# Patient Record
Sex: Male | Born: 1950
Health system: Southern US, Community
[De-identification: ages and names within clinical notes are randomized; demographics above are authoritative.]

## PROBLEM LIST (undated history)

## (undated) ENCOUNTER — Ambulatory Visit: Admission: EM | Payer: Federal, State, Local not specified - PPO | Source: Home / Self Care

## (undated) DIAGNOSIS — K219 Gastro-esophageal reflux disease without esophagitis: Secondary | ICD-10-CM

## (undated) DIAGNOSIS — S46011A Strain of muscle(s) and tendon(s) of the rotator cuff of right shoulder, initial encounter: Secondary | ICD-10-CM

## (undated) DIAGNOSIS — Z531 Procedure and treatment not carried out because of patient's decision for reasons of belief and group pressure: Secondary | ICD-10-CM

## (undated) DIAGNOSIS — J189 Pneumonia, unspecified organism: Secondary | ICD-10-CM

## (undated) DIAGNOSIS — E785 Hyperlipidemia, unspecified: Secondary | ICD-10-CM

## (undated) DIAGNOSIS — F32A Depression, unspecified: Secondary | ICD-10-CM

## (undated) DIAGNOSIS — Z9989 Dependence on other enabling machines and devices: Secondary | ICD-10-CM

## (undated) DIAGNOSIS — R079 Chest pain, unspecified: Secondary | ICD-10-CM

## (undated) DIAGNOSIS — M1990 Unspecified osteoarthritis, unspecified site: Secondary | ICD-10-CM

## (undated) DIAGNOSIS — D649 Anemia, unspecified: Secondary | ICD-10-CM

## (undated) DIAGNOSIS — M545 Low back pain, unspecified: Secondary | ICD-10-CM

## (undated) DIAGNOSIS — C61 Malignant neoplasm of prostate: Secondary | ICD-10-CM

## (undated) DIAGNOSIS — E119 Type 2 diabetes mellitus without complications: Secondary | ICD-10-CM

## (undated) DIAGNOSIS — G4733 Obstructive sleep apnea (adult) (pediatric): Secondary | ICD-10-CM

## (undated) DIAGNOSIS — F329 Major depressive disorder, single episode, unspecified: Secondary | ICD-10-CM

## (undated) DIAGNOSIS — I2510 Atherosclerotic heart disease of native coronary artery without angina pectoris: Secondary | ICD-10-CM

## (undated) DIAGNOSIS — M12811 Other specific arthropathies, not elsewhere classified, right shoulder: Secondary | ICD-10-CM

## (undated) DIAGNOSIS — D126 Benign neoplasm of colon, unspecified: Secondary | ICD-10-CM

## (undated) DIAGNOSIS — I209 Angina pectoris, unspecified: Secondary | ICD-10-CM

## (undated) DIAGNOSIS — IMO0001 Reserved for inherently not codable concepts without codable children: Secondary | ICD-10-CM

## (undated) DIAGNOSIS — S83232A Complex tear of medial meniscus, current injury, left knee, initial encounter: Secondary | ICD-10-CM

## (undated) DIAGNOSIS — I10 Essential (primary) hypertension: Secondary | ICD-10-CM

## (undated) DIAGNOSIS — G8929 Other chronic pain: Secondary | ICD-10-CM

## (undated) HISTORY — DX: Other specific arthropathies, not elsewhere classified, right shoulder: M12.811

## (undated) HISTORY — DX: Obstructive sleep apnea (adult) (pediatric): Z99.89

## (undated) HISTORY — PX: PILONIDAL CYST EXCISION: SHX744

## (undated) HISTORY — DX: Anemia, unspecified: D64.9

## (undated) HISTORY — DX: Hyperlipidemia, unspecified: E78.5

## (undated) HISTORY — PX: COLONOSCOPY W/ BIOPSIES AND POLYPECTOMY: SHX1376

## (undated) HISTORY — PX: ESOPHAGOGASTRODUODENOSCOPY ENDOSCOPY: SHX5814

## (undated) HISTORY — DX: Chest pain, unspecified: R07.9

## (undated) HISTORY — DX: Essential (primary) hypertension: I10

## (undated) HISTORY — DX: Major depressive disorder, single episode, unspecified: F32.9

## (undated) HISTORY — PX: CLOSED REDUCTION SHOULDER DISLOCATION: SUR242

## (undated) HISTORY — DX: Depression, unspecified: F32.A

## (undated) HISTORY — DX: Unspecified osteoarthritis, unspecified site: M19.90

## (undated) HISTORY — DX: Obstructive sleep apnea (adult) (pediatric): G47.33

## (undated) HISTORY — DX: Gastro-esophageal reflux disease without esophagitis: K21.9

## (undated) HISTORY — DX: Benign neoplasm of colon, unspecified: D12.6

## (undated) HISTORY — DX: Type 2 diabetes mellitus without complications: E11.9

---
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
## 1997-12-30 ENCOUNTER — Encounter: Admission: RE | Admit: 1997-12-30 | Discharge: 1998-03-30 | Payer: Self-pay | Admitting: Family Medicine

## 2000-06-22 ENCOUNTER — Encounter: Admission: RE | Admit: 2000-06-22 | Discharge: 2000-06-22 | Payer: Self-pay | Admitting: Surgery

## 2000-06-22 ENCOUNTER — Encounter: Payer: Self-pay | Admitting: Surgery

## 2000-06-24 ENCOUNTER — Ambulatory Visit (HOSPITAL_BASED_OUTPATIENT_CLINIC_OR_DEPARTMENT_OTHER): Admission: RE | Admit: 2000-06-24 | Discharge: 2000-06-24 | Payer: Self-pay | Admitting: Surgery

## 2000-07-02 ENCOUNTER — Emergency Department (HOSPITAL_COMMUNITY): Admission: EM | Admit: 2000-07-02 | Discharge: 2000-07-02 | Payer: Self-pay

## 2001-11-09 ENCOUNTER — Ambulatory Visit (HOSPITAL_COMMUNITY): Admission: RE | Admit: 2001-11-09 | Discharge: 2001-11-09 | Payer: Self-pay | Admitting: Gastroenterology

## 2002-04-05 HISTORY — PX: CARDIAC CATHETERIZATION: SHX172

## 2003-03-11 ENCOUNTER — Ambulatory Visit (HOSPITAL_COMMUNITY): Admission: RE | Admit: 2003-03-11 | Discharge: 2003-03-11 | Payer: Self-pay | Admitting: Nephrology

## 2003-03-13 ENCOUNTER — Ambulatory Visit (HOSPITAL_COMMUNITY): Admission: RE | Admit: 2003-03-13 | Discharge: 2003-03-13 | Payer: Self-pay | Admitting: Nephrology

## 2005-03-21 ENCOUNTER — Observation Stay (HOSPITAL_COMMUNITY): Admission: EM | Admit: 2005-03-21 | Discharge: 2005-03-23 | Payer: Self-pay | Admitting: Emergency Medicine

## 2008-08-23 ENCOUNTER — Ambulatory Visit: Payer: Self-pay | Admitting: Internal Medicine

## 2008-08-23 DIAGNOSIS — K219 Gastro-esophageal reflux disease without esophagitis: Secondary | ICD-10-CM | POA: Insufficient documentation

## 2008-08-23 DIAGNOSIS — N138 Other obstructive and reflux uropathy: Secondary | ICD-10-CM | POA: Insufficient documentation

## 2008-08-23 DIAGNOSIS — E785 Hyperlipidemia, unspecified: Secondary | ICD-10-CM | POA: Insufficient documentation

## 2008-08-23 DIAGNOSIS — F0280 Dementia in other diseases classified elsewhere without behavioral disturbance: Secondary | ICD-10-CM | POA: Insufficient documentation

## 2008-08-23 DIAGNOSIS — D649 Anemia, unspecified: Secondary | ICD-10-CM | POA: Insufficient documentation

## 2008-08-23 DIAGNOSIS — I10 Essential (primary) hypertension: Secondary | ICD-10-CM | POA: Insufficient documentation

## 2008-08-23 DIAGNOSIS — F329 Major depressive disorder, single episode, unspecified: Secondary | ICD-10-CM

## 2008-08-23 DIAGNOSIS — N401 Enlarged prostate with lower urinary tract symptoms: Secondary | ICD-10-CM | POA: Insufficient documentation

## 2008-08-23 DIAGNOSIS — M1990 Unspecified osteoarthritis, unspecified site: Secondary | ICD-10-CM | POA: Insufficient documentation

## 2008-08-23 DIAGNOSIS — E1159 Type 2 diabetes mellitus with other circulatory complications: Secondary | ICD-10-CM | POA: Insufficient documentation

## 2008-08-23 LAB — CONVERTED CEMR LAB
ALT: 31 units/L (ref 0–53)
AST: 22 units/L (ref 0–37)
Albumin: 3.9 g/dL (ref 3.5–5.2)
Alkaline Phosphatase: 82 units/L (ref 39–117)
BUN: 19 mg/dL (ref 6–23)
Basophils Absolute: 0.1 10*3/uL (ref 0.0–0.1)
Basophils Relative: 0.8 % (ref 0.0–3.0)
Bilirubin Urine: NEGATIVE
Bilirubin, Direct: 0.1 mg/dL (ref 0.0–0.3)
CO2: 31 meq/L (ref 19–32)
Calcium: 8.7 mg/dL (ref 8.4–10.5)
Chloride: 107 meq/L (ref 96–112)
Cholesterol, target level: 200 mg/dL
Cholesterol: 223 mg/dL — ABNORMAL HIGH (ref 0–200)
Creatinine, Ser: 0.9 mg/dL (ref 0.4–1.5)
Creatinine,U: 194 mg/dL
Direct LDL: 172.3 mg/dL
Eosinophils Absolute: 0.2 10*3/uL (ref 0.0–0.7)
Eosinophils Relative: 3.1 % (ref 0.0–5.0)
Folate: 13.2 ng/mL
GFR calc non Af Amer: 111.27 mL/min (ref >60)
Glucose, Bld: 177 mg/dL — ABNORMAL HIGH (ref 70–99)
HCT: 40.8 % (ref 39.0–52.0)
HDL goal, serum: 40 mg/dL
HDL: 38 mg/dL — ABNORMAL LOW (ref >39.00)
Hemoglobin, Urine: NEGATIVE
Hemoglobin: 14 g/dL (ref 13.0–17.0)
Hgb A1c MFr Bld: 7.1 % — ABNORMAL HIGH (ref 4.6–6.5)
Iron: 85 ug/dL (ref 42–165)
Ketones, ur: NEGATIVE mg/dL
LDL Goal: 100 mg/dL
Leukocytes, UA: NEGATIVE
Lymphocytes Relative: 37.5 % (ref 12.0–46.0)
Lymphs Abs: 2.4 10*3/uL (ref 0.7–4.0)
MCHC: 34.3 g/dL (ref 30.0–36.0)
MCV: 86 fL (ref 78.0–100.0)
Microalb Creat Ratio: 3.1 mg/g (ref 0.0–30.0)
Microalb, Ur: 0.6 mg/dL (ref 0.0–1.9)
Monocytes Absolute: 0.6 10*3/uL (ref 0.1–1.0)
Monocytes Relative: 9.5 % (ref 3.0–12.0)
Neutro Abs: 3.1 10*3/uL (ref 1.4–7.7)
Neutrophils Relative %: 49.1 % (ref 43.0–77.0)
Nitrite: NEGATIVE
PSA: 1.52 ng/mL (ref 0.10–4.00)
Platelets: 195 10*3/uL (ref 150.0–400.0)
Potassium: 3.9 meq/L (ref 3.5–5.1)
RBC: 4.75 M/uL (ref 4.22–5.81)
RDW: 12.8 % (ref 11.5–14.6)
Saturation Ratios: 21.8 % (ref 20.0–50.0)
Sodium: 142 meq/L (ref 135–145)
Specific Gravity, Urine: >=1.03 (ref 1.000–1.030)
Total Bilirubin: 0.5 mg/dL (ref 0.3–1.2)
Total CHOL/HDL Ratio: 6
Total Protein, Urine: NEGATIVE mg/dL
Total Protein: 7.1 g/dL (ref 6.0–8.3)
Transferrin: 277.9 mg/dL (ref 212.0–360.0)
Triglycerides: 122 mg/dL (ref 0.0–149.0)
Urine Glucose: NEGATIVE mg/dL
Urobilinogen, UA: 0.2 (ref 0.0–1.0)
VLDL: 24.4 mg/dL (ref 0.0–40.0)
Vitamin B-12: 597 pg/mL (ref 211–911)
WBC: 6.4 10*3/uL (ref 4.5–10.5)
pH: 5 (ref 5.0–8.0)

## 2008-08-26 ENCOUNTER — Encounter: Payer: Self-pay | Admitting: Internal Medicine

## 2008-09-10 ENCOUNTER — Ambulatory Visit: Payer: Self-pay | Admitting: Internal Medicine

## 2008-11-11 ENCOUNTER — Ambulatory Visit: Payer: Self-pay | Admitting: Internal Medicine

## 2008-11-11 ENCOUNTER — Telehealth: Payer: Self-pay | Admitting: Internal Medicine

## 2008-11-11 DIAGNOSIS — N529 Male erectile dysfunction, unspecified: Secondary | ICD-10-CM | POA: Insufficient documentation

## 2008-11-11 DIAGNOSIS — E8881 Metabolic syndrome: Secondary | ICD-10-CM | POA: Insufficient documentation

## 2009-01-08 ENCOUNTER — Telehealth: Payer: Self-pay | Admitting: Internal Medicine

## 2009-02-04 ENCOUNTER — Telehealth: Payer: Self-pay | Admitting: Internal Medicine

## 2009-03-17 ENCOUNTER — Ambulatory Visit: Payer: Self-pay | Admitting: Internal Medicine

## 2009-03-31 ENCOUNTER — Telehealth (INDEPENDENT_AMBULATORY_CARE_PROVIDER_SITE_OTHER): Payer: Self-pay | Admitting: *Deleted

## 2009-04-05 DIAGNOSIS — D126 Benign neoplasm of colon, unspecified: Secondary | ICD-10-CM

## 2009-04-05 HISTORY — DX: Benign neoplasm of colon, unspecified: D12.6

## 2009-05-09 ENCOUNTER — Telehealth (INDEPENDENT_AMBULATORY_CARE_PROVIDER_SITE_OTHER): Payer: Self-pay | Admitting: *Deleted

## 2009-05-20 ENCOUNTER — Ambulatory Visit: Payer: Self-pay | Admitting: Internal Medicine

## 2009-05-20 LAB — CONVERTED CEMR LAB
ALT: 30 units/L (ref 0–53)
AST: 21 units/L (ref 0–37)
Albumin: 3.9 g/dL (ref 3.5–5.2)
Alkaline Phosphatase: 75 units/L (ref 39–117)
BUN: 10 mg/dL (ref 6–23)
Basophils Absolute: 0.1 10*3/uL (ref 0.0–0.1)
Basophils Relative: 0.9 % (ref 0.0–3.0)
Bilirubin Urine: NEGATIVE
Bilirubin, Direct: 0.2 mg/dL (ref 0.0–0.3)
CO2: 33 meq/L — ABNORMAL HIGH (ref 19–32)
Calcium: 9.1 mg/dL (ref 8.4–10.5)
Chloride: 103 meq/L (ref 96–112)
Cholesterol: 215 mg/dL — ABNORMAL HIGH (ref 0–200)
Creatinine, Ser: 1 mg/dL (ref 0.4–1.5)
Direct LDL: 162.3 mg/dL
Eosinophils Absolute: 0.3 10*3/uL (ref 0.0–0.7)
Eosinophils Relative: 3.9 % (ref 0.0–5.0)
GFR calc non Af Amer: 98.28 mL/min (ref >60)
Glucose, Bld: 88 mg/dL (ref 70–99)
HCT: 48.1 % (ref 39.0–52.0)
HDL: 43 mg/dL (ref >39.00)
Hemoglobin: 15.5 g/dL (ref 13.0–17.0)
Hgb A1c MFr Bld: 6.8 % — ABNORMAL HIGH (ref 4.6–6.5)
Ketones, ur: NEGATIVE mg/dL
Leukocytes, UA: NEGATIVE
Lymphocytes Relative: 41.5 % (ref 12.0–46.0)
Lymphs Abs: 2.8 10*3/uL (ref 0.7–4.0)
MCHC: 32.2 g/dL (ref 30.0–36.0)
MCV: 88 fL (ref 78.0–100.0)
Monocytes Absolute: 0.8 10*3/uL (ref 0.1–1.0)
Monocytes Relative: 12.3 % — ABNORMAL HIGH (ref 3.0–12.0)
Neutro Abs: 2.8 10*3/uL (ref 1.4–7.7)
Neutrophils Relative %: 41.4 % — ABNORMAL LOW (ref 43.0–77.0)
Nitrite: NEGATIVE
Platelets: 195 10*3/uL (ref 150.0–400.0)
Potassium: 4.5 meq/L (ref 3.5–5.1)
RBC: 5.46 M/uL (ref 4.22–5.81)
RDW: 12.7 % (ref 11.5–14.6)
Sodium: 141 meq/L (ref 135–145)
Specific Gravity, Urine: 1.025 (ref 1.000–1.030)
TSH: 1.68 microintl units/mL (ref 0.35–5.50)
Testosterone: 408.15 ng/dL (ref 350.00–890.00)
Total Bilirubin: 0.6 mg/dL (ref 0.3–1.2)
Total CHOL/HDL Ratio: 5
Total Protein, Urine: NEGATIVE mg/dL
Total Protein: 7.8 g/dL (ref 6.0–8.3)
Triglycerides: 91 mg/dL (ref 0.0–149.0)
Urine Glucose: NEGATIVE mg/dL
Urobilinogen, UA: 0.2 (ref 0.0–1.0)
VLDL: 18.2 mg/dL (ref 0.0–40.0)
WBC: 6.8 10*3/uL (ref 4.5–10.5)
pH: 5.5 (ref 5.0–8.0)

## 2009-07-31 ENCOUNTER — Telehealth: Payer: Self-pay | Admitting: Internal Medicine

## 2009-10-22 LAB — HM DIABETES EYE EXAM: HM Diabetic Eye Exam: NORMAL

## 2009-10-23 ENCOUNTER — Ambulatory Visit: Payer: Self-pay | Admitting: Internal Medicine

## 2009-10-23 ENCOUNTER — Telehealth: Payer: Self-pay | Admitting: Internal Medicine

## 2009-10-23 DIAGNOSIS — R9431 Abnormal electrocardiogram [ECG] [EKG]: Secondary | ICD-10-CM | POA: Insufficient documentation

## 2009-10-23 LAB — CONVERTED CEMR LAB
ALT: 21 units/L (ref 0–53)
AST: 21 units/L (ref 0–37)
Albumin: 3.8 g/dL (ref 3.5–5.2)
Alkaline Phosphatase: 63 units/L (ref 39–117)
BUN: 11 mg/dL (ref 6–23)
Basophils Absolute: 0.1 10*3/uL (ref 0.0–0.1)
Basophils Relative: 1 % (ref 0.0–3.0)
Bilirubin Urine: NEGATIVE
Bilirubin, Direct: 0.1 mg/dL (ref 0.0–0.3)
CO2: 32 meq/L (ref 19–32)
Calcium: 8.8 mg/dL (ref 8.4–10.5)
Chloride: 107 meq/L (ref 96–112)
Cholesterol: 204 mg/dL — ABNORMAL HIGH (ref 0–200)
Creatinine, Ser: 0.9 mg/dL (ref 0.4–1.5)
Direct LDL: 159.1 mg/dL
Eosinophils Absolute: 0.2 10*3/uL (ref 0.0–0.7)
Eosinophils Relative: 4.2 % (ref 0.0–5.0)
GFR calc non Af Amer: 110.82 mL/min (ref >60)
Glucose, Bld: 128 mg/dL — ABNORMAL HIGH (ref 70–99)
HCT: 40.8 % (ref 39.0–52.0)
HDL: 37.6 mg/dL — ABNORMAL LOW (ref >39.00)
Hemoglobin, Urine: NEGATIVE
Hemoglobin: 13.5 g/dL (ref 13.0–17.0)
Hgb A1c MFr Bld: 7 % — ABNORMAL HIGH (ref 4.6–6.5)
Ketones, ur: NEGATIVE mg/dL
Leukocytes, UA: NEGATIVE
Lymphocytes Relative: 40.9 % (ref 12.0–46.0)
Lymphs Abs: 2.3 10*3/uL (ref 0.7–4.0)
MCHC: 33.1 g/dL (ref 30.0–36.0)
MCV: 87.7 fL (ref 78.0–100.0)
Monocytes Absolute: 0.6 10*3/uL (ref 0.1–1.0)
Monocytes Relative: 9.9 % (ref 3.0–12.0)
Neutro Abs: 2.5 10*3/uL (ref 1.4–7.7)
Neutrophils Relative %: 44 % (ref 43.0–77.0)
Nitrite: NEGATIVE
PSA: 1.51 ng/mL (ref 0.10–4.00)
Platelets: 204 10*3/uL (ref 150.0–400.0)
Potassium: 4.3 meq/L (ref 3.5–5.1)
RBC: 4.65 M/uL (ref 4.22–5.81)
RDW: 14.6 % (ref 11.5–14.6)
Sodium: 143 meq/L (ref 135–145)
Specific Gravity, Urine: 1.025 (ref 1.000–1.030)
TSH: 0.98 microintl units/mL (ref 0.35–5.50)
Total Bilirubin: 0.7 mg/dL (ref 0.3–1.2)
Total CHOL/HDL Ratio: 5
Total Protein, Urine: NEGATIVE mg/dL
Total Protein: 6.9 g/dL (ref 6.0–8.3)
Triglycerides: 80 mg/dL (ref 0.0–149.0)
Urine Glucose: NEGATIVE mg/dL
Urobilinogen, UA: 0.2 (ref 0.0–1.0)
VLDL: 16 mg/dL (ref 0.0–40.0)
WBC: 5.6 10*3/uL (ref 4.5–10.5)
pH: 7 (ref 5.0–8.0)

## 2009-10-28 ENCOUNTER — Encounter (INDEPENDENT_AMBULATORY_CARE_PROVIDER_SITE_OTHER): Payer: Self-pay | Admitting: *Deleted

## 2009-11-18 ENCOUNTER — Ambulatory Visit: Payer: Self-pay | Admitting: Cardiovascular Disease

## 2009-11-25 ENCOUNTER — Ambulatory Visit (HOSPITAL_COMMUNITY): Admission: RE | Admit: 2009-11-25 | Discharge: 2009-11-25 | Payer: Self-pay | Admitting: Cardiovascular Disease

## 2009-11-25 ENCOUNTER — Ambulatory Visit: Payer: Self-pay | Admitting: Cardiovascular Disease

## 2009-11-26 ENCOUNTER — Encounter (INDEPENDENT_AMBULATORY_CARE_PROVIDER_SITE_OTHER): Payer: Self-pay | Admitting: *Deleted

## 2009-12-01 ENCOUNTER — Encounter (INDEPENDENT_AMBULATORY_CARE_PROVIDER_SITE_OTHER): Payer: Self-pay | Admitting: *Deleted

## 2009-12-01 ENCOUNTER — Ambulatory Visit: Payer: Self-pay | Admitting: Gastroenterology

## 2009-12-15 ENCOUNTER — Ambulatory Visit: Payer: Self-pay | Admitting: Gastroenterology

## 2009-12-15 LAB — HM COLONOSCOPY

## 2009-12-18 ENCOUNTER — Encounter: Payer: Self-pay | Admitting: Gastroenterology

## 2010-01-09 ENCOUNTER — Telehealth: Payer: Self-pay | Admitting: Internal Medicine

## 2010-01-09 ENCOUNTER — Encounter: Payer: Self-pay | Admitting: Internal Medicine

## 2010-01-15 ENCOUNTER — Telehealth: Payer: Self-pay | Admitting: Internal Medicine

## 2010-01-16 ENCOUNTER — Ambulatory Visit: Payer: Self-pay | Admitting: Internal Medicine

## 2010-01-16 ENCOUNTER — Ambulatory Visit: Payer: Self-pay | Admitting: Cardiology

## 2010-01-16 ENCOUNTER — Telehealth: Payer: Self-pay | Admitting: Internal Medicine

## 2010-01-16 DIAGNOSIS — S0990XA Unspecified injury of head, initial encounter: Secondary | ICD-10-CM | POA: Insufficient documentation

## 2010-01-16 LAB — CONVERTED CEMR LAB
ALT: 21 units/L (ref 0–53)
AST: 17 units/L (ref 0–37)
Albumin: 3.8 g/dL (ref 3.5–5.2)
Alkaline Phosphatase: 92 units/L (ref 39–117)
BUN: 14 mg/dL (ref 6–23)
Bilirubin, Direct: 0 mg/dL (ref 0.0–0.3)
CO2: 28 meq/L (ref 19–32)
Calcium: 9.3 mg/dL (ref 8.4–10.5)
Chloride: 104 meq/L (ref 96–112)
Creatinine, Ser: 1 mg/dL (ref 0.4–1.5)
GFR calc non Af Amer: 98.06 mL/min (ref >60)
Glucose, Bld: 158 mg/dL — ABNORMAL HIGH (ref 70–99)
Hgb A1c MFr Bld: 7.1 % — ABNORMAL HIGH (ref 4.6–6.5)
Potassium: 4.3 meq/L (ref 3.5–5.1)
Sed Rate: 8 mm/hr (ref 0–22)
Sodium: 140 meq/L (ref 135–145)
Total Bilirubin: 0.5 mg/dL (ref 0.3–1.2)
Total Protein: 7 g/dL (ref 6.0–8.3)

## 2010-01-29 ENCOUNTER — Encounter: Payer: Self-pay | Admitting: Internal Medicine

## 2010-03-16 ENCOUNTER — Telehealth (INDEPENDENT_AMBULATORY_CARE_PROVIDER_SITE_OTHER): Payer: Self-pay | Admitting: *Deleted

## 2010-04-21 ENCOUNTER — Encounter (INDEPENDENT_AMBULATORY_CARE_PROVIDER_SITE_OTHER): Payer: Self-pay | Admitting: *Deleted

## 2010-04-24 ENCOUNTER — Ambulatory Visit
Admission: RE | Admit: 2010-04-24 | Discharge: 2010-04-24 | Payer: Self-pay | Source: Home / Self Care | Attending: Internal Medicine | Admitting: Internal Medicine

## 2010-04-24 ENCOUNTER — Encounter: Payer: Self-pay | Admitting: Internal Medicine

## 2010-04-24 ENCOUNTER — Telehealth: Payer: Self-pay | Admitting: Internal Medicine

## 2010-04-24 ENCOUNTER — Other Ambulatory Visit: Payer: Self-pay | Admitting: Internal Medicine

## 2010-04-24 LAB — BASIC METABOLIC PANEL
BUN: 14 mg/dL (ref 6–23)
CO2: 28 mEq/L (ref 19–32)
Calcium: 9.1 mg/dL (ref 8.4–10.5)
Chloride: 105 mEq/L (ref 96–112)
Creatinine, Ser: 0.9 mg/dL (ref 0.4–1.5)
GFR: 110.63 mL/min (ref >60.00)
Glucose, Bld: 85 mg/dL (ref 70–99)
Potassium: 4.2 mEq/L (ref 3.5–5.1)
Sodium: 141 mEq/L (ref 135–145)

## 2010-04-24 LAB — LDL CHOLESTEROL, DIRECT: Direct LDL: 163.1 mg/dL

## 2010-04-24 LAB — LIPID PANEL
Cholesterol: 208 mg/dL — ABNORMAL HIGH (ref 0–200)
HDL: 41.9 mg/dL (ref >39.00)
Total CHOL/HDL Ratio: 5
Triglycerides: 68 mg/dL (ref 0.0–149.0)
VLDL: 13.6 mg/dL (ref 0.0–40.0)

## 2010-04-24 LAB — HM DIABETES FOOT EXAM

## 2010-04-24 LAB — CBC WITH DIFFERENTIAL/PLATELET
Basophils Absolute: 0.1 10*3/uL (ref 0.0–0.1)
Basophils Relative: 1.2 % (ref 0.0–3.0)
Eosinophils Absolute: 0.2 10*3/uL (ref 0.0–0.7)
Eosinophils Relative: 3 % (ref 0.0–5.0)
HCT: 45.4 % (ref 39.0–52.0)
Hemoglobin: 15.3 g/dL (ref 13.0–17.0)
Lymphocytes Relative: 39.5 % (ref 12.0–46.0)
Lymphs Abs: 2.7 10*3/uL (ref 0.7–4.0)
MCHC: 33.7 g/dL (ref 30.0–36.0)
MCV: 86.6 fl (ref 78.0–100.0)
Monocytes Absolute: 0.7 10*3/uL (ref 0.1–1.0)
Monocytes Relative: 10.2 % (ref 3.0–12.0)
Neutro Abs: 3.2 10*3/uL (ref 1.4–7.7)
Neutrophils Relative %: 46.1 % (ref 43.0–77.0)
Platelets: 216 10*3/uL (ref 150.0–400.0)
RBC: 5.24 Mil/uL (ref 4.22–5.81)
RDW: 14.5 % (ref 11.5–14.6)
WBC: 6.9 10*3/uL (ref 4.5–10.5)

## 2010-04-24 LAB — HEPATIC FUNCTION PANEL
ALT: 25 U/L (ref 0–53)
AST: 20 U/L (ref 0–37)
Albumin: 3.9 g/dL (ref 3.5–5.2)
Alkaline Phosphatase: 80 U/L (ref 39–117)
Bilirubin, Direct: 0.1 mg/dL (ref 0.0–0.3)
Total Bilirubin: 0.5 mg/dL (ref 0.3–1.2)
Total Protein: 7.2 g/dL (ref 6.0–8.3)

## 2010-04-24 LAB — HEMOGLOBIN A1C: Hgb A1c MFr Bld: 7 % — ABNORMAL HIGH (ref 4.6–6.5)

## 2010-04-24 LAB — TSH: TSH: 1.15 u[IU]/mL (ref 0.35–5.50)

## 2010-05-05 NOTE — Progress Notes (Signed)
Summary: [name]  Phone Note Call from Patient Call back at Home Phone [phone]   Summary of Call: Patient c/o h/a's since a fall where he hit his head. Fall was months ago per wife. Pt scheduled for office visit tomorrow for eval.  Initial call taken by: Lamar Sprinkles, CMA,  January 15, 2010 4:19 PM

## 2010-05-05 NOTE — Assessment & Plan Note (Signed)
Summary: h/a's since fall mths ago/SD   Vital Signs:  Patient profile:   60 year old male Height:      70.5 inches Weight:      255 pounds O2 Sat:      95 % on Room air Temp:     97.2 degrees F oral Pulse rate:   63 / minute Pulse rhythm:   regular Resp:     16 per minute BP sitting:   130 / 82  (left arm) Cuff size:   large  Vitals Entered By: Rock Nephew CMA (January 16, 2010 9:05 AM)  O2 Flow:  Room air CC: pt c/o headache blurred vision since falling  Does patient need assistance? Functional Status Self care Ambulation Normal   Primary Care Provider:  Etta Grandchild MD  CC:  pt c/o headache blurred vision since falling.  History of Present Illness: He returns with a complaint that he had a head injury about 6 weeks ago when he was helping a friend clean some floors and he slipped and had a ground level fall and struck the back of his head. He did not lose consciousness but he has had intermittent headaches and dizziness since then. His headache is bilateral/posterior.  Preventive Screening-Counseling & Management  Alcohol-Tobacco     Alcohol drinks/day: <1     Alcohol type: beer     >5/day in last 3 mos: no     Smoking Status: never     Tobacco Counseling: not indicated; no tobacco use  Clinical Review Panels:  Prevention   Last Colonoscopy:  DONE (12/15/2009)   Last PSA:  1.51 (10/23/2009)  Immunizations   Last Tetanus Booster:  Tdap (08/23/2008)   Last Pneumovax:  Pneumovax (08/23/2008)  Lipid Management   Cholesterol:  204 (10/23/2009)   HDL (good cholesterol):  37.60 (10/23/2009)  Diabetes Management   HgBA1C:  7.0 (10/23/2009)   Creatinine:  0.9 (10/23/2009)   Last Dilated Eye Exam:  normal (10/22/2009)   Last Foot Exam:  yes (01/16/2010)   Last Pneumovax:  Pneumovax (08/23/2008)  CBC   WBC:  5.6 (10/23/2009)   RBC:  4.65 (10/23/2009)   Hgb:  13.5 (10/23/2009)   Hct:  40.8 (10/23/2009)   Platelets:  204.0 (10/23/2009)   MCV  87.7  (10/23/2009)   MCHC  33.1 (10/23/2009)   RDW  14.6 (10/23/2009)   PMN:  44.0 (10/23/2009)   Lymphs:  40.9 (10/23/2009)   Monos:  9.9 (10/23/2009)   Eosinophils:  4.2 (10/23/2009)   Basophil:  1.0 (10/23/2009)  Complete Metabolic Panel   Glucose:  128 (10/23/2009)   Sodium:  143 (10/23/2009)   Potassium:  4.3 (10/23/2009)   Chloride:  107 (10/23/2009)   CO2:  32 (10/23/2009)   BUN:  11 (10/23/2009)   Creatinine:  0.9 (10/23/2009)   Albumin:  3.8 (10/23/2009)   Total Protein:  6.9 (10/23/2009)   Calcium:  8.8 (10/23/2009)   Total Bili:  0.7 (10/23/2009)   Alk Phos:  63 (10/23/2009)   SGPT (ALT):  21 (10/23/2009)   SGOT (AST):  21 (10/23/2009)   Medications Prior to Update: 1)  Diovan Hct 320-25 Mg Tabs (Valsartan-Hydrochlorothiazide) .... Take 1 Tablet By Mouth Once A Day 2)  Tekturna 300 Mg Tabs (Aliskiren Fumarate) .... Once Daily For High Blood Pressure 3)  Glimepiride 4 Mg Tabs (Glimepiride) .... Take 1 Tablet By Mouth Once A Day 4)  Aspirin 81 Mg  Tabs (Aspirin) .Marland Kitchen.. 1 Tab Weekly 5)  Metformin Hcl 500 Mg  Tabs (Metformin Hcl) .... One By Mouth Three Times A Day For Diabetes 6)  Prilosec Otc 20 Mg Tbec (Omeprazole Magnesium) .... Once Daily For Heartburn 7)  Bupropion Hcl 150 Mg Xr24h-Tab (Bupropion Hcl) .... Once Daily For Depression Pt States He Takes When He Remembers  Current Medications (verified): 1)  Diovan Hct 320-25 Mg Tabs (Valsartan-Hydrochlorothiazide) .... Take 1 Tablet By Mouth Once A Day 2)  Tekturna 300 Mg Tabs (Aliskiren Fumarate) .... Once Daily For High Blood Pressure 3)  Glimepiride 4 Mg Tabs (Glimepiride) .... Take 1 Tablet By Mouth Once A Day 4)  Aspirin 81 Mg  Tabs (Aspirin) .Marland Kitchen.. 1 Tab Weekly 5)  Metformin Hcl 500 Mg Tabs (Metformin Hcl) .... One By Mouth Three Times A Day For Diabetes 6)  Prilosec Otc 20 Mg Tbec (Omeprazole Magnesium) .... Once Daily For Heartburn 7)  Bupropion Hcl 150 Mg Xr24h-Tab (Bupropion Hcl) .... Once Daily For Depression Pt  States He Takes When He Remembers  Allergies (verified): 1)  ! * Statins  Past History:  Past Medical History: Last updated: 08/23/2008 Anemia-NOS Diabetes mellitus, type II GERD Hyperlipidemia Hypertension Osteoarthritis Depression  Past Surgical History: Last updated: 11/18/2009 pilonidal  cyst removal shoulder surgery right  Family History: Last updated: 11/18/2009 Family History of Alcoholism/Addiction Family History of CAD Male 1st degree relative <50 Family History Diabetes 1st degree relative Family History of Stroke F 1st degree relative <60 Family History of Sudden Death  Mother alive, healthy Father deceased age 4 from gangrene arm 4 sisters all alive, no CAD 2 brothers alive, no CAD 2 uncles in their 65s with CAD, CABG  Social History: Last updated: 11/18/2009 Retired-Worked for [employer] of [employer] Married, 4 children Smoked for 10 years, none since 1985.  Alcohol use-occasional Drug use-no Regular exercise-no  Risk Factors: Alcohol Use: <1 (01/16/2010) >5 drinks/d w/in last 3 months: no (01/16/2010) Exercise: no (08/23/2008)  Risk Factors: Smoking Status: never (01/16/2010)  Family History: Reviewed history from 11/18/2009 and no changes required. Family History of Alcoholism/Addiction Family History of CAD Male 1st degree relative <50 Family History Diabetes 1st degree relative Family History of Stroke F 1st degree relative <60 Family History of Sudden Death  Mother alive, healthy Father deceased age 40 from gangrene arm 4 sisters all alive, no CAD 2 brothers alive, no CAD 2 uncles in their 73s with CAD, CABG  Social History: Reviewed history from 11/18/2009 and no changes required. Retired-Worked for city of [place] Married, 4 children Smoked for 10 years, none since 1985.  Alcohol use-occasional Drug use-no Regular exercise-no  Review of Systems       The patient complains of weight gain.  The patient denies anorexia,  fever, weight loss, chest pain, dyspnea on exertion, peripheral edema, prolonged cough, hemoptysis, abdominal pain, hematuria, suspicious skin lesions, transient blindness, difficulty walking, depression, and abnormal bleeding.   Neuro:  Complains of headaches and memory loss; denies brief paralysis, difficulty with concentration, disturbances in coordination, inability to speak, numbness, poor balance, seizures, sensation of room spinning, tingling, tremors, visual disturbances, and weakness. Endo:  Denies cold intolerance, excessive hunger, excessive thirst, excessive urination, heat intolerance, polyuria, and weight change.  Physical Exam  General:  alert, well-developed, well-nourished, well-hydrated, cooperative to examination, good hygiene, and overweight-appearing.   Head:  normocephalic, atraumatic, no abnormalities observed, and no abnormalities palpated.   Eyes:  vision grossly intact, pupils equal, pupils round, pupils reactive to light, pupils react to accomodation, no injection, and no nystagmus.   Ears:  R ear  normal and L ear normal.   Nose:  External nasal examination shows no deformity or inflammation. Nasal mucosa are pink and moist without lesions or exudates. Mouth:  Oral mucosa and oropharynx without lesions or exudates.  Teeth in good repair. Neck:  supple, full ROM, no masses, no thyromegaly, no JVD, normal carotid upstroke, no carotid bruits, no cervical lymphadenopathy, and no neck tenderness.   Chest Wall:  No deformities, masses, tenderness or gynecomastia noted. Lungs:  normal respiratory effort, no intercostal retractions, no accessory muscle use, normal breath sounds, no dullness, no fremitus, no crackles, and no wheezes.   Heart:  normal rate, regular rhythm, no murmur, no gallop, no rub, and no JVD.   Abdomen:  soft, non-tender, normal bowel sounds, no distention, no masses, no guarding, no rigidity, no rebound tenderness, no abdominal hernia, no inguinal hernia, no  hepatomegaly, and no splenomegaly.   Msk:  No deformity or scoliosis noted of thoracic or lumbar spine.   Pulses:  R and L carotid,radial,femoral,dorsalis pedis and posterior tibial pulses are full and equal bilaterally Extremities:  No clubbing, cyanosis, edema, or deformity noted with normal full range of motion of all joints.   Neurologic:  No cranial nerve deficits noted. Station and gait are normal. Plantar reflexes are down-going bilaterally. DTRs are symmetrical throughout. Sensory, motor and coordinative functions appear intact. Skin:  turgor normal, color normal, no rashes, no suspicious lesions, no ecchymoses, no petechiae, and no purpura.   Cervical Nodes:  no anterior cervical adenopathy and no posterior cervical adenopathy.   Psych:  Oriented X3, memory intact for recent and remote, normally interactive, good eye contact, not anxious appearing, not depressed appearing, not agitated, and not suicidal.    Diabetes Management Exam:    Foot Exam (with socks and/or shoes not present):       Sensory-Pinprick/Light touch:          Left medial foot (L-4): normal          Left dorsal foot (L-5): normal          Left lateral foot (S-1): normal          Right medial foot (L-4): normal          Right dorsal foot (L-5): normal          Right lateral foot (S-1): normal       Sensory-Monofilament:          Left foot: normal          Right foot: normal       Inspection:          Left foot: normal          Right foot: normal       Nails:          Left foot: normal          Right foot: normal   Impression & Recommendations:  Problem # 1:  HEAD TRAUMA, CLOSED (ICD-959.01) Assessment New will get a CT scan done to look for CVA, SDH, ICH, skull fracture, etc Orders: Venipuncture (54098) TLB-BMP (Basic Metabolic Panel-BMET) (80048-METABOL) TLB-Hepatic/Liver Function Pnl (80076-HEPATIC) TLB-A1C / Hgb A1C (Glycohemoglobin) (83036-A1C) TLB-Sedimentation Rate (ESR) (85652-ESR)  Problem #  2:  HYPERTENSION (ICD-401.9) Assessment: Improved  His updated medication list for this problem includes:    Diovan Hct 320-25 Mg Tabs (Valsartan-hydrochlorothiazide) .Marland Kitchen... Take 1 tablet by mouth once a day    Tekturna 300 Mg Tabs (Aliskiren fumarate) ..... Once daily for high blood pressure  Orders: Venipuncture (27253) TLB-BMP (Basic Metabolic Panel-BMET) (80048-METABOL) TLB-Hepatic/Liver Function Pnl (80076-HEPATIC) TLB-A1C / Hgb A1C (Glycohemoglobin) (83036-A1C) TLB-Sedimentation Rate (ESR) (85652-ESR)  BP today: 130/82 Prior BP: 142/86 (11/18/2009)  Prior 10 Yr Risk Heart Disease: Not enough information (08/23/2008)  Labs Reviewed: K+: 4.3 (10/23/2009) Creat: : 0.9 (10/23/2009)   Chol: 204 (10/23/2009)   HDL: 37.60 (10/23/2009)   TG: 80.0 (10/23/2009)  Problem # 3:  DIABETES MELLITUS, TYPE II (ICD-250.00) Assessment: Unchanged  His updated medication list for this problem includes:    Diovan Hct 320-25 Mg Tabs (Valsartan-hydrochlorothiazide) .Marland Kitchen... Take 1 tablet by mouth once a day    Glimepiride 4 Mg Tabs (Glimepiride) .Marland Kitchen... Take 1 tablet by mouth once a day    Aspirin 81 Mg Tabs (Aspirin) .Marland Kitchen... 1 tab weekly    Metformin Hcl 500 Mg Tabs (Metformin hcl) ..... One by mouth three times a day for diabetes  Orders: Venipuncture (66440) TLB-BMP (Basic Metabolic Panel-BMET) (80048-METABOL) TLB-Hepatic/Liver Function Pnl (80076-HEPATIC) TLB-A1C / Hgb A1C (Glycohemoglobin) (83036-A1C) TLB-Sedimentation Rate (ESR) (85652-ESR)  Labs Reviewed: Creat: 0.9 (10/23/2009)     Last Eye Exam: normal (10/22/2009) Reviewed HgBA1c results: 7.0 (10/23/2009)  6.8 (05/20/2009)  Problem # 4:  HYPERLIPIDEMIA (ICD-272.4) Assessment: Unchanged  Labs Reviewed: SGOT: 21 (10/23/2009)   SGPT: 21 (10/23/2009)  Lipid Goals: Chol Goal: 200 (08/23/2008)   HDL Goal: 40 (08/23/2008)   LDL Goal: 100 (08/23/2008)   TG Goal: 150 (08/23/2008)  Prior 10 Yr Risk Heart Disease: Not enough information  (08/23/2008)   HDL:37.60 (10/23/2009), 43.00 (05/20/2009)  Chol:204 (10/23/2009), 215 (05/20/2009)  Trig:80.0 (10/23/2009), 91.0 (05/20/2009)  Complete Medication List: 1)  Diovan Hct 320-25 Mg Tabs (Valsartan-hydrochlorothiazide) .... Take 1 tablet by mouth once a day 2)  Tekturna 300 Mg Tabs (Aliskiren fumarate) .... Once daily for high blood pressure 3)  Glimepiride 4 Mg Tabs (Glimepiride) .... Take 1 tablet by mouth once a day 4)  Aspirin 81 Mg Tabs (Aspirin) .Marland Kitchen.. 1 tab weekly 5)  Metformin Hcl 500 Mg Tabs (Metformin hcl) .... One by mouth three times a day for diabetes 6)  Prilosec Otc 20 Mg Tbec (Omeprazole magnesium) .... Once daily for heartburn 7)  Bupropion Hcl 150 Mg Xr24h-tab (Bupropion hcl) .... Once daily for depression pt states he takes when he remembers  Patient Instructions: 1)  Please schedule a follow-up appointment in 2 months. 2)  It is important that you exercise regularly at least 20 minutes 5 times a week. If you develop chest pain, have severe difficulty breathing, or feel very tired , stop exercising immediately and seek medical attention. 3)  You need to lose weight. Consider a lower calorie diet and regular exercise.  4)  Check your blood sugars regularly. If your readings are usually above 200 or below 70 you should contact our office. 5)  It is important that your Diabetic A1c level is checked every 3 months. 6)  See your eye doctor yearly to check for diabetic eye damage. 7)  Check your feet each night for sore areas, calluses or signs of infection. 8)  Check your Blood Pressure regularly. If it is above 130/80: you should make an appointment.

## 2010-05-05 NOTE — Letter (Signed)
Summary: Results Letter  [facility]  [address] [place], [place] [address]   Phone: [phone]  Fax: [phone]        December 18, 2009 MRN: [id number]    GLADE STRAUSSER [address] [place], [place]  [address]    Dear Mr. NYGARD,   At least one of the polyps removed during your recent procedure was proven to be adenomatous.  These are pre-cancerous polyps that may have grown into cancers if they had not been removed.  Based on current nationally recognized surveillance guidelines, I recommend that you have a repeat colonoscopy in 5 years.  We will therefore put your information in our reminder system and will contact you in 5 years to schedule a repeat procedure.  Please call if you have any questions or concerns.       Sincerely,  Rachael Fee MD  This letter has been electronically signed by your physician.  Appended Document: Results Letter letter mailed

## 2010-05-05 NOTE — Letter (Signed)
Summary: Generic Letter   [facility]  [address] [place], [place] [address]   Phone: [phone]  Fax: [phone]       01/09/2010  KRISTIAN MOGG [address] [place], [place]  [address]  Dear Mr. GLACE,   This letter is written at your request and to document that your overall health is good.        Sincerely,   Sanda Linger MD

## 2010-05-05 NOTE — Assessment & Plan Note (Signed)
Summary: 2 MOS F/U // #/CD  - rs'd from bmp list/cd   Vital Signs:  Patient profile:   60 year old male Height:      70.5 inches Weight:      257 pounds BMI:     36.49 O2 Sat:      98 % on Room air Temp:     96.8 degrees F oral Pulse rate:   66 / minute Pulse rhythm:   regular Resp:     16 per minute BP sitting:   130 / 80  (left arm) Cuff size:   large  Vitals Entered By: Rock Nephew CMA (May 20, 2009 8:07 AM)  Nutrition Counseling: Patient's BMI is greater than 25 and therefore counseled on weight management options.  O2 Flow:  Room air  Primary Care Provider:  Etta Grandchild MD   History of Present Illness: He returns for f/up and wants to see a Psychiatrist due to apathy and anhedonia.  Hypertension History:      He denies headache, chest pain, palpitations, dyspnea with exertion, orthopnea, peripheral edema, visual symptoms, neurologic problems, syncope, and side effects from treatment.  He notes no problems with any antihypertensive medication side effects.        Positive major cardiovascular risk factors include male age 69 years old or older, diabetes, hyperlipidemia, hypertension, and family history for ischemic heart disease (males less than 38 years old).  Negative major cardiovascular risk factors include non-tobacco-user status.        Further assessment for target organ damage reveals no history of ASHD, cardiac end-organ damage (CHF/LVH), stroke/TIA, peripheral vascular disease, renal insufficiency, or hypertensive retinopathy.    Lipid Management History:      Positive NCEP/ATP III risk factors include male age 84 years old or older, diabetes, HDL cholesterol less than 40, family history for ischemic heart disease (males less than 60 years old), and hypertension.  Negative NCEP/ATP III risk factors include non-tobacco-user status, no ASHD (atherosclerotic heart disease), no prior stroke/TIA, no peripheral vascular disease, and no history of aortic  aneurysm.        The patient states that he knows about the "Therapeutic Lifestyle Change" diet.  His compliance with the TLC diet is not at all.  The patient expresses understanding of adjunctive measures for cholesterol lowering.  Adjunctive measures started by the patient include limit alcohol consumpton and weight reduction.  He notes side effects from his lipid-lowering medication.  The patient notes symptoms to suggest myopathy or liver disease.  Comments include: myalgias.      Preventive Screening-Counseling & Management  Alcohol-Tobacco     Alcohol drinks/day: <1     Alcohol type: beer     >5/day in last 3 mos: no     Smoking Status: never  Current Medications (verified): 1)  Diovan Hct 320-25 Mg Tabs (Valsartan-Hydrochlorothiazide) .... Take 1 Tablet By Mouth Once A Day 2)  Tekturna 300 Mg Tabs (Aliskiren Fumarate) .... Once Daily For High Blood Pressure 3)  Glimepiride 4 Mg Tabs (Glimepiride) .... Take 1 Tablet By Mouth Once A Day 4)  Aspirin 81 Mg  Tabs (Aspirin) .... Take One Tablet By Mouth Every Other Day 5)  Metformin Hcl 500 Mg Tabs (Metformin Hcl) .... One By Mouth Three Times A Day For Diabetes 6)  Prilosec Otc 20 Mg Tbec (Omeprazole Magnesium) .... Once Daily For Heartburn 7)  Bupropion Hcl 150 Mg Xr24h-Tab (Bupropion Hcl) .... Once Daily For Depression 8)  Crestor 20 Mg Tabs (Rosuvastatin  Calcium) .... Once Daily For Cholesterol 9)  Cialis 20 Mg Tabs (Tadalafil) .... Take One By Mouth Q 3 Days As Directed  Allergies (verified): No Known Drug Allergies  Past History:  Past Medical History: Reviewed history from 08/23/2008 and no changes required. Anemia-NOS Diabetes mellitus, type II GERD Hyperlipidemia Hypertension Osteoarthritis Depression  Past Surgical History: Reviewed history from 08/23/2008 and no changes required. Denies surgical history  Family History: Reviewed history from 08/23/2008 and no changes required. Family History of  Alcoholism/Addiction Family History of CAD Male 1st degree relative <50 Family History Diabetes 1st degree relative Family History of Stroke F 1st degree relative <60 Family History of Sudden Death  Social History: Reviewed history from 08/23/2008 and no changes required. Retired Married Never Smoked Alcohol use-yes Drug use-no Regular exercise-no  Review of Systems       The patient complains of weight gain and depression.  The patient denies chest pain, abdominal pain, suspicious skin lesions, and enlarged lymph nodes.   Psych:  Complains of depression; denies anxiety, easily angered, easily tearful, irritability, panic attacks, sense of great danger, suicidal thoughts/plans, thoughts of violence, unusual visions or sounds, and thoughts /plans of harming others. Endo:  Denies cold intolerance, excessive hunger, excessive thirst, excessive urination, heat intolerance, polyuria, and weight change.  Physical Exam  General:  alert, well-developed, well-nourished, well-hydrated, cooperative to examination, good hygiene, and overweight-appearing.   Head:  normocephalic, atraumatic, no abnormalities observed, and no abnormalities palpated.   Neck:  supple, full ROM, no masses, normal carotid upstroke, no carotid bruits, no cervical lymphadenopathy, and no neck tenderness.   Lungs:  Normal respiratory effort, chest expands symmetrically. Lungs are clear to auscultation, no crackles or wheezes. Heart:  Normal rate and regular rhythm. S1 and S2 normal without gallop, murmur, click, rub or other extra sounds. Abdomen:  soft, non-tender, normal bowel sounds, no distention, no masses, no guarding, no rigidity, no rebound tenderness, no abdominal hernia, no inguinal hernia, no hepatomegaly, and no splenomegaly.   Msk:  No deformity or scoliosis noted of thoracic or lumbar spine.   Pulses:  R and L carotid,radial,femoral,dorsalis pedis and posterior tibial pulses are full and equal  bilaterally Extremities:  No clubbing, cyanosis, edema, or deformity noted with normal full range of motion of all joints.   Neurologic:  No cranial nerve deficits noted. Station and gait are normal. Plantar reflexes are down-going bilaterally. DTRs are symmetrical throughout. Sensory, motor and coordinative functions appear intact. Skin:  turgor normal, color normal, no rashes, and no suspicious lesions.   Psych:  Oriented X3, memory intact for recent and remote, normally interactive, good eye contact, not anxious appearing, and not depressed appearing.    Diabetes Management Exam:    Foot Exam (with socks and/or shoes not present):       Sensory-Pinprick/Light touch:          Left medial foot (L-4): normal          Left dorsal foot (L-5): normal          Left lateral foot (S-1): normal          Right medial foot (L-4): normal          Right dorsal foot (L-5): normal          Right lateral foot (S-1): normal       Sensory-Monofilament:          Left foot: normal          Right foot: normal  Inspection:          Left foot: normal          Right foot: normal       Nails:          Left foot: normal          Right foot: normal   Impression & Recommendations:  Problem # 1:  HYPERTENSION (ICD-401.9) Assessment Improved  His updated medication list for this problem includes:    Diovan Hct 320-25 Mg Tabs (Valsartan-hydrochlorothiazide) .Marland Kitchen... Take 1 tablet by mouth once a day    Tekturna 300 Mg Tabs (Aliskiren fumarate) ..... Once daily for high blood pressure  Orders: Venipuncture (16109) TLB-Lipid Panel (80061-LIPID) TLB-BMP (Basic Metabolic Panel-BMET) (80048-METABOL) TLB-CBC Platelet - w/Differential (85025-CBCD) TLB-Hepatic/Liver Function Pnl (80076-HEPATIC) TLB-TSH (Thyroid Stimulating Hormone) (84443-TSH) TLB-A1C / Hgb A1C (Glycohemoglobin) (83036-A1C) TLB-Udip w/ Micro (81001-URINE)  BP today: 130/80 Prior BP: 134/84 (03/17/2009)  Prior 10 Yr Risk Heart Disease:  Not enough information (08/23/2008)  Labs Reviewed: K+: 3.9 (08/23/2008) Creat: : 0.9 (08/23/2008)   Chol: 223 (08/23/2008)   HDL: 38.00 (08/23/2008)   TG: 122.0 (08/23/2008)  Problem # 2:  DEPRESSION (ICD-311) Assessment: Deteriorated  His updated medication list for this problem includes:    Bupropion Hcl 150 Mg Xr24h-tab (Bupropion hcl) ..... Once daily for depression  Orders: Psychiatric Referral (Psych)  Discussed treatment options, including trial of antidpressant medication. Will refer to behavioral health. Follow-up call in in 24-48 hours and recheck in 2 weeks, sooner as needed. Patient agrees to call if any worsening of symptoms or thoughts of doing harm arise. Verified that the patient has no suicidal ideation at this time.   Problem # 3:  HYPERLIPIDEMIA (ICD-272.4) Assessment: Deteriorated he did not tolerate crestor due to myalgias The following medications were removed from the medication list:    Crestor 20 Mg Tabs (Rosuvastatin calcium) ..... Once daily for cholesterol  Orders: Venipuncture (60454) TLB-Lipid Panel (80061-LIPID) TLB-BMP (Basic Metabolic Panel-BMET) (80048-METABOL) TLB-CBC Platelet - w/Differential (85025-CBCD) TLB-Hepatic/Liver Function Pnl (80076-HEPATIC) TLB-TSH (Thyroid Stimulating Hormone) (84443-TSH) TLB-A1C / Hgb A1C (Glycohemoglobin) (83036-A1C) TLB-Udip w/ Micro (81001-URINE)  Problem # 4:  DIABETES MELLITUS, TYPE II (ICD-250.00) Assessment: Unchanged  His updated medication list for this problem includes:    Diovan Hct 320-25 Mg Tabs (Valsartan-hydrochlorothiazide) .Marland Kitchen... Take 1 tablet by mouth once a day    Glimepiride 4 Mg Tabs (Glimepiride) .Marland Kitchen... Take 1 tablet by mouth once a day    Aspirin 81 Mg Tabs (Aspirin) .Marland Kitchen... Take one tablet by mouth every other day    Metformin Hcl 500 Mg Tabs (Metformin hcl) ..... One by mouth three times a day for diabetes  Orders: Venipuncture (09811) TLB-Lipid Panel (80061-LIPID) TLB-BMP (Basic  Metabolic Panel-BMET) (80048-METABOL) TLB-CBC Platelet - w/Differential (85025-CBCD) TLB-Hepatic/Liver Function Pnl (80076-HEPATIC) TLB-TSH (Thyroid Stimulating Hormone) (84443-TSH) TLB-A1C / Hgb A1C (Glycohemoglobin) (83036-A1C) TLB-Udip w/ Micro (81001-URINE)  Labs Reviewed: Creat: 0.9 (08/23/2008)    Reviewed HgBA1c results: 7.1 (08/23/2008)  Complete Medication List: 1)  Diovan Hct 320-25 Mg Tabs (Valsartan-hydrochlorothiazide) .... Take 1 tablet by mouth once a day 2)  Tekturna 300 Mg Tabs (Aliskiren fumarate) .... Once daily for high blood pressure 3)  Glimepiride 4 Mg Tabs (Glimepiride) .... Take 1 tablet by mouth once a day 4)  Aspirin 81 Mg Tabs (Aspirin) .... Take one tablet by mouth every other day 5)  Metformin Hcl 500 Mg Tabs (Metformin hcl) .... One by mouth three times a day for diabetes 6)  Prilosec Otc 20 Mg  Tbec (Omeprazole magnesium) .... Once daily for heartburn 7)  Bupropion Hcl 150 Mg Xr24h-tab (Bupropion hcl) .... Once daily for depression 8)  Cialis 20 Mg Tabs (Tadalafil) .... Take one by mouth q 3 days as directed  Hypertension Assessment/Plan:      The patient's hypertensive risk group is category C: Target organ damage and/or diabetes.  Today's blood pressure is 130/80.  His blood pressure goal is < 130/80.  Lipid Assessment/Plan:      Based on NCEP/ATP III, the patient's risk factor category is "history of diabetes".  The patient's lipid goals are as follows: Total cholesterol goal is 200; LDL cholesterol goal is 100; HDL cholesterol goal is 40; Triglyceride goal is 150.    Patient Instructions: 1)  Please schedule a follow-up appointment in 3 months. 2)  It is important that you exercise regularly at least 20 minutes 5 times a week. If you develop chest pain, have severe difficulty breathing, or feel very tired , stop exercising immediately and seek medical attention. 3)  You need to lose weight. Consider a lower calorie diet and regular exercise.  4)   Check your blood sugars regularly. If your readings are usually above 200  or below 70 you should contact our office. 5)  It is important that your Diabetic A1c level is checked every 3 months. 6)  See your eye doctor yearly to check for diabetic eye damage. 7)  Check your feet each night for sore areas, calluses or signs of infection. 8)  Check your Blood Pressure regularly. If it is above 130/80: you should make an appointment.   Not Administered:    Influenza Vaccine not given due to: declined   Appended Document: 2 MOS F/U // #/CD  - rs'd from bmp list/cd

## 2010-05-05 NOTE — Miscellaneous (Signed)
Summary: [facility] Previsit/prep  Clinical Lists Changes  Medications: Added new medication of MOVIPREP 100 GM  SOLR (PEG-KCL-NACL-NASULF-NA ASC-C) As per prep instructions. - Signed Rx of MOVIPREP 100 GM  SOLR (PEG-KCL-NACL-NASULF-NA ASC-C) As per prep instructions.;  #1 x 0;  Signed;  Entered by: Wyona Almas RN;  Authorized by: Rachael Fee MD;  Method used: Electronically to [facility] [address] [phone]*, [address], [place], [place], [place]  [id number], Ph: [phone] or [phone], Fax: [phone] Observations: Added new observation of ALLERGY REV: Done (12/01/2009 9:03)    Prescriptions: MOVIPREP 100 GM  SOLR (PEG-KCL-NACL-NASULF-NA ASC-C) As per prep instructions.  #1 x 0   Entered by:   Wyona Almas RN   Authorized by:   Rachael Fee MD   Signed by:   Wyona Almas RN on 12/01/2009   Method used:   [facility]ronically to        [facility]  [phone]* (retail)       [address]       [place], [place]  [id number]       Ph: [phone] or [phone]       Fax: [phone]   RxID:   [id number]

## 2010-05-05 NOTE — Assessment & Plan Note (Signed)
Summary: FU [name]  #   Vital Signs:  Patient profile:   60 year old male Height:      70.5 inches Weight:      250 pounds BMI:     35.49 O2 Sat:      98 % on Room air Temp:     97.6 degrees F oral Pulse rate:   71 / minute Pulse rhythm:   regular Resp:     16 per minute BP sitting:   146 / 82  (left arm) Cuff size:   large  Vitals Entered By: Rock Nephew CMA (October 23, 2009 10:14 AM)  Nutrition Counseling: Patient's BMI is greater than 25 and therefore counseled on weight management options.  O2 Flow:  Room air CC: follow-up visit// med refill, Hypertension Management, Preventive Care Is Patient Diabetic? No Pain Assessment Patient in pain? no        Primary Care Provider:  Etta Grandchild MD  CC:  follow-up visit// med refill, Hypertension Management, and Preventive Care.  History of Present Illness: He returns and tells me that he had an episode of DOE last week while he was mowing the grass. He has felt well since then with no CP or SOB. He tells me that he had a normal cardiac cath. 5 years ago with Dr. Allyson Sabal.  Hypertension History:      He complains of dyspnea with exertion and peripheral edema, but denies headache, chest pain, palpitations, orthopnea, PND, visual symptoms, neurologic problems, syncope, and side effects from treatment.  He notes no problems with any antihypertensive medication side effects.        Positive major cardiovascular risk factors include male age 67 years old or older, diabetes, hyperlipidemia, hypertension, and family history for ischemic heart disease (males less than 70 years old).  Negative major cardiovascular risk factors include non-tobacco-user status.        Further assessment for target organ damage reveals no history of ASHD, cardiac end-organ damage (CHF/LVH), stroke/TIA, peripheral vascular disease, renal insufficiency, or hypertensive retinopathy.     Current Medications (verified): 1)  Diovan Hct 320-25 Mg Tabs  (Valsartan-Hydrochlorothiazide) .... Take 1 Tablet By Mouth Once A Day 2)  Tekturna 300 Mg Tabs (Aliskiren Fumarate) .... Once Daily For High Blood Pressure 3)  Glimepiride 4 Mg Tabs (Glimepiride) .... Take 1 Tablet By Mouth Once A Day 4)  Aspirin 81 Mg  Tabs (Aspirin) .... Take One Tablet By Mouth Every Other Day 5)  Metformin Hcl 500 Mg Tabs (Metformin Hcl) .... One By Mouth Three Times A Day For Diabetes 6)  Prilosec Otc 20 Mg Tbec (Omeprazole Magnesium) .... Once Daily For Heartburn 7)  Bupropion Hcl 150 Mg Xr24h-Tab (Bupropion Hcl) .... Once Daily For Depression  Allergies (verified): No Known Drug Allergies  Past History:  Past Medical History: Last updated: 08/23/2008 Anemia-NOS Diabetes mellitus, type II GERD Hyperlipidemia Hypertension Osteoarthritis Depression  Past Surgical History: Last updated: 08/23/2008 Denies surgical history  Family History: Last updated: 08/23/2008 Family History of Alcoholism/Addiction Family History of CAD Male 1st degree relative <50 Family History Diabetes 1st degree relative Family History of Stroke F 1st degree relative <60 Family History of Sudden Death  Social History: Last updated: 08/23/2008 Retired Married Never Smoked Alcohol use-yes Drug use-no Regular exercise-no  Risk Factors: Alcohol Use: <1 (05/20/2009) >5 drinks/d w/in last 3 months: no (05/20/2009) Exercise: no (08/23/2008)  Risk Factors: Smoking Status: never (05/20/2009)  Family History: Reviewed history from 08/23/2008 and no changes required. Family History  of Alcoholism/Addiction Family History of CAD Male 1st degree relative <50 Family History Diabetes 1st degree relative Family History of Stroke F 1st degree relative <60 Family History of Sudden Death  Social History: Reviewed history from 08/23/2008 and no changes required. Retired Married Never Smoked Alcohol use-yes Drug use-no Regular exercise-no  Review of Systems       The patient  complains of weight gain.  The patient denies anorexia, fever, weight loss, chest pain, syncope, prolonged cough, headaches, hemoptysis, abdominal pain, melena, hematochezia, severe indigestion/heartburn, hematuria, suspicious skin lesions, enlarged lymph nodes, angioedema, and testicular masses.   GU:  Complains of erectile dysfunction; denies decreased libido, discharge, dysuria, genital sores, hematuria, incontinence, nocturia, urinary frequency, and urinary hesitancy. Endo:  Denies cold intolerance, excessive hunger, excessive thirst, excessive urination, heat intolerance, and polyuria. Heme:  Denies abnormal bruising, bleeding, enlarge lymph nodes, fevers, pallor, and skin discoloration.  Physical Exam  General:  alert, well-developed, well-nourished, well-hydrated, cooperative to examination, good hygiene, and overweight-appearing.   Head:  normocephalic, atraumatic, no abnormalities observed, and no abnormalities palpated.   Eyes:  vision grossly intact, pupils equal, and pupils round.   Mouth:  Oral mucosa and oropharynx without lesions or exudates.  Teeth in good repair. Neck:  supple, full ROM, no masses, no thyromegaly, no JVD, normal carotid upstroke, no carotid bruits, no cervical lymphadenopathy, and no neck tenderness.   Breasts:  No masses or gynecomastia noted Lungs:  Normal respiratory effort, chest expands symmetrically. Lungs are clear to auscultation, no crackles or wheezes. Heart:  Normal rate and regular rhythm. S1 and S2 normal without gallop, murmur, click, rub or other extra sounds. Abdomen:  Bowel sounds positive,abdomen soft and non-tender without masses, organomegaly or hernias noted. Rectal:  No external abnormalities noted. Normal sphincter tone. No rectal masses or tenderness. Genitalia:  circumcised, no hydrocele, no varicocele, no scrotal masses, no testicular masses or atrophy, no cutaneous lesions, and no urethral discharge.   Prostate:  no gland enlargement, no  nodules, no asymmetry, and no induration.   Msk:  normal ROM, no joint tenderness, no joint swelling, no joint warmth, no redness over joints, no joint deformities, no joint instability, and no crepitation.   Pulses:  R and L carotid,radial,femoral,dorsalis pedis and posterior tibial pulses are full and equal bilaterally Extremities:  No clubbing, cyanosis, edema, or deformity noted with normal full range of motion of all joints.   Neurologic:  No cranial nerve deficits noted. Station and gait are normal. Plantar reflexes are down-going bilaterally. DTRs are symmetrical throughout. Sensory, motor and coordinative functions appear intact. Skin:  turgor normal, color normal, no rashes, no suspicious lesions, no ecchymoses, no petechiae, no purpura, no ulcerations, and no edema.   Cervical Nodes:  No lymphadenopathy noted Axillary Nodes:  No palpable lymphadenopathy Inguinal Nodes:  No significant adenopathy Psych:  Cognition and judgment appear intact. Alert and cooperative with normal attention span and concentration. No apparent delusions, illusions, hallucinations Additional Exam:  EKG shows sinus brady with loss of voltage on V1-V3 with ? Q waves. There are no acute ST/T wave changes.  Diabetes Management Exam:    Foot Exam (with socks and/or shoes not present):       Sensory-Pinprick/Light touch:          Left medial foot (L-4): normal          Left dorsal foot (L-5): normal          Left lateral foot (S-1): normal  Right medial foot (L-4): normal          Right dorsal foot (L-5): normal          Right lateral foot (S-1): normal       Sensory-Monofilament:          Left foot: normal          Right foot: normal       Inspection:          Left foot: normal          Right foot: normal       Nails:          Left foot: normal          Right foot: thickened    Eye Exam:       Eye Exam done elsewhere          Date: 10/22/2009          Results: normal          Done by: ? MD in  HP   Impression & Recommendations:  Problem # 1:  ABNORMAL ELECTROCARDIOGRAM (ICD-794.31) Assessment New  Orders: EKG w/ Interpretation (93000) Cardiology Referral (Cardiology)  Problem # 2:  ROUTINE GENERAL MEDICAL EXAM@HEALTH  CARE FACL (ICD-V70.0)  Orders: Venipuncture (04540) TLB-Lipid Panel (80061-LIPID) TLB-BMP (Basic Metabolic Panel-BMET) (80048-METABOL) TLB-CBC Platelet - w/Differential (85025-CBCD) TLB-Hepatic/Liver Function Pnl (80076-HEPATIC) TLB-TSH (Thyroid Stimulating Hormone) (84443-TSH) TLB-A1C / Hgb A1C (Glycohemoglobin) (83036-A1C) TLB-PSA (Prostate Specific Antigen) (84153-PSA) TLB-Udip w/ Micro (81001-URINE) Hemoccult Guaiac-1 spec.(in office) (98119) Gastroenterology Referral (GI)  Problem # 3:  HYPERTENSION (ICD-401.9) Assessment: Unchanged  His updated medication list for this problem includes:    Diovan Hct 320-25 Mg Tabs (Valsartan-hydrochlorothiazide) .Marland Kitchen... Take 1 tablet by mouth once a day    Tekturna 300 Mg Tabs (Aliskiren fumarate) ..... Once daily for high blood pressure  Orders: Venipuncture (14782) TLB-Lipid Panel (80061-LIPID) TLB-BMP (Basic Metabolic Panel-BMET) (80048-METABOL) TLB-CBC Platelet - w/Differential (85025-CBCD) TLB-Hepatic/Liver Function Pnl (80076-HEPATIC) TLB-TSH (Thyroid Stimulating Hormone) (84443-TSH) TLB-A1C / Hgb A1C (Glycohemoglobin) (83036-A1C) TLB-PSA (Prostate Specific Antigen) (84153-PSA) TLB-Udip w/ Micro (81001-URINE)  BP today: 146/82 Prior BP: 130/80 (05/20/2009)  Prior 10 Yr Risk Heart Disease: Not enough information (08/23/2008)  Labs Reviewed: K+: 4.5 (05/20/2009) Creat: : 1.0 (05/20/2009)   Chol: 215 (05/20/2009)   HDL: 43.00 (05/20/2009)   TG: 91.0 (05/20/2009)  Problem # 4:  DIABETES MELLITUS, TYPE II (ICD-250.00) Assessment: Unchanged  His updated medication list for this problem includes:    Diovan Hct 320-25 Mg Tabs (Valsartan-hydrochlorothiazide) .Marland Kitchen... Take 1 tablet by mouth once a day     Glimepiride 4 Mg Tabs (Glimepiride) .Marland Kitchen... Take 1 tablet by mouth once a day    Aspirin 81 Mg Tabs (Aspirin) .Marland Kitchen... Take one tablet by mouth every other day    Metformin Hcl 500 Mg Tabs (Metformin hcl) ..... One by mouth three times a day for diabetes  Orders: Venipuncture (95621) TLB-Lipid Panel (80061-LIPID) TLB-BMP (Basic Metabolic Panel-BMET) (80048-METABOL) TLB-CBC Platelet - w/Differential (85025-CBCD) TLB-Hepatic/Liver Function Pnl (80076-HEPATIC) TLB-TSH (Thyroid Stimulating Hormone) (84443-TSH) TLB-A1C / Hgb A1C (Glycohemoglobin) (83036-A1C) TLB-PSA (Prostate Specific Antigen) (84153-PSA) TLB-Udip w/ Micro (81001-URINE)  Problem # 5:  HYPERLIPIDEMIA (ICD-272.4) Assessment: Unchanged  Orders: Venipuncture (30865) TLB-Lipid Panel (80061-LIPID) TLB-BMP (Basic Metabolic Panel-BMET) (80048-METABOL) TLB-CBC Platelet - w/Differential (85025-CBCD) TLB-Hepatic/Liver Function Pnl (80076-HEPATIC) TLB-TSH (Thyroid Stimulating Hormone) (84443-TSH) TLB-A1C / Hgb A1C (Glycohemoglobin) (83036-A1C) TLB-PSA (Prostate Specific Antigen) (84153-PSA) TLB-Udip w/ Micro (81001-URINE)  Labs Reviewed: SGOT: 21 (05/20/2009)   SGPT: 30 (05/20/2009)  Lipid Goals:  Chol Goal: 200 (08/23/2008)   HDL Goal: 40 (08/23/2008)   LDL Goal: 100 (08/23/2008)   TG Goal: 150 (08/23/2008)  Prior 10 Yr Risk Heart Disease: Not enough information (08/23/2008)   HDL:43.00 (05/20/2009), 38.00 (08/23/2008)  Chol:215 (05/20/2009), 223 (08/23/2008)  Trig:91.0 (05/20/2009), 122.0 (08/23/2008)  Complete Medication List: 1)  Diovan Hct 320-25 Mg Tabs (Valsartan-hydrochlorothiazide) .... Take 1 tablet by mouth once a day 2)  Tekturna 300 Mg Tabs (Aliskiren fumarate) .... Once daily for high blood pressure 3)  Glimepiride 4 Mg Tabs (Glimepiride) .... Take 1 tablet by mouth once a day 4)  Aspirin 81 Mg Tabs (Aspirin) .... Take one tablet by mouth every other day 5)  Metformin Hcl 500 Mg Tabs (Metformin hcl) .... One by  mouth three times a day for diabetes 6)  Prilosec Otc 20 Mg Tbec (Omeprazole magnesium) .... Once daily for heartburn 7)  Bupropion Hcl 150 Mg Xr24h-tab (Bupropion hcl) .... Once daily for depression  Hypertension Assessment/Plan:      The patient's hypertensive risk group is category C: Target organ damage and/or diabetes.  Today's blood pressure is 146/82.  His blood pressure goal is < 130/80.  Colorectal Screening:  Current Recommendations:    Hemoccult: NEG X 1 today    Colonoscopy recommended: scheduled with G.I.  PSA Screening:    PSA: 1.52  (08/23/2008)    Reviewed PSA screening recommendations: PSA ordered  Immunization & Chemoprophylaxis:    Tetanus vaccine: Tdap  (08/23/2008)    Pneumovax: Pneumovax  (08/23/2008)  Patient Instructions: 1)  Please schedule a follow-up appointment in 2 months. 2)  Check your blood sugars regularly. If your readings are usually above 200 or below 70 you should contact our office. 3)  It is important that your Diabetic A1c level is checked every 3 months. 4)  See your eye doctor yearly to check for diabetic eye damage. 5)  Check your feet each night for sore areas, calluses or signs of infection. 6)  Check your Blood Pressure regularly. If it is above 130/80: you should make an appointment.

## 2010-05-05 NOTE — Progress Notes (Signed)
     New Problems: HEAD TRAUMA, CLOSED (ICD-959.01)   New Problems: HEAD TRAUMA, CLOSED (ICD-959.01)

## 2010-05-05 NOTE — Progress Notes (Signed)
Summary: Records request from [facility]  Request for records received from [facility]. Request forwarded to [employer]. Dena Chavis  May 09, 2009 12:44 PM

## 2010-05-05 NOTE — Letter (Signed)
Summary: Lipid Letter  [facility]  [address] [place], [place] [address]   Phone: [phone]  Fax: [phone]    10/23/2009  [name] [address] [place], [place]  [address]  Dear [name]:  We have carefully reviewed your last lipid profile from  and the results are noted below with a summary of recommendations for lipid management.    Cholesterol:       204     Goal: <200   HDL "good" Cholesterol:   57.84     Goal: >40   LDL "bad" Cholesterol:   159     Goal: <100   Triglycerides:       80.0     Goal: <150        TLC Diet (Therapeutic Lifestyle Change): Saturated Fats & Transfatty acids should be kept < 7% of total calories ***Reduce Saturated Fats Polyunstaurated Fat can be up to 10% of total calories Monounsaturated Fat Fat can be up to 20% of total calories Total Fat should be no greater than 25-35% of total calories Carbohydrates should be 50-60% of total calories Protein should be approximately 15% of total calories Fiber should be at least 20-30 grams a day ***Increased fiber may help lower LDL Total Cholesterol should be < 200mg /day Consider adding plant stanol/sterols to diet (example: Benacol spread) ***A higher intake of unsaturated fat may reduce Triglycerides and Increase HDL    Adjunctive Measures (may lower LIPIDS and reduce risk of Heart Attack) include: Aerobic Exercise (20-30 minutes 3-4 times a week) Limit Alcohol Consumption Weight Reduction Aspirin 75-81 mg a day by mouth (if not allergic or contraindicated) Dietary Fiber 20-30 grams a day by mouth     Current Medications: 1)    Diovan Hct 320-25 Mg Tabs (Valsartan-hydrochlorothiazide) .... Take 1 tablet by mouth once a day 2)    Tekturna 300 Mg Tabs (Aliskiren fumarate) .... Once daily for high blood pressure 3)    Glimepiride 4 Mg Tabs (Glimepiride) .... Take 1 tablet by mouth once a day 4)    Aspirin 81 Mg  Tabs (Aspirin) .... Take one tablet by mouth every other  day 5)    Metformin Hcl 500 Mg Tabs (Metformin hcl) .... One by mouth three times a day for diabetes 6)    Prilosec Otc 20 Mg Tbec (Omeprazole magnesium) .... Once daily for heartburn 7)    Bupropion Hcl 150 Mg Xr24h-tab (Bupropion hcl) .... Once daily for depression  If you have any questions, please call. We appreciate being able to work with you.   Sincerely,    [facility] Ronald Grandchild MD

## 2010-05-05 NOTE — Letter (Signed)
Summary: Previsit letter  [facility]  [address] [place], [place] [address]   Phone: [phone]  Fax: [phone]       10/28/2009 MRN: [id number]  [name] [address] [place], [place]  [address]  Dear Mr. CASEBOLT,  Welcome to the Gastroenterology Division at [facility].    You are scheduled to see a nurse for your pre-procedure visit on 12-01-09 at 9:00a.m. on the 3rd floor at [facility] [address]  We ask that you try to arrive at our office 15 minutes prior to your appointment time to allow for check-in.  Your nurse visit will consist of discussing your medical and surgical history, your immediate family medical history, and your medications.    Please bring a complete list of all your medications or, if you prefer, bring the medication bottles and we will list them.  We will need to be aware of both prescribed and over the counter drugs.  We will need to know exact dosage information as well.  If you are on blood thinners (Coumadin, Plavix, Aggrenox, Ticlid, etc.) please call our office today/prior to your appointment, as we need to consult with your physician about holding your medication.   Please be prepared to read and sign documents such as consent forms, a financial agreement, and acknowledgement forms.  If necessary, and with your consent, a friend or relative is welcome to sit-in on the nurse visit with you.  Please bring your insurance card so that we may make a copy of it.  If your insurance requires a referral to see a specialist, please bring your referral form from your primary care physician.  No co-pay is required for this nurse visit.     If you cannot keep your appointment, please call [phone] to cancel or reschedule prior to your appointment date.  This allows [place] the opportunity to schedule an appointment for another patient in need of care.    Thank you for choosing [facility] for your medical needs.   We appreciate the opportunity to care for you.  Please visit [place] at our website  to learn more about our practice.                     Sincerely.                                                                                                                   The Gastroenterology Division

## 2010-05-05 NOTE — Progress Notes (Signed)
Summary: Samples  Phone Note Call from Patient Call back at Home Phone [phone]   Summary of Call: Patient spouse left message on triage that their insurance is changing and patient copay for for Tekturna/Diovan is $80. They cannot afford that and would like to pick up coupons/samples. Initial call taken by: Lucious Groves,  July 31, 2009 10:59 AM  Follow-up for Phone Call        Spouse notified sample of diovan and tekturna placed up front with tekturna coupon. Follow-up by: Lucious Groves,  July 31, 2009 11:05 AM

## 2010-05-05 NOTE — Letter (Signed)
Summary: Results Follow-up Letter  [facility]  [address] [place], [place] [address]   Phone: [phone]  Fax: [phone]    01/16/2010  [address] [place], [place]  [address]  Dear Mr. SEESE,   The following are the results of your recent test(s):  Test     Result     CT scan of brain   normal Blood sugars   slightly elevated Liver/kidney   normal   _________________________________________________________  Please call for an appointment as directed _________________________________________________________ _________________________________________________________ _________________________________________________________  Sincerely,  Sanda Linger MD [facility]

## 2010-05-05 NOTE — Progress Notes (Signed)
Summary: LETTER  Phone Note Call from Patient Call back at Home Phone [phone]   Caller: Patient Summary of Call: Patient is attempting to get custody of a nephew. He is req a letter from MD with pt's overall health status.  Initial call taken by: Lamar Sprinkles, CMA,  January 09, 2010 12:47 PM  Follow-up for Phone Call        Letter ready, Pt informed  Follow-up by: Lamar Sprinkles, CMA,  January 12, 2010 9:11 AM

## 2010-05-05 NOTE — Letter (Signed)
Summary: Lipid Letter  [facility]  [address] [place], [place] [address]   Phone: [phone]  Fax: [phone]    05/20/2009  [name] [address] [place], [place]  [address]  Dear [name]:  We have carefully reviewed your last lipid profile from  and the results are noted below with a summary of recommendations for lipid management.    Cholesterol:       215     Goal: <200   HDL "good" Cholesterol:   66.44     Goal: >40   LDL "bad" Cholesterol:   162     Goal: <100   Triglycerides:       91.0     Goal: <150    so-so results    TLC Diet (Therapeutic Lifestyle Change): Saturated Fats & Transfatty acids should be kept < 7% of total calories ***Reduce Saturated Fats Polyunstaurated Fat can be up to 10% of total calories Monounsaturated Fat Fat can be up to 20% of total calories Total Fat should be no greater than 25-35% of total calories Carbohydrates should be 50-60% of total calories Protein should be approximately 15% of total calories Fiber should be at least 20-30 grams a day ***Increased fiber may help lower LDL Total Cholesterol should be < 200mg /day Consider adding plant stanol/sterols to diet (example: Benacol spread) ***A higher intake of unsaturated fat may reduce Triglycerides and Increase HDL    Adjunctive Measures (may lower LIPIDS and reduce risk of Heart Attack) include: Aerobic Exercise (20-30 minutes 3-4 times a week) Limit Alcohol Consumption Weight Reduction Aspirin 75-81 mg a day by mouth (if not allergic or contraindicated) Dietary Fiber 20-30 grams a day by mouth     Current Medications: 1)    Diovan Hct 320-25 Mg Tabs (Valsartan-hydrochlorothiazide) .... Take 1 tablet by mouth once a day 2)    Tekturna 300 Mg Tabs (Aliskiren fumarate) .... Once daily for high blood pressure 3)    Glimepiride 4 Mg Tabs (Glimepiride) .... Take 1 tablet by mouth once a day 4)    Aspirin 81 Mg  Tabs (Aspirin) .... Take one tablet by mouth  every other day 5)    Metformin Hcl 500 Mg Tabs (Metformin hcl) .... One by mouth three times a day for diabetes 6)    Prilosec Otc 20 Mg Tbec (Omeprazole magnesium) .... Once daily for heartburn 7)    Bupropion Hcl 150 Mg Xr24h-tab (Bupropion hcl) .... Once daily for depression 8)    Cialis 20 Mg Tabs (Tadalafil) .... Take one by mouth q 3 days as directed  If you have any questions, please call. We appreciate being able to work with you.   Sincerely,    [facility] Etta Grandchild MD

## 2010-05-05 NOTE — Procedures (Signed)
Summary: Colonoscopy  Patient: [name] Note: All result statuses are Final unless otherwise noted.  Tests: (1) Colonoscopy (COL)   COL Colonoscopy           DONE     [facility]     [address]     [place], [place]  [address]           COLONOSCOPY PROCEDURE REPORT           PATIENT:  [name], [name]  MR#:  [id number]     BIRTHDATE:  November 02, 1950, 58 yrs. old  GENDER:  male     ENDOSCOPIST:  Rachael Fee, MD     REF. BY:  Etta Grandchild, M.D.     PROCEDURE DATE:  12/15/2009     PROCEDURE:  Colonoscopy with snare polypectomy     ASA CLASS:  Class II     INDICATIONS:  Routine Risk Screening     MEDICATIONS:   Fentanyl 75 mcg IV, Versed 7 mg IV           DESCRIPTION OF PROCEDURE:   After the risks benefits and     alternatives of the procedure were thoroughly explained, informed     consent was obtained.  Digital rectal exam was performed and     revealed no rectal masses.   The LB CF-[id number] endoscope     was introduced through the anus and advanced to the cecum, which     was identified by both the appendix and ileocecal valve, without     limitations.  The quality of the prep was good, using MoviPrep.     The instrument was then slowly withdrawn as the colon was fully     examined.     <<PROCEDUREIMAGES>>     FINDINGS:  A sessile polyp was found in the sigmoid colon. This     was 4mm across, removed with cold snare and sent to pathology (jar     1) (see image2).  Mild diverticulosis was found in the sigmoid to     descending colon segments (see image1).  This was otherwise a     normal examination of the colon (see image3, image4, and image5).     Retroflexed views in the rectum revealed no abnormalities.    The     scope was then withdrawn from the patient and the procedure     completed.     COMPLICATIONS:  None     ENDOSCOPIC IMPRESSION:     1) Sessile polyp in the sigmoid colon, removed and sent to     pathology     2) Mild diverticulosis in the  sigmoid to descending colon     segments     3) Otherwise normal examination           RECOMMENDATIONS:     1) If the polyp(s) removed today are proven to be adenomatous     (pre-cancerous) polyps, you will need a repeat colonoscopy in 5     years. Otherwise you should continue to follow colorectal cancer     screening guidelines for "routine risk" patients with colonoscopy     in 10 years.     2) You will receive a letter within 1-2 weeks with the results     of your biopsy as well as final recommendations. Please call my     office if you have not received a letter after 3 weeks.  ______________________________     Rachael Fee, MD           n.     eSIGNED:   Rachael Fee at 12/15/2009 12:25 PM           [name], [id number]  Note: An exclamation mark (!) indicates a result that was not dispersed into the flowsheet. Document Creation Date: 12/15/2009 12:26 PM _______________________________________________________________________  (1) Order result status: Final Collection or observation date-time: 12/15/2009 12:22 Requested date-time:  Receipt date-time:  Reported date-time:  Referring Physician:   Ordering Physician: Rob Bunting [phone]) Specimen Source:  Source: Launa Grill Order Number: [id number] Lab site:   Appended Document: Colonoscopy     Procedures Next Due Date:    Colonoscopy: 12/2014

## 2010-05-05 NOTE — Letter (Signed)
Summary: [facility]  [facility]   Imported By: Sherian Rein 02/10/2010 09:35:54  _____________________________________________________________________  External Attachment:    Type:   Image     Comment:   External Document

## 2010-05-05 NOTE — Letter (Signed)
Summary: Results Follow-up Letter  [facility]  [address] [place], [place] [address]   Phone: [phone]  Fax: [phone]    05/20/2009  [address] [place], [place]  [address]  Dear Mr. HUISH,   The following are the results of your recent test(s):  Test     Result     A1C=6.8     good blood sugar control Liver/kidney   normal Thyroid     normal Testosterone   normal Urine       normal  _________________________________________________________  Please call for an appointment as directed _________________________________________________________ _________________________________________________________ _________________________________________________________  Sincerely,  Sanda Linger MD [facility]

## 2010-05-05 NOTE — Progress Notes (Signed)
       New/Updated Medications: CRESTOR 20 MG TABS (ROSUVASTATIN CALCIUM) One by mouth once daily for cholesterol Prescriptions: CRESTOR 20 MG TABS (ROSUVASTATIN CALCIUM) One by mouth once daily for cholesterol  #30 x 11   Entered and Authorized by:   Etta Grandchild MD   Signed by:   Etta Grandchild MD on 10/23/2009   Method used:   Print then Give to Patient   RxID:   [id number]

## 2010-05-05 NOTE — Letter (Signed)
Summary: [facility] Instructions  [facility]  [address] [place], [place] [address]   Phone: [phone]  Fax: [phone]       [name]    1950-05-09    MRN: [id number]        Procedure Day Dorna Bloom:  [facility]  12/15/09     Arrival Time:  10:30AM     Procedure Time:  11:30AM     Location of Procedure:                    _X _  [facility] (4th Floor)                      PREPARATION FOR COLONOSCOPY WITH MOVIPREP   Starting 5 days prior to your procedure 12/10/09 do not eat nuts, seeds, popcorn, corn, beans, peas,  salads, or any raw vegetables.  Do not take any fiber supplements (e.g. Metamucil, Citrucel, and Benefiber).  THE DAY BEFORE YOUR PROCEDURE         DATE: 12/14/09   DAY: SUNDAY  1.  Drink clear liquids the entire day-NO SOLID FOOD  2.  Do not drink anything colored red or purple.  Avoid juices with pulp.  No orange juice.  3.  Drink at least 64 oz. (8 glasses) of fluid/clear liquids during the day to prevent dehydration and help the prep work efficiently.  CLEAR LIQUIDS INCLUDE: Water Jello Ice Popsicles Tea (sugar ok, no milk/cream) Powdered fruit flavored drinks Coffee (sugar ok, no milk/cream) Gatorade Juice: apple, white grape, white cranberry  Lemonade Clear bullion, consomm, broth Carbonated beverages (any kind) Strained chicken noodle soup Hard Candy                             4.  In the morning, mix first dose of MoviPrep solution:    Empty 1 Pouch A and 1 Pouch B into the disposable container    Add lukewarm drinking water to the top line of the container. Mix to dissolve    Refrigerate (mixed solution should be used within 24 hrs)  5.  Begin drinking the prep at 5:00 p.m. The MoviPrep container is divided by 4 marks.   Every 15 minutes drink the solution down to the next mark (approximately 8 oz) until the full liter is complete.   6.  Follow completed prep with 16 oz of clear liquid of your choice (Nothing  red or purple).  Continue to drink clear liquids until bedtime.  7.  Before going to bed, mix second dose of MoviPrep solution:    Empty 1 Pouch A and 1 Pouch B into the disposable container    Add lukewarm drinking water to the top line of the container. Mix to dissolve    Refrigerate  THE DAY OF YOUR PROCEDURE      DATE: 12/15/09  DAY: MONDAY  Beginning at 6:30AM (5 hours before procedure):         1. Every 15 minutes, drink the solution down to the next mark (approx 8 oz) until the full liter is complete.  2. Follow completed prep with 16 oz. of clear liquid of your choice.    3. You may drink clear liquids until 9:30AM (2 HOURS BEFORE PROCEDURE).   MEDICATION INSTRUCTIONS  Unless otherwise instructed, you should take regular prescription medications with a small sip of water   as early as possible the morning of  your procedure.  Diabetic patients - see separate instructions.   Additional medication instructions: Hold Diovan-HCT the morning of procedure.         OTHER INSTRUCTIONS  You will need a responsible adult at least 61 years of age to accompany you and drive you home.   This person must remain in the waiting room during your procedure.  Wear loose fitting clothing that is easily removed.  Leave jewelry and other valuables at home.  However, you may wish to bring a book to read or  an iPod/MP3 player to listen to music as you wait for your procedure to start.  Remove all body piercing jewelry and leave at home.  Total time from sign-in until discharge is approximately 2-3 hours.  You should go home directly after your procedure and rest.  You can resume normal activities the  day after your procedure.  The day of your procedure you should not:   Drive   Make legal decisions   Operate machinery   Drink alcohol   Return to work  You will receive specific instructions about eating, activities and medications before you leave.    The above  instructions have been reviewed and explained to me by   Wyona Almas RN  December 01, 2009 9:27 AM     I fully understand and can verbalize these instructions _____________________________ Date _________

## 2010-05-05 NOTE — Letter (Signed)
Summary: Diabetic Instructions  [facility]  [address] [place], [place] [address]   Phone: [phone]  Fax: [phone]    [name] 12/09/1950 MRN: [id number]   _x  _   ORAL DIABETIC MEDICATION INSTRUCTIONS  The day before your procedure:   Take your diabetic pill as you do normally  The day of your procedure:   Do not take your diabetic pill    We will check your blood sugar levels during the admission process and again in Recovery before discharging you home  ________________________________________________________________________

## 2010-05-05 NOTE — Assessment & Plan Note (Signed)
Summary: np6/abn ekg/jml   Visit Type:  new pt visit Referring Provider:  Etta Brock Primary Provider:  Etta Grandchild MD  CC:  pt here for abnormal EKG from Dr. Marcello Moores office..pt states Dr. Yetta Barre told him he was going to have a stress test on the day of his appt w/Dr. Clifton James today....  History of Present Illness: 60 yo AAM with history of DM, HTN, hyperlipidemia who is here today as a new cardiac Brock. He is followed by Dr. Yetta Barre in primary care. He has been having episodes of chest pain located in the substernum. Pain lasts for several minutes to several hours. Described as a heaviness and pressure. Seemed to be improved with belching. No associated diaphoresis but the pain was worsened with deep inspirations. No exertional chest pain or dyspnea. No prior cardiac problems. He reportedly had a cardiac cath with Dr. Allyson Sabal of [facility] 5 years ago and was told it was normal. (No record of this). He was most recently seen by Dr. Allyson Sabal 6 months ago. No recent stress testing since his cath 5 years ago.  EKG in Dr. Yetta Barre office with poor R wave progression through the precordial leads, cannot exclude anterior infarct.   Current Medications (verified): 1)  Diovan Hct 320-25 Mg Tabs (Valsartan-Hydrochlorothiazide) .... Take 1 Tablet By Mouth Once A Day 2)  Tekturna 300 Mg Tabs (Aliskiren Fumarate) .... Once Daily For High Blood Pressure 3)  Glimepiride 4 Mg Tabs (Glimepiride) .... Take 1 Tablet By Mouth Once A Day 4)  Aspirin 81 Mg  Tabs (Aspirin) .Marland Kitchen.. 1 Tab Weekly 5)  Metformin Hcl 500 Mg Tabs (Metformin Hcl) .... One By Mouth Three Times A Day For Diabetes 6)  Prilosec Otc 20 Mg Tbec (Omeprazole Magnesium) .... Once Daily For Heartburn 7)  Bupropion Hcl 150 Mg Xr24h-Tab (Bupropion Hcl) .... Once Daily For Depression Pt States He Takes When He Remembers  Allergies (verified): 1)  ! * Statins  Past History:  Past Medical History: Reviewed history from  08/23/2008 and no changes required. Anemia-NOS Diabetes mellitus, type II GERD Hyperlipidemia Hypertension Osteoarthritis Depression  Past Surgical History: pilonidal  cyst removal shoulder surgery right  Family History: Family History of Alcoholism/Addiction Family History of CAD Male 1st degree relative <50 Family History Diabetes 1st degree relative Family History of Stroke F 1st degree relative <60 Family History of Sudden Death  Mother alive, healthy Father deceased age 52 from gangrene arm 4 sisters all alive, no CAD 2 brothers alive, no CAD 2 uncles in their 25s with CAD, CABG  Social History: Retired-Worked for city of [place] Married, 4 children Smoked for 10 years, none since 1985.  Alcohol use-occasional Drug use-no Regular exercise-no  Review of Systems       The Brock complains of chest pain.  The Brock denies fatigue, malaise, fever, weight gain/loss, vision loss, decreased hearing, hoarseness, palpitations, shortness of breath, prolonged cough, wheezing, sleep apnea, coughing up blood, abdominal pain, blood in stool, nausea, vomiting, diarrhea, heartburn, incontinence, blood in urine, muscle weakness, joint pain, leg swelling, rash, skin lesions, headache, fainting, dizziness, depression, anxiety, enlarged lymph nodes, easy bruising or bleeding, and environmental allergies.    Vital Signs:  Brock profile:   60 year old male Height:      70.5 inches Weight:      250 pounds BMI:     35.49 Pulse rate:   79 / minute Pulse rhythm:   irregular BP sitting:   142 / 86  (  left arm) Cuff size:   large  Vitals Entered By: Danielle Rankin, CMA (November 18, 2009 3:47 PM)  Physical Exam  General:  General: Well developed, well nourished, NAD HEENT: OP clear, mucus membranes moist SKIN: warm, dry Neuro: No focal deficits Musculoskeletal: Muscle strength 5/5 all ext Psychiatric: Mood and affect normal Neck: No JVD, no carotid bruits, no thyromegaly, no  lymphadenopathy. Lungs:Clear bilaterally, no wheezes, rhonci, crackles CV: RRR no murmurs, gallops rubs Abdomen: soft, NT, ND, BS present Extremities: No edema, pulses 2+.    EKG  Procedure date:  11/18/2009  Findings:      NSR, rate 79 bpm. Poor R wave progression through the precordial leads.   Impression & Recommendations:  Problem # 1:  CHEST PAIN-PRECORDIAL (ICD-786.51)  Pt has multiple cardiac risk factors including DM, HTN and hyperlipidemia. His EKG has non-specific changes. His chest pain has typical and atypical features. He does have a family history of CAD. I think an evaluation is warranted with a stress test. He will be randomized into the PROMISE study protocol and will actually receive a coronary CT angiogram as part of the study. I will see him in several weeks to review.   His updated medication list for this problem includes:    Aspirin 81 Mg Tabs (Aspirin) .Marland Kitchen... 1 tab weekly  Orders: EKG w/ Interpretation (93000)  Other Orders: Cardiac CTA (Cardiac CTA)  Brock Instructions: 1)  Your physician recommends that you schedule a follow-up appointment in: 2-3 weeks 2)  Your physician recommends that you continue on your current medications as directed. Please refer to the Current Medication list given to you today. 3)  Your physician has requested that you have a cardiac CT.  Cardiac computed tomography (CT) is a painless test that uses an x-ray machine to take clear, detailed pictures of your heart.  For further information please visit [email or web address]  Please follow instruction sheet as given. Do not take metformin evening before and morning of CT and for 48 hours after procedure.

## 2010-05-06 ENCOUNTER — Telehealth (INDEPENDENT_AMBULATORY_CARE_PROVIDER_SITE_OTHER): Payer: Self-pay | Admitting: *Deleted

## 2010-05-07 NOTE — Progress Notes (Signed)
Summary: medication alternative  Phone Note Call from Patient Call back at Home Phone [phone]   Caller: Patient- ok to leave messg on vm Summary of Call: Per patient, he was recently placed on lithum by another provider and is concerned about the effects that is has on kidney (discussed at office visit). He would like to know want MD would suggest as an alternative for this medication. Please advise thanks.Marland KitchenMarland KitchenAlvy Beal Archie CMA  April 24, 2010 12:19 PM   Follow-up for Phone Call        I have no recommendations to make about this issue Follow-up by: Etta Grandchild MD,  April 24, 2010 12:36 PM  Additional Follow-up for Phone Call Additional follow up Details #1::        Patient notified and will discuss with other provider.Alvy Beal Archie CMA  April 27, 2010 11:26 AM

## 2010-05-07 NOTE — Assessment & Plan Note (Signed)
Summary: 2 MONTH FOLLOW UP-LB   Vital Signs:  Patient profile:   60 year old male Height:      70.5 inches Weight:      255 pounds BMI:     36.20 O2 Sat:      95 % on Room air Temp:     98.7 degrees F oral Pulse rate:   63 / minute Pulse rhythm:   regular Resp:     16 per minute BP sitting:   142 / 84  (left arm) Cuff size:   large  Vitals Entered By: Rock Nephew CMA (April 24, 2010 11:29 AM)  Nutrition Counseling: Patient's BMI is greater than 25 and therefore counseled on weight management options.  O2 Flow:  Room air CC: follow-up visit, Is Patient Diabetic? Yes Did you bring your meter with you today? No Pain Assessment Patient in pain? no        Primary Care Provider:  Etta Grandchild MD  CC:  follow-up visit and .  History of Present Illness:  Follow-Up Visit      This is a 60 year old man who presents for Follow-up visit.  The patient denies chest pain, palpitations, dizziness, syncope, low blood sugar symptoms, high blood sugar symptoms, edema, SOB, DOE, PND, and orthopnea.  Since the last visit the patient notes no new problems or concerns.  The patient reports taking meds as prescribed, not monitoring BP, not monitoring blood sugars, and dietary noncompliance.  When questioned about possible medication side effects, the patient notes none.    He asks me to set him up for an EGD - he tells me that had an EGD done 7 years ago and that he had "growths" in his stomach and he is due for another EGD.  Dyspepsia History:      He has no alarm features of dyspepsia including no history of melena, hematochezia, dysphagia, persistent vomiting, or involuntary weight loss > 5%.  There is a prior history of GERD.  The patient does not have a prior history of documented ulcer disease.  The dominant symptom is heartburn or acid reflux.  An H-2 blocker medication is currently being taken.  He notes that the symptoms have improved with the H-2 blocker therapy.  Symptoms have  not persisted after 4 weeks of H-2 blocker treatment.     Current Medications (verified): 1)  Diovan Hct 320-25 Mg Tabs (Valsartan-Hydrochlorothiazide) .... Take 1 Tablet By Mouth Once A Day 2)  Tekturna 300 Mg Tabs (Aliskiren Fumarate) .... Once Daily For High Blood Pressure 3)  Glimepiride 4 Mg Tabs (Glimepiride) .... Take 1 Tablet By Mouth Once A Day 4)  Aspirin 81 Mg  Tabs (Aspirin) .Marland Kitchen.. 1 Tab Weekly 5)  Metformin Hcl 500 Mg Tabs (Metformin Hcl) .... One By Mouth Three Times A Day For Diabetes 6)  Prilosec Otc 20 Mg Tbec (Omeprazole Magnesium) .... Once Daily For Heartburn 7)  Bupropion Hcl 150 Mg Xr24h-Tab (Bupropion Hcl) .... Once Daily For Depression Pt States He Takes When He Remembers  Allergies (verified): 1)  ! * Statins  Past History:  Past Medical History: Last updated: 08/23/2008 Anemia-NOS Diabetes mellitus, type II GERD Hyperlipidemia Hypertension Osteoarthritis Depression  Past Surgical History: Last updated: 11/18/2009 pilonidal  cyst removal shoulder surgery right  Family History: Last updated: 11/18/2009 Family History of Alcoholism/Addiction Family History of CAD Male 1st degree relative <50 Family History Diabetes 1st degree relative Family History of Stroke F 1st degree relative <60 Family History of  Sudden Death  Mother alive, healthy Father deceased age 73 from gangrene arm 4 sisters all alive, no CAD 2 brothers alive, no CAD 2 uncles in their 39s with CAD, CABG  Social History: Last updated: 11/18/2009 Retired-Worked for city of [place] Married, 4 children Smoked for 10 years, none since 1985.  Alcohol use-occasional Drug use-no Regular exercise-no  Risk Factors: Alcohol Use: <1 (01/16/2010) >5 drinks/d w/in last 3 months: no (01/16/2010) Exercise: no (08/23/2008)  Risk Factors: Smoking Status: never (01/16/2010)  Family History: Reviewed history from 11/18/2009 and no changes required. Family History of  Alcoholism/Addiction Family History of CAD Male 1st degree relative <50 Family History Diabetes 1st degree relative Family History of Stroke F 1st degree relative <60 Family History of Sudden Death  Mother alive, healthy Father deceased age 80 from gangrene arm 4 sisters all alive, no CAD 2 brothers alive, no CAD 2 uncles in their 96s with CAD, CABG  Social History: Reviewed history from 11/18/2009 and no changes required. Retired-Worked for city of [place] Married, 4 children Smoked for 10 years, none since 1985.  Alcohol use-occasional Drug use-no Regular exercise-no  Review of Systems       The patient complains of weight gain.  The patient denies anorexia, fever, weight loss, chest pain, syncope, dyspnea on exertion, peripheral edema, prolonged cough, headaches, hemoptysis, abdominal pain, melena, hematochezia, severe indigestion/heartburn, hematuria, suspicious skin lesions, difficulty walking, depression, and enlarged lymph nodes.   Psych:  Denies anxiety, depression, easily angered, easily tearful, irritability, mental problems, panic attacks, sense of great danger, suicidal thoughts/plans, and thoughts of violence. Endo:  Denies cold intolerance, excessive hunger, excessive thirst, excessive urination, heat intolerance, polyuria, and weight change.  Physical Exam  General:  alert, well-developed, well-nourished, well-hydrated, appropriate dress, normal appearance, healthy-appearing, cooperative to examination, and overweight-appearing.   Head:  normocephalic, atraumatic, no abnormalities observed, and no abnormalities palpated.   Eyes:  vision grossly intact, pupils equal, pupils round, and pupils reactive to light.   Ears:  R ear normal and L ear normal.   Mouth:  Oral mucosa and oropharynx without lesions or exudates.  Teeth in good repair. Neck:  supple, full ROM, no masses, no thyromegaly, no thyroid nodules or tenderness, no JVD, normal carotid upstroke, no carotid  bruits, no cervical lymphadenopathy, and no neck tenderness.   Lungs:  normal respiratory effort, no intercostal retractions, no accessory muscle use, normal breath sounds, no dullness, no fremitus, no crackles, and no wheezes.   Heart:  normal rate, regular rhythm, no murmur, no gallop, no rub, and no JVD.   Abdomen:  soft, non-tender, normal bowel sounds, no distention, no masses, no guarding, no rigidity, no rebound tenderness, no abdominal hernia, no inguinal hernia, no hepatomegaly, and no splenomegaly.   Msk:  No deformity or scoliosis noted of thoracic or lumbar spine.   Pulses:  R and L carotid,radial,femoral,dorsalis pedis and posterior tibial pulses are full and equal bilaterally Extremities:  No clubbing, cyanosis, edema, or deformity noted with normal full range of motion of all joints.   Neurologic:  No cranial nerve deficits noted. Station and gait are normal. Plantar reflexes are down-going bilaterally. DTRs are symmetrical throughout. Sensory, motor and coordinative functions appear intact. Skin:  Intact without suspicious lesions or rashes Cervical Nodes:  no anterior cervical adenopathy and no posterior cervical adenopathy.   Psych:  Cognition and judgment appear intact. Alert and cooperative with normal attention span and concentration. No apparent delusions, illusions, hallucinations  Diabetes Management Exam:  Foot Exam (with socks and/or shoes not present):       Sensory-Pinprick/Light touch:          Left medial foot (L-4): normal          Left dorsal foot (L-5): normal          Left lateral foot (S-1): normal          Right medial foot (L-4): normal          Right dorsal foot (L-5): normal          Right lateral foot (S-1): normal       Sensory-Monofilament:          Left foot: normal          Right foot: normal       Inspection:          Left foot: normal          Right foot: normal       Nails:          Left foot: normal          Right foot:  normal   Impression & Recommendations:  Problem # 1:  HYPERTENSION (ICD-401.9) Assessment Unchanged  His updated medication list for this problem includes:    Diovan Hct 320-25 Mg Tabs (Valsartan-hydrochlorothiazide) .Marland Kitchen... Take 1 tablet by mouth once a day    Tekturna 300 Mg Tabs (Aliskiren fumarate) ..... Once daily for high blood pressure  Orders: Venipuncture ([address]) TLB-Lipid Panel (80061-LIPID) TLB-BMP (Basic Metabolic Panel-BMET) (80048-METABOL) TLB-CBC Platelet - w/Differential (85025-CBCD) TLB-Hepatic/Liver Function Pnl (80076-HEPATIC) TLB-TSH (Thyroid Stimulating Hormone) (84443-TSH) TLB-A1C / Hgb A1C (Glycohemoglobin) (83036-A1C)  BP today: 142/84 Prior BP: 130/82 (01/16/2010)  Prior 10 Yr Risk Heart Disease: Not enough information (08/23/2008)  Labs Reviewed: K+: 4.3 (01/16/2010) Creat: : 1.0 (01/16/2010)   Chol: 204 (10/23/2009)   HDL: 37.60 (10/23/2009)   TG: 80.0 (10/23/2009)  Problem # 2:  HYPERLIPIDEMIA (ICD-272.4) Assessment: Unchanged  His updated medication list for this problem includes:    Livalo 2 Mg Tabs (Pitavastatin calcium) ..... One by mouth once daily for cholesterol  Orders: Venipuncture (52841) TLB-Lipid Panel (80061-LIPID) TLB-BMP (Basic Metabolic Panel-BMET) (80048-METABOL) TLB-CBC Platelet - w/Differential (85025-CBCD) TLB-Hepatic/Liver Function Pnl (80076-HEPATIC) TLB-TSH (Thyroid Stimulating Hormone) (84443-TSH) TLB-A1C / Hgb A1C (Glycohemoglobin) (83036-A1C)  Labs Reviewed: SGOT: 17 (01/16/2010)   SGPT: 21 (01/16/2010)  Lipid Goals: Chol Goal: 200 (08/23/2008)   HDL Goal: 40 (08/23/2008)   LDL Goal: 100 (08/23/2008)   TG Goal: 150 (08/23/2008)  Prior 10 Yr Risk Heart Disease: Not enough information (08/23/2008)   HDL:37.60 (10/23/2009), 43.00 (05/20/2009)  Chol:204 (10/23/2009), 215 (05/20/2009)  Trig:80.0 (10/23/2009), 91.0 (05/20/2009)  Problem # 3:  GERD (ICD-530.81) Assessment: Unchanged  His updated medication list for  this problem includes:    Prilosec Otc 20 Mg Tbec (Omeprazole magnesium) ..... Once daily for heartburn  Orders: Gastroenterology Referral (GI) Venipuncture (32440) TLB-Lipid Panel (80061-LIPID) TLB-BMP (Basic Metabolic Panel-BMET) (80048-METABOL) TLB-CBC Platelet - w/Differential (85025-CBCD) TLB-Hepatic/Liver Function Pnl (80076-HEPATIC) TLB-TSH (Thyroid Stimulating Hormone) (84443-TSH) TLB-A1C / Hgb A1C (Glycohemoglobin) (83036-A1C)  Labs Reviewed: Hgb: 13.5 (10/23/2009)   Hct: 40.8 (10/23/2009)  Problem # 4:  DIABETES MELLITUS, TYPE II (ICD-250.00) Assessment: Unchanged  His updated medication list for this problem includes:    Diovan Hct 320-25 Mg Tabs (Valsartan-hydrochlorothiazide) .Marland Kitchen... Take 1 tablet by mouth once a day    Glimepiride 4 Mg Tabs (Glimepiride) .Marland Kitchen... Take 1 tablet by mouth once a day    Aspirin 81 Mg Tabs (  Aspirin) .Marland Kitchen... 1 tab weekly    Metformin Hcl 500 Mg Tabs (Metformin hcl) ..... One by mouth three times a day for diabetes  Orders: Venipuncture (29528) TLB-Lipid Panel (80061-LIPID) TLB-BMP (Basic Metabolic Panel-BMET) (80048-METABOL) TLB-CBC Platelet - w/Differential (85025-CBCD) TLB-Hepatic/Liver Function Pnl (80076-HEPATIC) TLB-TSH (Thyroid Stimulating Hormone) (84443-TSH) TLB-A1C / Hgb A1C (Glycohemoglobin) (83036-A1C)  Labs Reviewed: Creat: 1.0 (01/16/2010)     Last Eye Exam: normal (10/22/2009) Reviewed HgBA1c results: 7.1 (01/16/2010)  7.0 (10/23/2009)  Complete Medication List: 1)  Diovan Hct 320-25 Mg Tabs (Valsartan-hydrochlorothiazide) .... Take 1 tablet by mouth once a day 2)  Tekturna 300 Mg Tabs (Aliskiren fumarate) .... Once daily for high blood pressure 3)  Glimepiride 4 Mg Tabs (Glimepiride) .... Take 1 tablet by mouth once a day 4)  Aspirin 81 Mg Tabs (Aspirin) .Marland Kitchen.. 1 tab weekly 5)  Metformin Hcl 500 Mg Tabs (Metformin hcl) .... One by mouth three times a day for diabetes 6)  Prilosec Otc 20 Mg Tbec (Omeprazole magnesium) ....  Once daily for heartburn 7)  Bupropion Hcl 150 Mg Xr24h-tab (Bupropion hcl) .... Once daily for depression pt states he takes when he remembers 8)  Livalo 2 Mg Tabs (Pitavastatin calcium) .... One by mouth once daily for cholesterol  Dyspepsia Assessment/Plan:  Step Therapy: GERD Treatment Protocols:    Step-1: started  Patient Instructions: 1)  Please schedule a follow-up appointment in 3 months. 2)  Avoid foods high in acid (tomatoes, citrus juices, spicy foods). Avoid eating within two hours of lying down or before exercising. Do not over eat; try smaller more frequent meals. Elevate head of bed twelve inches when sleeping. 3)  It is important that you exercise regularly at least 20 minutes 5 times a week. If you develop chest pain, have severe difficulty breathing, or feel very tired , stop exercising immediately and seek medical attention. 4)  You need to lose weight. Consider a lower calorie diet and regular exercise.  5)  Check your blood sugars regularly. If your readings are usually above 200 or below 70 you should contact our office. 6)  It is important that your Diabetic A1c level is checked every 3 months. 7)  See your eye doctor yearly to check for diabetic eye damage. 8)  Check your feet each night for sore areas, calluses or signs of infection. 9)  Check your Blood Pressure regularly. If it is above 130/80: you should make an appointment. Prescriptions: LIVALO 2 MG TABS (PITAVASTATIN CALCIUM) One by mouth once daily for cholesterol  #70 x 0   Entered and Authorized by:   Etta Grandchild MD   Signed by:   Etta Grandchild MD on 04/24/2010   Method used:   Samples Given   RxID:   [id number]    Orders Added: 1)  Gastroenterology Referral [GI] 2)  Venipuncture [44034] 3)  TLB-Lipid Panel [80061-LIPID] 4)  TLB-BMP (Basic Metabolic Panel-BMET) [80048-METABOL] 5)  TLB-CBC Platelet - w/Differential [85025-CBCD] 6)  TLB-Hepatic/Liver Function Pnl [80076-HEPATIC] 7)   TLB-TSH (Thyroid Stimulating Hormone) [84443-TSH] 8)  TLB-A1C / Hgb A1C (Glycohemoglobin) [83036-A1C] 9)  Est. Patient Level IV [74259]    Prevention & Chronic Care Immunizations   Influenza vaccine: Not documented   Influenza vaccine deferral: Refused  (04/24/2010)    Tetanus booster: 08/23/2008: Tdap    Pneumococcal vaccine: Pneumovax  (08/23/2008)    H. zoster vaccine: Not documented   H. zoster vaccine deferral: Refused  (04/24/2010)  Colorectal Screening   Hemoccult: Not documented   Hemoccult  action/deferral: NEG X 1 today  (10/23/2009)    Colonoscopy: DONE  (12/15/2009)   Colonoscopy action/deferral: scheduled with G.I.  (10/23/2009)   Colonoscopy due: 12/2014  Other Screening   PSA: 1.51  (10/23/2009)   PSA action/deferral: PSA ordered  (10/23/2009)   Smoking status: never  (01/16/2010)  Diabetes Mellitus   HgbA1C: 7.1  (01/16/2010)    Eye exam: normal  (10/22/2009)   Eye exam due: 11/2010    Foot exam: yes  (04/24/2010)   High risk foot: Not documented   Foot care education: Not documented    Urine microalbumin/creatinine ratio: 3.1  (08/23/2008)  Lipids   Total Cholesterol: 204  (10/23/2009)   LDL: Not documented   LDL Direct: 159.1  (10/23/2009)   HDL: 37.60  (10/23/2009)   Triglycerides: 80.0  (10/23/2009)    SGOT (AST): 17  (01/16/2010)   SGPT (ALT): 21  (01/16/2010)   Alkaline phosphatase: 92  (01/16/2010)   Total bilirubin: 0.5  (01/16/2010)  Hypertension   Last Blood Pressure: 142 / 84  (04/24/2010)   Serum creatinine: 1.0  (01/16/2010)   Serum potassium 4.3  (01/16/2010)  Self-Management Support :    Diabetes self-management support: Not documented    Hypertension self-management support: Not documented    Lipid self-management support: Not documented

## 2010-05-07 NOTE — Letter (Signed)
Summary: New Patient letter  [facility]  [address]   [place], [place] [address]   Phone: [phone]  Fax: [phone]       04/21/2010 MRN: [id number]  [name] [address] [place], [place]  [address]  Dear [name],  Welcome to the Gastroenterology Division at [facility].    You are scheduled to see Dr.  Christella Hartigan on 06/08/2010 at 9:15 on the 3rd floor at [facility], [address]  We ask that you try to arrive at our office 15 minutes prior to your appointment time to allow for check-in.  We would like you to complete the enclosed self-administered evaluation form prior to your visit and bring it with you on the day of your appointment.  We will review it with you.  Also, please bring a complete list of all your medications or, if you prefer, bring the medication bottles and we will list them.  Please bring your insurance card so that we may make a copy of it.  If your insurance requires a referral to see a specialist, please bring your referral form from your primary care physician.  Co-payments are due at the time of your visit and may be paid by cash, check or credit card.     Your office visit will consist of a consult with your physician (includes a physical exam), any laboratory testing he/she may order, scheduling of any necessary diagnostic testing (e.g. x-ray, ultrasound, CT-scan), and scheduling of a procedure (e.g. Endoscopy, Colonoscopy) if required.  Please allow enough time on your schedule to allow for any/all of these possibilities.    If you cannot keep your appointment, please call [phone] to cancel or reschedule prior to your appointment date.  This allows [place] the opportunity to schedule an appointment for another patient in need of care.  If you do not cancel or reschedule by 5 p.m. the business day prior to your appointment date, you will be charged a $50.00 late cancellation/no-show fee.    Thank you for choosing  [facility] for your medical needs.  We appreciate the opportunity to care for you.  Please visit [place] at our website  to learn more about our practice.                     Sincerely,                                                             The Gastroenterology Division

## 2010-05-07 NOTE — Letter (Signed)
Summary: Lipid Letter  [facility]  [address] [place], [place] [address]   Phone: [phone]  Fax: [phone]    04/24/2010  [name] [address] [place], [place]  [address]  Dear [name]:  We have carefully reviewed your last lipid profile from  and the results are noted below with a summary of recommendations for lipid management.    Cholesterol:       208     Goal: <200   HDL "good" Cholesterol:   40.10     Goal: >40   LDL "bad" Cholesterol:   163     Goal: <100   Triglycerides:       68.0     Goal: <150        TLC Diet (Therapeutic Lifestyle Change): Saturated Fats & Transfatty acids should be kept < 7% of total calories ***Reduce Saturated Fats Polyunstaurated Fat can be up to 10% of total calories Monounsaturated Fat Fat can be up to 20% of total calories Total Fat should be no greater than 25-35% of total calories Carbohydrates should be 50-60% of total calories Protein should be approximately 15% of total calories Fiber should be at least 20-30 grams a day ***Increased fiber may help lower LDL Total Cholesterol should be < 200mg /day Consider adding plant stanol/sterols to diet (example: Benacol spread) ***A higher intake of unsaturated fat may reduce Triglycerides and Increase HDL    Adjunctive Measures (may lower LIPIDS and reduce risk of Heart Attack) include: Aerobic Exercise (20-30 minutes 3-4 times a week) Limit Alcohol Consumption Weight Reduction Aspirin 75-81 mg a day by mouth (if not allergic or contraindicated) Dietary Fiber 20-30 grams a day by mouth     Current Medications: 1)    Diovan Hct 320-25 Mg Tabs (Valsartan-hydrochlorothiazide) .... Take 1 tablet by mouth once a day 2)    Tekturna 300 Mg Tabs (Aliskiren fumarate) .... Once daily for high blood pressure 3)    Glimepiride 4 Mg Tabs (Glimepiride) .... Take 1 tablet by mouth once a day 4)    Aspirin 81 Mg  Tabs (Aspirin) .Marland Kitchen.. 1 tab weekly 5)    Metformin Hcl 500 Mg  Tabs (Metformin hcl) .... One by mouth three times a day for diabetes 6)    Prilosec Otc 20 Mg Tbec (Omeprazole magnesium) .... Once daily for heartburn 7)    Bupropion Hcl 150 Mg Xr24h-tab (Bupropion hcl) .... Once daily for depression pt states he takes when he remembers 8)    Livalo 2 Mg Tabs (Pitavastatin calcium) .... One by mouth once daily for cholesterol  If you have any questions, please call. We appreciate being able to work with you.   Sincerely,    [facility] Ronald Grandchild MD

## 2010-05-07 NOTE — Progress Notes (Signed)
  Phone Note Other Incoming   Request: Send information Summary of Call: Request for records received from [facility]. Request forwarded to [facility].     

## 2010-05-13 NOTE — Progress Notes (Signed)
  Phone Note Other Incoming   Request: Send information Summary of Call: Request for records received from  [facility]. Request forwarded to [facility].  04/2008-[place]

## 2010-05-22 ENCOUNTER — Telehealth: Payer: Self-pay | Admitting: Internal Medicine

## 2010-06-02 NOTE — Progress Notes (Signed)
Summary: ?med change  Phone Note From Other Clinic Call back at [phone]   Caller: Marylin Crosby at [facility] Summary of Call: PA called regarding change in pt's meds-Pt has been taking Lithium for Psych. sxs but medication has caused pt's BP to become elevated. PA wants to change med from Lithium to Depakote but called because of drug interaction between Depakote and Glimepiride-okay to switch to Depakote?-please advise (PA aware Dr. Yetta Barre is out of office until Monday) Initial call taken by: Brenton Grills CMA Duncan Dull),  May 22, 2010 4:22 PM  Follow-up for Phone Call        I am NOT offering an opinion about whether or not he needs depakote but IF he decides to start taking it he will need to check his blood suagrs because the combo may potentiate the effects of glimeperide and lower his blood sugars Follow-up by: Etta Grandchild MD,  May 25, 2010 11:10 AM  Additional Follow-up for Phone Call Additional follow up Details #1::        Left mess w/[facility] receptionist who will inform the PA of above.  Additional Follow-up by: Lamar Sprinkles, CMA,  May 25, 2010 3:06 PM

## 2010-06-08 ENCOUNTER — Encounter: Payer: Self-pay | Admitting: Gastroenterology

## 2010-06-08 ENCOUNTER — Ambulatory Visit (INDEPENDENT_AMBULATORY_CARE_PROVIDER_SITE_OTHER): Payer: 59 | Admitting: Gastroenterology

## 2010-06-08 ENCOUNTER — Ambulatory Visit: Payer: Self-pay | Admitting: Gastroenterology

## 2010-06-08 DIAGNOSIS — R12 Heartburn: Secondary | ICD-10-CM

## 2010-06-08 DIAGNOSIS — Z86010 Personal history of colonic polyps: Secondary | ICD-10-CM

## 2010-06-16 NOTE — Assessment & Plan Note (Signed)
History of Present Illness Visit Type: Initial Consult Primary GI MD: Rob Bunting MD Primary Provider: Sanda Linger, MD Requesting Provider: Sanda Linger, MD Chief Complaint: Increase in acid reflux symptoms with question of having Upper Endoscopy.  History of Present Illness:       very pleasant 60 year old man whom I last saw about 3 months ago at the time of a routine , screening colonoscopy. he recently had a colonoscopy in the past month or two.  he had polyps removed.  Polyp was removed.  this was a tubular adenoma.  was recommended to have a repeat in 5 years.   he came in because he felt he needed an upper endoscopy. He believes this should be done routinely. I think he was told that by previous gastroenterologist.  he has acid reflux symptoms for many years.  belching at times.  he periodically take prilosec. this usually really helps.  he has no dysphagia.  no overt gi bleeding.  overall weight is up lately.  spicey foods, mariinara sauce make it worse.  he has had colonoscopy a few years ago.           Current Medications (verified): 1)  Diovan Hct 320-25 Mg Tabs (Valsartan-Hydrochlorothiazide) .... Take 1 Tablet By Mouth Once A Day 2)  Tekturna 300 Mg Tabs (Aliskiren Fumarate) .... Once Daily For High Blood Pressure 3)  Glimepiride 4 Mg Tabs (Glimepiride) .... Take 1 Tablet By Mouth Once A Day 4)  Aspirin 81 Mg  Tabs (Aspirin) .Marland Kitchen.. 1 Tab Weekly 5)  Metformin Hcl 500 Mg Tabs (Metformin Hcl) .... One By Mouth Three Times A Day For Diabetes 6)  Prilosec Otc 20 Mg Tbec (Omeprazole Magnesium) .... Once Daily For Heartburn 7)  Bupropion Hcl 150 Mg Xr24h-Tab (Bupropion Hcl) .... Once Daily For Depression Pt States He Takes When He Remembers 8)  Livalo 2 Mg Tabs (Pitavastatin Calcium) .... One By Mouth Once Daily For Cholesterol  Allergies (verified): 1)  ! * Statins  Past History:  Past Medical History: Anemia-NOS Diabetes mellitus, type  II GERD Hyperlipidemia Hypertension Osteoarthritis Depression  Adenomatous colon polyps , colonoscopy 2011,  next colonoscopy at 5 year interval  Past Surgical History: pilonidal  cyst removal shoulder surgery right   Family History: Family History of Alcoholism/Addiction Family History of CAD Male 1st degree relative <50 Family History Diabetes 1st degree relative Family History of Stroke F 1st degree relative <60 Family History of Sudden Death  Mother alive, healthy Father deceased age 13 from gangrene arm 4 sisters all alive, no CAD 2 brothers alive, no CAD 2 uncles in their 71s with CAD, CABG    Social History: Retired-Worked for city [employer] [place] Married, 4 children Smoked for 10 years, none since 1985.  Alcohol use-occasional Drug use-no Regular exercise-no    Review of Systems       Pertinent positive and negative review of systems were noted in the above HPI and GI specific review of systems.  All other review of systems was otherwise negative.   Vital Signs:  Patient profile:   60 year old male Height:      70.5 inches Weight:      263 pounds BMI:     37.34 Pulse rate:   66 / minute Pulse rhythm:   regular BP sitting:   152 / 84  (left arm) Cuff size:   large  Vitals Entered By: Christie Nottingham CMA Duncan Dull) (June 08, 2010 9:09 AM)  Physical Exam  Additional Exam:  Constitutional: generally well appearing Psychiatric: alert and oriented times 3 Eyes: extraocular movements intact Mouth: oropharynx moist, no lesions Neck: supple, no lymphadenopathy Cardiovascular: heart regular rate and rythm Lungs: CTA bilaterally Abdomen: soft, non-tender, non-distended, no obvious ascites, no peritoneal signs, normal bowel sounds Extremities: no lower extremity edema bilaterally Skin: no lesions on visible extremities    Impression & Recommendations:  Problem # 1:   previous adenomatous colon polyps  he is already set up for a recall colonoscopy at 5 year  interval  Problem # 2:   GERD  he has no alarm symptoms and when necessary proton pump inhibitor seems to help his symptoms very well. He will continue this or increase to daily , scheduled dosing. I see no reason for endoscopy at this point.  Patient Instructions: 1)  You should change the way you are taking your antiacid medicine ( prilosec) so that you are taking it 20-30 minutes prior to a decent meal as that is the way the pill is designed to work most effectively. 2)  You do not need repeat EGD or labs at this point. 3)  Call Dr. Christella Hartigan if you have recurrent trouble despite daily prilosec. 4)  The medication list was reviewed and reconciled.  All changed / newly prescribed medications were explained.  A complete medication list was provided to the patient / caregiver.

## 2010-06-18 LAB — GLUCOSE, CAPILLARY
Glucose-Capillary: 125 mg/dL — ABNORMAL HIGH (ref 70–99)
Glucose-Capillary: 98 mg/dL (ref 70–99)

## 2010-07-19 ENCOUNTER — Other Ambulatory Visit: Payer: Self-pay | Admitting: Internal Medicine

## 2010-07-24 ENCOUNTER — Other Ambulatory Visit (INDEPENDENT_AMBULATORY_CARE_PROVIDER_SITE_OTHER): Payer: 59

## 2010-07-24 ENCOUNTER — Encounter: Payer: Self-pay | Admitting: Internal Medicine

## 2010-07-24 ENCOUNTER — Other Ambulatory Visit (INDEPENDENT_AMBULATORY_CARE_PROVIDER_SITE_OTHER): Payer: 59 | Admitting: Internal Medicine

## 2010-07-24 ENCOUNTER — Ambulatory Visit (INDEPENDENT_AMBULATORY_CARE_PROVIDER_SITE_OTHER): Payer: 59 | Admitting: Internal Medicine

## 2010-07-24 DIAGNOSIS — D649 Anemia, unspecified: Secondary | ICD-10-CM

## 2010-07-24 DIAGNOSIS — E119 Type 2 diabetes mellitus without complications: Secondary | ICD-10-CM

## 2010-07-24 DIAGNOSIS — E785 Hyperlipidemia, unspecified: Secondary | ICD-10-CM

## 2010-07-24 DIAGNOSIS — I10 Essential (primary) hypertension: Secondary | ICD-10-CM

## 2010-07-24 LAB — CBC WITH DIFFERENTIAL/PLATELET
Basophils Absolute: 0 10*3/uL (ref 0.0–0.1)
Basophils Relative: 0.7 % (ref 0.0–3.0)
Eosinophils Absolute: 0.2 10*3/uL (ref 0.0–0.7)
Eosinophils Relative: 2.7 % (ref 0.0–5.0)
HCT: 44.3 % (ref 39.0–52.0)
Hemoglobin: 14.7 g/dL (ref 13.0–17.0)
Lymphocytes Relative: 41.5 % (ref 12.0–46.0)
Lymphs Abs: 2.5 10*3/uL (ref 0.7–4.0)
MCHC: 33.2 g/dL (ref 30.0–36.0)
MCV: 87.7 fl (ref 78.0–100.0)
Monocytes Absolute: 0.6 10*3/uL (ref 0.1–1.0)
Monocytes Relative: 10.6 % (ref 3.0–12.0)
Neutro Abs: 2.7 10*3/uL (ref 1.4–7.7)
Neutrophils Relative %: 44.5 % (ref 43.0–77.0)
Platelets: 205 10*3/uL (ref 150.0–400.0)
RBC: 5.06 Mil/uL (ref 4.22–5.81)
RDW: 14.6 % (ref 11.5–14.6)
WBC: 6.1 10*3/uL (ref 4.5–10.5)

## 2010-07-24 LAB — COMPREHENSIVE METABOLIC PANEL
ALT: 30 U/L (ref 0–53)
AST: 26 U/L (ref 0–37)
Albumin: 4 g/dL (ref 3.5–5.2)
Alkaline Phosphatase: 70 U/L (ref 39–117)
BUN: 14 mg/dL (ref 6–23)
CO2: 31 mEq/L (ref 19–32)
Calcium: 9.4 mg/dL (ref 8.4–10.5)
Chloride: 101 mEq/L (ref 96–112)
Creatinine, Ser: 0.9 mg/dL (ref 0.4–1.5)
GFR: 114.95 mL/min (ref >60.00)
Glucose, Bld: 72 mg/dL (ref 70–99)
Potassium: 4.5 mEq/L (ref 3.5–5.1)
Sodium: 140 mEq/L (ref 135–145)
Total Bilirubin: 0.8 mg/dL (ref 0.3–1.2)
Total Protein: 7.4 g/dL (ref 6.0–8.3)

## 2010-07-24 LAB — LIPID PANEL
Cholesterol: 215 mg/dL — ABNORMAL HIGH (ref 0–200)
HDL: 43.3 mg/dL (ref >39.00)
Total CHOL/HDL Ratio: 5
Triglycerides: 51 mg/dL (ref 0.0–149.0)
VLDL: 10.2 mg/dL (ref 0.0–40.0)

## 2010-07-24 LAB — CK: Total CK: 466 U/L — ABNORMAL HIGH (ref 7–232)

## 2010-07-24 LAB — HEMOGLOBIN A1C: Hgb A1c MFr Bld: 7.2 % — ABNORMAL HIGH (ref 4.6–6.5)

## 2010-07-24 LAB — LDL CHOLESTEROL, DIRECT: Direct LDL: 161.7 mg/dL

## 2010-07-24 LAB — TSH: TSH: 0.64 u[IU]/mL (ref 0.35–5.50)

## 2010-07-24 MED ORDER — PITAVASTATIN CALCIUM 2 MG PO TABS: 1.0000 | ORAL_TABLET | Freq: Every day | ORAL | Status: DC

## 2010-07-24 MED ORDER — GLUCOSE BLOOD VI STRP: ORAL_STRIP | Status: DC

## 2010-07-24 NOTE — Assessment & Plan Note (Signed)
Will stop tekturna due to the FDA warning and continue Diovan HCT, today I will monitor his lytes and renal function

## 2010-07-24 NOTE — Patient Instructions (Signed)
Diabetes, Type 2 Diabetes is a lasting (chronic) disease. In type 2 diabetes, the pancreas does not make enough insulin (a hormone), and the body does not respond normally to the insulin that is made. This type of diabetes was also previously called adult onset diabetes. About 90% of all those who have diabetes have type 2. It usually occurs after the age of 40 but can occur at any age. CAUSES Unlike type 1 diabetes, which happens because insulin is no longer being made, type 2 diabetes happens because the body is making less insulin and has trouble using the insulin properly. SYMPTOMS  Drinking more than usual.   Urinating more than usual.   Blurred vision.   Dry, itchy skin.   Frequent infection like yeast infections in women.   More tired than usual (fatigue).  TREATMENT  Healthy eating.   Exercise.   Medication, if needed.   Monitoring blood glucose (sugar).   Seeing your caregiver regularly.  HOME CARE INSTRUCTIONS  Check your blood glucose (sugar) at least once daily. More frequent monitoring may be necessary, depending on your medications and on how well your diabetes is controlled. Your caregiver will advise you.   Take your medicine as directed by your caregiver.   Do not smoke.   Make wise food choices. Ask your caregiver for information. Weight loss can improve your diabetes.   Learn about low blood glucose (hypoglycemia) and how to treat it.   Get your eyes checked regularly.   Have a yearly physical exam. Have your blood pressure checked. Get your blood and urine tested.   Wear a pendant or bracelet saying that you have diabetes.   Check your feet every night for sores. Let your caregiver know if you have sores that are not healing.  SEEK MEDICAL CARE IF:  You are having problems keeping your blood glucose at target range.   You feel you might be having problems with your medicines.   You have symptoms of an illness that is not improving after 24  hours.   You have a sore or wound that is not healing.   You notice a change in vision or a new problem with your vision.   You develop a fever of more than 100.5.  Document Released: 03/22/2005 Document Re-Released: 04/13/2009 [employer] Patient Information 2011 [employer]. 

## 2010-07-24 NOTE — Assessment & Plan Note (Signed)
I see no signs of blood loss and will monitor his CBC today

## 2010-07-24 NOTE — Assessment & Plan Note (Signed)
He is doing well on Livalo, this is good news since he needs risk reduction with his DM, he has not tolerated other statins in the past. Today I will check a CK level to look for myopathy.

## 2010-07-24 NOTE — Progress Notes (Signed)
Subjective:    Patient ID: [name], male    DOB: January 09, 1951, 60 y.o.   MRN: [id number]  Diabetes He presents for his follow-up diabetic visit. He has type 2 diabetes mellitus. His disease course has been improving. There are no hypoglycemic associated symptoms. Pertinent negatives for hypoglycemia include no headaches or sweats. Pertinent negatives for diabetes include no blurred vision, no chest pain, no fatigue, no foot paresthesias, no foot ulcerations, no polydipsia, no polyphagia, no polyuria, no visual change, no weakness and no weight loss. There are no hypoglycemic complications. Symptoms are stable. There are no diabetic complications. Current diabetic treatment includes oral agent (dual therapy). He is compliant with treatment all of the time. His weight is increasing steadily. He is following a generally healthy diet. Meal planning includes avoidance of concentrated sweets. He has not had a previous visit with a dietician. He participates in exercise intermittently. His home blood glucose trend is decreasing steadily. His breakfast blood glucose range is generally 90-110 mg/dl. His lunch blood glucose range is generally 110-130 mg/dl. His dinner blood glucose range is generally 110-130 mg/dl. His highest blood glucose is 110-130 mg/dl. His overall blood glucose range is 110-130 mg/dl. An ACE inhibitor/angiotensin II receptor blocker is being taken. He does not see a podiatrist.Eye exam is not current.  Hypertension This is a chronic problem. The current episode started more than 1 year ago. The problem has been gradually improving since onset. The problem is controlled. Pertinent negatives include no anxiety, blurred vision, chest pain, headaches, malaise/fatigue, neck pain, orthopnea, palpitations, peripheral edema, PND, shortness of breath or sweats. There are no associated agents to hypertension. Past treatments include angiotensin blockers and diuretics. The current treatment provides  moderate improvement. There are no compliance problems.       Review of Systems  Constitutional: Negative for weight loss, malaise/fatigue and fatigue.  HENT: Negative for neck pain.   Eyes: Negative for blurred vision.  Respiratory: Negative for shortness of breath.   Cardiovascular: Negative for chest pain, palpitations, orthopnea and PND.  Genitourinary: Negative for polyuria.  Neurological: Negative for weakness and headaches.  Hematological: Negative for polydipsia and polyphagia.       Objective:   Physical Exam  Constitutional: He is oriented to person, place, and time. He appears well-developed and well-nourished. No distress.  HENT:  Head: Normocephalic and atraumatic.  Right Ear: External ear normal.  Left Ear: External ear normal.  Nose: Nose normal.  Mouth/Throat: Oropharynx is clear and moist. No oropharyngeal exudate.  Eyes: Conjunctivae and EOM are normal. Pupils are equal, round, and reactive to light. Right eye exhibits no discharge. Left eye exhibits no discharge. No scleral icterus.  Neck: Normal range of motion. Neck supple. No JVD present. No tracheal deviation present. No thyromegaly present.  Cardiovascular: Normal rate, regular rhythm, normal heart sounds and intact distal pulses.  Exam reveals no gallop and no friction rub.   No murmur heard. Pulmonary/Chest: Effort normal and breath sounds normal. No respiratory distress. He has no wheezes. He has no rales. He exhibits no tenderness.  Abdominal: Soft. Bowel sounds are normal. He exhibits no distension and no mass. There is no tenderness. There is no rebound and no guarding.  Musculoskeletal: Normal range of motion. He exhibits no edema and no tenderness.  Lymphadenopathy:    He has no cervical adenopathy.  Neurological: He is alert and oriented to person, place, and time. He has normal reflexes. No cranial nerve deficit. Coordination normal.  Skin: Skin is warm  and dry. No rash noted. He is not  diaphoretic. No erythema. No pallor.  Psychiatric: He has a normal mood and affect. His behavior is normal. Judgment and thought content normal.       Lab Results  Component Value Date   WBC 6.9 04/24/2010   HGB 15.3 04/24/2010   HCT 45.4 04/24/2010   PLT 216.0 04/24/2010   CHOL 208* 04/24/2010   TRIG 68.0 04/24/2010   HDL 41.90 04/24/2010   LDLDIRECT 163.1 04/24/2010   ALT 25 04/24/2010   AST 20 04/24/2010   NA 141 04/24/2010   K 4.2 04/24/2010   CL 105 04/24/2010   CREATININE 0.9 04/24/2010   BUN 14 04/24/2010   CO2 28 04/24/2010   TSH 1.15 04/24/2010   PSA 1.51 10/23/2009   HGBA1C 7.0* 04/24/2010   MICROALBUR 0.6 08/23/2008      Assessment & Plan:

## 2010-07-24 NOTE — Assessment & Plan Note (Signed)
Will check his A1C today and monitor his renal function since he is on metformin

## 2010-08-03 ENCOUNTER — Telehealth: Payer: Self-pay

## 2010-08-03 NOTE — Telephone Encounter (Signed)
Received fax from pharmacy advising that insurance requires PA in order to cover more than 50 strips monthy.

## 2010-08-03 NOTE — Telephone Encounter (Signed)
Must call [phone] (ID# [id number])

## 2010-08-10 ENCOUNTER — Telehealth: Payer: Self-pay | Admitting: *Deleted

## 2010-08-10 NOTE — Telephone Encounter (Signed)
Left mess to call office back.   

## 2010-08-10 NOTE — Telephone Encounter (Signed)
Patient requesting results of recent labs - I see LDL letter only, please advise regarding all other labs.

## 2010-08-10 NOTE — Telephone Encounter (Signed)
Cholesterol level is too high but the other labs look great

## 2010-08-11 NOTE — Telephone Encounter (Signed)
Patient informed, mailed copy of results

## 2010-08-21 NOTE — Discharge Summary (Signed)
[name], [name] NO.:  [id number]   MEDICAL RECORD NO.:  [id number]          PATIENT TYPE:  INP   LOCATION:  3011                         FACILITY:  [facility]   PHYSICIAN:  Reather Littler, M.D.       DATE OF BIRTH:  25-Apr-1950   DATE OF ADMISSION:  03/21/2005  DATE OF DISCHARGE:  03/23/2005                                 DISCHARGE SUMMARY   HISTORY:  This 60 year old African-American male was admitted with diarrhea  for a week, which was progressive and unrelieved by OTC medications.  He is  also having significant weakness.   CURRENT MEDICATIONS, PAST HISTORY, REVIEW OF SYSTEMS:  See HPI.   PHYSICAL EXAMINATION:  His blood pressure was 100/73.  Heart rate 100,  temperature normal.  His tongue was dry.  The rest of the exam was  unremarkable.   HOSPITAL COURSE:  Patient was admitted because of his uncontrolled diarrhea  and dehydration with a creatinine of 2.2 on admission.  The patient was  given IV fluids and Lomotil p.r.n.  Stool studies were negative at the time  of discharge.  His diarrhea resolved during the hospital course and his IV  fluids were discontinued on March 22, 2005 evening.  He was eating well  and feeling improved.  His potassium was supplemented and had him come back  to normal.  His creatinine at the time of discharge was also improved  gradually down to 1.2.  Only other abnormality was low albumin of 3.2.  White blood cell count was normal.   DISCHARGE DIAGNOSES:  1.  Viral diarrhea with dehydration.  2.  Mild acute renal failure.   DISCHARGE CONDITION:  Improved.   DISCHARGE INSTRUCTIONS:  Start with a soft diet and gradually increase, to  hold off on his Glucophage, Dilantin, and Diltiazem until Saturday and  follow-up in the office as scheduled.           ______________________________  Reather Littler, M.D.     AK/MEDQ  D:  03/23/2005  T:  03/24/2005  Job:  [id number]

## 2010-08-21 NOTE — H&P (Signed)
[name], [name] NO.:  [id number]   MEDICAL RECORD NO.:  [id number]          PATIENT TYPE:  EMS   LOCATION:  MAJO                         FACILITY:  [facility]   PHYSICIAN:  Veverly Fells. Altheimer, M.D.DATE OF BIRTH:  April 03, 1951   DATE OF ADMISSION:  03/21/2005  DATE OF DISCHARGE:                                HISTORY & PHYSICAL   REASON FOR ADMISSION:  Diarrhea and dehydration.   HISTORY:  This is a 60 year old man with onset of mild diarrhea 7 days ago  which progressed 3 days ago to extreme diarrhea with at least 10 watery  stools per day, unrelieved with Imodium 2 per day and Kaopectate.  He has  not had any nausea or vomiting.  His appetite has been somewhat decreased.  He is progressively weak over the past couple of days and also has dry  mouth.  He has not had any significant abdominal pain or cramping.  He has  not noticed any hematochezia or melena.  He had some chills about 3 days ago  when the bad diarrhea started but no known fever.  He has not had any  unusual food or water exposures or travel.  He has not had any prior similar  illnesses.  He does not have any chronic GI symptoms except occasional  reflux relieved with p.r.n. Nexium.  He started metformin ER February 22, 2005, and noted slightly looser stools for the first 3 weeks, as the dose  was gradually increased up to 1500 mg per day, but nothing like the diarrhea  that he has experienced over the past week.   PAST MEDICAL HISTORY:  1.  Diabetes mellitus, type 2, associated with obesity.  Random blood sugar      was 131 with A1c 6.8% on February 23, 2005.  2.  Dyslipidemia with previous treatments with Lipitor.  3.  Hypertension.  4.  Status post arthroscopy right knee.   ALLERGIES:  No known drug allergies, although there may have been some  question of Lipitor causing myalgias.   MEDICATIONS:  1.  Amaryl 4 mg daily.  2.  Metformin ER (since February 22, 2005) 500 mg 3 per day.  3.   Cardizem CD 240 mg 2 daily.  4.  Diovan/hydrochlorothiazide 320/25 mg daily.  5.  Celexa 20 mg daily since February 22, 2005.  6.  Nexium 40 mg p.r.n.  7.  Lomotil and Kaopectate as above this past week.   FAMILY HISTORY:  Positive for diabetes and hypertension.   SOCIAL HISTORY:  He lives with his wife.  He quit smoking many years ago.  Alcohol intake is stated to be 1 drink of bourbon about twice a week.   REVIEW OF SYSTEMS:  EYES: Negative.  EAR, NOSE, THROAT: Negative.  CARDIOPULMONARY: Denies chest pain, dyspnea, palpitations.  GASTROINTESTINAL: As above.  GENITOURINARY: No complaints.  MUSCULOSKELETAL:  No complaints except some mild back pain.   PHYSICAL EXAMINATION:  GENERAL: Alert, pleasant, 60 year old black male in  no acute distress.  VITAL SIGNS: Blood pressure 100/73, heart rate 100, O2 saturation 94 to 97%,  temperature 97.4.  SKIN:  Negative.  EYES: Normal externally with fundi not examined.  OROPHARYNX:  Notable for dry tongue despite already receiving 1.5 liters of  fluid.  NECK:  Supple without thyromegaly with carotid upstrokes normal without  bruit.  LUNGS:  Unlabored, clear.  HEART:  Regular without murmur.  ABDOMEN:  Soft, nontender, no mass, with normal bowel sounds.  EXTREMITIES:  Pedal pulses intact without edema.  NEUROLOGIC:  Without focal deficit.   LABORATORY DATA:  Sodium 137, potassium 3.4, glucose 84, BUN 16, creatinine  2.2 (versus 0.9 in November).  Hemoglobin 16.6, WBC 9.5.   ASSESSMENT:  1.  Diarrhea for 7 days, probably viral enteritis versus food poisoning.  2.  Dehydration secondary to #1 with creatinine 2.2 which is surprisingly      out of proportion to the BUN of 16.  3.  Hypokalemia.  4.  Diabetes mellitus, type 2.  5.  Hypertension.   PLAN:  1.  Will continue saline at 250 and hour until he has received a total of 2      liters, then change to D5 half normal saline with 10 mEq of potassium at      150 per hour. He has already  received at least 10 mEq of potassium IV      and 40 mEq p.o.  2.  We will let him eat a carbohydrate-controlled, medium-calorie, solid, no-      dairy-product diet as tolerated.  3.  Will monitor CBGs and continue his Amaryl but hold the metformin.  4.  Will empirically treated diarrhea with Lomotil.  He has already received      an initial dose of Cipro in the emergency room, and consideration will      be given to continuing that depending on his initial course.  5.  We will recheck labs in the morning.  6.  GI consult if symptoms not resolving.           ______________________________  Veverly Fells. Altheimer, M.D.     MDA/MEDQ  D:  03/21/2005  T:  03/22/2005  Job:  [id number]   cc:   Reather Littler, M.D.  Fax: [phone]

## 2010-08-21 NOTE — Op Note (Signed)
[facility] [place]  Patient:    CRANDALL, HARVEL                       MRN: [id number] Proc. Date: 06/24/00 Adm. Date:  [id number] Attending:  Abigail Miyamoto A                           Operative Report  PREOPERATIVE DIAGNOSIS:  Pilonidal cyst.  POSTOPERATIVE DIAGNOSIS:  Pilonidal cyst.  OPERATION:  Excision of pilonidal cyst.  SURGEON:  Abigail Miyamoto, M.D.  ANESTHESIA:  General endotracheal anesthesia, 0.25% Marcaine with epinephrine.  ESTIMATED BLOOD LOSS:  Minimal.  DESCRIPTION OF PROCEDURE:  The patient is brought to the operating room, identified as Kerrie Buffalo.  He was electively intubated and then placed in a prone position on the operating room table.  His gluteal cleft area was then prepped and draped in the usual sterile fashion.  Using a #15 blade, an elliptical incision was made at the superior aspect of the gluteal cleft around all the chronic pilonidal tissue.  The incision was carried down through the dermis and fat with the electrocautery down to the sacrum.  The large wedge of tissue to the sacrum was then completely excised with the cautery.  Hemostasis was then achieved with the cautery.  The wound was then copiously irrigated with normal saline.  The subcutaneous layer was then closed with interrupted 2-0 Vicryl sutures.  Skin was then closed with interrupted 2-0 and 3-0 nylon sutures.  Gauze was then applied.  The patient tolerated the procedure well.  All sponge, needle, and instrument counts were correct at the end of the procedure.  The patient was then extubated in the operating room and taken in stable condition to the recovery room. DD:  06/24/00 TD:  06/25/00 Job: [id number]

## 2010-08-21 NOTE — Op Note (Signed)
NAMEEWIN, REHBERG                          ACCOUNT NO.:  [id number]   MEDICAL RECORD NO.:  [id number]                   PATIENT TYPE:  AMB   LOCATION:  ENDO                                 FACILITY:  [facility]   PHYSICIAN:  Charolett Bumpers, M.D.             DATE OF BIRTH:  08/06/1950   DATE OF PROCEDURE:  11/09/2001  DATE OF DISCHARGE:  11/09/2001                                 OPERATIVE REPORT   PROCEDURES:  1. Esophagogastroduodenoscopy.  2. Colonoscopy.   INDICATIONS:  The patient is a 60 year old male born 2051-02-20.  The patient  has gastroesophageal reflux manifested by heartburn and requests an  esophagogastroduodenoscopy.  He also has a history of lower gastrointestinal  bleeding, and a colonoscopy with polypectomy to prevent colon cancer is  scheduled.   ENDOSCOPIST:  Charolett Bumpers, M.D.   PREMEDICATION:  Fentanyl 50 mcg, Versed 10 mg.   ENDOSCOPE:  The Olympus gastroscope and pediatric colonoscope.   PROCEDURE:  Esophagogastroduodenoscopy.   DESCRIPTION OF PROCEDURE:  After obtaining informed consent, the patient was  placed in the left lateral decubitus position.  I administered intravenous  Versed and intravenous fentanyl to achieve conscious sedation for the  procedure.  The patient's blood pressure, oxygen saturation, and cardiac  rhythm were monitored throughout the procedure and documented in the medical  record.   The Olympus gastroscope was passed through the posterior hypopharynx into  the proximal esophagus without difficulty.  The hypopharynx, larynx, and  vocal cords appeared normal.   Esophagoscopy:  The proximal, mid-, and lower segments of the esophagus  appeared normal.  There was no endoscopic evidence for the presence of  erosive esophagitis, Barrett's esophagus, or esophageal mucosal scarring.   Gastroscopy:  A retroflexed view of the gastric cardia and fundus was  normal.  The diaphragmatic hiatus is minimally patulous.  The gastric  body,  antrum, and pylorus appear normal.   Duodenoscopy:  The duodenal bulb and descending duodenum appeared normal.   ASSESSMENT:  Normal esophagogastroduodenoscopy.   PROCEDURE:  Proctocolonoscopy to the cecum.   DESCRIPTION OF PROCEDURE:  Anal inspection was normal.  Digital rectal exam  revealed a non-nodular prostate.  The Olympus pediatric video colonoscope  was introduced into the rectum and easily advanced to the cecum.  Colonic  preparation for the exam today was excellent.   Rectum normal.   Sigmoid colon and descending colon normal.   Splenic flexure normal.   Transverse colon normal.   Hepatic flexure normal.   Ascending colon normal.   Cecum and ileocecal valve normal.   ASSESSMENT:  Normal proctocolonoscopy to the cecum.  No endoscopic evidence  for the presence of colorectal neoplasia.  Charolett Bumpers, M.D.    MKJ/MEDQ  D:  11/09/2001  T:  11/13/2001  Job:  [id number]   cc:   Desma Maxim, M.D.

## 2010-09-22 ENCOUNTER — Ambulatory Visit (INDEPENDENT_AMBULATORY_CARE_PROVIDER_SITE_OTHER): Payer: 59 | Admitting: Internal Medicine

## 2010-09-22 ENCOUNTER — Encounter: Payer: Self-pay | Admitting: Internal Medicine

## 2010-09-22 VITALS — BP 154/90 | HR 66 | Temp 97.0°F | Resp 16 | Wt 247.0 lb

## 2010-09-22 DIAGNOSIS — T6391XA Toxic effect of contact with unspecified venomous animal, accidental (unintentional), initial encounter: Secondary | ICD-10-CM

## 2010-09-22 DIAGNOSIS — T63461A Toxic effect of venom of wasps, accidental (unintentional), initial encounter: Secondary | ICD-10-CM

## 2010-09-22 DIAGNOSIS — T63441A Toxic effect of venom of bees, accidental (unintentional), initial encounter: Secondary | ICD-10-CM

## 2010-09-22 DIAGNOSIS — L0291 Cutaneous abscess, unspecified: Secondary | ICD-10-CM

## 2010-09-22 DIAGNOSIS — T148 Other injury of unspecified body region: Secondary | ICD-10-CM

## 2010-09-22 DIAGNOSIS — T148XXA Other injury of unspecified body region, initial encounter: Secondary | ICD-10-CM

## 2010-09-22 DIAGNOSIS — W57XXXA Bitten or stung by nonvenomous insect and other nonvenomous arthropods, initial encounter: Secondary | ICD-10-CM

## 2010-09-22 DIAGNOSIS — L0390 Cellulitis, unspecified: Secondary | ICD-10-CM

## 2010-09-22 MED ORDER — SULFAMETHOXAZOLE-TRIMETHOPRIM 800-160 MG PO TABS: 1.0000 | ORAL_TABLET | Freq: Two times a day (BID) | ORAL | Status: AC

## 2010-09-22 MED ORDER — CLOBETASOL PROPIONATE 0.05 % EX OINT: TOPICAL_OINTMENT | Freq: Two times a day (BID) | CUTANEOUS | Status: DC

## 2010-09-22 NOTE — Assessment & Plan Note (Signed)
Start bactrim ds in the event that this is early MRSA

## 2010-09-22 NOTE — Patient Instructions (Signed)
Cellulitis Cellulitis is an infection of the skin and the tissue beneath it. The area is typically red and tender. It is caused by germs (bacteria) (usually staph or strep) that enter the body through cuts or sores. Cellulitis most commonly occurs in the arms or lower legs.  HOME CARE INSTRUCTIONS  If you are given a prescription for medications which kill germs (antibiotics), take as directed until finished.   If the infection is on the arm or leg, keep the limb elevated as able.   Use a warm cloth several times per day to relieve pain and encourage healing.   See your caregiver for recheck of the infected site in 100.5 or sooner if problems arise.   Only take over-the-counter or prescription medicines for pain, discomfort, or fever as directed by your caregiver.  SEEK MEDICAL CARE IF: An oral temperature above 100.5Allergic Reaction, Localized, Insect An insect sting can cause pain, redness and itching at the sting site. Symptoms of a local reaction are usually contained to the area of the sting site. A localized allergic reaction usually occurs within minutes of an insect sting.  SYMPTOMS  A local reaction at the sting site can cause:  Pain.  Redness.  Itching.  Swelling.  A systematic reaction can cause a reaction anywhere on your body. For example, you may develop the following:  Hives.  Generalized swelling.  Body aches.  Itching.  Dizziness.  Nausea or vomiting.  A more serious (anaphylactic) reaction can involve:  Difficulty breathing or wheezing.  Tongue or throat swelling.  Fainting.  HOME CARE INSTRUCTIONS If you are stung, look to see if the stinger is still in the skin. This can appear as a small black dot at the sting site. The stinger can be removed by scraping it with a dull object such as a credit card or your fingernail. Do not use tweezers. Tweezers can squeeze the stinger and release more insect venom into the skin.  After the stinger has been removed, wash the  sting site with soap and water or rubbing alcohol.  Apply ice to the area of the sting for 20 minutes. Place ice in a bag and put a towel between the bag and your skin. Do this 3 times per day. Applying ice can help relieve pain to the sting site.  Redness and swelling of the sting site may last as long as a week  A topical anti-itch cream like hydrocortisone cream can help reduce skin itching.  An oral anti-histamine medication can help decrease swelling or other related symptoms.  Only take (or give your child) over -the-counter or prescription medicines for pain, discomfort, or fever as told by your caregiver.  FOLLOW-UP CARE If prescribed, carry an injectable epinephrine (EpiPen) with you at all times! It is important to know how and when to use the EpiPen.  Avoid contact with stinging insects or the insect thought to have caused this reaction.  Wear long pants when mowing grass or hiking. Wear gloves when gardening.  Use unscented deodorant and avoid strong perfumes when outdoors.  Wear a medical identification bracelet or necklace that describes your allergies or medical conditions.  Make sure your primary caregiver has a record of your insect sting reaction.  It may be helpful consult with an allergist. You may have other sensitivities that you are not aware of. To locate an allergist near you, contact:  The American Academy of Allergy, Asthma & Immunology [www.aaaai.org / (800) (681)185-1981] or  The [employer] of  All[employer], Asthma & Immunology [www.acaai.org / (800) 928-390-1394].  seek immediate medical care IF:  The following may be early warning signs of a serious generalized or anaphylactic reaction. Call your local emergency service immediately!  You experience wheezing and/or difficulty breathing.  You have difficulty swallowing, or throat tightness.  You have mouth, tongue or throat swelling.  You feel weak, faint or pass out.  You have coughing or a change in your voice.  You  experience vomiting, diarrhea, or stomach cramps.  You have chest pain or lightheadedness.  You notice raised red patches on the skin that itch.  MAKE SURE YOU:  Understand these instructions.  Will watch your condition.  Will get help right away if you are not doing well or get worse.  Document Released: 02/18/2006 Document Re-Released: 04/13/2009  [facility] Patient Information 2011 [place], [place]. develops, not controlled by medication.   The area of redness (inflammation) is spreading, there are red streaks coming from the infected site, or if a part of the infection begins to turn dark in color.   The joint or bone underneath the infected skin becomes painful after the skin has healed.   The infection returns in the same or another area after it seems to have gone away.   A boil or bump swells up. This may be an abscess.   New, unexplained problems such as pain or fever develop.  SEEK IMMEDIATE MEDICAL CARE IF:  You or your child feels drowsy or lethargic.   There is vomiting, diarrhea, or lasting discomfort or feeling ill (malaise) with muscle aches and pains.  MAKE SURE YOU:   Understand these instructions.   Will watch your condition.   Will get help right away if you are not doing well or get worse.  Document Released: 12/30/2004 Document Re-Released: 01/17/2009 [facility] Patient Information 2011 [place], [place].

## 2010-09-22 NOTE — Assessment & Plan Note (Signed)
He was given depo-medrol in the office and will use clobetasol topically

## 2010-09-22 NOTE — Progress Notes (Signed)
  Subjective:    Patient ID: [name], male    DOB: [birth year], 60 y.o.   MRN: [id number]  HPI He returns for f/up and he tells me that he disturbed some insects while cutting the grass about 3 days ago and that he got bitten on the left arm and right lower leg. He complains that the bites on his left arm are itching and that the lesion on his right lower leg is draining yellow fluid.    Review of Systems  Constitutional: Negative for fever, chills, diaphoresis, activity change, appetite change, fatigue and unexpected weight change.  HENT: Negative.   Eyes: Negative.   Respiratory: Negative.   Cardiovascular: Negative.   Genitourinary: Negative.   Musculoskeletal: Negative.   Skin: Positive for wound (bites). Negative for color change, pallor and rash.  Neurological: Negative.   Hematological: Negative.   Psychiatric/Behavioral: Negative.        Objective:   Physical Exam  Vitals reviewed. Constitutional: He is oriented to person, place, and time. He appears well-developed and well-nourished. No distress.  HENT:  Head: Normocephalic and atraumatic.  Right Ear: External ear normal.  Left Ear: External ear normal.  Nose: Nose normal.  Mouth/Throat: Oropharynx is clear and moist. No oropharyngeal exudate.  Eyes: Conjunctivae and EOM are normal. Pupils are equal, round, and reactive to light. Right eye exhibits no discharge. Left eye exhibits no discharge. No scleral icterus.  Neck: Normal range of motion. Neck supple. No JVD present. No tracheal deviation present. No thyromegaly present.  Cardiovascular: Normal rate, regular rhythm, normal heart sounds and intact distal pulses.  Exam reveals no gallop and no friction rub.   No murmur heard. Pulmonary/Chest: Effort normal and breath sounds normal. No stridor. No respiratory distress. He has no wheezes. He has no rales. He exhibits no tenderness.  Abdominal: Soft. Bowel sounds are normal. He exhibits no distension and no mass.  There is no tenderness. There is no rebound and no guarding.  Musculoskeletal: Normal range of motion. He exhibits no edema and no tenderness.  Lymphadenopathy:    He has no cervical adenopathy.  Neurological: He is alert and oriented to person, place, and time. He has normal reflexes. No cranial nerve deficit.  Skin: Skin is warm, dry and intact. Lesion and rash noted. No abrasion, no bruising, no burn, no ecchymosis, no laceration, no petechiae and no purpura noted. Rash is papular. Rash is not macular, not maculopapular, not nodular, not pustular, not vesicular and not urticarial. He is not diaphoretic. No erythema. No pallor.          On his right lower anterior leg there is a disrupted bullous lesion that drains yellow serous fluid but there is no pus, induration, fluctuance, streaking, or warmth.  On the left volar forearm there are 3 excoriated papules with faint erythema but no pus, vesicles, exudates, induration, or streaking.  Psychiatric: He has a normal mood and affect. His behavior is normal. Judgment and thought content normal.          Assessment & Plan:

## 2010-10-23 ENCOUNTER — Other Ambulatory Visit (INDEPENDENT_AMBULATORY_CARE_PROVIDER_SITE_OTHER): Payer: 59

## 2010-10-23 ENCOUNTER — Ambulatory Visit (INDEPENDENT_AMBULATORY_CARE_PROVIDER_SITE_OTHER): Payer: 59 | Admitting: Internal Medicine

## 2010-10-23 ENCOUNTER — Encounter: Payer: Self-pay | Admitting: Internal Medicine

## 2010-10-23 VITALS — BP 130/84 | HR 61 | Temp 97.9°F | Resp 16 | Wt 248.0 lb

## 2010-10-23 DIAGNOSIS — F0390 Unspecified dementia without behavioral disturbance: Secondary | ICD-10-CM

## 2010-10-23 DIAGNOSIS — I10 Essential (primary) hypertension: Secondary | ICD-10-CM

## 2010-10-23 DIAGNOSIS — E785 Hyperlipidemia, unspecified: Secondary | ICD-10-CM

## 2010-10-23 DIAGNOSIS — E119 Type 2 diabetes mellitus without complications: Secondary | ICD-10-CM

## 2010-10-23 DIAGNOSIS — F329 Major depressive disorder, single episode, unspecified: Secondary | ICD-10-CM

## 2010-10-23 LAB — CBC WITH DIFFERENTIAL/PLATELET
Basophils Relative: 0.7 % (ref 0.0–3.0)
Eosinophils Absolute: 0.1 10*3/uL (ref 0.0–0.7)
Eosinophils Relative: 1.4 % (ref 0.0–5.0)
Lymphocytes Relative: 28.1 % (ref 12.0–46.0)
MCHC: 33.4 g/dL (ref 30.0–36.0)
Neutrophils Relative %: 61.7 % (ref 43.0–77.0)
RBC: 4.83 Mil/uL (ref 4.22–5.81)
WBC: 7.8 10*3/uL (ref 4.5–10.5)

## 2010-10-23 LAB — URINALYSIS, ROUTINE W REFLEX MICROSCOPIC
Nitrite: NEGATIVE
Specific Gravity, Urine: 1.025 (ref 1.000–1.030)
Total Protein, Urine: NEGATIVE
Urine Glucose: NEGATIVE
pH: 6 (ref 5.0–8.0)

## 2010-10-23 LAB — HEMOGLOBIN A1C: Hgb A1c MFr Bld: 7 % — ABNORMAL HIGH (ref 4.6–6.5)

## 2010-10-23 LAB — FOLATE: Folate: >24.8 ng/mL (ref >5.9)

## 2010-10-23 LAB — COMPREHENSIVE METABOLIC PANEL
ALT: 35 U/L (ref 0–53)
Albumin: 4.2 g/dL (ref 3.5–5.2)
Alkaline Phosphatase: 68 U/L (ref 39–117)
Glucose, Bld: 85 mg/dL (ref 70–99)
Potassium: 3.6 mEq/L (ref 3.5–5.1)
Sodium: 141 mEq/L (ref 135–145)
Total Protein: 7.8 g/dL (ref 6.0–8.3)

## 2010-10-23 LAB — TSH: TSH: 0.89 u[IU]/mL (ref 0.35–5.50)

## 2010-10-23 NOTE — Progress Notes (Signed)
Subjective:    Patient ID: [name], male    DOB: [birth year], 60 y.o.   MRN: [id number]  Hyperlipidemia This is a chronic problem. The current episode started more than 1 year ago. The problem is controlled. Recent lipid tests were reviewed and are variable. Exacerbating diseases include chronic renal disease, diabetes and obesity. He has no history of hypothyroidism, liver disease or nephrotic syndrome. Factors aggravating his hyperlipidemia include thiazides. Pertinent negatives include no chest pain, focal sensory loss, focal weakness, leg pain, myalgias or shortness of breath. Current antihyperlipidemic treatment includes statins. The current treatment provides mild improvement of lipids. Compliance problems include adherence to exercise, adherence to diet and psychosocial issues.   Diabetes He presents for his follow-up diabetic visit. He has type 2 diabetes mellitus. His disease course has been fluctuating. Hypoglycemia symptoms include confusion. Pertinent negatives for hypoglycemia include no dizziness, headaches, hunger, mood changes, nervousness/anxiousness, pallor, seizures, sleepiness, speech difficulty, sweats or tremors. Pertinent negatives for diabetes include no blurred vision, no chest pain, no fatigue, no foot paresthesias, no foot ulcerations, no polydipsia, no polyphagia, no polyuria, no visual change, no weakness and no weight loss. There are no hypoglycemic complications. Symptoms are stable. There are no diabetic complications. Current diabetic treatment includes oral agent (dual therapy). He is compliant with treatment most of the time. His weight is increasing steadily. He is following a generally healthy diet. Meal planning includes avoidance of concentrated sweets. He has not had a previous visit with a dietician. He never participates in exercise. There is no change in his home blood glucose trend. An ACE inhibitor/angiotensin II receptor blocker is being taken. He does not  see a podiatrist.Eye exam is not current.   He returns at the request of his psychiatrist, Dr. Jennelle Human, who has requested labs be done for the evaluation of possible dementia, the pt tells me that he is having trouble with his memory and he tells me that he has been on abilify for one month and that his memory has worsened since he started abilify.   Review of Systems  Constitutional: Negative for fever, chills, weight loss, diaphoresis, activity change, appetite change, fatigue and unexpected weight change.  HENT: Negative for facial swelling, trouble swallowing, neck pain, neck stiffness and voice change.   Eyes: Negative for blurred vision, photophobia, redness and visual disturbance.  Respiratory: Negative for apnea, cough, choking, chest tightness, shortness of breath, wheezing and stridor.   Cardiovascular: Negative for chest pain, palpitations and leg swelling.  Gastrointestinal: Negative for nausea, vomiting, abdominal pain, diarrhea, constipation, abdominal distention and anal bleeding.  Genitourinary: Negative for dysuria, urgency, polyuria, frequency, hematuria, flank pain, decreased urine volume, enuresis and difficulty urinating.  Musculoskeletal: Negative for myalgias, back pain, joint swelling, arthralgias and gait problem.  Skin: Negative for color change, pallor, rash and wound.  Neurological: Negative for dizziness, tremors, focal weakness, seizures, syncope, facial asymmetry, speech difficulty, weakness, light-headedness, numbness and headaches.  Hematological: Negative for polydipsia and polyphagia.  Psychiatric/Behavioral: Positive for confusion and decreased concentration. Negative for suicidal ideas, hallucinations, behavioral problems, sleep disturbance, self-injury, dysphoric mood and agitation. The patient is not nervous/anxious and is not hyperactive.        Objective:   Physical Exam  Vitals reviewed. Constitutional: He is oriented to person, place, and time. He  appears well-developed and well-nourished. No distress.  HENT:  Head: Normocephalic and atraumatic.  Right Ear: External ear normal.  Left Ear: External ear normal.  Nose: Nose normal.  Mouth/Throat: Oropharynx is clear  and moist. No oropharyngeal exudate.  Eyes: Conjunctivae and EOM are normal. Pupils are equal, round, and reactive to light. Right eye exhibits no discharge. Left eye exhibits no discharge. No scleral icterus.  Neck: Normal range of motion. Neck supple. No JVD present. No tracheal deviation present. No thyromegaly present.  Cardiovascular: Normal rate, regular rhythm, normal heart sounds and intact distal pulses.  Exam reveals no gallop and no friction rub.   No murmur heard. Pulmonary/Chest: Effort normal and breath sounds normal. No stridor. No respiratory distress. He has no wheezes. He has no rales. He exhibits no tenderness.  Abdominal: Soft. Bowel sounds are normal. He exhibits no distension and no mass. There is no tenderness. There is no rebound and no guarding.  Musculoskeletal: Normal range of motion. He exhibits no edema and no tenderness.  Lymphadenopathy:    He has no cervical adenopathy.  Neurological: He is alert and oriented to person, place, and time. He has normal reflexes. He displays normal reflexes. No cranial nerve deficit. He exhibits normal muscle tone. Coordination normal.  Skin: Skin is warm and dry. No rash noted. He is not diaphoretic. No erythema. No pallor.  Psychiatric: He has a normal mood and affect. His behavior is normal. Judgment and thought content normal.        Lab Results  Component Value Date   WBC 6.1 07/24/2010   HGB 14.7 07/24/2010   HCT 44.3 07/24/2010   PLT 205.0 07/24/2010   CHOL 215* 07/24/2010   TRIG 51.0 07/24/2010   HDL 43.30 07/24/2010   LDLDIRECT 161.7 07/24/2010   ALT 30 07/24/2010   AST 26 07/24/2010   NA 140 07/24/2010   K 4.5 07/24/2010   CL 101 07/24/2010   CREATININE 0.9 07/24/2010   BUN 14 07/24/2010   CO2 31  07/24/2010   TSH 0.64 07/24/2010   PSA 1.51 10/23/2009   HGBA1C 7.2* 07/24/2010   MICROALBUR 0.6 08/23/2008    Assessment & Plan:

## 2010-10-23 NOTE — Assessment & Plan Note (Signed)
I will check his A1C today and will monitor his renal function 

## 2010-10-23 NOTE — Assessment & Plan Note (Signed)
Labs ordered as requested by Dr. Jennelle Human, I reviewed his CT of brain from last fall - normal, I think he should see a neurologist.

## 2010-10-23 NOTE — Assessment & Plan Note (Signed)
He is doing well in livalo

## 2010-10-23 NOTE — Assessment & Plan Note (Signed)
His BP is well controlled, I will check his lytes and renal function today 

## 2010-10-23 NOTE — Patient Instructions (Signed)
Dementia Dementia is a general term for problems with brain function. A person with dementia has memory loss and a hard time with at least one other brain function such as thinking, speaking, language skills or solving problems. It can affect social functioning, how you do your job, mood or personality. The changes may be hidden for a long time. The earliest forms of this disease are usually not detected by family or friends. CAUSES Dementia can be caused by:  Many small strokes (vascular dementia).   Medications.   Masses or pressure in the brain (tumor, blood clot, normal pressure hydrocephalus).   Infection (chronic meningitis or Creutzfelt-Jakob disease).   Depression. This can contribute to the symptoms of dementia.   Degeneration of brain cells (Alzheimer's disease or Lewy Body Disease).   Metabolic causes (excessive alcohol intake, vitamin B12 deficiency or thyroid disease).  SYMPTOMS Symptoms are often hard to detect. Families or co-workers may not notice them early in the disease process. Different people with dementia may have different symptoms.  Symptoms can include:  A hard time with memory, especially recent memory.   You may ask the same question multiple times or forget something someone just told you.   Long-term memory may not be impaired.   A hard time speaking your thoughts or finding certain words.   A hard time solving problems or performing familiar tasks (such as how to use a telephone).   Sudden changes in mood.   Changes in personality, especially increasing moodiness or mistrust.   A hard time understanding complex ideas that were never a problem in the past.  DIAGNOSIS There are no specific tests for dementia.   Your caregiver may recommend a thorough evaluation. This is because some forms of dementia can be reversible. The evaluation will likely include a physical exam and getting a detailed history from you and a family member. The history often  gives the best clues and suggestions for a diagnosis.   Memory testing may be done. A detailed brain function evaluation called neuropsychologic testing may be helpful.   Lab tests and brain imaging (such as CT scan or MRI scan) are sometimes important.   Sometimes observation and reevaluation over time is very helpful.  TREATMENT Treatment depends on the cause.   If the problem is a vitamin deficiency, it may be helped or cured with that supplement.   For dementias such as Alzheimer's disease (degenerative dementia), medicines are available to stabilize or slow the course of the disease. There are no cures for this type.   Your caregiver can help direct you to groups, organizations and other caregivers to help with decisions in the care of you or your loved one.   Dementia research is very active. Several medications are being looked at and tested in clinical trials. There is always a measure of hope that help will be coming.  SEEK MEDICAL CARE IF:  New behavioral problems start such as moodiness, aggressiveness or seeing things that are not there (hallucinations) develop.   Any new problem with brain function happens. This includes problems with balance, speech or falling a lot.   Problems with swallowing develop.   Any symptoms of other illness happen.  Small changes or worsening in any aspect of brain function can be a sign that the illness is getting worse. It can also be a sign of another medical illness such as infection. Seeing a caregiver right away is important. SEEK IMMEDIATE MEDICAL CARE IF:  A temperature over 100.5 develops  or as directed by your caregiver.   New or worsened confusion develops.   New or worsened sleepiness or having a hard time staying awake develops.  Document Released: 09/15/2000 Document Re-Released: 09/08/2007 [facility] Patient Information 2011 [place], [place].Diabetes, Type 2 Diabetes is a lasting (chronic) disease. In type 2 diabetes, the  pancreas does not make enough insulin (a hormone), and the body does not respond normally to the insulin that is made. This type of diabetes was also previously called adult onset diabetes. About 90% of all those who have diabetes have type 2. It usually occurs after the age of 70 but can occur at any age. CAUSES Unlike type 1 diabetes, which happens because insulin is no longer being made, type 2 diabetes happens because the body is making less insulin and has trouble using the insulin properly. SYMPTOMS  Drinking more than usual.   Urinating more than usual.   Blurred vision.   Dry, itchy skin.   Frequent infection like yeast infections in women.   More tired than usual (fatigue).  TREATMENT  Healthy eating.   Exercise.   Medication, if needed.   Monitoring blood glucose (sugar).   Seeing your caregiver regularly.  HOME CARE INSTRUCTIONS  Check your blood glucose (sugar) at least once daily. More frequent monitoring may be necessary, depending on your medications and on how well your diabetes is controlled. Your caregiver will advise you.   Take your medicine as directed by your caregiver.   Do not smoke.   Make wise food choices. Ask your caregiver for information. Weight loss can improve your diabetes.   Learn about low blood glucose (hypoglycemia) and how to treat it.   Get your eyes checked regularly.   Have a yearly physical exam. Have your blood pressure checked. Get your blood and urine tested.   Wear a pendant or bracelet saying that you have diabetes.   Check your feet every night for sores. Let your caregiver know if you have sores that are not healing.  SEEK MEDICAL CARE IF:  You are having problems keeping your blood glucose at target range.   You feel you might be having problems with your medicines.   You have symptoms of an illness that is not improving after 24 hours.   You have a sore or wound that is not healing.   You notice a change in  vision or a new problem with your vision.   You develop a fever of more than 100.5.  Document Released: 03/22/2005 Document Re-Released: 04/13/2009 [facility] Patient Information 2011 [place], [place].

## 2010-10-30 ENCOUNTER — Other Ambulatory Visit: Payer: Self-pay | Admitting: Internal Medicine

## 2011-01-22 ENCOUNTER — Ambulatory Visit (INDEPENDENT_AMBULATORY_CARE_PROVIDER_SITE_OTHER): Payer: 59 | Admitting: Internal Medicine

## 2011-01-22 ENCOUNTER — Encounter: Payer: Self-pay | Admitting: Internal Medicine

## 2011-01-22 DIAGNOSIS — E119 Type 2 diabetes mellitus without complications: Secondary | ICD-10-CM

## 2011-01-22 DIAGNOSIS — I10 Essential (primary) hypertension: Secondary | ICD-10-CM

## 2011-01-22 DIAGNOSIS — E785 Hyperlipidemia, unspecified: Secondary | ICD-10-CM

## 2011-01-22 NOTE — Patient Instructions (Signed)

## 2011-01-22 NOTE — Assessment & Plan Note (Signed)
His BP is well controlled, I will check his lytes and renal function 

## 2011-01-22 NOTE — Assessment & Plan Note (Signed)
He is doing well on livalo 

## 2011-01-22 NOTE — Assessment & Plan Note (Signed)
I will check his A1C and will monitor his renal function 

## 2011-01-22 NOTE — Progress Notes (Signed)
Subjective:    Patient ID: [name], male    DOB: 1950-12-09, 60 y.o.   MRN: [id number]  Diabetes He presents for his follow-up diabetic visit. He has type 2 diabetes mellitus. His disease course has been stable. There are no hypoglycemic associated symptoms. Pertinent negatives for hypoglycemia include no dizziness, headaches, pallor, seizures, speech difficulty or tremors. Pertinent negatives for diabetes include no blurred vision, no chest pain, no fatigue, no foot paresthesias, no foot ulcerations, no polydipsia, no polyphagia, no polyuria, no visual change, no weakness and no weight loss. There are no hypoglycemic complications. Symptoms are stable. There are no diabetic complications. Current diabetic treatment includes oral agent (dual therapy). He is compliant with treatment all of the time. His weight is increasing steadily. He is following a generally healthy diet. Meal planning includes avoidance of concentrated sweets. He has not had a previous visit with a dietician. He never participates in exercise. There is no change in his home blood glucose trend. An ACE inhibitor/angiotensin II receptor blocker is being taken. He does not see a podiatrist.Eye exam is current.      Review of Systems  Constitutional: Negative for fever, chills, weight loss, diaphoresis, activity change, appetite change, fatigue and unexpected weight change.  HENT: Negative.   Eyes: Negative.  Negative for blurred vision.  Respiratory: Negative for apnea, cough, choking, chest tightness, shortness of breath, wheezing and stridor.   Cardiovascular: Negative for chest pain, palpitations and leg swelling.  Gastrointestinal: Negative for nausea, abdominal pain, diarrhea, constipation, blood in stool, abdominal distention, anal bleeding and rectal pain.  Genitourinary: Negative for dysuria, urgency, polyuria, frequency, hematuria, decreased urine volume, enuresis and difficulty urinating.  Musculoskeletal:  Negative.   Skin: Negative for color change, pallor, rash and wound.  Neurological: Negative for dizziness, tremors, seizures, syncope, facial asymmetry, speech difficulty, weakness, light-headedness, numbness and headaches.  Hematological: Negative for polydipsia, polyphagia and adenopathy. Does not bruise/bleed easily.       Objective:   Physical Exam  Vitals reviewed. Constitutional: He is oriented to person, place, and time. He appears well-developed and well-nourished. No distress.  HENT:  Head: Normocephalic and atraumatic.  Mouth/Throat: Oropharynx is clear and moist. No oropharyngeal exudate.  Eyes: Conjunctivae are normal. Right eye exhibits no discharge. Left eye exhibits no discharge. No scleral icterus.  Neck: Normal range of motion. Neck supple. No JVD present. No tracheal deviation present. No thyromegaly present.  Cardiovascular: Normal rate, regular rhythm, normal heart sounds and intact distal pulses.  Exam reveals no gallop and no friction rub.   No murmur heard. Pulmonary/Chest: Effort normal and breath sounds normal. No stridor. No respiratory distress. He has no wheezes. He has no rales. He exhibits no tenderness.  Abdominal: Soft. Bowel sounds are normal. He exhibits no distension and no mass. There is no tenderness. There is no rebound and no guarding.  Musculoskeletal: Normal range of motion. He exhibits no edema and no tenderness.  Lymphadenopathy:    He has no cervical adenopathy.  Neurological: He is oriented to person, place, and time. He displays normal reflexes. No cranial nerve deficit. He exhibits normal muscle tone. Coordination normal.  Skin: Skin is warm and dry. No rash noted. He is not diaphoretic. No erythema. No pallor.  Psychiatric: He has a normal mood and affect. His behavior is normal. Judgment and thought content normal.     Lab Results  Component Value Date   WBC 7.8 10/23/2010   HGB 14.2 10/23/2010   HCT 42.4 10/23/2010  PLT 188.0  10/23/2010   GLUCOSE 85 10/23/2010   CHOL 215* 07/24/2010   TRIG 51.0 07/24/2010   HDL 43.30 07/24/2010   LDLDIRECT 161.7 07/24/2010   ALT 35 10/23/2010   AST 30 10/23/2010   NA 141 10/23/2010   K 3.6 10/23/2010   CL 106 10/23/2010   CREATININE 0.9 10/23/2010   BUN 14 10/23/2010   CO2 29 10/23/2010   TSH 0.89 10/23/2010   PSA 1.51 10/23/2009   HGBA1C 7.0* 10/23/2010   MICROALBUR 0.6 08/23/2008       Assessment & Plan:

## 2011-02-24 ENCOUNTER — Other Ambulatory Visit: Payer: Self-pay | Admitting: Internal Medicine

## 2011-04-16 ENCOUNTER — Telehealth: Payer: Self-pay | Admitting: Internal Medicine

## 2011-04-16 DIAGNOSIS — E119 Type 2 diabetes mellitus without complications: Secondary | ICD-10-CM

## 2011-04-16 DIAGNOSIS — I10 Essential (primary) hypertension: Secondary | ICD-10-CM

## 2011-04-16 MED ORDER — LOSARTAN POTASSIUM-HCTZ 100-25 MG PO TABS: 1.0000 | ORAL_TABLET | Freq: Every day | ORAL | Status: DC

## 2011-04-16 NOTE — Telephone Encounter (Signed)
The pt called and is hoping to get the Diovan changed to a diff bp med.  He states the copay for this med is $50.00 and he is unable to pay for this.   Thanks!

## 2011-04-16 NOTE — Telephone Encounter (Signed)
done

## 2011-05-28 ENCOUNTER — Encounter: Payer: Self-pay | Admitting: Internal Medicine

## 2011-05-28 ENCOUNTER — Ambulatory Visit (INDEPENDENT_AMBULATORY_CARE_PROVIDER_SITE_OTHER): Payer: 59 | Admitting: Internal Medicine

## 2011-05-28 ENCOUNTER — Other Ambulatory Visit (INDEPENDENT_AMBULATORY_CARE_PROVIDER_SITE_OTHER): Payer: 59

## 2011-05-28 DIAGNOSIS — E119 Type 2 diabetes mellitus without complications: Secondary | ICD-10-CM

## 2011-05-28 DIAGNOSIS — I10 Essential (primary) hypertension: Secondary | ICD-10-CM

## 2011-05-28 DIAGNOSIS — M1990 Unspecified osteoarthritis, unspecified site: Secondary | ICD-10-CM

## 2011-05-28 DIAGNOSIS — E785 Hyperlipidemia, unspecified: Secondary | ICD-10-CM

## 2011-05-28 LAB — CBC WITH DIFFERENTIAL/PLATELET
Basophils Absolute: 0.1 10*3/uL (ref 0.0–0.1)
Basophils Relative: 1.4 % (ref 0.0–3.0)
Eosinophils Absolute: 0.6 10*3/uL (ref 0.0–0.7)
HCT: 44.2 % (ref 39.0–52.0)
Hemoglobin: 14.5 g/dL (ref 13.0–17.0)
Lymphs Abs: 2.9 10*3/uL (ref 0.7–4.0)
MCHC: 32.9 g/dL (ref 30.0–36.0)
Neutro Abs: 2.8 10*3/uL (ref 1.4–7.7)
RBC: 5.1 Mil/uL (ref 4.22–5.81)
RDW: 13.5 % (ref 11.5–14.6)

## 2011-05-28 LAB — COMPREHENSIVE METABOLIC PANEL
ALT: 27 U/L (ref 0–53)
BUN: 11 mg/dL (ref 6–23)
CO2: 30 mEq/L (ref 19–32)
Calcium: 8.8 mg/dL (ref 8.4–10.5)
Chloride: 99 mEq/L (ref 96–112)
Creatinine, Ser: 0.9 mg/dL (ref 0.4–1.5)
GFR: 107.47 mL/min (ref >60.00)
Total Bilirubin: 0.7 mg/dL (ref 0.3–1.2)

## 2011-05-28 LAB — URINALYSIS, ROUTINE W REFLEX MICROSCOPIC
Leukocytes, UA: NEGATIVE
Nitrite: NEGATIVE
Specific Gravity, Urine: >=1.03 (ref 1.000–1.030)
Urine Glucose: 100
Urobilinogen, UA: 0.2 (ref 0.0–1.0)

## 2011-05-28 LAB — SEDIMENTATION RATE: Sed Rate: 10 mm/hr (ref 0–22)

## 2011-05-28 LAB — LIPID PANEL
Cholesterol: 225 mg/dL — ABNORMAL HIGH (ref 0–200)
HDL: 36.2 mg/dL — ABNORMAL LOW (ref >39.00)
Triglycerides: 129 mg/dL (ref 0.0–149.0)
VLDL: 25.8 mg/dL (ref 0.0–40.0)

## 2011-05-28 MED ORDER — CELECOXIB 200 MG PO CAPS: 200.0000 mg | ORAL_CAPSULE | Freq: Two times a day (BID) | ORAL | Status: DC

## 2011-05-28 NOTE — Assessment & Plan Note (Signed)
I will check his CPK FLP CMP today 

## 2011-05-28 NOTE — Assessment & Plan Note (Signed)
His BP is well controlled, I will check his lytes and renal function today 

## 2011-05-28 NOTE — Assessment & Plan Note (Signed)
I will check the a1c and will monitor his renal function

## 2011-05-28 NOTE — Assessment & Plan Note (Signed)
He was given samples of celebrex to take for this

## 2011-05-28 NOTE — Patient Instructions (Signed)

## 2011-05-28 NOTE — Progress Notes (Signed)
Subjective:    Patient ID: [name], male    DOB: 1950-10-04, 61 y.o.   MRN: [id number]  Diabetes He presents for his follow-up diabetic visit. He has type 2 diabetes mellitus. His disease course has been stable. Pertinent negatives for hypoglycemia include no confusion, dizziness, headaches, nervousness/anxiousness, pallor, seizures, speech difficulty or tremors. Pertinent negatives for diabetes include no blurred vision, no chest pain, no fatigue, no foot paresthesias, no foot ulcerations, no polydipsia, no polyphagia, no polyuria, no visual change, no weakness and no weight loss. There are no hypoglycemic complications. Symptoms are stable. There are no diabetic complications. Risk factors for coronary artery disease include no known risk factors. Current diabetic treatment includes oral agent (dual therapy). He is compliant with treatment all of the time. His weight is increasing steadily. He is following a generally unhealthy diet. When asked about meal planning, he reported none. He has not had a previous visit with a dietician. He never participates in exercise. There is no change in his home blood glucose trend. An ACE inhibitor/angiotensin II receptor blocker is being taken. He does not see a podiatrist.Eye exam is current.  Hyperlipidemia This is a chronic problem. The current episode started more than 1 year ago. The problem is controlled. Recent lipid tests were reviewed and are variable. Exacerbating diseases include diabetes and obesity. He has no history of chronic renal disease, hypothyroidism, liver disease or nephrotic syndrome. Factors aggravating his hyperlipidemia include thiazides and fatty foods. Associated symptoms include myalgias. Pertinent negatives include no chest pain, focal sensory loss, focal weakness, leg pain or shortness of breath. Current antihyperlipidemic treatment includes statins. The current treatment provides moderate improvement of lipids. Compliance problems  include adherence to exercise and adherence to diet.       Review of Systems  Constitutional: Negative for fever, chills, weight loss, diaphoresis, activity change, appetite change, fatigue and unexpected weight change.  HENT: Negative.   Eyes: Negative.  Negative for blurred vision.  Respiratory: Negative for apnea, cough, choking, chest tightness, shortness of breath, wheezing and stridor.   Cardiovascular: Negative for chest pain, palpitations and leg swelling.  Gastrointestinal: Negative for nausea, vomiting, abdominal pain, diarrhea, constipation, blood in stool, abdominal distention, anal bleeding and rectal pain.  Genitourinary: Negative for dysuria, urgency, polyuria, frequency, hematuria, flank pain, decreased urine volume, enuresis and difficulty urinating.  Musculoskeletal: Positive for myalgias and arthralgias (large joints). Negative for back pain, joint swelling and gait problem.  Skin: Negative for color change, pallor, rash and wound.  Neurological: Negative for dizziness, tremors, focal weakness, seizures, syncope, facial asymmetry, speech difficulty, weakness, light-headedness, numbness and headaches.  Hematological: Negative for polydipsia, polyphagia and adenopathy. Does not bruise/bleed easily.  Psychiatric/Behavioral: Positive for decreased concentration. Negative for suicidal ideas, hallucinations, behavioral problems, confusion, sleep disturbance, self-injury, dysphoric mood and agitation. The patient is not nervous/anxious and is not hyperactive.        Objective:   Physical Exam  Vitals reviewed. Constitutional: He is oriented to person, place, and time. He appears well-developed and well-nourished. No distress.  HENT:  Head: Normocephalic and atraumatic.  Mouth/Throat: Oropharynx is clear and moist. No oropharyngeal exudate.  Eyes: Conjunctivae are normal. Right eye exhibits no discharge. Left eye exhibits no discharge. No scleral icterus.  Neck: Normal range  of motion. Neck supple. No JVD present. No tracheal deviation present. No thyromegaly present.  Cardiovascular: Normal rate, regular rhythm, normal heart sounds and intact distal pulses.  Exam reveals no gallop and no friction rub.   No  murmur heard. Pulmonary/Chest: Effort normal and breath sounds normal. No stridor. No respiratory distress. He has no wheezes. He has no rales. He exhibits no tenderness.  Abdominal: Soft. Bowel sounds are normal. He exhibits no distension and no mass. There is no tenderness. There is no rebound and no guarding.  Musculoskeletal: Normal range of motion. He exhibits no edema and no tenderness.  Lymphadenopathy:    He has no cervical adenopathy.  Neurological: He is oriented to person, place, and time.  Skin: Skin is warm and dry. No rash noted. He is not diaphoretic. No erythema. No pallor.  Psychiatric: He has a normal mood and affect. His behavior is normal. Judgment and thought content normal.      Lab Results  Component Value Date   WBC 7.8 10/23/2010   HGB 14.2 10/23/2010   HCT 42.4 10/23/2010   PLT 188.0 10/23/2010   GLUCOSE 85 10/23/2010   CHOL 215* 07/24/2010   TRIG 51.0 07/24/2010   HDL 43.30 07/24/2010   LDLDIRECT 161.7 07/24/2010   ALT 35 10/23/2010   AST 30 10/23/2010   NA 141 10/23/2010   K 3.6 10/23/2010   CL 106 10/23/2010   CREATININE 0.9 10/23/2010   BUN 14 10/23/2010   CO2 29 10/23/2010   TSH 0.89 10/23/2010   PSA 1.51 10/23/2009   HGBA1C 7.0* 10/23/2010   MICROALBUR 0.6 08/23/2008      Assessment & Plan:

## 2011-06-02 ENCOUNTER — Telehealth: Payer: Self-pay

## 2011-06-02 NOTE — Telephone Encounter (Signed)
He needs to be seen first and have plain xrays done

## 2011-06-02 NOTE — Telephone Encounter (Signed)
Patient called stating that a plain neck xray was discussed with MD. Patient states that he would like to have an MRI of his neck,back and shoulders ordered instead. Please advise

## 2011-06-02 NOTE — Telephone Encounter (Signed)
Patient notfied and rx scheduled

## 2011-06-07 ENCOUNTER — Ambulatory Visit (INDEPENDENT_AMBULATORY_CARE_PROVIDER_SITE_OTHER): Payer: 59 | Admitting: Internal Medicine

## 2011-06-07 ENCOUNTER — Ambulatory Visit (INDEPENDENT_AMBULATORY_CARE_PROVIDER_SITE_OTHER)
Admission: RE | Admit: 2011-06-07 | Discharge: 2011-06-07 | Disposition: A | Discharge status: Home / Self Care | Payer: 59 | Source: Ambulatory Visit | Attending: Internal Medicine | Admitting: Internal Medicine

## 2011-06-07 ENCOUNTER — Encounter: Payer: Self-pay | Admitting: Internal Medicine

## 2011-06-07 DIAGNOSIS — M542 Cervicalgia: Secondary | ICD-10-CM

## 2011-06-07 DIAGNOSIS — I10 Essential (primary) hypertension: Secondary | ICD-10-CM

## 2011-06-07 DIAGNOSIS — E119 Type 2 diabetes mellitus without complications: Secondary | ICD-10-CM

## 2011-06-07 DIAGNOSIS — F09 Unspecified mental disorder due to known physiological condition: Secondary | ICD-10-CM

## 2011-06-07 DIAGNOSIS — F0390 Unspecified dementia without behavioral disturbance: Secondary | ICD-10-CM

## 2011-06-07 DIAGNOSIS — E785 Hyperlipidemia, unspecified: Secondary | ICD-10-CM

## 2011-06-07 DIAGNOSIS — R4189 Other symptoms and signs involving cognitive functions and awareness: Secondary | ICD-10-CM | POA: Insufficient documentation

## 2011-06-07 MED ORDER — INSULIN DETEMIR 100 UNIT/ML ~~LOC~~ SOLN: 30.0000 [IU] | Freq: Every day | SUBCUTANEOUS | Status: DC

## 2011-06-07 MED ORDER — INSULIN PEN NEEDLE 32G X 4 MM MISC: 1.0000 | Freq: Every day | Status: DC

## 2011-06-07 MED ORDER — LINAGLIPTIN-METFORMIN HCL 2.5-1000 MG PO TABS: 1.0000 | ORAL_TABLET | Freq: Two times a day (BID) | ORAL | Status: DC

## 2011-06-07 NOTE — Assessment & Plan Note (Signed)
Neuro referral

## 2011-06-07 NOTE — Patient Instructions (Signed)

## 2011-06-07 NOTE — Assessment & Plan Note (Signed)
His BP is well controlled 

## 2011-06-07 NOTE — Progress Notes (Signed)
Subjective:    Patient ID: [name], male    DOB: [birth year], 61 y.o.   MRN: [id number]  Diabetes He presents for his follow-up diabetic visit. He has type 2 diabetes mellitus. His disease course has been worsening. Hypoglycemia symptoms include confusion. Pertinent negatives for hypoglycemia include no dizziness, headaches, hunger, mood changes, nervousness/anxiousness, pallor, seizures, sleepiness, speech difficulty, sweats or tremors. Associated symptoms include polydipsia, polyphagia and polyuria. Pertinent negatives for diabetes include no blurred vision, no chest pain, no fatigue, no foot paresthesias, no visual change, no weakness and no weight loss. There are no hypoglycemic complications. Symptoms are worsening. There are no diabetic complications. Current diabetic treatment includes oral agent (dual therapy). He is compliant with treatment most of the time. His weight is stable. He is following a generally unhealthy diet. When asked about meal planning, he reported none. He has not had a previous visit with a dietician. He never participates in exercise. His breakfast blood glucose range is generally >200 mg/dl. His lunch blood glucose range is generally >200 mg/dl. His dinner blood glucose range is generally >200 mg/dl. His highest blood glucose is >200 mg/dl. His overall blood glucose range is >200 mg/dl. An ACE inhibitor/angiotensin II receptor blocker is being taken. He does not see a podiatrist.Eye exam is current.  Neck Pain  This is a new problem. The current episode started more than 1 year ago. The problem occurs intermittently. The problem has been unchanged. The pain is associated with a fall. The pain is present in the midline. The quality of the pain is described as aching. The pain is at a severity of 2/10. The pain is mild. The symptoms are aggravated by bending. The pain is worse during the night. Stiffness is present at night. Pertinent negatives include no chest pain, fever,  headaches, leg pain, numbness, pain with swallowing, paresis, syncope, tingling, trouble swallowing, visual change, weakness or weight loss. He has tried NSAIDs for the symptoms. The treatment provided moderate relief.      Review of Systems  Constitutional: Negative for fever, chills, weight loss, diaphoresis, activity change, appetite change, fatigue and unexpected weight change.  HENT: Positive for neck pain and neck stiffness. Negative for hearing loss, ear pain, nosebleeds, congestion, facial swelling, trouble swallowing, tinnitus and ear discharge.   Eyes: Negative.  Negative for blurred vision.  Respiratory: Negative for apnea, cough, choking, shortness of breath, wheezing and stridor.   Cardiovascular: Negative for chest pain, palpitations, leg swelling and syncope.  Gastrointestinal: Negative for nausea, vomiting, abdominal pain, constipation, blood in stool and abdominal distention.  Genitourinary: Positive for polyuria.  Musculoskeletal: Negative for myalgias, back pain, joint swelling, arthralgias and gait problem.  Skin: Negative for color change, pallor, rash and wound.  Neurological: Negative for dizziness, tingling, tremors, seizures, syncope, facial asymmetry, speech difficulty, weakness, light-headedness, numbness and headaches.  Hematological: Positive for polydipsia and polyphagia. Negative for adenopathy. Does not bruise/bleed easily.  Psychiatric/Behavioral: Positive for behavioral problems (forgetful), confusion and decreased concentration. Negative for suicidal ideas, hallucinations, sleep disturbance, self-injury and dysphoric mood. The patient is not nervous/anxious and is not hyperactive.        Objective:   Physical Exam  Vitals reviewed. Constitutional: He appears well-developed and well-nourished. No distress.  HENT:  Head: Normocephalic and atraumatic.  Mouth/Throat: Oropharynx is clear and moist. No oropharyngeal exudate.  Eyes: Conjunctivae and EOM are  normal. Right eye exhibits no discharge. Left eye exhibits no discharge. No scleral icterus.  Neck: Normal range of motion. Neck supple.  No JVD present. No tracheal deviation present. No thyromegaly present.  Cardiovascular: Normal rate, regular rhythm, normal heart sounds and intact distal pulses.  Exam reveals no gallop and no friction rub.   No murmur heard. Pulmonary/Chest: No stridor.  Musculoskeletal:       Cervical back: Normal. He exhibits normal range of motion, no tenderness, no bony tenderness, no swelling, no edema, no deformity, no laceration, no pain, no spasm and normal pulse.  Lymphadenopathy:    He has no cervical adenopathy.  Neurological: He is alert. He has normal strength. He displays no atrophy, no tremor and normal reflexes. No cranial nerve deficit or sensory deficit. He exhibits normal muscle tone. He displays a negative Romberg sign. He displays no seizure activity. Coordination and gait normal. He displays no Babinski's sign on the right side. He displays no Babinski's sign on the left side.  Reflex Scores:      Tricep reflexes are 1+ on the right side and 1+ on the left side.      Bicep reflexes are 1+ on the right side and 1+ on the left side.      Brachioradialis reflexes are 1+ on the right side and 1+ on the left side.      Patellar reflexes are 1+ on the right side and 1+ on the left side.      Achilles reflexes are 1+ on the right side and 1+ on the left side. Skin: He is not diaphoretic.  Psychiatric: He has a normal mood and affect. Judgment and thought content normal. His mood appears not anxious. His affect is not angry, not blunt, not labile and not inappropriate. His speech is delayed and tangential. His speech is not rapid and/or pressured and not slurred. He is slowed. He is not agitated, not aggressive, is not hyperactive, not withdrawn, not actively hallucinating and not combative. Thought content is not paranoid and not delusional. Cognition and memory  are impaired. He does not express impulsivity or inappropriate judgment. He does not exhibit a depressed mood. He expresses no homicidal and no suicidal ideation. He expresses no suicidal plans and no homicidal plans. He is communicative. He is inattentive.     Lab Results  Component Value Date   WBC 7.1 05/28/2011   HGB 14.5 05/28/2011   HCT 44.2 05/28/2011   PLT 191.0 05/28/2011   GLUCOSE 279* 05/28/2011   CHOL 225* 05/28/2011   TRIG 129.0 05/28/2011   HDL 36.20* 05/28/2011   LDLDIRECT 170.3 05/28/2011   ALT 27 05/28/2011   AST 14 05/28/2011   NA 136 05/28/2011   K 3.7 05/28/2011   CL 99 05/28/2011   CREATININE 0.9 05/28/2011   BUN 11 05/28/2011   CO2 30 05/28/2011   TSH 0.89 10/23/2010   PSA 1.51 10/23/2009   HGBA1C 11.2* 05/28/2011   MICROALBUR 0.6 08/23/2008      Assessment & Plan:

## 2011-06-07 NOTE — Assessment & Plan Note (Signed)
He has not had good BS control so I have changed his metformin to jentadueto for a higher and BID dose of metformin and add on tradjenta and start him on levemir and ask to see diabetic education

## 2011-06-07 NOTE — Assessment & Plan Note (Signed)
I will check a plain xray today and will think about ordering an MRI depending on the results of the plain xray and symptoms

## 2011-06-07 NOTE — Assessment & Plan Note (Signed)
I will ask him to see neurology again

## 2011-06-09 ENCOUNTER — Encounter: Payer: Self-pay | Admitting: Neurology

## 2011-06-22 ENCOUNTER — Telehealth: Payer: Self-pay

## 2011-06-22 DIAGNOSIS — M25511 Pain in right shoulder: Secondary | ICD-10-CM

## 2011-06-22 DIAGNOSIS — I10 Essential (primary) hypertension: Secondary | ICD-10-CM

## 2011-06-22 DIAGNOSIS — E119 Type 2 diabetes mellitus without complications: Secondary | ICD-10-CM

## 2011-06-22 NOTE — Telephone Encounter (Signed)
1/ none of his meds are associated with cough 2/ xrays ordered - ask him to f/up soon this may be caused by his cholesterol med, a lab test (CPK) level is needed 2/ referred to ENDO

## 2011-06-22 NOTE — Telephone Encounter (Signed)
Patient stopped by office completed walk in sheet for the following 1) current BP medication causes extreme cough and would like a med change. 2) Would like a xray order placed for his shoulders 3) Blood sugars has not improved with taliking insulin pen at 30 or 50 units, per pt they have not dropped below 180  Please advise on the above Thanks

## 2011-06-23 NOTE — Telephone Encounter (Signed)
Returned call to patient (cell# VM not set up), called home phone and told pt not available. LM with other for pt to call back

## 2011-06-23 NOTE — Telephone Encounter (Signed)
Per pt he stopped taking BP medication x 3 days and cough subsided. Patient states that "he read on internet were losartan causes "flu like cough". He states that he will start back on diovan

## 2011-07-16 ENCOUNTER — Ambulatory Visit: Payer: 59 | Admitting: *Deleted

## 2011-07-21 ENCOUNTER — Encounter: Payer: Self-pay | Admitting: *Deleted

## 2011-07-21 ENCOUNTER — Encounter: Payer: 59 | Attending: Internal Medicine | Admitting: *Deleted

## 2011-07-21 VITALS — Ht 71.0 in | Wt 250.3 lb

## 2011-07-21 DIAGNOSIS — Z713 Dietary counseling and surveillance: Secondary | ICD-10-CM | POA: Insufficient documentation

## 2011-07-21 DIAGNOSIS — E119 Type 2 diabetes mellitus without complications: Secondary | ICD-10-CM | POA: Insufficient documentation

## 2011-07-21 NOTE — Progress Notes (Signed)
  Medical Nutrition Therapy:  Appt start time: 1100 end time:  1200.   Assessment:  Primary concerns today: Patient visiting today for diabetes education. Patient was diagnosed with type 2 diabetes 6-7 years ago. He attended classes here at that time. He had been doing well controlling his blood glucose up until the last 6 months. HgbA1c elevated from ~7.0 to 11.2. He is retired and does not have a regular schedule. Wakes up around 11am-12pm and goes to bed around 3am. He usually eats one large meal daily and snacks frequently in the evening until bedtime. He has been checking his blood glucose 5 times daily, with results around 250. He reports weight loss of 10 pounds over the last 2 months.   MEDICATIONS: Levemir, Jentadueto, pitavastatin calcium   DIETARY INTAKE:  Usual eating pattern includes 3 meals and 2-3 snacks per day.  Everyday foods include Green beans, steak, sweet potatoes, ceral.  Avoided foods include none.    24-hr recall:  B ( AM): None usually, sometimes leftovers from dinner Snk ( AM): None  L ( PM): None, hamburger Snk ( PM): None D ( PM): Green beans (1 cup), corn (1 cup), sweet potatoes (1 cup), grilled steak (6 ounces), or spaghetti with meat sauce, bread Snk ( PM): cereal (froasted flakes, 2 portions), whole milk (3/4 cup), granola bar Beverages: coffee with cream, orange juice occasionally, water  Usual physical activity: gym, walks on treadmill, stationary bike, weights, 1 day/week, 90 minutes  Estimated energy needs: 1800 calories 200 g carbohydrates 135 g protein 50 g fat  Progress Towards Goal(s):  In progress.   Nutritional Diagnosis:  NI-5.8.4 Inconsistent carbohydrate intake As related to knowledge deficit, irregular schedule.  As evidenced by patient report of one large meal daily and frequent snacking at night, and HgbA1c 11.2.    Intervention:  Nutrition counseling. Discussed Carbohydrate Counting with patient. We reviewed foods with carbs, meal  planning, the importance of consistent carb intake at meals, and portion control. We briefly discussed weight loss as a means to improve blood glucose control.   Goals: 1. 3-4 carb servings at meals, 1-2 servings at snacks.  2. Eat 3 meals daily.  3. Measure portion sizes.  4. Continue exercising at the gym. Increase frequency to 2 days weekly.  5. Weight loss of 3-4 pounds by next appointment.   Handouts given during visit include:  Living well with diabetes  Yellow carb counting handout  Monitoring/Evaluation:  Dietary intake, exercise, blood glucose, and body weight in 1 month(s).

## 2011-07-21 NOTE — Patient Instructions (Signed)
Goals: 1. 3-4 carb servings at meals, 1-2 servings at snacks.  2. Eat 3 meals daily.  3. Measure portion sizes.  4. Continue exercising at the gym. Increase frequency to 2 days weekly.  5. Weight loss of 3-4 pounds by next appointment.

## 2011-07-26 ENCOUNTER — Encounter: Payer: Self-pay | Admitting: Endocrinology

## 2011-07-26 ENCOUNTER — Ambulatory Visit (INDEPENDENT_AMBULATORY_CARE_PROVIDER_SITE_OTHER): Payer: 59 | Admitting: Endocrinology

## 2011-07-26 VITALS — BP 142/80 | HR 75 | Temp 97.8°F | Ht 71.0 in | Wt 255.0 lb

## 2011-07-26 DIAGNOSIS — E119 Type 2 diabetes mellitus without complications: Secondary | ICD-10-CM

## 2011-07-26 NOTE — Progress Notes (Signed)
Subjective:    Patient ID: [name], male    DOB: 09/25/1950, 61 y.o.   MRN: [id number]  HPI pt states 10 years h/o dm, complicated by peripheral sensory neuropathy.  he has been on insulin x 1 month.  pt says his diet and exercise are not good. Pt states few years of slight tingling of the feet, but no assoc pain.  Pt says cbg's vary from 230-300. Past Medical History  Diagnosis Date  . Anemia   . Type II or unspecified type diabetes mellitus without mention of complication, not stated as uncontrolled   . GERD (gastroesophageal reflux disease)   . Hyperlipidemia   . HTN (hypertension)   . Osteoarthritis   . Depression   . Adenomatous colon polyp 2011    Past Surgical History  Procedure Date  . Pilonidal cyst excision   . Shoulder surgery     Right    History   Social History  . Marital Status: Married    Spouse Name: N/A    Number of Children: 4  . Years of Education: N/A   Occupational History  . Retired [employer]   Social History Main Topics  . Smoking status: Former Smoker -- 10 years    Quit date: 04/06/1983  . Smokeless tobacco: Not on file  . Alcohol Use: 2.4 oz/week    4 Cans of beer per week     Occasional  . Drug Use: No  . Sexually Active: Yes   Other Topics Concern  . Not on file   Social History Narrative   Regular Exercise -  NO    Current Outpatient Prescriptions on File Prior to Visit  Medication Sig Dispense Refill  . aspirin 81 MG EC tablet Take 81 mg by mouth once a week.        Marland Kitchen buPROPion (WELLBUTRIN XL) 150 MG 24 hr tablet Take 150 mg by mouth daily.      . insulin detemir (LEVEMIR FLEXPEN) 100 UNIT/ML injection Inject 30 Units into the skin at bedtime.  10 mL  12  . Insulin Pen Needle (BD PEN NEEDLE NANO U/F) 32G X 4 MM MISC 1 Act by Does not apply route daily.  30 each  11  . Linagliptin-Metformin HCl (JENTADUETO) 2.08-998 MG TABS Take 1 tablet by mouth 2 (two) times daily.  60 tablet  11  .  losartan-hydrochlorothiazide (HYZAAR) 100-25 MG per tablet Take 1 tablet by mouth daily.  90 tablet  3  . Pitavastatin Calcium (LIVALO) 2 MG TABS Take 1 tablet (2 mg total) by mouth daily.  90 tablet  3  . glimepiride (AMARYL) 4 MG tablet TAKE 1 TABLET BY MOUTH EVERY DAY  30 tablet  5    Allergies  Allergen Reactions  . Statins     REACTION: joint pain    Family History  Problem Relation Age of Onset  . Coronary artery disease Other   . Sudden death Other   . Stroke Other   . Diabetes Other   . Alcohol abuse Other   . Coronary artery disease Other     CABG    BP 142/80  Pulse 75  Temp(Src) 97.8 F (36.6 C) (Oral)  Ht 5\' 11"  (1.803 m)  Wt 255 lb (115.667 kg)  BMI 35.57 kg/m2  SpO2 96%  Review of Systems denies weight loss, blurry vision, headache, chest pain, sob, n/v, urinary frequency, excessive diaphoresis, hypoglycemia, rhinorrhea, and easy bruising.  He has intermittent leg cramps, depression,  and memory loss.      Objective:   Physical Exam VS: see vs page GEN: no distress HEAD: head: no deformity eyes: no periorbital swelling, no proptosis external nose and ears are normal mouth: no lesion seen NECK: supple, thyroid is not enlarged CHEST WALL: no deformity LUNGS: clear to auscultation CV: reg rate and rhythm, no murmur ABD: abdomen is soft, nontender.  no hepatosplenomegaly.  not distended.  no hernia MUSCULOSKELETAL: muscle bulk and strength are grossly normal.  no obvious joint swelling.  gait is normal and steady EXTEMITIES: no deformity.  no ulcer on the feet.  feet are of normal color and temp.  1+ bilat leg edema.  There is bilateral onychomycosis.  Both great toenails are surgically absent, PULSES: dorsalis pedis intact bilat.  no carotid bruit NEURO:  cn 2-12 grossly intact.   readily moves all 4's.  sensation is intact to touch on the feet SKIN:  Normal texture and temperature.  No rash or suspicious lesion is visible.   NODES:  None palpable at the  neck PSYCH: alert, oriented x3.  Does not appear anxious nor depressed.  Lab Results  Component Value Date   HGBA1C 11.2* 05/28/2011      Assessment & Plan:  DM.  needs increased rx.  This a1c causes a high risk to his health. Numbness.  This may improve with improved control. Memory loss.  This complicates the rx of DM.  He needs a simple insulin schedule Leg cramps.  This may be cause by hyperglycemia.

## 2011-07-26 NOTE — Patient Instructions (Addendum)
good diet and exercise habits significanly improve the control of your diabetes.  please let me know if you wish to be referred to a dietician.  high blood sugar is very risky to your health.  you should see an eye doctor every year. controlling your blood pressure and cholesterol drastically reduces the damage diabetes does to your body.  this also applies to quitting smoking.  please discuss these with your doctor.  you should take an aspirin every day, unless you have been advised by a doctor not to. check your blood sugar 2 times a day.  vary the time of day when you check, between before the 3 meals, and at bedtime.  also check if you have symptoms of your blood sugar being too high or too low.  please keep a record of the readings and bring it to your next appointment here.  please call us sooner if your blood sugar goes below 70, or if it stays over 200. Stop glimepiride and "jentadueto." Increase levemir to 50 units each morning. Please come back for a follow-up appointment in 2 weeks.

## 2011-07-29 ENCOUNTER — Ambulatory Visit: Payer: 59 | Admitting: Neurology

## 2011-08-12 ENCOUNTER — Encounter: Payer: Self-pay | Admitting: Endocrinology

## 2011-08-12 ENCOUNTER — Ambulatory Visit (INDEPENDENT_AMBULATORY_CARE_PROVIDER_SITE_OTHER): Payer: 59 | Admitting: Endocrinology

## 2011-08-12 ENCOUNTER — Ambulatory Visit: Payer: 59 | Admitting: Internal Medicine

## 2011-08-12 VITALS — BP 138/82 | HR 68 | Temp 98.0°F | Ht 71.0 in | Wt 251.0 lb

## 2011-08-12 DIAGNOSIS — E119 Type 2 diabetes mellitus without complications: Secondary | ICD-10-CM

## 2011-08-12 NOTE — Patient Instructions (Addendum)
check your blood sugar 2 times a day.  vary the time of day when you check, between before the 3 meals, and at bedtime.  also check if you have symptoms of your blood sugar being too high or too low.  please keep a record of the readings and bring it to your next appointment here.  please call us sooner if your blood sugar goes below 70, or if it stays over 200. Increase levemir to 65 units each morning.   Please come back for a follow-up appointment in 1 month.

## 2011-08-12 NOTE — Progress Notes (Signed)
  Subjective:    Patient ID: [name], male    DOB: [birth year], 61 y.o.   MRN: [id number]  HPI pt returns for f/u of IDDM (dx'ed 2003; complicated by peripheral sensory neuropathy).  he brings a record of his cbg's which i have reviewed today.  It varies from 150-290.  It is in general higher as the day goes on.  He wants to take just 1 injection per day.   Past Medical History  Diagnosis Date  . Anemia   . Type II or unspecified type diabetes mellitus without mention of complication, not stated as uncontrolled   . GERD (gastroesophageal reflux disease)   . Hyperlipidemia   . HTN (hypertension)   . Osteoarthritis   . Depression   . Adenomatous colon polyp 2011    Past Surgical History  Procedure Date  . Pilonidal cyst excision   . Shoulder surgery     Right    History   Social History  . Marital Status: Married    Spouse Name: N/A    Number of Children: 4  . Years of Education: N/A   Occupational History  . Retired [employer]   Social History Main Topics  . Smoking status: Former Smoker -- 10 years    Quit date: 04/06/1983  . Smokeless tobacco: Not on file  . Alcohol Use: 2.4 oz/week    4 Cans of beer per week     Occasional  . Drug Use: No  . Sexually Active: Yes   Other Topics Concern  . Not on file   Social History Narrative   Regular Exercise -  NO    Current Outpatient Prescriptions on File Prior to Visit  Medication Sig Dispense Refill  . amLODipine (NORVASC) 5 MG tablet Take 5 mg by mouth daily.       Marland Kitchen aspirin 81 MG EC tablet Take 81 mg by mouth once a week.        Marland Kitchen buPROPion (WELLBUTRIN XL) 150 MG 24 hr tablet Take 150 mg by mouth daily.      . insulin detemir (LEVEMIR) 100 UNIT/ML injection Inject 65 Units into the skin every morning.       . Insulin Pen Needle (BD PEN NEEDLE NANO U/F) 32G X 4 MM MISC 1 Act by Does not apply route daily.  30 each  11  . Pitavastatin Calcium (LIVALO) 2 MG TABS Take 1 tablet (2 mg total) by mouth  daily.  90 tablet  3    Allergies  Allergen Reactions  . Statins     REACTION: joint pain    Family History  Problem Relation Age of Onset  . Coronary artery disease Other   . Sudden death Other   . Stroke Other   . Diabetes Other   . Alcohol abuse Other   . Coronary artery disease Other     CABG    BP 138/82  Pulse 68  Temp(Src) 98 F (36.7 C) (Oral)  Ht 5\' 11"  (1.803 m)  Wt 251 lb (113.853 kg)  BMI 35.01 kg/m2  SpO2 97%    Review of Systems denies hypoglycemia    Objective:   Physical Exam VITAL SIGNS:  See vs page GENERAL: no distress SKIN:  Insulin injection sites at the anterior abdomen are normal.     Assessment & Plan:  DM, needs increased rx.  therapy limited by pt's need for a simple regimen

## 2011-09-01 ENCOUNTER — Ambulatory Visit: Payer: 59 | Admitting: *Deleted

## 2011-09-13 ENCOUNTER — Encounter: Payer: Self-pay | Admitting: Endocrinology

## 2011-09-13 ENCOUNTER — Ambulatory Visit (INDEPENDENT_AMBULATORY_CARE_PROVIDER_SITE_OTHER): Payer: 59 | Admitting: Endocrinology

## 2011-09-13 VITALS — BP 132/78 | HR 68 | Temp 98.0°F | Ht 71.0 in | Wt 246.0 lb

## 2011-09-13 DIAGNOSIS — Z79899 Other long term (current) drug therapy: Secondary | ICD-10-CM

## 2011-09-13 DIAGNOSIS — E119 Type 2 diabetes mellitus without complications: Secondary | ICD-10-CM

## 2011-09-13 MED ORDER — INSULIN DETEMIR 100 UNIT/ML ~~LOC~~ SOLN: 80.0000 [IU] | SUBCUTANEOUS | Status: DC

## 2011-09-13 NOTE — Progress Notes (Signed)
  Subjective:    Patient ID: [name], male    DOB: November 29, 1950, 61 y.o.   MRN: [id number]  HPI pt returns for f/u of IDDM (dx'ed 2003; complicated by peripheral sensory neuropathy).  he brings a record of his cbg's which i have reviewed today.  It varies from 246-332.  It is in general higher as the day goes on.  He wants to take just 1 injection per day.  Past Medical History  Diagnosis Date  . Anemia   . Type II or unspecified type diabetes mellitus without mention of complication, not stated as uncontrolled   . GERD (gastroesophageal reflux disease)   . Hyperlipidemia   . HTN (hypertension)   . Osteoarthritis   . Depression   . Adenomatous colon polyp 2011    Past Surgical History  Procedure Date  . Pilonidal cyst excision   . Shoulder surgery     Right    History   Social History  . Marital Status: Married    Spouse Name: N/A    Number of Children: 4  . Years of Education: N/A   Occupational History  . Retired [employer]   Social History Main Topics  . Smoking status: Former Smoker -- 10 years    Quit date: 04/06/1983  . Smokeless tobacco: Not on file  . Alcohol Use: 2.4 oz/week    4 Cans of beer per week     Occasional  . Drug Use: No  . Sexually Active: Yes   Other Topics Concern  . Not on file   Social History Narrative   Regular Exercise -  NO    Current Outpatient Prescriptions on File Prior to Visit  Medication Sig Dispense Refill  . amLODipine (NORVASC) 5 MG tablet Take 5 mg by mouth daily.       Marland Kitchen aspirin 81 MG EC tablet Take 81 mg by mouth once a week.        Marland Kitchen buPROPion (WELLBUTRIN XL) 150 MG 24 hr tablet Take 150 mg by mouth daily.      . insulin detemir (LEVEMIR) 100 UNIT/ML injection Inject 80 Units into the skin every morning.  30 mL  11  . Insulin Pen Needle (BD PEN NEEDLE NANO U/F) 32G X 4 MM MISC 1 Act by Does not apply route daily.  30 each  11  . Pitavastatin Calcium (LIVALO) 2 MG TABS Take 1 tablet (2 mg total) by mouth  daily.  90 tablet  3    Allergies  Allergen Reactions  . Statins     REACTION: joint pain    Family History  Problem Relation Age of Onset  . Coronary artery disease Other   . Sudden death Other   . Stroke Other   . Diabetes Other   . Alcohol abuse Other   . Coronary artery disease Other     CABG    BP 132/78  Pulse 68  Temp 98 F (36.7 C) (Oral)  Ht 5\' 11"  (1.803 m)  Wt 246 lb (111.585 kg)  BMI 34.31 kg/m2  SpO2 98%   Review of Systems denies hypoglycemia    Objective:   Physical Exam VITAL SIGNS:  See vs page GENERAL: no distress PSYCH: Alert and oriented x 3.  Does not appear anxious nor depressed.     Assessment & Plan:  DM.  needs increased rx

## 2011-09-13 NOTE — Patient Instructions (Addendum)
check your blood sugar 2 times a day.  vary the time of day when you check, between before the 3 meals, and at bedtime.  also check if you have symptoms of your blood sugar being too high or too low.  please keep a record of the readings and bring it to your next appointment here.  please call us sooner if your blood sugar goes below 70, or if it stays over 200. Increase levemir to 80 units each morning.   Please come back for a follow-up appointment in 1 month.

## 2011-09-16 ENCOUNTER — Telehealth: Payer: Self-pay

## 2011-09-16 NOTE — Telephone Encounter (Signed)
Pt is scheduled for 4 mth follow up with PCP 06/21 and requesting labs prior if needed. Please advise.

## 2011-09-24 ENCOUNTER — Ambulatory Visit: Payer: 59 | Admitting: Internal Medicine

## 2011-09-29 ENCOUNTER — Other Ambulatory Visit (INDEPENDENT_AMBULATORY_CARE_PROVIDER_SITE_OTHER): Payer: 59

## 2011-09-29 ENCOUNTER — Ambulatory Visit (INDEPENDENT_AMBULATORY_CARE_PROVIDER_SITE_OTHER): Payer: 59 | Admitting: Internal Medicine

## 2011-09-29 ENCOUNTER — Telehealth: Payer: Self-pay | Admitting: Endocrinology

## 2011-09-29 ENCOUNTER — Encounter: Payer: Self-pay | Admitting: Internal Medicine

## 2011-09-29 VITALS — BP 136/78 | HR 67 | Temp 97.2°F | Resp 16 | Wt 252.0 lb

## 2011-09-29 DIAGNOSIS — I10 Essential (primary) hypertension: Secondary | ICD-10-CM

## 2011-09-29 DIAGNOSIS — M5416 Radiculopathy, lumbar region: Secondary | ICD-10-CM | POA: Insufficient documentation

## 2011-09-29 DIAGNOSIS — N401 Enlarged prostate with lower urinary tract symptoms: Secondary | ICD-10-CM

## 2011-09-29 DIAGNOSIS — E785 Hyperlipidemia, unspecified: Secondary | ICD-10-CM

## 2011-09-29 DIAGNOSIS — E119 Type 2 diabetes mellitus without complications: Secondary | ICD-10-CM

## 2011-09-29 DIAGNOSIS — R32 Unspecified urinary incontinence: Secondary | ICD-10-CM | POA: Insufficient documentation

## 2011-09-29 DIAGNOSIS — IMO0002 Reserved for concepts with insufficient information to code with codable children: Secondary | ICD-10-CM

## 2011-09-29 LAB — URINALYSIS, ROUTINE W REFLEX MICROSCOPIC
Bilirubin Urine: NEGATIVE
Hgb urine dipstick: NEGATIVE
Leukocytes, UA: NEGATIVE
Nitrite: NEGATIVE
Urobilinogen, UA: 0.2 (ref 0.0–1.0)

## 2011-09-29 LAB — POCT URINALYSIS DIPSTICK
Bilirubin, UA: NEGATIVE
Glucose, UA: NEGATIVE
Ketones, UA: NEGATIVE
Leukocytes, UA: NEGATIVE
Nitrite, UA: NEGATIVE

## 2011-09-29 LAB — CBC WITH DIFFERENTIAL/PLATELET
Basophils Relative: 0.9 % (ref 0.0–3.0)
Eosinophils Relative: 3.8 % (ref 0.0–5.0)
Lymphocytes Relative: 44.8 % (ref 12.0–46.0)
Monocytes Relative: 11.5 % (ref 3.0–12.0)
Neutrophils Relative %: 39 % — ABNORMAL LOW (ref 43.0–77.0)
RBC: 4.77 Mil/uL (ref 4.22–5.81)
WBC: 5.9 10*3/uL (ref 4.5–10.5)

## 2011-09-29 LAB — LIPID PANEL
Total CHOL/HDL Ratio: 5
Triglycerides: 73 mg/dL (ref 0.0–149.0)

## 2011-09-29 LAB — MICROALBUMIN / CREATININE URINE RATIO
Creatinine,U: 156.1 mg/dL
Microalb, Ur: 0.7 mg/dL (ref 0.0–1.9)

## 2011-09-29 LAB — FECAL OCCULT BLOOD, GUAIAC: Fecal Occult Blood: NEGATIVE

## 2011-09-29 LAB — HM DIABETES FOOT EXAM: HM Diabetic Foot Exam: NORMAL

## 2011-09-29 LAB — COMPREHENSIVE METABOLIC PANEL
ALT: 29 U/L (ref 0–53)
BUN: 10 mg/dL (ref 6–23)
CO2: 28 mEq/L (ref 19–32)
Calcium: 9 mg/dL (ref 8.4–10.5)
Chloride: 106 mEq/L (ref 96–112)
Creatinine, Ser: 0.8 mg/dL (ref 0.4–1.5)
GFR: 124.34 mL/min (ref >60.00)
Glucose, Bld: 172 mg/dL — ABNORMAL HIGH (ref 70–99)

## 2011-09-29 LAB — TSH: TSH: 0.57 u[IU]/mL (ref 0.35–5.50)

## 2011-09-29 MED ORDER — INSULIN DETEMIR 100 UNIT/ML ~~LOC~~ SOLN: 80.0000 [IU] | SUBCUTANEOUS | Status: DC

## 2011-09-29 NOTE — Telephone Encounter (Signed)
i sent rx 

## 2011-09-29 NOTE — Telephone Encounter (Signed)
Pt has an appt on July 23.  He wants to stay on the flex-pens until that appointment.  His pharmacy is [facility] on [address].

## 2011-09-29 NOTE — Progress Notes (Signed)
Subjective:    Patient ID: [name], male    DOB: 06/15/1950, 61 y.o.   MRN: [id number]  Back Pain This is a recurrent problem. The current episode started 1 to 4 weeks ago. The problem occurs intermittently. The problem is unchanged. The pain is present in the lumbar spine. The quality of the pain is described as stabbing (spasms). The pain radiates to the right thigh. The pain is at a severity of 2/10. The pain is mild. The pain is worse during the day. The symptoms are aggravated by bending. Stiffness is present in the morning. Associated symptoms include bladder incontinence, leg pain (right leg), numbness (right leg) and tingling (right leg). Pertinent negatives include no abdominal pain, bowel incontinence, chest pain, dysuria, fever, headaches, paresis, paresthesias, pelvic pain, perianal numbness, weakness or weight loss. Risk factors include lack of exercise and obesity. He has tried nothing for the symptoms.  Diabetes He presents for his follow-up diabetic visit. He has type 2 diabetes mellitus. His disease course has been worsening. There are no hypoglycemic associated symptoms. Pertinent negatives for hypoglycemia include no dizziness, headaches, pallor, seizures, speech difficulty or tremors. Associated symptoms include polydipsia, polyphagia and polyuria. Pertinent negatives for diabetes include no blurred vision, no chest pain, no fatigue, no foot paresthesias, no foot ulcerations, no visual change, no weakness and no weight loss. There are no hypoglycemic complications. Symptoms are worsening. There are no diabetic complications. Current diabetic treatment includes intensive insulin program. He is compliant with treatment some of the time. His weight is stable. He is following a generally unhealthy diet. Meal planning includes avoidance of concentrated sweets. He has not had a previous visit with a dietician. He never participates in exercise. His breakfast blood glucose range is  generally 180-200 mg/dl. His lunch blood glucose range is generally >200 mg/dl. His dinner blood glucose range is generally >200 mg/dl. His highest blood glucose is >200 mg/dl. His overall blood glucose range is >200 mg/dl. An ACE inhibitor/angiotensin II receptor blocker is not being taken. He does not see a podiatrist.Eye exam is not current.      Review of Systems  Constitutional: Negative for fever, chills, weight loss, diaphoresis, activity change, appetite change, fatigue and unexpected weight change.  HENT: Negative.   Eyes: Negative.  Negative for blurred vision.  Respiratory: Negative for cough, chest tightness, shortness of breath, wheezing and stridor.   Cardiovascular: Negative for chest pain, palpitations and leg swelling.  Gastrointestinal: Negative for nausea, vomiting, abdominal pain, diarrhea, constipation, blood in stool, abdominal distention, anal bleeding and bowel incontinence.  Genitourinary: Positive for bladder incontinence and polyuria. Negative for dysuria and pelvic pain.  Musculoskeletal: Positive for back pain. Negative for myalgias, joint swelling, arthralgias and gait problem.  Skin: Negative for color change, pallor, rash and wound.  Neurological: Positive for tingling (right leg) and numbness (right leg). Negative for dizziness, tremors, seizures, facial asymmetry, speech difficulty, weakness, light-headedness, headaches and paresthesias.  Hematological: Positive for polydipsia and polyphagia. Negative for adenopathy. Does not bruise/bleed easily.  Psychiatric/Behavioral: Negative.        Objective:   Physical Exam  Vitals reviewed. Constitutional: He is oriented to person, place, and time. He appears well-developed and well-nourished. No distress.  HENT:  Head: Normocephalic and atraumatic.  Mouth/Throat: No oropharyngeal exudate.  Eyes: Conjunctivae are normal. Right eye exhibits no discharge. Left eye exhibits no discharge. No scleral icterus.  Neck:  Normal range of motion. Neck supple. No JVD present. No tracheal deviation present. No thyromegaly present.  Cardiovascular: Normal rate, regular rhythm, normal heart sounds and intact distal pulses.  Exam reveals no gallop and no friction rub.   No murmur heard. Pulmonary/Chest: Effort normal and breath sounds normal. No stridor. No respiratory distress. He has no wheezes. He has no rales. He exhibits no tenderness.  Abdominal: Soft. Bowel sounds are normal. He exhibits no distension and no mass. There is no tenderness. There is no rebound and no guarding.  Musculoskeletal: Normal range of motion. He exhibits no edema and no tenderness.  Lymphadenopathy:    He has no cervical adenopathy.  Neurological: He is alert and oriented to person, place, and time. He has normal strength. He displays no atrophy, no tremor and normal reflexes. No cranial nerve deficit or sensory deficit. He exhibits normal muscle tone. He displays a negative Romberg sign. He displays no seizure activity. Coordination and gait normal. He displays no Babinski's sign on the right side. He displays no Babinski's sign on the left side.  Reflex Scores:      Tricep reflexes are 0 on the right side and 0 on the left side.      Bicep reflexes are 0 on the right side and 0 on the left side.      Brachioradialis reflexes are 0 on the right side and 0 on the left side.      Patellar reflexes are 0 on the right side and 0 on the left side.      Achilles reflexes are 0 on the right side and 0 on the left side.      - SLR in BLE  Skin: Skin is warm and dry. No rash noted. He is not diaphoretic. No erythema. No pallor.  Psychiatric: He has a normal mood and affect. His behavior is normal. Judgment and thought content normal.      Lab Results  Component Value Date   WBC 7.1 05/28/2011   HGB 14.5 05/28/2011   HCT 44.2 05/28/2011   PLT 191.0 05/28/2011   GLUCOSE 279* 05/28/2011   CHOL 225* 05/28/2011   TRIG 129.0 05/28/2011   HDL 36.20*  05/28/2011   LDLDIRECT 170.3 05/28/2011   ALT 27 05/28/2011   AST 14 05/28/2011   NA 136 05/28/2011   K 3.7 05/28/2011   CL 99 05/28/2011   CREATININE 0.9 05/28/2011   BUN 11 05/28/2011   CO2 30 05/28/2011   TSH 0.89 10/23/2010   PSA 1.51 10/23/2009   HGBA1C 11.2* 05/28/2011   MICROALBUR 0.6 08/23/2008      Assessment & Plan:

## 2011-09-29 NOTE — Patient Instructions (Signed)
Diabetes, Type 2 Diabetes is a long-lasting (chronic) disease. In type 2 diabetes, the pancreas does not make enough insulin (a hormone), and the body does not respond normally to the insulin that is made. This type of diabetes was also previously called adult-onset diabetes. It usually occurs after the age of 40, but it can occur at any age.  CAUSES  Type 2 diabetes happens because the pancreasis not making enough insulin or your body has trouble using the insulin that your pancreas does make properly. SYMPTOMS   Drinking more than usual.   Urinating more than usual.   Blurred vision.   Dry, itchy skin.   Frequent infections.   Feeling more tired than usual (fatigue).  DIAGNOSIS The diagnosis of type 2 diabetes is usually made by one of the following tests:  Fasting blood glucose test. You will not eat for at least 8 hours and then take a blood test.   Random blood glucose test. Your blood glucose (sugar) is checked at any time of the day regardless of when you ate.   Oral glucose tolerance test (OGTT). Your blood glucose is measured after you have not eaten (fasted) and then after you drink a glucose containing beverage.  TREATMENT   Healthy eating.   Exercise.   Medicine, if needed.   Monitoring blood glucose.   Seeing your caregiver regularly.  HOME CARE INSTRUCTIONS   Check your blood glucose at least once a day. More frequent monitoring may be necessary, depending on your medicines and on how well your diabetes is controlled. Your caregiver will advise you.   Take your medicine as directed by your caregiver.   Do not smoke.   Make wise food choices. Ask your caregiver for information. Weight loss can improve your diabetes.   Learn about low blood glucose (hypoglycemia) and how to treat it.   Get your eyes checked regularly.   Have a yearly physical exam. Have your blood pressure checked and your blood and urine tested.   Wear a pendant or bracelet saying  that you have diabetes.   Check your feet every night for cuts, sores, blisters, and redness. Let your caregiver know if you have any problems.  SEEK MEDICAL CARE IF:   You have problems keeping your blood glucose in target range.   You have problems with your medicines.   You have symptoms of an illness that do not improve after 24 hours.   You have a sore or wound that is not healing.   You notice a change in vision or a new problem with your vision.   You have a fever.  MAKE SURE YOU:  Understand these instructions.   Will watch your condition.   Will get help right away if you are not doing well or get worse.  Document Released: 03/22/2005 Document Revised: 03/11/2011 Document Reviewed: 09/07/2010 [employer] Patient Information 2012 [employer].Back Pain, Adult Low back pain is very common. About 1 in 5 people have back pain.The cause of low back pain is rarely dangerous. The pain often gets better over time.About half of people with a sudden onset of back pain feel better in just 2 weeks. About 8 in 10 people feel better by 6 weeks.  CAUSES Some common causes of back pain include:  Strain of the muscles or ligaments supporting the spine.   Wear and tear (degeneration) of the spinal discs.   Arthritis.   Direct injury to the back.  DIAGNOSIS Most of the time, the direct   cause of low back pain is not known.However, back pain can be treated effectively even when the exact cause of the pain is unknown.Answering your caregiver's questions about your overall health and symptoms is one of the most accurate ways to make sure the cause of your pain is not dangerous. If your caregiver needs more information, he or she may order lab work or imaging tests (X-rays or MRIs).However, even if imaging tests show changes in your back, this usually does not require surgery. HOME CARE INSTRUCTIONS For many people, back pain returns.Since low back pain is rarely dangerous, it is often  a condition that people can learn to manageon their own.   Remain active. It is stressful on the back to sit or stand in one place. Do not sit, drive, or stand in one place for more than 30 minutes at a time. Take short walks on level surfaces as soon as pain allows.Try to increase the length of time you walk each day.   Do not stay in bed.Resting more than 1 or 2 days can delay your recovery.   Do not avoid exercise or work.Your body is made to move.It is not dangerous to be active, even though your back may hurt.Your back will likely heal faster if you return to being active before your pain is gone.   Pay attention to your body when you bend and lift. Many people have less discomfortwhen lifting if they bend their knees, keep the load close to their bodies,and avoid twisting. Often, the most comfortable positions are those that put less stress on your recovering back.   Find a comfortable position to sleep. Use a firm mattress and lie on your side with your knees slightly bent. If you lie on your back, put a pillow under your knees.   Only take over-the-counter or prescription medicines as directed by your caregiver. Over-the-counter medicines to reduce pain and inflammation are often the most helpful.Your caregiver may prescribe muscle relaxant drugs.These medicines help dull your pain so you can more quickly return to your normal activities and healthy exercise.   Put ice on the injured area.   Put ice in a plastic bag.   Place a towel between your skin and the bag.   Leave the ice on for 15 to 20 minutes, 3 to 4 times a day for the first 2 to 3 days. After that, ice and heat may be alternated to reduce pain and spasms.   Ask your caregiver about trying back exercises and gentle massage. This may be of some benefit.   Avoid feeling anxious or stressed.Stress increases muscle tension and can worsen back pain.It is important to recognize when you are anxious or stressed and  learn ways to manage it.Exercise is a great option.  SEEK MEDICAL CARE IF:  You have pain that is not relieved with rest or medicine.   You have pain that does not improve in 1 week.   You have new symptoms.   You are generally not feeling well.  SEEK IMMEDIATE MEDICAL CARE IF:   You have pain that radiates from your back into your legs.   You develop new bowel or bladder control problems.   You have unusual weakness or numbness in your arms or legs.   You develop nausea or vomiting.   You develop abdominal pain.   You feel faint.  Document Released: 03/22/2005 Document Revised: 03/11/2011 Document Reviewed: 08/10/2010 [employer] Patient Information 2012 [employer]. 

## 2011-09-30 NOTE — Telephone Encounter (Signed)
Informed pt rx sent to [facility] on [address] and to callback office with any questions/concerns.

## 2011-09-30 NOTE — Assessment & Plan Note (Signed)
His BP is well controlled, today I will check his lytes and renal function 

## 2011-09-30 NOTE — Assessment & Plan Note (Addendum)
He has some alarming signs and symptoms so I have asked him to get an MRI done to see if he has HNP, impingement, spinal stenosis, tumor

## 2011-09-30 NOTE — Assessment & Plan Note (Signed)
I am concerned that he is not getting very good control of his DM II, I will check his a1c today

## 2011-09-30 NOTE — Assessment & Plan Note (Signed)
I am concerned that the incontinence may be coming from an issue in his low back so I have ordered an MRI

## 2011-10-05 ENCOUNTER — Telehealth: Payer: Self-pay | Admitting: *Deleted

## 2011-10-05 DIAGNOSIS — M25552 Pain in left hip: Secondary | ICD-10-CM

## 2011-10-05 NOTE — Telephone Encounter (Signed)
Wife states husband has appt to have MRI done on Friday. He is also wanting MRI done of his hip along with the back. Was told by them to call md to get authorization to add hip. Dis advise wife md is out of office this pm will be address tomorrow when md return... 10/05/11@2 :19pm/LMB

## 2011-10-06 NOTE — Telephone Encounter (Signed)
Mri of left hip has been ordered

## 2011-10-06 NOTE — Telephone Encounter (Signed)
Patient notified/LMOVM 

## 2011-10-08 ENCOUNTER — Other Ambulatory Visit: Payer: 59

## 2011-10-10 ENCOUNTER — Ambulatory Visit
Admission: RE | Admit: 2011-10-10 | Discharge: 2011-10-10 | Disposition: A | Discharge status: Home / Self Care | Payer: 59 | Source: Ambulatory Visit | Attending: Internal Medicine | Admitting: Internal Medicine

## 2011-10-10 DIAGNOSIS — M25552 Pain in left hip: Secondary | ICD-10-CM

## 2011-10-10 DIAGNOSIS — R32 Unspecified urinary incontinence: Secondary | ICD-10-CM

## 2011-10-10 DIAGNOSIS — M5416 Radiculopathy, lumbar region: Secondary | ICD-10-CM

## 2011-10-11 ENCOUNTER — Other Ambulatory Visit: Payer: Self-pay | Admitting: Internal Medicine

## 2011-10-11 DIAGNOSIS — M5416 Radiculopathy, lumbar region: Secondary | ICD-10-CM

## 2011-10-26 ENCOUNTER — Encounter: Payer: Self-pay | Admitting: Endocrinology

## 2011-10-26 ENCOUNTER — Ambulatory Visit (INDEPENDENT_AMBULATORY_CARE_PROVIDER_SITE_OTHER): Payer: 59 | Admitting: Endocrinology

## 2011-10-26 VITALS — BP 138/82 | HR 62 | Temp 97.3°F | Ht 71.0 in | Wt 249.0 lb

## 2011-10-26 DIAGNOSIS — E119 Type 2 diabetes mellitus without complications: Secondary | ICD-10-CM

## 2011-10-26 MED ORDER — INSULIN DETEMIR 100 UNIT/ML ~~LOC~~ SOLN: 100.0000 [IU] | SUBCUTANEOUS | Status: DC

## 2011-10-26 NOTE — Progress Notes (Signed)
  Subjective:    Patient ID: [name], male    DOB: 08-23-1950, 61 y.o.   MRN: [id number]  HPI pt returns for f/u of IDDM (dx'ed 2003; complicated by peripheral sensory neuropathy).  no cbg record, but states cbg's are mostly in the 200's.  This is despite increasing the insulin to 80 units qd.  He wants to take just 1 injection per day.  Past Medical History  Diagnosis Date  . Anemia   . Type II or unspecified type diabetes mellitus without mention of complication, not stated as uncontrolled   . GERD (gastroesophageal reflux disease)   . Hyperlipidemia   . HTN (hypertension)   . Osteoarthritis   . Depression   . Adenomatous colon polyp 2011    Past Surgical History  Procedure Date  . Pilonidal cyst excision   . Shoulder surgery     Right    History   Social History  . Marital Status: Married    Spouse Name: N/A    Number of Children: 4  . Years of Education: N/A   Occupational History  . Retired [employer]   Social History Main Topics  . Smoking status: Former Smoker -- 10 years    Quit date: 04/06/1983  . Smokeless tobacco: Not on file  . Alcohol Use: 3.0 oz/week    5 Cans of beer per week     Occasional  . Drug Use: No  . Sexually Active: Yes   Other Topics Concern  . Not on file   Social History Narrative   Regular Exercise -  NO    Current Outpatient Prescriptions on File Prior to Visit  Medication Sig Dispense Refill  . amLODipine (NORVASC) 5 MG tablet Take 5 mg by mouth daily.       Marland Kitchen aspirin 81 MG EC tablet Take 81 mg by mouth once a week.        Marland Kitchen buPROPion (WELLBUTRIN XL) 150 MG 24 hr tablet Take 150 mg by mouth daily.      . Insulin Pen Needle (BD PEN NEEDLE NANO U/F) 32G X 4 MM MISC 1 Act by Does not apply route daily.  30 each  11  . Pitavastatin Calcium (LIVALO) 2 MG TABS Take 1 tablet (2 mg total) by mouth daily.  90 tablet  3    Allergies  Allergen Reactions  . Statins     REACTION: joint pain    Family History  Problem  Relation Age of Onset  . Coronary artery disease Other   . Sudden death Other   . Stroke Other   . Diabetes Other   . Alcohol abuse Other   . Coronary artery disease Other     CABG    BP 138/82  Pulse 62  Temp 97.3 F (36.3 C) (Oral)  Ht 5\' 11"  (1.803 m)  Wt 249 lb (112.946 kg)  BMI 34.73 kg/m2  SpO2 98%   Review of Systems denies hypoglycemia    Objective:   Physical Exam VITAL SIGNS:  See vs page GENERAL: no distress PSYCH: Alert and oriented x 3.  Does not appear anxious nor depressed.     Assessment & Plan:  DM.  needs increased rx

## 2011-10-26 NOTE — Patient Instructions (Addendum)
check your blood sugar 2 times a day.  vary the time of day when you check, between before the 3 meals, and at bedtime.  also check if you have symptoms of your blood sugar being too high or too low.  please keep a record of the readings and bring it to your next appointment here.  please call us sooner if your blood sugar goes below 70, or if it stays over 200. Increase levemir to 100 units each morning.   Please come back for a follow-up appointment in 1 month.

## 2011-12-01 ENCOUNTER — Ambulatory Visit (INDEPENDENT_AMBULATORY_CARE_PROVIDER_SITE_OTHER): Payer: 59 | Admitting: Internal Medicine

## 2011-12-01 ENCOUNTER — Encounter: Payer: Self-pay | Admitting: Endocrinology

## 2011-12-01 ENCOUNTER — Encounter: Payer: Self-pay | Admitting: Internal Medicine

## 2011-12-01 ENCOUNTER — Ambulatory Visit (INDEPENDENT_AMBULATORY_CARE_PROVIDER_SITE_OTHER): Payer: 59 | Admitting: Endocrinology

## 2011-12-01 ENCOUNTER — Ambulatory Visit: Payer: 59 | Admitting: Internal Medicine

## 2011-12-01 VITALS — BP 160/92 | HR 62 | Temp 97.5°F | Ht 71.0 in | Wt 252.0 lb

## 2011-12-01 VITALS — BP 160/92 | HR 62 | Temp 97.5°F | Resp 16 | Wt 252.2 lb

## 2011-12-01 DIAGNOSIS — E119 Type 2 diabetes mellitus without complications: Secondary | ICD-10-CM

## 2011-12-01 DIAGNOSIS — M1990 Unspecified osteoarthritis, unspecified site: Secondary | ICD-10-CM

## 2011-12-01 DIAGNOSIS — E785 Hyperlipidemia, unspecified: Secondary | ICD-10-CM

## 2011-12-01 DIAGNOSIS — I10 Essential (primary) hypertension: Secondary | ICD-10-CM

## 2011-12-01 MED ORDER — CELECOXIB 200 MG PO CAPS: 200.0000 mg | ORAL_CAPSULE | Freq: Every day | ORAL | Status: AC

## 2011-12-01 MED ORDER — OLMESARTAN MEDOXOMIL-HCTZ 40-25 MG PO TABS: 1.0000 | ORAL_TABLET | Freq: Every day | ORAL | Status: DC

## 2011-12-01 NOTE — Assessment & Plan Note (Signed)
He will see Dr. Everardo All today

## 2011-12-01 NOTE — Assessment & Plan Note (Signed)
His BP is not well controlled and he complains of edema so I have asked him to stop amlodipine and will start Benicar-HCT to control the edema and lower the BP

## 2011-12-01 NOTE — Assessment & Plan Note (Signed)
Continue celebrex as needed 

## 2011-12-01 NOTE — Progress Notes (Signed)
  Subjective:    Patient ID: [name], male    DOB: 10-16-1950, 61 y.o.   MRN: [id number]  HPI pt returns for f/u of IDDM (dx'ed 2003; complicated by peripheral sensory neuropathy;  Control has been better since he was changed to a simpler insulin regimen, at his request).  no cbg record, but states cbg's vary from 107-240.  It is in general higher as the day goes on.   Past Medical History  Diagnosis Date  . Anemia   . Type II or unspecified type diabetes mellitus without mention of complication, not stated as uncontrolled   . GERD (gastroesophageal reflux disease)   . Hyperlipidemia   . HTN (hypertension)   . Osteoarthritis   . Depression   . Adenomatous colon polyp 2011    Past Surgical History  Procedure Date  . Pilonidal cyst excision   . Shoulder surgery     Right    History   Social History  . Marital Status: Married    Spouse Name: N/A    Number of Children: 4  . Years of Education: N/A   Occupational History  . Retired [employer]   Social History Main Topics  . Smoking status: Former Smoker -- 10 years    Quit date: 04/06/1983  . Smokeless tobacco: Not on file  . Alcohol Use: 3.0 oz/week    5 Cans of beer per week     Occasional  . Drug Use: No  . Sexually Active: Yes   Other Topics Concern  . Not on file   Social History Narrative   Regular Exercise -  NO    Current Outpatient Prescriptions on File Prior to Visit  Medication Sig Dispense Refill  . aspirin 81 MG EC tablet Take 81 mg by mouth once a week.        Marland Kitchen buPROPion (WELLBUTRIN XL) 150 MG 24 hr tablet Take 150 mg by mouth daily.      . Insulin Pen Needle (BD PEN NEEDLE NANO U/F) 32G X 4 MM MISC 1 Act by Does not apply route daily.  30 each  11  . olmesartan-hydrochlorothiazide (BENICAR HCT) 40-25 MG per tablet Take 1 tablet by mouth daily.  70 tablet  0  . Pitavastatin Calcium (LIVALO) 2 MG TABS Take 1 tablet (2 mg total) by mouth daily.  90 tablet  3    Allergies  Allergen  Reactions  . Statins     REACTION: joint pain    Family History  Problem Relation Age of Onset  . Coronary artery disease Other   . Sudden death Other   . Stroke Other   . Diabetes Other   . Alcohol abuse Other   . Coronary artery disease Other     CABG    BP 160/92  Pulse 62  Temp 97.5 F (36.4 C) (Oral)  Ht 5\' 11"  (1.803 m)  Wt 252 lb (114.306 kg)  BMI 35.15 kg/m2  SpO2 99%  Review of Systems denies hypoglycemia    Objective:   Physical Exam VITAL SIGNS:  See vs page GENERAL: no distress PSYCH: Alert and oriented x 3.  Does not appear anxious nor depressed.     Assessment & Plan:  DM, needs increased rx

## 2011-12-01 NOTE — Progress Notes (Signed)
  Subjective:    Patient ID: [name], male    DOB: 1950-06-03, 61 y.o.   MRN: [id number]  Hypertension This is a chronic problem. The current episode started more than 1 year ago. The problem is unchanged. The problem is uncontrolled. Associated symptoms include peripheral edema. Pertinent negatives include no anxiety, blurred vision, chest pain, headaches, malaise/fatigue, neck pain, orthopnea, palpitations, PND, shortness of breath or sweats. Agents associated with hypertension include NSAIDs. Past treatments include calcium channel blockers. Compliance problems include exercise and diet.       Review of Systems  Constitutional: Negative for fever, chills, malaise/fatigue, diaphoresis, activity change, appetite change, fatigue and unexpected weight change.  HENT: Negative.  Negative for neck pain.   Eyes: Negative.  Negative for blurred vision.  Respiratory: Negative for shortness of breath.   Cardiovascular: Positive for leg swelling. Negative for chest pain, palpitations, orthopnea and PND.  Gastrointestinal: Negative for nausea, vomiting, abdominal pain, diarrhea, constipation, blood in stool and anal bleeding.  Genitourinary: Negative.   Musculoskeletal: Positive for arthralgias (shoulders). Negative for myalgias, back pain, joint swelling and gait problem.  Skin: Negative for color change, pallor, rash and wound.  Neurological: Negative.  Negative for headaches.  Hematological: Negative for adenopathy. Does not bruise/bleed easily.  Psychiatric/Behavioral: Negative.        Objective:   Physical Exam  Vitals reviewed. Constitutional: He is oriented to person, place, and time. He appears well-developed and well-nourished. No distress.  HENT:  Head: Normocephalic and atraumatic.  Mouth/Throat: Oropharynx is clear and moist. No oropharyngeal exudate.  Eyes: Conjunctivae are normal. Right eye exhibits no discharge. Left eye exhibits no discharge. No scleral icterus.  Neck:  Normal range of motion. Neck supple. No JVD present. No tracheal deviation present. No thyromegaly present.  Cardiovascular: Normal rate, regular rhythm, normal heart sounds and intact distal pulses.  Exam reveals no gallop and no friction rub.   No murmur heard. Pulmonary/Chest: Effort normal and breath sounds normal. No stridor. No respiratory distress. He has no wheezes. He has no rales. He exhibits no tenderness.  Abdominal: Soft. Bowel sounds are normal. He exhibits no distension and no mass. There is no tenderness. There is no rebound and no guarding.  Musculoskeletal: Normal range of motion. He exhibits edema (1+  edema in BLE). He exhibits no tenderness.  Lymphadenopathy:    He has no cervical adenopathy.  Neurological: He is oriented to person, place, and time.  Skin: Skin is warm and dry. No rash noted. He is not diaphoretic. No erythema. No pallor.  Psychiatric: He has a normal mood and affect. His behavior is normal. Judgment and thought content normal.      Lab Results  Component Value Date   WBC 5.9 09/29/2011   HGB 13.7 09/29/2011   HCT 42.1 09/29/2011   PLT 195.0 09/29/2011   GLUCOSE 172* 09/29/2011   CHOL 200 09/29/2011   TRIG 73.0 09/29/2011   HDL 42.00 09/29/2011   LDLDIRECT 170.3 05/28/2011   LDLCALC 143* 09/29/2011   ALT 29 09/29/2011   AST 19 09/29/2011   NA 141 09/29/2011   K 3.7 09/29/2011   CL 106 09/29/2011   CREATININE 0.8 09/29/2011   BUN 10 09/29/2011   CO2 28 09/29/2011   TSH 0.57 09/29/2011   PSA 2.41 09/29/2011   HGBA1C 12.1* 09/29/2011   MICROALBUR 0.7 09/29/2011      Assessment & Plan:

## 2011-12-01 NOTE — Patient Instructions (Signed)

## 2011-12-01 NOTE — Patient Instructions (Addendum)
check your blood sugar 2 times a day.  vary the time of day when you check, between before the 3 meals, and at bedtime.  also check if you have symptoms of your blood sugar being too high or too low.  please keep a record of the readings and bring it to your next appointment here.  please call us sooner if your blood sugar goes below 70, or if it stays over 200.   Increase levemir to 110 units each morning.   Please come back for a follow-up appointment in 1 month.

## 2011-12-20 ENCOUNTER — Other Ambulatory Visit: Payer: Self-pay | Admitting: Internal Medicine

## 2011-12-24 ENCOUNTER — Other Ambulatory Visit: Payer: Self-pay | Admitting: Internal Medicine

## 2012-01-03 ENCOUNTER — Ambulatory Visit: Payer: 59 | Admitting: Endocrinology

## 2012-02-02 ENCOUNTER — Ambulatory Visit (INDEPENDENT_AMBULATORY_CARE_PROVIDER_SITE_OTHER): Payer: 59 | Admitting: Internal Medicine

## 2012-02-02 ENCOUNTER — Encounter: Payer: Self-pay | Admitting: Internal Medicine

## 2012-02-02 ENCOUNTER — Other Ambulatory Visit (INDEPENDENT_AMBULATORY_CARE_PROVIDER_SITE_OTHER): Payer: 59

## 2012-02-02 VITALS — BP 110/80 | HR 61 | Temp 97.7°F | Resp 16 | Wt 253.8 lb

## 2012-02-02 DIAGNOSIS — E119 Type 2 diabetes mellitus without complications: Secondary | ICD-10-CM

## 2012-02-02 DIAGNOSIS — I10 Essential (primary) hypertension: Secondary | ICD-10-CM

## 2012-02-02 DIAGNOSIS — E785 Hyperlipidemia, unspecified: Secondary | ICD-10-CM

## 2012-02-02 LAB — HEMOGLOBIN A1C: Hgb A1c MFr Bld: 9.4 % — ABNORMAL HIGH (ref 4.6–6.5)

## 2012-02-02 LAB — BASIC METABOLIC PANEL
BUN: 8 mg/dL (ref 6–23)
Chloride: 106 mEq/L (ref 96–112)
Potassium: 3.9 mEq/L (ref 3.5–5.1)

## 2012-02-02 NOTE — Progress Notes (Signed)
Subjective:    Patient ID: [name], male    DOB: [birth year], 61 y.o.   MRN: [id number]  Diabetes He presents for his follow-up diabetic visit. He has type 2 diabetes mellitus. His disease course has been stable. There are no hypoglycemic associated symptoms. Pertinent negatives for hypoglycemia include no dizziness, pallor or tremors. Pertinent negatives for diabetes include no blurred vision, no chest pain, no fatigue, no foot paresthesias, no foot ulcerations, no polydipsia, no polyphagia, no polyuria, no visual change, no weakness and no weight loss. There are no hypoglycemic complications. There are no diabetic complications. Current diabetic treatment includes intensive insulin program and insulin injections. He is compliant with treatment most of the time. His weight is increasing steadily. He is following a generally healthy diet. Meal planning includes avoidance of concentrated sweets. He has had a previous visit with a dietician. He never participates in exercise. There is no change in his home blood glucose trend. He does not see a podiatrist.Eye exam is not current.      Review of Systems  Constitutional: Negative for fever, chills, weight loss, diaphoresis, activity change, appetite change, fatigue and unexpected weight change.  HENT: Negative.   Eyes: Negative.  Negative for blurred vision.  Respiratory: Negative for cough, chest tightness, shortness of breath, wheezing and stridor.   Cardiovascular: Negative for chest pain, palpitations and leg swelling.  Gastrointestinal: Negative for nausea, vomiting, abdominal pain, diarrhea, constipation and blood in stool.  Genitourinary: Negative.  Negative for polyuria.  Musculoskeletal: Positive for back pain (somewhat better, on ? new meds from pain mngt). Negative for myalgias, joint swelling, arthralgias and gait problem.  Skin: Negative for color change, pallor, rash and wound.  Neurological: Negative for dizziness, tremors,  weakness and light-headedness.  Hematological: Negative for polydipsia, polyphagia and adenopathy. Does not bruise/bleed easily.  Psychiatric/Behavioral: Negative.        Objective:   Physical Exam  Vitals reviewed. Constitutional: He is oriented to person, place, and time. He appears well-developed and well-nourished. No distress.  HENT:  Head: Normocephalic and atraumatic.  Mouth/Throat: Oropharynx is clear and moist. No oropharyngeal exudate.  Eyes: Conjunctivae normal are normal. Right eye exhibits no discharge. Left eye exhibits no discharge. No scleral icterus.  Neck: Normal range of motion. Neck supple. No JVD present. No tracheal deviation present. No thyromegaly present.  Cardiovascular: Normal rate, regular rhythm, normal heart sounds and intact distal pulses.  Exam reveals no gallop and no friction rub.   No murmur heard. Pulmonary/Chest: Effort normal and breath sounds normal. No stridor. No respiratory distress. He has no wheezes. He has no rales. He exhibits no tenderness.  Abdominal: Soft. Bowel sounds are normal. He exhibits no distension. There is no tenderness. There is no rebound and no guarding.  Musculoskeletal: Normal range of motion. He exhibits edema (1+ edema in BLE). He exhibits no tenderness.  Lymphadenopathy:    He has no cervical adenopathy.  Neurological: He is oriented to person, place, and time.  Skin: Skin is warm and dry. No rash noted. He is not diaphoretic. No erythema. No pallor.  Psychiatric: He has a normal mood and affect. His behavior is normal. Judgment and thought content normal.     Lab Results  Component Value Date   WBC 5.9 09/29/2011   HGB 13.7 09/29/2011   HCT 42.1 09/29/2011   PLT 195.0 09/29/2011   GLUCOSE 172* 09/29/2011   CHOL 200 09/29/2011   TRIG 73.0 09/29/2011   HDL 42.00 09/29/2011   LDLDIRECT  170.3 05/28/2011   LDLCALC 143* 09/29/2011   ALT 29 09/29/2011   AST 19 09/29/2011   NA 141 09/29/2011   K 3.7 09/29/2011   CL 106  09/29/2011   CREATININE 0.8 09/29/2011   BUN 10 09/29/2011   CO2 28 09/29/2011   TSH 0.57 09/29/2011   PSA 2.41 09/29/2011   HGBA1C 12.1* 09/29/2011   MICROALBUR 0.7 09/29/2011       Assessment & Plan:

## 2012-02-02 NOTE — Patient Instructions (Signed)

## 2012-02-06 NOTE — Assessment & Plan Note (Signed)
A1C today. 

## 2012-02-06 NOTE — Assessment & Plan Note (Signed)
His BP is well controlled, I will check his lytes and renal function 

## 2012-03-20 ENCOUNTER — Encounter: Payer: Self-pay | Admitting: Endocrinology

## 2012-03-20 ENCOUNTER — Ambulatory Visit (INDEPENDENT_AMBULATORY_CARE_PROVIDER_SITE_OTHER): Payer: 59 | Admitting: Endocrinology

## 2012-03-20 VITALS — BP 132/74 | HR 84 | Wt 253.0 lb

## 2012-03-20 DIAGNOSIS — IMO0001 Reserved for inherently not codable concepts without codable children: Secondary | ICD-10-CM

## 2012-03-20 MED ORDER — GLUCOSE BLOOD VI STRP: 1.0000 | ORAL_STRIP | Freq: Two times a day (BID) | Status: DC

## 2012-03-20 MED ORDER — INSULIN DETEMIR 100 UNIT/ML ~~LOC~~ SOLN: 120.0000 [IU] | SUBCUTANEOUS | Status: DC

## 2012-03-20 NOTE — Patient Instructions (Addendum)
check your blood sugar 2 times a day.  vary the time of day when you check, between before the 3 meals, and at bedtime.  also check if you have symptoms of your blood sugar being too high or too low.  please keep a record of the readings and bring it to your next appointment here.  please call us sooner if your blood sugar goes below 70, or if it stays over 200.   Increase levemir to 120 units each morning.   Please come back for a follow-up appointment in 6 weeks.

## 2012-03-20 NOTE — Progress Notes (Signed)
  Subjective:    Patient ID: [name], male    DOB: October 06, 1950, 61 y.o.   MRN: [id number]  HPI pt returns for f/u of IDDM (dx'ed 2003; complicated by peripheral sensory neuropathy; control has been better since he was changed to a simpler insulin regimen, at his request).  no cbg record, but states cbg's vary from 111-233.  It is in general higher as the day goes on.   Past Medical History  Diagnosis Date  . Anemia   . Type II or unspecified type diabetes mellitus without mention of complication, not stated as uncontrolled   . GERD (gastroesophageal reflux disease)   . Hyperlipidemia   . HTN (hypertension)   . Osteoarthritis   . Depression   . Adenomatous colon polyp 2011    Past Surgical History  Procedure Date  . Pilonidal cyst excision   . Shoulder surgery     Right    History   Social History  . Marital Status: Married    Spouse Name: N/A    Number of Children: 4  . Years of Education: N/A   Occupational History  . Retired [employer]   Social History Main Topics  . Smoking status: Former Smoker -- 10 years    Quit date: 04/06/1983  . Smokeless tobacco: Not on file  . Alcohol Use: 3.0 oz/week    5 Cans of beer per week     Comment: Occasional  . Drug Use: No  . Sexually Active: Yes   Other Topics Concern  . Not on file   Social History Narrative   Regular Exercise -  NO    Current Outpatient Prescriptions on File Prior to Visit  Medication Sig Dispense Refill  . aspirin 81 MG EC tablet Take 81 mg by mouth once a week.        Marland Kitchen buPROPion (WELLBUTRIN XL) 150 MG 24 hr tablet Take 150 mg by mouth daily.      . Insulin Pen Needle (BD PEN NEEDLE NANO U/F) 32G X 4 MM MISC 1 Act by Does not apply route daily.  30 each  11  . olmesartan-hydrochlorothiazide (BENICAR HCT) 40-25 MG per tablet Take 1 tablet by mouth daily.  70 tablet  0  . Pitavastatin Calcium (LIVALO) 2 MG TABS Take 1 tablet (2 mg total) by mouth daily.  90 tablet  3    Allergies   Allergen Reactions  . Statins     REACTION: joint pain    Family History  Problem Relation Age of Onset  . Coronary artery disease Other   . Sudden death Other   . Stroke Other   . Diabetes Other   . Alcohol abuse Other   . Coronary artery disease Other     CABG    BP 132/74  Pulse 84  Wt 253 lb (114.76 kg)  SpO2 93%  Review of Systems denies hypoglycemia    Objective:   Physical Exam VITAL SIGNS:  See vs page GENERAL: no distress EXTEMITIES: no deformity.  no ulcer on the feet.  feet are of normal color and temp.  1+ bilat leg edema.  There is bilateral onychomycosis.  Both great toenails are surgically absent, PULSES: no carotid bruit NEURO:  sensation is intact to touch on the feet.       Assessment & Plan:  DM, therapy limited by pt's need for a simple regimen.  needs increased rx

## 2012-05-01 ENCOUNTER — Ambulatory Visit (INDEPENDENT_AMBULATORY_CARE_PROVIDER_SITE_OTHER): Payer: BC Managed Care – PPO | Admitting: Endocrinology

## 2012-05-01 ENCOUNTER — Encounter: Payer: Self-pay | Admitting: Endocrinology

## 2012-05-01 VITALS — BP 130/70 | HR 76 | Wt 248.0 lb

## 2012-05-01 DIAGNOSIS — IMO0001 Reserved for inherently not codable concepts without codable children: Secondary | ICD-10-CM

## 2012-05-01 MED ORDER — GLUCOSE BLOOD VI STRP: 1.0000 | ORAL_STRIP | Freq: Two times a day (BID) | Status: DC

## 2012-05-01 MED ORDER — RELION CONFIRM GLUCOSE MONITOR W/DEVICE KIT: 1.0000 | PACK | Freq: Once | Status: DC

## 2012-05-01 MED ORDER — INSULIN DETEMIR 100 UNIT/ML ~~LOC~~ SOLN: 130.0000 [IU] | SUBCUTANEOUS | Status: DC

## 2012-05-01 NOTE — Patient Instructions (Addendum)
check your blood sugar 2 times a day.  vary the time of day when you check, between before the 3 meals, and at bedtime.  also check if you have symptoms of your blood sugar being too high or too low.  please keep a record of the readings and bring it to your next appointment here.  please call us sooner if your blood sugar goes below 70, or if it stays over 200.   Increase levemir to 130 units each morning.   Please come back for a follow-up appointment in 6 weeks.

## 2012-05-01 NOTE — Progress Notes (Signed)
  Subjective:    Patient ID: [name], male    DOB: June 04, 1950, 62 y.o.   MRN: [id number]  HPI pt returns for f/u of IDDM (dx'ed 2003; complicated by peripheral sensory neuropathy; control has been better since he was changed to a simpler insulin regimen, at his request).  no cbg record, but states cbg's vary from 109-298.  There is no trend throughout the day.    Review of Systems denies hypoglycemia.    Objective:   Physical Exam VITAL SIGNS:  See vs page GENERAL: no distress SKIN:  Insulin injection sites at the anterior abdomen are normal       Assessment & Plan:

## 2012-05-12 ENCOUNTER — Telehealth: Payer: Self-pay | Admitting: Endocrinology

## 2012-05-12 ENCOUNTER — Other Ambulatory Visit: Payer: Self-pay | Admitting: Orthopedic Surgery

## 2012-05-12 DIAGNOSIS — M1990 Unspecified osteoarthritis, unspecified site: Secondary | ICD-10-CM

## 2012-05-12 NOTE — Telephone Encounter (Signed)
Pt's wife advised and states an understanding 

## 2012-05-12 NOTE — Telephone Encounter (Signed)
Pt wants script for Boost Control, says reimbursement is higher if he has a script. CB# [phone]

## 2012-05-12 NOTE — Telephone Encounter (Signed)
It can't be prescribed for diabetes.  People with diabetes are advised to follow a balanced diet.

## 2012-05-12 NOTE — Telephone Encounter (Signed)
Pt's wife states he needs this to help with his Diabetes, not digestion

## 2012-05-12 NOTE — Telephone Encounter (Signed)
There would need to be a medical condition for this, such as a digestive problem.  Fortunately, you don't have one.

## 2012-05-19 ENCOUNTER — Other Ambulatory Visit: Payer: BC Managed Care – PPO

## 2012-06-12 ENCOUNTER — Ambulatory Visit: Payer: BC Managed Care – PPO | Admitting: Endocrinology

## 2012-06-14 ENCOUNTER — Ambulatory Visit (INDEPENDENT_AMBULATORY_CARE_PROVIDER_SITE_OTHER): Payer: BC Managed Care – PPO | Admitting: Internal Medicine

## 2012-06-14 ENCOUNTER — Other Ambulatory Visit (INDEPENDENT_AMBULATORY_CARE_PROVIDER_SITE_OTHER): Payer: BC Managed Care – PPO

## 2012-06-14 ENCOUNTER — Encounter: Payer: Self-pay | Admitting: Internal Medicine

## 2012-06-14 VITALS — BP 128/78 | HR 74 | Temp 97.8°F | Resp 16 | Wt 255.0 lb

## 2012-06-14 DIAGNOSIS — IMO0001 Reserved for inherently not codable concepts without codable children: Secondary | ICD-10-CM

## 2012-06-14 DIAGNOSIS — E785 Hyperlipidemia, unspecified: Secondary | ICD-10-CM

## 2012-06-14 DIAGNOSIS — I10 Essential (primary) hypertension: Secondary | ICD-10-CM

## 2012-06-14 LAB — URINALYSIS, ROUTINE W REFLEX MICROSCOPIC
Bilirubin Urine: NEGATIVE
Hgb urine dipstick: NEGATIVE
Leukocytes, UA: NEGATIVE
Nitrite: NEGATIVE

## 2012-06-14 LAB — HM DIABETES FOOT EXAM: HM Diabetic Foot Exam: NORMAL

## 2012-06-14 LAB — COMPREHENSIVE METABOLIC PANEL
ALT: 26 U/L (ref 0–53)
AST: 23 U/L (ref 0–37)
CO2: 30 mEq/L (ref 19–32)
Calcium: 8.6 mg/dL (ref 8.4–10.5)
Chloride: 104 mEq/L (ref 96–112)
GFR: 122.31 mL/min (ref >60.00)
Sodium: 140 mEq/L (ref 135–145)
Total Bilirubin: 0.7 mg/dL (ref 0.3–1.2)
Total Protein: 7.6 g/dL (ref 6.0–8.3)

## 2012-06-14 LAB — CBC WITH DIFFERENTIAL/PLATELET
Basophils Absolute: 0.1 10*3/uL (ref 0.0–0.1)
Eosinophils Absolute: 0.2 10*3/uL (ref 0.0–0.7)
HCT: 43.3 % (ref 39.0–52.0)
Lymphs Abs: 3.3 10*3/uL (ref 0.7–4.0)
MCHC: 33 g/dL (ref 30.0–36.0)
Monocytes Relative: 11.2 % (ref 3.0–12.0)
Platelets: 217 10*3/uL (ref 150.0–400.0)
RDW: 14.2 % (ref 11.5–14.6)

## 2012-06-14 LAB — CK: Total CK: 366 U/L — ABNORMAL HIGH (ref 7–232)

## 2012-06-14 LAB — TSH: TSH: 1.04 u[IU]/mL (ref 0.35–5.50)

## 2012-06-14 LAB — LIPID PANEL: Total CHOL/HDL Ratio: 5

## 2012-06-14 MED ORDER — PRAVASTATIN SODIUM 40 MG PO TABS: 40.0000 mg | ORAL_TABLET | Freq: Every day | ORAL | Status: DC

## 2012-06-14 MED ORDER — COLESEVELAM HCL 3.75 G PO PACK: 1.0000 | PACK | Freq: Every day | ORAL | Status: DC

## 2012-06-14 MED ORDER — CANAGLIFLOZIN 300 MG PO TABS: 1.0000 | ORAL_TABLET | Freq: Every day | ORAL | Status: DC

## 2012-06-14 NOTE — Progress Notes (Signed)
Subjective:    Patient ID: [name], male    DOB: 06-14-1950, 62 y.o.   MRN: [id number]  Diabetes He presents for his follow-up diabetic visit. He has type 2 diabetes mellitus. There are no hypoglycemic associated symptoms. Pertinent negatives for hypoglycemia include no dizziness, headaches or pallor. Associated symptoms include polydipsia, polyphagia and polyuria. Pertinent negatives for diabetes include no blurred vision, no chest pain, no fatigue, no foot paresthesias, no foot ulcerations, no visual change, no weakness and no weight loss. There are no hypoglycemic complications. There are no diabetic complications. Current diabetic treatment includes intensive insulin program and insulin injections. His weight is stable. He is following a generally healthy diet. Meal planning includes avoidance of concentrated sweets. He has not had a previous visit with a dietician. He participates in exercise intermittently. His breakfast blood glucose range is generally 110-130 mg/dl. His lunch blood glucose range is generally 130-140 mg/dl. His dinner blood glucose range is generally 130-140 mg/dl. His highest blood glucose is >200 mg/dl. His overall blood glucose range is 130-140 mg/dl. An ACE inhibitor/angiotensin II receptor blocker is being taken. He does not see a podiatrist.Eye exam is current.      Review of Systems  Constitutional: Negative.  Negative for fever, chills, weight loss, diaphoresis, activity change, appetite change, fatigue and unexpected weight change.  HENT: Negative.   Eyes: Negative.  Negative for blurred vision.  Respiratory: Negative.  Negative for cough, chest tightness, shortness of breath, wheezing and stridor.   Cardiovascular: Negative.  Negative for chest pain, palpitations and leg swelling.  Gastrointestinal: Negative.  Negative for nausea, vomiting, abdominal pain, diarrhea, constipation and blood in stool.  Endocrine: Positive for polydipsia, polyphagia and  polyuria. Negative for cold intolerance and heat intolerance.  Musculoskeletal: Negative.  Negative for myalgias, back pain, joint swelling, arthralgias and gait problem.  Skin: Negative.  Negative for color change, pallor, rash and wound.  Allergic/Immunologic: Negative.   Neurological: Negative.  Negative for dizziness, weakness, light-headedness, numbness and headaches.  Hematological: Negative for adenopathy. Does not bruise/bleed easily.  Psychiatric/Behavioral: Negative.        Objective:   Physical Exam  Vitals reviewed. Constitutional: He is oriented to person, place, and time. He appears well-developed and well-nourished. No distress.  HENT:  Head: Normocephalic and atraumatic.  Mouth/Throat: Oropharynx is clear and moist. No oropharyngeal exudate.  Eyes: Conjunctivae are normal. Right eye exhibits no discharge. Left eye exhibits no discharge. No scleral icterus.  Neck: Normal range of motion. Neck supple. No JVD present. No tracheal deviation present. No thyromegaly present.  Cardiovascular: Normal rate, regular rhythm, normal heart sounds and intact distal pulses.  Exam reveals no gallop and no friction rub.   No murmur heard. Pulmonary/Chest: Effort normal and breath sounds normal. No stridor. No respiratory distress. He has no wheezes. He has no rales. He exhibits no tenderness.  Abdominal: Soft. Bowel sounds are normal. He exhibits no distension and no mass. There is no tenderness. There is no rebound and no guarding.  Musculoskeletal: Normal range of motion. He exhibits no edema and no tenderness.  Lymphadenopathy:    He has no cervical adenopathy.  Neurological: He is oriented to person, place, and time.  Skin: Skin is warm and dry. No rash noted. He is not diaphoretic. No erythema. No pallor.  Psychiatric: He has a normal mood and affect. His behavior is normal. Judgment and thought content normal.     Lab Results  Component Value Date   WBC 5.9 09/29/2011  HGB  13.7 09/29/2011   HCT 42.1 09/29/2011   PLT 195.0 09/29/2011   GLUCOSE 67* 02/02/2012   CHOL 200 09/29/2011   TRIG 73.0 09/29/2011   HDL 42.00 09/29/2011   LDLDIRECT 170.3 05/28/2011   LDLCALC 143* 09/29/2011   ALT 29 09/29/2011   AST 19 09/29/2011   NA 141 02/02/2012   K 3.9 02/02/2012   CL 106 02/02/2012   CREATININE 0.8 02/02/2012   BUN 8 02/02/2012   CO2 30 02/02/2012   TSH 0.57 09/29/2011   PSA 2.41 09/29/2011   HGBA1C 9.4* 02/02/2012   MICROALBUR 0.7 09/29/2011       Assessment & Plan:

## 2012-06-14 NOTE — Patient Instructions (Signed)

## 2012-06-15 ENCOUNTER — Encounter: Payer: Self-pay | Admitting: Internal Medicine

## 2012-06-15 LAB — C-PEPTIDE: C-Peptide: 0.65 ng/mL — ABNORMAL LOW (ref 0.80–3.90)

## 2012-06-18 ENCOUNTER — Encounter: Payer: Self-pay | Admitting: Internal Medicine

## 2012-06-18 NOTE — Assessment & Plan Note (Signed)
His BP is well controlled Today I will check his lytes and renal function 

## 2012-06-18 NOTE — Assessment & Plan Note (Signed)
I have asked him to start welchol

## 2012-06-18 NOTE — Assessment & Plan Note (Signed)
I will check his A1C today and will adjust his insulin dose if needed

## 2012-06-27 ENCOUNTER — Ambulatory Visit (INDEPENDENT_AMBULATORY_CARE_PROVIDER_SITE_OTHER): Payer: BC Managed Care – PPO | Admitting: Endocrinology

## 2012-06-27 ENCOUNTER — Encounter: Payer: Self-pay | Admitting: Endocrinology

## 2012-06-27 VITALS — BP 134/70 | HR 75 | Wt 252.0 lb

## 2012-06-27 DIAGNOSIS — IMO0001 Reserved for inherently not codable concepts without codable children: Secondary | ICD-10-CM

## 2012-06-27 NOTE — Progress Notes (Signed)
Subjective:    Patient ID: [name], male    DOB: 1950/10/26, 62 y.o.   MRN: [id number]  HPI pt returns for f/u of IDDM (dx'ed 2003; complicated by peripheral sensory neuropathy; control has been better since he was changed to a simpler insulin regimen, at his request; he has never had severe hypoglycemia or DKA).  no cbg record, but states cbg's vary from 54-200's.  It was low in the early am, after he was active in his yard the previous day.  It is in general higher as the day goes on.  Past Medical History  Diagnosis Date  . Anemia   . Type II or unspecified type diabetes mellitus without mention of complication, not stated as uncontrolled   . GERD (gastroesophageal reflux disease)   . Hyperlipidemia   . HTN (hypertension)   . Osteoarthritis   . Depression   . Adenomatous colon polyp 2011    Past Surgical History  Procedure Laterality Date  . Pilonidal cyst excision    . Shoulder surgery      Right    History   Social History  . Marital Status: Married    Spouse Name: N/A    Number of Children: 4  . Years of Education: N/A   Occupational History  . Retired [employer]   Social History Main Topics  . Smoking status: Former Smoker -- 10 years    Quit date: 04/06/1983  . Smokeless tobacco: Not on file  . Alcohol Use: 3.0 oz/week    5 Cans of beer per week     Comment: Occasional  . Drug Use: No  . Sexually Active: Yes   Other Topics Concern  . Not on file   Social History Narrative   Regular Exercise -  NO    Current Outpatient Prescriptions on File Prior to Visit  Medication Sig Dispense Refill  . aspirin 81 MG EC tablet Take 81 mg by mouth once a week.        . Blood Glucose Monitoring Suppl (RELION CONFIRM GLUCOSE MONITOR) W/DEVICE KIT 1 Device by Does not apply route once.  1 kit  0  . buPROPion (WELLBUTRIN XL) 150 MG 24 hr tablet Take 150 mg by mouth daily.      . Canagliflozin (INVOKANA) 300 MG TABS Take 1 tablet by mouth daily.  90 tablet   3  . Colesevelam HCl 3.75 G PACK Take 1 packet by mouth daily.  90 each  3  . glucose blood (RELION ULTIMA TEST) test strip 1 each by Other route 2 (two) times daily. And lancets 2/day  100 each  12  . Insulin Pen Needle (BD PEN NEEDLE NANO U/F) 32G X 4 MM MISC 1 Act by Does not apply route daily.  30 each  11  . pravastatin (PRAVACHOL) 40 MG tablet Take 1 tablet (40 mg total) by mouth daily.  90 tablet  3  . olmesartan-hydrochlorothiazide (BENICAR HCT) 40-25 MG per tablet Take 1 tablet by mouth daily.  70 tablet  0   No current facility-administered medications on file prior to visit.    Allergies  Allergen Reactions  . Statins     REACTION: joint pain    Family History  Problem Relation Age of Onset  . Coronary artery disease Other   . Sudden death Other   . Stroke Other   . Diabetes Other   . Alcohol abuse Other   . Coronary artery disease Other  CABG    BP 134/70  Pulse 75  Wt 252 lb (114.306 kg)  BMI 35.16 kg/m2  SpO2 98%    Review of Systems Denies LOC    Objective:   Physical Exam VITAL SIGNS:  See vs page GENERAL: no distress PSYCH: Alert and oriented x 3.  Does not appear anxious nor depressed.   Lab Results  Component Value Date   HGBA1C 9.1* 06/14/2012      Assessment & Plan:  DM: needs increased rx

## 2012-06-27 NOTE — Patient Instructions (Addendum)
check your blood sugar 2 times a day.  vary the time of day when you check, between before the 3 meals, and at bedtime.  also check if you have symptoms of your blood sugar being too high or too low.  please keep a record of the readings and bring it to your next appointment here.  please call us sooner if your blood sugar goes below 70, or if it stays over 200.   Increase levemir to 140 units each morning.  However, if you are going to be active, take only 80 units that day.   Please come back for a follow-up appointment in 6 weeks.

## 2012-07-22 IMAGING — CT CT HEAD W/O CM
1 series · 16 of 30 positions shown, 20 images · non-contrast
Comparison: None

CLINICAL DATA: Headaches and blurred vision.

CT HEAD WITHOUT CONTRAST
TECHNIQUE: Contiguous axial images were obtained from the base of
the skull through the vertex without contrast.

[Series 2: head_seq -c 4.5 h37s st · axial · 0.43mm/px · z∈[+1310,+1439]mm · 16 of 32 slices shown, 20 images]
[im 2/32  brain]
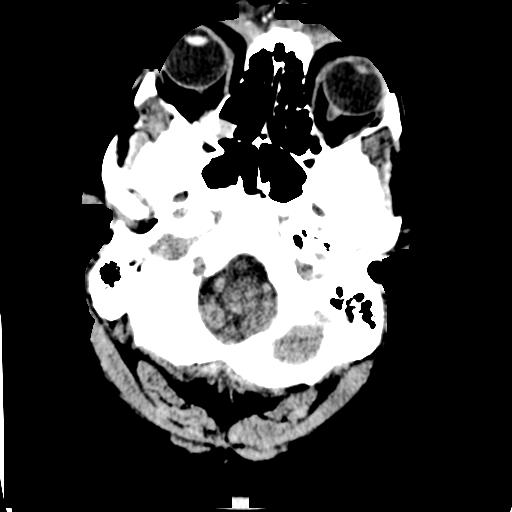
[im 2/32  bone]
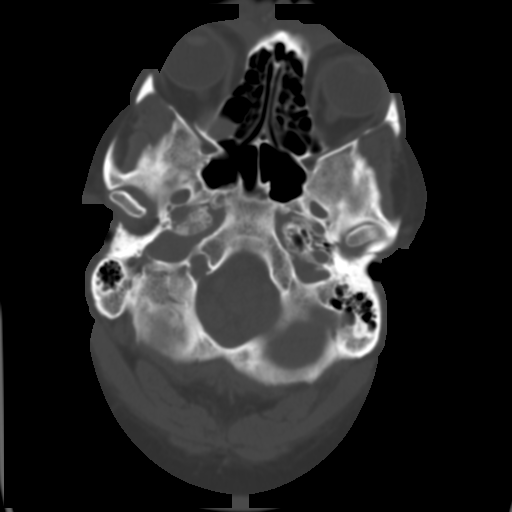
[im 4/32  brain]
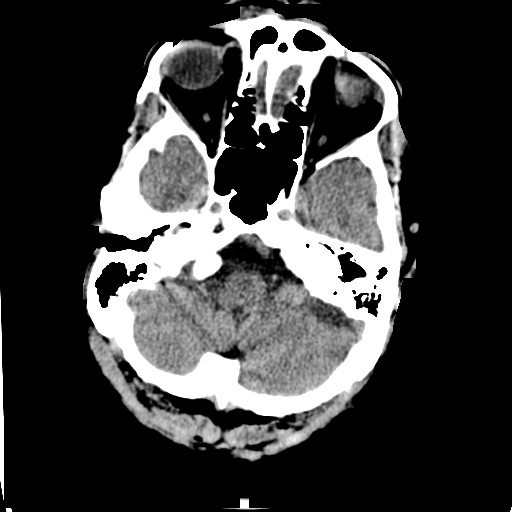
[im 6/32  brain]
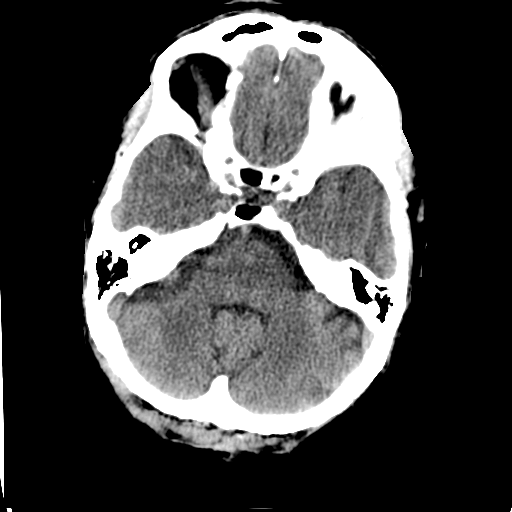
[im 8/32  brain]
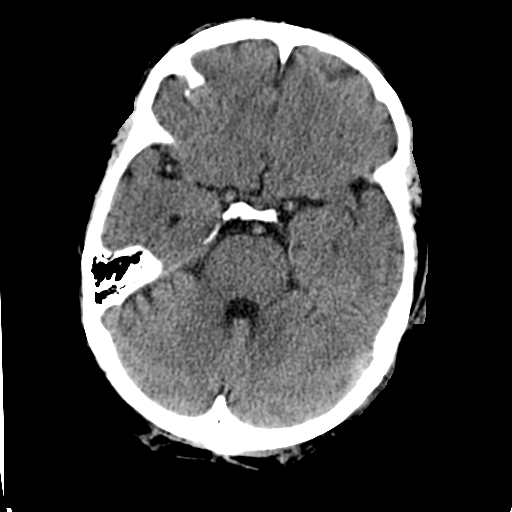
[im 9/32  brain]
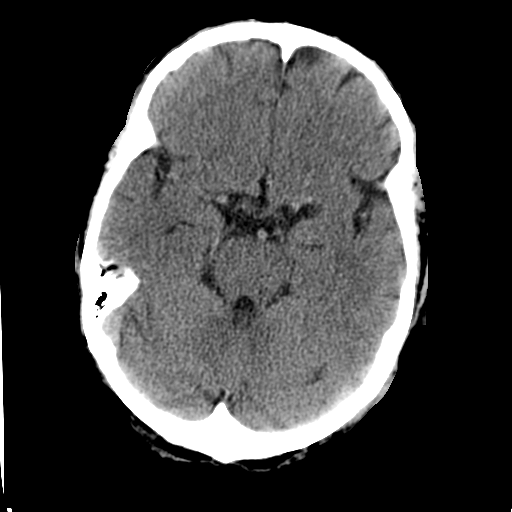
[im 9/32  bone]
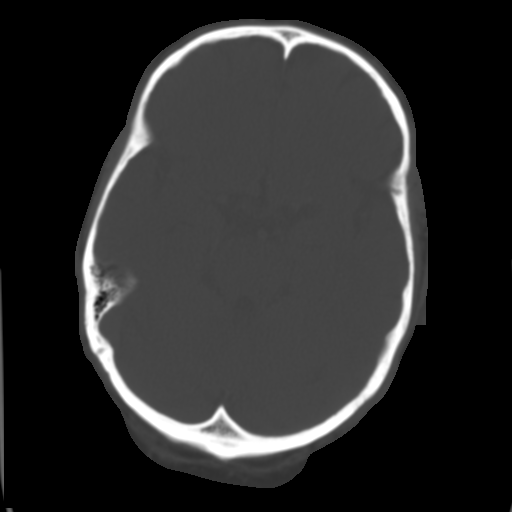
[im 11/32  brain]
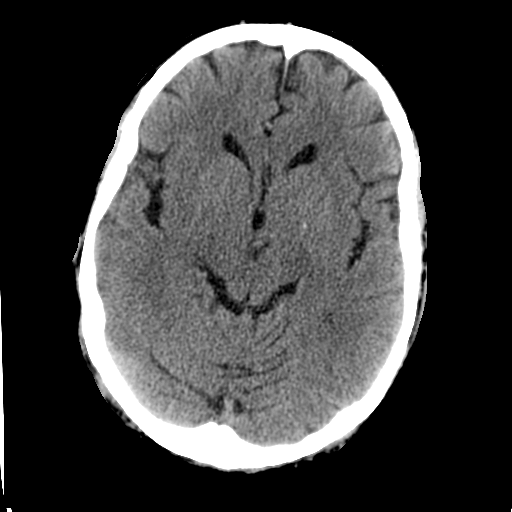
[im 13/32  brain]
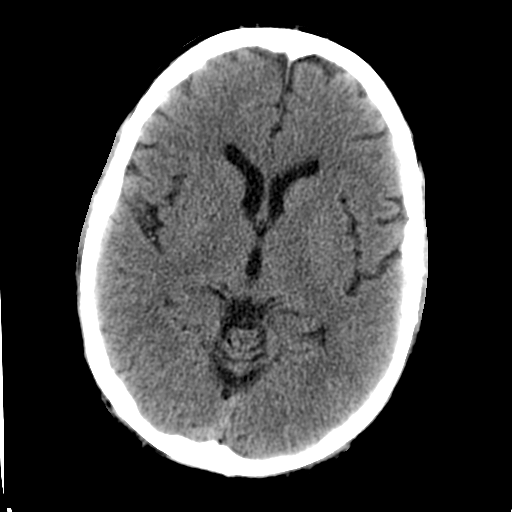
[im 15/32  brain]
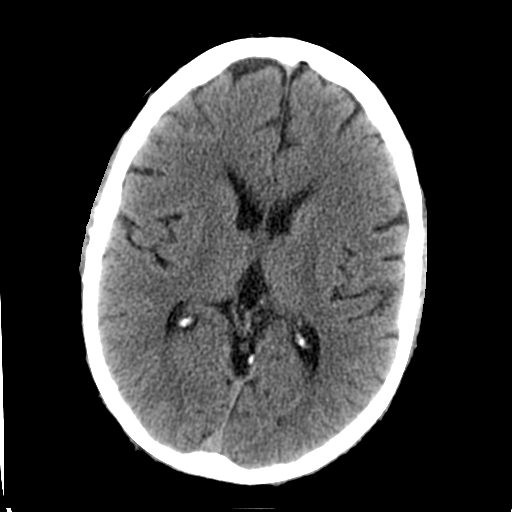
[im 17/32  brain]
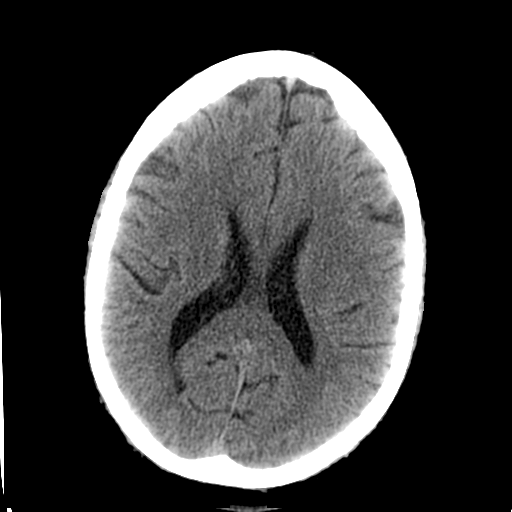
[im 17/32  bone]
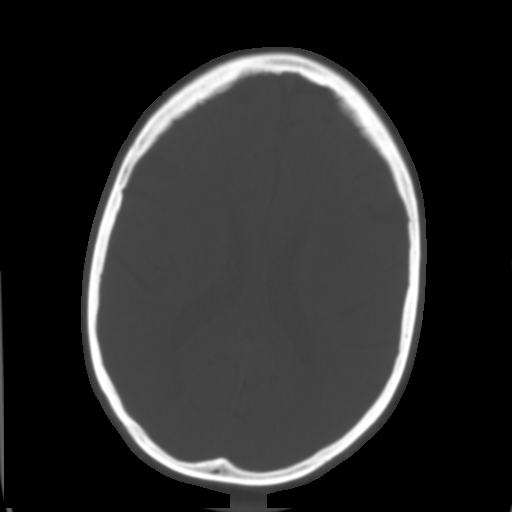
[im 19/32  brain]
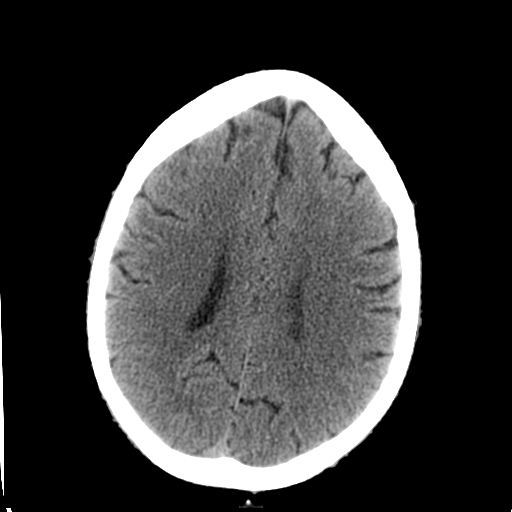
[im 21/32  brain]
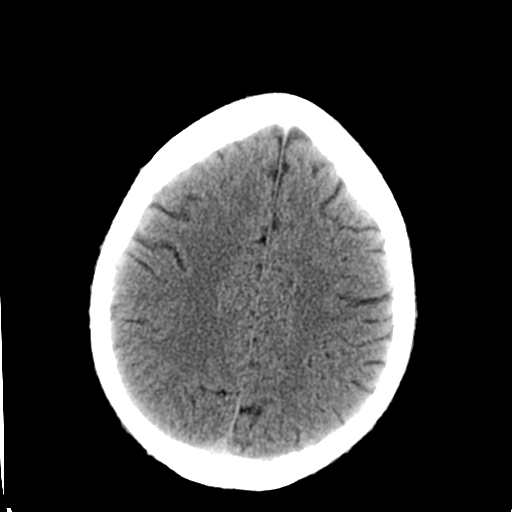
[im 23/32  brain]
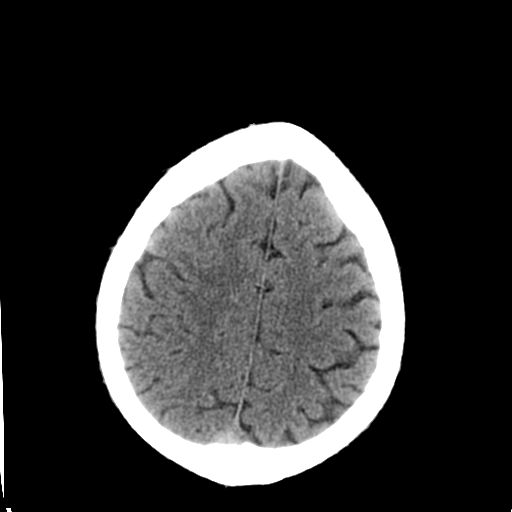
[im 24/32  brain]
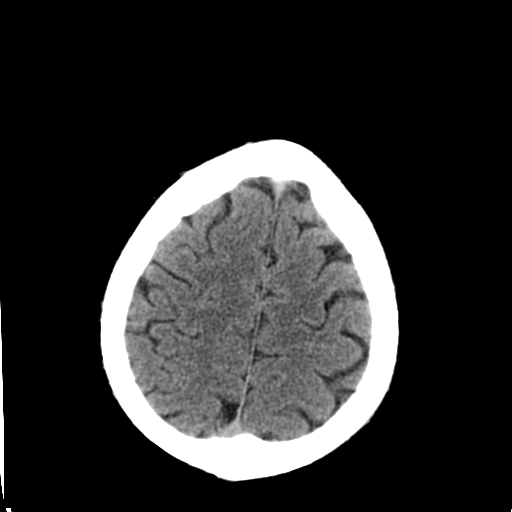
[im 24/32  bone]
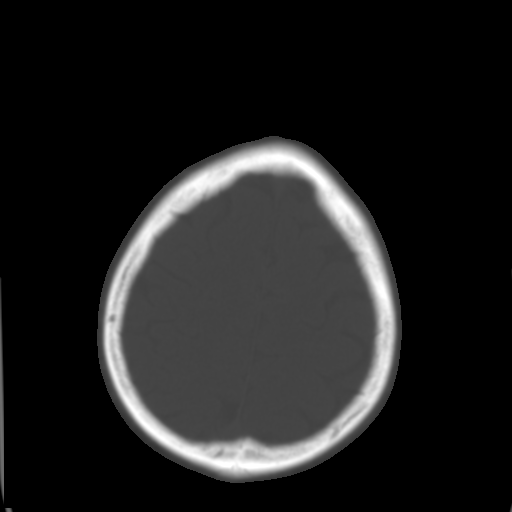
[im 26/32  brain]
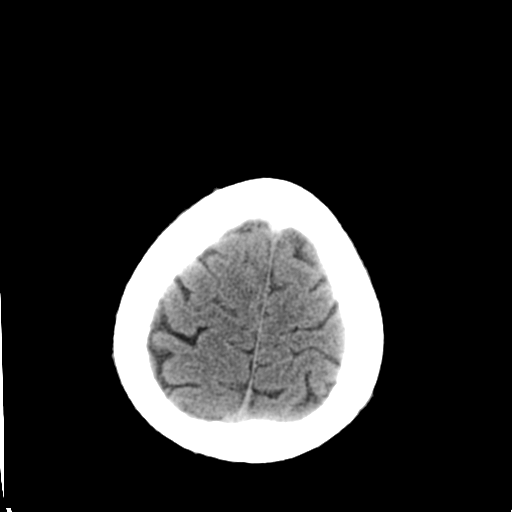
[im 28/32  brain]
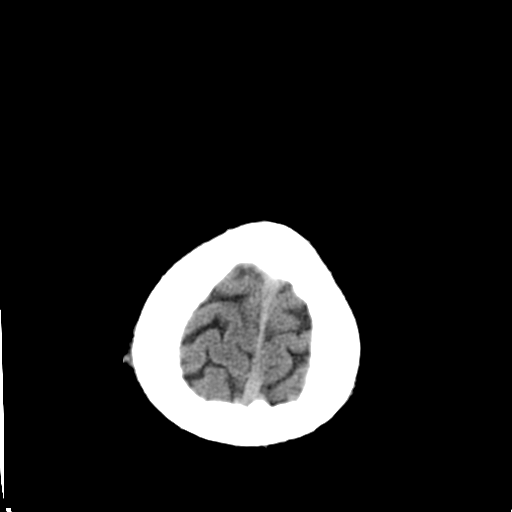
[im 30/32  brain]
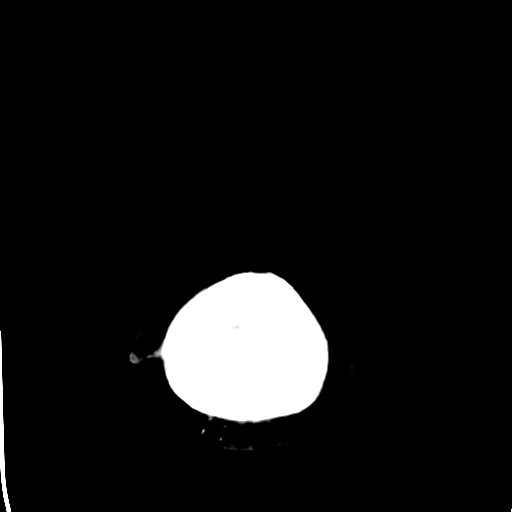

[16 of 30 positions shown; findings below may reference images not displayed]

FINDINGS: No acute intracranial abnormalities are identified,
including mass lesion or mass effect, hydrocephalus, extra-axial
fluid collection, midline shift, hemorrhage, or acute infarction.

The visualized bony calvarium is unremarkable.
IMPRESSION: No evidence of intracranial abnormality.

## 2012-07-27 LAB — HM DIABETES EYE EXAM: HM DIABETIC EYE EXAM: NORMAL

## 2012-08-08 ENCOUNTER — Ambulatory Visit: Payer: BC Managed Care – PPO | Admitting: Endocrinology

## 2012-08-14 ENCOUNTER — Ambulatory Visit: Payer: BC Managed Care – PPO | Admitting: Endocrinology

## 2012-08-15 ENCOUNTER — Encounter: Payer: Self-pay | Admitting: Endocrinology

## 2012-08-15 ENCOUNTER — Ambulatory Visit (INDEPENDENT_AMBULATORY_CARE_PROVIDER_SITE_OTHER): Payer: BC Managed Care – PPO | Admitting: Endocrinology

## 2012-08-15 VITALS — BP 136/80 | HR 72 | Ht 70.0 in | Wt 253.0 lb

## 2012-08-15 DIAGNOSIS — IMO0001 Reserved for inherently not codable concepts without codable children: Secondary | ICD-10-CM

## 2012-08-15 NOTE — Patient Instructions (Addendum)
check your blood sugar 2 times a day.  vary the time of day when you check, between before the 3 meals, and at bedtime.  also check if you have symptoms of your blood sugar being too high or too low.  please keep a record of the readings and bring it to your next appointment here.  please call us sooner if your blood sugar goes below 70, or if it stays over 200.   Please continue levemir, 140 units each morning.  However, if you are going to be active, take only 80 units that day.   Please come back for a follow-up appointment in 6 weeks.

## 2012-08-15 NOTE — Progress Notes (Signed)
Subjective:    Patient ID: [name], male    DOB: [birth year], 62 y.o.   MRN: [id number]  HPI pt returns for f/u of IDDM (dx'ed 2003; complicated by peripheral sensory neuropathy; control has been better since he was changed to a simpler insulin regimen, at his request; he has never had severe hypoglycemia or DKA).  no cbg record, but states cbg's are mostly in the low-100's.   Past Medical History  Diagnosis Date  . Anemia   . Type II or unspecified type diabetes mellitus without mention of complication, not stated as uncontrolled   . GERD (gastroesophageal reflux disease)   . Hyperlipidemia   . HTN (hypertension)   . Osteoarthritis   . Depression   . Adenomatous colon polyp 2011    Past Surgical History  Procedure Laterality Date  . Pilonidal cyst excision    . Shoulder surgery      Right    History   Social History  . Marital Status: Married    Spouse Name: N/A    Number of Children: 4  . Years of Education: N/A   Occupational History  . Retired [employer]   Social History Main Topics  . Smoking status: Former Smoker -- 10 years    Quit date: 04/06/1983  . Smokeless tobacco: Not on file  . Alcohol Use: 3.0 oz/week    5 Cans of beer per week     Comment: Occasional  . Drug Use: No  . Sexually Active: Yes   Other Topics Concern  . Not on file   Social History Narrative   Regular Exercise -  NO    Current Outpatient Prescriptions on File Prior to Visit  Medication Sig Dispense Refill  . aspirin 81 MG EC tablet Take 81 mg by mouth once a week.        . Blood Glucose Monitoring Suppl (RELION CONFIRM GLUCOSE MONITOR) W/DEVICE KIT 1 Device by Does not apply route once.  1 kit  0  . buPROPion (WELLBUTRIN XL) 150 MG 24 hr tablet Take 150 mg by mouth daily.      . Colesevelam HCl 3.75 G PACK Take 1 packet by mouth daily.  90 each  3  . glucose blood (RELION ULTIMA TEST) test strip 1 each by Other route 2 (two) times daily. And lancets 2/day  100 each   12  . insulin detemir (LEVEMIR) 100 UNIT/ML injection Inject 140 Units into the skin every morning. And pen needles 1/day      . Insulin Pen Needle (BD PEN NEEDLE NANO U/F) 32G X 4 MM MISC 1 Act by Does not apply route daily.  30 each  11  . olmesartan-hydrochlorothiazide (BENICAR HCT) 40-25 MG per tablet Take 1 tablet by mouth daily.  70 tablet  0  . pravastatin (PRAVACHOL) 40 MG tablet Take 1 tablet (40 mg total) by mouth daily.  90 tablet  3   No current facility-administered medications on file prior to visit.    Allergies  Allergen Reactions  . Statins     REACTION: joint pain    Family History  Problem Relation Age of Onset  . Coronary artery disease Other   . Sudden death Other   . Stroke Other   . Diabetes Other   . Alcohol abuse Other   . Coronary artery disease Other     CABG    BP 136/80  Pulse 72  Ht 5\' 10"  (1.778 m)  Wt 253 lb (114.76  kg)  BMI 36.3 kg/m2  SpO2 97%  Review of Systems denies hypoglycemia.      Objective:   Physical Exam VITAL SIGNS:  See vs page GENERAL: no distress. Lab Results  Component Value Date   HGBA1C 9.1* 06/14/2012      Assessment & Plan:  DM: control is much better

## 2012-09-26 ENCOUNTER — Encounter: Payer: Self-pay | Admitting: Endocrinology

## 2012-09-26 ENCOUNTER — Ambulatory Visit (INDEPENDENT_AMBULATORY_CARE_PROVIDER_SITE_OTHER): Payer: BC Managed Care – PPO | Admitting: Endocrinology

## 2012-09-26 VITALS — BP 132/74 | HR 78 | Ht 71.0 in | Wt 250.0 lb

## 2012-09-26 DIAGNOSIS — IMO0001 Reserved for inherently not codable concepts without codable children: Secondary | ICD-10-CM

## 2012-09-26 NOTE — Patient Instructions (Addendum)
check your blood sugar 2 times a day.  vary the time of day when you check, between before the 3 meals, and at bedtime.  also check if you have symptoms of your blood sugar being too high or too low.  please keep a record of the readings and bring it to your next appointment here.  please call us sooner if your blood sugar goes below 70, or if it stays over 200.   Please continue levemir, 140 units each morning.  However, if you are going to be active, take only 80 units that day.   Please come back for a follow-up appointment in 3 months.

## 2012-09-26 NOTE — Progress Notes (Signed)
Subjective:    Patient ID: [name], male    DOB: 1950/04/25, 62 y.o.   MRN: [id number]  HPI pt returns for f/u of IDDM (dx'ed 2003; he has moderate sensory neuropathy of the lower extremities; no associated complications control has been better since he was changed to a simpler insulin regimen, at his request; he has never had severe hypoglycemia or DKA).  no cbg record, but states cbg's are well-controlled, if he takes 80 unit in the morning, and is active.  However, it goes over 200 if he takes 140 units in the morning, and is not active.   pt states he feels well in general.   Past Medical History  Diagnosis Date  . Anemia   . Type II or unspecified type diabetes mellitus without mention of complication, not stated as uncontrolled   . GERD (gastroesophageal reflux disease)   . Hyperlipidemia   . HTN (hypertension)   . Osteoarthritis   . Depression   . Adenomatous colon polyp 2011    Past Surgical History  Procedure Laterality Date  . Pilonidal cyst excision    . Shoulder surgery      Right    History   Social History  . Marital Status: Married    Spouse Name: N/A    Number of Children: 4  . Years of Education: N/A   Occupational History  . Retired [employer]   Social History Main Topics  . Smoking status: Former Smoker -- 10 years    Quit date: 04/06/1983  . Smokeless tobacco: Not on file  . Alcohol Use: 3.0 oz/week    5 Cans of beer per week     Comment: Occasional  . Drug Use: No  . Sexually Active: Yes   Other Topics Concern  . Not on file   Social History Narrative   Regular Exercise -  NO    Current Outpatient Prescriptions on File Prior to Visit  Medication Sig Dispense Refill  . aspirin 81 MG EC tablet Take 81 mg by mouth once a week.        . Blood Glucose Monitoring Suppl (RELION CONFIRM GLUCOSE MONITOR) W/DEVICE KIT 1 Device by Does not apply route once.  1 kit  0  . buPROPion (WELLBUTRIN XL) 150 MG 24 hr tablet Take 150 mg by  mouth daily.      . Colesevelam HCl 3.75 G PACK Take 1 packet by mouth daily.  90 each  3  . glucose blood (RELION ULTIMA TEST) test strip 1 each by Other route 2 (two) times daily. And lancets 2/day  100 each  12  . insulin detemir (LEVEMIR) 100 UNIT/ML injection Inject 140 Units into the skin every morning. And pen needles 1/day      . Insulin Pen Needle (BD PEN NEEDLE NANO U/F) 32G X 4 MM MISC 1 Act by Does not apply route daily.  30 each  11  . olmesartan-hydrochlorothiazide (BENICAR HCT) 40-25 MG per tablet Take 1 tablet by mouth daily.  70 tablet  0  . pravastatin (PRAVACHOL) 40 MG tablet Take 1 tablet (40 mg total) by mouth daily.  90 tablet  3   No current facility-administered medications on file prior to visit.    Allergies  Allergen Reactions  . Statins     REACTION: joint pain    Family History  Problem Relation Age of Onset  . Coronary artery disease Other   . Sudden death Other   . Stroke Other   .  Diabetes Other   . Alcohol abuse Other   . Coronary artery disease Other     CABG    BP 132/74  Pulse 78  Ht 5\' 11"  (1.803 m)  Wt 250 lb (113.399 kg)  BMI 34.88 kg/m2  SpO2 98%  Review of Systems denies hypoglycemia and weight change.      Objective:   Physical Exam VITAL SIGNS:  See vs page GENERAL: no distress  Lab Results  Component Value Date   HGBA1C 8.6* 09/26/2012      Assessment & Plan:  DM: he needs increased rx.   This insulin regimen was chosen from multiple options, for its simplicity.  The benefits of glycemic control must be weighed against the risks of hypoglycemia.

## 2012-10-18 ENCOUNTER — Ambulatory Visit (INDEPENDENT_AMBULATORY_CARE_PROVIDER_SITE_OTHER): Payer: BC Managed Care – PPO | Admitting: Internal Medicine

## 2012-10-18 ENCOUNTER — Encounter: Payer: Self-pay | Admitting: Internal Medicine

## 2012-10-18 VITALS — BP 160/90 | HR 65 | Temp 97.6°F | Resp 16 | Ht 71.0 in | Wt 253.0 lb

## 2012-10-18 DIAGNOSIS — E669 Obesity, unspecified: Secondary | ICD-10-CM

## 2012-10-18 DIAGNOSIS — IMO0001 Reserved for inherently not codable concepts without codable children: Secondary | ICD-10-CM

## 2012-10-18 DIAGNOSIS — E785 Hyperlipidemia, unspecified: Secondary | ICD-10-CM

## 2012-10-18 DIAGNOSIS — I10 Essential (primary) hypertension: Secondary | ICD-10-CM

## 2012-10-18 MED ORDER — DAPAGLIFLOZIN PROPANEDIOL 10 MG PO TABS: 1.0000 | ORAL_TABLET | Freq: Every day | ORAL | Status: DC

## 2012-10-18 MED ORDER — NEBIVOLOL HCL 10 MG PO TABS: 10.0000 mg | ORAL_TABLET | Freq: Every day | ORAL | Status: DC

## 2012-10-18 NOTE — Progress Notes (Signed)
Subjective:    Patient ID: [name], male    DOB: [birth year], 62 y.o.   MRN: [id number]  Diabetes He presents for his follow-up diabetic visit. He has type 2 diabetes mellitus. There are no hypoglycemic associated symptoms. Pertinent negatives for hypoglycemia include no dizziness or speech difficulty. Pertinent negatives for diabetes include no blurred vision, no chest pain, no fatigue, no foot paresthesias, no foot ulcerations, no polydipsia, no polyphagia, no polyuria, no visual change, no weakness and no weight loss. There are no hypoglycemic complications. Symptoms are stable. There are no diabetic complications. His weight is increasing steadily. He is following a generally unhealthy diet. When asked about meal planning, he reported none. He has not had a previous visit with a dietician. He never participates in exercise. His home blood glucose trend is increasing steadily. His breakfast blood glucose range is generally 130-140 mg/dl. His lunch blood glucose range is generally 130-140 mg/dl. His dinner blood glucose range is generally >200 mg/dl. His highest blood glucose is >200 mg/dl. His overall blood glucose range is 140-180 mg/dl. An ACE inhibitor/angiotensin II receptor blocker is being taken. He does not see a podiatrist.Eye exam is not current.      Review of Systems  Constitutional: Positive for unexpected weight change (some weight gain). Negative for fever, chills, weight loss, diaphoresis, activity change, appetite change and fatigue.  HENT: Negative.   Eyes: Negative.  Negative for blurred vision.  Respiratory: Negative.  Negative for apnea, cough, choking, chest tightness, shortness of breath, wheezing and stridor.   Cardiovascular: Negative for chest pain, palpitations and leg swelling.  Gastrointestinal: Negative.  Negative for nausea, vomiting, abdominal pain, diarrhea and constipation.  Endocrine: Negative.  Negative for polydipsia, polyphagia and polyuria.   Genitourinary: Negative.  Negative for frequency and hematuria.  Musculoskeletal: Negative.  Negative for myalgias, back pain, joint swelling, arthralgias and gait problem.  Skin: Negative.   Allergic/Immunologic: Negative.   Neurological: Negative.  Negative for dizziness, speech difficulty, weakness, light-headedness and numbness.  Hematological: Negative.  Negative for adenopathy. Does not bruise/bleed easily.  Psychiatric/Behavioral: Negative.        Objective:   Physical Exam  Vitals reviewed. Constitutional: He appears well-developed and well-nourished. No distress.  HENT:  Head: Normocephalic and atraumatic.  Mouth/Throat: Oropharynx is clear and moist. No oropharyngeal exudate.  Eyes: Conjunctivae are normal. Right eye exhibits no discharge. Left eye exhibits no discharge. No scleral icterus.  Neck: Normal range of motion. Neck supple. No JVD present. No tracheal deviation present. No thyromegaly present.  Cardiovascular: Normal rate, regular rhythm, normal heart sounds and intact distal pulses.  Exam reveals no gallop and no friction rub.   No murmur heard. Pulmonary/Chest: Effort normal and breath sounds normal. No stridor. No respiratory distress. He has no wheezes. He has no rales. He exhibits no tenderness.  Abdominal: Soft. Bowel sounds are normal. He exhibits no distension and no mass. There is no tenderness. There is no rebound and no guarding.  Musculoskeletal: Normal range of motion. He exhibits edema (trace edema in BLE). He exhibits no tenderness.  Lymphadenopathy:    He has no cervical adenopathy.  Skin: Skin is warm and dry. No rash noted. He is not diaphoretic. No erythema. No pallor.  Psychiatric: He has a normal mood and affect. His behavior is normal. Judgment and thought content normal.     Lab Results  Component Value Date   WBC 6.9 06/14/2012   HGB 14.3 06/14/2012   HCT 43.3 06/14/2012   PLT  217.0 06/14/2012   GLUCOSE 99 06/14/2012   CHOL 231* 06/14/2012    TRIG 76.0 06/14/2012   HDL 42.90 06/14/2012   LDLDIRECT 174.4 06/14/2012   LDLCALC 143* 09/29/2011   ALT 26 06/14/2012   AST 23 06/14/2012   NA 140 06/14/2012   K 4.3 06/14/2012   CL 104 06/14/2012   CREATININE 0.8 06/14/2012   BUN 16 06/14/2012   CO2 30 06/14/2012   TSH 1.04 06/14/2012   PSA 2.41 09/29/2011   HGBA1C 8.6* 09/26/2012   MICROALBUR 0.7 09/29/2011       Assessment & Plan:

## 2012-10-18 NOTE — Assessment & Plan Note (Signed)
BP is not well controlled Will add bystolic

## 2012-10-18 NOTE — Assessment & Plan Note (Signed)
His A1C is too high, I have asked him to add farxiga to his current regimen

## 2012-10-18 NOTE — Assessment & Plan Note (Signed)
He agrees to work on his diet and exercise regimen to lose weight 

## 2012-10-18 NOTE — Patient Instructions (Signed)

## 2012-10-18 NOTE — Assessment & Plan Note (Signed)
He is doing well on pravachol 

## 2012-11-01 ENCOUNTER — Telehealth: Payer: Self-pay | Admitting: *Deleted

## 2012-11-01 NOTE — Telephone Encounter (Signed)
Pt called states he is experiencing to appears to be Heartburn.  please advise

## 2012-11-01 NOTE — Telephone Encounter (Signed)
Patient with metabolic syndrome and at risk for CAD.  Called patient - left message: ok to take liquid antacid for heartburn or otc zantac, etc. Advised that Dr. Yetta Barre will determine if he needs to be seen in follow-up.

## 2012-11-02 ENCOUNTER — Ambulatory Visit: Payer: BC Managed Care – PPO | Admitting: Cardiology

## 2012-11-03 ENCOUNTER — Ambulatory Visit (INDEPENDENT_AMBULATORY_CARE_PROVIDER_SITE_OTHER): Payer: BC Managed Care – PPO | Admitting: Cardiology

## 2012-11-03 ENCOUNTER — Encounter: Payer: Self-pay | Admitting: Cardiology

## 2012-11-03 VITALS — BP 182/102 | HR 53 | Ht 71.0 in | Wt 253.8 lb

## 2012-11-03 DIAGNOSIS — I498 Other specified cardiac arrhythmias: Secondary | ICD-10-CM

## 2012-11-03 DIAGNOSIS — E119 Type 2 diabetes mellitus without complications: Secondary | ICD-10-CM

## 2012-11-03 DIAGNOSIS — R079 Chest pain, unspecified: Secondary | ICD-10-CM

## 2012-11-03 DIAGNOSIS — R001 Bradycardia, unspecified: Secondary | ICD-10-CM

## 2012-11-03 DIAGNOSIS — E785 Hyperlipidemia, unspecified: Secondary | ICD-10-CM

## 2012-11-03 DIAGNOSIS — R0789 Other chest pain: Secondary | ICD-10-CM

## 2012-11-03 DIAGNOSIS — I10 Essential (primary) hypertension: Secondary | ICD-10-CM

## 2012-11-03 MED ORDER — AMLODIPINE BESYLATE 5 MG PO TABS: 5.0000 mg | ORAL_TABLET | Freq: Every day | ORAL | Status: DC

## 2012-11-03 MED ORDER — NEBIVOLOL HCL 2.5 MG PO TABS: 2.5000 mg | ORAL_TABLET | Freq: Every day | ORAL | Status: DC

## 2012-11-03 NOTE — Assessment & Plan Note (Signed)
Last LDL was 142 on Pravachol, hx of intolerance to statins

## 2012-11-03 NOTE — Assessment & Plan Note (Addendum)
Elevated blood pressure today, also bradycardic with recent blood pressure changes. He also complains of increasing fatigue, his heart rate is 53.  Looking back through the patient's record he has been on Bystolic previously and labetalol both of these were stopped I cannot find a reason for the stopping.  Today I have decreased his Bystolic to 2.5 mg daily and have resumed amlodipine 5 mg daily.

## 2012-11-03 NOTE — Assessment & Plan Note (Signed)
History a previous stress test and cardiac catheterization in 2004 negative for coronary artery disease. Patient does have high risk with diabetes and dyslipidemia. We'll plan stress Myoview to evaluate for any cardiac ischemia.

## 2012-11-03 NOTE — Assessment & Plan Note (Signed)
Decreased his Bystolic to 2.5 mg daily.

## 2012-11-03 NOTE — Progress Notes (Signed)
11/03/2012   PCP: Sanda Linger, MD   Chief Complaint  Patient presents with  . Follow-up    3 month:  C/O feq. chest pressure w/belching over the past year.  Occas. SOB due to "increased gas in chest". +edema.     Primary Cardiologist: Dr. Allyson Sabal  HPI: 62 year old African American male followed by Dr. Allyson Sabal for hypertension and history of normal coronary arteries by cardiac catheterization with normal LV function. Patient does have a history of obstructive sleep apnea on BiPAP and followed by Dr. Tresa Endo, treated hypertension dyslipidemia and insulin-dependent diabetes mellitus. He's here today secondary to chest discomfort more of a burning discomfort in his chest but he is also concerned this could be cardiac.  The discomfort is a pressure in his left anterior chest without radiation some shortness of breath but no nausea or diaphoresis. It has been occurring for one to 2 weeks. Activity does not seem to make it worse. Has been instructed to take Prilosec because the Zantac did not help.  Patient also complains of increasing fatigue. He has had blood pressure medication adjustments recently in the fatigue started after the medication changes.    Allergies  Allergen Reactions  . Statins     REACTION: joint pain    Current Outpatient Prescriptions  Medication Sig Dispense Refill  . aspirin 81 MG EC tablet Take 81 mg by mouth once a week.        . Blood Glucose Monitoring Suppl (RELION CONFIRM GLUCOSE MONITOR) W/DEVICE KIT 1 Device by Does not apply route once.  1 kit  0  . celecoxib (CELEBREX) 200 MG capsule Take 200 mg by mouth 2 (two) times daily as needed for pain.      . Dapagliflozin Propanediol (FARXIGA) 10 MG TABS Take 1 tablet by mouth daily.  90 tablet  3  . divalproex (DEPAKOTE) 500 MG DR tablet Take 1,000 mg by mouth daily.      Marland Kitchen glucose blood (RELION ULTIMA TEST) test strip 1 each by Other route 2 (two) times daily. And lancets 2/day  100 each  12  . insulin  detemir (LEVEMIR) 100 UNIT/ML injection Inject 140 Units into the skin every morning. And pen needles 1/day      . Insulin Pen Needle (BD PEN NEEDLE NANO U/F) 32G X 4 MM MISC 1 Act by Does not apply route daily.  30 each  11  . lamoTRIgine (LAMICTAL) 25 MG tablet Take 25 mg by mouth 2 (two) times daily.      Marland Kitchen olmesartan-hydrochlorothiazide (BENICAR HCT) 40-25 MG per tablet Take 1 tablet by mouth daily.  70 tablet  0  . pravastatin (PRAVACHOL) 40 MG tablet Take 1 tablet (40 mg total) by mouth daily.  90 tablet  3  . amLODipine (NORVASC) 5 MG tablet Take 1 tablet (5 mg total) by mouth daily.  30 tablet  5  . Colesevelam HCl 3.75 G PACK Take 1 packet by mouth daily.  90 each  3  . nebivolol (BYSTOLIC) 2.5 MG tablet Take 1 tablet (2.5 mg total) by mouth daily.  30 tablet  6   No current facility-administered medications for this visit.    Past Medical History  Diagnosis Date  . Anemia   . Type II or unspecified type diabetes mellitus without mention of complication, not stated as uncontrolled   . GERD (gastroesophageal reflux disease)   . Hyperlipidemia   . HTN (hypertension)   . Osteoarthritis   . Depression   .  Adenomatous colon polyp 2011  . OSA on CPAP     Past Surgical History  Procedure Laterality Date  . Pilonidal cyst excision    . Shoulder surgery      Right  . Cardiac catheterization  2004    patent coronary arteries    ZOX:WRUEAVW:UJ colds or fevers, no weight changes,  fatigue Skin:no rashes or ulcers HEENT:no blurred vision, no congestion CV:see HPI PUL:see HPI GI:no diarrhea constipation or melena, no indigestion GU:no hematuria, no dysuria MS:no joint pain, no claudication Neuro:no syncope, no lightheadedness Endo:+ diabetes, no thyroid disease  PHYSICAL EXAM BP 182/102  Pulse 53  Ht 5\' 11"  (1.803 m)  Wt 253 lb 12.8 oz (115.123 kg)  BMI 35.41 kg/m2 General:Pleasant affect, NAD Skin:Warm and dry, brisk capillary refill HEENT:normocephalic, sclera clear,  mucus membranes moist Neck:supple, no JVD, no bruits  Heart:S1S2 slow RRR without murmur, gallup, rub or click Lungs:clear without rales, rhonchi, or wheezes WJX:BJYN, non tender, + BS, do not palpate liver spleen or masses Ext:tr lower ext edema, 2+ pedal pulses, 2+ radial pulses Neuro:alert and oriented, MAE, follows commands, + facial symmetry  WGN:FAOZHY rate 53 no acute changes otherwise.  ASSESSMENT AND PLAN Chest pain at rest History a previous stress test and cardiac catheterization in 2004 negative for coronary artery disease. Patient does have high risk with diabetes and dyslipidemia. We'll plan stress Myoview to evaluate for any cardiac ischemia.  Hyperlipidemia LDL goal < 100 Last LDL was 142 on Pravachol, hx of intolerance to statins   Essential hypertension, benign Elevated blood pressure today, also bradycardic with recent blood pressure changes. He also complains of increasing fatigue, his heart rate is 53.  Looking back through the patient's record he has been on Bystolic previously and labetalol both of these were stopped I cannot find a reason for the stopping.  Today I have decreased his Bystolic to 2.5 mg daily and have resumed amlodipine 5 mg daily.  Symptomatic bradycardia, mildly symptomatic with lack of energy and fatigue Decreased his Bystolic to 2.5 mg daily.    Patient will follow with Dr. Allyson Sabal for test results.

## 2012-11-03 NOTE — Patient Instructions (Addendum)
Take the prilosec daily.  We decreased your Bystolic to 2.5 mg daily, your heart rate is slow.  We resumed amlodipine to 5 mg daily.    We will do a stress myoview to rule out coronary disease. Your physician has requested that you have an exercise stress myoview. For further information please visit [email or web address] Please follow instruction sheet, as given.  Call if increased chest pain or increase shortness of breath.  Follow up with Dr. Allyson Sabal for results.

## 2012-11-06 ENCOUNTER — Telehealth: Payer: Self-pay | Admitting: *Deleted

## 2012-11-06 NOTE — Telephone Encounter (Signed)
ok 

## 2012-11-06 NOTE — Telephone Encounter (Signed)
Pt called requesting Dr Yetta Barre to take over his DM management.  Pt states he is wanting to go to one place for all care.  Please advise

## 2012-11-06 NOTE — Telephone Encounter (Signed)
Pt.notified

## 2012-11-07 ENCOUNTER — Telehealth: Payer: Self-pay | Admitting: *Deleted

## 2012-11-07 NOTE — Telephone Encounter (Signed)
I don't think this is a good idea.

## 2012-11-07 NOTE — Telephone Encounter (Signed)
Left msg for pt to return call to clinic

## 2012-11-07 NOTE — Telephone Encounter (Signed)
Pt called requesting to change his current insulin Rx Levemir to Novolog fast acting.  Please advise

## 2012-11-07 NOTE — Telephone Encounter (Signed)
Spoke with pt advised of MDs msg.

## 2012-11-10 ENCOUNTER — Encounter (HOSPITAL_COMMUNITY): Payer: BC Managed Care – PPO

## 2012-11-11 ENCOUNTER — Other Ambulatory Visit: Payer: Self-pay | Admitting: Internal Medicine

## 2012-11-21 ENCOUNTER — Telehealth (HOSPITAL_COMMUNITY): Payer: Self-pay | Admitting: Cardiovascular Disease

## 2012-11-27 ENCOUNTER — Ambulatory Visit: Payer: BC Managed Care – PPO | Admitting: Cardiovascular Disease

## 2012-12-18 ENCOUNTER — Other Ambulatory Visit (INDEPENDENT_AMBULATORY_CARE_PROVIDER_SITE_OTHER): Payer: BC Managed Care – PPO

## 2012-12-18 ENCOUNTER — Ambulatory Visit (INDEPENDENT_AMBULATORY_CARE_PROVIDER_SITE_OTHER): Payer: BC Managed Care – PPO | Admitting: Internal Medicine

## 2012-12-18 ENCOUNTER — Encounter: Payer: Self-pay | Admitting: Internal Medicine

## 2012-12-18 VITALS — BP 152/84 | HR 77 | Temp 98.1°F | Resp 16 | Wt 251.5 lb

## 2012-12-18 DIAGNOSIS — IMO0001 Reserved for inherently not codable concepts without codable children: Secondary | ICD-10-CM

## 2012-12-18 DIAGNOSIS — E785 Hyperlipidemia, unspecified: Secondary | ICD-10-CM

## 2012-12-18 DIAGNOSIS — I10 Essential (primary) hypertension: Secondary | ICD-10-CM

## 2012-12-18 DIAGNOSIS — M5416 Radiculopathy, lumbar region: Secondary | ICD-10-CM

## 2012-12-18 DIAGNOSIS — IMO0002 Reserved for concepts with insufficient information to code with codable children: Secondary | ICD-10-CM

## 2012-12-18 DIAGNOSIS — Z23 Encounter for immunization: Secondary | ICD-10-CM

## 2012-12-18 LAB — BASIC METABOLIC PANEL
CO2: 29 mEq/L (ref 19–32)
Calcium: 8.7 mg/dL (ref 8.4–10.5)
Chloride: 104 mEq/L (ref 96–112)
Creatinine, Ser: 0.9 mg/dL (ref 0.4–1.5)
Glucose, Bld: 139 mg/dL — ABNORMAL HIGH (ref 70–99)

## 2012-12-18 LAB — LIPID PANEL
Cholesterol: 221 mg/dL — ABNORMAL HIGH (ref 0–200)
HDL: 41 mg/dL (ref >39.00)
Triglycerides: 102 mg/dL (ref 0.0–149.0)
VLDL: 20.4 mg/dL (ref 0.0–40.0)

## 2012-12-18 MED ORDER — LOSARTAN POTASSIUM 100 MG PO TABS: 100.0000 mg | ORAL_TABLET | Freq: Every day | ORAL | Status: DC

## 2012-12-18 MED ORDER — TIZANIDINE HCL 4 MG PO TABS: 4.0000 mg | ORAL_TABLET | Freq: Three times a day (TID) | ORAL | Status: DC | PRN

## 2012-12-18 NOTE — Progress Notes (Signed)
Subjective:    Patient ID: [name], male    DOB: July 31, 1950, 62 y.o.   MRN: [id number]  Hypertension This is a chronic problem. The current episode started more than 1 year ago. The problem has been gradually worsening since onset. The problem is uncontrolled. Pertinent negatives include no anxiety, blurred vision, chest pain, headaches, malaise/fatigue, neck pain, orthopnea, palpitations, peripheral edema, PND, shortness of breath or sweats. Agents associated with hypertension include NSAIDs. Past treatments include beta blockers and calcium channel blockers. The current treatment provides mild improvement. Compliance problems include medication side effects, exercise and diet.       Review of Systems  Constitutional: Negative.  Negative for fever, chills, malaise/fatigue, diaphoresis, activity change, appetite change, fatigue and unexpected weight change.  HENT: Negative.  Negative for neck pain.   Eyes: Negative.  Negative for blurred vision.  Respiratory: Negative.  Negative for cough, choking, chest tightness, shortness of breath and stridor.   Cardiovascular: Negative.  Negative for chest pain, palpitations, orthopnea, leg swelling and PND.  Gastrointestinal: Negative for nausea, vomiting, abdominal pain, diarrhea, constipation and blood in stool.  Endocrine: Positive for polydipsia, polyphagia and polyuria.  Musculoskeletal: Positive for back pain (chronic, unchanged). Negative for myalgias, joint swelling, arthralgias and gait problem.  Skin: Negative.  Negative for color change, pallor, rash and wound.  Allergic/Immunologic: Negative.   Neurological: Negative.  Negative for dizziness, tremors, seizures, syncope, weakness, light-headedness and headaches.  Hematological: Negative.  Negative for adenopathy. Does not bruise/bleed easily.  Psychiatric/Behavioral: Negative.        Objective:   Physical Exam  Vitals reviewed. Constitutional: He is oriented to person, place,  and time. He appears well-developed and well-nourished. No distress.  HENT:  Head: Normocephalic and atraumatic.  Mouth/Throat: Oropharynx is clear and moist. No oropharyngeal exudate.  Eyes: Conjunctivae are normal. Right eye exhibits no discharge. Left eye exhibits no discharge. No scleral icterus.  Neck: Normal range of motion. Neck supple. No JVD present. No tracheal deviation present. No thyromegaly present.  Cardiovascular: Normal rate, regular rhythm, normal heart sounds and intact distal pulses.  Exam reveals no gallop and no friction rub.   No murmur heard. Pulmonary/Chest: Effort normal and breath sounds normal. No stridor. No respiratory distress. He has no wheezes. He has no rales. He exhibits no tenderness.  Abdominal: Soft. Bowel sounds are normal. He exhibits no distension and no mass. There is no tenderness. There is no rebound and no guarding.  Musculoskeletal: Normal range of motion. He exhibits edema (trace edema in BLE). He exhibits no tenderness.  Lymphadenopathy:    He has no cervical adenopathy.  Neurological: He is oriented to person, place, and time.  Skin: Skin is warm and dry. No rash noted. He is not diaphoretic. No erythema. No pallor.      Lab Results  Component Value Date   WBC 6.9 06/14/2012   HGB 14.3 06/14/2012   HCT 43.3 06/14/2012   PLT 217.0 06/14/2012   GLUCOSE 99 06/14/2012   CHOL 231* 06/14/2012   TRIG 76.0 06/14/2012   HDL 42.90 06/14/2012   LDLDIRECT 174.4 06/14/2012   LDLCALC 143* 09/29/2011   ALT 26 06/14/2012   AST 23 06/14/2012   NA 140 06/14/2012   K 4.3 06/14/2012   CL 104 06/14/2012   CREATININE 0.8 06/14/2012   BUN 16 06/14/2012   CO2 30 06/14/2012   TSH 1.04 06/14/2012   PSA 2.41 09/29/2011   HGBA1C 8.6* 09/26/2012   MICROALBUR 0.7 09/29/2011  Assessment & Plan:

## 2012-12-18 NOTE — Assessment & Plan Note (Addendum)
I will recheck his FLP today His LDL is too high so I have asked him to add zetia

## 2012-12-18 NOTE — Assessment & Plan Note (Signed)
Will continue zanaflex as needed

## 2012-12-18 NOTE — Patient Instructions (Signed)
Type 2 Diabetes Mellitus, Adult Type 2 diabetes mellitus, often simply referred to as type 2 diabetes, is a long-lasting (chronic) disease. In type 2 diabetes, the pancreas does not make enough insulin (a hormone), the cells are less responsive to the insulin that is made (insulin resistance), or both. Normally, insulin moves sugars from food into the tissue cells. The tissue cells use the sugars for energy. The lack of insulin or the lack of normal response to insulin causes excess sugars to build up in the blood instead of going into the tissue cells. As a result, high blood sugar (hyperglycemia) develops. The effect of high sugar (glucose) levels can cause many complications. Type 2 diabetes was also previously called adult-onset diabetes but it can occur at any age.  RISK FACTORS  A person is predisposed to developing type 2 diabetes if someone in the family has the disease and also has one or more of the following primary risk factors:  Overweight.  An inactive lifestyle.  A history of consistently eating high-calorie foods. Maintaining a normal weight and regular physical activity can reduce the chance of developing type 2 diabetes. SYMPTOMS  A person with type 2 diabetes may not show symptoms initially. The symptoms of type 2 diabetes appear slowly. The symptoms include:  Increased thirst (polydipsia).  Increased urination (polyuria).  Increased urination during the night (nocturia).  Weight loss. This weight loss may be rapid.  Frequent, recurring infections.  Tiredness (fatigue).  Weakness.  Vision changes, such as blurred vision.  Fruity smell to your breath.  Abdominal pain.  Nausea or vomiting.  Cuts or bruises which are slow to heal.  Tingling or numbness in the hands or feet. DIAGNOSIS Type 2 diabetes is frequently not diagnosed until complications of diabetes are present. Type 2 diabetes is diagnosed when symptoms or complications are present and when blood  glucose levels are increased. Your blood glucose level may be checked by one or more of the following blood tests:  A fasting blood glucose test. You will not be allowed to eat for at least 8 hours before a blood sample is taken.  A random blood glucose test. Your blood glucose is checked at any time of the day regardless of when you ate.  A hemoglobin A1c blood glucose test. A hemoglobin A1c test provides information about blood glucose control over the previous 3 months.  An oral glucose tolerance test (OGTT). Your blood glucose is measured after you have not eaten (fasted) for 2 hours and then after you drink a glucose-containing beverage. TREATMENT   You may need to take insulin or diabetes medicine daily to keep blood glucose levels in the desired range.  You will need to match insulin dosing with exercise and healthy food choices. The treatment goal is to maintain the before meal blood sugar (preprandial glucose) level at 70 130 mg/dL. HOME CARE INSTRUCTIONS   Have your hemoglobin A1c level checked twice a year.  Perform daily blood glucose monitoring as directed by your caregiver.  Monitor urine ketones when you are ill and as directed by your caregiver.  Take your diabetes medicine or insulin as directed by your caregiver to maintain your blood glucose levels in the desired range.  Never run out of diabetes medicine or insulin. It is needed every day.  Adjust insulin based on your intake of carbohydrates. Carbohydrates can raise blood glucose levels but need to be included in your diet. Carbohydrates provide vitamins, minerals, and fiber which are an essential part of   a healthy diet. Carbohydrates are found in fruits, vegetables, whole grains, dairy products, legumes, and foods containing added sugars.    Eat healthy foods. Alternate 3 meals with 3 snacks.  Lose weight if overweight.  Carry a medical alert card or wear your medical alert jewelry.  Carry a 15 gram  carbohydrate snack with you at all times to treat low blood glucose (hypoglycemia). Some examples of 15 gram carbohydrate snacks include:  Glucose tablets, 3 or 4   Glucose gel, 15 gram tube  Raisins, 2 tablespoons (24 grams)  Jelly beans, 6  Animal crackers, 8  Regular pop, 4 ounces (120 mL)  Gummy treats, 9  Recognize hypoglycemia. Hypoglycemia occurs with blood glucose levels of 70 mg/dL and below. The risk for hypoglycemia increases when fasting or skipping meals, during or after intense exercise, and during sleep. Hypoglycemia symptoms can include:  Tremors or shakes.  Decreased ability to concentrate.  Sweating.  Increased heart rate.  Headache.  Dry mouth.  Hunger.  Irritability.  Anxiety.  Restless sleep.  Altered speech or coordination.  Confusion.  Treat hypoglycemia promptly. If you are alert and able to safely swallow, follow the 15:15 rule:  Take 15 20 grams of rapid-acting glucose or carbohydrate. Rapid-acting options include glucose gel, glucose tablets, or 4 ounces (120 mL) of fruit juice, regular soda, or low fat milk.  Check your blood glucose level 15 minutes after taking the glucose.  Take 15 20 grams more of glucose if the repeat blood glucose level is still 70 mg/dL or below.  Eat a meal or snack within 1 hour once blood glucose levels return to normal.    Be alert to polyuria and polydipsia which are early signs of hyperglycemia. An early awareness of hyperglycemia allows for prompt treatment. Treat hyperglycemia as directed by your caregiver.  Engage in at least 150 minutes of moderate-intensity physical activity a week, spread over at least 3 days of the week or as directed by your caregiver. In addition, you should engage in resistance exercise at least 2 times a week or as directed by your caregiver.  Adjust your medicine and food intake as needed if you start a new exercise or sport.  Follow your sick day plan at any time you  are unable to eat or drink as usual.  Avoid tobacco use.  Limit alcohol intake to no more than 1 drink per day for nonpregnant women and 2 drinks per day for men. You should drink alcohol only when you are also eating food. Talk with your caregiver whether alcohol is safe for you. Tell your caregiver if you drink alcohol several times a week.  Follow up with your caregiver regularly.  Schedule an eye exam soon after the diagnosis of type 2 diabetes and then annually.  Perform daily skin and foot care. Examine your skin and feet daily for cuts, bruises, redness, nail problems, bleeding, blisters, or sores. A foot exam by a caregiver should be done annually.  Brush your teeth and gums at least twice a day and floss at least once a day. Follow up with your dentist regularly.  Share your diabetes management plan with your workplace or school.  Stay up-to-date with immunizations.  Learn to manage stress.  Obtain ongoing diabetes education and support as needed.  Participate in, or seek rehabilitation as needed to maintain or improve independence and quality of life. Request a physical or occupational therapy referral if you are having foot or hand numbness or difficulties with grooming,   dressing, eating, or physical activity. SEEK MEDICAL CARE IF:   You are unable to eat food or drink fluids for more than 6 hours.  You have nausea and vomiting for more than 6 hours.  Your blood glucose level is over 240 mg/dL.  There is a change in mental status.  You develop an additional serious illness.  You have diarrhea for more than 6 hours.  You have been sick or have had a fever for a couple of days and are not getting better.  You have pain during any physical activity.  SEEK IMMEDIATE MEDICAL CARE IF:  You have difficulty breathing.  You have moderate to large ketone levels. MAKE SURE YOU:  Understand these instructions.  Will watch your condition.  Will get help right away if  you are not doing well or get worse. Document Released: 03/22/2005 Document Revised: 12/15/2011 Document Reviewed: 10/19/2011 [employer] Patient Information 2014 [employer].  

## 2012-12-18 NOTE — Assessment & Plan Note (Signed)
I will recheck his A1C and will advise further I also encouraged him to seek ENDO f/up and diabetic education

## 2012-12-18 NOTE — Assessment & Plan Note (Signed)
Bystolic stopped due to low heart rate BP is not well controlled, will start losartan to protect the kidneys

## 2012-12-19 ENCOUNTER — Encounter: Payer: Self-pay | Admitting: Internal Medicine

## 2012-12-19 MED ORDER — EZETIMIBE 10 MG PO TABS: 10.0000 mg | ORAL_TABLET | Freq: Every day | ORAL | Status: DC

## 2012-12-19 NOTE — Addendum Note (Signed)
Addended by: Etta Grandchild on: 12/19/2012 07:42 AM   Modules accepted: Orders

## 2012-12-27 ENCOUNTER — Ambulatory Visit: Payer: BC Managed Care – PPO | Admitting: Endocrinology

## 2013-02-05 ENCOUNTER — Encounter: Payer: Self-pay | Admitting: Cardiology

## 2013-02-05 ENCOUNTER — Ambulatory Visit (INDEPENDENT_AMBULATORY_CARE_PROVIDER_SITE_OTHER): Payer: BC Managed Care – PPO | Admitting: Cardiology

## 2013-02-05 VITALS — BP 150/100 | HR 84 | Ht 71.0 in | Wt 249.3 lb

## 2013-02-05 DIAGNOSIS — E785 Hyperlipidemia, unspecified: Secondary | ICD-10-CM

## 2013-02-05 DIAGNOSIS — R0789 Other chest pain: Secondary | ICD-10-CM

## 2013-02-05 DIAGNOSIS — I10 Essential (primary) hypertension: Secondary | ICD-10-CM

## 2013-02-05 DIAGNOSIS — I498 Other specified cardiac arrhythmias: Secondary | ICD-10-CM

## 2013-02-05 DIAGNOSIS — R001 Bradycardia, unspecified: Secondary | ICD-10-CM

## 2013-02-05 MED ORDER — AMLODIPINE BESYLATE 10 MG PO TABS: 10.0000 mg | ORAL_TABLET | Freq: Every day | ORAL | Status: DC

## 2013-02-05 NOTE — Progress Notes (Signed)
02/06/2013   PCP: Sanda Linger, MD   Chief Complaint  Patient presents with  . Follow-up    was supposed to have stress test done, but didn't get it because of his insurance deductible    Primary Cardiologist: Dr. Allyson Sabal  HPI:  62 year old African American male followed by Dr. Allyson Sabal for hypertension and history of normal coronary arteries by cardiac catheterization with normal LV function in 2004.  Patient does have a history of obstructive sleep apnea on BiPAP and followed by Dr. Tresa Endo, treated hypertension dyslipidemia and insulin-dependent diabetes mellitus. Pt was seen in August secondary to chest discomfort more of a burning discomfort in his chest but he is also concerned this could be cardiac. The discomfort is a pressure in his left anterior chest without radiation some shortness of breath but no nausea or diaphoresis. It had been occurring for one to 2 weeks. Activity does not seem to make it worse. Has been instructed to take Prilosec because the Zantac did not help. Patient also complained of increasing fatigue. He has had blood pressure medication adjustments recently in the fatigue started after the medication changes.  I stopped his Bystolic due to bradycardia and pt does feel better today.  Chest burning has improved, but he has not yet had stress test.  Pt was concerned about the cost.    This visit was scheduled for routine follow up.     Allergies  Allergen Reactions  . Statins     REACTION: joint pain    Current Outpatient Prescriptions  Medication Sig Dispense Refill  . aspirin 81 MG EC tablet Take 81 mg by mouth once a week.        . Blood Glucose Monitoring Suppl (RELION CONFIRM GLUCOSE MONITOR) W/DEVICE KIT 1 Device by Does not apply route once.  1 kit  0  . Colesevelam HCl 3.75 G PACK Take 1 packet by mouth daily.  90 each  3  . Dapagliflozin Propanediol (FARXIGA) 10 MG TABS Take 1 tablet by mouth daily.  90 tablet  3  . divalproex (DEPAKOTE)  500 MG DR tablet Take 1,000 mg by mouth daily.      Marland Kitchen ezetimibe (ZETIA) 10 MG tablet Take 1 tablet (10 mg total) by mouth daily.  90 tablet  3  . glucose blood (RELION ULTIMA TEST) test strip 1 each by Other route 2 (two) times daily. And lancets 2/day  100 each  12  . insulin detemir (LEVEMIR) 100 UNIT/ML injection Inject 140 Units into the skin every morning. And pen needles 1/day      . Insulin Pen Needle (BD PEN NEEDLE NANO U/F) 32G X 4 MM MISC 1 Act by Does not apply route daily.  30 each  11  . losartan (COZAAR) 100 MG tablet Take 1 tablet (100 mg total) by mouth daily.  90 tablet  3  . pravastatin (PRAVACHOL) 40 MG tablet Take 1 tablet (40 mg total) by mouth daily.  90 tablet  3  . tiZANidine (ZANAFLEX) 4 MG tablet Take 1 tablet (4 mg total) by mouth every 8 (eight) hours as needed.  65 tablet  11  . amLODipine (NORVASC) 10 MG tablet Take 1 tablet (10 mg total) by mouth daily.  180 tablet  3   No current facility-administered medications for this visit.    Past Medical History  Diagnosis Date  . Anemia   . Type II or unspecified type diabetes mellitus without mention of  complication, not stated as uncontrolled   . GERD (gastroesophageal reflux disease)   . Hyperlipidemia   . HTN (hypertension)   . Osteoarthritis   . Depression   . Adenomatous colon polyp 2011  . OSA on CPAP     Past Surgical History  Procedure Laterality Date  . Pilonidal cyst excision    . Shoulder surgery      Right  . Cardiac catheterization  2004    patent coronary arteries    NWG:[id number] colds or fevers, no weight changes Skin:no rashes or ulcers HEENT:no blurred vision, no congestion CV:see HPI PUL:see HPI GI:no diarrhea constipation or melena, no indigestion GU:no hematuria, no dysuria MS:no joint pain, no claudication Neuro:no syncope, no lightheadedness Endo:+ diabetes followed by PCP, no thyroid disease  PHYSICAL EXAM BP 150/100  Pulse 84  Ht 5\' 11"  (1.803 m)  Wt 249 lb 4.8 oz  (113.082 kg)  BMI 34.79 kg/m2 General:Pleasant affect, NAD Skin:Warm and dry, brisk capillary refill HEENT:normocephalic, sclera clear, mucus membranes moist Neck:supple, no JVD, no bruits  Heart:S1S2 RRR without murmur, gallup, rub or click Lungs:clear without rales, rhonchi, or wheezes VHQ:IONG, non tender, + BS, do not palpate liver spleen or masses Ext:no lower ext edema, 2+ pedal pulses, 2+ radial pulses Neuro:alert and oriented, MAE, follows commands, + facial symmetry   ASSESSMENT AND PLAN Essential hypertension, benign BP continues to be elevated -I increased his norvasc to 10 mg daily.   Symptomatic bradycardia, mildly symptomatic with lack of energy and fatigue Improved HR after stopping bystolic  Chest pressure Pt was to have a stress test due to chest pressure and risk factors for CAD.  He has not yet done because he does not know how much it will cost.  We gave him info on how to contact his insurance company so he would know cost.  If recurrent chest pain he should go to ER.  Hyperlipidemia LDL goal < 100 Hx of statin intolerance, taking pravastatin, LDL was elevated.  Followed by PCP   Pt will follow up with Dr. Allyson Sabal in 6 months.  He will call to reschedule his stress test.

## 2013-02-05 NOTE — Patient Instructions (Signed)
Watch your diet with salt.  Low salt is best.  Follow up with Dr. Allyson Sabal in 6 months.  Call if you have any chest pain or increasing shortness of breath.

## 2013-02-06 DIAGNOSIS — R0789 Other chest pain: Secondary | ICD-10-CM | POA: Insufficient documentation

## 2013-02-06 NOTE — Assessment & Plan Note (Signed)
Improved HR after stopping bystolic

## 2013-02-06 NOTE — Assessment & Plan Note (Signed)
Hx of statin intolerance, taking pravastatin, LDL was elevated.  Followed by PCP

## 2013-02-06 NOTE — Assessment & Plan Note (Signed)
Pt was to have a stress test due to chest pressure and risk factors for CAD.  He has not yet done because he does not know how much it will cost.  We gave him info on how to contact his insurance company so he would know cost.  If recurrent chest pain he should go to ER.

## 2013-02-06 NOTE — Assessment & Plan Note (Signed)
BP continues to be elevated -I increased his norvasc to 10 mg daily.

## 2013-03-08 ENCOUNTER — Other Ambulatory Visit: Payer: Self-pay | Admitting: *Deleted

## 2013-03-08 ENCOUNTER — Telehealth (HOSPITAL_COMMUNITY): Payer: Self-pay | Admitting: *Deleted

## 2013-03-08 DIAGNOSIS — R079 Chest pain, unspecified: Secondary | ICD-10-CM

## 2013-03-15 ENCOUNTER — Telehealth (HOSPITAL_COMMUNITY): Payer: Self-pay | Admitting: *Deleted

## 2013-04-19 ENCOUNTER — Telehealth: Payer: Self-pay | Admitting: Internal Medicine

## 2013-04-19 ENCOUNTER — Encounter: Payer: Self-pay | Admitting: Internal Medicine

## 2013-04-19 ENCOUNTER — Other Ambulatory Visit (INDEPENDENT_AMBULATORY_CARE_PROVIDER_SITE_OTHER): Payer: BC Managed Care – PPO

## 2013-04-19 ENCOUNTER — Ambulatory Visit (INDEPENDENT_AMBULATORY_CARE_PROVIDER_SITE_OTHER): Payer: BC Managed Care – PPO | Admitting: Internal Medicine

## 2013-04-19 VITALS — BP 130/90 | HR 71 | Temp 97.4°F | Resp 16 | Ht 71.0 in | Wt 254.0 lb

## 2013-04-19 DIAGNOSIS — Z0000 Encounter for general adult medical examination without abnormal findings: Secondary | ICD-10-CM

## 2013-04-19 DIAGNOSIS — N401 Enlarged prostate with lower urinary tract symptoms: Secondary | ICD-10-CM

## 2013-04-19 DIAGNOSIS — E1165 Type 2 diabetes mellitus with hyperglycemia: Principal | ICD-10-CM

## 2013-04-19 DIAGNOSIS — I10 Essential (primary) hypertension: Secondary | ICD-10-CM

## 2013-04-19 DIAGNOSIS — K219 Gastro-esophageal reflux disease without esophagitis: Secondary | ICD-10-CM

## 2013-04-19 DIAGNOSIS — IMO0001 Reserved for inherently not codable concepts without codable children: Secondary | ICD-10-CM

## 2013-04-19 DIAGNOSIS — N138 Other obstructive and reflux uropathy: Secondary | ICD-10-CM

## 2013-04-19 DIAGNOSIS — G4700 Insomnia, unspecified: Secondary | ICD-10-CM | POA: Insufficient documentation

## 2013-04-19 LAB — URINALYSIS, ROUTINE W REFLEX MICROSCOPIC
Bilirubin Urine: NEGATIVE
Hgb urine dipstick: NEGATIVE
Ketones, ur: NEGATIVE
Leukocytes, UA: NEGATIVE
Nitrite: NEGATIVE
RBC / HPF: NONE SEEN
Specific Gravity, Urine: 1.02
Total Protein, Urine: NEGATIVE
Urine Glucose: NEGATIVE
Urobilinogen, UA: 0.2
WBC, UA: NONE SEEN
pH: 7 (ref 5.0–8.0)

## 2013-04-19 LAB — COMPREHENSIVE METABOLIC PANEL WITH GFR
ALT: 28 U/L (ref 0–53)
AST: 20 U/L (ref 0–37)
Albumin: 3.8 g/dL (ref 3.5–5.2)
Alkaline Phosphatase: 111 U/L (ref 39–117)
BUN: 7 mg/dL (ref 6–23)
CO2: 31 meq/L (ref 19–32)
Calcium: 8.8 mg/dL (ref 8.4–10.5)
Chloride: 102 meq/L (ref 96–112)
Creatinine, Ser: 0.8 mg/dL (ref 0.4–1.5)
GFR: 121.97 mL/min
Glucose, Bld: 146 mg/dL — ABNORMAL HIGH (ref 70–99)
Potassium: 3.8 meq/L (ref 3.5–5.1)
Sodium: 139 meq/L (ref 135–145)
Total Bilirubin: 0.7 mg/dL (ref 0.3–1.2)
Total Protein: 7 g/dL (ref 6.0–8.3)

## 2013-04-19 LAB — HEMOGLOBIN A1C: HEMOGLOBIN A1C: 8.3 % — AB (ref 4.6–6.5)

## 2013-04-19 LAB — PSA: PSA: 3.53 ng/mL (ref 0.10–4.00)

## 2013-04-19 LAB — FECAL OCCULT BLOOD, GUAIAC: FECAL OCCULT BLD: NEGATIVE

## 2013-04-19 MED ORDER — INSULIN DETEMIR 100 UNIT/ML ~~LOC~~ SOLN: 140.0000 [IU] | SUBCUTANEOUS | Status: DC

## 2013-04-19 MED ORDER — OMEPRAZOLE 20 MG PO CPDR: 20.0000 mg | DELAYED_RELEASE_CAPSULE | Freq: Every day | ORAL | Status: DC

## 2013-04-19 MED ORDER — DOXEPIN HCL 6 MG PO TABS: 1.0000 | ORAL_TABLET | Freq: Every evening | ORAL | Status: DC | PRN

## 2013-04-19 NOTE — Patient Instructions (Signed)
Health Maintenance, Males A healthy lifestyle and preventative care can promote health and wellness.  Maintain regular health, dental, and eye exams.  Eat a healthy diet. Foods like vegetables, fruits, whole grains, low-fat dairy products, and lean protein foods contain the nutrients you need and are low in calories. Decrease your intake of foods high in solid fats, added sugars, and salt. Get information about a proper diet from your health care provider, if necessary.  Regular physical exercise is one of the most important things you can do for your health. Most adults should get at least 150 minutes of moderate-intensity exercise (any activity that increases your heart rate and causes you to sweat) each week. In addition, most adults need muscle-strengthening exercises on 2 or more days a week.   Maintain a healthy weight. The body mass index (BMI) is a screening tool to identify possible weight problems. It provides an estimate of body fat based on height and weight. Your health care provider can find your BMI and can help you achieve or maintain a healthy weight. For males 20 years and older:  A BMI below 18.5 is considered underweight.  A BMI of 18.5 to 24.9 is normal.  A BMI of 25 to 29.9 is considered overweight.  A BMI of 30 and above is considered obese.  Maintain normal blood lipids and cholesterol by exercising and minimizing your intake of saturated fat. Eat a balanced diet with plenty of fruits and vegetables. Blood tests for lipids and cholesterol should begin at age 20 and be repeated every 5 years. If your lipid or cholesterol levels are high, you are over 50, or you are at high risk for heart disease, you may need your cholesterol levels checked more frequently.Ongoing high lipid and cholesterol levels should be treated with medicines, if diet and exercise are not working.  If you smoke, find out from your health care provider how to quit. If you do not use tobacco, do not  start.  Lung cancer screening is recommended for adults aged 55 80 years who are at high risk for developing lung cancer because of a history of smoking. A yearly low-dose CT scan of the lungs is recommended for people who have at least a 30-pack-year history of smoking and are a current smoker or have quit within the past 15 years. A pack year of smoking is smoking an average of 1 pack of cigarettes a day for 1 year (for example, a 30-pack-year history of smoking could mean smoking 1 pack a day for 30 years or 2 packs a day for 15 years). Yearly screening should continue until the smoker has stopped smoking for at least 15 years. Yearly screening should be stopped for people who develop a health problem that would prevent them from having lung cancer treatment.  If you choose to drink alcohol, do not have more than 2 drinks per day. One drink is considered to be 12 oz (360 mL) of beer, 5 oz (150 mL) of wine, or 1.5 oz (45 mL) of liquor.  Avoid use of street drugs. Do not share needles with anyone. Ask for help if you need support or instructions about stopping the use of drugs.  High blood pressure causes heart disease and increases the risk of stroke. Blood pressure should be checked at least every 1 2 years. Ongoing high blood pressure should be treated with medicines if weight loss and exercise are not effective.  If you are 45 64 years old, ask your health   care provider if you should take aspirin to prevent heart disease.  Diabetes screening involves taking a blood sample to check your fasting blood sugar level. This should be done once every 3 years after age 45, if you are at a normal weight and without risk factors for diabetes. Testing should be considered at a younger age or be carried out more frequently if you are overweight and have at least 1 risk factor for diabetes.  Colorectal cancer can be detected and often prevented. Most routine colorectal cancer screening begins at the age of 50  and continues through age 75. However, your health care provider may recommend screening at an earlier age if you have risk factors for colon cancer. On a yearly basis, your health care provider may provide home test kits to check for hidden blood in the stool. A small camera at the end of a tube may be used to directly examine the colon (sigmoidoscopy or colonoscopy) to detect the earliest forms of colorectal cancer. Talk to your health care provider about this at age 50, when routine screening begins. A direct exam of the colon should be repeated every 5 10 years through age 75, unless early forms of pre-cancerous polyps or small growths are found.  People who are at an increased risk for hepatitis B should be screened for this virus. You are considered at high risk for hepatitis B if:  You were born in a country where hepatitis B occurs often. Talk with your health care provider about which countries are considered high-risk.  Your parents were born in a high-risk country and you have not received a shot to protect against hepatitis B (hepatitis B vaccine).  You have HIV or AIDS.  You use needles to inject street drugs.  You live with, or have sex with, someone who has hepatitis B.  You are a man who has sex with other men (MSM).  You get hemodialysis treatment.  You take certain medicines for conditions like cancer, organ transplantation, and autoimmune conditions.  Hepatitis C blood testing is recommended for all people born from 1945 through 1965 and any individual with known risk factors for hepatitis C.  Healthy men should no longer receive prostate-specific antigen (PSA) blood tests as part of routine cancer screening. Talk to your health care provider about prostate cancer screening.  Testicular cancer screening is not recommended for adolescents or adult males who have no symptoms. Screening includes self-exam, a health care provider exam, and other screening tests. Consult with  your health care provider about any symptoms you have or any concerns you have about testicular cancer.  Practice safe sex. Use condoms and avoid high-risk sexual practices to reduce the spread of sexually transmitted infections (STIs).  Use sunscreen. Apply sunscreen liberally and repeatedly throughout the day. You should seek shade when your shadow is shorter than you. Protect yourself by wearing long sleeves, pants, a wide-brimmed hat, and sunglasses year round, whenever you are outdoors.  Tell your health care provider of new moles or changes in moles, especially if there is a change in shape or color. Also tell your provider if a mole is larger than the size of a pencil eraser.  A one-time screening for abdominal aortic aneurysm (AAA) and surgical repair of large AAAs by ultrasound is recommended for men aged 65 75 years who are current or former smokers.  Stay current with your vaccines (immunizations). Document Released: 09/18/2007 Document Revised: 01/10/2013 Document Reviewed: 08/17/2010 [employer] Patient Information 2014 [employer]   Gastroesophageal Reflux Disease, Adult Gastroesophageal reflux disease (GERD) happens when acid from your stomach flows up into the esophagus. When acid comes in contact with the esophagus, the acid causes soreness (inflammation) in the esophagus. Over time, GERD may create small holes (ulcers) in the lining of the esophagus. CAUSES   Increased body weight. This puts pressure on the stomach, making acid rise from the stomach into the esophagus.  Smoking. This increases acid production in the stomach.  Drinking alcohol. This causes decreased pressure in the lower esophageal sphincter (valve or ring of muscle between the esophagus and stomach), allowing acid from the stomach into the esophagus.  Late evening meals and a full stomach. This increases pressure and acid production in the stomach.  A malformed lower esophageal sphincter. Sometimes, no  cause is found. SYMPTOMS   Burning pain in the lower part of the mid-chest behind the breastbone and in the mid-stomach area. This may occur twice a week or more often.  Trouble swallowing.  Sore throat.  Dry cough.  Asthma-like symptoms including chest tightness, shortness of breath, or wheezing. DIAGNOSIS  Your caregiver may be able to diagnose GERD based on your symptoms. In some cases, X-rays and other tests may be done to check for complications or to check the condition of your stomach and esophagus. TREATMENT  Your caregiver may recommend over-the-counter or prescription medicines to help decrease acid production. Ask your caregiver before starting or adding any new medicines.  HOME CARE INSTRUCTIONS   Change the factors that you can control. Ask your caregiver for guidance concerning weight loss, quitting smoking, and alcohol consumption.  Avoid foods and drinks that make your symptoms worse, such as:  Caffeine or alcoholic drinks.  Chocolate.  Peppermint or mint flavorings.  Garlic and onions.  Spicy foods.  Citrus fruits, such as oranges, lemons, or limes.  Tomato-based foods such as sauce, chili, salsa, and pizza.  Fried and fatty foods.  Avoid lying down for the 3 hours prior to your bedtime or prior to taking a nap.  Eat small, frequent meals instead of large meals.  Wear loose-fitting clothing. Do not wear anything tight around your waist that causes pressure on your stomach.  Raise the head of your bed 6 to 8 inches with wood blocks to help you sleep. Extra pillows will not help.  Only take over-the-counter or prescription medicines for pain, discomfort, or fever as directed by your caregiver.  Do not take aspirin, ibuprofen, or other nonsteroidal anti-inflammatory drugs (NSAIDs). SEEK IMMEDIATE MEDICAL CARE IF:   You have pain in your arms, neck, jaw, teeth, or back.  Your pain increases or changes in intensity or duration.  You develop nausea,  vomiting, or sweating (diaphoresis).  You develop shortness of breath, or you faint.  Your vomit is green, yellow, black, or looks like coffee grounds or blood.  Your stool is red, bloody, or black. These symptoms could be signs of other problems, such as heart disease, gastric bleeding, or esophageal bleeding. MAKE SURE YOU:   Understand these instructions.  Will watch your condition.  Will get help right away if you are not doing well or get worse. Document Released: 12/30/2004 Document Revised: 06/14/2011 Document Reviewed: 10/09/2010 [facility] Patient Information 2014 [place], [place].

## 2013-04-19 NOTE — Telephone Encounter (Signed)
Please advise medication

## 2013-04-19 NOTE — Progress Notes (Signed)
Subjective:    Patient ID: [name], male    DOB: 05-11-1950, 63 y.o.   MRN: [id number]  Diabetes He presents for his follow-up diabetic visit. He has type 2 diabetes mellitus. His disease course has been stable. There are no hypoglycemic associated symptoms. Pertinent negatives for hypoglycemia include no confusion or nervousness/anxiousness. Pertinent negatives for diabetes include no blurred vision, no chest pain, no fatigue, no foot paresthesias, no foot ulcerations, no polydipsia, no polyphagia, no polyuria, no visual change, no weakness and no weight loss. There are no hypoglycemic complications. Symptoms are stable. There are no diabetic complications. Current diabetic treatment includes oral agent (dual therapy) and intensive insulin program. He is compliant with treatment most of the time. His weight is stable. He is following a generally unhealthy diet. When asked about meal planning, he reported none. He has not had a previous visit with a dietician. He never participates in exercise. There is no change in his home blood glucose trend. His breakfast blood glucose range is generally 130-140 mg/dl. His lunch blood glucose range is generally 140-180 mg/dl. His dinner blood glucose range is generally 180-200 mg/dl. His highest blood glucose is 140-180 mg/dl. His overall blood glucose range is 140-180 mg/dl. An ACE inhibitor/angiotensin II receptor blocker is being taken. He does not see a podiatrist.Eye exam is current.      Review of Systems  Constitutional: Negative.  Negative for fever, chills, weight loss, diaphoresis, appetite change and fatigue.  HENT: Negative.   Eyes: Negative.  Negative for blurred vision.  Respiratory: Negative.  Negative for cough, choking, chest tightness, shortness of breath, wheezing and stridor.   Cardiovascular: Negative.  Negative for chest pain, palpitations and leg swelling.  Gastrointestinal: Negative.  Negative for nausea, vomiting, abdominal  pain, diarrhea, constipation and blood in stool.  Endocrine: Negative.  Negative for polydipsia, polyphagia and polyuria.  Genitourinary: Negative.  Negative for urgency, frequency, hematuria, decreased urine volume, enuresis, difficulty urinating and testicular pain.  Musculoskeletal: Negative.   Skin: Negative.   Allergic/Immunologic: Negative.   Neurological: Negative.  Negative for weakness.  Hematological: Negative.  Negative for adenopathy. Does not bruise/bleed easily.  Psychiatric/Behavioral: Positive for sleep disturbance. Negative for suicidal ideas, hallucinations, behavioral problems, confusion, self-injury, dysphoric mood, decreased concentration and agitation. The patient is not nervous/anxious and is not hyperactive.        Objective:   Physical Exam  Vitals reviewed. Constitutional: He is oriented to person, place, and time. He appears well-developed and well-nourished. No distress.  HENT:  Head: Normocephalic and atraumatic.  Mouth/Throat: Oropharynx is clear and moist. No oropharyngeal exudate.  Eyes: Conjunctivae are normal. Right eye exhibits no discharge. Left eye exhibits no discharge. No scleral icterus.  Neck: Normal range of motion. Neck supple. No JVD present. No tracheal deviation present. No thyromegaly present.  Cardiovascular: Normal rate, regular rhythm, normal heart sounds and intact distal pulses.  Exam reveals no gallop and no friction rub.   No murmur heard. Pulmonary/Chest: Effort normal and breath sounds normal. No stridor. No respiratory distress. He has no wheezes. He has no rales. He exhibits no tenderness.  Abdominal: Soft. Bowel sounds are normal. He exhibits no distension and no mass. There is no tenderness. There is no rebound and no guarding. Hernia confirmed negative in the right inguinal area and confirmed negative in the left inguinal area.  Genitourinary: Rectum normal, prostate normal, testes normal and penis normal. Rectal exam shows no  external hemorrhoid, no internal hemorrhoid, no fissure, no mass,  no tenderness and anal tone normal. Guaiac negative stool. Prostate is not enlarged and not tender. Right testis shows no mass, no swelling and no tenderness. Right testis is descended. Left testis shows no mass, no swelling and no tenderness. Left testis is descended. Circumcised. No penile erythema or penile tenderness. No discharge found.  Musculoskeletal: Normal range of motion. He exhibits no edema and no tenderness.  Lymphadenopathy:    He has no cervical adenopathy.       Right: No inguinal adenopathy present.       Left: No inguinal adenopathy present.  Neurological: He is oriented to person, place, and time.  Skin: Skin is warm and dry. No rash noted. He is not diaphoretic. No erythema. No pallor.  Psychiatric: He has a normal mood and affect. His behavior is normal. Judgment and thought content normal.     Lab Results  Component Value Date   WBC 6.9 06/14/2012   HGB 14.3 06/14/2012   HCT 43.3 06/14/2012   PLT 217.0 06/14/2012   GLUCOSE 139* 12/18/2012   CHOL 221* 12/18/2012   TRIG 102.0 12/18/2012   HDL 41.00 12/18/2012   LDLDIRECT 168.0 12/18/2012   LDLCALC 143* 09/29/2011   ALT 26 06/14/2012   AST 23 06/14/2012   NA 138 12/18/2012   K 3.6 12/18/2012   CL 104 12/18/2012   CREATININE 0.9 12/18/2012   BUN 11 12/18/2012   CO2 29 12/18/2012   TSH 1.36 12/18/2012   PSA 2.41 09/29/2011   HGBA1C 8.7* 12/18/2012   MICROALBUR 0.7 09/29/2011       Assessment & Plan:

## 2013-04-19 NOTE — Telephone Encounter (Signed)
I sent those Rx's to his pharmacy but glipizide is not a good option for him - please ask him to see ENDO as directed

## 2013-04-19 NOTE — Telephone Encounter (Signed)
Patient is requesting sleeping meds and prilosec rx's to be called into his pharmacy at [facility].  He also states that the insulin injection he is currently on does not help him like the pill form did  - Qlipizide.  He is requesting a script for it to be called into [facility] as well.  He has three days left of insulin injection.  He also stated Dr. Ronnald Ramp wanted to know the date of his last eye exam.  He gave me feb 27th 2014 and stated that a couple copies had been sent over by [facility], DR. Atkins.  I did go into media and pulled an eye exam for last year but it was dated 06/21/2012.  If we need further clarification let me know. . . I will call the office to get them to fax the most recent copy over.  Please advice patient.  Thanks!

## 2013-04-19 NOTE — Progress Notes (Signed)
Pre-visit discussion using our clinic review tool. No additional management support is needed unless otherwise documented below in the visit note.  

## 2013-04-20 NOTE — Assessment & Plan Note (Signed)
I will check his PSA today 

## 2013-04-20 NOTE — Telephone Encounter (Signed)
Patient informed. 

## 2013-04-20 NOTE — Assessment & Plan Note (Signed)
He will try silenor for the insomnia

## 2013-04-20 NOTE — Assessment & Plan Note (Signed)
His BP is well controlled 

## 2013-04-20 NOTE — Assessment & Plan Note (Addendum)

## 2013-04-20 NOTE — Assessment & Plan Note (Signed)
His blood sugar is not very well controlled I have asked him to f/up with ENDO and ophth He will cont the current meds for now

## 2013-04-25 ENCOUNTER — Telehealth: Payer: Self-pay | Admitting: Endocrinology

## 2013-04-25 NOTE — Telephone Encounter (Signed)
Pt would like to switch from Dr. Loanne Drilling to Dr. Cruzita Lederer.

## 2013-04-25 NOTE — Telephone Encounter (Signed)
ok 

## 2013-04-25 NOTE — Telephone Encounter (Signed)
Ms. Tavis had called and stated she would like her husband to start seeing Dr. Cruzita Lederer I am aware of the office policy where a message has to be sent to both Doctors to approve of this  I am not aware how this is done   Thank You :)

## 2013-04-26 NOTE — Telephone Encounter (Signed)
When pt is up for a new appointment ok to schedule appointments with Dr. Cruzita Lederer. Thanks!

## 2013-05-03 ENCOUNTER — Telehealth: Payer: Self-pay

## 2013-05-03 DIAGNOSIS — G4700 Insomnia, unspecified: Secondary | ICD-10-CM

## 2013-05-03 MED ORDER — DOXEPIN HCL 10 MG PO CAPS: 10.0000 mg | ORAL_CAPSULE | Freq: Every day | ORAL | Status: DC

## 2013-05-03 NOTE — Telephone Encounter (Signed)
Received fax from pharmacy stating silenor is not available. Pt request that MD to change to a different medication.

## 2013-05-03 NOTE — Telephone Encounter (Signed)
changed

## 2013-05-03 NOTE — Telephone Encounter (Signed)
Pharmacy notified.

## 2013-05-08 ENCOUNTER — Encounter: Payer: Self-pay | Admitting: *Deleted

## 2013-05-09 ENCOUNTER — Encounter: Payer: Self-pay | Admitting: *Deleted

## 2013-05-30 ENCOUNTER — Telehealth: Payer: Self-pay

## 2013-05-30 NOTE — Telephone Encounter (Signed)
No ,he needs to see Dr. Cruzita Lederer

## 2013-05-30 NOTE — Telephone Encounter (Signed)
Patient stopped by filled out a walk in sheet requesting referral to see [facility]. Thanks

## 2013-06-05 ENCOUNTER — Telehealth: Payer: Self-pay | Admitting: *Deleted

## 2013-06-05 NOTE — Telephone Encounter (Signed)
Patient phoned requesting again a referral to the [facility] diabetic clinic.  States he does NOT want to see Dr. Loanne Drilling, he wants to see [facility].  Per review of previous documentation, patient got frustrated when procedure for switching providers within a group was attempted to be explained to him & hung up.  He is willing to see Dr. Letta Median, but does not want to have to see [facility].  If a comparable solution cannot be reached, patient is requesting a referral so that he can go to the diabetic clinic at [facility].  Please advise.  CB# [phone]

## 2013-06-05 NOTE — Telephone Encounter (Signed)
Both Dr Loanne Drilling and I agreed to switch to me. If he decides not to see me and go to [facility], it is not [place] who should refer him to [facility] but his PCP.

## 2013-06-07 NOTE — Telephone Encounter (Signed)
I am unsure. Please call pt.

## 2013-06-07 NOTE — Telephone Encounter (Signed)
Have you contacted this patient yet?  I was going to close encounter, but wanted to make sure.

## 2013-06-21 ENCOUNTER — Encounter: Payer: Self-pay | Admitting: Internal Medicine

## 2013-06-21 ENCOUNTER — Ambulatory Visit (INDEPENDENT_AMBULATORY_CARE_PROVIDER_SITE_OTHER): Payer: BC Managed Care – PPO | Admitting: Internal Medicine

## 2013-06-21 VITALS — BP 138/90 | HR 79 | Temp 98.4°F | Ht 71.0 in | Wt 252.0 lb

## 2013-06-21 DIAGNOSIS — E1165 Type 2 diabetes mellitus with hyperglycemia: Principal | ICD-10-CM

## 2013-06-21 DIAGNOSIS — IMO0001 Reserved for inherently not codable concepts without codable children: Secondary | ICD-10-CM

## 2013-06-21 MED ORDER — METFORMIN HCL 500 MG PO TABS: 1000.0000 mg | ORAL_TABLET | Freq: Two times a day (BID) | ORAL | Status: DC

## 2013-06-21 MED ORDER — GLIPIZIDE ER 10 MG PO TB24: 10.0000 mg | ORAL_TABLET | Freq: Every day | ORAL | Status: DC

## 2013-06-21 MED ORDER — INSULIN DETEMIR 100 UNIT/ML ~~LOC~~ SOLN: 70.0000 [IU] | SUBCUTANEOUS | Status: DC

## 2013-06-21 NOTE — Progress Notes (Signed)
Patient ID: [name], male   DOB: 1950/08/16, 63 y.o.   MRN: [id number]  HPI: [name] is a 63 y.o.-year-old male, referred by his PCP, Dr. Ronnald Ramp, for management of DM2, insulin-dependent, uncontrolled, with complications (+ ED).  Patient has been diagnosed with diabetes in 2007; he started insulin in 2014. Last hemoglobin A1c was: Lab Results  Component Value Date   HGBA1C 8.3* 04/19/2013   HGBA1C 8.7* 12/18/2012   HGBA1C 8.6* 09/26/2012   Pt is on a regimen of: - Levemir 140 units in am - Welchol 3.7g at bedtime a day - started taking it last month - constipation resolved - Stopped Farxiga 10 mg b/c frequent urination and urgency - 2-3 mo ago He was on Glipizide in the past. He was on Metformin in the past >> no N/V.   Pt checks his sugars 1-2x a day and they are: - am: 97-360 (most 200s) - 2h after b'fast: 150s - before lunch: n/c - 2h after lunch: n/c - before dinner: n/c - 2h after dinner: n/c - bedtime: n/c - nighttime: n/c No lows. Lowest sugar was 97; ? has hypoglycemia awareness  Highest sugar was 360.  - no CKD, last BUN/creatinine:  Lab Results  Component Value Date   BUN 7 04/19/2013   CREATININE 0.8 04/19/2013  On Losartan. - last set of lipids: Lab Results  Component Value Date   CHOL 221* 12/18/2012   HDL 41.00 12/18/2012   LDLCALC 143* 09/29/2011   LDLDIRECT 168.0 12/18/2012   TRIG 102.0 12/18/2012   CHOLHDL 5 12/18/2012  On Pravastatin. - last eye exam was in 06/2012. No DR. Scheduled for a new eye exam. - no numbness and tingling in his feet.  Pt has FH of DM in mother and her family.  ROS: Constitutional: no weight gain/loss, + fatigue, + increased appetite, no subjective hyperthermia, + poor sleep, + increased urination Eyes: no blurry vision, no xerophthalmia ENT: no sore throat, no nodules palpated in throat, no dysphagia/odynophagia, no hoarseness, + decreased hearing, + tinnitus Cardiovascular: no CP/SOB/palpitations/leg  swelling Respiratory: no cough/SOB Gastrointestinal: no N/V/D/C, + heartburn  Musculoskeletal: no muscle/joint aches Skin: no rashes Neurological: no tremors/numbness/tingling/dizziness Psychiatric: no depression/anxiety  Past Medical History  Diagnosis Date  . Anemia   . Type II or unspecified type diabetes mellitus without mention of complication, not stated as uncontrolled   . GERD (gastroesophageal reflux disease)   . Hyperlipidemia   . HTN (hypertension)   . Osteoarthritis   . Depression   . Adenomatous colon polyp 2011  . OSA on CPAP    Past Surgical History  Procedure Laterality Date  . Pilonidal cyst excision    . Shoulder surgery      Right  . Cardiac catheterization  2004    patent coronary arteries   History   Social History  . Marital Status: Married    Spouse Name: N/A    Number of Children: 4  . Years of Education: N/A   Occupational History  . Retired Unemployed   Social History Main Topics  . Smoking status: Former Smoker -- 10 years    Quit date: 04/06/1983  . Smokeless tobacco: Not on file  . Alcohol Use: No     Comment: Occasional  . Drug Use: No  . Sexual Activity: Yes   Other Topics Concern  . Not on file   Social History Narrative   Regular Exercise -  NO   Current Outpatient Prescriptions on File Prior to  Visit  Medication Sig Dispense Refill  . amLODipine (NORVASC) 10 MG tablet Take 1 tablet (10 mg total) by mouth daily.  180 tablet  3  . aspirin 81 MG EC tablet Take 81 mg by mouth once a week.        . Blood Glucose Monitoring Suppl (RELION CONFIRM GLUCOSE MONITOR) W/DEVICE KIT 1 Device by Does not apply route once.  1 kit  0  . Colesevelam HCl 3.75 G PACK Take 1 packet by mouth daily.  90 each  3  . doxepin (SINEQUAN) 10 MG capsule Take 1 capsule (10 mg total) by mouth at bedtime.  90 capsule  3  . glucose blood (RELION ULTIMA TEST) test strip 1 each by Other route 2 (two) times daily. And lancets 2/day  100 each  12  . Insulin  Pen Needle (BD PEN NEEDLE NANO U/F) 32G X 4 MM MISC 1 Act by Does not apply route daily.  30 each  11  . losartan (COZAAR) 100 MG tablet Take 1 tablet (100 mg total) by mouth daily.  90 tablet  3  . omeprazole (PRILOSEC) 20 MG capsule Take 1 capsule (20 mg total) by mouth daily.  90 capsule  3  . tiZANidine (ZANAFLEX) 4 MG tablet Take 1 tablet (4 mg total) by mouth every 8 (eight) hours as needed.  65 tablet  11  . divalproex (DEPAKOTE) 500 MG DR tablet Take 1,000 mg by mouth daily.      Marland Kitchen ezetimibe (ZETIA) 10 MG tablet Take 1 tablet (10 mg total) by mouth daily.  90 tablet  3  . pravastatin (PRAVACHOL) 40 MG tablet Take 1 tablet (40 mg total) by mouth daily.  90 tablet  3   No current facility-administered medications on file prior to visit.   Allergies  Allergen Reactions  . Statins     REACTION: joint pain   Family History  Problem Relation Age of Onset  . Coronary artery disease Other   . Sudden death Other   . Stroke Other   . Diabetes Other   . Alcohol abuse Other   . Coronary artery disease Other     CABG  . Heart disease Mother   . Diabetes Mother   . Diabetes Sister    PE: BP 138/90  Pulse 79  Temp(Src) 98.4 F (36.9 C) (Oral)  Ht 5' 11"  (1.803 m)  Wt 252 lb (114.306 kg)  BMI 35.16 kg/m2  SpO2 94% Wt Readings from Last 3 Encounters:  06/21/13 252 lb (114.306 kg)  04/19/13 254 lb (115.214 kg)  02/05/13 249 lb 4.8 oz (113.082 kg)   Constitutional: overweight, in NAD Eyes: PERRLA, EOMI, no exophthalmos ENT: moist mucous membranes, no thyromegaly, no cervical lymphadenopathy Cardiovascular: RRR, No MRG Respiratory: CTA B Gastrointestinal: abdomen soft, NT, ND, BS+ Musculoskeletal: no deformities, strength intact in all 4 Skin: moist, warm, no rashes Neurological: no tremor with outstretched hands, DTR normal in all 4  ASSESSMENT: 1. DM2, insulin-dependent, uncontrolled, with complications - ED  PLAN:  1. Patient with long-standing, uncontrolled diabetes,  on oral antidiabetic regimen + very high basal insulin dose - We discussed about options for treatment, and I suggested to:  Patient Instructions  Please decrease the Levemir to 70 units. Continue Welchol 3.75 g 1x a day with dinner. Please start Metformin 500 mg with dinner x 3 days. If you tolerate this well, add another Metformin tablet (500 mg) with breakfast x 3 days. If you tolerate this well, add another  metformin tablet with dinner (total 1000 mg) x 3 days. If you tolerate this well, add another metformin tablets with breakfast (total 1000 mg). Continue with 1000 mg of metformin twice a day with breakfast and dinner. Start Glipizide XL 10 mg daily in am.  - Strongly advised him to start checking sugars at different times of the day - check 2 times a day, rotating checks - given sugar log and advised how to fill it and to bring it at next appt  - given foot care handout and explained the principles  - given instructions for hypoglycemia management "15-15 rule"  - advised for yearly eye exams, he is up to date - Return to clinic in 1 mo with sugar log

## 2013-06-21 NOTE — Patient Instructions (Addendum)
Please decrease the Levemir to 70 units. Continue Welchol 3.75 g 1x a day with dinner. Please start Metformin 500 mg with dinner x 3 days. If you tolerate this well, add another Metformin tablet (500 mg) with breakfast x 3 days. If you tolerate this well, add another metformin tablet with dinner (total 1000 mg) x 3 days. If you tolerate this well, add another metformin tablets with breakfast (total 1000 mg). Continue with 1000 mg of metformin twice a day with breakfast and dinner. Start Glipizide XL 10 mg daily in am.  PATIENT INSTRUCTIONS FOR TYPE 2 DIABETES:  **Please join MyChart!** - see attached instructions about how to join   DIET AND EXERCISE Diet and exercise is an important part of diabetic treatment.  We recommended aerobic exercise in the form of brisk walking (working between 40-60% of maximal aerobic capacity, similar to brisk walking) for 150 minutes per week (such as 30 minutes five days per week) along with 3 times per week performing 'resistance' training (using various gauge rubber tubes with handles) 5-10 exercises involving the major muscle groups (upper body, lower body and core) performing 10-15 repetitions (or near fatigue) each exercise. Start at half the above goal but build slowly to reach the above goals. If limited by weight, joint pain, or disability, we recommend daily walking in a swimming pool with water up to waist to reduce pressure from joints while allow for adequate exercise.    BLOOD GLUCOSES Monitoring your blood glucoses is important for continued management of your diabetes. Please check your blood glucoses 2-4 times a day: fasting, before meals and at bedtime (you can rotate these measurements - e.g. one day check before the 3 meals, the next day check before 2 of the meals and before bedtime, etc.   HYPOGLYCEMIA (low blood sugar) Hypoglycemia is usually a reaction to not eating, exercising, or taking too much insulin/ other diabetes drugs.  Symptoms  include tremors, sweating, hunger, confusion, headache, etc. Treat IMMEDIATELY with 15 grams of Carbs:   4 glucose tablets    cup regular juice/soda   2 tablespoons raisins   4 teaspoons sugar   1 tablespoon honey Recheck blood glucose in 15 mins and repeat above if still symptomatic/blood glucose <100. Please contact our office at [phone] if you have questions about how to next handle your insulin.  RECOMMENDATIONS TO REDUCE YOUR RISK OF DIABETIC COMPLICATIONS: * Take your prescribed MEDICATION(S). * Follow a DIABETIC diet: Complex carbs, fiber rich foods, heart healthy fish twice weekly, (monounsaturated and polyunsaturated) fats * AVOID saturated/trans fats, high fat foods, >2,300 mg salt per day. * EXERCISE at least 5 times a week for 30 minutes or preferably daily.  * DO NOT SMOKE OR DRINK more than 1 drink a day. * Check your FEET every day. Do not wear tightfitting shoes. Contact us if you develop an ulcer * See your EYE doctor once a year or more if needed * Get a FLU shot once a year * Get a PNEUMONIA vaccine once before and once after age 58 years  GOALS:  * Your Hemoglobin A1c of <7%  * fasting sugars need to be <130 * after meals sugars need to be <180 (2h after you start eating) * Your Systolic BP should be 629 or lower  * Your Diastolic BP should be 80 or lower  * Your HDL (Good Cholesterol) should be 40 or higher  * Your LDL (Bad Cholesterol) should be 100 or lower  * Your Triglycerides should  be 150 or lower  * Your Urine microalbumin (kidney function) should be <30 * Your Body Mass Index should be 25 or lower   We will be glad to help you achieve these goals. Our telephone number is: [phone].

## 2013-06-27 ENCOUNTER — Ambulatory Visit (HOSPITAL_COMMUNITY)
Admission: RE | Admit: 2013-06-27 | Discharge: 2013-06-27 | Disposition: A | Discharge status: Home / Self Care | Payer: Medicare Other | Source: Ambulatory Visit | Attending: Cardiovascular Disease | Admitting: Cardiovascular Disease

## 2013-06-27 DIAGNOSIS — R079 Chest pain, unspecified: Secondary | ICD-10-CM | POA: Insufficient documentation

## 2013-06-27 MED ORDER — TECHNETIUM TC 99M SESTAMIBI GENERIC - CARDIOLITE
10.3000 | Freq: Once | INTRAVENOUS | Status: AC | PRN
  Administered 2013-06-27: 10 via INTRAVENOUS

## 2013-06-27 MED ORDER — TECHNETIUM TC 99M SESTAMIBI GENERIC - CARDIOLITE
31.0000 | Freq: Once | INTRAVENOUS | Status: AC | PRN
  Administered 2013-06-27: 31 via INTRAVENOUS

## 2013-06-27 NOTE — Procedures (Addendum)
[facility] [address] [place] [address] [phone]  Cardiology Nuclear Med Study  [name] is a 63 y.o. male     MRN : [id number]     DOB: 1950-06-03  Procedure Date: 06/27/2013  Nuclear Med Background Indication for Stress Test:  Evaluation for Ischemia History:  No prior cardiac or respiratory history reported. Prior cath with normal coronaries. No prior NUC study for comparrison. Cardiac Risk Factors: History of Smoking, Hypertension, IDDM Type 2, Lipids and Obesity  Symptoms:  Chest Pain, DOE, Fatigue, Light-Headedness, Palpitations and SOB   Nuclear Pre-Procedure Caffeine/Decaff Intake:  7:00pm NPO After: 5:00am   IV Site: R Hand  IV 0.9% NS with Angio Cath:  22g  Chest Size (in):  46"  IV Started by: Azucena Cecil, RN  Height: 5\' 11"  (1.803 m)  Cup Size: n/a  BMI:  Body mass index is 35.44 kg/(m^2). Weight:  254 lb (115.214 kg)   Tech Comments:  n/a    Nuclear Med Study 1 or 2 day study: 1 day  Stress Test Type:  Stress  Order Authorizing Provider:  Quay Burow, MD   Resting Radionuclide: Technetium 39m Sestamibi  Resting Radionuclide Dose: 10.3 mCi   Stress Radionuclide:  Technetium 87m Sestamibi  Stress Radionuclide Dose: 31.0 mCi           Stress Protocol Rest HR:71 Stress HR: 142  Rest BP: *181/101 Stress BP: 221/97  Exercise Time (min): 9:01 METS: 10.10          Dose of Adenosine (mg):  n/a Dose of Lexiscan: n/a mg  Dose of Atropine (mg): n/a Dose of Dobutamine: n/a mcg/kg/min (at max HR)  Stress Test Technologist: Mellody Memos, CCT Nuclear Technologist: Imagene Riches, CNMT   Rest Procedure:  Myocardial perfusion imaging was performed at rest 45 minutes following the intravenous administration of Technetium 14m Sestamibi. Stress Procedure:  The patient performed treadmill exercise using a Bruce  Protocol for 9 minutes and 1 second. The patient stopped due to shortness of breath  and fatigue. Patient denied any chest pain.  There were some significant ST-T wave changes.  Technetium 73m Sestamibi was injected IV at peak exercise and myocardial perfusion imaging was performed after a brief delay.  Transient Ischemic Dilatation (Normal <1.22):  0.90 Lung/Heart Ratio (Normal <0.45):  0.29 QGS EDV:  150 ml QGS ESV:  87 ml LV Ejection Fraction: 42%        Rest ECG: NSR - Normal EKG  Stress ECG: Significant ST abnormalities consistent with ischemia.  QPS Raw Data Images:  Normal; no motion artifact; normal heart/lung ratio. Stress Images:  Normal homogeneous uptake in all areas of the myocardium. Rest Images:  Normal homogeneous uptake in all areas of the myocardium. Subtraction (SDS):  No evidence of ischemia.  Impression Exercise Capacity:  Good exercise capacity. BP Response:  Hypertensive blood pressure response. Clinical Symptoms:  No symptoms. ECG Impression:  Significant ST abnormalities consistent with ischemia. Comparison with Prior Nuclear Study: No previous nuclear study performed  Overall Impression:  Low risk stress nuclear study Moderate Global LV dysfunction.  LV Wall Motion:  Moderate global LV dysfunction   [name], MD  06/27/2013 1:53 PM

## 2013-06-28 ENCOUNTER — Encounter (HOSPITAL_COMMUNITY): Payer: BC Managed Care – PPO

## 2013-07-03 ENCOUNTER — Telehealth: Payer: Self-pay | Admitting: Cardiovascular Disease

## 2013-07-03 NOTE — Telephone Encounter (Signed)
Forward to  [employer] RN Discuss results

## 2013-07-03 NOTE — Telephone Encounter (Signed)
Would like Stress Test results from last week he thinks.

## 2013-07-04 NOTE — Telephone Encounter (Signed)
Returning your call. °

## 2013-07-04 NOTE — Telephone Encounter (Signed)
lmom 

## 2013-07-05 ENCOUNTER — Other Ambulatory Visit: Payer: Self-pay | Admitting: *Deleted

## 2013-07-05 ENCOUNTER — Telehealth: Payer: Self-pay | Admitting: *Deleted

## 2013-07-05 ENCOUNTER — Telehealth: Payer: Self-pay | Admitting: Internal Medicine

## 2013-07-05 DIAGNOSIS — E119 Type 2 diabetes mellitus without complications: Secondary | ICD-10-CM

## 2013-07-05 DIAGNOSIS — I519 Heart disease, unspecified: Secondary | ICD-10-CM

## 2013-07-05 DIAGNOSIS — E1165 Type 2 diabetes mellitus with hyperglycemia: Principal | ICD-10-CM

## 2013-07-05 DIAGNOSIS — IMO0001 Reserved for inherently not codable concepts without codable children: Secondary | ICD-10-CM

## 2013-07-05 MED ORDER — INSULIN DETEMIR 100 UNIT/ML ~~LOC~~ SOLN: 70.0000 [IU] | SUBCUTANEOUS | Status: DC

## 2013-07-05 MED ORDER — INSULIN PEN NEEDLE 32G X 4 MM MISC: 1.0000 | Freq: Every day | Status: DC

## 2013-07-05 NOTE — Telephone Encounter (Signed)
Message copied by Chauncy Lean on Thu Jul 05, 2013  1:55 PM ------      Message from: [name]      Created: Sat Jun 30, 2013  5:31 PM       Needs 2D echo for LV fxn then ROV with me ------

## 2013-07-05 NOTE — Telephone Encounter (Signed)
PLEASE CALL IN RX FOR LEVEMIR TO [facility] PT ALSO NEEDS THE NEEDLES

## 2013-07-05 NOTE — Telephone Encounter (Signed)
apoke with patient and results given.

## 2013-07-09 ENCOUNTER — Other Ambulatory Visit: Payer: Self-pay

## 2013-07-09 DIAGNOSIS — E1165 Type 2 diabetes mellitus with hyperglycemia: Principal | ICD-10-CM

## 2013-07-09 DIAGNOSIS — IMO0001 Reserved for inherently not codable concepts without codable children: Secondary | ICD-10-CM

## 2013-07-09 MED ORDER — INSULIN DETEMIR 100 UNIT/ML ~~LOC~~ SOLN: 70.0000 [IU] | SUBCUTANEOUS | Status: DC

## 2013-07-13 ENCOUNTER — Other Ambulatory Visit: Payer: Self-pay | Admitting: *Deleted

## 2013-07-13 MED ORDER — INSULIN DETEMIR 100 UNIT/ML FLEXPEN: PEN_INJECTOR | SUBCUTANEOUS | Status: DC

## 2013-07-13 NOTE — Telephone Encounter (Signed)
Rx refill sent in wrong. Resending correctly.

## 2013-07-16 ENCOUNTER — Ambulatory Visit (HOSPITAL_COMMUNITY)
Admission: RE | Admit: 2013-07-16 | Discharge: 2013-07-16 | Disposition: A | Discharge status: Home / Self Care | Payer: Medicare Other | Source: Ambulatory Visit | Attending: Cardiology | Admitting: Cardiology

## 2013-07-16 ENCOUNTER — Encounter: Payer: Self-pay | Admitting: *Deleted

## 2013-07-16 DIAGNOSIS — R072 Precordial pain: Secondary | ICD-10-CM | POA: Insufficient documentation

## 2013-07-16 DIAGNOSIS — I359 Nonrheumatic aortic valve disorder, unspecified: Secondary | ICD-10-CM

## 2013-07-16 DIAGNOSIS — I519 Heart disease, unspecified: Secondary | ICD-10-CM

## 2013-07-16 NOTE — Progress Notes (Signed)
2D Echo Performed 07/16/2013    Marygrace Drought, RCS

## 2013-08-03 ENCOUNTER — Ambulatory Visit (INDEPENDENT_AMBULATORY_CARE_PROVIDER_SITE_OTHER): Payer: Medicare Other | Admitting: Internal Medicine

## 2013-08-03 ENCOUNTER — Encounter: Payer: Self-pay | Admitting: Internal Medicine

## 2013-08-03 VITALS — BP 128/82 | HR 72 | Temp 97.9°F | Resp 12 | Wt 254.0 lb

## 2013-08-03 DIAGNOSIS — E1165 Type 2 diabetes mellitus with hyperglycemia: Principal | ICD-10-CM

## 2013-08-03 DIAGNOSIS — IMO0001 Reserved for inherently not codable concepts without codable children: Secondary | ICD-10-CM

## 2013-08-03 LAB — HEMOGLOBIN A1C: Hgb A1c MFr Bld: 7.4 % — ABNORMAL HIGH (ref 4.6–6.5)

## 2013-08-03 MED ORDER — GLIPIZIDE ER 10 MG PO TB24: 10.0000 mg | ORAL_TABLET | Freq: Every day | ORAL | Status: DC

## 2013-08-03 NOTE — Progress Notes (Signed)
Patient ID: MORITZ LEVER, male   DOB: 05/03/1950, 63 y.o.   MRN: [id number]  HPI: CAROLYN MANISCALCO is a 63 y.o.-year-old male, returning for f/u for DM2, dx 2007, insulin-dependent since 2014, uncontrolled, with complications (+ ED).  Last hemoglobin A1c was: Lab Results  Component Value Date   HGBA1C 8.3* 04/19/2013   HGBA1C 8.7* 12/18/2012   HGBA1C 8.6* 09/26/2012   Pt was on a regimen of: - Levemir 140 units in am - Welchol 3.7g at bedtime a day  Stopped Farxiga 10 mg b/c frequent urination and urgency  He was on Glipizide in the past. He was on Metformin in the past >> no N/V.   We changed the regimen at last visit - he is now on: - Levemir 70 units in hs - Metformin 1000 mg 2x daily - started 06/2013 - Glipizide XL 10 mg daily in am - started 06/2013 He stopped Welchol 3.75 g 1x a day with dinner.  He is feeling better on the new regimen, with more energy and less blurry vision.  Pt checks his sugars 2x a day and they are: - am: 97-360 (most 200s) >> ave 130: 107-140 - 2h after b'fast: 150s >> n/c - before lunch: n/c - 2h after lunch: n/c - before dinner: n/c - 2h after dinner: n/c >> 140-150 - bedtime: n/c - nighttime: n/c No lows. Lowest sugar was 107; ? has hypoglycemia awareness  Highest sugar was 360 >> 220 x 1 - OTW in the 200 x 3 since last visit.   - no CKD, last BUN/creatinine:  Lab Results  Component Value Date   BUN 7 04/19/2013   CREATININE 0.8 04/19/2013  On Losartan. - last set of lipids: Lab Results  Component Value Date   CHOL 221* 12/18/2012   HDL 41.00 12/18/2012   LDLCALC 143* 09/29/2011   LDLDIRECT 168.0 12/18/2012   TRIG 102.0 12/18/2012   CHOLHDL 5 12/18/2012  On Pravastatin. - last eye exam was in 06/2012. No DR. Scheduled for a new eye exam this mo. - no numbness and tingling in his feet.  I reviewed pt's medications, allergies, PMH, social hx, family hx and no changes required, except as mentioned above.  He had a stress test 2 weeks ago  >> 2D Echo (07/2013): EF LV: 55% - 60%  ROS: Constitutional: no weight gain/loss, + fatigue, + increased appetite, no subjective hyperthermia, + poor sleep, + increased urination Eyes: no blurry vision, no xerophthalmia ENT: no sore throat, no nodules palpated in throat, no dysphagia/odynophagia, no hoarseness, + decreased hearing, + tinnitus Cardiovascular: no CP/SOB/+ palpitations/+ leg swelling Respiratory: no cough/SOB Gastrointestinal: no N/V/D/C, + heartburn  Musculoskeletal: + muscle/no joint aches Skin: no rashes, + hair loss Neurological: no tremors/numbness/tingling/dizziness  PE: BP 128/82  Pulse 72  Temp(Src) 97.9 F (36.6 C) (Oral)  Resp 12  Wt 254 lb (115.214 kg)  SpO2 95% Wt Readings from Last 3 Encounters:  08/03/13 254 lb (115.214 kg)  06/27/13 254 lb (115.214 kg)  06/21/13 252 lb (114.306 kg)   Constitutional: overweight, in NAD Eyes: PERRLA, EOMI, no exophthalmos ENT: moist mucous membranes, no thyromegaly, no cervical lymphadenopathy Cardiovascular: RRR, No MRG Respiratory: CTA B Gastrointestinal: abdomen soft, NT, ND, BS+ Musculoskeletal: no deformities, strength intact in all 4 Skin: moist, warm, no rashes Neurological: no tremor with outstretched hands, DTR normal in all 4  ASSESSMENT: 1. DM2, insulin-dependent, uncontrolled, with complications - ED  PLAN:  1. Patient with long-standing, uncontrolled diabetes, on oral antidiabetic  regimen + basal insulin dose, with improved control despite halving the basal insulin dose at last visit.  - I suggested to:  Patient Instructions  Please stay off Welchol. Continue Levemir 70 units at bedtime. Continue Metformin 1000 mg 2x a day with meals. Continue Glipizide XL10 mg in am. Please stop at the lab.  Please return in 3 months with your sugar log. - continue checking sugars at different times of the day - check 1-2 times a day, rotating checks >> bring log at next visit - given new sugar logs  - has  an eye exam coming up - check A1c today - Return to clinic in 3 mo with sugar log   Office Visit on 08/03/2013  Component Date Value Ref Range Status  . Hemoglobin A1C 08/03/2013 7.4* 4.6 - 6.5 % Final   Glycemic Control Guidelines for People with Diabetes:Non Diabetic:  <6%Goal of Therapy: <7%Additional Action Suggested:  >8%    Excellent decrease in A1c!

## 2013-08-03 NOTE — Patient Instructions (Signed)
Please stay off Welchol. Continue Levemir 70 units at bedtime. Continue Metformin 1000 mg 2x a day with meals. Continue Glipizide XL10 mg in am.  Please stop at the lab.   Please return in 3 months with your sugar log.

## 2013-08-07 ENCOUNTER — Ambulatory Visit (INDEPENDENT_AMBULATORY_CARE_PROVIDER_SITE_OTHER): Payer: Medicare Other | Admitting: Cardiovascular Disease

## 2013-08-07 ENCOUNTER — Encounter: Payer: Self-pay | Admitting: Cardiovascular Disease

## 2013-08-07 VITALS — BP 156/88 | HR 67 | Ht 71.0 in | Wt 254.0 lb

## 2013-08-07 DIAGNOSIS — I10 Essential (primary) hypertension: Secondary | ICD-10-CM

## 2013-08-07 DIAGNOSIS — Z79899 Other long term (current) drug therapy: Secondary | ICD-10-CM

## 2013-08-07 DIAGNOSIS — E785 Hyperlipidemia, unspecified: Secondary | ICD-10-CM

## 2013-08-07 NOTE — Assessment & Plan Note (Signed)
On statin therapy. We will recheck a lipid liver profile. 

## 2013-08-07 NOTE — Assessment & Plan Note (Signed)
On appropriate medications. His blood pressure is moderately elevated today but he has not taken his antihypertensive medications at.

## 2013-08-07 NOTE — Progress Notes (Signed)
08/07/2013 [name]   12-11-1950  [id number]  Primary Physician Scarlette Calico, MD Primary Cardiologist: .Saundra Shelling   HPI:   The patient is a 63 year old moderately-overweight married African American male, father of 31 and 3 grandchildren, whom I last saw in the office 6 months ago. He has a history of normal coronary arteries by catheterization which I performed in 2004 with normal left ventricular function. His other problems include obstructive sleep apnea, on BiPAP and followed by Dr. Claiborne Billings, treated hypertension, dyslipidemia, and insulin-dependent diabetes. He denies chest pain or shortness of breath. He saw a [name] registered nurse practitioner back in November complaining of chest pain. A Myoview stress test performed in March showed normal perfusion with an ejection fraction of 42%. He thought 2-D echo revealed normal LV function. He is now asymptomatic.    Current Outpatient Prescriptions  Medication Sig Dispense Refill  . amLODipine (NORVASC) 10 MG tablet Take 1 tablet (10 mg total) by mouth daily.  180 tablet  3  . aspirin 81 MG EC tablet Take 81 mg by mouth once a week.        . Blood Glucose Monitoring Suppl (RELION CONFIRM GLUCOSE MONITOR) W/DEVICE KIT 1 Device by Does not apply route once.  1 kit  0  . Colesevelam HCl 3.75 G PACK Take 1 packet by mouth daily.  90 each  3  . divalproex (DEPAKOTE) 500 MG DR tablet Take 1,000 mg by mouth daily.      Marland Kitchen doxepin (SINEQUAN) 10 MG capsule Take 1 capsule (10 mg total) by mouth at bedtime.  90 capsule  3  . ezetimibe (ZETIA) 10 MG tablet Take 1 tablet (10 mg total) by mouth daily.  90 tablet  3  . glipiZIDE (GLIPIZIDE XL) 10 MG 24 hr tablet Take 1 tablet (10 mg total) by mouth daily with breakfast.  90 tablet  3  . glucose blood (RELION ULTIMA TEST) test strip 1 each by Other route 2 (two) times daily. And lancets 2/day  100 each  12  . Insulin Detemir (LEVEMIR FLEXTOUCH) 100 UNIT/ML Pen Inject 70 units (0.7) units into the skin  every morning as instructed.  10 pen  4  . Insulin Pen Needle (BD PEN NEEDLE NANO U/F) 32G X 4 MM MISC 1 each by Does not apply route daily.  100 each  2  . losartan (COZAAR) 100 MG tablet Take 1 tablet (100 mg total) by mouth daily.  90 tablet  3  . metFORMIN (GLUCOPHAGE) 500 MG tablet Take 2 tablets (1,000 mg total) by mouth 2 (two) times daily with a meal.  120 tablet  3  . omeprazole (PRILOSEC) 20 MG capsule Take 1 capsule (20 mg total) by mouth daily.  90 capsule  3  . pravastatin (PRAVACHOL) 40 MG tablet Take 1 tablet (40 mg total) by mouth daily.  90 tablet  3  . tiZANidine (ZANAFLEX) 4 MG tablet Take 1 tablet (4 mg total) by mouth every 8 (eight) hours as needed.  65 tablet  11   No current facility-administered medications for this visit.    Allergies  Allergen Reactions  . Statins     REACTION: joint pain    History   Social History  . Marital Status: Married    Spouse Name: N/A    Number of Children: 4  . Years of Education: N/A   Occupational History  . Retired Unemployed   Social History Main Topics  . Smoking status: Former  Smoker -- 10 years    Quit date: 04/06/1983  . Smokeless tobacco: Not on file  . Alcohol Use: No     Comment: Occasional  . Drug Use: No  . Sexual Activity: Yes   Other Topics Concern  . Not on file   Social History Narrative   Regular Exercise -  NO     Review of Systems: General: negative for chills, fever, night sweats or weight changes.  Cardiovascular: negative for chest pain, dyspnea on exertion, edema, orthopnea, palpitations, paroxysmal nocturnal dyspnea or shortness of breath Dermatological: negative for rash Respiratory: negative for cough or wheezing Urologic: negative for hematuria Abdominal: negative for nausea, vomiting, diarrhea, bright red blood per rectum, melena, or hematemesis Neurologic: negative for visual changes, syncope, or dizziness All other systems reviewed and are otherwise negative except as noted  above.    Blood pressure 156/88, pulse 67, height 5' 11"  (1.803 m), weight 254 lb (115.214 kg).  General appearance: alert and no distress Neck: no adenopathy, no carotid bruit, no JVD, supple, symmetrical, trachea midline and thyroid not enlarged, symmetric, no tenderness/mass/nodules Lungs: clear to auscultation bilaterally Heart: regular rate and rhythm, S1, S2 normal, no murmur, click, rub or gallop Extremities: extremities normal, atraumatic, no cyanosis or edema  EKG not performed today  ASSESSMENT AND PLAN:   Hyperlipidemia LDL goal < 100 On statin therapy. We will recheck a lipid liver profile  Essential hypertension, benign On appropriate medications. His blood pressure is moderately elevated today but he has not taken his antihypertensive medications at.      [name] MD FACP,FACC,FAHA, FSCAI 08/07/2013 2:26 PM

## 2013-08-07 NOTE — Patient Instructions (Signed)
Your physician wants you to follow-up in: 1 year with Dr Gwenlyn Found.  You will receive a reminder letter in the mail two months in advance. If you don't receive a letter, please call our office to schedule the follow-up appointment.   Your physician recommends that you return for a FASTING lipid profile: at your convenience

## 2013-08-09 LAB — HM DIABETES FOOT EXAM: HM DIABETIC FOOT EXAM: NORMAL

## 2013-08-13 ENCOUNTER — Encounter: Payer: Self-pay | Admitting: Internal Medicine

## 2013-08-13 ENCOUNTER — Telehealth: Payer: Self-pay | Admitting: Cardiovascular Disease

## 2013-08-13 NOTE — Telephone Encounter (Signed)
lmom 

## 2013-08-13 NOTE — Telephone Encounter (Signed)
Please call-have some additional questions about his echo test results.

## 2013-08-13 NOTE — Telephone Encounter (Signed)
Deferred to Brita Romp RN

## 2013-08-17 ENCOUNTER — Other Ambulatory Visit: Payer: Self-pay | Admitting: *Deleted

## 2013-08-17 LAB — HM DIABETES EYE EXAM

## 2013-08-17 MED ORDER — METFORMIN HCL 1000 MG PO TABS: 1000.0000 mg | ORAL_TABLET | Freq: Two times a day (BID) | ORAL | Status: DC

## 2013-08-17 NOTE — Telephone Encounter (Signed)
lmom 

## 2013-08-17 NOTE — Telephone Encounter (Signed)
Pt requested a script for the 1,000 mg strength of metformin. Rx sent.

## 2013-08-20 ENCOUNTER — Ambulatory Visit (INDEPENDENT_AMBULATORY_CARE_PROVIDER_SITE_OTHER): Payer: Medicare Other | Admitting: Internal Medicine

## 2013-08-20 ENCOUNTER — Ambulatory Visit (INDEPENDENT_AMBULATORY_CARE_PROVIDER_SITE_OTHER)
Admission: RE | Admit: 2013-08-20 | Discharge: 2013-08-20 | Disposition: A | Discharge status: Home / Self Care | Payer: Medicare Other | Source: Ambulatory Visit | Attending: Internal Medicine | Admitting: Internal Medicine

## 2013-08-20 ENCOUNTER — Encounter: Payer: Self-pay | Admitting: Internal Medicine

## 2013-08-20 VITALS — BP 160/90 | HR 67 | Temp 97.9°F | Resp 16 | Ht 71.0 in | Wt 253.5 lb

## 2013-08-20 DIAGNOSIS — R059 Cough, unspecified: Secondary | ICD-10-CM | POA: Insufficient documentation

## 2013-08-20 DIAGNOSIS — J189 Pneumonia, unspecified organism: Secondary | ICD-10-CM

## 2013-08-20 DIAGNOSIS — R05 Cough: Secondary | ICD-10-CM

## 2013-08-20 DIAGNOSIS — I10 Essential (primary) hypertension: Secondary | ICD-10-CM

## 2013-08-20 DIAGNOSIS — N529 Male erectile dysfunction, unspecified: Secondary | ICD-10-CM

## 2013-08-20 MED ORDER — HYDROCODONE-HOMATROPINE 5-1.5 MG/5ML PO SYRP: 5.0000 mL | ORAL_SOLUTION | Freq: Three times a day (TID) | ORAL | Status: DC | PRN

## 2013-08-20 MED ORDER — HYDROCHLOROTHIAZIDE 12.5 MG PO CAPS: 12.5000 mg | ORAL_CAPSULE | Freq: Every day | ORAL | Status: DC

## 2013-08-20 MED ORDER — CEFUROXIME AXETIL 500 MG PO TABS: 500.0000 mg | ORAL_TABLET | Freq: Two times a day (BID) | ORAL | Status: DC

## 2013-08-20 NOTE — Patient Instructions (Signed)

## 2013-08-20 NOTE — Progress Notes (Signed)
   Subjective:    Patient ID: [name], male    DOB: 03/17/1951, 63 y.o.   MRN: [id number]  Cough This is a new problem. The current episode started 1 to 4 weeks ago. The problem has been gradually worsening. The problem occurs every few hours. The cough is productive of purulent sputum. Associated symptoms include chills, a fever, shortness of breath and sweats. Pertinent negatives include no chest pain, ear congestion, ear pain, headaches, heartburn, hemoptysis, myalgias, nasal congestion, postnasal drip, rash, rhinorrhea, sore throat, weight loss or wheezing. Nothing aggravates the symptoms. He has tried OTC cough suppressant for the symptoms. The treatment provided moderate relief. There is no history of asthma, bronchiectasis, bronchitis, COPD, emphysema, environmental allergies or pneumonia.      Review of Systems  Constitutional: Positive for fever, chills and fatigue. Negative for weight loss, diaphoresis, activity change, appetite change and unexpected weight change.  HENT: Negative.  Negative for congestion, ear pain, postnasal drip, rhinorrhea, sinus pressure, sneezing, sore throat and voice change.   Eyes: Negative.   Respiratory: Positive for cough and shortness of breath. Negative for apnea, hemoptysis, choking, chest tightness and wheezing.   Cardiovascular: Negative.  Negative for chest pain and palpitations.  Gastrointestinal: Negative.  Negative for heartburn, nausea, vomiting, abdominal pain, diarrhea, constipation and blood in stool.  Endocrine: Negative.   Genitourinary: Negative.   Musculoskeletal: Negative.  Negative for myalgias.  Skin: Negative.  Negative for rash.  Allergic/Immunologic: Negative.  Negative for environmental allergies.  Neurological: Negative.  Negative for dizziness, tremors, syncope, weakness, light-headedness and headaches.  Hematological: Negative.  Negative for adenopathy. Does not bruise/bleed easily.  Psychiatric/Behavioral: Negative.         Objective:   Physical Exam  Vitals reviewed. Constitutional: He is oriented to person, place, and time. He appears well-developed and well-nourished. No distress.  HENT:  Head: Normocephalic and atraumatic.  Mouth/Throat: Oropharynx is clear and moist. No oropharyngeal exudate.  Eyes: Conjunctivae are normal. Right eye exhibits no discharge. Left eye exhibits no discharge. No scleral icterus.  Neck: Normal range of motion. Neck supple. No JVD present. No tracheal deviation present. No thyromegaly present.  Cardiovascular: Normal rate, regular rhythm, normal heart sounds and intact distal pulses.  Exam reveals no gallop and no friction rub.   No murmur heard. Pulmonary/Chest: Effort normal and breath sounds normal. No stridor. No respiratory distress. He has no wheezes. He has no rales. He exhibits no tenderness.  Abdominal: Soft. Bowel sounds are normal. He exhibits no distension and no mass. There is no tenderness. There is no rebound and no guarding.  Musculoskeletal: Normal range of motion. He exhibits no edema and no tenderness.  Lymphadenopathy:    He has no cervical adenopathy.  Neurological: He is oriented to person, place, and time.  Skin: Skin is warm and dry. No rash noted. He is not diaphoretic. No erythema. No pallor.  Psychiatric: His behavior is normal. Judgment and thought content normal.          Assessment & Plan:

## 2013-08-20 NOTE — Assessment & Plan Note (Signed)
His BP is well controlled 

## 2013-08-20 NOTE — Assessment & Plan Note (Signed)
CXR is + for an area that appears to be PNA

## 2013-08-20 NOTE — Assessment & Plan Note (Signed)
Will treat the infection with ceftin and will control the cough with hycodan 

## 2013-08-21 ENCOUNTER — Other Ambulatory Visit: Payer: Self-pay | Admitting: *Deleted

## 2013-08-21 ENCOUNTER — Telehealth: Payer: Self-pay | Admitting: Internal Medicine

## 2013-08-21 MED ORDER — INSULIN PEN NEEDLE 32G X 4 MM MISC: Status: DC

## 2013-08-21 NOTE — Telephone Encounter (Signed)
Pt could not afford previous brand.

## 2013-08-21 NOTE — Telephone Encounter (Signed)
Pt having issues with rx for needles being covered is there an alternate>?

## 2013-08-21 NOTE — Telephone Encounter (Signed)
Called and spoke with pt's wife. Sent in an rx for another brand of pen needles.

## 2013-08-28 ENCOUNTER — Ambulatory Visit: Payer: Medicare Other | Admitting: Internal Medicine

## 2013-08-28 DIAGNOSIS — Z0289 Encounter for other administrative examinations: Secondary | ICD-10-CM

## 2013-08-29 ENCOUNTER — Encounter: Payer: Self-pay | Admitting: Internal Medicine

## 2013-08-29 ENCOUNTER — Ambulatory Visit (INDEPENDENT_AMBULATORY_CARE_PROVIDER_SITE_OTHER)
Admission: RE | Admit: 2013-08-29 | Discharge: 2013-08-29 | Disposition: A | Discharge status: Home / Self Care | Payer: Medicare Other | Source: Ambulatory Visit | Attending: Internal Medicine | Admitting: Internal Medicine

## 2013-08-29 ENCOUNTER — Ambulatory Visit (INDEPENDENT_AMBULATORY_CARE_PROVIDER_SITE_OTHER): Payer: Medicare Other | Admitting: Internal Medicine

## 2013-08-29 VITALS — BP 130/84 | HR 68 | Temp 98.1°F | Resp 16 | Ht 71.0 in | Wt 258.0 lb

## 2013-08-29 DIAGNOSIS — F0390 Unspecified dementia without behavioral disturbance: Secondary | ICD-10-CM

## 2013-08-29 DIAGNOSIS — J189 Pneumonia, unspecified organism: Secondary | ICD-10-CM

## 2013-08-29 MED ORDER — MEMANTINE HCL ER 7 & 14 & 21 &28 MG PO CP24: 1.0000 | ORAL_CAPSULE | Freq: Every day | ORAL | Status: DC

## 2013-08-29 MED ORDER — MEMANTINE HCL ER 28 MG PO CP24: 1.0000 | ORAL_CAPSULE | Freq: Every day | ORAL | Status: DC

## 2013-08-29 MED ORDER — RIVASTIGMINE 4.6 MG/24HR TD PT24: 4.6000 mg | MEDICATED_PATCH | Freq: Every day | TRANSDERMAL | Status: DC

## 2013-08-29 NOTE — Patient Instructions (Signed)
Dementia Dementia is a general term for problems with brain function. A person with dementia has memory loss and a hard time with at least one other brain function such as thinking, speaking, or problem solving. Dementia can affect social functioning, how you do your job, your mood, or your personality. The changes may be hidden for a long time. The earliest forms of this disease are usually not detected by family or friends. Dementia can be:  Irreversible.  Potentially reversible.  Partially reversible.  Progressive. This means it can get worse over time. CAUSES  Irreversible dementia causes may include:  Degeneration of brain cells (Alzheimer's disease or lewy body dementia).  Multiple small strokes (vascular dementia).  Infection (chronic meningitis or Creutzfelt-Jakob disease).  Frontotemporal dementia. This affects younger people, age 40 to 70, compared to those who have Alzheimer's disease.  Dementia associated with other disorders like Parkinson's disease, Huntington's disease, or HIV-associated dementia. Potentially or partially reversible dementia causes may include:  Medicines.  Metabolic causes such as excessive alcohol intake, vitamin B12 deficiency, or thyroid disease.  Masses or pressure in the brain such as a tumor, blood clot, or hydrocephalus. SYMPTOMS  Symptoms are often hard to detect. Family members or coworkers may not notice them early in the disease process. Different people with dementia may have different symptoms. Symptoms can include:  A hard time with memory, especially recent memory. Long-term memory may not be impaired.  Asking the same question multiple times or forgetting something someone just said.  A hard time speaking your thoughts or finding certain words.  A hard time solving problems or performing familiar tasks (such as how to use a telephone).  Sudden changes in mood.  Changes in personality, especially increasing moodiness or  mistrust.  Depression.  A hard time understanding complex ideas that were never a problem in the past. DIAGNOSIS  There are no specific tests for dementia.   Your caregiver may recommend a thorough evaluation. This is because some forms of dementia can be reversible. The evaluation will likely include a physical exam and getting a detailed history from you and a family member. The history often gives the best clues and suggestions for a diagnosis.  Memory testing may be done. A detailed brain function evaluation called neuropsychologic testing may be helpful.  Lab tests and brain imaging (such as a CT scan or MRI scan) are sometimes important.  Sometimes observation and re-evaluation over time is very helpful. TREATMENT  Treatment depends on the cause.   If the problem is a vitamin deficiency, it may be helped or cured with supplements.  For dementias such as Alzheimer's disease, medicines are available to stabilize or slow the course of the disease. There are no cures for this type of dementia.  Your caregiver can help direct you to groups, organizations, and other caregivers to help with decisions in the care of you or your loved one. HOME CARE INSTRUCTIONS The care of individuals with dementia is varied and dependent upon the progression of the dementia. The following suggestions are intended for the person living with, or caring for, the person with dementia.  Create a safe environment.  Remove the locks on bathroom doors to prevent the person from accidentally locking himself or herself in.  Use childproof latches on kitchen cabinets and any place where cleaning supplies, chemicals, or alcohol are kept.  Use childproof covers in unused electrical outlets.  Install childproof devices to keep doors and windows secured.  Remove stove knobs or install safety   knobs and an automatic shut-off on the stove.  Lower the temperature on water heaters.  Label medicines and keep them  locked up.  Secure knives, lighters, matches, power tools, and guns, and keep these items out of reach.  Keep the house free from clutter. Remove rugs or anything that might contribute to a fall.  Remove objects that might break and hurt the person.  Make sure lighting is good, both inside and outside.  Install grab rails as needed.  Use a monitoring device to alert you to falls or other needs for help.  Reduce confusion.  Keep familiar objects and people around.  Use night lights or dim lights at night.  Label items or areas.  Use reminders, notes, or directions for daily activities or tasks.  Keep a simple, consistent routine for waking, meals, bathing, dressing, and bedtime.  Create a calm, quiet environment.  Place large clocks and calendars prominently.  Display emergency numbers and home address near all telephones.  Use cues to establish different times of the day. An example is to open curtains to let the natural light in during the day.   Use effective communication.  Choose simple words and short sentences.  Use a gentle, calm tone of voice.  Be careful not to interrupt.  If the person is struggling to find a word or communicate a thought, try to provide the word or thought.  Ask one question at a time. Allow the person ample time to answer questions. Repeat the question again if the person does not respond.  Reduce nighttime restlessness.  Provide a comfortable bed.  Have a consistent nighttime routine.  Ensure a regular walking or physical activity schedule. Involve the person in daily activities as much as possible.  Limit napping during the day.  Limit caffeine.  Attend social events that stimulate rather than overwhelm the senses.  Encourage good nutrition and hydration.  Reduce distractions during meal times and snacks.  Avoid foods that are too hot or too cold.  Monitor chewing and swallowing ability.  Continue with routine vision,  hearing, dental, and medical screenings.  Only give over-the-counter or prescription medicines as directed by the caregiver.  Monitor driving abilities. Do not allow the person to drive when safe driving is no longer possible.  Register with an identification program which could provide location assistance in the event of a missing person situation. SEEK MEDICAL CARE IF:   New behavioral problems start such as moodiness, aggressiveness, or seeing things that are not there (hallucinations).  Any new problem with brain function happens. This includes problems with balance, speech, or falling a lot.  Problems with swallowing develop.  Any symptoms of other illness happen. Small changes or worsening in any aspect of brain function can be a sign that the illness is getting worse. It can also be a sign of another medical illness such as infection. Seeing a caregiver right away is important. SEEK IMMEDIATE MEDICAL CARE IF:   A fever develops.  New or worsened confusion develops.  New or worsened sleepiness develops.  Staying awake becomes hard to do. Document Released: 09/15/2000 Document Revised: 06/14/2011 Document Reviewed: 08/17/2010 [employer] Patient Information 2014 [employer].  

## 2013-08-29 NOTE — Progress Notes (Signed)
Subjective:    Patient ID: [name], male    DOB: 1950-12-11, 63 y.o.   MRN: [id number]  Cough This is a recurrent problem. The current episode started 1 to 4 weeks ago. The problem has been rapidly improving. The problem occurs every few hours. The cough is non-productive. Pertinent negatives include no chest pain, chills, ear congestion, ear pain, fever, headaches, heartburn, hemoptysis, myalgias, nasal congestion, postnasal drip, rash, rhinorrhea, sore throat, shortness of breath, sweats, weight loss or wheezing. He has tried prescription cough suppressant (ceftin) for the symptoms. The treatment provided significant relief. His past medical history is significant for pneumonia. There is no history of asthma, bronchiectasis, bronchitis, COPD or emphysema.      Review of Systems  Constitutional: Negative.  Negative for fever, chills, weight loss, diaphoresis, appetite change and fatigue.  HENT: Negative.  Negative for ear pain, postnasal drip, rhinorrhea, sore throat and trouble swallowing.   Eyes: Negative.   Respiratory: Positive for cough. Negative for apnea, hemoptysis, choking, chest tightness, shortness of breath and wheezing.   Cardiovascular: Negative.  Negative for chest pain, palpitations and leg swelling.  Gastrointestinal: Negative.  Negative for heartburn, nausea, vomiting, abdominal pain, diarrhea, constipation and blood in stool.  Endocrine: Negative.   Genitourinary: Negative.   Musculoskeletal: Negative.  Negative for myalgias.  Skin: Negative.  Negative for rash.  Allergic/Immunologic: Negative.   Neurological: Negative.  Negative for headaches.  Hematological: Negative.  Negative for adenopathy. Does not bruise/bleed easily.  Psychiatric/Behavioral: Positive for confusion and decreased concentration. Negative for suicidal ideas, hallucinations, behavioral problems, sleep disturbance, self-injury, dysphoric mood and agitation. The patient is not nervous/anxious  and is not hyperactive.        Objective:   Physical Exam  Vitals reviewed. Constitutional: He is oriented to person, place, and time. He appears well-developed and well-nourished.  Non-toxic appearance. He does not have a sickly appearance. He does not appear ill. No distress.  HENT:  Head: Normocephalic and atraumatic.  Mouth/Throat: Oropharynx is clear and moist. No oropharyngeal exudate.  Eyes: Conjunctivae are normal. Right eye exhibits no discharge. Left eye exhibits no discharge. No scleral icterus.  Neck: Normal range of motion. Neck supple. No JVD present. No tracheal deviation present. No thyromegaly present.  Cardiovascular: Normal rate, regular rhythm, normal heart sounds and intact distal pulses.  Exam reveals no gallop and no friction rub.   No murmur heard. Pulmonary/Chest: Effort normal and breath sounds normal. No accessory muscle usage or stridor. Not tachypneic. No respiratory distress. He has no decreased breath sounds. He has no wheezes. He has no rhonchi. He has no rales. He exhibits no tenderness.  Abdominal: Soft. Bowel sounds are normal. He exhibits no distension and no mass. There is no tenderness. There is no rebound and no guarding.  Musculoskeletal: Normal range of motion. He exhibits no edema and no tenderness.  Lymphadenopathy:    He has no cervical adenopathy.  Neurological: He is oriented to person, place, and time.  Skin: Skin is warm and dry. No rash noted. He is not diaphoretic. No erythema. No pallor.  Psychiatric: Judgment and thought content normal. His mood appears not anxious. His affect is not angry, not blunt, not labile and not inappropriate. His speech is delayed. His speech is not rapid and/or pressured, not tangential and not slurred. He is slowed. He is not agitated, not aggressive, not hyperactive, not withdrawn, not actively hallucinating and not combative. Thought content is not paranoid and not delusional. Cognition and memory are  impaired. He  does not exhibit a depressed mood. He expresses no homicidal and no suicidal ideation. He expresses no suicidal plans and no homicidal plans. He is communicative. He exhibits abnormal recent memory and abnormal remote memory. He is inattentive.     Lab Results  Component Value Date   WBC 6.9 06/14/2012   HGB 14.3 06/14/2012   HCT 43.3 06/14/2012   PLT 217.0 06/14/2012   GLUCOSE 146* 04/19/2013   CHOL 221* 12/18/2012   TRIG 102.0 12/18/2012   HDL 41.00 12/18/2012   LDLDIRECT 168.0 12/18/2012   LDLCALC 143* 09/29/2011   ALT 28 04/19/2013   AST 20 04/19/2013   NA 139 04/19/2013   K 3.8 04/19/2013   CL 102 04/19/2013   CREATININE 0.8 04/19/2013   BUN 7 04/19/2013   CO2 31 04/19/2013   TSH 1.36 12/18/2012   PSA 3.53 04/19/2013   HGBA1C 7.4* 08/03/2013   MICROALBUR 0.7 09/29/2011       Assessment & Plan:

## 2013-08-29 NOTE — Progress Notes (Signed)
Pre visit review using our clinic review tool, if applicable. No additional management support is needed unless otherwise documented below in the visit note. 

## 2013-08-30 NOTE — Assessment & Plan Note (Signed)
His CXR is normal now This has resolved

## 2013-08-30 NOTE — Assessment & Plan Note (Signed)
He wants to try to improve his memory so I have asked him to start namenda and exelon patch I gave him a titration pak for the namenda and showed him how to take and how to use the exelon patch

## 2013-09-05 ENCOUNTER — Encounter (HOSPITAL_COMMUNITY): Payer: Self-pay | Admitting: Emergency Medicine

## 2013-09-05 ENCOUNTER — Emergency Department (INDEPENDENT_AMBULATORY_CARE_PROVIDER_SITE_OTHER): Payer: Medicare Other

## 2013-09-05 ENCOUNTER — Emergency Department (HOSPITAL_COMMUNITY)
Admission: EM | Admit: 2013-09-05 | Discharge: 2013-09-05 | Disposition: A | Discharge status: Home / Self Care | Payer: Medicare Other | Source: Home / Self Care | Attending: Emergency Medicine | Admitting: Emergency Medicine

## 2013-09-05 DIAGNOSIS — J189 Pneumonia, unspecified organism: Secondary | ICD-10-CM

## 2013-09-05 LAB — POCT I-STAT, CHEM 8
BUN: 9 mg/dL (ref 6–23)
CHLORIDE: 101 meq/L (ref 96–112)
Calcium, Ion: 1.18 mmol/L (ref 1.13–1.30)
Creatinine, Ser: 1 mg/dL (ref 0.50–1.35)
Glucose, Bld: 96 mg/dL (ref 70–99)
HEMATOCRIT: 48 % (ref 39.0–52.0)
Hemoglobin: 16.3 g/dL (ref 13.0–17.0)
Potassium: 4.1 mEq/L (ref 3.7–5.3)
SODIUM: 144 meq/L (ref 137–147)
TCO2: 30 mmol/L (ref 0–100)

## 2013-09-05 MED ORDER — CEFTRIAXONE SODIUM 1 G IJ SOLR
1.0000 g | Freq: Once | INTRAMUSCULAR | Status: AC
  Administered 2013-09-05: 1 g via INTRAMUSCULAR

## 2013-09-05 MED ORDER — CEFTRIAXONE SODIUM 1 G IJ SOLR
INTRAMUSCULAR | Status: AC
  Filled 2013-09-05: qty 10

## 2013-09-05 MED ORDER — ALBUTEROL SULFATE HFA 108 (90 BASE) MCG/ACT IN AERS: 2.0000 | INHALATION_SPRAY | Freq: Four times a day (QID) | RESPIRATORY_TRACT | Status: DC

## 2013-09-05 MED ORDER — AMOXICILLIN-POT CLAVULANATE 875-125 MG PO TABS: 1.0000 | ORAL_TABLET | Freq: Two times a day (BID) | ORAL | Status: DC

## 2013-09-05 MED ORDER — HYDROCOD POLST-CHLORPHEN POLST 10-8 MG/5ML PO LQCR: 5.0000 mL | Freq: Two times a day (BID) | ORAL | Status: DC | PRN

## 2013-09-05 MED ORDER — LEVOFLOXACIN 500 MG PO TABS: 500.0000 mg | ORAL_TABLET | Freq: Every day | ORAL | Status: DC

## 2013-09-05 NOTE — ED Notes (Signed)
Pt  Reports  He  Was  Treated  For  A  chest  Infection  sev  Weeks  Ago  By  pcp         He  Was  Given  Anti  Biotics   Got  Better  Had  Another  Chest  X  Ray  By  pcp     -   He  Reports    sev days  Ago  Cough  Came  Back  And  For  The  Most part it  Is  Non  Productive

## 2013-09-05 NOTE — Discharge Instructions (Signed)
Pneumonia, Adult °Pneumonia is an infection of the lungs.  °CAUSES °Pneumonia may be caused by bacteria or a virus. Usually, these infections are caused by breathing infectious particles into the lungs (respiratory tract). °SYMPTOMS  °· Cough. °· Fever. °· Chest pain. °· Increased rate of breathing. °· Wheezing. °· Mucus production. °DIAGNOSIS  °If you have the common symptoms of pneumonia, your caregiver will typically confirm the diagnosis with a chest X-ray. The X-ray will show an abnormality in the lung (pulmonary infiltrate) if you have pneumonia. Other tests of your blood, urine, or sputum may be done to find the specific cause of your pneumonia. Your caregiver may also do tests (blood gases or pulse oximetry) to see how well your lungs are working. °TREATMENT  °Some forms of pneumonia may be spread to other people when you cough or sneeze. You may be asked to wear a mask before and during your exam. Pneumonia that is caused by bacteria is treated with antibiotic medicine. Pneumonia that is caused by the influenza virus may be treated with an antiviral medicine. Most other viral infections must run their course. These infections will not respond to antibiotics.  °PREVENTION °A pneumococcal shot (vaccine) is available to prevent a common bacterial cause of pneumonia. This is usually suggested for: °· People over 65 years old. °· Patients on chemotherapy. °· People with chronic lung problems, such as bronchitis or emphysema. °· People with immune system problems. °If you are over 65 or have a high risk condition, you may receive the pneumococcal vaccine if you have not received it before. In some countries, a routine influenza vaccine is also recommended. This vaccine can help prevent some cases of pneumonia. You may be offered the influenza vaccine as part of your care. °If you smoke, it is time to quit. You may receive instructions on how to stop smoking. Your caregiver can provide medicines and counseling to  help you quit. °HOME CARE INSTRUCTIONS  °· Cough suppressants may be used if you are losing too much rest. However, coughing protects you by clearing your lungs. You should avoid using cough suppressants if you can. °· Your caregiver may have prescribed medicine if he or she thinks your pneumonia is caused by a bacteria or influenza. Finish your medicine even if you start to feel better. °· Your caregiver may also prescribe an expectorant. This loosens the mucus to be coughed up. °· Only take over-the-counter or prescription medicines for pain, discomfort, or fever as directed by your caregiver. °· Do not smoke. Smoking is a common cause of bronchitis and can contribute to pneumonia. If you are a smoker and continue to smoke, your cough may last several weeks after your pneumonia has cleared. °· A cold steam vaporizer or humidifier in your room or home may help loosen mucus. °· Coughing is often worse at night. Sleeping in a semi-upright position in a recliner or using a couple pillows under your head will help with this. °· Get rest as you feel it is needed. Your body will usually let you know when you need to rest. °SEEK IMMEDIATE MEDICAL CARE IF:  °· Your illness becomes worse. This is especially true if you are elderly or weakened from any other disease. °· You cannot control your cough with suppressants and are losing sleep. °· You begin coughing up blood. °· You develop pain which is getting worse or is uncontrolled with medicines. °· You have a fever. °· Any of the symptoms which initially brought you in for treatment   are getting worse rather than better. °· You develop shortness of breath or chest pain. °MAKE SURE YOU:  °· Understand these instructions. °· Will watch your condition. °· Will get help right away if you are not doing well or get worse. °Document Released: 03/22/2005 Document Revised: 06/14/2011 Document Reviewed: 06/11/2010 °[employer]® Patient Information ©2014 [employer]. °How to Use an  Inhaler °Proper inhaler technique is very important. Good technique ensures that the medicine reaches the lungs. Poor technique results in depositing the medicine on the tongue and back of the throat rather than in the airways. If you do not use the inhaler with good technique, the medicine will not help you. °STEPS TO FOLLOW IF USING AN INHALER WITHOUT AN EXTENSION TUBE °1. Remove the cap from the inhaler. °2. If you are using the inhaler for the first time, you will need to prime it. Shake the inhaler for 5 seconds and release four puffs into the air, away from your face. Ask your health care provider or pharmacist if you have questions about priming your inhaler. °3. Shake the inhaler for 5 seconds before each breath in (inhalation). °4. Position the inhaler so that the top of the canister faces up. °5. Put your index finger on the top of the medicine canister. Your thumb supports the bottom of the inhaler. °6. Open your mouth. °7. Either place the inhaler between your teeth and place your lips tightly around the mouthpiece, or hold the inhaler 1 2 inches away from your open mouth. If you are unsure of which technique to use, ask your health care provider. °8. Breathe out (exhale) normally and as completely as possible. °9. Press the canister down with your index finger to release the medicine. °10. At the same time as the canister is pressed, inhale deeply and slowly until your lungs are completely filled. This should take 4 6 seconds. Keep your tongue down. °11. Hold the medicine in your lungs for 5 10 seconds (10 seconds is best). This helps the medicine get into the small airways of your lungs. °12. Breathe out slowly, through pursed lips. Whistling is an example of pursed lips. °13. Wait at least 15 30 seconds between puffs. Continue with the above steps until you have taken the number of puffs your health care provider has ordered. Do not use the inhaler more than your health care provider tells  you. °14. Replace the cap on the inhaler. °15. Follow the directions from your health care provider or the inhaler insert for cleaning the inhaler. °STEPS TO FOLLOW IF USING AN INHALER WITH AN EXTENSION (SPACER) °1. Remove the cap from the inhaler. °2. If you are using the inhaler for the first time, you will need to prime it. Shake the inhaler for 5 seconds and release four puffs into the air, away from your face. Ask your health care provider or pharmacist if you have questions about priming your inhaler. °3. Shake the inhaler for 5 seconds before each breath in (inhalation). °4. Place the open end of the spacer onto the mouthpiece of the inhaler. °5. Position the inhaler so that the top of the canister faces up and the spacer mouthpiece faces you. °6. Put your index finger on the top of the medicine canister. Your thumb supports the bottom of the inhaler and the spacer. °7. Breathe out (exhale) normally and as completely as possible. °8. Immediately after exhaling, place the spacer between your teeth and into your mouth. Close your lips tightly around the spacer. °9.   Press the canister down with your index finger to release the medicine. 10. At the same time as the canister is pressed, inhale deeply and slowly until your lungs are completely filled. This should take 4 6 seconds. Keep your tongue down and out of the way. 11. Hold the medicine in your lungs for 5 10 seconds (10 seconds is best). This helps the medicine get into the small airways of your lungs. Exhale. 12. Repeat inhaling deeply through the spacer mouthpiece. Again hold that breath for up to 10 seconds (10 seconds is best). Exhale slowly. If it is difficult to take this second deep breath through the spacer, breathe normally several times through the spacer. Remove the spacer from your mouth. 13. Wait at least 15 30 seconds between puffs. Continue with the above steps until you have taken the number of puffs your health care provider has  ordered. Do not use the inhaler more than your health care provider tells you. 14. Remove the spacer from the inhaler, and place the cap on the inhaler. 15. Follow the directions from your health care provider or the inhaler insert for cleaning the inhaler and spacer. If you are using different kinds of inhalers, use your quick relief medicine to open the airways 10 15 minutes before using a steroid if instructed to do so by your health care provider. If you are unsure which inhalers to use and the order of using them, ask your health care provider, nurse, or respiratory therapist. If you are using a steroid inhaler, always rinse your mouth with water after your last puff, then gargle and spit out the water. Do not swallow the water. AVOID:  Inhaling before or after starting the spray of medicine. It takes practice to coordinate your breathing with triggering the spray.  Inhaling through the nose (rather than the mouth) when triggering the spray. HOW TO DETERMINE IF YOUR INHALER IS FULL OR NEARLY EMPTY You cannot know when an inhaler is empty by shaking it. A few inhalers are now being made with dose counters. Ask your health care provider for a prescription that has a dose counter if you feel you need that extra help. If your inhaler does not have a counter, ask your health care provider to help you determine the date you need to refill your inhaler. Write the refill date on a calendar or your inhaler canister. Refill your inhaler 7 10 days before it runs out. Be sure to keep an adequate supply of medicine. This includes making sure it is not expired, and that you have a spare inhaler.  SEEK MEDICAL CARE IF:   Your symptoms are only partially relieved with your inhaler.  You are having trouble using your inhaler.  You have some increase in phlegm. SEEK IMMEDIATE MEDICAL CARE IF:   You feel little or no relief with your inhalers. You are still wheezing and are feeling shortness of breath or  tightness in your chest or both.  You have dizziness, headaches, or a fast heart rate.  You have chills, fever, or night sweats.  You have a noticeable increase in phlegm production, or there is blood in the phlegm. MAKE SURE YOU:   Understand these instructions.  Will watch your condition.  Will get help right away if you are not doing well or get worse. Document Released: 03/19/2000 Document Revised: 01/10/2013 Document Reviewed: 10/19/2012 [facility] Patient Information 2014 [place], [place].

## 2013-09-05 NOTE — ED Provider Notes (Signed)
Chief Complaint   Chief Complaint  Patient presents with  . Cough    History of Present Illness   [name] is a 63 year old male who was seen by his primary care physician, Dr. Scarlette Calico on May 18 because of a cough. X-rays at that time revealed an area of either pneumonia or atelectasis in the left base. He was prescribed cefuroxime which he took for 10 days and felt better. A repeat chest x-ray on May 27 showed interval clearing of the atelectasis versus pneumonia. The patient did well up until 3-4 days ago when he again began to have a nonproductive cough and some shortness of breath. He denies any chest pain. He felt fatigued, run down, and did not have much energy. He had subjective fever and sweats. He also had nasal congestion with small amount of green drainage, headache, and sinus pressure. He denies any shortness of breath.  Review of Systems   Other than as noted above, the patient denies any of the following symptoms: Systemic:  No fevers, chills, sweats, or myalgias. Eye:  No redness or discharge. ENT:  No ear pain, headache, nasal congestion, drainage, sinus pressure, or sore throat. Neck:  No neck pain, stiffness, or swollen glands. Lungs:  No cough, sputum production, hemoptysis, wheezing, chest tightness, shortness of breath or chest pain. GI:  No abdominal pain, nausea, vomiting or diarrhea.  [name]   Past medical history, family history, social history, meds, and allergies were reviewed. He has diabetes and high blood pressure. Current meds include aspirin, Depakote, Zetia, glipizide, hydrochlorothiazide, Levemir, losartan, Namenda, metformin, omeprazole, pravastatin, Exelon, and Zanaflex.  Physical exam   Vital signs:  BP 202/94  Pulse 66  Temp(Src) 98.2 F (36.8 C) (Oral)  Resp 18  SpO2 98% General:  Alert and oriented.  In no distress.  Skin warm and dry. Eye:  No conjunctival injection or drainage. Lids were normal. ENT:  TMs and canals were normal,  without erythema or inflammation.  Nasal mucosa was clear and uncongested, without drainage.  Mucous membranes were moist.  Pharynx was clear with no exudate or drainage.  There were no oral ulcerations or lesions. Neck:  Supple, no adenopathy, tenderness or mass. Lungs:  No respiratory distress.  Lungs were clear to auscultation, without wheezes, rales or rhonchi.  Breath sounds were clear and equal bilaterally.  Heart:  Regular rhythm, without gallops, murmers or rubs. Skin:  Clear, warm, and dry, without rash or lesions.  Labs   Results for orders placed during the hospital encounter of 09/05/13  POCT I-STAT, CHEM 8      Result Value Ref Range   Sodium 144  137 - 147 mEq/L   Potassium 4.1  3.7 - 5.3 mEq/L   Chloride 101  96 - 112 mEq/L   BUN 9  6 - 23 mg/dL   Creatinine, Ser 1.00  0.50 - 1.35 mg/dL   Glucose, Bld 96  70 - 99 mg/dL   Calcium, Ion 1.18  1.13 - 1.30 mmol/L   TCO2 30  0 - 100 mmol/L   Hemoglobin 16.3  13.0 - 17.0 g/dL   HCT 48.0  39.0 - 52.0 %     Radiology   Dg Chest 2 View  09/05/2013   CLINICAL DATA:  Four week history of cough and wheezing and fever; history of diabetes and hypertension ; no history of smoking  EXAM: CHEST  2 VIEW  COMPARISON:  PA and lateral chest x-ray of Aug 29, 2013  FINDINGS: The lungs are adequately inflated. Coarse increased lung markings are present at the left lung base which are not entirely new. There is no pulmonary parenchymal mass nor evidence of a pleural effusion. The cardiac silhouette is top normal in size. The pulmonary vascularity is not engorged. There is mild tortuosity of the descending thoracic aorta. There are degenerative changes associated with the right shoulder.  IMPRESSION: Subsegmental atelectasis or early pneumonia in the left lower lobe is suspected. Followup films following therapy are recommended to assure clearing.   Electronically Signed   By: Franco Duley  [place]   On: 09/05/2013 13:56   Dg Chest 2 View  08/29/2013    CLINICAL DATA:  Cough, shortness of breath, congestion, former smoking history  EXAM: CHEST  2 VIEW  COMPARISON:  Chest x-ray of 08/20/2013  FINDINGS: No infiltrate or effusion is noted. Mediastinal contours are unchanged. The heart is mildly enlarged and stable. No acute bony abnormality is seen.  IMPRESSION: No active cardiopulmonary disease.   Electronically Signed   By: Ivar Drape M.D.   On: 08/29/2013 16:14   Dg Chest 2 View  08/20/2013   CLINICAL DATA:  Cough. Short of breath. Low grade fever. Chest pain.  EXAM: CHEST  2 VIEW  COMPARISON:  None  FINDINGS: Cardiac silhouette is normal in size and configuration. Aorta is mildly uncoiled. No mediastinal or hilar masses. No evidence of adenopathy.  There is opacity at the posterior left lung base. May reflect an area of atelectasis. A small infiltrate is possible. The lungs are otherwise clear. No pleural effusion. No pneumothorax.  The bony thorax is intact.  IMPRESSION: Small area of opacity at the posterior left lung base which may reflect atelectasis has small pneumonia.   Electronically Signed   By: Lajean Manes M.D.   On: 08/20/2013 14:27    [facility]   Given Rocephin 1 g IM.  Assessment     The encounter diagnosis was Community acquired pneumonia.  The small area of atelectasis or pneumonia which is seen only on the lateral film has appeared, disappeared, then reappeared again. I am concerned that there might be some underlying cause for this recurrence of the pneumonia. He is an ex-smoker. A CT scan may be a good idea to rule out any malignancy. In the meantime I'm going to treat with 2 antibiotics as well as an inhaler and cough medication. He is to followup with his primary care Dr. in about a week.  Plan    1.  Meds:  The following meds were prescribed:   Discharge Medication List as of 09/05/2013  2:18 PM    START taking these medications   Details  albuterol (PROVENTIL HFA;VENTOLIN HFA) 108 (90 BASE) MCG/ACT  inhaler Inhale 2 puffs into the lungs 4 (four) times daily., Starting 09/05/2013, Until Discontinued, Normal    amoxicillin-clavulanate (AUGMENTIN) 875-125 MG per tablet Take 1 tablet by mouth 2 (two) times daily., Starting 09/05/2013, Until Discontinued, Normal    chlorpheniramine-HYDROcodone (TUSSIONEX) 10-8 MG/5ML LQCR Take 5 mLs by mouth every 12 (twelve) hours as needed for cough., Starting 09/05/2013, Until Discontinued, Normal    levofloxacin (LEVAQUIN) 500 MG tablet Take 1 tablet (500 mg total) by mouth daily., Starting 09/05/2013, Until Discontinued, Normal        2.  Patient Education/Counseling:  The patient was given appropriate handouts, self care instructions, and instructed in symptomatic relief.  Instructed to get extra fluids, rest, and use a cool mist vaporizer.  3.  Follow up:  The patient was told to follow up here if no better in 3 to 4 days, or sooner if becoming worse in any way, and given some red flag symptoms such as increasing fever, difficulty breathing, chest pain, or persistent vomiting which would prompt immediate return.  Follow up here as needed.      Harden Mo, MD 09/05/13 [phone]

## 2013-09-05 NOTE — ED Notes (Signed)
Pt called in lobby x 1

## 2013-10-10 ENCOUNTER — Telehealth: Payer: Self-pay | Admitting: *Deleted

## 2013-10-10 NOTE — Telephone Encounter (Signed)
Left message on machine for patient to come in for a nurse visit for a BP check.

## 2013-11-02 ENCOUNTER — Ambulatory Visit (INDEPENDENT_AMBULATORY_CARE_PROVIDER_SITE_OTHER): Payer: Medicare Other | Admitting: Internal Medicine

## 2013-11-02 ENCOUNTER — Encounter: Payer: Self-pay | Admitting: Internal Medicine

## 2013-11-02 VITALS — BP 138/82 | HR 80 | Temp 97.7°F | Resp 12 | Wt 250.0 lb

## 2013-11-02 DIAGNOSIS — E1165 Type 2 diabetes mellitus with hyperglycemia: Principal | ICD-10-CM

## 2013-11-02 DIAGNOSIS — IMO0001 Reserved for inherently not codable concepts without codable children: Secondary | ICD-10-CM

## 2013-11-02 NOTE — Patient Instructions (Addendum)
Please increase Metformin to 1000 mg 2x a day. Please continue Glipizide XL 10 mg daily in am.   Please come back for labs on Monday.  Please return in 1 month with your sugar log.

## 2013-11-02 NOTE — Progress Notes (Signed)
Patient ID: [name], male   DOB: 1950/08/08, 63 y.o.   MRN: [id number]  HPI: [name] is a 63 y.o.-year-old male, returning for f/u for DM2, dx 2007, insulin-dependent since 2014, uncontrolled, with complications (+ ED). Last visit 4 mo ago.  Last hemoglobin A1c was: Lab Results  Component Value Date   HGBA1C 7.4* 08/03/2013   HGBA1C 8.3* 04/19/2013   HGBA1C 8.7* 12/18/2012   Pt is on a regimen of: - Metformin 1000 mg - started at last visit - he did not understand taking it 2x a day - Glipizide XL 10 mg daily in am. He was Levemir 140 units in am >> decreased to 70 units at last visit >> stopped since last visit as this was too expensive for him He stopped Welchol 3.7g at bedtime a day - at last  Stopped Farxiga 10 mg b/c frequent urination and urgency. He was on Glipizide in the past. He was on Metformin in the past >> no N/V.   Pt checks his sugars 2x a day and they are - he brings the readings from May: - am: 97-360 (most 200s) >> 79-117, 142 - 2h after b'fast: 150s >> 102, 146, 171, 191 - before lunch: n/c >> 112-168 (mostly on the low limit) - 2h after lunch: n/c >> 210 (corrected the 79 in am) - before dinner: n/c >> 143 - 2h after dinner: n/c  - bedtime: n/c - nighttime: n/c >> 134, 210 No lows. Lowest sugar was 97; ? has hypoglycemia awareness  Highest sugar was 360.  - no CKD, last BUN/creatinine:  Lab Results  Component Value Date   BUN 9 09/05/2013   CREATININE 1.00 09/05/2013  On Losartan. - last set of lipids: Lab Results  Component Value Date   CHOL 221* 12/18/2012   HDL 41.00 12/18/2012   LDLCALC 143* 09/29/2011   LDLDIRECT 168.0 12/18/2012   TRIG 102.0 12/18/2012   CHOLHDL 5 12/18/2012  On Pravastatin. - last eye exam was in 08/2013. No DR.  - no numbness and tingling in his feet.  I reviewed pt's medications, allergies, PMH, social hx, family hx and no changes required, except as mentioned above.  ROS: Constitutional: no weight gain/loss, +  fatigue, + increased appetite, no subjective hyperthermia, + poor sleep, + increased urination Eyes: + blurry vision, no xerophthalmia ENT: no sore throat, no nodules palpated in throat, no dysphagia/odynophagia, no hoarseness, + tinnitus Cardiovascular: no CP/SOB/+ palpitations/+ leg swelling Respiratory: no cough/SOB Gastrointestinal: no N/V/D/C, + heartburn  Musculoskeletal: no muscle/joint aches Skin: no rashes, + hair loss Neurological: no tremors/numbness/tingling/dizziness  PE: BP 138/82  Pulse 80  Temp(Src) 97.7 F (36.5 C) (Oral)  Resp 12  Wt 250 lb (113.399 kg)  SpO2 95% Wt Readings from Last 3 Encounters:  11/02/13 250 lb (113.399 kg)  08/29/13 258 lb (117.028 kg)  08/20/13 253 lb 8 oz (114.987 kg)   Constitutional: overweight, in NAD Eyes: PERRLA, EOMI, no exophthalmos ENT: moist mucous membranes, no thyromegaly, no cervical lymphadenopathy Cardiovascular: RRR, No MRG Respiratory: CTA B Gastrointestinal: abdomen soft, NT, ND, BS+ Musculoskeletal: no deformities, strength intact in all 4 Skin: moist, warm, no rashes Neurological: no tremor with outstretched hands, DTR normal in all 4  ASSESSMENT: 1. DM2, insulin-dependent, uncontrolled, with complications - ED  PLAN:  1. Patient with long-standing, uncontrolled diabetes, on only oral antidiabetic regimen; he stopped his 140 units of Levemir since last visit! - We discussed about options for treatment, and I suggested to:  Patient Instructions  Please increase Metformin to 1000 mg 2x a day. Please continue Glipizide XL 10 mg daily in am.   Please come back for labs on Monday.  Please return in 1 month with your sugar log.   - Strongly advised him to start checking sugars at different times of the day - check 2 times a day, rotating checks - given more sugar logs  - advised for yearly eye exams, he is up to date  - will get a HbA1c in few days - ordered - Return to clinic in 1 mo with sugar log   Lab  Results  Component Value Date   HGBA1C 7.1* 11/05/2013  A1c better!

## 2013-11-05 ENCOUNTER — Other Ambulatory Visit (INDEPENDENT_AMBULATORY_CARE_PROVIDER_SITE_OTHER): Payer: Medicare Other

## 2013-11-05 DIAGNOSIS — IMO0001 Reserved for inherently not codable concepts without codable children: Secondary | ICD-10-CM

## 2013-11-05 DIAGNOSIS — E1165 Type 2 diabetes mellitus with hyperglycemia: Principal | ICD-10-CM

## 2013-11-05 LAB — HEMOGLOBIN A1C: HEMOGLOBIN A1C: 7.1 % — AB (ref 4.6–6.5)

## 2013-11-16 ENCOUNTER — Encounter: Payer: Self-pay | Admitting: Gastroenterology

## 2013-11-21 ENCOUNTER — Ambulatory Visit: Payer: Medicare Other | Admitting: Internal Medicine

## 2013-12-13 ENCOUNTER — Encounter: Payer: Self-pay | Admitting: Internal Medicine

## 2013-12-13 ENCOUNTER — Ambulatory Visit (INDEPENDENT_AMBULATORY_CARE_PROVIDER_SITE_OTHER)
Admission: RE | Admit: 2013-12-13 | Discharge: 2013-12-13 | Disposition: A | Discharge status: Home / Self Care | Payer: Medicare Other | Source: Ambulatory Visit | Attending: Internal Medicine | Admitting: Internal Medicine

## 2013-12-13 ENCOUNTER — Other Ambulatory Visit (INDEPENDENT_AMBULATORY_CARE_PROVIDER_SITE_OTHER): Payer: Medicare Other

## 2013-12-13 ENCOUNTER — Ambulatory Visit (INDEPENDENT_AMBULATORY_CARE_PROVIDER_SITE_OTHER): Payer: Medicare Other | Admitting: Internal Medicine

## 2013-12-13 VITALS — BP 140/88 | HR 74 | Temp 97.7°F | Resp 16 | Ht 71.0 in | Wt 245.0 lb

## 2013-12-13 DIAGNOSIS — F0390 Unspecified dementia without behavioral disturbance: Secondary | ICD-10-CM

## 2013-12-13 DIAGNOSIS — N3281 Overactive bladder: Secondary | ICD-10-CM | POA: Insufficient documentation

## 2013-12-13 DIAGNOSIS — N410 Acute prostatitis: Secondary | ICD-10-CM

## 2013-12-13 DIAGNOSIS — K921 Melena: Secondary | ICD-10-CM

## 2013-12-13 DIAGNOSIS — R9389 Abnormal findings on diagnostic imaging of other specified body structures: Secondary | ICD-10-CM

## 2013-12-13 DIAGNOSIS — Z860100 Personal history of colon polyps, unspecified: Secondary | ICD-10-CM

## 2013-12-13 LAB — URINALYSIS, ROUTINE W REFLEX MICROSCOPIC
Bilirubin Urine: NEGATIVE
KETONES UR: NEGATIVE
LEUKOCYTES UA: NEGATIVE
Nitrite: NEGATIVE
SPECIFIC GRAVITY, URINE: 1.02 (ref 1.000–1.030)
Total Protein, Urine: NEGATIVE
UROBILINOGEN UA: 0.2 (ref 0.0–1.0)
Urine Glucose: NEGATIVE
pH: 5.5 (ref 5.0–8.0)

## 2013-12-13 MED ORDER — CIPROFLOXACIN HCL 500 MG PO TABS: 500.0000 mg | ORAL_TABLET | Freq: Two times a day (BID) | ORAL | Status: DC

## 2013-12-13 MED ORDER — MEMANTINE HCL-DONEPEZIL HCL ER 28-10 MG PO CP24: 1.0000 | ORAL_CAPSULE | Freq: Every day | ORAL | Status: DC

## 2013-12-13 NOTE — Progress Notes (Signed)
Subjective:    Patient ID: [name], male    DOB: [birth year], 63 y.o.   MRN: [id number]  Benign Prostatic Hypertrophy This is a chronic problem. The current episode started more than 1 year ago. The problem has been gradually worsening since onset. Irritative symptoms include frequency, nocturia and urgency. Obstructive symptoms include an intermittent stream. Obstructive symptoms do not include dribbling, incomplete emptying, a slower stream, straining or a weak stream. Associated symptoms include hematuria. Pertinent negatives include no chills, dysuria, hesitancy, nausea or vomiting. AUA score is 0-7. He is sexually active. Nothing aggravates the symptoms. Past treatments include nothing. The treatment provided no relief.      Review of Systems  Constitutional: Negative.  Negative for fever, chills, diaphoresis, appetite change and fatigue.  HENT: Negative.   Eyes: Negative.   Respiratory: Negative.  Negative for cough, choking, chest tightness, shortness of breath and stridor.   Cardiovascular: Negative.  Negative for chest pain, palpitations and leg swelling.  Gastrointestinal: Positive for blood in stool and anal bleeding. Negative for nausea, vomiting, abdominal pain, diarrhea, constipation and rectal pain.  Endocrine: Negative.   Genitourinary: Positive for urgency, frequency, hematuria and nocturia. Negative for dysuria, hesitancy and incomplete emptying.  Musculoskeletal: Negative.  Negative for arthralgias, back pain, gait problem, joint swelling, myalgias, neck pain and neck stiffness.  Skin: Negative.  Negative for rash.  Allergic/Immunologic: Negative.   Neurological: Negative.  Negative for dizziness.  Hematological: Negative.  Negative for adenopathy. Does not bruise/bleed easily.  Psychiatric/Behavioral: Positive for confusion and decreased concentration. Negative for suicidal ideas, hallucinations, behavioral problems, sleep disturbance, self-injury, dysphoric mood  and agitation.       Objective:   Physical Exam  Vitals reviewed. Constitutional: He is oriented to person, place, and time. He appears well-developed and well-nourished. No distress.  HENT:  Head: Normocephalic and atraumatic.  Mouth/Throat: Oropharynx is clear and moist. No oropharyngeal exudate.  Eyes: Conjunctivae are normal. Right eye exhibits no discharge. Left eye exhibits no discharge. No scleral icterus.  Neck: Normal range of motion. Neck supple. No JVD present. No tracheal deviation present. No thyromegaly present.  Cardiovascular: Normal rate, regular rhythm, normal heart sounds and intact distal pulses.  Exam reveals no gallop and no friction rub.   No murmur heard. Pulmonary/Chest: Effort normal and breath sounds normal. No stridor. No respiratory distress. He has no wheezes. He has no rales. He exhibits no tenderness.  Abdominal: Soft. Bowel sounds are normal. He exhibits no distension and no mass. There is no tenderness. There is no rebound and no guarding. Hernia confirmed negative in the right inguinal area and confirmed negative in the left inguinal area.  Genitourinary: Testes normal and penis normal. Rectal exam shows internal hemorrhoid. Rectal exam shows no external hemorrhoid, no fissure, no mass, no tenderness and anal tone normal. Guaiac positive stool. Prostate is enlarged and tender. Right testis shows no mass, no swelling and no tenderness. Right testis is descended. Left testis shows no mass, no swelling and no tenderness. Left testis is descended. Circumcised. No penile erythema or penile tenderness. No discharge found.  Musculoskeletal: Normal range of motion. He exhibits no edema and no tenderness.  Lymphadenopathy:    He has no cervical adenopathy.       Right: No inguinal adenopathy present.       Left: No inguinal adenopathy present.  Neurological: He is oriented to person, place, and time.  Skin: Skin is warm and dry. No rash noted. He is not diaphoretic.  No erythema. No pallor.  Psychiatric: He has a normal mood and affect. Judgment and thought content normal. His mood appears not anxious. His affect is not angry, not blunt, not labile and not inappropriate. His speech is delayed and tangential. He is slowed. He is not withdrawn and not actively hallucinating. Cognition and memory are normal. He does not exhibit a depressed mood. He is inattentive.     Lab Results  Component Value Date   WBC 6.9 06/14/2012   HGB 16.3 09/05/2013   HCT 48.0 09/05/2013   PLT 217.0 06/14/2012   GLUCOSE 96 09/05/2013   CHOL 221* 12/18/2012   TRIG 102.0 12/18/2012   HDL 41.00 12/18/2012   LDLDIRECT 168.0 12/18/2012   LDLCALC 143* 09/29/2011   ALT 28 04/19/2013   AST 20 04/19/2013   NA 144 09/05/2013   K 4.1 09/05/2013   CL 101 09/05/2013   CREATININE 1.00 09/05/2013   BUN 9 09/05/2013   CO2 31 04/19/2013   TSH 1.36 12/18/2012   PSA 3.53 04/19/2013   HGBA1C 7.1* 11/05/2013   MICROALBUR 0.7 09/29/2011       Assessment & Plan:

## 2013-12-13 NOTE — Assessment & Plan Note (Signed)
This has resolved.

## 2013-12-13 NOTE — Patient Instructions (Signed)

## 2013-12-15 ENCOUNTER — Encounter: Payer: Self-pay | Admitting: Internal Medicine

## 2013-12-15 NOTE — Assessment & Plan Note (Signed)
I have asked him to see GI to consider a repeat colonoscopy

## 2013-12-15 NOTE — Assessment & Plan Note (Signed)
He is due for a follow colonoscopy

## 2013-12-15 NOTE — Assessment & Plan Note (Signed)
He has had difficulty keeping up with his meds and has not had much benefit from just namenda I have asked him to advance to [facility] for better clinical repsonse

## 2013-12-15 NOTE — Assessment & Plan Note (Signed)
Will treat the infection with cipro

## 2013-12-18 ENCOUNTER — Ambulatory Visit (INDEPENDENT_AMBULATORY_CARE_PROVIDER_SITE_OTHER): Payer: Medicare Other | Admitting: Internal Medicine

## 2013-12-18 ENCOUNTER — Encounter: Payer: Self-pay | Admitting: Internal Medicine

## 2013-12-18 VITALS — BP 128/80 | HR 84 | Temp 97.8°F | Resp 12 | Wt 249.0 lb

## 2013-12-18 DIAGNOSIS — E1165 Type 2 diabetes mellitus with hyperglycemia: Principal | ICD-10-CM

## 2013-12-18 DIAGNOSIS — IMO0001 Reserved for inherently not codable concepts without codable children: Secondary | ICD-10-CM

## 2013-12-18 NOTE — Progress Notes (Signed)
Patient ID: [name], male   DOB: Mar 12, 1951, 63 y.o.   MRN: [id number]  HPI: [name] is a 63 y.o.-year-old male, returning for f/u for DM2, dx 2007, insulin-dependent since 2014, uncontrolled, with complications (+ ED). Last visit 1.5 mo ago.  He has a bladder infection >> tx with Ciprofloxacin >> low sugar this am: 49.   Last hemoglobin A1c was: Lab Results  Component Value Date   HGBA1C 7.1* 11/05/2013   HGBA1C 7.4* 08/03/2013   HGBA1C 8.3* 04/19/2013   Pt is on a regimen of: - Metformin 1000 mg - started at last visit - he did not understand taking it 2x a day - Glipizide XL 10 mg daily in am. He was Levemir 140 units in am >> decreased to 70 units>> stopped as this was too expensive for him He stopped Welchol 3.7g at bedtime a day - at last  Stopped Farxiga 10 mg b/c frequent urination and urgency. He was on Glipizide in the past. He was on Metformin in the past >> no N/V.   Pt checks his sugars 2x a day and they are: - am: 97-360 (most 200s) >> 79-117, 142 >> 49x1 (today), 89-130, 142 - 2h after b'fast: 150s >> 102, 146, 171, 191 >> n/c - before lunch: n/c >> 112-168 (mostly on the low limit) >> n/c - 2h after lunch: n/c >> 210 (corrected the 79 in am) >> n/c - before dinner: n/c >> 143 >> n/c - 2h after dinner: n/c  - bedtime: n/c - nighttime: n/c >> 134, 210 >> 99-190 No lows except today. Lowest sugar was 97; ? has hypoglycemia awareness  Highest sugar was 360 >> 190.  - no CKD, last BUN/creatinine:  Lab Results  Component Value Date   BUN 9 09/05/2013   CREATININE 1.00 09/05/2013  On Losartan. - last set of lipids: Lab Results  Component Value Date   CHOL 221* 12/18/2012   HDL 41.00 12/18/2012   LDLCALC 143* 09/29/2011   LDLDIRECT 168.0 12/18/2012   TRIG 102.0 12/18/2012   CHOLHDL 5 12/18/2012  On Pravastatin. - last eye exam was in 08/2013. No DR.  - no numbness and tingling in his feet.  I reviewed pt's medications, allergies, PMH, social hx, family hx  and no changes required, except as mentioned above.  ROS: Constitutional: + weight loss, + fatigue, no subjective hyperthermia, + increased urination Eyes: no blurry vision, no xerophthalmia ENT: no sore throat, no nodules palpated in throat, no dysphagia/odynophagia, no hoarseness Cardiovascular: no CP/SOB/palpitations/leg swelling Respiratory: no cough/SOB Gastrointestinal: no N/V/D/C, no heartburn  Musculoskeletal: no muscle/joint aches Skin: no rashes Neurological: no tremors/numbness/tingling/dizziness  PE: BP 128/80  Pulse 84  Temp(Src) 97.8 F (36.6 C) (Oral)  Resp 12  Wt 249 lb (112.946 kg)  SpO2 98% Wt Readings from Last 3 Encounters:  12/18/13 249 lb (112.946 kg)  12/13/13 245 lb (111.131 kg)  11/02/13 250 lb (113.399 kg)   Constitutional: overweight, in NAD Eyes: PERRLA, EOMI, no exophthalmos ENT: moist mucous membranes, no thyromegaly, no cervical lymphadenopathy Cardiovascular: RRR, No MRG Respiratory: CTA B Gastrointestinal: abdomen soft, NT, ND, BS+ Musculoskeletal: no deformities, strength intact in all 4 Skin: moist, warm, no rashes Neurological: no tremor with outstretched hands, DTR normal in all 4  ASSESSMENT: 1. DM2, insulin-dependent, uncontrolled, with complications - ED  PLAN:  1. Patient with long-standing, uncontrolled diabetes, on only oral antidiabetic regimen; he is doing great off the 140 units of insulin! - We discussed about options  for treatment, and I suggested to:  Patient Instructions  Please continue Metformin to 1000 mg 2x a day. Continue Glipizide XL 10 mg daily in am.  Please return in 3 months with your sugar log.  - he had a low from the Ciprofloxacin >> advised him to check more often and call me if the sugars drop again >> we may need to decrease the Glipizide XL to 5 mg. - continue checking sugars at different times of the day - check 2 times a day, rotating checks - advised for yearly eye exams, he is up to date  - Return  to clinic in 3 mo with sugar log

## 2013-12-18 NOTE — Patient Instructions (Signed)
Please continue Metformin to 1000 mg 2x a day. Continue Glipizide XL 10 mg daily in am.   Please call me if you continue to have low blood sugars.  Please return in 3 months with your sugar log.

## 2013-12-25 ENCOUNTER — Ambulatory Visit: Payer: Self-pay | Admitting: Podiatry

## 2014-01-07 ENCOUNTER — Telehealth: Payer: Self-pay

## 2014-01-07 MED ORDER — LOSARTAN POTASSIUM 100 MG PO TABS: 100.0000 mg | ORAL_TABLET | Freq: Every day | ORAL | Status: DC

## 2014-01-07 NOTE — Telephone Encounter (Signed)
Refill request for losartan 100 mg, qty 90 w/ 3 rf, done and sent to pharmacy

## 2014-01-15 ENCOUNTER — Ambulatory Visit: Payer: Medicare Other | Admitting: Podiatry

## 2014-01-17 ENCOUNTER — Ambulatory Visit: Payer: Medicare Other | Admitting: Internal Medicine

## 2014-01-18 ENCOUNTER — Encounter: Payer: Self-pay | Admitting: Internal Medicine

## 2014-01-24 ENCOUNTER — Encounter: Payer: Self-pay | Admitting: Internal Medicine

## 2014-01-24 ENCOUNTER — Ambulatory Visit (INDEPENDENT_AMBULATORY_CARE_PROVIDER_SITE_OTHER): Payer: Medicare Other | Admitting: Internal Medicine

## 2014-01-24 VITALS — BP 148/90 | HR 73 | Temp 98.1°F | Resp 16 | Wt 247.0 lb

## 2014-01-24 DIAGNOSIS — N410 Acute prostatitis: Secondary | ICD-10-CM

## 2014-01-24 DIAGNOSIS — F0280 Dementia in other diseases classified elsewhere without behavioral disturbance: Secondary | ICD-10-CM

## 2014-01-24 DIAGNOSIS — I10 Essential (primary) hypertension: Secondary | ICD-10-CM

## 2014-01-24 DIAGNOSIS — E118 Type 2 diabetes mellitus with unspecified complications: Secondary | ICD-10-CM

## 2014-01-24 DIAGNOSIS — N138 Other obstructive and reflux uropathy: Secondary | ICD-10-CM

## 2014-01-24 DIAGNOSIS — N401 Enlarged prostate with lower urinary tract symptoms: Secondary | ICD-10-CM

## 2014-01-24 DIAGNOSIS — F329 Major depressive disorder, single episode, unspecified: Secondary | ICD-10-CM

## 2014-01-24 DIAGNOSIS — Z23 Encounter for immunization: Secondary | ICD-10-CM

## 2014-01-24 DIAGNOSIS — F0393 Unspecified dementia, unspecified severity, with mood disturbance: Secondary | ICD-10-CM

## 2014-01-24 MED ORDER — TADALAFIL 5 MG PO TABS: 5.0000 mg | ORAL_TABLET | Freq: Every day | ORAL | Status: DC | PRN

## 2014-01-24 NOTE — Patient Instructions (Signed)

## 2014-01-24 NOTE — Progress Notes (Signed)
Subjective:    Patient ID: [name], male    DOB: 05-22-1950, 63 y.o.   MRN: [id number]  Benign Prostatic Hypertrophy This is a new problem. The current episode started more than 1 month ago. The problem is unchanged. Irritative symptoms include nocturia. Irritative symptoms do not include frequency or urgency. Obstructive symptoms include dribbling, incomplete emptying, an intermittent stream and a slower stream. Obstructive symptoms do not include straining or a weak stream. Pertinent negatives include no chills, dysuria, genital pain, hematuria, hesitancy, nausea or vomiting. AUA score is 0-7. He is sexually active. Nothing aggravates the symptoms. Past treatments include nothing. The treatment provided mild relief.      Review of Systems  Constitutional: Negative.  Negative for fever, chills, diaphoresis, appetite change and fatigue.  HENT: Negative.   Eyes: Negative.   Respiratory: Negative.  Negative for cough, choking, chest tightness, shortness of breath and stridor.   Cardiovascular: Negative.  Negative for chest pain, palpitations and leg swelling.  Gastrointestinal: Negative.  Negative for nausea, vomiting and abdominal pain.  Endocrine: Negative.   Genitourinary: Positive for incomplete emptying and nocturia. Negative for dysuria, hesitancy, urgency, frequency, hematuria, flank pain, enuresis and difficulty urinating.  Musculoskeletal: Negative.  Negative for arthralgias, back pain, joint swelling and myalgias.  Skin: Negative.  Negative for rash.  Allergic/Immunologic: Negative.   Neurological: Negative.  Negative for dizziness, tremors, weakness, light-headedness, numbness and headaches.  Hematological: Negative.  Negative for adenopathy. Does not bruise/bleed easily.  Psychiatric/Behavioral: Negative.        Objective:   Physical Exam  Vitals reviewed. Constitutional: He is oriented to person, place, and time. He appears well-developed and well-nourished. No  distress.  HENT:  Head: Normocephalic and atraumatic.  Mouth/Throat: Oropharynx is clear and moist. No oropharyngeal exudate.  Eyes: Conjunctivae are normal. Right eye exhibits no discharge. Left eye exhibits no discharge. No scleral icterus.  Neck: Normal range of motion. Neck supple. No JVD present. No tracheal deviation present. No thyromegaly present.  Cardiovascular: Normal rate, regular rhythm, normal heart sounds and intact distal pulses.  Exam reveals no gallop and no friction rub.   No murmur heard. Pulmonary/Chest: Effort normal and breath sounds normal. No stridor. No respiratory distress. He has no wheezes. He has no rales. He exhibits no tenderness.  Abdominal: Soft. Bowel sounds are normal. He exhibits no distension and no mass. There is no tenderness. There is no rebound and no guarding.  Musculoskeletal: Normal range of motion. He exhibits no edema and no tenderness.  Lymphadenopathy:    He has no cervical adenopathy.  Neurological: He is oriented to person, place, and time.  Skin: Skin is warm and dry. No rash noted. He is not diaphoretic. No erythema. No pallor.      Lab Results  Component Value Date   WBC 6.9 06/14/2012   HGB 16.3 09/05/2013   HCT 48.0 09/05/2013   PLT 217.0 06/14/2012   GLUCOSE 96 09/05/2013   CHOL 221* 12/18/2012   TRIG 102.0 12/18/2012   HDL 41.00 12/18/2012   LDLDIRECT 168.0 12/18/2012   LDLCALC 143* 09/29/2011   ALT 28 04/19/2013   AST 20 04/19/2013   NA 144 09/05/2013   K 4.1 09/05/2013   CL 101 09/05/2013   CREATININE 1.00 09/05/2013   BUN 9 09/05/2013   CO2 31 04/19/2013   TSH 1.36 12/18/2012   PSA 3.53 04/19/2013   HGBA1C 7.1* 11/05/2013   MICROALBUR 0.7 09/29/2011      Assessment & Plan:

## 2014-01-24 NOTE — Progress Notes (Signed)
Pre visit review using our clinic review tool, if applicable. No additional management support is needed unless otherwise documented below in the visit note. 

## 2014-01-27 NOTE — Assessment & Plan Note (Addendum)
Will try cialis for this 

## 2014-01-27 NOTE — Assessment & Plan Note (Signed)
The infection is resolving Will cont cipro

## 2014-01-27 NOTE — Assessment & Plan Note (Signed)
His BP is well controlled 

## 2014-01-29 ENCOUNTER — Ambulatory Visit (INDEPENDENT_AMBULATORY_CARE_PROVIDER_SITE_OTHER): Payer: Medicare Other

## 2014-01-29 ENCOUNTER — Encounter: Payer: Self-pay | Admitting: Podiatry

## 2014-01-29 ENCOUNTER — Ambulatory Visit (INDEPENDENT_AMBULATORY_CARE_PROVIDER_SITE_OTHER): Payer: Medicare Other | Admitting: Podiatry

## 2014-01-29 VITALS — BP 188/103 | HR 69 | Resp 16 | Ht 71.0 in | Wt 247.0 lb

## 2014-01-29 DIAGNOSIS — B351 Tinea unguium: Secondary | ICD-10-CM

## 2014-01-29 DIAGNOSIS — R600 Localized edema: Secondary | ICD-10-CM

## 2014-01-29 DIAGNOSIS — R609 Edema, unspecified: Secondary | ICD-10-CM

## 2014-01-29 DIAGNOSIS — M79676 Pain in unspecified toe(s): Secondary | ICD-10-CM

## 2014-01-29 DIAGNOSIS — M779 Enthesopathy, unspecified: Secondary | ICD-10-CM

## 2014-01-29 NOTE — Progress Notes (Signed)
   Subjective:    Patient ID: [name], male    DOB: 05-15-1950, 63 y.o.   MRN: [id number]  HPI Comments: "I have a spot on my foot"  Patient c/o knot and swelling dorsal right foot for few months. He doesn't remember and injury. He says his foot typically swells but used a compression brace and afterwards noticed the knot. Not painful.  Foot Pain Associated symptoms include arthralgias, myalgias and weakness.      Review of Systems  HENT: Positive for hearing loss, sinus pressure and tinnitus.   Cardiovascular: Positive for palpitations.  Gastrointestinal: Positive for blood in stool.  Endocrine: Positive for polyphagia and polyuria.  Genitourinary: Positive for frequency.  Musculoskeletal: Positive for arthralgias, back pain and myalgias.  Neurological: Positive for weakness and light-headedness.  Psychiatric/Behavioral: The patient is nervous/anxious.   All other systems reviewed and are negative.      Objective:   Physical Exam: I have reviewed his past history medications allergies surgery social history and review of systems pulses are strongly palpable bilateral. Neurologic sensorium is intact as as well as the monofilament. Deep reflexes are intact bilateral muscle strength is 5 over 5 dorsiflexion plantar flexors and inverters everters on his musculature is intact. Orthopedic evaluation to restrict all joints distal to the ankle have full range of motion without crepitation. He does have a dorsal exostosis overlying the second metatarsal intermediate cuneiform joint. Radiographically this is confirmed with dorsal spurring and what appears to be a deep osteophytic fracture. Nails are thick yellow dystrophic onychomycotic.        Assessment & Plan:  Assessment: Dorsal tarsal exostosis dorsal aspect right foot some medial dorsal cutaneous nerve involvement. Pain in limb secondary to onychomycosis 1 through 5 bilateral.  Plan: Debridement of nails 1 through 5  bilateral. Discussed pros and cons of surgical intervention and I will follow-up with him in the near future for surgery if needed.

## 2014-02-18 ENCOUNTER — Other Ambulatory Visit: Payer: Self-pay | Admitting: Internal Medicine

## 2014-03-04 ENCOUNTER — Emergency Department (HOSPITAL_COMMUNITY)
Admission: EM | Admit: 2014-03-04 | Discharge: 2014-03-04 | Disposition: A | Discharge status: Home / Self Care | Payer: Medicare Other | Source: Home / Self Care | Attending: Emergency Medicine | Admitting: Emergency Medicine

## 2014-03-04 ENCOUNTER — Encounter (HOSPITAL_COMMUNITY): Payer: Self-pay | Admitting: *Deleted

## 2014-03-04 DIAGNOSIS — J069 Acute upper respiratory infection, unspecified: Secondary | ICD-10-CM

## 2014-03-04 NOTE — ED Notes (Addendum)
Pt  Reports  Symptoms of  Body  Aches     As  Well   As     Cough        Sinus  Congestion  / drainage       -      Pt is sitting uprigjht on the  Exam table  He  Is speaking in  Complete  sentances  And  Appears  In no acute  / severe  Distress  He  Has  Not taken his bpmeds in  Several days

## 2014-03-04 NOTE — ED Provider Notes (Signed)
CSN: [id number]     Arrival date & time 03/04/14  1145 History   First MD Initiated Contact with Patient 03/04/14 1255     Chief Complaint  Patient presents with  . URI   (Consider location/radiation/quality/duration/timing/severity/associated sxs/prior Treatment) HPI Comments: C/O cold sx's for 2 weeks.   Past Medical History  Diagnosis Date  . Anemia   . Type II or unspecified type diabetes mellitus without mention of complication, not stated as uncontrolled   . GERD (gastroesophageal reflux disease)   . Hyperlipidemia   . HTN (hypertension)   . Osteoarthritis   . Depression   . Adenomatous colon polyp 2011  . OSA on CPAP    Past Surgical History  Procedure Laterality Date  . Pilonidal cyst excision    . Shoulder surgery      Right  . Cardiac catheterization  2004    patent coronary arteries   Family History  Problem Relation Age of Onset  . Coronary artery disease Other   . Sudden death Other   . Stroke Other   . Diabetes Other   . Alcohol abuse Other   . Coronary artery disease Other     CABG  . Heart disease Mother   . Diabetes Mother   . Diabetes Sister    History  Substance Use Topics  . Smoking status: Former Smoker -- 10 years    Quit date: 04/06/1983  . Smokeless tobacco: Not on file  . Alcohol Use: No     Comment: Occasional    Review of Systems  Constitutional: Positive for activity change. Negative for fever, diaphoresis and fatigue.  HENT: Positive for congestion, postnasal drip, rhinorrhea and sneezing. Negative for ear pain, facial swelling, sore throat and trouble swallowing.   Eyes: Negative for pain, discharge and redness.  Respiratory: Positive for cough. Negative for chest tightness and shortness of breath.   Cardiovascular: Negative.   Gastrointestinal: Negative.   Musculoskeletal: Negative.  Negative for neck pain and neck stiffness.  Neurological: Negative.     Allergies  Amlodipine and Statins  Home Medications   Prior to  Admission medications   Medication Sig Start Date End Date Taking? Authorizing Provider  albuterol (PROVENTIL HFA;VENTOLIN HFA) 108 (90 BASE) MCG/ACT inhaler Inhale 2 puffs into the lungs 4 (four) times daily. 09/05/13   Harden Mo, MD  amLODipine (NORVASC) 10 MG tablet  08/17/13   Historical Provider, MD  aspirin 81 MG EC tablet Take 81 mg by mouth once a week.      Historical Provider, MD  Blood Glucose Monitoring Suppl (RELION CONFIRM GLUCOSE MONITOR) W/DEVICE KIT 1 Device by Does not apply route once. 05/01/12   Renato Shin, MD  doxepin (SINEQUAN) 10 MG capsule Take 1 capsule (10 mg total) by mouth at bedtime. 05/03/13   Janith Lima, MD  glipiZIDE (GLIPIZIDE XL) 10 MG 24 hr tablet Take 1 tablet (10 mg total) by mouth daily with breakfast. 08/03/13   [name], MD  glucose blood (RELION ULTIMA TEST) test strip 1 each by Other route 2 (two) times daily. And lancets 2/day 05/01/12   Renato Shin, MD  losartan (COZAAR) 100 MG tablet Take 1 tablet (100 mg total) by mouth daily. 01/07/14   Janith Lima, MD  Memantine HCl-Donepezil HCl [facility]) 28-10 MG CP24 Take 1 tablet by mouth daily. 12/13/13   Janith Lima, MD  metFORMIN (GLUCOPHAGE) 1000 MG tablet Take 1 tablet (1,000 mg total) by mouth 2 (two) times daily with a  meal. 08/17/13   [name], MD  omeprazole (PRILOSEC) 20 MG capsule Take 1 capsule (20 mg total) by mouth daily. 04/19/13   Janith Lima, MD  tadalafil (CIALIS) 5 MG tablet Take 1 tablet (5 mg total) by mouth daily as needed for erectile dysfunction. 01/24/14   Janith Lima, MD  tiZANidine (ZANAFLEX) 4 MG tablet TAKE 1 TABLET (4 MG TOTAL) BY MOUTH EVERY 8 (EIGHT) HOURS AS NEEDED. 02/19/14   Janith Lima, MD   BP 176/94 mmHg  Pulse 78  Temp(Src) 98.4 F (36.9 C) (Oral)  Resp 16  SpO2 96% Physical Exam  Constitutional: He is oriented to person, place, and time. He appears well-developed and well-nourished. No distress.  HENT:  Mouth/Throat: No oropharyngeal  exudate.  Bilat TM's nl OP with minor erythema and clear PND  Eyes: Conjunctivae and EOM are normal.  Neck: Normal range of motion. Neck supple.  Cardiovascular: Normal rate, regular rhythm, normal heart sounds and intact distal pulses.   Pulmonary/Chest: Effort normal and breath sounds normal. No respiratory distress. He has no wheezes. He has no rales.  Musculoskeletal: He exhibits no edema or tenderness.  Lymphadenopathy:    He has no cervical adenopathy.  Neurological: He is alert and oriented to person, place, and time. He exhibits normal muscle tone.  Skin: Skin is warm and dry. No rash noted.  Psychiatric: He has a normal mood and affect.  Nursing note and vitals reviewed.   ED Course  Procedures (including critical care time) Labs Review Labs Reviewed - No data to display  Imaging Review No results found.   MDM   1. URI (upper respiratory infection)     Lots of nasal saline spray neosynephrine nasal spray 4 times a day for congestion Allegra or claritin for drainage Lots of fluids If your blood pressure is normal take Sudafed PE 10 mg for congestion Robitussin Dm for cough Make sure you take your BP medications daily Check BS frequently     Janne Napoleon, NP 03/04/14 1334

## 2014-03-04 NOTE — Discharge Instructions (Signed)
Upper Respiratory Infection, Adult Lots of nasal saline spray neosynephrine nasal spray 4 times a day for congestion Allegra or claritin for drainage If your blood pressure is normal take Sudafed PE 10 mg for congestion Robitussin Dm for cough Make sure you take your BP medications daily An upper respiratory infection (URI) is also sometimes known as the common cold. The upper respiratory tract includes the nose, sinuses, throat, trachea, and bronchi. Bronchi are the airways leading to the lungs. Most people improve within 1 week, but symptoms can last up to 2 weeks. A residual cough may last even longer.  CAUSES Many different viruses can infect the tissues lining the upper respiratory tract. The tissues become irritated and inflamed and often become very moist. Mucus production is also common. A cold is contagious. You can easily spread the virus to others by oral contact. This includes kissing, sharing a glass, coughing, or sneezing. Touching your mouth or nose and then touching a surface, which is then touched by another person, can also spread the virus. SYMPTOMS  Symptoms typically develop 1 to 3 days after you come in contact with a cold virus. Symptoms vary from person to person. They may include:  Runny nose.  Sneezing.  Nasal congestion.  Sinus irritation.  Sore throat.  Loss of voice (laryngitis).  Cough.  Fatigue.  Muscle aches.  Loss of appetite.  Headache.  Low-grade fever. DIAGNOSIS  You might diagnose your own cold based on familiar symptoms, since most people get a cold 2 to 3 times a year. Your caregiver can confirm this based on your exam. Most importantly, your caregiver can check that your symptoms are not due to another disease such as strep throat, sinusitis, pneumonia, asthma, or epiglottitis. Blood tests, throat tests, and X-rays are not necessary to diagnose a common cold, but they may sometimes be helpful in excluding other more serious diseases. Your  caregiver will decide if any further tests are required. RISKS AND COMPLICATIONS  You may be at risk for a more severe case of the common cold if you smoke cigarettes, have chronic heart disease (such as heart failure) or lung disease (such as asthma), or if you have a weakened immune system. The very young and very old are also at risk for more serious infections. Bacterial sinusitis, middle ear infections, and bacterial pneumonia can complicate the common cold. The common cold can worsen asthma and chronic obstructive pulmonary disease (COPD). Sometimes, these complications can require emergency medical care and may be life-threatening. PREVENTION  The best way to protect against getting a cold is to practice good hygiene. Avoid oral or hand contact with people with cold symptoms. Wash your hands often if contact occurs. There is no clear evidence that vitamin C, vitamin E, echinacea, or exercise reduces the chance of developing a cold. However, it is always recommended to get plenty of rest and practice good nutrition. TREATMENT  Treatment is directed at relieving symptoms. There is no cure. Antibiotics are not effective, because the infection is caused by a virus, not by bacteria. Treatment may include:  Increased fluid intake. Sports drinks offer valuable electrolytes, sugars, and fluids.  Breathing heated mist or steam (vaporizer or shower).  Eating chicken soup or other clear broths, and maintaining good nutrition.  Getting plenty of rest.  Using gargles or lozenges for comfort.  Controlling fevers with ibuprofen or acetaminophen as directed by your caregiver.  Increasing usage of your inhaler if you have asthma. Zinc gel and zinc lozenges, taken in  the first 24 hours of the common cold, can shorten the duration and lessen the severity of symptoms. Pain medicines may help with fever, muscle aches, and throat pain. A variety of non-prescription medicines are available to treat congestion  and runny nose. Your caregiver can make recommendations and may suggest nasal or lung inhalers for other symptoms.  HOME CARE INSTRUCTIONS   Only take over-the-counter or prescription medicines for pain, discomfort, or fever as directed by your caregiver.  Use a warm mist humidifier or inhale steam from a shower to increase air moisture. This may keep secretions moist and make it easier to breathe.  Drink enough water and fluids to keep your urine clear or pale yellow.  Rest as needed.  Return to work when your temperature has returned to normal or as your caregiver advises. You may need to stay home longer to avoid infecting others. You can also use a face mask and careful hand washing to prevent spread of the virus. SEEK MEDICAL CARE IF:   After the first few days, you feel you are getting worse rather than better.  You need your caregiver's advice about medicines to control symptoms.  You develop chills, worsening shortness of breath, or brown or red sputum. These may be signs of pneumonia.  You develop yellow or brown nasal discharge or pain in the face, especially when you bend forward. These may be signs of sinusitis.  You develop a fever, swollen neck glands, pain with swallowing, or white areas in the back of your throat. These may be signs of strep throat. SEEK IMMEDIATE MEDICAL CARE IF:   You have a fever.  You develop severe or persistent headache, ear pain, sinus pain, or chest pain.  You develop wheezing, a prolonged cough, cough up blood, or have a change in your usual mucus (if you have chronic lung disease).  You develop sore muscles or a stiff neck. Document Released: 09/15/2000 Document Revised: 06/14/2011 Document Reviewed: 06/27/2013 [facility] Patient Information 2015 [place], [place]. This information is not intended to replace advice given to you by your health care provider. Make sure you discuss any questions you have with your health care provider.

## 2014-03-13 ENCOUNTER — Telehealth: Payer: Self-pay | Admitting: Internal Medicine

## 2014-03-13 NOTE — Telephone Encounter (Signed)
Patient

## 2014-03-15 ENCOUNTER — Telehealth: Payer: Self-pay | Admitting: *Deleted

## 2014-03-15 MED ORDER — GLIPIZIDE ER 5 MG PO TB24: 5.0000 mg | ORAL_TABLET | Freq: Every day | ORAL | Status: DC

## 2014-03-15 NOTE — Telephone Encounter (Signed)
Called pt and lvm advising him per Dr Arman Filter note. Sent Glipizide XL 5 mg to his pharmacy. Advised pt to let us know how he is feeling.

## 2014-03-15 NOTE — Telephone Encounter (Signed)
Pt called stating that the pharmacy changed manufactures for the glipizide (he has called around to other pharmacies as well, they are using this particular manuf, as well) and it does not seem to be doing as well. Pt feels nauseated and his sugars are going back up. Please advise.

## 2014-03-15 NOTE — Telephone Encounter (Signed)
Please check if he got the Glipizide XL or the regular Glipizide. If he got the Glipizide XL 10 mg >> let's try to send Glipizide XL 5 mg and try that.

## 2014-03-19 ENCOUNTER — Ambulatory Visit: Payer: Medicare Other | Admitting: Internal Medicine

## 2014-03-19 LAB — HM DIABETES EYE EXAM

## 2014-03-22 ENCOUNTER — Encounter: Payer: Self-pay | Admitting: Internal Medicine

## 2014-04-02 ENCOUNTER — Encounter: Payer: Self-pay | Admitting: Internal Medicine

## 2014-04-19 ENCOUNTER — Other Ambulatory Visit: Payer: Self-pay | Admitting: Cardiology

## 2014-04-19 NOTE — Telephone Encounter (Signed)
Rx refill sent to patient pharmacy   

## 2014-04-22 ENCOUNTER — Encounter: Payer: Self-pay | Admitting: Gastroenterology

## 2014-04-26 ENCOUNTER — Ambulatory Visit: Payer: Medicare Other | Admitting: Internal Medicine

## 2014-04-29 ENCOUNTER — Telehealth: Payer: Self-pay | Admitting: Internal Medicine

## 2014-04-29 ENCOUNTER — Ambulatory Visit: Payer: Medicare Other | Admitting: Internal Medicine

## 2014-04-29 NOTE — Telephone Encounter (Signed)
Please read note below and advise.  

## 2014-04-29 NOTE — Telephone Encounter (Signed)
Patient called stating he needs to change his medication  The current medication Glipizide is not working; would like insulin  Patient has been using his sisters insulin!   The patient called the office today at 1:00 pm needing to reschedule his missed appointment for today    Please advise patient   Thank You

## 2014-04-29 NOTE — Telephone Encounter (Signed)
Called pt and advised him per Dr Arman Filter note. Pt understood. Pt to come in on Feb 4th to see Dr Cruzita Lederer to discuss.

## 2014-04-29 NOTE — Telephone Encounter (Signed)
I need to see him first.

## 2014-05-08 ENCOUNTER — Ambulatory Visit: Payer: Self-pay | Admitting: Internal Medicine

## 2014-05-09 ENCOUNTER — Encounter: Payer: Self-pay | Admitting: Internal Medicine

## 2014-05-09 ENCOUNTER — Ambulatory Visit (INDEPENDENT_AMBULATORY_CARE_PROVIDER_SITE_OTHER): Payer: BLUE CROSS/BLUE SHIELD | Admitting: Internal Medicine

## 2014-05-09 VITALS — BP 130/82 | HR 85 | Temp 97.8°F | Resp 12 | Wt 252.0 lb

## 2014-05-09 DIAGNOSIS — E118 Type 2 diabetes mellitus with unspecified complications: Secondary | ICD-10-CM

## 2014-05-09 LAB — COMPREHENSIVE METABOLIC PANEL
ALT: 17 U/L (ref 0–53)
AST: 16 U/L (ref 0–37)
Albumin: 4 g/dL (ref 3.5–5.2)
Alkaline Phosphatase: 105 U/L (ref 39–117)
BUN: 13 mg/dL (ref 6–23)
CALCIUM: 9.4 mg/dL (ref 8.4–10.5)
CO2: 28 mEq/L (ref 19–32)
Chloride: 106 mEq/L (ref 96–112)
Creatinine, Ser: 1.08 mg/dL (ref 0.40–1.50)
GFR: 88.47 mL/min (ref >60.00)
Glucose, Bld: 169 mg/dL — ABNORMAL HIGH (ref 70–99)
Potassium: 3.8 mEq/L (ref 3.5–5.1)
Sodium: 141 mEq/L (ref 135–145)
Total Bilirubin: 0.3 mg/dL (ref 0.2–1.2)
Total Protein: 7.1 g/dL (ref 6.0–8.3)

## 2014-05-09 LAB — HEMOGLOBIN A1C: HEMOGLOBIN A1C: 9.4 % — AB (ref 4.6–6.5)

## 2014-05-09 LAB — LIPID PANEL
CHOL/HDL RATIO: 4
Cholesterol: 201 mg/dL — ABNORMAL HIGH (ref 0–200)
HDL: 45.8 mg/dL (ref >39.00)
LDL CALC: 141 mg/dL — AB (ref 0–99)
NonHDL: 155.2
TRIGLYCERIDES: 69 mg/dL (ref 0.0–149.0)
VLDL: 13.8 mg/dL (ref 0.0–40.0)

## 2014-05-09 MED ORDER — INSULIN PEN NEEDLE 32G X 4 MM MISC: Status: DC

## 2014-05-09 MED ORDER — INSULIN GLARGINE 300 UNIT/ML ~~LOC~~ SOPN: 20.0000 [IU] | PEN_INJECTOR | Freq: Every day | SUBCUTANEOUS | Status: DC

## 2014-05-09 NOTE — Patient Instructions (Signed)
Please continue Metformin to 1000 mg 2x a day. Continue Glipizide XL 10 mg daily in am.  Please add Toujeo 20 units at bedtime. If sugars in am not <150 in 4 days, please increase to 23 units.  Please return in 1.5 months with your sugar log.  Please stop at the lab.

## 2014-05-09 NOTE — Progress Notes (Signed)
Patient ID: [name], male   DOB: [birth year], 64 y.o.   MRN: [id number]  HPI: [name] is a 64 y.o.-year-old male, returning for f/u for DM2, dx 2007, insulin-dependent since 2014, uncontrolled, with complications (+ ED). Last visit 4 mo ago.  He called several days ago that sugars uncontrolled >> wanted to start back insulin and was using his sister's insulin. He denies this now.  Last hemoglobin A1c was: Lab Results  Component Value Date   HGBA1C 7.1* 11/05/2013   HGBA1C 7.4* 08/03/2013   HGBA1C 8.3* 04/19/2013   Pt is on a regimen of: - Metformin 1000 mg 2x a day - Glipizide XL 10 mg daily in am. He was Levemir 140 units in am >> decreased to 70 units>> stopped as this was too expensive for him He stopped Welchol 3.7g at bedtime a day - at last  Stopped Farxiga 10 mg b/c frequent urination and urgency. He was on Glipizide in the past. He was on Metformin in the past >> no N/V.   Pt checks his sugars 2x a day and they are much higher: - am: 97-360 (most 200s) >> 79-117, 142 >> 49x1 (today), 89-130, 142 >> 90, 166-223, 257, 306 - 2h after b'fast: 150s >> 102, 146, 171, 191 >> n/c >> 128, 134 - before lunch: n/c >> 112-168 (mostly on the low limit) >> n/c >> 120, 224-245 - 2h after lunch: n/c >> 210 (corrected the 79 in am) >> n/c >> 144, 205 - before dinner: n/c >> 143 >> n/c >> 134-225, 263  - 2h after dinner: n/c  - bedtime: n/c - nighttime: n/c >> 134, 210 >> 99-190 >> 94, 190-305 No lows.Lowest sugar was 90; ? has hypoglycemia awareness  Highest sugar was 360 >> 190 >> 305  - + mild CKD, last BUN/creatinine:  Lab Results  Component Value Date   BUN 9 09/05/2013   CREATININE 1.00 09/05/2013  On Losartan. - last set of lipids: Lab Results  Component Value Date   CHOL 221* 12/18/2012   HDL 41.00 12/18/2012   LDLCALC 143* 09/29/2011   LDLDIRECT 168.0 12/18/2012   TRIG 102.0 12/18/2012   CHOLHDL 5 12/18/2012  On Pravastatin. - last eye exam was in  08/2013. No DR.  - no numbness and tingling in his feet.  I reviewed pt's medications, allergies, PMH, social hx, family hx, and changes were documented in the history of present illness. Otherwise, unchanged from my initial visit note. Will see a urologist for overactive bladder soon.   ROS: Constitutional: + weight gain, + fatigue, no subjective hyperthermia, + increased urination, + nocturia, + poor sleep Eyes: + blurry vision, no xerophthalmia ENT: no sore throat, no nodules palpated in throat, no dysphagia/odynophagia, no hoarseness Cardiovascular: no CP/SOB/+ palpitations/no leg swelling Respiratory: no cough/SOB Gastrointestinal: no N/V/D/C, + heartburn  Musculoskeletal: + muscle/+ joint aches Skin: no rashes, + hair loss Neurological: no tremors/numbness/tingling/dizziness  PE: BP 130/82 mmHg  Pulse 85  Temp(Src) 97.8 F (36.6 C) (Oral)  Resp 12  Wt 252 lb (114.306 kg)  SpO2 95% Wt Readings from Last 3 Encounters:  05/09/14 252 lb (114.306 kg)  01/29/14 247 lb (112.038 kg)  01/24/14 247 lb (112.038 kg)   Constitutional: overweight, in NAD Eyes: PERRLA, EOMI, no exophthalmos ENT: moist mucous membranes, no thyromegaly, no cervical lymphadenopathy Cardiovascular: RRR, No MRG Respiratory: CTA B Gastrointestinal: abdomen soft, NT, ND, BS+ Musculoskeletal: no deformities, strength intact in all 4 Skin: moist, warm, no rashes Neurological:  no tremor with outstretched hands, DTR normal in all 4  ASSESSMENT: 1. DM2, insulin-dependent, uncontrolled, with complications - ED  PLAN:  1. Patient with long-standing, uncontrolled diabetes (although improved at last visit - A1c 7.1%), on only oral antidiabetic regimen; with recently increased sugars (? Explanation!!). I agree with him, we need to add back basal insulin. - We discussed about options for treatment, and I suggested to:  Patient Instructions  Please continue Metformin to 1000 mg 2x a day. Continue Glipizide XL 10  mg daily in am.  Please add Toujeo 20 units at bedtime. If sugars in am not <150 in 4 days, please increase to 23 units.  Please return in 1.5 months with your sugar log.  Please stop at the lab.  - continue checking sugars at different times of the day - check 2 times a day, rotating checks - advised for yearly eye exams, he is up to date  - check Hba1c, CMP and lipid panel - Return to clinic in 1.5 mo with sugar log   Office Visit on 05/09/2014  Component Date Value Ref Range Status  . Hgb A1c MFr Bld 05/09/2014 9.4* 4.6 - 6.5 % Final   Glycemic Control Guidelines for People with Diabetes:Non Diabetic:  <6%Goal of Therapy: <7%Additional Action Suggested:  >8%   . Sodium 05/09/2014 141  135 - 145 mEq/L Final  . Potassium 05/09/2014 3.8  3.5 - 5.1 mEq/L Final  . Chloride 05/09/2014 106  96 - 112 mEq/L Final  . CO2 05/09/2014 28  19 - 32 mEq/L Final  . Glucose, Bld 05/09/2014 169* 70 - 99 mg/dL Final  . BUN 05/09/2014 13  6 - 23 mg/dL Final  . Creatinine, Ser 05/09/2014 1.08  0.40 - 1.50 mg/dL Final  . Total Bilirubin 05/09/2014 0.3  0.2 - 1.2 mg/dL Final  . Alkaline Phosphatase 05/09/2014 105  39 - 117 U/L Final  . AST 05/09/2014 16  0 - 37 U/L Final  . ALT 05/09/2014 17  0 - 53 U/L Final  . Total Protein 05/09/2014 7.1  6.0 - 8.3 g/dL Final  . Albumin 05/09/2014 4.0  3.5 - 5.2 g/dL Final  . Calcium 05/09/2014 9.4  8.4 - 10.5 mg/dL Final  . GFR 05/09/2014 88.47  >60.00 mL/min Final  . Cholesterol 05/09/2014 201* 0 - 200 mg/dL Final   ATP III Classification       Desirable:  < 200 mg/dL               Borderline High:  200 - 239 mg/dL          High:  > = 240 mg/dL  . Triglycerides 05/09/2014 69.0  0.0 - 149.0 mg/dL Final   Normal:  <150 mg/dLBorderline High:  150 - 199 mg/dL  . HDL 05/09/2014 45.80  >39.00 mg/dL Final  . VLDL 05/09/2014 13.8  0.0 - 40.0 mg/dL Final  . LDL Cholesterol 05/09/2014 141* 0 - 99 mg/dL Final  . Total CHOL/HDL Ratio 05/09/2014 4   Final                   Men          Women1/2 Average Risk     3.4          3.3Average Risk          5.0          4.42X Average Risk          9.6  7.13X Average Risk          15.0          11.0                      . NonHDL 05/09/2014 155.20   Final   NOTE:  Non-HDL goal should be 30 mg/dL higher than patient's LDL goal (i.e. LDL goal of < 70 mg/dL, would have non-HDL goal of < 100 mg/dL)   HbA1c higher >> see above plan to add basal insulin. LDL higher than target >> will advise to d/w cardiologist. Cr a little higher.

## 2014-05-10 ENCOUNTER — Encounter: Payer: Self-pay | Admitting: Internal Medicine

## 2014-06-06 ENCOUNTER — Telehealth: Payer: Self-pay | Admitting: *Deleted

## 2014-06-07 ENCOUNTER — Telehealth: Payer: Self-pay | Admitting: *Deleted

## 2014-06-07 NOTE — Telephone Encounter (Signed)
Pt called left no message.  I called [phone] a male speaker stated pt wasn't home and hung up, called [phone] and no voicemail was set up.

## 2014-06-07 NOTE — Telephone Encounter (Signed)
Entered in error

## 2014-06-11 ENCOUNTER — Ambulatory Visit (INDEPENDENT_AMBULATORY_CARE_PROVIDER_SITE_OTHER): Payer: Medicare Other | Admitting: Gastroenterology

## 2014-06-11 ENCOUNTER — Encounter: Payer: Self-pay | Admitting: Gastroenterology

## 2014-06-11 VITALS — BP 144/90 | HR 84 | Ht 71.0 in | Wt 255.0 lb

## 2014-06-11 DIAGNOSIS — K219 Gastro-esophageal reflux disease without esophagitis: Secondary | ICD-10-CM

## 2014-06-11 DIAGNOSIS — R1314 Dysphagia, pharyngoesophageal phase: Secondary | ICD-10-CM

## 2014-06-11 MED ORDER — BISACODYL 5 MG PO TBEC: DELAYED_RELEASE_TABLET | ORAL | Status: DC

## 2014-06-11 MED ORDER — POLYETHYLENE GLYCOL 3350 17 GM/SCOOP PO POWD
ORAL | Status: DC
Start: 2014-06-11 — End: 2014-08-02

## 2014-06-11 NOTE — Progress Notes (Signed)
Review of pertinent gastrointestinal problems: 1. Adenomatous polyp: colonoscopy 12/2009  done for routine risk screening; 4mm polyp, recommended recall at 5 years. 2. Left sided colon diverticulosis, noted on colonoscopy above.    HPI: This is a  very pleasant 64-year-old man  who was referred to me by Jones, Thomas L, MD  to evaluate  To discuss colonoscopy and possible upper endoscopy  He has belching, gas. Has solid food dysphagia, not new. Probably in past 2 years.    He notices blood in stool with every bowel movement, usually on the toilette.  Stable weight overall.  + pyrosis. Takes prilosec just on as needed basis.  Takes it about 3 times per week but it doesn't help.  Takes zantac 2 times per week, this helps with dyspepsia  Sometimes has to push and strain to move his bowels.    Review of systems: Pertinent positive and negative review of systems were noted in the above HPI section. Complete review of systems was performed and was otherwise normal.    Past Medical History  Diagnosis Date  . Anemia   . Type II or unspecified type diabetes mellitus without mention of complication, not stated as uncontrolled   . GERD (gastroesophageal reflux disease)   . Hyperlipidemia   . HTN (hypertension)   . Osteoarthritis   . Depression   . Adenomatous colon polyp 2011  . OSA on CPAP     Past Surgical History  Procedure Laterality Date  . Pilonidal cyst excision    . Shoulder surgery      Right  . Cardiac catheterization  2004    patent coronary arteries    Current Outpatient Prescriptions  Medication Sig Dispense Refill  . amLODipine (NORVASC) 10 MG tablet TAKE 1 TABLET (10 MG TOTAL) BY MOUTH DAILY. 180 tablet 1  . aspirin 81 MG EC tablet Take 81 mg by mouth once a week.      . Blood Glucose Monitoring Suppl (RELION CONFIRM GLUCOSE MONITOR) W/DEVICE KIT 1 Device by Does not apply route once. 1 kit 0  . glipiZIDE (GLIPIZIDE XL) 10 MG 24 hr tablet Take 1 tablet  (10 mg total) by mouth daily with breakfast. 90 tablet 3  . glucose blood (RELION ULTIMA TEST) test strip 1 each by Other route 2 (two) times daily. And lancets 2/day 100 each 12  . Insulin Glargine (TOUJEO SOLOSTAR) 300 UNIT/ML SOPN Inject 20 Units into the skin at bedtime. 3 pen 2  . Insulin Pen Needle (NOVOFINE PLUS) 32G X 4 MM MISC Use 1x a day 100 each 11  . losartan (COZAAR) 100 MG tablet Take 1 tablet (100 mg total) by mouth daily. 90 tablet 3  . metFORMIN (GLUCOPHAGE) 1000 MG tablet Take 1 tablet (1,000 mg total) by mouth 2 (two) times daily with a meal. 180 tablet 2  . omeprazole (PRILOSEC) 20 MG capsule Take 1 capsule (20 mg total) by mouth daily. 90 capsule 3   No current facility-administered medications for this visit.    Allergies as of 06/11/2014 - Review Complete 06/11/2014  Allergen Reaction Noted  . Amlodipine  08/20/2013  . Statins      Family History  Problem Relation Age of Onset  . Coronary artery disease Mother   . Stroke Mother   . Diabetes Maternal Uncle     x2  . Alcohol abuse Other   . Coronary artery disease Other     CABG  . Heart disease Mother   . Diabetes Mother   .   Diabetes Sister   . Diabetes Paternal Uncle     x2  . Colon cancer Paternal Uncle   . Colon polyps Neg Hx   . Esophageal cancer Neg Hx     History   Social History  . Marital Status: Married    Spouse Name: N/A  . Number of Children: 4  . Years of Education: N/A   Occupational History  . Retired Unemployed   Social History Main Topics  . Smoking status: Former Smoker -- 10 years    Types: Cigarettes    Quit date: 04/06/1983  . Smokeless tobacco: Never Used  . Alcohol Use: 0.0 oz/week    0 Standard drinks or equivalent per week     Comment: Occasional  . Drug Use: No  . Sexual Activity: Yes   Other Topics Concern  . Not on file   Social History Narrative   Regular Exercise -  NO       Physical Exam: BP 144/90 mmHg  Pulse 84  Ht 5' 11" (1.803 m)  Wt 255  lb (115.667 kg)  BMI 35.58 kg/m2 Constitutional: generally well-appearing Psychiatric: alert and oriented x3 Eyes: extraocular movements intact Mouth: oral pharynx moist, no lesions Neck: supple no lymphadenopathy Cardiovascular: heart regular rate and rhythm Lungs: clear to auscultation bilaterally Abdomen: soft, nontender, nondistended, no obvious ascites, no peritoneal signs, normal bowel sounds Extremities: no lower extremity edema bilaterally Skin: no lesions on visible extremities    Assessment and plan: 64 y.o. male with  intermittent rectal bleeding, intermittent dysphasia, personal history of adenomatous colon polyps  I suspect his bleeding is minor, anal rectal in origin. He does seem like he has minor constipation at times. He is however due for colonoscopy for colon polyp surveillance later on this year and we will do that for him in the next month or so. At the same time I'll proceed with upper endoscopy for his GERD-like symptoms and his intermittent chronic dysphagia without any other alarm symptoms.   Cc: Ronald Lima, MD

## 2014-06-11 NOTE — Patient Instructions (Addendum)

## 2014-06-11 NOTE — Addendum Note (Signed)
Addended by: Larina Bras on: 06/11/2014 03:10 PM   Modules accepted: Orders

## 2014-06-12 ENCOUNTER — Encounter: Payer: Self-pay | Admitting: Cardiovascular Disease

## 2014-06-18 ENCOUNTER — Ambulatory Visit: Payer: Self-pay | Admitting: Podiatry

## 2014-06-20 ENCOUNTER — Encounter: Payer: Self-pay | Admitting: Gastroenterology

## 2014-08-02 ENCOUNTER — Encounter: Payer: Self-pay | Admitting: Gastroenterology

## 2014-08-02 ENCOUNTER — Ambulatory Visit (AMBULATORY_SURGERY_CENTER): Payer: Medicare Other | Admitting: Gastroenterology

## 2014-08-02 VITALS — BP 150/91 | HR 55 | Temp 98.4°F | Resp 22 | Ht 71.0 in | Wt 255.0 lb

## 2014-08-02 DIAGNOSIS — K222 Esophageal obstruction: Secondary | ICD-10-CM | POA: Diagnosis not present

## 2014-08-02 DIAGNOSIS — K2100 Gastro-esophageal reflux disease with esophagitis, without bleeding: Secondary | ICD-10-CM

## 2014-08-02 DIAGNOSIS — K449 Diaphragmatic hernia without obstruction or gangrene: Secondary | ICD-10-CM

## 2014-08-02 DIAGNOSIS — Z86010 Personal history of colonic polyps: Secondary | ICD-10-CM

## 2014-08-02 DIAGNOSIS — R1314 Dysphagia, pharyngoesophageal phase: Secondary | ICD-10-CM

## 2014-08-02 LAB — GLUCOSE, CAPILLARY
GLUCOSE-CAPILLARY: 88 mg/dL (ref 70–99)
Glucose-Capillary: 95 mg/dL (ref 70–99)

## 2014-08-02 MED ORDER — SODIUM CHLORIDE 0.9 % IV SOLN: 500.0000 mL | INTRAVENOUS | Status: DC

## 2014-08-02 MED ORDER — OMEPRAZOLE 20 MG PO CPDR: 20.0000 mg | DELAYED_RELEASE_CAPSULE | Freq: Two times a day (BID) | ORAL | Status: DC

## 2014-08-02 NOTE — Progress Notes (Signed)
Called to room to assist during endoscopic procedure.  Patient ID and intended procedure confirmed with present staff. Received instructions for my participation in the procedure from the performing physician.  

## 2014-08-02 NOTE — Op Note (Signed)
[place]  [address]. [place], [address]   ENDOSCOPY PROCEDURE REPORT  PATIENT: [name], Confer  MR#: [id number] BIRTHDATE: Jun 21, 1950 , 30  yrs. old GENDER: male ENDOSCOPIST: Milus Banister, MD PROCEDURE DATE:  08/02/2014 PROCEDURE:  EGD w/ balloon dilation ASA CLASS:     Class II INDICATIONS:  chronic GERD, dysphagia intermittently. MEDICATIONS: Monitored anesthesia care and Propofol 50 mg IV TOPICAL ANESTHETIC: none  DESCRIPTION OF PROCEDURE: After the risks benefits and alternatives of the procedure were thoroughly explained, informed consent was obtained.  The LB [id number] endoscope was introduced through the mouth and advanced to the second portion of the duodenum , Without limitations.  The instrument was slowly withdrawn as the mucosa was fully examined.  There was a small (2cm) hiatal hernia.  There was a single reflux related linear erosion at GE junction (1-2cm long).  There was a ring-like mucosal stricture that was mildly "shelf-like" and was dilated using a 36mm CRE TTS balloon inflated for 60 seconds. There was no bleeding post dilation.  The examination was otherwise normal.  Retroflexed views revealed no abnormalities.     The scope was then withdrawn from the patient and the procedure completed.  COMPLICATIONS: There were no immediate complications.  ENDOSCOPIC IMPRESSION: There was a small (2cm) hiatal hernia.  There was a single reflux related linear erosion at GE junction (1-2cm long).  There was a ring-like mucosal stricture that was mildly "shelf-like" and was dilated using a 32mm CRE TTS balloon inflated for 60 seconds. There was no bleeding post dilation.  The examination was otherwise normal  RECOMMENDATIONS: Chew your food well, eat slowly and take small bites. A new prescription for TWICE daily omeprazole was called in to your pharmacy.  Please take 1 pill 20-30 min prior to breakfast and dinner meals.  eSigned:   Milus Banister, MD 08/02/2014 8:03 AM

## 2014-08-02 NOTE — Op Note (Signed)
[place]  [address]. [place], [address]   COLONOSCOPY PROCEDURE REPORT  PATIENT: [name], Pelcher  MR#: [id number] BIRTHDATE: [birth year] , 55  yrs. old GENDER: male ENDOSCOPIST: Milus Banister, MD PROCEDURE DATE:  08/02/2014 PROCEDURE:   Colonoscopy, diagnostic and Colonoscopy, surveillance First Screening Colonoscopy - Avg.  risk and is 50 yrs.  old or older - No.  Prior Negative Screening - Now for repeat screening. N/A  History of Adenoma - Now for follow-up colonoscopy & has been > or = to 3 yrs.  Yes hx of adenoma.  Has been 3 or more years since last colonoscopy. ASA CLASS:   Class II INDICATIONS:Surveillance due to prior colonic neoplasia and recent rectal bleeding. MEDICATIONS: Monitored anesthesia care, Propofol 300 mg IV, and lidocaine 40mg  IV  DESCRIPTION OF PROCEDURE:   After the risks benefits and alternatives of the procedure were thoroughly explained, informed consent was obtained.  The digital rectal exam revealed no abnormalities of the rectum.   The LB [id number]  endoscope was introduced through the anus and advanced to the cecum, which was identified by both the appendix and ileocecal valve. No adverse events experienced.   The quality of the prep was excellent.  The instrument was then slowly withdrawn as the colon was fully examined.   COLON FINDINGS: A normal appearing cecum, ileocecal valve, and appendiceal orifice were identified.  The ascending, transverse, descending, sigmoid colon, and rectum appeared unremarkable. Small external Grade I hemorrhoids were found.  Retroflexed views revealed no abnormalities. The time to cecum = 3.6 Withdrawal time = 7.2   The scope was withdrawn and the procedure completed. COMPLICATIONS: There were no immediate complications.  ENDOSCOPIC IMPRESSION: 1.   Normal colonoscopy 2.   Small external Grade I hemorrhoids  RECOMMENDATIONS: You should continue to follow colorectal cancer  screening guidelines for "routine risk" patients with a repeat colonoscopy in 10 years.  eSigned:  Milus Banister, MD 08/02/2014 7:50 AM

## 2014-08-02 NOTE — Patient Instructions (Signed)
YOU HAD AN ENDOSCOPIC PROCEDURE TODAY AT THE [facility]:   Refer to the procedure report that was given to you for any specific questions about what was found during the examination.  If the procedure report does not answer your questions, please call your gastroenterologist to clarify.  If you requested that your care partner not be given the details of your procedure findings, then the procedure report has been included in a sealed envelope for you to review at your convenience later.  YOU SHOULD EXPECT: Some feelings of bloating in the abdomen. Passage of more gas than usual.  Walking can help get rid of the air that was put into your GI tract during the procedure and reduce the bloating. If you had a lower endoscopy (such as a colonoscopy or flexible sigmoidoscopy) you may notice spotting of blood in your stool or on the toilet paper. If you underwent a bowel prep for your procedure, you may not have a normal bowel movement for a few days.  Please Note:  You might notice some irritation and congestion in your nose or some drainage.  This is from the oxygen used during your procedure.  There is no need for concern and it should clear up in a day or so.  SYMPTOMS TO REPORT IMMEDIATELY:   Following lower endoscopy (colonoscopy or flexible sigmoidoscopy):  Excessive amounts of blood in the stool  Significant tenderness or worsening of abdominal pains  Swelling of the abdomen that is new, acute  Fever of 100F or higher   Following upper endoscopy (EGD)  Vomiting of blood or coffee ground material  New chest pain or pain under the shoulder blades  Painful or persistently difficult swallowing  New shortness of breath  Fever of 100F or higher  Black, tarry-looking stools  For urgent or emergent issues, a gastroenterologist can be reached at any hour by calling [phone].   DIET: Your first meal following the procedure should be a small meal and then it is ok to progress to  your normal diet. Heavy or fried foods are harder to digest and may make you feel nauseous or bloated.  Likewise, meals heavy in dairy and vegetables can increase bloating.  Drink plenty of fluids but you should avoid alcoholic beverages for 24 hours.  ACTIVITY:  You should plan to take it easy for the rest of today and you should NOT DRIVE or use heavy machinery until tomorrow (because of the sedation medicines used during the test).    FOLLOW UP: Our staff will call the number listed on your records the next business day following your procedure to check on you and address any questions or concerns that you may have regarding the information given to you following your procedure. If we do not reach you, we will leave a message.  However, if you are feeling well and you are not experiencing any problems, there is no need to return our call.  We will assume that you have returned to your regular daily activities without incident.  If any biopsies were taken you will be contacted by phone or by letter within the next 1-3 weeks.  Please call us at [phone] if you have not heard about the biopsies in 3 weeks.    SIGNATURES/CONFIDENTIALITY: You and/or your care partner have signed paperwork which will be entered into your electronic medical record.  These signatures attest to the fact that that the information above on your After Visit Summary has been reviewed   and is understood.  Full responsibility of the confidentiality of this discharge information lies with you and/or your care-partner.  Recommendations Next colonoscopy in 10 years. Chew food well and eat slowly. Omeprazole twice daily, take 20-30 minutes before meal at breakfast and dinnertime. Discharge instructions given to patient and/or care partner. Dilation diet instructions given and handout provided.

## 2014-08-02 NOTE — Progress Notes (Signed)
Stable to RR 

## 2014-08-05 ENCOUNTER — Telehealth: Payer: Self-pay | Admitting: *Deleted

## 2014-08-05 NOTE — Telephone Encounter (Signed)
Number identifier, left message, follow-up  

## 2014-08-21 ENCOUNTER — Ambulatory Visit (INDEPENDENT_AMBULATORY_CARE_PROVIDER_SITE_OTHER): Payer: Federal, State, Local not specified - PPO | Admitting: Internal Medicine

## 2014-08-21 ENCOUNTER — Encounter: Payer: Self-pay | Admitting: Internal Medicine

## 2014-08-21 VITALS — BP 148/80 | HR 68 | Temp 98.7°F | Resp 18 | Wt 248.0 lb

## 2014-08-21 DIAGNOSIS — E118 Type 2 diabetes mellitus with unspecified complications: Secondary | ICD-10-CM | POA: Diagnosis not present

## 2014-08-21 LAB — HEMOGLOBIN A1C: Hgb A1c MFr Bld: 7.1 % — ABNORMAL HIGH (ref 4.6–6.5)

## 2014-08-21 MED ORDER — INSULIN GLARGINE 100 UNIT/ML SOLOSTAR PEN: 30.0000 [IU] | PEN_INJECTOR | Freq: Every day | SUBCUTANEOUS | Status: DC

## 2014-08-21 NOTE — Patient Instructions (Signed)
Please stop Toujeo and start Lantus 20 units at bedtime Increase Glipizide XL to 10 mg in am Continue Metformin 1000 mg 2x a day.  Please come back for a follow-up appointment in 3 months.  Please stop at the lab.

## 2014-08-21 NOTE — Progress Notes (Addendum)
Patient ID: [name], male   DOB: November 03, 1950, 64 y.o.   MRN: [id number]  HPI: [name] is a 64 y.o.-year-old male, returning for f/u for DM2, dx 2007, insulin-dependent since 2014, uncontrolled, with complications (+ ED). Last visit 1 mo ago. He changed PCPs to Dr Odette Fraction.  Last hemoglobin A1c was: Lab Results  Component Value Date   HGBA1C 9.4* 05/09/2014   HGBA1C 7.1* 11/05/2013   HGBA1C 7.4* 08/03/2013   Pt is on a regimen of: - Metformin 1000 mg 2x a day - Glipizide XL 5 mg daily in am. - Toujeo 20 units in hs - started 07/2014 He was Levemir 140 units in am >> decreased to 70 units>> stopped as this was too expensive for him He stopped Welchol 3.7g at bedtime a day - at last  Stopped Farxiga 10 mg b/c frequent urination and urgency. He was on Glipizide in the past. He was on Metformin in the past >> no N/V.   Pt checks his sugars 2x a day and they are better: - am: 97-360 (most 200s) >> 79-117, 142 >> 49x1 (today), 89-130, 142 >> 90, 166-223, 257, 306 >> 90-163 - 2h after b'fast: 150s >> 102, 146, 171, 191 >> n/c >> 128, 134 >> n/c - before lunch: n/c >> 112-168 (mostly on the low limit) >> n/c >> 120, 224-245 >> 206 - 2h after lunch: n/c >> 210 (corrected the 79 in am) >> n/c >> 144, 205  - before dinner: n/c >> 143 >> n/c >> 134-225, 263 >> 118-205 - 2h after dinner: n/c >> 143, 175 - bedtime: n/c >> 105-165, 185 - nighttime: n/c >> 134, 210 >> 99-190 >> 94, 190-305 >> 148-216 No lows.Lowest sugar was 90; ? has hypoglycemia awareness  Highest sugar was 360 >> 190 >> 305  - + mild CKD, last BUN/creatinine:  Lab Results  Component Value Date   BUN 13 05/09/2014   CREATININE 1.08 05/09/2014  On Losartan. - last set of lipids: Lab Results  Component Value Date   CHOL 201* 05/09/2014   HDL 45.80 05/09/2014   LDLCALC 141* 05/09/2014   LDLDIRECT 168.0 12/18/2012   TRIG 69.0 05/09/2014   CHOLHDL 4 05/09/2014  On Pravastatin. - last eye exam was in 08/2013.  No DR. Has an exam at the end of the month. - no numbness and tingling in his feet.  I reviewed pt's medications, allergies, PMH, social hx, family hx, and changes were documented in the history of present illness. Otherwise, unchanged from my initial visit note. Sees a urologist for overactive bladder soon.   ROS: Constitutional: no weight gain, + fatigue, no subjective hyperthermia, + increased urination, + nocturia Eyes: + blurry vision, no xerophthalmia ENT: no sore throat, no nodules palpated in throat, no dysphagia/odynophagia, no hoarseness, + tinnitus Cardiovascular: no CP/SOB/+ palpitations/no leg swelling Respiratory: + cough/no SOB Gastrointestinal: no N/V/D/C, + heartburn  Musculoskeletal: + muscle/+ joint aches Skin: no rashes, + hair loss Neurological: no tremors/numbness/tingling/dizziness  PE: BP 148/80 mmHg  Pulse 68  Temp(Src) 98.7 F (37.1 C) (Oral)  Resp 18  Wt 248 lb (112.492 kg)  SpO2 94% Body mass index is 34.6 kg/(m^2). Wt Readings from Last 3 Encounters:  08/21/14 248 lb (112.492 kg)  08/02/14 255 lb (115.667 kg)  06/11/14 255 lb (115.667 kg)   Constitutional: overweight, in NAD Eyes: PERRLA, EOMI, no exophthalmos ENT: moist mucous membranes, no thyromegaly, no cervical lymphadenopathy Cardiovascular: RRR, No MRG Respiratory: CTA B Gastrointestinal: abdomen soft,  NT, ND, BS+ Musculoskeletal: no deformities, strength intact in all 4 Skin: moist, warm, no rashes Neurological: no tremor with outstretched hands, DTR normal in all 4  ASSESSMENT: 1. DM2, insulin-dependent, uncontrolled, with complications - ED  PLAN:  1. Patient with long-standing, uncontrolled diabetes with higher sugars at last check. We added mealtime insulin with good improvement, but some highs later in the day >> will increase back Glipizide XL to 10 mg. He is skipping b'fast >> advised to try not to do so, especially as he is on insulin and Glipizide. Will switch from Toujeo to  Lantus (he does not like Toujeo, but did not give me a good explanation). - We discussed about options for treatment, and I suggested to:  Patient Instructions  Please stop Toujeo and start Lantus 20 units at bedtime Increase Glipizide XL to 10 mg in am Continue Metformin 1000 mg 2x a day.  Please come back for a follow-up appointment in 3 months.  Please stop at the lab.  - continue checking sugars at different times of the day - check 2 times a day, rotating checks - advised for yearly eye exams, he is up to date  - check Hba1c today - Return to clinic in 3 mo with sugar log   Office Visit on 08/21/2014  Component Date Value Ref Range Status  . Hgb A1c MFr Bld 08/21/2014 7.1* 4.6 - 6.5 % Final   Glycemic Control Guidelines for People with Diabetes:Non Diabetic:  <6%Goal of Therapy: <7%Additional Action Suggested:  >8%   Excellent HbA1c!

## 2014-08-22 DIAGNOSIS — M79673 Pain in unspecified foot: Secondary | ICD-10-CM

## 2014-08-23 ENCOUNTER — Other Ambulatory Visit: Payer: Self-pay | Admitting: *Deleted

## 2014-08-23 MED ORDER — INSULIN GLARGINE 100 UNIT/ML SOLOSTAR PEN: 20.0000 [IU] | PEN_INJECTOR | Freq: Every day | SUBCUTANEOUS | Status: DC

## 2014-08-23 NOTE — Telephone Encounter (Signed)
Fax from [facility] requesting 90 day supply per insurance. Sending 90 day supply of Lantus.

## 2014-08-26 ENCOUNTER — Other Ambulatory Visit: Payer: Self-pay | Admitting: Internal Medicine

## 2014-08-27 ENCOUNTER — Telehealth: Payer: Self-pay | Admitting: Internal Medicine

## 2014-08-27 MED ORDER — GLIPIZIDE ER 10 MG PO TB24: 10.0000 mg | ORAL_TABLET | Freq: Every day | ORAL | Status: DC

## 2014-08-27 NOTE — Telephone Encounter (Signed)
Patient called and would like a refill on his medication   Rx: Glipizide  Pharmacy: [facility]    Thank you

## 2014-08-27 NOTE — Telephone Encounter (Signed)
Done

## 2014-09-03 LAB — HM DIABETES EYE EXAM

## 2014-09-06 ENCOUNTER — Ambulatory Visit (INDEPENDENT_AMBULATORY_CARE_PROVIDER_SITE_OTHER): Payer: Federal, State, Local not specified - PPO | Admitting: Cardiovascular Disease

## 2014-09-06 ENCOUNTER — Encounter: Payer: Self-pay | Admitting: Cardiovascular Disease

## 2014-09-06 VITALS — BP 138/82 | HR 80 | Ht 71.0 in | Wt 248.0 lb

## 2014-09-06 DIAGNOSIS — E785 Hyperlipidemia, unspecified: Secondary | ICD-10-CM

## 2014-09-06 DIAGNOSIS — G4733 Obstructive sleep apnea (adult) (pediatric): Secondary | ICD-10-CM | POA: Insufficient documentation

## 2014-09-06 DIAGNOSIS — I10 Essential (primary) hypertension: Secondary | ICD-10-CM

## 2014-09-06 MED ORDER — ATORVASTATIN CALCIUM 40 MG PO TABS: 40.0000 mg | ORAL_TABLET | Freq: Every day | ORAL | Status: DC

## 2014-09-06 NOTE — Progress Notes (Signed)
09/06/2014 [name]   1951/03/13  [id number]  Primary Physician [name], MD Primary Cardiologist: [name] MD [name]   HPI:  The patient is a 64 year old moderately-overweight married African American male, father of 81 and 3 grandchildren, whom I last saw in the office 12 months ago. He has a history of normal coronary arteries by catheterization which I performed in 2004 with normal left ventricular function. His other problems include obstructive sleep apnea, on BiPAP and followed by Dr. Claiborne Billings, treated hypertension, dyslipidemia, and insulin-dependent diabetes. He denies chest pain or shortness of breath. A 2-D echo Myoview stress stress test performed March 2015 were normal. He denies chest pain or shortness of breath since I saw him a year ago.    Current Outpatient Prescriptions  Medication Sig Dispense Refill  . amLODipine (NORVASC) 10 MG tablet TAKE 1 TABLET (10 MG TOTAL) BY MOUTH DAILY. 180 tablet 1  . aspirin 81 MG EC tablet Take 81 mg by mouth once a week.      . Blood Glucose Monitoring Suppl (RELION CONFIRM GLUCOSE MONITOR) W/DEVICE KIT 1 Device by Does not apply route once. 1 kit 0  . glipiZIDE (GLIPIZIDE XL) 10 MG 24 hr tablet Take 1 tablet (10 mg total) by mouth daily with breakfast. 90 tablet 1  . glucose blood (RELION ULTIMA TEST) test strip 1 each by Other route 2 (two) times daily. And lancets 2/day 100 each 12  . Insulin Glargine (LANTUS SOLOSTAR) 100 UNIT/ML Solostar Pen Inject 20 Units into the skin daily at 10 pm. 10 pen 1  . Insulin Pen Needle (NOVOFINE PLUS) 32G X 4 MM MISC Use 1x a day 100 each 11  . losartan (COZAAR) 100 MG tablet Take 1 tablet (100 mg total) by mouth daily. 90 tablet 3  . metFORMIN (GLUCOPHAGE) 1000 MG tablet Take 1 tablet (1,000 mg total) by mouth 2 (two) times daily with a meal. 180 tablet 2  . omeprazole (PRILOSEC) 20 MG capsule Take 1 capsule (20 mg total) by mouth 2 (two) times daily before a meal.  180 capsule 3  . PROAIR HFA 108 (90 BASE) MCG/ACT inhaler   1   No current facility-administered medications for this visit.    Allergies  Allergen Reactions  . Statins     REACTION: joint pain Lipitor- headaches    History   Social History  . Marital Status: Married    Spouse Name: N/A  . Number of Children: 4  . Years of Education: N/A   Occupational History  . Retired Unemployed   Social History Main Topics  . Smoking status: Former Smoker -- 10 years    Types: Cigarettes    Quit date: 04/06/1983  . Smokeless tobacco: Never Used  . Alcohol Use: 0.0 oz/week    0 Standard drinks or equivalent per week     Comment: Occasional  . Drug Use: No  . Sexual Activity: Yes   Other Topics Concern  . Not on file   Social History Narrative   Regular Exercise -  NO     Review of Systems: General: negative for chills, fever, night sweats or weight changes.  Cardiovascular: negative for chest pain, dyspnea on exertion, edema, orthopnea, palpitations, paroxysmal nocturnal dyspnea or shortness of breath Dermatological: negative for rash Respiratory: negative for cough or wheezing Urologic: negative for hematuria Abdominal: negative for nausea, vomiting, diarrhea, bright red blood per rectum, melena, or hematemesis Neurologic: negative for visual changes, syncope, or dizziness  All other systems reviewed and are otherwise negative except as noted above.    Blood pressure 138/82, pulse 80, height _0  (1.803 m), weight 248 lb (112.492 kg).  General appearance: alert and no distress Neck: no adenopathy, no carotid bruit, no JVD, supple, symmetrical, trachea midline and thyroid not enlarged, symmetric, no tenderness/mass/nodules Lungs: clear to auscultation bilaterally Heart: regular rate and rhythm, S1, S2 normal, no murmur, click, rub or gallop Extremities: extremities normal, atraumatic, no cyanosis or edema  EKG normal sinus rhythm at 80 with septal Q waves and probably  limb lead reversal. I personally reviewed this EKG  ASSESSMENT AND PLAN:   Hyperlipidemia with target LDL less than 100 History of hyperlipidemia intolerant to statin drugs. I will have my pharmacist Cyril Mourning discuss with him PCSK9 monoclonal injectables   Essential hypertension, benign History of hypertension blood pressure measured at 138/82. He is on amlodipine and losartan. Continue current meds at current dosing   Obstructive sleep apnea History of obstructive sleep apnea on C Pap Which he benefits from       [name] MD [facility], [facility] 09/06/2014 2:05 PM

## 2014-09-06 NOTE — Assessment & Plan Note (Signed)
History of obstructive sleep apnea on C Pap Which he benefits from

## 2014-09-06 NOTE — Patient Instructions (Addendum)
Your physician wants you to follow-up in: 1 year with Dr Gwenlyn Found. You will receive a reminder letter in the mail two months in advance. If you don't receive a letter, please call our office to schedule the follow-up appointment.   Start Atorvastatin 40mg  once a week.

## 2014-09-06 NOTE — Assessment & Plan Note (Signed)
History of hypertension blood pressure measured at 138/82. He is on amlodipine and losartan. Continue current meds at current dosing

## 2014-09-06 NOTE — Assessment & Plan Note (Signed)
History of hyperlipidemia intolerant to statin drugs. I will have my pharmacist Cyril Mourning discuss with him PCSK9 monoclonal injectables

## 2014-09-30 ENCOUNTER — Ambulatory Visit (INDEPENDENT_AMBULATORY_CARE_PROVIDER_SITE_OTHER): Payer: Federal, State, Local not specified - PPO | Admitting: Cardiovascular Disease

## 2014-09-30 ENCOUNTER — Encounter: Payer: Self-pay | Admitting: Cardiovascular Disease

## 2014-09-30 VITALS — BP 160/70 | HR 70 | Ht 70.0 in | Wt 254.1 lb

## 2014-09-30 DIAGNOSIS — R079 Chest pain, unspecified: Secondary | ICD-10-CM

## 2014-09-30 DIAGNOSIS — I10 Essential (primary) hypertension: Secondary | ICD-10-CM

## 2014-09-30 DIAGNOSIS — R071 Chest pain on breathing: Secondary | ICD-10-CM | POA: Diagnosis not present

## 2014-09-30 MED ORDER — IBUPROFEN 600 MG PO TABS: ORAL_TABLET | ORAL | Status: DC

## 2014-09-30 NOTE — Assessment & Plan Note (Signed)
[name] presents today after last being seen approximately 3 weeks ago for new onset chest pain over the last several days.he has a history of clean coronary arteries by cath in 2004 with other problems that include hypertension, dyslipidemia and insulin dependent diabetes. His Myoview stress test performed March 2015 which was normal. He's had substernal chest pain for last 3-4 days which is somewhat pleuritic but also is associated with fatigue. I'm going to double his Prilosec to twice a day, add Motrin and obtain an exercise Myoview stress test. I will see him back after that for further evaluation.

## 2014-09-30 NOTE — Patient Instructions (Signed)
Medication Instructions:  INCREASE Omeprazole (Prilosec) - take 1 tablet (20 mg total) by mouth TWICE daily. START Motrin 600 mg - take 1 tablet (600 mg total) by mouth THREE times daily for 1 week. A new prescription has been sent to your pharmacy electronically.  Testing/Procedures: Your physician has requested that you have en exercise stress myoview. For further information please visit [email or web address]. Please follow instruction sheet, as given.  Follow-Up: Dr Gwenlyn Found recommends that you schedule a follow-up appointment in 1 week.

## 2014-09-30 NOTE — Progress Notes (Signed)
09/30/2014 [name]   07-18-1950  [id number]  Primary Physician Ronald Pao, MD Primary Cardiologist: Ronald Harp MD [name]   HPI: The patient is a 63 year old moderately-overweight married African American male, father of 73 and 3 grandchildren, whom I last saw in the office 3 weeks ago. He has a history of normal coronary arteries by catheterization which I performed in 2004 with normal left ventricular function. His other problems include obstructive sleep apnea, on BiPAP and followed by Dr. Claiborne Billings, treated hypertension, dyslipidemia, and insulin-dependent diabetes. A 2-D echo Myoview stress stress test performed March 2015 were normal. Since I saw him 3 weeks ago he developed substernal chest pain or last several days which is somewhat pleuritic without radiation. It occurs multiple times a day. Is also associated with fatigue. There is no radiation. It is worse with deep inspiration.   Current Outpatient Prescriptions  Medication Sig Dispense Refill  . amLODipine (NORVASC) 10 MG tablet TAKE 1 TABLET (10 MG TOTAL) BY MOUTH DAILY. 180 tablet 1  . aspirin 81 MG EC tablet Take 81 mg by mouth once a week.      Marland Kitchen atorvastatin (LIPITOR) 40 MG tablet Take 1 tablet (40 mg total) by mouth daily. 90 tablet 3  . Blood Glucose Monitoring Suppl (RELION CONFIRM GLUCOSE MONITOR) W/DEVICE KIT 1 Device by Does not apply route once. 1 kit 0  . glipiZIDE (GLIPIZIDE XL) 10 MG 24 hr tablet Take 1 tablet (10 mg total) by mouth daily with breakfast. 90 tablet 1  . glucose blood (RELION ULTIMA TEST) test strip 1 each by Other route 2 (two) times daily. And lancets 2/day 100 each 12  . Insulin Glargine (LANTUS SOLOSTAR) 100 UNIT/ML Solostar Pen Inject 20 Units into the skin daily at 10 pm. 10 pen 1  . Insulin Pen Needle (NOVOFINE PLUS) 32G X 4 MM MISC Use 1x a day 100 each 11  . losartan (COZAAR) 100 MG tablet Take 1 tablet (100 mg total) by mouth daily. 90 tablet 3  .  metFORMIN (GLUCOPHAGE) 1000 MG tablet Take 1 tablet (1,000 mg total) by mouth 2 (two) times daily with a meal. 180 tablet 2  . omeprazole (PRILOSEC) 20 MG capsule Take 1 capsule (20 mg total) by mouth 2 (two) times daily before a meal. 180 capsule 3  . PROAIR HFA 108 (90 BASE) MCG/ACT inhaler   1  . ibuprofen (ADVIL,MOTRIN) 600 MG tablet Take 1 tablet (600 mg total) by mouth three times daily x1 week 21 tablet 0  . latanoprost (XALATAN) 0.005 % ophthalmic solution Place 1 drop into both eyes 2 (two) times daily.  5   No current facility-administered medications for this visit.    Allergies  Allergen Reactions  . Statins     REACTION: joint pain Lipitor- headaches Has also tried Livalo, pravachol, zetia, crestor, welchol    History   Social History  . Marital Status: Married    Spouse Name: N/A  . Number of Children: 4  . Years of Education: N/A   Occupational History  . Retired Unemployed   Social History Main Topics  . Smoking status: Former Smoker -- 10 years    Types: Cigarettes    Quit date: 04/06/1983  . Smokeless tobacco: Never Used  . Alcohol Use: 0.0 oz/week    0 Standard drinks or equivalent per week     Comment: Occasional  . Drug Use: No  . Sexual Activity: Yes   Other Topics Concern  .  Not on file   Social History Narrative   Regular Exercise -  NO     Review of Systems: General: negative for chills, fever, night sweats or weight changes.  Cardiovascular: negative for chest pain, dyspnea on exertion, edema, orthopnea, palpitations, paroxysmal nocturnal dyspnea or shortness of breath Dermatological: negative for rash Respiratory: negative for cough or wheezing Urologic: negative for hematuria Abdominal: negative for nausea, vomiting, diarrhea, bright red blood per rectum, melena, or hematemesis Neurologic: negative for visual changes, syncope, or dizziness All other systems reviewed and are otherwise negative except as noted above.    Blood  pressure 160/70, pulse 70, height 5' 10"  (1.778 m), weight 254 lb 1.6 oz (115.259 kg).  General appearance: alert and no distress Neck: no adenopathy, no carotid bruit, no JVD, supple, symmetrical, trachea midline and thyroid not enlarged, symmetric, no tenderness/mass/nodules Lungs: clear to auscultation bilaterally Heart: regular rate and rhythm, S1, S2 normal, no murmur, click, rub or gallop Extremities: extremities normal, atraumatic, no cyanosis or edema  EKG normal sinus rhythm at 70 without ST or T-wave changes. I personally reviewed this EKG  ASSESSMENT AND PLAN:   Chest pain Mr. Pheasant presents today after last being seen approximately 3 weeks ago for new onset chest pain over the last several days.he has a history of clean coronary arteries by cath in 2004 with other problems that include hypertension, dyslipidemia and insulin dependent diabetes. His Myoview stress test performed March 2015 which was normal. He's had substernal chest pain for last 3-4 days which is somewhat pleuritic but also is associated with fatigue. I'm going to double his Prilosec to twice a day, add Motrin and obtain an exercise Myoview stress test. I will see him back after that for further evaluation.      Ronald Harp MD FACP,FACC,FAHA, [facility] 09/30/2014 2:54 PM

## 2014-10-03 ENCOUNTER — Telehealth (HOSPITAL_COMMUNITY): Payer: Self-pay

## 2014-10-03 NOTE — Telephone Encounter (Signed)
Encounter complete. 

## 2014-10-04 ENCOUNTER — Telehealth (HOSPITAL_COMMUNITY): Payer: Self-pay

## 2014-10-04 NOTE — Telephone Encounter (Signed)
Encounter complete. 

## 2014-10-08 ENCOUNTER — Ambulatory Visit (HOSPITAL_COMMUNITY)
Admission: RE | Admit: 2014-10-08 | Discharge: 2014-10-08 | Disposition: A | Discharge status: Home / Self Care | Payer: Federal, State, Local not specified - PPO | Source: Ambulatory Visit | Attending: Cardiovascular Disease | Admitting: Cardiovascular Disease

## 2014-10-08 DIAGNOSIS — R079 Chest pain, unspecified: Secondary | ICD-10-CM

## 2014-10-08 DIAGNOSIS — R9439 Abnormal result of other cardiovascular function study: Secondary | ICD-10-CM | POA: Diagnosis not present

## 2014-10-08 LAB — MYOCARDIAL PERFUSION IMAGING
CHL CUP MPHR: 156 {beats}/min
CHL RATE OF PERCEIVED EXERTION: 15
CSEPED: 8 min
Estimated workload: 10.1 METS
Peak HR: 148 {beats}/min
Percent HR: 94 %
Rest HR: 70 {beats}/min
SDS: 2
SRS: 0
SSS: 2
TID: 1.1

## 2014-10-08 MED ORDER — TECHNETIUM TC 99M SESTAMIBI GENERIC - CARDIOLITE
10.8000 | Freq: Once | INTRAVENOUS | Status: AC | PRN
  Administered 2014-10-08: 11 via INTRAVENOUS

## 2014-10-08 MED ORDER — TECHNETIUM TC 99M SESTAMIBI GENERIC - CARDIOLITE
31.0000 | Freq: Once | INTRAVENOUS | Status: AC | PRN
  Administered 2014-10-08: 31 via INTRAVENOUS

## 2014-10-09 ENCOUNTER — Ambulatory Visit: Payer: Federal, State, Local not specified - PPO | Admitting: Cardiovascular Disease

## 2014-10-14 ENCOUNTER — Telehealth: Payer: Self-pay | Admitting: Cardiovascular Disease

## 2014-10-14 ENCOUNTER — Telehealth: Payer: Self-pay | Admitting: Gastroenterology

## 2014-10-14 MED ORDER — ESOMEPRAZOLE MAGNESIUM 40 MG PO CPDR: 40.0000 mg | DELAYED_RELEASE_CAPSULE | Freq: Every day | ORAL | Status: DC

## 2014-10-14 NOTE — Telephone Encounter (Signed)
Pt wanted to try Nexium the omeprazole is not working, he was also told to follow anti reflux measures and call back if he gets no relief from the Nexium

## 2014-10-14 NOTE — Telephone Encounter (Signed)
Pt said he had chest pressure last night,it last for 8 to 9 minutes. He wanted to know if he could get lab work this morning to see if pressure is heart relaxed. It might be something else,just wanted to be sure.

## 2014-10-14 NOTE — Telephone Encounter (Signed)
There was a small (2cm) hiatal hernia.

## 2014-10-14 NOTE — Telephone Encounter (Signed)
Pt. States that he starting having a lot of gas and belching when he was working out last night .pt instructed he would need to go to the ER if pain returned and if it did not stop or increased in nature or if the pian was different pt. Stated understanding; pt. Also stated that he he didn't think that his prilosec was working, pt. Instructed to see Dr. Osborne Casco or wait and see Dr. Gwenlyn Found about that on his up coming visit, pt. Agreed with plan

## 2014-10-24 ENCOUNTER — Ambulatory Visit: Payer: Federal, State, Local not specified - PPO | Admitting: Cardiovascular Disease

## 2014-11-01 ENCOUNTER — Ambulatory Visit: Payer: Federal, State, Local not specified - PPO | Admitting: Cardiovascular Disease

## 2014-11-05 ENCOUNTER — Encounter (HOSPITAL_COMMUNITY): Payer: Self-pay | Admitting: Emergency Medicine

## 2014-11-05 ENCOUNTER — Inpatient Hospital Stay (HOSPITAL_COMMUNITY)
Admission: EM | Admit: 2014-11-05 | Discharge: 2014-11-07 | DRG: 247 | Disposition: A | Discharge status: Home / Self Care | Payer: Federal, State, Local not specified - PPO | Attending: Internal Medicine | Admitting: Internal Medicine

## 2014-11-05 ENCOUNTER — Emergency Department (HOSPITAL_COMMUNITY): Payer: Federal, State, Local not specified - PPO

## 2014-11-05 DIAGNOSIS — R079 Chest pain, unspecified: Secondary | ICD-10-CM

## 2014-11-05 DIAGNOSIS — Z79899 Other long term (current) drug therapy: Secondary | ICD-10-CM

## 2014-11-05 DIAGNOSIS — I25110 Atherosclerotic heart disease of native coronary artery with unstable angina pectoris: Secondary | ICD-10-CM | POA: Diagnosis present

## 2014-11-05 DIAGNOSIS — E119 Type 2 diabetes mellitus without complications: Secondary | ICD-10-CM | POA: Diagnosis present

## 2014-11-05 DIAGNOSIS — Z888 Allergy status to other drugs, medicaments and biological substances status: Secondary | ICD-10-CM | POA: Diagnosis not present

## 2014-11-05 DIAGNOSIS — Z6835 Body mass index (BMI) 35.0-35.9, adult: Secondary | ICD-10-CM | POA: Diagnosis not present

## 2014-11-05 DIAGNOSIS — Z87891 Personal history of nicotine dependence: Secondary | ICD-10-CM

## 2014-11-05 DIAGNOSIS — F0280 Dementia in other diseases classified elsewhere without behavioral disturbance: Secondary | ICD-10-CM | POA: Diagnosis present

## 2014-11-05 DIAGNOSIS — E785 Hyperlipidemia, unspecified: Secondary | ICD-10-CM | POA: Diagnosis present

## 2014-11-05 DIAGNOSIS — E669 Obesity, unspecified: Secondary | ICD-10-CM | POA: Diagnosis present

## 2014-11-05 DIAGNOSIS — Z7982 Long term (current) use of aspirin: Secondary | ICD-10-CM

## 2014-11-05 DIAGNOSIS — Z8249 Family history of ischemic heart disease and other diseases of the circulatory system: Secondary | ICD-10-CM | POA: Diagnosis not present

## 2014-11-05 DIAGNOSIS — G4733 Obstructive sleep apnea (adult) (pediatric): Secondary | ICD-10-CM | POA: Diagnosis present

## 2014-11-05 DIAGNOSIS — M1990 Unspecified osteoarthritis, unspecified site: Secondary | ICD-10-CM | POA: Diagnosis present

## 2014-11-05 DIAGNOSIS — Z833 Family history of diabetes mellitus: Secondary | ICD-10-CM

## 2014-11-05 DIAGNOSIS — Z955 Presence of coronary angioplasty implant and graft: Secondary | ICD-10-CM

## 2014-11-05 DIAGNOSIS — I10 Essential (primary) hypertension: Secondary | ICD-10-CM | POA: Diagnosis present

## 2014-11-05 DIAGNOSIS — F329 Major depressive disorder, single episode, unspecified: Secondary | ICD-10-CM | POA: Diagnosis present

## 2014-11-05 DIAGNOSIS — E1159 Type 2 diabetes mellitus with other circulatory complications: Secondary | ICD-10-CM | POA: Diagnosis present

## 2014-11-05 DIAGNOSIS — N179 Acute kidney failure, unspecified: Secondary | ICD-10-CM | POA: Diagnosis present

## 2014-11-05 DIAGNOSIS — I2510 Atherosclerotic heart disease of native coronary artery without angina pectoris: Secondary | ICD-10-CM | POA: Insufficient documentation

## 2014-11-05 DIAGNOSIS — I214 Non-ST elevation (NSTEMI) myocardial infarction: Principal | ICD-10-CM | POA: Diagnosis present

## 2014-11-05 DIAGNOSIS — I25118 Atherosclerotic heart disease of native coronary artery with other forms of angina pectoris: Secondary | ICD-10-CM | POA: Diagnosis not present

## 2014-11-05 DIAGNOSIS — Z794 Long term (current) use of insulin: Secondary | ICD-10-CM

## 2014-11-05 DIAGNOSIS — F0393 Unspecified dementia, unspecified severity, with mood disturbance: Secondary | ICD-10-CM | POA: Diagnosis present

## 2014-11-05 DIAGNOSIS — K219 Gastro-esophageal reflux disease without esophagitis: Secondary | ICD-10-CM | POA: Diagnosis present

## 2014-11-05 HISTORY — DX: Procedure and treatment not carried out because of patient's decision for reasons of belief and group pressure: Z53.1

## 2014-11-05 HISTORY — DX: Reserved for inherently not codable concepts without codable children: IMO0001

## 2014-11-05 LAB — CBC WITH DIFFERENTIAL/PLATELET
Basophils Absolute: 0 10*3/uL (ref 0.0–0.1)
Basophils Relative: 1 % (ref 0–1)
Eosinophils Absolute: 0 10*3/uL (ref 0.0–0.7)
Eosinophils Relative: 1 % (ref 0–5)
HCT: 42.5 % (ref 39.0–52.0)
HEMOGLOBIN: 14.1 g/dL (ref 13.0–17.0)
LYMPHS ABS: 1.3 10*3/uL (ref 0.7–4.0)
Lymphocytes Relative: 15 % (ref 12–46)
MCH: 28 pg (ref 26.0–34.0)
MCHC: 33.2 g/dL (ref 30.0–36.0)
MCV: 84.5 fL (ref 78.0–100.0)
MONO ABS: 0.6 10*3/uL (ref 0.1–1.0)
Monocytes Relative: 7 % (ref 3–12)
NEUTROS PCT: 76 % (ref 43–77)
Neutro Abs: 6.7 10*3/uL (ref 1.7–7.7)
Platelets: 175 10*3/uL (ref 150–400)
RBC: 5.03 MIL/uL (ref 4.22–5.81)
RDW: 14.2 % (ref 11.5–15.5)
WBC: 8.7 10*3/uL (ref 4.0–10.5)

## 2014-11-05 LAB — BASIC METABOLIC PANEL
ANION GAP: 10 (ref 5–15)
BUN: 14 mg/dL (ref 6–20)
CO2: 28 mmol/L (ref 22–32)
CREATININE: 1.48 mg/dL — AB (ref 0.61–1.24)
Calcium: 9.4 mg/dL (ref 8.9–10.3)
Chloride: 103 mmol/L (ref 101–111)
GFR calc Af Amer: 56 mL/min — ABNORMAL LOW (ref >60)
GFR, EST NON AFRICAN AMERICAN: 48 mL/min — AB (ref >60)
Glucose, Bld: 192 mg/dL — ABNORMAL HIGH (ref 65–99)
Potassium: 4.1 mmol/L (ref 3.5–5.1)
Sodium: 141 mmol/L (ref 135–145)

## 2014-11-05 LAB — I-STAT TROPONIN, ED: TROPONIN I, POC: 0.08 ng/mL (ref 0.00–0.08)

## 2014-11-05 LAB — TROPONIN I: Troponin I: 0.07 ng/mL — ABNORMAL HIGH (ref <0.031)

## 2014-11-05 NOTE — ED Provider Notes (Signed)
CSN: [id number]     Arrival date & time 11/05/14  1830 History   First MD Initiated Contact with Patient 11/05/14 2033     Chief Complaint  Patient presents with  . Chest Pain     HPI Patient presents with exertional chest pain and shortness of breath for the last few days.  Some nausea but no vomiting.  Note pain at present time.  Patient has risk factors of high cholesterol, diabetes, hypertension.  Had a normal stress test done on July 5. Past Medical History  Diagnosis Date  . Anemia   . Type II or unspecified type diabetes mellitus without mention of complication, not stated as uncontrolled   . GERD (gastroesophageal reflux disease)   . Hyperlipidemia   . HTN (hypertension)   . Osteoarthritis   . Depression   . Adenomatous colon polyp 2011  . OSA on CPAP   . Sleep apnea     CPAP  . Chest pain     pleuritic   Past Surgical History  Procedure Laterality Date  . Pilonidal cyst excision    . Shoulder surgery      Right  . Cardiac catheterization  2004    patent coronary arteries  . Colonoscopy    . Polypectomy     Family History  Problem Relation Age of Onset  . Coronary artery disease Mother   . Stroke Mother   . Diabetes Maternal Uncle     x2  . Alcohol abuse Other   . Coronary artery disease Other     CABG  . Heart disease Mother   . Diabetes Mother   . Diabetes Sister   . Diabetes Paternal Uncle     x2  . Colon cancer Paternal Uncle   . Colon polyps Neg Hx   . Esophageal cancer Neg Hx    History  Substance Use Topics  . Smoking status: Former Smoker -- 10 years    Types: Cigarettes    Quit date: 04/06/1983  . Smokeless tobacco: Never Used  . Alcohol Use: 0.0 oz/week    0 Standard drinks or equivalent per week     Comment: Occasional    Review of Systems  All other systems reviewed and are negative  Allergies  Statins  Home Medications   Prior to Admission medications   Medication Sig Start Date End Date Taking? Authorizing Provider   amLODipine (NORVASC) 10 MG tablet TAKE 1 TABLET (10 MG TOTAL) BY MOUTH DAILY. 04/19/14  Yes [name], MD  aspirin 81 MG EC tablet Take 81 mg by mouth daily.    Yes Historical Provider, MD  atorvastatin (LIPITOR) 40 MG tablet Take 1 tablet (40 mg total) by mouth daily. 09/06/14  Yes [name], MD  esomeprazole (NEXIUM) 40 MG capsule Take 1 capsule (40 mg total) by mouth daily at 12 noon. 10/14/14  Yes Milus Banister, MD  glipiZIDE (GLIPIZIDE XL) 10 MG 24 hr tablet Take 1 tablet (10 mg total) by mouth daily with breakfast. 08/27/14  Yes [name], MD  Insulin Glargine (LANTUS SOLOSTAR) 100 UNIT/ML Solostar Pen Inject 20 Units into the skin daily at 10 pm. 08/23/14  Yes [name], MD  latanoprost (XALATAN) 0.005 % ophthalmic solution Place 1 drop into both eyes 2 (two) times daily. 09/19/14  Yes Historical Provider, MD  losartan (COZAAR) 100 MG tablet Take 1 tablet (100 mg total) by mouth daily. 01/07/14  Yes Janith Lima, MD  metFORMIN (GLUCOPHAGE) 1000 MG tablet  Take 1 tablet (1,000 mg total) by mouth 2 (two) times daily with a meal. 08/17/13  Yes [name], MD  Blood Glucose Monitoring Suppl (RELION CONFIRM GLUCOSE MONITOR) W/DEVICE KIT 1 Device by Does not apply route once. 05/01/12   Renato Shin, MD  glucose blood (RELION ULTIMA TEST) test strip 1 each by Other route 2 (two) times daily. And lancets 2/day 05/01/12   Renato Shin, MD  ibuprofen (ADVIL,MOTRIN) 600 MG tablet Take 1 tablet (600 mg total) by mouth three times daily x1 week Patient not taking: Reported on 11/05/2014 09/30/14   [name], MD  Insulin Pen Needle (NOVOFINE PLUS) 32G X 4 MM MISC Use 1x a day 05/09/14   [name], MD  omeprazole (PRILOSEC) 20 MG capsule Take 1 capsule (20 mg total) by mouth 2 (two) times daily before a meal. Patient not taking: Reported on 11/05/2014 08/02/14   Milus Banister, MD  PROAIR HFA 108 (90 BASE) MCG/ACT inhaler Inhale 1 puff into the lungs every 6 (six) hours as  needed for wheezing.  08/22/14   Historical Provider, MD   BP 161/78 mmHg  Pulse 66  Temp(Src) 98.1 F (36.7 C) (Oral)  Resp 15  Wt 251 lb (113.853 kg)  SpO2 97% Physical Exam Physical Exam  Nursing note and vitals reviewed. Constitutional: He is oriented to person, place, and time. He appears well-developed and well-nourished. No distress.  HENT:  Head: Normocephalic and atraumatic.  Eyes: Pupils are equal, round, and reactive to light.  Neck: Normal range of motion.  Cardiovascular: Normal rate and intact distal pulses.   Pulmonary/Chest: No respiratory distress.  Abdominal: Normal appearance. He exhibits no distension.  Musculoskeletal: Normal range of motion.  Neurological: He is alert and oriented to person, place, and time. No cranial nerve deficit.  Skin: Skin is warm and dry. No rash noted.  Psychiatric: He has a normal mood and affect. His behavior is normal.   ED Course  Procedures (including critical care time) Medications  0.9% sodium chloride infusion (0 mL/kg/hr  111.7 kg Intravenous Stopped 11/06/14 2130)  heparin bolus via infusion 4,000 Units (4,000 Units Intravenous Given 11/06/14 1119)    Labs Review Labs Reviewed  BASIC METABOLIC PANEL - Abnormal; Notable for the following:    Glucose, Bld 192 (*)    Creatinine, Ser 1.48 (*)    GFR calc non Af Amer 48 (*)    GFR calc Af Amer 56 (*)    All other components within normal limits  TROPONIN I - Abnormal; Notable for the following:    Troponin I 0.07 (*)    All other components within normal limits  CBC WITH DIFFERENTIAL/PLATELET  TROPONIN I  TROPONIN I  I-STAT TROPOININ, ED    Imaging Review Dg Chest 2 View  11/05/2014   CLINICAL DATA:  Fatigue, epigastric pain in shortness of breath all day today.  EXAM: CHEST  2 VIEW  COMPARISON:  12/13/2013  FINDINGS: The cardiac silhouette, mediastinal and hilar contours are within normal limits and stable. The lungs are clear. No pleural effusion. The bony thorax is  intact.  IMPRESSION: No acute cardiopulmonary findings.   Electronically Signed   By: Marijo Sanes M.D.   On: 11/05/2014 19:46     EKG Interpretation   Date/Time:  Tuesday November 05 2014 18:52:01 EDT Ventricular Rate:  98 PR Interval:  196 QRS Duration: 102 QT Interval:  366 QTC Calculation: 467 R Axis:   14 Text Interpretation:  Sinus rhythm with marked  sinus arrhythmia with  occasional Premature ventricular complexes Nonspecific T wave abnormality  Prolonged QT Abnormal ECG No old tracing to compare Confirmed by [facility]   MD, DAVID ([id number]) on 11/05/2014 6:55:00 PM     I discussed with cardiology who saw the patient emergency room requested be admitted to the hospitalist service. MDM   Final diagnoses:  Chest pain, unspecified chest pain type  AKI (acute kidney injury)        Leonard Schwartz, MD 11/12/14 0700

## 2014-11-05 NOTE — ED Notes (Signed)
Pt sts diaphoresis with CP and SOB with exertion x several days worse today; pt denies pain at present

## 2014-11-05 NOTE — Consult Note (Addendum)
Referring Physician: Dr. Audie Pinto Primary Physician: Primary Cardiologist: Dr. Gwenlyn Found Reason for Consultation: positive troponin   HPI: 64 yo man with DM, HTN, HLD, obesity, OSA and chest pain who presents to ED with chest pain earlier today.  He was recently seen by Dr. Gwenlyn Found for similar pain and evaluated with an exercise MPI stress test on 10/2014.  He achieved 94% MPHR and 10 METS with SOB but no CP.  No ischemic ECG change and imaging showed a fixed inferior defect suggestive of diaphragmatic attenuation vs inferior infarct but no inducible ischemia.  Today he reports that he was working outside and moving things around.  He noticed he was drenched in sweat but states it was hot out.  He initially developed "indigestion" described as "belching".  Subsequently noted central chest pressure associated with difficulty catching his breath.  Whole episode lasted about 30 minutes.  No alleviating or precipitating factors.  No other associated symptoms. Reports occasional pedal edema and chronic fatigue.  Currently CP free and feeling well.  Initial iSTAT troponin of 0.07 rechecked as a regular troponin that resulted at 0.08 prompting cardiology consultation.   Of note, he has AKI with Cr 1.48, last checked in Feb 1.08     Review of Systems:     Cardiac Review of Systems: {Y] = yes [ ]  = no  Chest Pain [ x   ]  Resting SOB [x   ] Exertional SOB  [  ]  Orthopnea [  ]   Pedal Edema [x   ]    Palpitations [  ] Syncope  [  ]   Presyncope [   ]  General Review of Systems: [Y] = yes [  ]=no Constitional: recent weight change [  ]; anorexia [  ]; fatigue [  ]; nausea [  ]; night sweats [  ]; fever [  ]; or chills [  ];                                                                      Eyes : blurred vision [  ]; diplopia [   ]; vision changes [  ];  Amaurosis fugax[  ]; Resp: cough [  ];  wheezing[  ];  hemoptysis[  ];  PND [  ];  GI:  gallstones[  ], vomiting[  ];  dysphagia[  ]; melena[  ];   hematochezia [  ]; heartburn[  ];   GU: kidney stones [  ]; hematuria[  ];   dysuria [  ];  nocturia[  ]; incontinence [  ];             Skin: rash, swelling[  ];, hair loss[  ];  peripheral edema[x  ];  or itching[  ]; Musculosketetal: myalgias[  ];  joint swelling[  ];  joint erythema[  ];  joint pain[  ];  back pain[  ];  Heme/Lymph: bruising[  ];  bleeding[  ];  anemia[  ];  Neuro: TIA[  ];  headaches[  ];  stroke[  ];  vertigo[  ];  seizures[  ];   paresthesias[  ];  difficulty walking[  ];  Psych:depression[  ]; anxiety[  ];  Endocrine: diabetes[x  ];  thyroid dysfunction[  ];  Other:  Past Medical History  Diagnosis Date  . Anemia   . Type II or unspecified type diabetes mellitus without mention of complication, not stated as uncontrolled   . GERD (gastroesophageal reflux disease)   . Hyperlipidemia   . HTN (hypertension)   . Osteoarthritis   . Depression   . Adenomatous colon polyp 2011  . OSA on CPAP   . Sleep apnea     CPAP  . Chest pain     pleuritic     (Not in a hospital admission)     Infusions:   Allergies  Allergen Reactions  . Statins     REACTION: joint pain Lipitor- headaches Has also tried Livalo, pravachol, zetia, crestor, welchol    History   Social History  . Marital Status: Married    Spouse Name: N/A  . Number of Children: 4  . Years of Education: N/A   Occupational History  . Retired Unemployed   Social History Main Topics  . Smoking status: Former Smoker -- 10 years    Types: Cigarettes    Quit date: 04/06/1983  . Smokeless tobacco: Never Used  . Alcohol Use: 0.0 oz/week    0 Standard drinks or equivalent per week     Comment: Occasional  . Drug Use: No  . Sexual Activity: Yes   Other Topics Concern  . Not on file   Social History Narrative   Regular Exercise -  NO    Family History  Problem Relation Age of Onset  . Coronary artery disease Mother   . Stroke Mother   . Diabetes Maternal Uncle     x2  . Alcohol  abuse Other   . Coronary artery disease Other     CABG  . Heart disease Mother   . Diabetes Mother   . Diabetes Sister   . Diabetes Paternal Uncle     x2  . Colon cancer Paternal Uncle   . Colon polyps Neg Hx   . Esophageal cancer Neg Hx     PHYSICAL EXAM: Filed Vitals:   11/05/14 2300  BP: 161/78  Pulse: 66  Temp:   Resp: 15    No intake or output data in the 24 hours ending 11/05/14 2356  General:  Well appearing. No respiratory difficulty HEENT: PERRL EOMI anicteric Neck: supple. no JVD. Carotids 2+ bilat; no bruits. No lymphadenopathy or thryomegaly appreciated. Cor: PMI nondisplaced. Regular rate & rhythm. No rubs, gallops or murmurs. Lungs: clear Abdomen: soft, nontender, nondistended. No hepatosplenomegaly. No bruits or masses. Good bowel sounds. Extremities: no cyanosis, clubbing, rash, edema Neuro: alert & oriented x 3, cranial nerves grossly intact. moves all 4 extremities w/o difficulty. Affect pleasant.  ECG: SR with PVC and PACs, NSSTT  Results for orders placed or performed during the hospital encounter of 11/05/14 (from the past 24 hour(s))  Basic metabolic panel     Status: Abnormal   Collection Time: 11/05/14  7:06 PM  Result Value Ref Range   Sodium 141 135 - 145 mmol/L   Potassium 4.1 3.5 - 5.1 mmol/L   Chloride 103 101 - 111 mmol/L   CO2 28 22 - 32 mmol/L   Glucose, Bld 192 (H) 65 - 99 mg/dL   BUN 14 6 - 20 mg/dL   Creatinine, Ser 1.48 (H) 0.61 - 1.24 mg/dL   Calcium 9.4 8.9 - 10.3 mg/dL   GFR calc non Af Amer 48 (L) >60 mL/min   GFR calc Af  Amer 56 (L) >60 mL/min   Anion gap 10 5 - 15  CBC with Differential     Status: None   Collection Time: 11/05/14  7:06 PM  Result Value Ref Range   WBC 8.7 4.0 - 10.5 K/uL   RBC 5.03 4.22 - 5.81 MIL/uL   Hemoglobin 14.1 13.0 - 17.0 g/dL   HCT 42.5 39.0 - 52.0 %   MCV 84.5 78.0 - 100.0 fL   MCH 28.0 26.0 - 34.0 pg   MCHC 33.2 30.0 - 36.0 g/dL   RDW 14.2 11.5 - 15.5 %   Platelets 175 150 - 400 K/uL    Neutrophils Relative % 76 43 - 77 %   Neutro Abs 6.7 1.7 - 7.7 K/uL   Lymphocytes Relative 15 12 - 46 %   Lymphs Abs 1.3 0.7 - 4.0 K/uL   Monocytes Relative 7 3 - 12 %   Monocytes Absolute 0.6 0.1 - 1.0 K/uL   Eosinophils Relative 1 0 - 5 %   Eosinophils Absolute 0.0 0.0 - 0.7 K/uL   Basophils Relative 1 0 - 1 %   Basophils Absolute 0.0 0.0 - 0.1 K/uL  Troponin I     Status: Abnormal   Collection Time: 11/05/14  7:06 PM  Result Value Ref Range   Troponin I 0.07 (H) <0.031 ng/mL  I-stat troponin, ED     Status: None   Collection Time: 11/05/14  7:18 PM  Result Value Ref Range   Troponin i, poc 0.08 0.00 - 0.08 ng/mL   Comment 3           Dg Chest 2 View  11/05/2014   CLINICAL DATA:  Fatigue, epigastric pain in shortness of breath all day today.  EXAM: CHEST  2 VIEW  COMPARISON:  12/13/2013  FINDINGS: The cardiac silhouette, mediastinal and hilar contours are within normal limits and stable. The lungs are clear. No pleural effusion. The bony thorax is intact.  IMPRESSION: No acute cardiopulmonary findings.   Electronically Signed   By: Marijo Sanes M.D.   On: 11/05/2014 19:46     ASSESSMENT: 64 yo man with multiple cardiac risk factors presenting with chest pain.  His story is concerning for angina, although he states it is somewhat similar to previous episodes including recent visit prompting stress test.  His ECG does not show any acute changes and troponin in minimally elevated.  Furthermore, he has AKI with a Cr 1.48 up from 1.08 in Feb 2016.  He reports working outside today and being "drenched in sweat" but BUN/Cr of about 10 which does not suggest pre-renal state and has risk factors for other etiologies.    PLAN/DISCUSSION: Continue ASA and statin (can consider increasing given risk factors and recent lipid profile) Start BB given risk factors, h/o chest pain and current, mildly elevated troponin and BP Cycle troponin to peak AKI work up by medicine DM per medicine Can  consider heparin while cycling troponins Further recommendations pending above results.  Keep NPO after MN in case further testing required, note recent stress mentioned above.  If further ischemic eval warranted, would consider cath once AKI etiology identified/addressed.

## 2014-11-06 ENCOUNTER — Encounter (HOSPITAL_COMMUNITY): Payer: Self-pay | Admitting: General Practice

## 2014-11-06 ENCOUNTER — Encounter (HOSPITAL_COMMUNITY)
Admission: EM | Disposition: A | Discharge status: Home / Self Care | Payer: Federal, State, Local not specified - PPO | Source: Home / Self Care | Attending: Nephrology

## 2014-11-06 DIAGNOSIS — F329 Major depressive disorder, single episode, unspecified: Secondary | ICD-10-CM

## 2014-11-06 DIAGNOSIS — I10 Essential (primary) hypertension: Secondary | ICD-10-CM

## 2014-11-06 DIAGNOSIS — F0280 Dementia in other diseases classified elsewhere without behavioral disturbance: Secondary | ICD-10-CM

## 2014-11-06 DIAGNOSIS — E118 Type 2 diabetes mellitus with unspecified complications: Secondary | ICD-10-CM

## 2014-11-06 DIAGNOSIS — I2510 Atherosclerotic heart disease of native coronary artery without angina pectoris: Secondary | ICD-10-CM | POA: Insufficient documentation

## 2014-11-06 DIAGNOSIS — N179 Acute kidney failure, unspecified: Secondary | ICD-10-CM

## 2014-11-06 DIAGNOSIS — R079 Chest pain, unspecified: Secondary | ICD-10-CM

## 2014-11-06 DIAGNOSIS — I25118 Atherosclerotic heart disease of native coronary artery with other forms of angina pectoris: Secondary | ICD-10-CM

## 2014-11-06 DIAGNOSIS — I214 Non-ST elevation (NSTEMI) myocardial infarction: Principal | ICD-10-CM

## 2014-11-06 HISTORY — PX: CARDIAC CATHETERIZATION: SHX172

## 2014-11-06 LAB — BASIC METABOLIC PANEL
Anion gap: 7 (ref 5–15)
BUN: 12 mg/dL (ref 6–20)
CO2: 27 mmol/L (ref 22–32)
CREATININE: 0.9 mg/dL (ref 0.61–1.24)
Calcium: 8.8 mg/dL — ABNORMAL LOW (ref 8.9–10.3)
Chloride: 104 mmol/L (ref 101–111)
GFR calc non Af Amer: >60 mL/min (ref >60)
Glucose, Bld: 126 mg/dL — ABNORMAL HIGH (ref 65–99)
POTASSIUM: 3.8 mmol/L (ref 3.5–5.1)
Sodium: 138 mmol/L (ref 135–145)

## 2014-11-06 LAB — GLUCOSE, CAPILLARY
GLUCOSE-CAPILLARY: 112 mg/dL — AB (ref 65–99)
Glucose-Capillary: 119 mg/dL — ABNORMAL HIGH (ref 65–99)
Glucose-Capillary: 109 mg/dL — ABNORMAL HIGH (ref 65–99)
Glucose-Capillary: 140 mg/dL — ABNORMAL HIGH (ref 65–99)

## 2014-11-06 LAB — CBC
HCT: 41.8 % (ref 39.0–52.0)
Hemoglobin: 13.9 g/dL (ref 13.0–17.0)
MCH: 28.5 pg (ref 26.0–34.0)
MCHC: 33.3 g/dL (ref 30.0–36.0)
MCV: 85.7 fL (ref 78.0–100.0)
Platelets: 219 10*3/uL (ref 150–400)
RBC: 4.88 MIL/uL (ref 4.22–5.81)
RDW: 14.5 % (ref 11.5–15.5)
WBC: 8.5 10*3/uL (ref 4.0–10.5)

## 2014-11-06 LAB — PROTIME-INR
INR: 1.07 (ref 0.00–1.49)
PROTHROMBIN TIME: 14.1 s (ref 11.6–15.2)

## 2014-11-06 LAB — POCT ACTIVATED CLOTTING TIME: ACTIVATED CLOTTING TIME: 466 s

## 2014-11-06 LAB — TROPONIN I
Troponin I: 0.62 ng/mL (ref <0.031)
Troponin I: 0.47 ng/mL — ABNORMAL HIGH (ref <0.031)
Troponin I: 0.25 ng/mL — ABNORMAL HIGH (ref <0.031)

## 2014-11-06 SURGERY — LEFT HEART CATH AND CORONARY ANGIOGRAPHY

## 2014-11-06 MED ORDER — METOPROLOL TARTRATE 25 MG PO TABS
25.0000 mg | ORAL_TABLET | Freq: Two times a day (BID) | ORAL | Status: DC
  Administered 2014-11-06 – 2014-11-07 (×4): 25 mg via ORAL
  Filled 2014-11-06 (×5): qty 1

## 2014-11-06 MED ORDER — SODIUM CHLORIDE 0.9 % IV SOLN
INTRAVENOUS | Status: DC
  Administered 2014-11-06: 11:00:00 via INTRAVENOUS

## 2014-11-06 MED ORDER — BIVALIRUDIN 250 MG IV SOLR: 1.7500 mg/kg/h | INTRAVENOUS | Status: DC

## 2014-11-06 MED ORDER — ASPIRIN 81 MG PO CHEW: 81.0000 mg | CHEWABLE_TABLET | ORAL | Status: DC

## 2014-11-06 MED ORDER — GLIPIZIDE ER 10 MG PO TB24
10.0000 mg | ORAL_TABLET | Freq: Every day | ORAL | Status: DC
  Administered 2014-11-07: 08:00:00 10 mg via ORAL
  Filled 2014-11-06 (×4): qty 1

## 2014-11-06 MED ORDER — HEPARIN SODIUM (PORCINE) 1000 UNIT/ML IJ SOLN
INTRAMUSCULAR | Status: DC | PRN
  Administered 2014-11-06: 5000 [IU] via INTRAVENOUS

## 2014-11-06 MED ORDER — GI COCKTAIL ~~LOC~~
30.0000 mL | Freq: Four times a day (QID) | ORAL | Status: DC | PRN
  Filled 2014-11-06: qty 30

## 2014-11-06 MED ORDER — METFORMIN HCL 500 MG PO TABS
1000.0000 mg | ORAL_TABLET | Freq: Two times a day (BID) | ORAL | Status: DC
  Filled 2014-11-06 (×3): qty 2

## 2014-11-06 MED ORDER — AMLODIPINE BESYLATE 10 MG PO TABS
10.0000 mg | ORAL_TABLET | Freq: Every day | ORAL | Status: DC
  Administered 2014-11-06 – 2014-11-07 (×2): 10 mg via ORAL
  Filled 2014-11-06 (×2): qty 1

## 2014-11-06 MED ORDER — FENTANYL CITRATE (PF) 100 MCG/2ML IJ SOLN
INTRAMUSCULAR | Status: DC | PRN
  Administered 2014-11-06: 50 ug via INTRAVENOUS

## 2014-11-06 MED ORDER — TICAGRELOR 90 MG PO TABS
90.0000 mg | ORAL_TABLET | Freq: Two times a day (BID) | ORAL | Status: DC
  Administered 2014-11-07: 07:00:00 90 mg via ORAL
  Filled 2014-11-06: qty 1

## 2014-11-06 MED ORDER — ENOXAPARIN SODIUM 40 MG/0.4ML ~~LOC~~ SOLN
40.0000 mg | SUBCUTANEOUS | Status: DC
  Filled 2014-11-06: qty 0.4

## 2014-11-06 MED ORDER — FENTANYL CITRATE (PF) 100 MCG/2ML IJ SOLN
INTRAMUSCULAR | Status: AC
  Filled 2014-11-06: qty 4

## 2014-11-06 MED ORDER — ASPIRIN 81 MG PO CHEW
81.0000 mg | CHEWABLE_TABLET | Freq: Every day | ORAL | Status: DC
  Administered 2014-11-07: 81 mg via ORAL
  Filled 2014-11-06: qty 1

## 2014-11-06 MED ORDER — LIDOCAINE HCL (PF) 1 % IJ SOLN
INTRAMUSCULAR | Status: AC
  Filled 2014-11-06: qty 30

## 2014-11-06 MED ORDER — MIDAZOLAM HCL 2 MG/2ML IJ SOLN
INTRAMUSCULAR | Status: DC | PRN
  Administered 2014-11-06: 1 mg via INTRAVENOUS

## 2014-11-06 MED ORDER — SODIUM CHLORIDE 0.9 % IJ SOLN: 3.0000 mL | INTRAMUSCULAR | Status: DC | PRN

## 2014-11-06 MED ORDER — HEPARIN SODIUM (PORCINE) 1000 UNIT/ML IJ SOLN
INTRAMUSCULAR | Status: AC
  Filled 2014-11-06: qty 1

## 2014-11-06 MED ORDER — VERAPAMIL HCL 2.5 MG/ML IV SOLN
INTRAVENOUS | Status: DC | PRN
  Administered 2014-11-06: 16:00:00 via INTRA_ARTERIAL

## 2014-11-06 MED ORDER — ALBUTEROL SULFATE (2.5 MG/3ML) 0.083% IN NEBU: 3.0000 mL | INHALATION_SOLUTION | Freq: Four times a day (QID) | RESPIRATORY_TRACT | Status: DC | PRN

## 2014-11-06 MED ORDER — NITROGLYCERIN 1 MG/10 ML FOR IR/CATH LAB
INTRA_ARTERIAL | Status: AC
  Filled 2014-11-06: qty 10

## 2014-11-06 MED ORDER — ONDANSETRON HCL 4 MG/2ML IJ SOLN: 4.0000 mg | Freq: Four times a day (QID) | INTRAMUSCULAR | Status: DC | PRN

## 2014-11-06 MED ORDER — INSULIN ASPART 100 UNIT/ML ~~LOC~~ SOLN: 0.0000 [IU] | Freq: Three times a day (TID) | SUBCUTANEOUS | Status: DC

## 2014-11-06 MED ORDER — ATORVASTATIN CALCIUM 40 MG PO TABS
40.0000 mg | ORAL_TABLET | Freq: Every day | ORAL | Status: DC
  Administered 2014-11-06 – 2014-11-07 (×2): 40 mg via ORAL
  Filled 2014-11-06 (×2): qty 1

## 2014-11-06 MED ORDER — ASPIRIN EC 81 MG PO TBEC
81.0000 mg | DELAYED_RELEASE_TABLET | Freq: Every day | ORAL | Status: DC
  Administered 2014-11-06: 81 mg via ORAL
  Filled 2014-11-06: qty 1

## 2014-11-06 MED ORDER — BIVALIRUDIN 250 MG IV SOLR
0.2500 mg/kg/h | INTRAVENOUS | Status: DC
  Filled 2014-11-06: qty 250

## 2014-11-06 MED ORDER — PANTOPRAZOLE SODIUM 40 MG PO TBEC
40.0000 mg | DELAYED_RELEASE_TABLET | Freq: Every day | ORAL | Status: DC
  Administered 2014-11-06 – 2014-11-07 (×2): 40 mg via ORAL
  Filled 2014-11-06: qty 1

## 2014-11-06 MED ORDER — ACETAMINOPHEN 325 MG PO TABS: 650.0000 mg | ORAL_TABLET | ORAL | Status: DC | PRN

## 2014-11-06 MED ORDER — HYDRALAZINE HCL 20 MG/ML IJ SOLN: 10.0000 mg | INTRAMUSCULAR | Status: DC | PRN

## 2014-11-06 MED ORDER — HEPARIN (PORCINE) IN NACL 100-0.45 UNIT/ML-% IJ SOLN
1400.0000 [IU]/h | INTRAMUSCULAR | Status: DC
  Administered 2014-11-06: 1400 [IU]/h via INTRAVENOUS
  Filled 2014-11-06 (×2): qty 250

## 2014-11-06 MED ORDER — INSULIN ASPART 100 UNIT/ML ~~LOC~~ SOLN: 0.0000 [IU] | Freq: Every day | SUBCUTANEOUS | Status: DC

## 2014-11-06 MED ORDER — SODIUM CHLORIDE 0.9 % IV SOLN: 250.0000 mL | INTRAVENOUS | Status: DC | PRN

## 2014-11-06 MED ORDER — MORPHINE SULFATE 2 MG/ML IJ SOLN: 2.0000 mg | INTRAMUSCULAR | Status: DC | PRN

## 2014-11-06 MED ORDER — BIVALIRUDIN 250 MG IV SOLR
INTRAVENOUS | Status: AC
  Filled 2014-11-06: qty 250

## 2014-11-06 MED ORDER — OXYCODONE-ACETAMINOPHEN 5-325 MG PO TABS
1.0000 | ORAL_TABLET | ORAL | Status: DC | PRN
  Administered 2014-11-06: 23:00:00 2 via ORAL
  Filled 2014-11-06: qty 2

## 2014-11-06 MED ORDER — TICAGRELOR 90 MG PO TABS
ORAL_TABLET | ORAL | Status: DC | PRN
  Administered 2014-11-06: 180 mg via ORAL

## 2014-11-06 MED ORDER — HEPARIN BOLUS VIA INFUSION
4000.0000 [IU] | Freq: Once | INTRAVENOUS | Status: AC
  Administered 2014-11-06: 4000 [IU] via INTRAVENOUS
  Filled 2014-11-06: qty 4000

## 2014-11-06 MED ORDER — METFORMIN HCL 500 MG PO TABS: 1000.0000 mg | ORAL_TABLET | Freq: Two times a day (BID) | ORAL | Status: DC

## 2014-11-06 MED ORDER — SODIUM CHLORIDE 0.9 % IJ SOLN
3.0000 mL | Freq: Two times a day (BID) | INTRAMUSCULAR | Status: DC
  Administered 2014-11-06: 11:00:00 via INTRAVENOUS

## 2014-11-06 MED ORDER — HEPARIN (PORCINE) IN NACL 2-0.9 UNIT/ML-% IJ SOLN
INTRAMUSCULAR | Status: AC
  Filled 2014-11-06: qty 1000

## 2014-11-06 MED ORDER — BIVALIRUDIN BOLUS VIA INFUSION - CUPID
INTRAVENOUS | Status: DC | PRN
  Administered 2014-11-06: 83.775 mg via INTRAVENOUS

## 2014-11-06 MED ORDER — SODIUM CHLORIDE 0.9 % WEIGHT BASED INFUSION: 3.0000 mL/kg/h | INTRAVENOUS | Status: AC

## 2014-11-06 MED ORDER — LATANOPROST 0.005 % OP SOLN
1.0000 [drp] | Freq: Two times a day (BID) | OPHTHALMIC | Status: DC
  Administered 2014-11-06: 1 [drp] via OPHTHALMIC
  Filled 2014-11-06: qty 2.5

## 2014-11-06 MED ORDER — TICAGRELOR 90 MG PO TABS
ORAL_TABLET | ORAL | Status: AC
  Filled 2014-11-06: qty 1

## 2014-11-06 MED ORDER — INSULIN GLARGINE 100 UNIT/ML ~~LOC~~ SOLN
20.0000 [IU] | Freq: Every day | SUBCUTANEOUS | Status: DC
  Administered 2014-11-06: 23:00:00 20 [IU] via SUBCUTANEOUS
  Filled 2014-11-06 (×2): qty 0.2

## 2014-11-06 MED ORDER — SODIUM CHLORIDE 0.9 % IJ SOLN
3.0000 mL | Freq: Two times a day (BID) | INTRAMUSCULAR | Status: DC
  Administered 2014-11-06 – 2014-11-07 (×2): 3 mL via INTRAVENOUS

## 2014-11-06 MED ORDER — MIDAZOLAM HCL 2 MG/2ML IJ SOLN
INTRAMUSCULAR | Status: AC
  Filled 2014-11-06: qty 4

## 2014-11-06 SURGICAL SUPPLY — 21 items
BALLN EMERGE MR 2.25X8 (BALLOONS) ×2
BALLN EUPHORA RX 2.5X15 (BALLOONS) ×2
BALLN ~~LOC~~ TREK RX 3.0X12 (BALLOONS) ×2
BALLOON EMERGE MR 2.25X8 (BALLOONS) ×1 IMPLANT
BALLOON EUPHORA RX 2.5X15 (BALLOONS) ×1 IMPLANT
BALLOON ~~LOC~~ TREK RX 3.0X12 (BALLOONS) ×1 IMPLANT
CATH INFINITI 5 FR JL3.5 (CATHETERS) ×2 IMPLANT
CATH INFINITI 5FR ANG PIGTAIL (CATHETERS) IMPLANT
CATH INFINITI JR4 5F (CATHETERS) ×2 IMPLANT
DEVICE RAD COMP TR BAND LRG (VASCULAR PRODUCTS) ×2 IMPLANT
GLIDESHEATH SLEND A-KIT 6F 22G (SHEATH) ×2 IMPLANT
GUIDE CATH RUNWAY 6FR CLS3.5 (CATHETERS) ×2 IMPLANT
KIT ENCORE 26 ADVANTAGE (KITS) ×2 IMPLANT
KIT HEART LEFT (KITS) ×2 IMPLANT
PACK CARDIAC CATHETERIZATION (CUSTOM PROCEDURE TRAY) ×2 IMPLANT
STENT PROMUS PREM MR 2.75X16 (Permanent Stent) ×2 IMPLANT
TRANSDUCER W/STOPCOCK (MISCELLANEOUS) ×2 IMPLANT
TUBING CIL FLEX 10 FLL-RA (TUBING) ×2 IMPLANT
WIRE ASAHI PROWATER 180CM (WIRE) ×4 IMPLANT
WIRE HI TORQ BMW 190CM (WIRE) ×2 IMPLANT
WIRE SAFE-T 1.5MM-J .035X260CM (WIRE) ×2 IMPLANT

## 2014-11-06 NOTE — Progress Notes (Signed)
     SUBJECTIVE: No chest pain this am.   BP 179/80 mmHg  Pulse 60  Temp(Src) 98.1 F (36.7 C) (Oral)  Resp 16  Wt 246 lb 3.2 oz (111.676 kg)  SpO2 100% No intake or output data in the 24 hours ending 11/06/14 0902  PHYSICAL EXAM General: Well developed, well nourished, in no acute distress. Alert and oriented x 3.  Psych:  Good affect, responds appropriately Neck: No JVD. No masses noted.  Lungs: Clear bilaterally with no wheezes or rhonci noted.  Heart: RRR with no murmurs noted. Abdomen: Bowel sounds are present. Soft, non-tender.  Extremities: No lower extremity edema.   LABS: Basic Metabolic Panel:  Recent Labs  11/05/14 1906  NA 141  K 4.1  CL 103  CO2 28  GLUCOSE 192*  BUN 14  CREATININE 1.48*  CALCIUM 9.4   CBC:  Recent Labs  11/05/14 1906  WBC 8.7  NEUTROABS 6.7  HGB 14.1  HCT 42.5  MCV 84.5  PLT 175   Cardiac Enzymes:  Recent Labs  11/05/14 1906 11/06/14 0226  TROPONINI 0.07* 0.62*    Current Meds: . amLODipine  10 mg Oral Daily  . aspirin EC  81 mg Oral Daily  . atorvastatin  40 mg Oral Daily  . enoxaparin (LOVENOX) injection  40 mg Subcutaneous Q24H  . glipiZIDE  10 mg Oral Q breakfast  . insulin aspart  0-15 Units Subcutaneous TID WC  . insulin aspart  0-5 Units Subcutaneous QHS  . insulin glargine  20 Units Subcutaneous Q2200  . latanoprost  1 drop Both Eyes BID  . metFORMIN  1,000 mg Oral BID WC  . metoprolol tartrate  25 mg Oral BID  . pantoprazole  40 mg Oral Daily    ASSESSMENT AND PLAN: 64 yo man with DM, HTN, HLD, obesity, OSA admitted with chest pain. Troponin now positive consistent with NSTEMI. He was recently seen by Dr. Gwenlyn Found for similar pain and evaluated with an exercise MPI stress test on 10/2014. He achieved 94% MPHR and 10 METS with SOB but no CP. No ischemic ECG change and imaging showed a fixed inferior defect suggestive of diaphragmatic attenuation vs inferior infarct but no inducible ischemia.   1. NSTEMI:  Pt admitted with chest pain c/w unstable angina. Troponin is elevated. New T wave inversions inferior leads. Cardiac cath is indicated but will check BMET now to make sure renal function stable. Will plan afternoon cath if renal function is stable. Gentle hydration now. Will arrange echo to assess LVEF. Will start IV heparin. Continue ASA, statin, beta blocker.       Cameo Schmiesing  8/3/20169:02 AM

## 2014-11-06 NOTE — Progress Notes (Signed)
CRITICAL VALUE ALERT  Critical value received: Troponin .062  Date of notification:  11/06/14  Time of notification:  02:26  Critical value read back:yes  Nurse who received alert:  D.Twain Stenseth  MD notified :  Tylene Fantasia  Time of first page:  06:30  MD notified   Time of second page:  Responding MD:   Time MD responded:

## 2014-11-06 NOTE — H&P (Signed)
[facility] History and Physical  [name] ASN:[id number] DOB: Jan 24, 1951 DOA: 11/05/2014  Referring physician: Dr Audie Pinto PCP: [name], MD   Chief Complaint: Chest pain  HPI: [name] is a 64 y.o. male with hx of HTN, DM2 on insulin, GERD, depression, OSA on CPAP who presented today to ED for an episode of chest pain earlier today. Today he was working outside and [employer] around. He was sweating a lot then developed "gas" and "belching" then chest pressure and SOB.  He had pressure in his head and neck then as well. This all persisted for about 30 min total.  By the time he came to ED the symptoms had improved.  Trop was 0.07 and cardiology has consulted. Creat 1.48 which is up for him from Feb 2016 when it was 1.08.   Patient had a negative heart cath in 2004.  More recently he had a similar CP episode and underwent stress test in July this year per Dr Gwenlyn Found his heart doctor and those results were negative for ischemia.   ROS  no abd pain, n/v/d no joint pain  no rash  no confusion  no change in meds  no nsaids'  no fevers, chills , sweats  Where does patient live home Can patient participate in ADLs? yes  Past Medical History  Past Medical History  Diagnosis Date  . Anemia   . Type II or unspecified type diabetes mellitus without mention of complication, not stated as uncontrolled   . GERD (gastroesophageal reflux disease)   . Hyperlipidemia   . HTN (hypertension)   . Osteoarthritis   . Depression   . Adenomatous colon polyp 2011  . OSA on CPAP   . Sleep apnea     CPAP  . Chest pain     pleuritic   Past Surgical History  Past Surgical History  Procedure Laterality Date  . Pilonidal cyst excision    . Shoulder surgery      Right  . Cardiac catheterization  2004    patent coronary arteries  . Colonoscopy    . Polypectomy     Family History  Family History  Problem Relation Age of Onset  . Coronary artery disease Mother   .  Stroke Mother   . Diabetes Maternal Uncle     x2  . Alcohol abuse Other   . Coronary artery disease Other     CABG  . Heart disease Mother   . Diabetes Mother   . Diabetes Sister   . Diabetes Paternal Uncle     x2  . Colon cancer Paternal Uncle   . Colon polyps Neg Hx   . Esophageal cancer Neg Hx     Social History  reports that he quit smoking about 31 years ago. His smoking use included Cigarettes. He quit after 10 years of use. He has never used smokeless tobacco. He reports that he drinks alcohol. He reports that he does not use illicit drugs. Allergies  Allergies  Allergen Reactions  . Statins     REACTION: joint pain Lipitor- headaches Has also tried Livalo, pravachol, zetia, crestor, welchol   Home medications Prior to Admission medications   Medication Sig Start Date End Date Taking? Authorizing Provider  amLODipine (NORVASC) 10 MG tablet TAKE 1 TABLET (10 MG TOTAL) BY MOUTH DAILY. 04/19/14  Yes [name], MD  aspirin 81 MG EC tablet Take 81 mg by mouth daily.    Yes Historical Provider, MD  atorvastatin (  LIPITOR) 40 MG tablet Take 1 tablet (40 mg total) by mouth daily. 09/06/14  Yes [name], MD  esomeprazole (NEXIUM) 40 MG capsule Take 1 capsule (40 mg total) by mouth daily at 12 noon. 10/14/14  Yes Milus Banister, MD  glipiZIDE (GLIPIZIDE XL) 10 MG 24 hr tablet Take 1 tablet (10 mg total) by mouth daily with breakfast. 08/27/14  Yes [name], MD  Insulin Glargine (LANTUS SOLOSTAR) 100 UNIT/ML Solostar Pen Inject 20 Units into the skin daily at 10 pm. 08/23/14  Yes [name], MD  latanoprost (XALATAN) 0.005 % ophthalmic solution Place 1 drop into both eyes 2 (two) times daily. 09/19/14  Yes Historical Provider, MD  losartan (COZAAR) 100 MG tablet Take 1 tablet (100 mg total) by mouth daily. 01/07/14  Yes Janith Lima, MD  metFORMIN (GLUCOPHAGE) 1000 MG tablet Take 1 tablet (1,000 mg total) by mouth 2 (two) times daily with a meal. 08/17/13  Yes  [name], MD  Blood Glucose Monitoring Suppl (RELION CONFIRM GLUCOSE MONITOR) W/DEVICE KIT 1 Device by Does not apply route once. 05/01/12   Renato Shin, MD  glucose blood (RELION ULTIMA TEST) test strip 1 each by Other route 2 (two) times daily. And lancets 2/day 05/01/12   Renato Shin, MD  ibuprofen (ADVIL,MOTRIN) 600 MG tablet Take 1 tablet (600 mg total) by mouth three times daily x1 week Patient not taking: Reported on 11/05/2014 09/30/14   [name], MD  Insulin Pen Needle (NOVOFINE PLUS) 32G X 4 MM MISC Use 1x a day 05/09/14   [name], MD  omeprazole (PRILOSEC) 20 MG capsule Take 1 capsule (20 mg total) by mouth 2 (two) times daily before a meal. Patient not taking: Reported on 11/05/2014 08/02/14   Milus Banister, MD  PROAIR HFA 108 (90 BASE) MCG/ACT inhaler Inhale 1 puff into the lungs every 6 (six) hours as needed for wheezing.  08/22/14   Historical Provider, MD   Liver Function Tests No results for input(s): AST, ALT, ALKPHOS, BILITOT, PROT, ALBUMIN in the last 168 hours. No results for input(s): LIPASE, AMYLASE in the last 168 hours. CBC  Recent Labs Lab 11/05/14 1906  WBC 8.7  NEUTROABS 6.7  HGB 14.1  HCT 42.5  MCV 84.5  PLT 465   Basic Metabolic Panel  Recent Labs Lab 11/05/14 1906  NA 141  K 4.1  CL 103  CO2 28  GLUCOSE 192*  BUN 14  CREATININE 1.48*  CALCIUM 9.4    EKG: Independently reviewed > nsr with sinus arhythmia , prolonged QT interval, NSST-T changes   Filed Vitals:   11/05/14 2330 11/05/14 2345 11/06/14 0000 11/06/14 0033  BP: 175/81 147/124 150/81 163/78  Pulse: 76 70 70 66  Temp:    97.9 F (36.6 C)  TempSrc:    Oral  Resp: 18 13 14 16   Weight:    111.676 kg (246 lb 3.2 oz)  SpO2: 98% 96% 96% 100%   Exam: Alert, no distress, obese AAM No rash, cyanosis or gangrene Sclera anicteric, throat clear NO jvd or bruits Chest is clear bilat no rales , rhonchi or wheezing RRR no MRG Abd obese, soft no bruits, no mass or  ascites GU normal male Ext no LE or UE edema, on wounds/ ulcers/ gangrene Pedal pulses intact Neuro is alert, nf, Ox 3  Na 141 BUN 14  Creat 1.48  Glu 192   eGFR 56  Trop 0.07   WBC 8k  Hb 14  plt 175 CXR negative  Assessment: 1. Chest pain - admit , serial troponin, cardiology consult. Multiple risk factors. Creat up from recent baseline. 2. AKI - creat up from baseline, on ARB and taking NSAID"s . Will hold both for now.  3. DM cont lantus, metformin, glipizide 4. HTN cont norvasc, hold losartan 5. HL on statin  Plan - IVF"s , hold losartan/ nsaid's, recheck creat.  Cardiology to decide on further testing.    DVT Prophylaxis lovenox Code Status: full  Family Communication: none here  Disposition Plan: home when stable    McSwain D [facility] Pager [phone]  If 7PM-7AM, please contact night-coverage [email or web address] Password [email or web address] 11/06/2014, 2:51 AM

## 2014-11-06 NOTE — Progress Notes (Signed)
CRITICAL VALUE ALERT  Critical value received:  Troponin 0.62  Date of notification:  11/06/14  Time of notification:  02:26  Critical value read back:yes  Nurse who received alert: D.Dagny Fiorentino  MD notified (1st page):Kirby  Time of first page:  06:30 MD notified (2nd page):  Time of second page:  Responding MD:  Tylene Fantasia  Time MD responded:  06:40

## 2014-11-06 NOTE — Progress Notes (Signed)
ANTICOAGULATION CONSULT NOTE - Initial Consult  Pharmacy Consult for Heparin Indication: chest pain/ACS/NSTEMI  Allergies  Allergen Reactions  . Statins     REACTION: joint pain Lipitor- headaches Has also tried Livalo, pravachol, zetia, crestor, welchol    Patient Measurements: Height: 5\' 10"  (177.8 cm) Weight: 246 lb 3.2 oz (111.676 kg) IBW/kg (Calculated) : 73 Heparin Dosing Weight: 98 kg  Vital Signs: Temp: 98.1 F (36.7 C) (08/03 0426) Temp Source: Oral (08/03 0426) BP: 179/80 mmHg (08/03 0426) Pulse Rate: 60 (08/03 0426)  Labs:  Recent Labs  11/05/14 1906 11/06/14 0226 11/06/14 0830  HGB 14.1  --   --   HCT 42.5  --   --   PLT 175  --   --   CREATININE 1.48*  --   --   TROPONINI 0.07* 0.62* 0.47*    Estimated Creatinine Clearance: 63.1 mL/min (by C-G formula based on Cr of 1.48).   Medical History: Past Medical History  Diagnosis Date  . Anemia   . Type II or unspecified type diabetes mellitus without mention of complication, not stated as uncontrolled   . GERD (gastroesophageal reflux disease)   . Hyperlipidemia   . HTN (hypertension)   . Osteoarthritis   . Depression   . Adenomatous colon polyp 2011  . OSA on CPAP   . Sleep apnea     CPAP  . Chest pain     pleuritic  . Refusal of blood transfusions as patient is Jehovah's Witness    Assessment:   64 yr old male to being IV Heparin for NSTEMI.   Plan cardiac cath later today if repeat bmet stable.  Hydrating.   Creatinine 1.48 on admit early this am, up from baseline. Holding ARB and prn NSAID.  Will also need to hold Metformin if creatinine >1.5, or at least for 48hrs post-cath if cath done today.  Metformin held this am since NPO.  Goal of Therapy:  Heparin level 0.3-0.7 units/ml Monitor platelets by anticoagulation protocol: Yes   Plan:   Heparin 4000 units IV x 1.  Heparin drip to begin at 1400 units/hr.  Heparin level ~ 6 hrs after drip begins, if no cath later today, or will  follow-up post-cath.  Follow renal function, holding Metformin if needed.  Daily heparin level and CBC while on heparin.  Arty Baumgartner, [place] Pager: [phone] 11/06/2014,10:04 AM

## 2014-11-06 NOTE — Progress Notes (Signed)
[facility] PROGRESS NOTE  [name] ORTH GBT:[id number] DOB: October 29, 1950 DOA: 11/05/2014 PCP: [name], MD  Assessment/Plan: 1. Chest pain, CAD 1. Cardiology following 2. Pt is now s/p cath with stent placement 3. Pt reports feeling better 4. Chest pain free 2. AKI 1. Resolved 2. Held nephrotoxic agents 3. DM2 1. Glucose stable and controlled 4. HTN 1. BP stable 2. Cont to monitor 5. HLD 1. On statin 2. Stable 6. DVT prophylaxis 1. Heparin subQ  Code Status: Full Family Communication: Pt in room, family at bedside (indicate person spoken with, relationship, and if by phone, the number) Disposition Plan: Pending   Consultants:  Cardiology  Procedures:  Heart cath 8/3  Antibiotics:    HPI/Subjective: Reports feeling better  Objective: Filed Vitals:   11/06/14 1707 11/06/14 1730 11/06/14 1745 11/06/14 1800  BP:   171/71   Pulse: 0 69 63 64  Temp:  98 F (36.7 C) 98 F (36.7 C)   TempSrc:  Oral Oral   Resp: 0 15 15 15   Height:      Weight:      SpO2: 0%  99% 99%    Intake/Output Summary (Last 24 hours) at 11/06/14 1912 Last data filed at 11/06/14 1708  Gross per 24 hour  Intake      0 ml  Output    480 ml  Net   -480 ml   Filed Weights   11/05/14 1852 11/06/14 0033  Weight: 113.853 kg (251 lb) 111.676 kg (246 lb 3.2 oz)    Exam:   General:  Awake, in nad  Cardiovascular: regular, s1, s2  Respiratory: normal resp effort, no wheezing  Abdomen: soft,nondistended  Musculoskeletal: perfused, no clubbing   Data Reviewed: Basic Metabolic Panel:  Recent Labs Lab 11/05/14 1906 11/06/14 1028  NA 141 138  K 4.1 3.8  CL 103 104  CO2 28 27  GLUCOSE 192* 126*  BUN 14 12  CREATININE 1.48* 0.90  CALCIUM 9.4 8.8*   Liver Function Tests: No results for input(s): AST, ALT, ALKPHOS, BILITOT, PROT, ALBUMIN in the last 168 hours. No results for input(s): LIPASE, AMYLASE in the last 168 hours. No results for input(s): AMMONIA  in the last 168 hours. CBC:  Recent Labs Lab 11/05/14 1906 11/06/14 1028  WBC 8.7 8.5  NEUTROABS 6.7  --   HGB 14.1 13.9  HCT 42.5 41.8  MCV 84.5 85.7  PLT 175 219   Cardiac Enzymes:  Recent Labs Lab 11/05/14 1906 11/06/14 0226 11/06/14 0830  TROPONINI 0.07* 0.62* 0.47*   BNP (last 3 results) No results for input(s): BNP in the last 8760 hours.  ProBNP (last 3 results) No results for input(s): PROBNP in the last 8760 hours.  CBG:  Recent Labs Lab 11/06/14 0614 11/06/14 1107 11/06/14 1842  GLUCAP 119* 112* 109*    No results found for this or any previous visit (from the past 240 hour(s)).   Studies: Dg Chest 2 View  11/05/2014   CLINICAL DATA:  Fatigue, epigastric pain in shortness of breath all day today.  EXAM: CHEST  2 VIEW  COMPARISON:  12/13/2013  FINDINGS: The cardiac silhouette, mediastinal and hilar contours are within normal limits and stable. The lungs are clear. No pleural effusion. The bony thorax is intact.  IMPRESSION: No acute cardiopulmonary findings.   Electronically Signed   By: Marijo Sanes M.D.   On: 11/05/2014 19:46    Scheduled Meds: . amLODipine  10 mg Oral Daily  . aspirin  81  mg Oral Daily  . atorvastatin  40 mg Oral Daily  . glipiZIDE  10 mg Oral Q breakfast  . insulin aspart  0-15 Units Subcutaneous TID WC  . insulin aspart  0-5 Units Subcutaneous QHS  . insulin glargine  20 Units Subcutaneous Q2200  . latanoprost  1 drop Both Eyes BID  . [START ON 11/09/2014] metFORMIN  1,000 mg Oral BID WC  . metoprolol tartrate  25 mg Oral BID  . pantoprazole  40 mg Oral Daily  . sodium chloride  3 mL Intravenous Q12H  . [START ON 11/07/2014] ticagrelor  90 mg Oral BID   Continuous Infusions: . sodium chloride    . bivalirudin (ANGIOMAX) infusion 5 mg/mL (Cath Lab,ACS,PCI indication)      Principal Problem:   Chest pain Active Problems:   Type II diabetes mellitus with manifestations   Hyperlipidemia with target LDL less than 100    Essential hypertension, benign   AKI (acute kidney injury)   Pain in the chest   NSTEMI (non-ST elevated myocardial infarction)   CAD in native artery   CH[facility], [facility] Pager [phone]. If 7PM-7AM, please contact night-coverage at [email or web address], password [facility] 11/06/2014, 7:12 PM  LOS: 1 day

## 2014-11-06 NOTE — Interval H&P Note (Signed)
Cath Lab Visit (complete for each Cath Lab visit)  Clinical Evaluation Leading to the Procedure:   ACS: Yes.    Non-ACS:    Anginal Classification: CCS III  Anti-ischemic medical therapy: Maximal Therapy (2 or more classes of medications)  Non-Invasive Test Results: No non-invasive testing performed  Prior CABG: No previous CABG      History and Physical Interval Note:  11/06/2014 10:14 AM  [name]  has presented today for surgery, with the diagnosis of cp  The various methods of treatment have been discussed with the patient and family. After consideration of risks, benefits and other options for treatment, the patient has consented to  Procedure(s): Left Heart Cath and Coronary Angiography (N/A) as a surgical intervention .  The patient's history has been reviewed, patient examined, no change in status, stable for surgery.  I have reviewed the patient's chart and labs.  Questions were answered to the patient's satisfaction.     [name]

## 2014-11-06 NOTE — H&P (View-Only) (Signed)
     SUBJECTIVE: No chest pain this am.   BP 179/80 mmHg  Pulse 60  Temp(Src) 98.1 F (36.7 C) (Oral)  Resp 16  Wt 246 lb 3.2 oz (111.676 kg)  SpO2 100% No intake or output data in the 24 hours ending 11/06/14 0902  PHYSICAL EXAM General: Well developed, well nourished, in no acute distress. Alert and oriented x 3.  Psych:  Good affect, responds appropriately Neck: No JVD. No masses noted.  Lungs: Clear bilaterally with no wheezes or rhonci noted.  Heart: RRR with no murmurs noted. Abdomen: Bowel sounds are present. Soft, non-tender.  Extremities: No lower extremity edema.   LABS: Basic Metabolic Panel:  Recent Labs  11/05/14 1906  NA 141  K 4.1  CL 103  CO2 28  GLUCOSE 192*  BUN 14  CREATININE 1.48*  CALCIUM 9.4   CBC:  Recent Labs  11/05/14 1906  WBC 8.7  NEUTROABS 6.7  HGB 14.1  HCT 42.5  MCV 84.5  PLT 175   Cardiac Enzymes:  Recent Labs  11/05/14 1906 11/06/14 0226  TROPONINI 0.07* 0.62*    Current Meds: . amLODipine  10 mg Oral Daily  . aspirin EC  81 mg Oral Daily  . atorvastatin  40 mg Oral Daily  . enoxaparin (LOVENOX) injection  40 mg Subcutaneous Q24H  . glipiZIDE  10 mg Oral Q breakfast  . insulin aspart  0-15 Units Subcutaneous TID WC  . insulin aspart  0-5 Units Subcutaneous QHS  . insulin glargine  20 Units Subcutaneous Q2200  . latanoprost  1 drop Both Eyes BID  . metFORMIN  1,000 mg Oral BID WC  . metoprolol tartrate  25 mg Oral BID  . pantoprazole  40 mg Oral Daily    ASSESSMENT AND PLAN: 64 yo man with DM, HTN, HLD, obesity, OSA admitted with chest pain. Troponin now positive consistent with NSTEMI. He was recently seen by Dr. Gwenlyn Found for similar pain and evaluated with an exercise MPI stress test on 10/2014. He achieved 94% MPHR and 10 METS with SOB but no CP. No ischemic ECG change and imaging showed a fixed inferior defect suggestive of diaphragmatic attenuation vs inferior infarct but no inducible ischemia.   1. NSTEMI:  Pt admitted with chest pain c/w unstable angina. Troponin is elevated. New T wave inversions inferior leads. Cardiac cath is indicated but will check BMET now to make sure renal function stable. Will plan afternoon cath if renal function is stable. Gentle hydration now. Will arrange echo to assess LVEF. Will start IV heparin. Continue ASA, statin, beta blocker.       Avani Sensabaugh  8/3/20169:02 AM

## 2014-11-07 ENCOUNTER — Encounter (HOSPITAL_COMMUNITY): Payer: Self-pay | Admitting: Interventional Cardiology

## 2014-11-07 ENCOUNTER — Other Ambulatory Visit: Payer: Self-pay | Admitting: *Deleted

## 2014-11-07 ENCOUNTER — Telehealth: Payer: Self-pay | Admitting: Interventional Cardiology

## 2014-11-07 DIAGNOSIS — E785 Hyperlipidemia, unspecified: Secondary | ICD-10-CM

## 2014-11-07 LAB — CBC
HCT: 40.1 % (ref 39.0–52.0)
HEMOGLOBIN: 13.3 g/dL (ref 13.0–17.0)
MCH: 28.1 pg (ref 26.0–34.0)
MCHC: 33.2 g/dL (ref 30.0–36.0)
MCV: 84.6 fL (ref 78.0–100.0)
Platelets: 220 10*3/uL (ref 150–400)
RBC: 4.74 MIL/uL (ref 4.22–5.81)
RDW: 14.4 % (ref 11.5–15.5)
WBC: 7.1 10*3/uL (ref 4.0–10.5)

## 2014-11-07 LAB — BASIC METABOLIC PANEL
ANION GAP: 6 (ref 5–15)
BUN: 9 mg/dL (ref 6–20)
CO2: 26 mmol/L (ref 22–32)
CREATININE: 0.93 mg/dL (ref 0.61–1.24)
Calcium: 8.3 mg/dL — ABNORMAL LOW (ref 8.9–10.3)
Chloride: 106 mmol/L (ref 101–111)
GFR calc Af Amer: >60 mL/min (ref >60)
Glucose, Bld: 111 mg/dL — ABNORMAL HIGH (ref 65–99)
Potassium: 3.9 mmol/L (ref 3.5–5.1)
SODIUM: 138 mmol/L (ref 135–145)

## 2014-11-07 LAB — GLUCOSE, CAPILLARY: Glucose-Capillary: 116 mg/dL — ABNORMAL HIGH (ref 65–99)

## 2014-11-07 MED ORDER — TRAZODONE HCL 50 MG PO TABS: 50.0000 mg | ORAL_TABLET | Freq: Every evening | ORAL | Status: DC | PRN

## 2014-11-07 MED ORDER — AMBULATORY NON FORMULARY MEDICATION: 81.0000 mg | Freq: Every day | Status: DC

## 2014-11-07 MED ORDER — TICAGRELOR 90 MG PO TABS: 90.0000 mg | ORAL_TABLET | Freq: Two times a day (BID) | ORAL | Status: DC

## 2014-11-07 MED ORDER — METOPROLOL TARTRATE 25 MG PO TABS: 25.0000 mg | ORAL_TABLET | Freq: Two times a day (BID) | ORAL | Status: DC

## 2014-11-07 MED ORDER — AMBULATORY NON FORMULARY MEDICATION: 90.0000 mg | Freq: Two times a day (BID) | Status: DC

## 2014-11-07 MED ORDER — TRAZODONE HCL 50 MG PO TABS
50.0000 mg | ORAL_TABLET | Freq: Every evening | ORAL | Status: DC | PRN
  Administered 2014-11-07: 50 mg via ORAL
  Filled 2014-11-07: qty 1

## 2014-11-07 MED FILL — Nitroglycerin IV Soln 100 MCG/ML in D5W: INTRA_ARTERIAL | Qty: 10 | Status: AC

## 2014-11-07 MED FILL — Heparin Sodium (Porcine) 2 Unit/ML in Sodium Chloride 0.9%: INTRAMUSCULAR | Qty: 1000 | Status: AC

## 2014-11-07 MED FILL — Lidocaine HCl Local Preservative Free (PF) Inj 1%: INTRAMUSCULAR | Qty: 30 | Status: AC

## 2014-11-07 NOTE — Progress Notes (Signed)
CARDIAC REHAB PHASE I   PRE:  Rate/Rhythm: 56 SR  BP:  Sitting: 131/71        SaO2: 96 RA  MODE:  Ambulation: 1000 ft   POST:  Rate/Rhythm: 87 SR c/ occasional PVC  BP:  Sitting: 186/77         SaO2: 97 RA  Pt ambulated 1000 ft on RA, independent, steady gait, tolerated well.  Pt denies DOE,  cp, dizziness, declined rest stop. Completed MI/stent education with pt wife at bedside.  Reviewed risk factors, anti-platelet therapy, stent card, activity restrictions, ntg, exercise, heart healthy diet, carb counting, portion control, and phase 2 cardiac rehab. Pt verbalized understanding. Pt agrees to phase 2 cardiac rehab. Will send referral to [facility].  Pt to bed after walk, call bell within reach.  [phone]     Lenna Sciara, RN, BSN 11/07/2014 9:27 AM

## 2014-11-07 NOTE — Discharge Summary (Signed)
Physician Discharge Summary  [name] DJS:[id number] DOB: [birth year] DOA: 11/05/2014  PCP: [name], MD  Admit date: 11/05/2014 Discharge date: 11/07/2014  Time spent: 20 minutes  Recommendations for Outpatient Follow-up:  Follow up with PCP in 1-2 weeks Follow up with Dr. Tamala Julian in 7-10 days  Discharge Diagnoses:  Principal Problem:   Chest pain Active Problems:   Type II diabetes mellitus with manifestations   Hyperlipidemia with target LDL less than 100   Essential hypertension, benign   AKI (acute kidney injury)   Pain in the chest   NSTEMI (non-ST elevated myocardial infarction)   CAD in native artery   Discharge Condition: Stable  Diet recommendation: Heart Healthy, diabetic  Filed Weights   11/05/14 1852 11/06/14 0033 11/07/14 0026  Weight: 113.853 kg (251 lb) 111.676 kg (246 lb 3.2 oz) 112 kg (246 lb 14.6 oz)    History of present illness:  Please see admit h and p from 8/3 for details. Briefly, pt presented with chest pains with mildly elevated troponin. The patient was admitted for further work up.  Hospital Course:  1. Chest pain, CAD 1. Cardiology was consulted 2. Pt underwent cath with stent placement on 8/3 3. Pt reported feeling better and chest pain free 4. The patient is to continue on brilinta, metoprolol on discharge 2. AKI 1. Resolved 2. Held nephrotoxic agents 3. DM2 1. Glucose stable and controlled 4. HTN 1. BP stable 2. Cont to monitor 5. HLD 1. On statin 2. Stable 6. DVT prophylaxis 1. Heparin subQ  Procedures:  Cath 8/3  Consultations:  Cardiololgy  Discharge Exam: Filed Vitals:   11/06/14 1800 11/06/14 2000 11/07/14 0026 11/07/14 0648  BP:  179/96 176/85 137/64  Pulse: 64 66 65 55  Temp:   98.6 F (37 C) 97.8 F (36.6 C)  TempSrc:   Oral Oral  Resp: 15 17 19 16   Height:      Weight:   112 kg (246 lb 14.6 oz)   SpO2: 99% 99% 100% 98%    General: awake, in nad Cardiovascular: regular, s1,  s2 Respiratory: normal resp effort, no wheezing  Discharge Instructions      Discharge Instructions    Amb Referral to Cardiac Rehabilitation    Complete by:  As directed   Congestive Heart Failure: If diagnosis is Heart Failure, patient MUST meet each of the CMS criteria: 1. Left Ventricular Ejection Fraction </= 35% 2. NYHA class II-IV symptoms despite being on optimal heart failure therapy for at least 6 weeks. 3. Stable = have not had a recent (<6 weeks) or planned (<6 months) major cardiovascular hospitalization or procedure  Program Details: - Physician supervised classes - 1-3 classes per week over a 12-18 week period, generally for a total of 36 sessions  Physician Certification: I certify that the above Cardiac Rehabilitation treatment is medically necessary and is medically approved by me for treatment of this patient. The patient is willing and cooperative, able to ambulate and medically stable to participate in exercise rehabilitation. The participant's progress and Individualized Treatment Plan will be reviewed by the Medical Director, Cardiac Rehab staff and as indicated by the Referring/Ordering Physician.  Diagnosis:   Myocardial Infarction PCI              Medication List    STOP taking these medications        aspirin 81 MG EC tablet     ibuprofen 600 MG tablet  Commonly known as:  ADVIL,MOTRIN  losartan 100 MG tablet  Commonly known as:  COZAAR     omeprazole 20 MG capsule  Commonly known as:  PRILOSEC      TAKE these medications        amLODipine 10 MG tablet  Commonly known as:  NORVASC  TAKE 1 TABLET (10 MG TOTAL) BY MOUTH DAILY.     atorvastatin 40 MG tablet  Commonly known as:  LIPITOR  Take 1 tablet (40 mg total) by mouth daily.     esomeprazole 40 MG capsule  Commonly known as:  NEXIUM  Take 1 capsule (40 mg total) by mouth daily at 12 noon.     glipiZIDE 10 MG 24 hr tablet  Commonly known as:  GLIPIZIDE XL  Take 1 tablet (10  mg total) by mouth daily with breakfast.     glucose blood test strip  Commonly known as:  RELION ULTIMA TEST  1 each by Other route 2 (two) times daily. And lancets 2/day     Insulin Glargine 100 UNIT/ML Solostar Pen  Commonly known as:  LANTUS SOLOSTAR  Inject 20 Units into the skin daily at 10 pm.     Insulin Pen Needle 32G X 4 MM Misc  Commonly known as:  NOVOFINE PLUS  Use 1x a day     latanoprost 0.005 % ophthalmic solution  Commonly known as:  XALATAN  Place 1 drop into both eyes 2 (two) times daily.     metFORMIN 1000 MG tablet  Commonly known as:  GLUCOPHAGE  Take 1 tablet (1,000 mg total) by mouth 2 (two) times daily with a meal.     metoprolol tartrate 25 MG tablet  Commonly known as:  LOPRESSOR  Take 1 tablet (25 mg total) by mouth 2 (two) times daily.     PROAIR HFA 108 (90 BASE) MCG/ACT inhaler  Generic drug:  albuterol  Inhale 1 puff into the lungs every 6 (six) hours as needed for wheezing.     RELION CONFIRM GLUCOSE MONITOR W/DEVICE Kit  1 Device by Does not apply route once.     traZODone 50 MG tablet  Commonly known as:  DESYREL  Take 1 tablet (50 mg total) by mouth at bedtime as needed for sleep.       Allergies  Allergen Reactions  . Statins     REACTION: joint pain Lipitor- headaches Has also tried Livalo, pravachol, zetia, crestor, welchol   Follow-up Information    Follow up with [name], MD. Schedule an appointment as soon as possible for a visit in 1 week.   Specialty:  Cardiology   Why:  Hospital follow up   Contact information:   [address] [place] [address] [phone]       Follow up with [name], MD. Schedule an appointment as soon as possible for a visit in 2 weeks.   Specialty:  Internal Medicine   Why:  Hospital follow up   Contact information:   [address] [place] [address] [phone]        The results of significant diagnostics from this hospitalization  (including imaging, microbiology, ancillary and laboratory) are listed below for reference.    Significant Diagnostic Studies: Dg Chest 2 View  11/05/2014   CLINICAL DATA:  Fatigue, epigastric pain in shortness of breath all day today.  EXAM: CHEST  2 VIEW  COMPARISON:  12/13/2013  FINDINGS: The cardiac silhouette, mediastinal and hilar contours are within normal limits and stable. The  lungs are clear. No pleural effusion. The bony thorax is intact.  IMPRESSION: No acute cardiopulmonary findings.   Electronically Signed   By: Marijo Sanes M.D.   On: 11/05/2014 19:46    Microbiology: No results found for this or any previous visit (from the past 240 hour(s)).   Labs: Basic Metabolic Panel:  Recent Labs Lab 11/05/14 1906 11/06/14 1028 11/07/14 0300  NA 141 138 138  K 4.1 3.8 3.9  CL 103 104 106  CO2 28 27 26   GLUCOSE 192* 126* 111*  BUN 14 12 9   CREATININE 1.48* 0.90 0.93  CALCIUM 9.4 8.8* 8.3*   Liver Function Tests: No results for input(s): AST, ALT, ALKPHOS, BILITOT, PROT, ALBUMIN in the last 168 hours. No results for input(s): LIPASE, AMYLASE in the last 168 hours. No results for input(s): AMMONIA in the last 168 hours. CBC:  Recent Labs Lab 11/05/14 1906 11/06/14 1028 11/07/14 0300  WBC 8.7 8.5 7.1  NEUTROABS 6.7  --   --   HGB 14.1 13.9 13.3  HCT 42.5 41.8 40.1  MCV 84.5 85.7 84.6  PLT 175 219 220   Cardiac Enzymes:  Recent Labs Lab 11/05/14 1906 11/06/14 0226 11/06/14 0830 11/06/14 1820  TROPONINI 0.07* 0.62* 0.47* 0.25*   BNP: BNP (last 3 results) No results for input(s): BNP in the last 8760 hours.  ProBNP (last 3 results) No results for input(s): PROBNP in the last 8760 hours.  CBG:  Recent Labs Lab 11/06/14 0614 11/06/14 1107 11/06/14 1842 11/06/14 2214 11/07/14 0650  GLUCAP 119* 112* 109* 140* 116*       Signed:  Yakub Lodes K  [facility] 11/07/2014, 7:21 PM

## 2014-11-07 NOTE — Research (Signed)
TWILIGHT Informed Consent   Subject Name: [name]  Subject met inclusion and exclusion criteria.  The informed consent form, study requirements and expectations were reviewed with the subject and questions and concerns were addressed prior to the signing of the consent form.  The subject verbalized understanding of the trail requirements.  The subject agreed to participate in the TWILIGHT trial and signed the informed consent.  The informed consent was obtained prior to performance of any protocol-specific procedures for the subject.  A copy of the signed informed consent was given to the subject and a copy was placed in the subject's medical record.  Cele Mote 11/07/2014, 07:45 AM

## 2014-11-07 NOTE — Research (Signed)
TWILIGHT Study Informed Consent   Subject Name: BRANNAN CASSEDY  Subject met inclusion and exclusion criteria.  The informed consent form, study requirements and expectations were reviewed with the subject and questions and concerns were addressed prior to the signing of the consent form.  The subject verbalized understanding of the trail requirements.  The subject agreed to participate in the TWILIGHT trial and signed the informed consent.  The informed consent was obtained prior to performance of any protocol-specific procedures for the subject.  A copy of the signed informed consent was given to the subject and a copy was placed in the subject's medical record.  Hedrick,Tammy W 11/07/2014, 6580

## 2014-11-07 NOTE — Progress Notes (Signed)
       Patient Name: [name] Date of Encounter: 11/07/2014    SUBJECTIVE: He is doing well this AM. He denies angina and there no symptoms of angina. Discussed in detail the patients anatomy and RFM.  TELEMETRY:  NSR with occasional PVC's. : Filed Vitals:   11/06/14 1800 11/06/14 2000 11/07/14 0026 11/07/14 0648  BP:  179/96 176/85 137/64  Pulse: 64 66 65 55  Temp:   98.6 F (37 C) 97.8 F (36.6 C)  TempSrc:   Oral Oral  Resp: 15 17 19 16   Height:      Weight:   112 kg (246 lb 14.6 oz)   SpO2: 99% 99% 100% 98%    Intake/Output Summary (Last 24 hours) at 11/07/14 0842 Last data filed at 11/06/14 2216  Gross per 24 hour  Intake 2006.65 ml  Output    480 ml  Net 1526.65 ml   LABS: Basic Metabolic Panel:  Recent Labs  11/06/14 1028 11/07/14 0300  NA 138 138  K 3.8 3.9  CL 104 106  CO2 27 26  GLUCOSE 126* 111*  BUN 12 9  CREATININE 0.90 0.93  CALCIUM 8.8* 8.3*   CBC:  Recent Labs  11/05/14 1906 11/06/14 1028 11/07/14 0300  WBC 8.7 8.5 7.1  NEUTROABS 6.7  --   --   HGB 14.1 13.9 13.3  HCT 42.5 41.8 40.1  MCV 84.5 85.7 84.6  PLT 175 219 220   Cardiac Enzymes:  Recent Labs  11/06/14 0226 11/06/14 0830 11/06/14 1820  TROPONINI 0.62* 0.47* 0.25*    Radiology/Studies:    Physical Exam: Blood pressure 137/64, pulse 55, temperature 97.8 F (36.6 C), temperature source Oral, resp. rate 16, height 5\' 10"  (1.778 m), weight 112 kg (246 lb 14.6 oz), SpO2 98 %. Weight change: -1.853 kg (-4 lb 1.4 oz)  Wt Readings from Last 3 Encounters:  11/07/14 112 kg (246 lb 14.6 oz)  10/08/14 115.214 kg (254 lb)  09/30/14 115.259 kg (254 lb 1.6 oz)    Right radial cath site is unremarkable. Chest is clear Cardiac reveals an S4 Gallop.  ASSESSMENT:  1. CAD with multivessel disease and culprit circumflex disease treated with mid Cfx DES  and angioplasty of OM1 (unable to delivery stent due to angulation and tortuosity). 2. Risk factor  modification.   Plan:  1. Phase 2 cardiac rehab 2. F/U with Dr. Dellia Cloud rather than Gwenlyn Found (at family request). Will need to be seen in 7-10 days.  3. Aggressive risk factor modification. 4. Please discharge today.  Demetrios Isaacs 11/07/2014, 8:42 AM

## 2014-11-07 NOTE — Telephone Encounter (Signed)
Returned pt wife call. Pt was d/c form [facility] today. Pt discharge summary sts that pt should have been given an Rx for Metoprolol 25mg  bid, Brilinta 90mg  bid, and Trazodone..pt wife sts that there was not written Rx in pt paperwork. Metoprolol,Brilinta,Trazadone were in No Print status. Verified pt pharmacy. E-scribed Rx for Metoprolol and Brilinta she will pick them up today. Adv pt wife she would need to talk with pt pcp to provide an Rx for Trazdone. Adv pt wife that Dr.Smith scheduled is booked. We would work on getting pt an appt with an APP a day that Dr.Smith is in the office. She was agreeable with this plan. Adv her I will fwd a message to Dr.Smith's scheduler to call her with an appt. She verbalized understanding.

## 2014-11-07 NOTE — Telephone Encounter (Signed)
New Message       Pt's wife calling stating that some changes were made while the pt was in the hospital and Dr. Gwenlyn Found is no longer the pt's cardiologist and now Dr. Tamala Julian is. Pt's wife states that Dr. Tamala Julian told pt to f/u w/ him next week in our office, I offered pt's wife an appt w/ a PA for next week and she said that Dr. Tamala Julian told them to follow up with him. I notified pt's wife that Dr. Tamala Julian doesn't have any available appt and pt's wife is demanding that she would like a phone call back from Dr. Thompson Caul nurse. She also states that she needs to talk about pt's medications that were prescribed while in the hospital. Please call back and advise.

## 2014-11-08 ENCOUNTER — Encounter (HOSPITAL_COMMUNITY): Payer: Self-pay | Admitting: Interventional Cardiology

## 2014-11-11 ENCOUNTER — Encounter (HOSPITAL_COMMUNITY): Payer: Self-pay | Admitting: Interventional Cardiology

## 2014-11-11 MED ORDER — BIVALIRUDIN 250 MG IV SOLR
250.0000 mg | INTRAVENOUS | Status: DC | PRN
  Administered 2014-11-06: 1.75 mg/kg/h via INTRAVENOUS

## 2014-11-13 ENCOUNTER — Encounter: Payer: Self-pay | Admitting: Physician Assistant

## 2014-11-13 ENCOUNTER — Ambulatory Visit (INDEPENDENT_AMBULATORY_CARE_PROVIDER_SITE_OTHER): Payer: Federal, State, Local not specified - PPO | Admitting: Physician Assistant

## 2014-11-13 ENCOUNTER — Ambulatory Visit: Payer: Federal, State, Local not specified - PPO | Admitting: Physician Assistant

## 2014-11-13 VITALS — BP 152/86 | HR 78 | Ht 71.0 in | Wt 251.2 lb

## 2014-11-13 DIAGNOSIS — I2510 Atherosclerotic heart disease of native coronary artery without angina pectoris: Secondary | ICD-10-CM

## 2014-11-13 DIAGNOSIS — I10 Essential (primary) hypertension: Secondary | ICD-10-CM | POA: Diagnosis not present

## 2014-11-13 LAB — BASIC METABOLIC PANEL
BUN: 14 mg/dL (ref 6–23)
CO2: 27 mEq/L (ref 19–32)
CREATININE: 1.05 mg/dL (ref 0.40–1.50)
Calcium: 9.2 mg/dL (ref 8.4–10.5)
Chloride: 107 mEq/L (ref 96–112)
GFR: 91.24 mL/min (ref >60.00)
Glucose, Bld: 95 mg/dL (ref 70–99)
Potassium: 3.9 mEq/L (ref 3.5–5.1)
Sodium: 143 mEq/L (ref 135–145)

## 2014-11-13 MED ORDER — NITROGLYCERIN 0.4 MG SL SUBL: 0.4000 mg | SUBLINGUAL_TABLET | SUBLINGUAL | Status: DC | PRN

## 2014-11-13 MED ORDER — LOSARTAN POTASSIUM 100 MG PO TABS: 100.0000 mg | ORAL_TABLET | Freq: Every day | ORAL | Status: DC

## 2014-11-13 MED ORDER — METOPROLOL TARTRATE 25 MG PO TABS: 37.5000 mg | ORAL_TABLET | Freq: Two times a day (BID) | ORAL | Status: DC

## 2014-11-13 NOTE — Progress Notes (Signed)
Patient ID: [name], male   DOB: [birth year], 63 y.o.   MRN: [id number]    Date:  11/13/2014   ID:  [name], [name] 1951-01-31, MRN [id number]  PCP:  [name], MD  Primary Cardiologist:  Tamala Julian  Chief complaint: Post hospital follow-up   History of Present Illness:   63 yo man with DM, HTN, HLD, obesity, OSA and chest pain who presents to ED with chest pain on 11/06/2014. He was recently seen by Dr. Gwenlyn Found for similar pain and evaluated with an exercise MPI stress test on 10/2014. He achieved 94% MPHR and 10 METS with SOB but no CP. No ischemic ECG change and imaging showed a fixed inferior defect suggestive of diaphragmatic attenuation vs inferior infarct but no inducible ischemia. she underwent cardiac catheterization revealing multivessel disease and culprit circumflex disease treated with mid Cfx DES and angioplasty of OM1 (unable to delivery stent due to angulation and tortuosity).  He was enrolled in the [place] study.    He presented today for posthospital follow-up.  He was supposed to discontinue his losartan because of kidney injury however, he continued to take 100 mg daily.  He is doing well and currently denies nausea, vomiting, fever, chest pain, shortness of breath, orthopnea, dizziness, PND, cough, congestion, abdominal pain, hematochezia, melena, lower extremity edema, claudication.  Wt Readings from Last 3 Encounters:  11/13/14 251 lb 3.2 oz (113.944 kg)  11/07/14 246 lb 14.6 oz (112 kg)  10/08/14 254 lb (115.214 kg)     Past Medical History  Diagnosis Date  . Anemia   . Type II or unspecified type diabetes mellitus without mention of complication, not stated as uncontrolled   . GERD (gastroesophageal reflux disease)   . Hyperlipidemia   . HTN (hypertension)   . Osteoarthritis   . Depression   . Adenomatous colon polyp 2011  . OSA on CPAP   . Sleep apnea     CPAP  . Chest pain     pleuritic  . Refusal of blood transfusions as patient is  [employer]     Current Outpatient Prescriptions  Medication Sig Dispense Refill  . AMBULATORY NON FORMULARY MEDICATION Take 90 mg by mouth 2 (two) times daily. Medication Name: Brilinta 90 mg BID (Provided by the TWILIGHT Research Study do NOT fill)    . AMBULATORY NON FORMULARY MEDICATION Take 81 mg by mouth daily. Medication Name: ASA 81 mg Daily (Provided by the [facility])    . amLODipine (NORVASC) 10 MG tablet TAKE 1 TABLET (10 MG TOTAL) BY MOUTH DAILY. 180 tablet 1  . atorvastatin (LIPITOR) 40 MG tablet Take 1 tablet (40 mg total) by mouth daily. 90 tablet 3  . Blood Glucose Monitoring Suppl (RELION CONFIRM GLUCOSE MONITOR) W/DEVICE KIT 1 Device by Does not apply route once. 1 kit 0  . esomeprazole (NEXIUM) 40 MG capsule Take 1 capsule (40 mg total) by mouth daily at 12 noon. 30 capsule 3  . glipiZIDE (GLIPIZIDE XL) 10 MG 24 hr tablet Take 1 tablet (10 mg total) by mouth daily with breakfast. 90 tablet 1  . glucose blood (RELION ULTIMA TEST) test strip 1 each by Other route 2 (two) times daily. And lancets 2/day 100 each 12  . Insulin Glargine (LANTUS SOLOSTAR) 100 UNIT/ML Solostar Pen Inject 20 Units into the skin daily at 10 pm. 10 pen 1  . Insulin Pen Needle (NOVOFINE PLUS) 32G X 4 MM MISC Use 1x a day 100 each 11  .  latanoprost (XALATAN) 0.005 % ophthalmic solution Place 1 drop into both eyes 2 (two) times daily.  5  . metFORMIN (GLUCOPHAGE) 1000 MG tablet Take 1 tablet (1,000 mg total) by mouth 2 (two) times daily with a meal. 180 tablet 2  . metoprolol tartrate (LOPRESSOR) 25 MG tablet Take 1 tablet (25 mg total) by mouth 2 (two) times daily. 60 tablet 5  . PROAIR HFA 108 (90 BASE) MCG/ACT inhaler Inhale 1 puff into the lungs every 6 (six) hours as needed for wheezing.   1  . traZODone (DESYREL) 50 MG tablet Take 1 tablet (50 mg total) by mouth at bedtime as needed for sleep. 30 tablet 0   No current facility-administered medications for this visit.     Allergies:    Allergies  Allergen Reactions  . Statins Other (See Comments)    REACTION: joint pain Lipitor- headaches Has also tried Livalo, pravachol, zetia, crestor, welchol    Social History:  The patient  reports that he quit smoking about 31 years ago. His smoking use included Cigarettes. He quit after 10 years of use. He has never used smokeless tobacco. He reports that he drinks alcohol. He reports that he does not use illicit drugs.   Family history:   Family History  Problem Relation Age of Onset  . Coronary artery disease Mother   . Stroke Mother   . Diabetes Maternal Uncle     x2  . Alcohol abuse Other   . Coronary artery disease Other     CABG  . Heart disease Mother   . Diabetes Mother   . Diabetes Sister   . Diabetes Paternal Uncle     x2  . Colon cancer Paternal Uncle   . Colon polyps Neg Hx   . Esophageal cancer Neg Hx     ROS:  Please see the history of present illness.  All other systems reviewed and negative.   PHYSICAL EXAM: VS:  BP 152/86 mmHg  Pulse 78  Ht _0  (1.803 m)  Wt 251 lb 3.2 oz (113.944 kg)  BMI 35.05 kg/m2 obese, well developed, in no acute distress HEENT: Pupils are equal round react to light accommodation extraocular movements are intact.  Neck: no JVDNo cervical lymphadenopathy. Cardiac: Regular rate and rhythm without murmurs rubs or gallops. Lungs:  clear to auscultation bilaterally, no wheezing, rhonchi or rales Abd: soft, nontender, positive bowel sounds all quadrants, no hepatosplenomegaly Ext: 1+ lower extremity edema.  2+ radial and dorsalis pedis pulses. Skin: warm and dry Neuro:  Grossly normal  EKG:  sinus rhythm rate 70 bpm first-degree AV block  ASSESSMENT AND PLAN:  Coronary artery disease due to lipid rich plaque Status post left heart cath revealing multivessel disease and culprit circumflex disease treated with mid Cfx DES and angioplasty of OM1 (unable to delivery stent due to angulation and  tortuosity).  He'll be initiated on phase II cardiac rehabilitation. He Is on Aspirin and Brilinta through the [facility].    Essential hypertension He continues on amlodipine 10 mg daily and losartan 100 mg daily. And increase his Lopressor 37.5 mg twice a day.    Acute kidney injury Since he did not stop the losartan at discharge will check a basic metabolic panel.  Hyperlipidemia  Continue statin.  Obesity  We talked about diet and lifestyle modification, specifically, low carb diet.

## 2014-11-13 NOTE — Patient Instructions (Signed)
Medication Instructions:  Your physician has recommended you make the following change in your medication:  1)INCREASE Metoprolol to 37.5mg  (1 and 1/2 tablet) twice daily. An Rx has been sent to your pharmacy 2)START Nitro-glycerin use as directed as needed. An Rx has been sent to your pharmacy  Labwork: Bmet today  Testing/Procedures: None   Follow-Up: Your physician recommends that you schedule a follow-up appointment in: 2-3 months with Dr.Smith   Any Other Special Instructions Will Be Listed Below (If Applicable).    Nitroglycerin sublingual tablets What is this medicine? NITROGLYCERIN (nye troe GLI ser in) is a type of vasodilator. It relaxes blood vessels, increasing the blood and oxygen supply to your heart. This medicine is used to relieve chest pain caused by angina. It is also used to prevent chest pain before activities like climbing stairs, going outdoors in cold weather, or sexual activity. This medicine may be used for other purposes; ask your health care provider or pharmacist if you have questions. COMMON BRAND NAME(S): Nitroquick, Nitrostat, Nitrotab What should I tell my health care provider before I take this medicine? They need to know if you have any of these conditions: -anemia -head injury, recent stroke, or bleeding in the brain -liver disease -previous heart attack -an unusual or allergic reaction to nitroglycerin, other medicines, foods, dyes, or preservatives -pregnant or trying to get pregnant -breast-feeding How should I use this medicine? Take this medicine by mouth as needed. At the first sign of an angina attack (chest pain or tightness) place one tablet under your tongue. You can also take this medicine 5 to 10 minutes before an event likely to produce chest pain. Follow the directions on the prescription label. Let the tablet dissolve under the tongue. Do not swallow whole. Replace the dose if you accidentally swallow it. It will help if your mouth  is not dry. Saliva around the tablet will help it to dissolve more quickly. Do not eat or drink, smoke or chew tobacco while a tablet is dissolving. If you are not better within 5 minutes after taking ONE dose of nitroglycerin, call 9-1-1 immediately to seek emergency medical care. Do not take more than 3 nitroglycerin tablets over 15 minutes. If you take this medicine often to relieve symptoms of angina, your doctor or health care professional may provide you with different instructions to manage your symptoms. If symptoms do not go away after following these instructions, it is important to call 9-1-1 immediately. Do not take more than 3 nitroglycerin tablets over 15 minutes. Talk to your pediatrician regarding the use of this medicine in children. Special care may be needed. Overdosage: If you think you have taken too much of this medicine contact a poison control center or emergency room at once. NOTE: This medicine is only for you. Do not share this medicine with others. What if I miss a dose? This does not apply. This medicine is only used as needed. What may interact with this medicine? Do not take this medicine with any of the following medications: -certain migraine medicines like ergotamine and dihydroergotamine (DHE) -medicines used to treat erectile dysfunction like sildenafil, tadalafil, and vardenafil -riociguat This medicine may also interact with the following medications: -alteplase -aspirin -heparin -medicines for high blood pressure -medicines for mental depression -other medicines used to treat angina -phenothiazines like chlorpromazine, mesoridazine, prochlorperazine, thioridazine This list may not describe all possible interactions. Give your health care provider a list of all the medicines, herbs, non-prescription drugs, or dietary supplements you use.  Also tell them if you smoke, drink alcohol, or use illegal drugs. Some items may interact with your medicine. What should  I watch for while using this medicine? Tell your doctor or health care professional if you feel your medicine is no longer working. Keep this medicine with you at all times. Sit or lie down when you take your medicine to prevent falling if you feel dizzy or faint after using it. Try to remain calm. This will help you to feel better faster. If you feel dizzy, take several deep breaths and lie down with your feet propped up, or bend forward with your head resting between your knees. You may get drowsy or dizzy. Do not drive, use machinery, or do anything that needs mental alertness until you know how this drug affects you. Do not stand or sit up quickly, especially if you are an older patient. This reduces the risk of dizzy or fainting spells. Alcohol can make you more drowsy and dizzy. Avoid alcoholic drinks. Do not treat yourself for coughs, colds, or pain while you are taking this medicine without asking your doctor or health care professional for advice. Some ingredients may increase your blood pressure. What side effects may I notice from receiving this medicine? Side effects that you should report to your doctor or health care professional as soon as possible: -blurred vision -dry mouth -skin rash -sweating -the feeling of extreme pressure in the head -unusually weak or tired Side effects that usually do not require medical attention (report to your doctor or health care professional if they continue or are bothersome): -flushing of the face or neck -headache -irregular heartbeat, palpitations -nausea, vomiting This list may not describe all possible side effects. Call your doctor for medical advice about side effects. You may report side effects to FDA at 1-800-FDA-1088. Where should I keep my medicine? Keep out of the reach of children. Store at room temperature between 20 and 25 degrees C (68 and 77 degrees F). Store in [employer]. Protect from light and moisture. Keep tightly  closed. Throw away any unused medicine after the expiration date. NOTE: This sheet is a summary. It may not cover all possible information. If you have questions about this medicine, talk to your doctor, pharmacist, or health care provider.  2015, Elsevier/Gold Standard. (2013-01-18 17:57:36)

## 2014-11-21 ENCOUNTER — Ambulatory Visit: Payer: Medicare Other | Admitting: Internal Medicine

## 2014-11-25 ENCOUNTER — Telehealth: Payer: Self-pay | Admitting: Interventional Cardiology

## 2014-11-25 NOTE — Telephone Encounter (Signed)
New message      Pt has not heard from cardiac rehab.  Did we send the order in to them?  Please call and give her an update

## 2014-11-26 NOTE — Telephone Encounter (Signed)
Returned pt wife call. Pt wife aware of message below.  she verbalized understanding.

## 2014-11-26 NOTE — Telephone Encounter (Signed)
Returned pt wife call. lmom Pt cardiac ref form has been received. We are waiting on Dr.Smith's return to the office next week, for him to sign it. She is to call back if additional assistance is needed

## 2014-11-26 NOTE — Telephone Encounter (Signed)
Pt returning phone call, did not listen to VM, but wanted to speak w/ RN. Please call back and discuss.

## 2014-12-03 ENCOUNTER — Other Ambulatory Visit: Payer: Self-pay | Admitting: Internal Medicine

## 2014-12-05 ENCOUNTER — Telehealth (HOSPITAL_COMMUNITY): Payer: Self-pay | Admitting: *Deleted

## 2014-12-05 NOTE — Telephone Encounter (Signed)
Received signed MD order.  Pt called and message left on answering machine to please contact cardiac rehab.  Contact info provided. Cherre Huger, BSN

## 2014-12-06 ENCOUNTER — Other Ambulatory Visit: Payer: Self-pay | Admitting: Interventional Cardiology

## 2014-12-06 MED ORDER — TRAZODONE HCL 50 MG PO TABS: 50.0000 mg | ORAL_TABLET | Freq: Every evening | ORAL | Status: DC | PRN

## 2014-12-12 ENCOUNTER — Encounter: Payer: Self-pay | Admitting: *Deleted

## 2014-12-12 DIAGNOSIS — Z006 Encounter for examination for normal comparison and control in clinical research program: Secondary | ICD-10-CM

## 2014-12-12 NOTE — Progress Notes (Signed)
Left message for patient to call for [facility] follow-up

## 2014-12-13 ENCOUNTER — Telehealth: Payer: Self-pay | Admitting: Interventional Cardiology

## 2014-12-13 ENCOUNTER — Encounter: Payer: Self-pay | Admitting: *Deleted

## 2014-12-13 DIAGNOSIS — Z006 Encounter for examination for normal comparison and control in clinical research program: Secondary | ICD-10-CM

## 2014-12-13 NOTE — Telephone Encounter (Signed)
Returned pt wife call. She sts that has developed a productive cough over the last couple of days. Pt is not home and she sts that she is does not know if the phlegm is clear. She sts that pt does not have sob, she denies orthopnea, pt sleep on 1 pillow. She sts that pt does have some LE swelling. Pt hs not complained of fever, chills, chest pain, sob,nasuseu, vomiting. She is vague in describing pt symptoms. She would like pt to be sen by Dr.Smith only. Offered a 1 week work in appt with [place]. She feel pt needs to be seen sooner, and sts that maybe should take him to the ED. Adv her that Dr.Smith is out of the office. I will talk with another provider in the office and call back with their recommendation. She verbalized understanding.   Pt wife aware of Sandria Senter recommendation. It would be reasonable to schedule 1 wk work in appt with [place]. Pt should monitor/limit his sodium intake. Pt should call back or go to the ED if symptoms worsen. Pt wife verbalized understanding.

## 2014-12-13 NOTE — Progress Notes (Signed)
[facility] 1 month follow up phone call completed. Wife called research office after receiving my VM. Patient has been 100% complainant with study medication & has Not experienced any adverse events. Wife states "He said he feels like fluid building up in chest". Questioned wife for patients symptoms and wife only talks about how Dr. Tamala Julian was concerned with heart failure and adamant about patient being seen by Dr. Tamala Julian and not PA. Instructed patients wife that if patients symptoms are worsening that he does need to be seen before scheduled appointment in November. Wife wants to call and reschedule appointment sooner. Questions encouraged and answered.

## 2014-12-13 NOTE — Telephone Encounter (Signed)
New problem   Pt is having coughing and congestion and was told by Research to call office to speak to nurse. Please call.

## 2014-12-17 ENCOUNTER — Other Ambulatory Visit (INDEPENDENT_AMBULATORY_CARE_PROVIDER_SITE_OTHER): Payer: Federal, State, Local not specified - PPO | Admitting: *Deleted

## 2014-12-17 ENCOUNTER — Ambulatory Visit (INDEPENDENT_AMBULATORY_CARE_PROVIDER_SITE_OTHER): Payer: Federal, State, Local not specified - PPO | Admitting: Internal Medicine

## 2014-12-17 ENCOUNTER — Encounter: Payer: Self-pay | Admitting: Internal Medicine

## 2014-12-17 VITALS — BP 118/62 | HR 61 | Temp 97.7°F | Resp 12 | Wt 250.0 lb

## 2014-12-17 DIAGNOSIS — E1159 Type 2 diabetes mellitus with other circulatory complications: Secondary | ICD-10-CM

## 2014-12-17 LAB — HM DIABETES EYE EXAM

## 2014-12-17 LAB — POCT GLYCOSYLATED HEMOGLOBIN (HGB A1C): Hemoglobin A1C: 7.4

## 2014-12-17 NOTE — Progress Notes (Signed)
Patient ID: [name], male   DOB: January 12, 1951, 64 y.o.   MRN: [id number]  HPI: [name] is a 64 y.o.-year-old male, returning for f/u for DM2, dx 2007, insulin-dependent since 2014, uncontrolled, with complications (+ ED). Last visit 4 mo ago. PCP: Dr Odette Fraction.  Pt was admitted for CP in 11/2014: NTEMI >> CAD >> PTCA >> stent placed. He feels better. He is ready to start cardiac rehab.  Last hemoglobin A1c was: Lab Results  Component Value Date   HGBA1C 7.1* 08/21/2014   HGBA1C 9.4* 05/09/2014   HGBA1C 7.1* 11/05/2013   Pt is on a regimen of: - Metformin 1000 mg 2x a day - Glipizide XL 5 >> 10 mg daily in am. - Lantus (did not like Toujeo) 20 units in hs - started 07/2014 He was Levemir 140 units in am >> decreased to 70 units>> stopped as this was too expensive for him He stopped Welchol 3.7g at bedtime a day - at last  Stopped Farxiga 10 mg b/c frequent urination and urgency. He was on Glipizide in the past. He was on Metformin in the past >> no N/V.   Pt checks his sugars 2x a day and they are: - am: 79-117, 142 >> 49x1 (today), 89-130, 142 >> 90, 166-223, 257, 306 >> 90-163 >> 99-140 - 2h after b'fast: 150s >> 102, 146, 171, 191 >> n/c >> 128, 134 >> n/c - before lunch: n/c >> 112-168 (mostly on the low limit) >> n/c >> 120, 224-245 >> 206 >> n/c - 2h after lunch: n/c >> 210 (corrected the 79 in am) >> n/c >> 144, 205 >> n/c - before dinner: n/c >> 143 >> n/c >> 134-225, 263 >> 118-205 >> 140s - 2h after dinner: n/c >> 143, 175 >> n/c - bedtime: n/c >> 105-165, 185 >> 99-170 - nighttime: n/c >> 134, 210 >> 99-190 >> 94, 190-305 >> 148-216 No lows.Lowest sugar was 90; ? has hypoglycemia awareness  Highest sugar was 360 >> 190 >> 305 >> 200s.  - + mild CKD, last BUN/creatinine:  Lab Results  Component Value Date   BUN 14 11/13/2014   CREATININE 1.05 11/13/2014  On Losartan. - last set of lipids: Lab Results  Component Value Date   CHOL 201* 05/09/2014   HDL  45.80 05/09/2014   LDLCALC 141* 05/09/2014   LDLDIRECT 168.0 12/18/2012   TRIG 69.0 05/09/2014   CHOLHDL 4 05/09/2014  On Pravastatin. - last eye exam was in 012/2015. No DR. Has an exam at the end of the month. - no numbness and tingling in his feet.  I reviewed pt's medications, allergies, PMH, social hx, family hx, and changes were documented in the history of present illness. Otherwise, unchanged from my initial visit note.   ROS: Constitutional: no weight gain/loss, no fatigue, no subjective hyperthermia/hypothermia Eyes: no blurry vision, no xerophthalmia ENT: no sore throat, no nodules palpated in throat, no dysphagia/odynophagia, no hoarseness Cardiovascular: no CP/SOB/palpitations/leg swelling Respiratory: no cough/SOB Gastrointestinal: no N/V/D/C Musculoskeletal: no muscle/joint aches Skin: no rashes, + easy bruising Neurological: no tremors/numbness/tingling/dizziness  PE: BP 118/62 mmHg  Pulse 61  Temp(Src) 97.7 F (36.5 C) (Oral)  Resp 12  Wt 250 lb (113.399 kg)  SpO2 98% Body mass index is 34.88 kg/(m^2). Wt Readings from Last 3 Encounters:  12/17/14 250 lb (113.399 kg)  11/13/14 251 lb 3.2 oz (113.944 kg)  11/07/14 246 lb 14.6 oz (112 kg)   Constitutional: overweight, in NAD Eyes: PERRLA, EOMI, no  exophthalmos ENT: moist mucous membranes, no thyromegaly, no cervical lymphadenopathy Cardiovascular: irreg irreg R, No MRG, + RLE edematous, pitting (h/o knee surgery) Respiratory: CTA B Gastrointestinal: abdomen soft, NT, ND, BS+ Musculoskeletal: no deformities, strength intact in all 4 Skin: moist, warm, no rashes Neurological: no tremor with outstretched hands, DTR normal in all 4  ASSESSMENT: 1. DM2, insulin-dependent, uncontrolled, with complications - CAD, s/p NSTEMI, s/p stent 11/2014 - ED  PLAN:  1. Patient with long-standing, uncontrolled diabetes with improved sugars. Will continue current regimen ut advised him to check sugars more frequently  before and after cardiac rehab sessions to see if develops lows >> will need to decrease Glipizide if this happens. - I suggested to:  Patient Instructions  Please continue: - Lantus 20 units at bedtime - Glipizide XL 10 mg in am - Metformin 1000 mg 2x a day with meals.  Please return in 3 months with your sugar log.   - continue checking sugars at different times of the day - check 2 times a day, rotating checks - advised for yearly eye exams, he is up to date  - check Hba1c today >> 7.4% (higher) - Return to clinic in 3 mo with sugar log

## 2014-12-17 NOTE — Patient Instructions (Signed)
Please continue: - Lantus 20 units at bedtime - Glipizide XL 10 mg in am - Metformin 1000 mg 2x a day with meals.  Please return in 3 months with your sugar log.

## 2014-12-18 ENCOUNTER — Ambulatory Visit: Payer: Federal, State, Local not specified - PPO | Admitting: Cardiovascular Disease

## 2014-12-19 ENCOUNTER — Other Ambulatory Visit: Payer: Self-pay | Admitting: Internal Medicine

## 2014-12-19 ENCOUNTER — Encounter: Payer: Self-pay | Admitting: Internal Medicine

## 2014-12-20 ENCOUNTER — Encounter: Payer: Self-pay | Admitting: Interventional Cardiology

## 2014-12-20 ENCOUNTER — Ambulatory Visit (INDEPENDENT_AMBULATORY_CARE_PROVIDER_SITE_OTHER): Payer: Federal, State, Local not specified - PPO | Admitting: Interventional Cardiology

## 2014-12-20 VITALS — BP 132/88 | HR 60 | Ht 71.0 in | Wt 247.0 lb

## 2014-12-20 DIAGNOSIS — R0602 Shortness of breath: Secondary | ICD-10-CM

## 2014-12-20 DIAGNOSIS — I10 Essential (primary) hypertension: Secondary | ICD-10-CM | POA: Diagnosis not present

## 2014-12-20 DIAGNOSIS — R972 Elevated prostate specific antigen [PSA]: Secondary | ICD-10-CM

## 2014-12-20 DIAGNOSIS — E1159 Type 2 diabetes mellitus with other circulatory complications: Secondary | ICD-10-CM

## 2014-12-20 DIAGNOSIS — I2510 Atherosclerotic heart disease of native coronary artery without angina pectoris: Secondary | ICD-10-CM | POA: Diagnosis not present

## 2014-12-20 LAB — CBC WITH DIFFERENTIAL/PLATELET
BASOS ABS: 0 10*3/uL (ref 0.0–0.1)
BASOS PCT: 0.6 % (ref 0.0–3.0)
EOS ABS: 0.3 10*3/uL (ref 0.0–0.7)
Eosinophils Relative: 4.3 % (ref 0.0–5.0)
HCT: 41.2 % (ref 39.0–52.0)
Hemoglobin: 13.4 g/dL (ref 13.0–17.0)
LYMPHS ABS: 2.1 10*3/uL (ref 0.7–4.0)
LYMPHS PCT: 34.6 % (ref 12.0–46.0)
MCHC: 32.5 g/dL (ref 30.0–36.0)
MCV: 86 fl (ref 78.0–100.0)
MONO ABS: 0.7 10*3/uL (ref 0.1–1.0)
Monocytes Relative: 11.7 % (ref 3.0–12.0)
NEUTROS ABS: 2.9 10*3/uL (ref 1.4–7.7)
NEUTROS PCT: 48.8 % (ref 43.0–77.0)
PLATELETS: 239 10*3/uL (ref 150.0–400.0)
RBC: 4.79 Mil/uL (ref 4.22–5.81)
RDW: 14.9 % (ref 11.5–15.5)
WBC: 5.9 10*3/uL (ref 4.0–10.5)

## 2014-12-20 LAB — BRAIN NATRIURETIC PEPTIDE: Pro B Natriuretic peptide (BNP): 25 pg/mL (ref 0.0–100.0)

## 2014-12-20 LAB — BASIC METABOLIC PANEL
BUN: 10 mg/dL (ref 6–23)
CALCIUM: 8.8 mg/dL (ref 8.4–10.5)
CO2: 27 meq/L (ref 19–32)
CREATININE: 0.95 mg/dL (ref 0.40–1.50)
Chloride: 104 mEq/L (ref 96–112)
GFR: 102.38 mL/min (ref >60.00)
GLUCOSE: 121 mg/dL — AB (ref 70–99)
Potassium: 3.9 mEq/L (ref 3.5–5.1)
SODIUM: 138 meq/L (ref 135–145)

## 2014-12-20 NOTE — Patient Instructions (Signed)
Medication Instructions:  Your physician recommends that you continue on your current medications as directed. Please refer to the Current Medication list given to you today.   Labwork: Bmet,Bnp,Cbc  Testing/Procedures: None ordered  Follow-Up: Your physician recommends that you schedule a follow-up appointment in: 2-3 months with Dr.Smith   Any Other Special Instructions Will Be Listed Below (If Applicable).

## 2014-12-20 NOTE — Progress Notes (Signed)
Cardiology Office Note   Date:  12/20/2014   ID:  [name], DOB 31-May-1950, MRN [id number]  PCP:  [name], MD  Cardiologist:  [name], MD   Chief Complaint  Patient presents with  . Coronary Artery Disease      History of Present Illness: [name] is a 64 y.o. male who presents for CAD, mid circumflex drug-eluting stent and first obtuse marginal angioplasty August 2683, chronic diastolic heart failure, essential hypertension, hyperlipidemia, type 2 diabetes, and obstructive sleep apnea.  Exertional tolerance has significantly improved. No exertional chest discomfort. With he and his wife have had some concerns about a cough and a feeling of congestion. He denies orthopnea PND. No lower extremity swelling. He is back working without limitations. He has not yet started cardiac rehabilitation.  His wife is also concerned when he bends over he get short of breath.    Past Medical History  Diagnosis Date  . Anemia   . Type II or unspecified type diabetes mellitus without mention of complication, not stated as uncontrolled   . GERD (gastroesophageal reflux disease)   . Hyperlipidemia   . HTN (hypertension)   . Osteoarthritis   . Depression   . Adenomatous colon polyp 2011  . OSA on CPAP   . Sleep apnea     CPAP  . Chest pain     pleuritic  . Refusal of blood transfusions as patient is Jehovah's Witness     Past Surgical History  Procedure Laterality Date  . Pilonidal cyst excision    . Shoulder surgery      Right  . Cardiac catheterization  2004    patent coronary arteries  . Colonoscopy    . Polypectomy    . Cardiac catheterization N/A 11/06/2014    Procedure: Left Heart Cath and Coronary Angiography;  Surgeon: Belva Crome, MD;  Location: [facility];  Service: Cardiovascular;  Laterality: N/A;     Current Outpatient Prescriptions  Medication Sig Dispense Refill  . AMBULATORY NON FORMULARY MEDICATION Take 90 mg by mouth  2 (two) times daily. Medication Name: Brilinta 90 mg BID (Provided by the [facility] Study do NOT fill)    . AMBULATORY NON FORMULARY MEDICATION Take 81 mg by mouth daily. Medication Name: ASA 81 mg Daily (Provided by the [facility])    . amLODipine (NORVASC) 10 MG tablet TAKE 1 TABLET (10 MG TOTAL) BY MOUTH DAILY. 180 tablet 1  . atorvastatin (LIPITOR) 40 MG tablet Take 1 tablet (40 mg total) by mouth daily. 90 tablet 3  . Blood Glucose Monitoring Suppl (RELION CONFIRM GLUCOSE MONITOR) W/DEVICE KIT 1 Device by Does not apply route once. 1 kit 0  . esomeprazole (NEXIUM) 40 MG capsule Take 1 capsule (40 mg total) by mouth daily at 12 noon. 30 capsule 3  . glipiZIDE (GLIPIZIDE XL) 10 MG 24 hr tablet Take 1 tablet (10 mg total) by mouth daily with breakfast. 90 tablet 1  . glucose blood (RELION ULTIMA TEST) test strip 1 each by Other route 2 (two) times daily. And lancets 2/day 100 each 12  . Insulin Pen Needle (NOVOFINE PLUS) 32G X 4 MM MISC Use 1x a day 100 each 11  . LANTUS SOLOSTAR 100 UNIT/ML Solostar Pen INJECT 20 UNITS INTO THE SKIN DAILY AT 10 PM. 15 pen 2  . latanoprost (XALATAN) 0.005 % ophthalmic solution Place 1 drop into both eyes 2 (two) times daily.  5  .  losartan (COZAAR) 100 MG tablet Take 1 tablet (100 mg total) by mouth daily.    . metFORMIN (GLUCOPHAGE) 1000 MG tablet Take 1 tablet (1,000 mg total) by mouth 2 (two) times daily with a meal. 180 tablet 2  . metoprolol tartrate (LOPRESSOR) 25 MG tablet Take 1.5 tablets (37.5 mg total) by mouth 2 (two) times daily. 90 tablet 5  . nitroGLYCERIN (NITROSTAT) 0.4 MG SL tablet Place 1 tablet (0.4 mg total) under the tongue every 5 (five) minutes as needed. 25 tablet 3  . PROAIR HFA 108 (90 BASE) MCG/ACT inhaler Inhale 1 puff into the lungs every 6 (six) hours as needed for wheezing.   1  . traZODone (DESYREL) 50 MG tablet Take 1 tablet (50 mg total) by mouth at bedtime as needed for sleep. 30 tablet 3   No current  facility-administered medications for this visit.    Allergies:   Statins    Social History:  The patient  reports that he quit smoking about 31 years ago. His smoking use included Cigarettes. He quit after 10 years of use. He has never used smokeless tobacco. He reports that he drinks alcohol. He reports that he does not use illicit drugs.   Family History:  The patient's family history includes Alcohol abuse in his other; Colon cancer in his paternal uncle; Coronary artery disease in his mother and other; Diabetes in his maternal uncle, mother, paternal uncle, and sister; Heart disease in his mother; Stroke in his mother. There is no history of Colon polyps or Esophageal cancer.    ROS:  Please see the history of present illness.   Otherwise, review of systems are positive for blood in his urine and dysuria. PSA recently done is 4.3 which is mildly elevated..   All other systems are reviewed and negative.    PHYSICAL EXAM: VS:  BP 132/88 mmHg  Pulse 60  Ht 5' 11" (1.803 m)  Wt 112.038 kg (247 lb)  BMI 34.46 kg/m2  SpO2 97% , BMI Body mass index is 34.46 kg/(m^2). GEN: Well nourished, well developed, in no acute distress HEENT: normal Neck: no JVD, carotid bruits, or masses Cardiac: RRR.  There is no murmur, rub, or gallop. There is no edema. Respiratory:  clear to auscultation bilaterally, normal work of breathing. GI: soft, nontender, nondistended, + BS MS: no deformity or atrophy Skin: warm and dry, no rash Neuro:  Strength and sensation are intact Psych: euthymic mood, full affect   EKG:  EKG is not ordered today.    Recent Labs: 05/09/2014: ALT 17 11/07/2014: Hemoglobin 13.3; Platelets 220 11/13/2014: BUN 14; Creatinine, Ser 1.05; Potassium 3.9; Sodium 143    Lipid Panel    Component Value Date/Time   CHOL 201* 05/09/2014 0909   TRIG 69.0 05/09/2014 0909   HDL 45.80 05/09/2014 0909   CHOLHDL 4 05/09/2014 0909   VLDL 13.8 05/09/2014 0909   LDLCALC 141* 05/09/2014  0909   LDLDIRECT 168.0 12/18/2012 1340      Wt Readings from Last 3 Encounters:  12/20/14 112.038 kg (247 lb)  12/17/14 113.399 kg (250 lb)  11/13/14 113.944 kg (251 lb 3.2 oz)      Other studies Reviewed: Additional studies/ records that were reviewed today include: Reviewed laboratory data from his urologist in [facility].. The findings include laboratory data demonstrated an elevated PSA of 4.3, elevated alkaline phosphatase of 130, normal testosterone level, normal hemoglobin, normal kidney function. For some reason testosterone, LH, FSH, and prolactin levels. LH FSH and  prolactin levels were elevated..    ASSESSMENT AND PLAN:  1. CAD in native artery Improved anginal grade  2. Essential hypertension, benign Controlled - Basic metabolic panel  3. Type 2 diabetes mellitus with other circulatory complications Current status not known  4. SOB (shortness of breath) Uncertain cause. Could just be related to obesity and deconditioning. Rule out pulmonary congestion - Basic metabolic panel - B Nat Peptide - CBC w/Diff  5. Urinary complaints with elevated PSA. Needs close follow-up by urology. Would not be advisable to discontinue duo antiplatelet therapy until we are 6 months out from stenting  Current medicines are reviewed at length with the patient today.  The patient has the following concerns regarding medicines: None.  The following changes/actions have been instituted:    CBC, basic metabolic panel, and BNP  Enroll in phase II cardiac rehabilitation  Clinical follow-up in 3 months  Follow-up with urology concerning elevated PSA.  Labs/ tests ordered today include:  Orders Placed This Encounter  Procedures  . Basic metabolic panel  . B Nat Peptide  . CBC w/Diff     Disposition:   FU with HS in 4 months  Signed, [name], MD  12/20/2014 2:01 PM    [address], [place], [place]  [address] Phone: [phone]; Fax: [phone]

## 2014-12-23 ENCOUNTER — Telehealth: Payer: Self-pay

## 2014-12-23 NOTE — Telephone Encounter (Signed)
called to give pt lab results.lmtcb

## 2014-12-23 NOTE — Telephone Encounter (Signed)
-----   Message from Belva Crome, MD sent at 12/20/2014  6:27 PM EDT ----- Labs are normal including BNP. No evidence for fluid buildup in the lungs.

## 2014-12-26 ENCOUNTER — Encounter (HOSPITAL_COMMUNITY)
Admission: RE | Admit: 2014-12-26 | Discharge: 2014-12-26 | Disposition: A | Discharge status: Home / Self Care | Payer: Federal, State, Local not specified - PPO | Source: Ambulatory Visit | Attending: Interventional Cardiology | Admitting: Interventional Cardiology

## 2014-12-26 DIAGNOSIS — I252 Old myocardial infarction: Secondary | ICD-10-CM | POA: Insufficient documentation

## 2014-12-26 DIAGNOSIS — Z955 Presence of coronary angioplasty implant and graft: Secondary | ICD-10-CM | POA: Insufficient documentation

## 2014-12-26 DIAGNOSIS — Z48812 Encounter for surgical aftercare following surgery on the circulatory system: Secondary | ICD-10-CM | POA: Insufficient documentation

## 2014-12-26 NOTE — Progress Notes (Addendum)
Cardiac Rehab Medication Review by a Pharmacist  Does the patient  feel that his/her medications are working for him/her?  YES  Has the patient been experiencing any side effects to the medications prescribed?  Muscle pain from his atorvastatin  Does the patient measure his/her own blood pressure or blood glucose at home?  He has a cuff at home but it does not work very well. He wants to get a new one and start using it.   Does the patient have any problems obtaining medications due to transportation or finances?   NO  Understanding of regimen: good Understanding of indications: good Potential of compliance: good    Pharmacist comments: Patient does not use a pill box or any other method to remember to take his medications.  Pt states that he is receiving the brilinta and ASA free for a drug study.   [name], PharmD Pharmacy Resident  Pager: [phone] 12/26/2014 8:56 AM

## 2014-12-30 ENCOUNTER — Telehealth: Payer: Self-pay

## 2014-12-30 ENCOUNTER — Encounter (HOSPITAL_COMMUNITY)
Admission: RE | Admit: 2014-12-30 | Discharge: 2014-12-30 | Disposition: A | Discharge status: Home / Self Care | Payer: Federal, State, Local not specified - PPO | Source: Ambulatory Visit | Attending: Interventional Cardiology | Admitting: Interventional Cardiology

## 2014-12-30 DIAGNOSIS — Z955 Presence of coronary angioplasty implant and graft: Secondary | ICD-10-CM | POA: Diagnosis not present

## 2014-12-30 DIAGNOSIS — Z48812 Encounter for surgical aftercare following surgery on the circulatory system: Secondary | ICD-10-CM | POA: Diagnosis not present

## 2014-12-30 DIAGNOSIS — I252 Old myocardial infarction: Secondary | ICD-10-CM | POA: Diagnosis not present

## 2014-12-30 LAB — GLUCOSE, CAPILLARY
Glucose-Capillary: 66 mg/dL (ref 65–99)
Glucose-Capillary: 86 mg/dL (ref 65–99)

## 2014-12-30 NOTE — Progress Notes (Signed)
Mr Ocain came to exercise at cardiac rehab this afternoon.  Mr Hunsucker said he did not eat anything all day. The patient said he was outside working in the yard this morning. CBG 66 this afternoon. Patient asymptomatic. Patient given a ginger ale. Recheck CBG 86. No exercise today per protocol. Dr Chrissie Noa office called and notified. Upon review of Mr [facility] medication. Mr Dooly thinks he is only taking 25 mg of Metoprolol tartate twice a day. Patient plans to go home and check his bottle's and let me know. The ordered dose is 37.5 mg of metoprolol twice a day. Mr Magowan was given graham cracker and peanut butter and water. The patient was instructed to eat a meal. Mr Krogh is going out of town and will not return to cardiac rehab until the next week. Mr Yepiz left cardiac rehab without complaints.

## 2014-12-31 ENCOUNTER — Encounter: Payer: Self-pay | Admitting: Internal Medicine

## 2014-12-31 ENCOUNTER — Telehealth (HOSPITAL_COMMUNITY): Payer: Self-pay | Admitting: *Deleted

## 2014-12-31 ENCOUNTER — Encounter: Payer: Self-pay | Admitting: *Deleted

## 2014-12-31 ENCOUNTER — Other Ambulatory Visit: Payer: Self-pay | Admitting: *Deleted

## 2014-12-31 DIAGNOSIS — Z006 Encounter for examination for normal comparison and control in clinical research program: Secondary | ICD-10-CM

## 2014-12-31 MED ORDER — AMBULATORY NON FORMULARY MEDICATION: 90.0000 mg | Freq: Two times a day (BID) | Status: DC

## 2014-12-31 NOTE — Telephone Encounter (Signed)
Returned call to Verdis Frederickson, Therapist, sports at [facility] cardiac rehab. She sts that pt never increased his Metoprolol dosage to 37.5mg  bid as instructed by Audry Riles on 8/10. Pt did not want to make any changes unless Dr.Smith was aware. Pt has continued taking Metoprolol 25mg  bid. Pt unable to participate in Rehab yesterday because of low blood sugar. Pt bp was 142/80 61bpm with no exercise. Pt is scheduled to resume rehab next week, clarification on pt Metoprolol dosage. Samara Deist I will fwd a message to Dr.Smith and call the pt directly with his response

## 2014-12-31 NOTE — Progress Notes (Signed)
Received call from Cardiac Rehab that patient was concerned about running out of study medication and to check on patient to make sure he was taking medication as instructed because he was not taking his metoprolol correctly. Patient contacted and states he has enough Study medication. Confirmed with patient ASA and Brilinta dosing. Patient stated back the correct way to take. Reminded patient we would see him Oct 19 when he is here for Cardiac Rehab to re dispense more ASA and Brilinta. Verbalizes understanding.

## 2014-12-31 NOTE — Telephone Encounter (Signed)
Needs to increase metoprolol to a slightly higher dose of 37.5 mg BID. Will help BP and strengthen heart.

## 2015-01-01 ENCOUNTER — Encounter (HOSPITAL_COMMUNITY): Payer: Federal, State, Local not specified - PPO

## 2015-01-01 NOTE — Telephone Encounter (Signed)
Called pt to give Dr.Smith's response. lmctb

## 2015-01-03 ENCOUNTER — Encounter (HOSPITAL_COMMUNITY): Payer: Federal, State, Local not specified - PPO

## 2015-01-06 ENCOUNTER — Encounter (HOSPITAL_COMMUNITY): Payer: Federal, State, Local not specified - PPO

## 2015-01-06 NOTE — Telephone Encounter (Signed)
2nd attempt.lmtcb. Called tot give pt Dr.Smith's response below

## 2015-01-07 ENCOUNTER — Telehealth: Payer: Self-pay | Admitting: Interventional Cardiology

## 2015-01-07 NOTE — Telephone Encounter (Signed)
New message  ° ° °Patient calling back to speak with nurse  °

## 2015-01-07 NOTE — Telephone Encounter (Signed)
Returned pt call. Called pt cell # as requested. Unable to lmom. Pt voicemail has not been set up

## 2015-01-07 NOTE — Telephone Encounter (Signed)
Follow up      Pt now states that he does not have vm on his cell phone.  OK to call home number again and leave message with wife or on vm

## 2015-01-07 NOTE — Telephone Encounter (Signed)
Follow up     Calling to give a different number to call.  Please call pt on his cell

## 2015-01-07 NOTE — Telephone Encounter (Signed)
Called pt home # as requested. Left a detailed message as pt requested. Per Dr.Smith pt should increase Metoprolol to 37.5mg   Bid as previously instructed by Gaspar Bidding hager,PA. It will help with blood pressure and strengthen his heart. Pt should call back to discuss

## 2015-01-08 ENCOUNTER — Encounter (HOSPITAL_COMMUNITY)
Admission: RE | Admit: 2015-01-08 | Discharge: 2015-01-08 | Disposition: A | Discharge status: Home / Self Care | Payer: Federal, State, Local not specified - PPO | Source: Ambulatory Visit | Attending: Interventional Cardiology | Admitting: Interventional Cardiology

## 2015-01-08 DIAGNOSIS — Z955 Presence of coronary angioplasty implant and graft: Secondary | ICD-10-CM | POA: Diagnosis not present

## 2015-01-08 DIAGNOSIS — Z48812 Encounter for surgical aftercare following surgery on the circulatory system: Secondary | ICD-10-CM | POA: Insufficient documentation

## 2015-01-08 DIAGNOSIS — I252 Old myocardial infarction: Secondary | ICD-10-CM | POA: Diagnosis not present

## 2015-01-08 LAB — GLUCOSE, CAPILLARY
GLUCOSE-CAPILLARY: 129 mg/dL — AB (ref 65–99)
GLUCOSE-CAPILLARY: 124 mg/dL — AB (ref 65–99)

## 2015-01-08 NOTE — Progress Notes (Signed)
Pt started cardiac rehab today.  Pt tolerated light exercise without difficulty. VSS, telemetry-Sinus with a first degree heart block, asymptomatic.  Medication list reconciled.  Pt verbalized compliance with medications and denies barriers to compliance. PSYCHOSOCIAL ASSESSMENT:  PHQ-0. Pt exhibits positive coping skills, hopeful outlook with supportive family. No psychosocial needs identified at this time, no psychosocial interventions necessary.    Pt enjoys watching TV.   Pt cardiac rehab  goal is  to loose 10 lbs.  Pt encouraged to participate in exercising on your own to increase ability to achieve these goals.   Pt long term cardiac rehab goal is gain flexibility and loose 30 lbs.  Pt oriented to exercise equipment and routine.  Understanding verbalized.

## 2015-01-10 ENCOUNTER — Encounter (HOSPITAL_COMMUNITY): Payer: Federal, State, Local not specified - PPO

## 2015-01-13 ENCOUNTER — Encounter (HOSPITAL_COMMUNITY): Payer: Federal, State, Local not specified - PPO

## 2015-01-13 ENCOUNTER — Telehealth (HOSPITAL_COMMUNITY): Payer: Self-pay | Admitting: Internal Medicine

## 2015-01-15 ENCOUNTER — Encounter (HOSPITAL_COMMUNITY)
Admission: RE | Admit: 2015-01-15 | Discharge: 2015-01-15 | Disposition: A | Discharge status: Home / Self Care | Payer: Federal, State, Local not specified - PPO | Source: Ambulatory Visit | Attending: Interventional Cardiology | Admitting: Interventional Cardiology

## 2015-01-15 DIAGNOSIS — Z48812 Encounter for surgical aftercare following surgery on the circulatory system: Secondary | ICD-10-CM | POA: Diagnosis not present

## 2015-01-15 LAB — GLUCOSE, CAPILLARY
Glucose-Capillary: 114 mg/dL — ABNORMAL HIGH (ref 65–99)
Glucose-Capillary: 94 mg/dL (ref 65–99)

## 2015-01-17 ENCOUNTER — Encounter (HOSPITAL_COMMUNITY)
Admission: RE | Admit: 2015-01-17 | Discharge: 2015-01-17 | Disposition: A | Discharge status: Home / Self Care | Payer: Federal, State, Local not specified - PPO | Source: Ambulatory Visit | Attending: Interventional Cardiology | Admitting: Interventional Cardiology

## 2015-01-17 DIAGNOSIS — Z48812 Encounter for surgical aftercare following surgery on the circulatory system: Secondary | ICD-10-CM | POA: Diagnosis not present

## 2015-01-17 LAB — GLUCOSE, CAPILLARY
GLUCOSE-CAPILLARY: 135 mg/dL — AB (ref 65–99)
GLUCOSE-CAPILLARY: 147 mg/dL — AB (ref 65–99)

## 2015-01-20 ENCOUNTER — Encounter (HOSPITAL_COMMUNITY)
Admission: RE | Admit: 2015-01-20 | Discharge: 2015-01-20 | Disposition: A | Discharge status: Home / Self Care | Payer: Federal, State, Local not specified - PPO | Source: Ambulatory Visit | Attending: Interventional Cardiology | Admitting: Interventional Cardiology

## 2015-01-20 DIAGNOSIS — Z48812 Encounter for surgical aftercare following surgery on the circulatory system: Secondary | ICD-10-CM | POA: Diagnosis not present

## 2015-01-20 LAB — GLUCOSE, CAPILLARY: GLUCOSE-CAPILLARY: 149 mg/dL — AB (ref 65–99)

## 2015-01-22 ENCOUNTER — Other Ambulatory Visit: Payer: Self-pay | Admitting: *Deleted

## 2015-01-22 ENCOUNTER — Encounter: Payer: Self-pay | Admitting: *Deleted

## 2015-01-22 ENCOUNTER — Encounter (HOSPITAL_COMMUNITY)
Admission: RE | Admit: 2015-01-22 | Discharge: 2015-01-22 | Disposition: A | Discharge status: Home / Self Care | Payer: Federal, State, Local not specified - PPO | Source: Ambulatory Visit | Attending: Interventional Cardiology | Admitting: Interventional Cardiology

## 2015-01-22 ENCOUNTER — Telehealth: Payer: Self-pay | Admitting: Interventional Cardiology

## 2015-01-22 DIAGNOSIS — Z006 Encounter for examination for normal comparison and control in clinical research program: Secondary | ICD-10-CM

## 2015-01-22 DIAGNOSIS — Z48812 Encounter for surgical aftercare following surgery on the circulatory system: Secondary | ICD-10-CM | POA: Diagnosis not present

## 2015-01-22 LAB — GLUCOSE, CAPILLARY: GLUCOSE-CAPILLARY: 122 mg/dL — AB (ref 65–99)

## 2015-01-22 MED ORDER — AMBULATORY NON FORMULARY MEDICATION: 81.0000 mg | Freq: Every day | Status: DC

## 2015-01-22 NOTE — Progress Notes (Signed)
Patient was noted to have increased PVC's and two nonsustained runs of bigeminy. Blood pressure's were somewhat elevated with exercise. Patient admits to drinking two cups of coffee today prior to coming to exercise today.  [name] was instructed to limit his caffeine intake prior to coming to exercise at cardiac rehab. [name] at Dr [facility] office called and notified and talked with [name] over the phone. Exit blood pressure 132/84. Patient asymptomatic. Will fax exercise flow sheets to Dr. Thompson Caul office for review.

## 2015-01-22 NOTE — Telephone Encounter (Signed)
Spoke with [name] @ [facility] rehab she sts that pt was having PVC's during exercise. She will fax over strips for Dr.Smith to review.  Spoke with pt who I have been trying to reach. Pt adv of Dr.Smith's recommendation

## 2015-01-22 NOTE — Telephone Encounter (Signed)
New message      Pt is at cardiac rehab.  He is having PVC's.  Please call

## 2015-01-22 NOTE — Progress Notes (Signed)
TWILIGHT Research Study  3 month Randomization visit completed. Patient denies any adverse events or bleeding events. States compliant with study medication. Questions encouraged and answered bottle # [id number] ASA/PLACEBO; # [id number]; [id number] Brilinta dispensed. Next study visit due 02/27/15.

## 2015-01-22 NOTE — Progress Notes (Signed)
Reviewed home exercise guidelines with patient including endpoints, temperature precautions, target heart rate and rate of perceived exertion. Pt plans to walk as his mode of home exercise. Pt voices understanding of instructions given. Johnella Crumm M Donnell Wion, MS, ACSM CCEP  

## 2015-01-22 NOTE — Telephone Encounter (Signed)
con'td Per Dr.Smith Pt needs to increase metoprolol to a slightly higher dose of 37.5 mg BID. Will help BP and strengthen heart. Adv pt to call the office if he feels he is not tolerating the increase.  Pt was to have 2-3 mo f/u with Dr.Smith. appt scheduled for 1/10 @ 9:30am. Pt verbalized understanding.

## 2015-01-24 ENCOUNTER — Encounter (HOSPITAL_COMMUNITY)
Admission: RE | Admit: 2015-01-24 | Discharge: 2015-01-24 | Disposition: A | Discharge status: Home / Self Care | Payer: Federal, State, Local not specified - PPO | Source: Ambulatory Visit | Attending: Interventional Cardiology | Admitting: Interventional Cardiology

## 2015-01-24 DIAGNOSIS — Z48812 Encounter for surgical aftercare following surgery on the circulatory system: Secondary | ICD-10-CM | POA: Diagnosis not present

## 2015-01-24 LAB — GLUCOSE, CAPILLARY
GLUCOSE-CAPILLARY: 108 mg/dL — AB (ref 65–99)
GLUCOSE-CAPILLARY: 127 mg/dL — AB (ref 65–99)

## 2015-01-27 ENCOUNTER — Encounter (HOSPITAL_COMMUNITY)
Admission: RE | Admit: 2015-01-27 | Discharge: 2015-01-27 | Disposition: A | Discharge status: Home / Self Care | Payer: Federal, State, Local not specified - PPO | Source: Ambulatory Visit | Attending: Interventional Cardiology | Admitting: Interventional Cardiology

## 2015-01-27 DIAGNOSIS — Z48812 Encounter for surgical aftercare following surgery on the circulatory system: Secondary | ICD-10-CM | POA: Diagnosis not present

## 2015-01-27 LAB — GLUCOSE, CAPILLARY
GLUCOSE-CAPILLARY: 98 mg/dL (ref 65–99)
Glucose-Capillary: 120 mg/dL — ABNORMAL HIGH (ref 65–99)

## 2015-01-29 ENCOUNTER — Encounter (HOSPITAL_COMMUNITY)
Admission: RE | Admit: 2015-01-29 | Discharge: 2015-01-29 | Disposition: A | Discharge status: Home / Self Care | Payer: Federal, State, Local not specified - PPO | Source: Ambulatory Visit | Attending: Interventional Cardiology | Admitting: Interventional Cardiology

## 2015-01-29 DIAGNOSIS — Z48812 Encounter for surgical aftercare following surgery on the circulatory system: Secondary | ICD-10-CM | POA: Diagnosis not present

## 2015-01-29 LAB — GLUCOSE, CAPILLARY
GLUCOSE-CAPILLARY: 91 mg/dL (ref 65–99)
GLUCOSE-CAPILLARY: 144 mg/dL — AB (ref 65–99)

## 2015-01-29 NOTE — Progress Notes (Signed)
CBG 91 this afternoon. Patient was given a banana and lemonade. No exercise per protocol. Patient was counseled by the dietitian. Recheck CBG 144.  Will notify Gherge about the patients low CBG's at cardiac rehab. [name] brought his medication bottles in to cardiac rehab this afternoon. [name] says he is taking his metoprolol tartrate twice a day but not 12 hours apart. [name]'s blood pressures have been elevated the past two exercise sessions. Patient encouraged to take his metoprolol twice a day. Exit blood pressure 122/80. Heart rate 62. Will fax exercise flow sheets to Dr. Thompson Caul office for review. [name] has a follow up appointment with Dr Tamala Julian on November 9th. QUALITY OF LIFE SCORE REVIEW Patient score low in all areas except family with a  Total score of 18.37.  Scores less than 21 are considered low.  Patient quality of life slightly altered by physical constraints which limits ability to perform as prior to recent cardiac illness.  [name] feels dissatisfied with his help due having heart problems and diabetes.  Offered emotional support and reassurance.  Will continue to monitor and intervene as necessary.  [name] has a history of depression but denies being depressed at this time. Will forward quality of life questionnaire to Dr Thompson Caul office for review.

## 2015-01-29 NOTE — Progress Notes (Signed)
Nutrition Note Spoke with pt. Pt held Glipizide before exercise today. Pt reports eating only 2 chicken salad sandwiches (~60 gm CHO) today at lunch around 12:30 pm. Pt typically wakes up at 11 am and does not typically eat "breakfast." Pre-exercise CBG 91 mg/dL. Fasting CBG reportedly 130 mg/dL this morning. Pt encouraged to eat a serving of fruit about 30 min- 1 hr before exercise. Pt plans on continuing to hold Glipizide at this time to see if pre-exercise CBG's improve. Continue client-centered nutrition education by RD as part of interdisciplinary care.  Monitor and evaluate progress toward nutrition goal with team.  Derek Mound, M.Ed, RD, LDN, CDE 01/29/2015 3:37 PM

## 2015-01-31 ENCOUNTER — Other Ambulatory Visit: Payer: Self-pay | Admitting: *Deleted

## 2015-01-31 ENCOUNTER — Telehealth (HOSPITAL_COMMUNITY): Payer: Self-pay | Admitting: *Deleted

## 2015-01-31 ENCOUNTER — Encounter (HOSPITAL_COMMUNITY)
Admission: RE | Admit: 2015-01-31 | Discharge: 2015-01-31 | Disposition: A | Discharge status: Home / Self Care | Payer: Federal, State, Local not specified - PPO | Source: Ambulatory Visit | Attending: Interventional Cardiology | Admitting: Interventional Cardiology

## 2015-01-31 DIAGNOSIS — Z48812 Encounter for surgical aftercare following surgery on the circulatory system: Secondary | ICD-10-CM | POA: Diagnosis not present

## 2015-01-31 MED ORDER — GLIPIZIDE ER 5 MG PO TB24: 5.0000 mg | ORAL_TABLET | Freq: Every day | ORAL | Status: DC

## 2015-01-31 NOTE — Progress Notes (Signed)
Patient instructed to decrease his glipizide to 5 mg per day per Dr Renne Crigler. Patient states understanding. Blood sugars 111 pre exercise 155 post exercise. Will continue to monitor the patient throughout  the program.

## 2015-01-31 NOTE — Telephone Encounter (Signed)
-----   Message from [name], MD sent at 01/30/2015  5:00 PM EDT ----- Regarding: RE: low blood sugars at cardiac rehab Dear Verdis Frederickson, We will advise him to decrease Glipizide XL from 10 to 5 mg. I sent him a MyChart msg some time ago, but apparently he did not read it. Thank you, CG ----- Message -----    From: Magda Kiel, RN    Sent: 01/30/2015   1:20 PM      To: [name], MD, Elige Ko Callicutt, CMA Subject: low blood sugars at cardiac rehab              Good afternoon Dr Renne Crigler,  Daemyn's blood sugars have been below 90 on his past two exercise sessions at cardiac rehab. Josafat was not able to exercise yesterday.  Dr Cruzita Lederer, you mentioned in your office note that you would consider decreasing his glipizide if blood sugars started running low with exercise.  I have faxed Kerem's exercise flow sheets from cardiac rehab for your review.  Josie's blood sugars are also in Epic.  Thanks for your input.  Karron exercises from 2:45-4:00pm on Monday, Wednesday's and Fridays. Argil says he does not get up until 11:00 so he is eating one meal before coming to exercise  Sincerely,  Barnet Pall RN Cardiac rehab [phone]

## 2015-01-31 NOTE — Telephone Encounter (Signed)
Per Dr Cruzita Lederer, decrease Glipizide XL to 5 mg due to decrease in blood sugars.

## 2015-02-01 LAB — GLUCOSE, CAPILLARY
GLUCOSE-CAPILLARY: 155 mg/dL — AB (ref 65–99)
Glucose-Capillary: 111 mg/dL — ABNORMAL HIGH (ref 65–99)

## 2015-02-03 ENCOUNTER — Encounter (HOSPITAL_COMMUNITY): Payer: Federal, State, Local not specified - PPO

## 2015-02-05 ENCOUNTER — Encounter (HOSPITAL_COMMUNITY)
Admission: RE | Admit: 2015-02-05 | Discharge: 2015-02-05 | Disposition: A | Discharge status: Home / Self Care | Payer: Federal, State, Local not specified - PPO | Source: Ambulatory Visit | Attending: Interventional Cardiology | Admitting: Interventional Cardiology

## 2015-02-05 DIAGNOSIS — Z955 Presence of coronary angioplasty implant and graft: Secondary | ICD-10-CM | POA: Diagnosis not present

## 2015-02-05 DIAGNOSIS — I252 Old myocardial infarction: Secondary | ICD-10-CM | POA: Diagnosis not present

## 2015-02-05 DIAGNOSIS — Z48812 Encounter for surgical aftercare following surgery on the circulatory system: Secondary | ICD-10-CM | POA: Diagnosis present

## 2015-02-05 LAB — GLUCOSE, CAPILLARY
GLUCOSE-CAPILLARY: 126 mg/dL — AB (ref 65–99)
Glucose-Capillary: 170 mg/dL — ABNORMAL HIGH (ref 65–99)

## 2015-02-06 ENCOUNTER — Encounter: Payer: Self-pay | Admitting: Internal Medicine

## 2015-02-07 ENCOUNTER — Encounter: Payer: Self-pay | Admitting: Interventional Cardiology

## 2015-02-07 ENCOUNTER — Encounter: Payer: Self-pay | Admitting: *Deleted

## 2015-02-07 ENCOUNTER — Encounter (HOSPITAL_COMMUNITY)
Admission: RE | Admit: 2015-02-07 | Discharge: 2015-02-07 | Disposition: A | Discharge status: Home / Self Care | Payer: Federal, State, Local not specified - PPO | Source: Ambulatory Visit | Attending: Interventional Cardiology | Admitting: Interventional Cardiology

## 2015-02-07 DIAGNOSIS — Z006 Encounter for examination for normal comparison and control in clinical research program: Secondary | ICD-10-CM

## 2015-02-07 DIAGNOSIS — Z48812 Encounter for surgical aftercare following surgery on the circulatory system: Secondary | ICD-10-CM | POA: Diagnosis not present

## 2015-02-07 LAB — GLUCOSE, CAPILLARY: Glucose-Capillary: 159 mg/dL — ABNORMAL HIGH (ref 65–99)

## 2015-02-07 NOTE — Progress Notes (Addendum)
Telephone call from patient with TWILIGHT Research Study questions. Questions answered and encouraged to call again if problems or concerns. Verbalized understanding.

## 2015-02-10 ENCOUNTER — Encounter (HOSPITAL_COMMUNITY): Payer: Federal, State, Local not specified - PPO

## 2015-02-10 ENCOUNTER — Encounter (HOSPITAL_COMMUNITY)
Admission: RE | Admit: 2015-02-10 | Discharge: 2015-02-10 | Disposition: A | Discharge status: Home / Self Care | Payer: Federal, State, Local not specified - PPO | Source: Ambulatory Visit | Attending: Interventional Cardiology | Admitting: Interventional Cardiology

## 2015-02-10 DIAGNOSIS — Z48812 Encounter for surgical aftercare following surgery on the circulatory system: Secondary | ICD-10-CM | POA: Diagnosis not present

## 2015-02-10 LAB — GLUCOSE, CAPILLARY: Glucose-Capillary: 171 mg/dL — ABNORMAL HIGH (ref 65–99)

## 2015-02-12 ENCOUNTER — Encounter: Payer: Self-pay | Admitting: Interventional Cardiology

## 2015-02-12 ENCOUNTER — Encounter (HOSPITAL_COMMUNITY): Payer: Federal, State, Local not specified - PPO

## 2015-02-12 ENCOUNTER — Ambulatory Visit (INDEPENDENT_AMBULATORY_CARE_PROVIDER_SITE_OTHER): Payer: Federal, State, Local not specified - PPO | Admitting: Interventional Cardiology

## 2015-02-12 VITALS — BP 152/80 | HR 66 | Ht 69.0 in | Wt 254.4 lb

## 2015-02-12 DIAGNOSIS — E785 Hyperlipidemia, unspecified: Secondary | ICD-10-CM

## 2015-02-12 DIAGNOSIS — I2510 Atherosclerotic heart disease of native coronary artery without angina pectoris: Secondary | ICD-10-CM | POA: Diagnosis not present

## 2015-02-12 DIAGNOSIS — I10 Essential (primary) hypertension: Secondary | ICD-10-CM | POA: Diagnosis not present

## 2015-02-12 DIAGNOSIS — E1159 Type 2 diabetes mellitus with other circulatory complications: Secondary | ICD-10-CM | POA: Diagnosis not present

## 2015-02-12 MED ORDER — LOSARTAN POTASSIUM-HCTZ 100-12.5 MG PO TABS: 1.0000 | ORAL_TABLET | Freq: Every day | ORAL | Status: DC

## 2015-02-12 MED ORDER — METOPROLOL TARTRATE 25 MG PO TABS: 25.0000 mg | ORAL_TABLET | Freq: Two times a day (BID) | ORAL | Status: DC

## 2015-02-12 NOTE — Patient Instructions (Addendum)
Medication Instructions:  Your physician has recommended you make the following change in your medication:  1) STOP Losartan. START Losartan 100/12.5mg  daily. An Rx has been sent to your pharmacy 2) REDUCE Metoprolol to 25mg  twice daily.  Labwork: None   Testing/Procedures: None   Follow-Up: Your physician wants you to follow-up in: 6 months with Dr.Smith You will receive a reminder letter in the mail two months in advance. If you don't receive a letter, please call our office to schedule the follow-up appointment.   Any Other Special Instructions Will Be Listed Below (If Applicable).     If you need a refill on your cardiac medications before your next appointment, please call your pharmacy.

## 2015-02-12 NOTE — Progress Notes (Signed)
Cardiology Office Note   Date:  02/12/2015   ID:  [name], DOB 10-30-1950, MRN [id number]  PCP:  [name], MD  Cardiologist:  [name], MD   Chief Complaint  Patient presents with  . Coronary Artery Disease      History of Present Illness: [name] is a 64 y.o. male who presents for  Coronary disease with moderate multivessel involvement, stented mid circumflex during ACS, angioplasty of obtuse marginal #1 , diastolic left ventricular dysfunction, hypertension, and hyperlipidemia.   The patient was admitted to the hospital in August 2016 with unstable angina /non-ST elevation MI. Catheterization identified multivessel moderate disease with severe involvement of an obtuse marginal and circumflex mid segment. The circumflex was stented. The first obtuse marginal was angioplastied. There was technical difficulty with advancing a stent  into the first obtuse marginal branch.  He is now enrolled in cardiac rehabilitation and is doing well. He has not had angina. Cardiac rehabilitation reports of been promising/positive.    He is accompanied by his wife today and has no cardiopulmonary complaints.  Past Medical History  Diagnosis Date  . Anemia   . Type II or unspecified type diabetes mellitus without mention of complication, not stated as uncontrolled   . GERD (gastroesophageal reflux disease)   . Hyperlipidemia   . HTN (hypertension)   . Osteoarthritis   . Depression   . Adenomatous colon polyp 2011  . OSA on CPAP   . Sleep apnea     CPAP  . Chest pain     pleuritic  . Refusal of blood transfusions as patient is Jehovah's Witness     Past Surgical History  Procedure Laterality Date  . Pilonidal cyst excision    . Shoulder surgery      Right  . Cardiac catheterization  2004    patent coronary arteries  . Colonoscopy    . Polypectomy    . Cardiac catheterization N/A 11/06/2014    Procedure: Left Heart Cath and Coronary Angiography;   Surgeon: Belva Crome, MD;  Location: [facility];  Service: Cardiovascular;  Laterality: N/A;     Current Outpatient Prescriptions  Medication Sig Dispense Refill  . AMBULATORY NON FORMULARY MEDICATION Take 90 mg by mouth 2 (two) times daily. Medication Name: Brilinta 90 mg BID (TWILIGHT Study provided )    . AMBULATORY NON FORMULARY MEDICATION Take 81 mg by mouth daily. Medication Name: ASA 81 Mg daily or PLACEBO (TWILIGHT RESEARCH No open label asa, study provided)    . amLODipine (NORVASC) 10 MG tablet TAKE 1 TABLET (10 MG TOTAL) BY MOUTH DAILY. 180 tablet 1  . atorvastatin (LIPITOR) 40 MG tablet Take 1 tablet (40 mg total) by mouth daily. 90 tablet 3  . Blood Glucose Monitoring Suppl (RELION CONFIRM GLUCOSE MONITOR) W/DEVICE KIT 1 Device by Does not apply route once. 1 kit 0  . esomeprazole (NEXIUM) 40 MG capsule Take 1 capsule (40 mg total) by mouth daily at 12 noon. 30 capsule 3  . glipiZIDE (GLIPIZIDE XL) 5 MG 24 hr tablet Take 1 tablet (5 mg total) by mouth daily with breakfast. 30 tablet 2  . glucose blood (RELION ULTIMA TEST) test strip 1 each by Other route 2 (two) times daily. And lancets 2/day 100 each 12  . Insulin Pen Needle (NOVOFINE PLUS) 32G X 4 MM MISC Use 1x a day 100 each 11  . LANTUS SOLOSTAR 100 UNIT/ML Solostar Pen INJECT 20 UNITS INTO THE  SKIN DAILY AT 10 PM. 15 pen 2  . latanoprost (XALATAN) 0.005 % ophthalmic solution Place 1 drop into both eyes 2 (two) times daily.  5  . metFORMIN (GLUCOPHAGE) 1000 MG tablet Take 1 tablet (1,000 mg total) by mouth 2 (two) times daily with a meal. 180 tablet 2  . metoprolol tartrate (LOPRESSOR) 25 MG tablet Take 1 tablet (25 mg total) by mouth 2 (two) times daily.    . nitroGLYCERIN (NITROSTAT) 0.4 MG SL tablet Place 1 tablet (0.4 mg total) under the tongue every 5 (five) minutes as needed. 25 tablet 3  . traZODone (DESYREL) 50 MG tablet Take 1 tablet (50 mg total) by mouth at bedtime as needed for sleep. 30 tablet 3  .  losartan-hydrochlorothiazide (HYZAAR) 100-12.5 MG tablet Take 1 tablet by mouth daily. 30 tablet 11   No current facility-administered medications for this visit.    Allergies:   Statins    Social History:  The patient  reports that he quit smoking about 31 years ago. His smoking use included Cigarettes. He quit after 10 years of use. He has never used smokeless tobacco. He reports that he drinks alcohol. He reports that he does not use illicit drugs.   Family History:  The patient's family history includes Alcohol abuse in his other; Colon cancer in his paternal uncle; Coronary artery disease in his mother and other; Diabetes in his maternal uncle, mother, paternal uncle, and sister; Heart disease in his mother; Stroke in his mother. There is no history of Colon polyps or Esophageal cancer.    ROS:  Please see the history of present illness.   Otherwise, review of systems are positive for  Cough , anxiety , an occasional palpitation..   All other systems are reviewed and negative.    PHYSICAL EXAM: VS:  BP 152/80 mmHg  Pulse 66  Ht _0  (1.753 m)  Wt 115.395 kg (254 lb 6.4 oz)  BMI 37.55 kg/m2  SpO2 97% , BMI Body mass index is 37.55 kg/(m^2). GEN: Well nourished, well developed, in no acute distress HEENT: normal Neck: no JVD, carotid bruits, or masses Cardiac: RRR.  There is no murmur, rub, or gallop. There is no edema. Respiratory:  clear to auscultation bilaterally, normal work of breathing. GI: soft, nontender, nondistended, + BS MS: no deformity or atrophy Skin: warm and dry, no rash Neuro:  Strength and sensation are intact Psych: euthymic mood, full affect   EKG:  EKG is ordered today.   Recent Labs: 05/09/2014: ALT 17 12/20/2014: BUN 10; Creatinine, Ser 0.95; Hemoglobin 13.4; Platelets 239.0; Potassium 3.9; Pro B Natriuretic peptide (BNP) 25.0; Sodium 138    Lipid Panel    Component Value Date/Time   CHOL 201* 05/09/2014 0909   TRIG 69.0 05/09/2014 0909   HDL  45.80 05/09/2014 0909   CHOLHDL 4 05/09/2014 0909   VLDL 13.8 05/09/2014 0909   LDLCALC 141* 05/09/2014 0909   LDLDIRECT 168.0 12/18/2012 1340      Wt Readings from Last 3 Encounters:  02/12/15 115.395 kg (254 lb 6.4 oz)  12/26/14 113.4 kg (250 lb)  12/20/14 112.038 kg (247 lb)      Other studies Reviewed: Additional studies/ records that were reviewed today include: . The findings include .    ASSESSMENT AND PLAN:  1. Coronary artery disease involving native coronary artery of native heart without angina pectoris  continue phase II cardiac rehabilitation.  2. Essential hypertension  continue current medical regimen but increase control by adding  HCTZ 12.5 mg per day.  3. Hyperlipidemia  continue current therapy  4. Type 2 diabetes mellitus with other circulatory complications (HCC)  continue current therapy and exercise     Current medicines are reviewed at length with the patient today.  The patient has the following concerns regarding medicines:  We discussed increasing intensity of medication therapy to better control blood pressure..  The following changes/actions have been instituted:     Ideal blood pressures are 130/85 or less. Therefore we will change Cozaar to Hyzaar 100/12.5 mg per day.   Basic metabolic panel 2 weeks after adding HCTZ   Six-month clinical follow-up   Labs/ tests ordered today include:  No orders of the defined types were placed in this encounter.     Disposition:   FU with HS in 6 months  Signed, [name], MD  02/12/2015 6:59 PM    [facility] [address], [place], [place]  [address] Phone: [phone]; Fax: [phone]

## 2015-02-14 ENCOUNTER — Encounter (HOSPITAL_COMMUNITY)
Admission: RE | Admit: 2015-02-14 | Discharge: 2015-02-14 | Disposition: A | Discharge status: Home / Self Care | Payer: Federal, State, Local not specified - PPO | Source: Ambulatory Visit | Attending: Interventional Cardiology | Admitting: Interventional Cardiology

## 2015-02-14 DIAGNOSIS — Z48812 Encounter for surgical aftercare following surgery on the circulatory system: Secondary | ICD-10-CM | POA: Diagnosis not present

## 2015-02-14 LAB — GLUCOSE, CAPILLARY: Glucose-Capillary: 123 mg/dL — ABNORMAL HIGH (ref 65–99)

## 2015-02-17 ENCOUNTER — Encounter (HOSPITAL_COMMUNITY)
Admission: RE | Admit: 2015-02-17 | Discharge: 2015-02-17 | Disposition: A | Discharge status: Home / Self Care | Payer: Federal, State, Local not specified - PPO | Source: Ambulatory Visit | Attending: Interventional Cardiology | Admitting: Interventional Cardiology

## 2015-02-17 DIAGNOSIS — Z48812 Encounter for surgical aftercare following surgery on the circulatory system: Secondary | ICD-10-CM | POA: Diagnosis not present

## 2015-02-18 LAB — GLUCOSE, CAPILLARY: Glucose-Capillary: 149 mg/dL — ABNORMAL HIGH (ref 65–99)

## 2015-02-19 ENCOUNTER — Telehealth: Payer: Self-pay | Admitting: Nurse Practitioner

## 2015-02-19 ENCOUNTER — Encounter (HOSPITAL_COMMUNITY)
Admission: RE | Admit: 2015-02-19 | Discharge: 2015-02-19 | Disposition: A | Discharge status: Home / Self Care | Payer: Federal, State, Local not specified - PPO | Source: Ambulatory Visit | Attending: Interventional Cardiology | Admitting: Interventional Cardiology

## 2015-02-19 DIAGNOSIS — Z48812 Encounter for surgical aftercare following surgery on the circulatory system: Secondary | ICD-10-CM | POA: Diagnosis not present

## 2015-02-19 LAB — GLUCOSE, CAPILLARY: Glucose-Capillary: 150 mg/dL — ABNORMAL HIGH (ref 65–99)

## 2015-02-19 NOTE — Progress Notes (Signed)
Patient appears to be in a wenckebach rhythm this afternoon.  Patient asymptomatic. Vital signs stable. Danna Hefty NP called and notified. Today's ECG tracing faxed to Danna Hefty NP for evaluation. No new orders received. Will continue to monitor the patient throughout the program. Will fax today's ECG tracings over to Dr Thompson Caul office for review.

## 2015-02-19 NOTE — Telephone Encounter (Signed)
   Received call from Barnet Pall, RN @ [facility].  Mr. Hornback was noted to have asymptomatic mobitz I/wenckebach this afternoon.  Strips reviewed.  Ok to continue exercising without further intervention.  Caller verbalized understanding and was grateful for the call back.  Murray Hodgkins, NP 02/19/2015, 3:48 PM

## 2015-02-21 ENCOUNTER — Encounter (HOSPITAL_COMMUNITY): Payer: Federal, State, Local not specified - PPO

## 2015-02-21 ENCOUNTER — Telehealth (HOSPITAL_COMMUNITY): Payer: Self-pay | Admitting: Internal Medicine

## 2015-02-21 ENCOUNTER — Telehealth: Payer: Self-pay | Admitting: Interventional Cardiology

## 2015-02-21 NOTE — Telephone Encounter (Signed)
Pt c/o medication issue:  1. Name of Medication: Ticagrelor 90 mg 2x per day and Acetylsalicylic 81 mg 1x per day  2. How are you currently taking this medication (dosage and times per day)?   3. Are you having a reaction (difficulty breathing--STAT)?   4. What is your medication issue? Pt calling stating that his sinuses are irritated and every time he blows his nose there is blood out of both nostrils. Pt also states this medication is a part of a study.

## 2015-02-21 NOTE — Telephone Encounter (Signed)
Spoke with pt who is reporting an "accumulation of blood" in both nostrils, there is not a nose bleed however blood that he can clean out of his nose with a tissue on occasion throughout the day.   He states this occurred both yesterday and today.  Advised pt to continue his medications as listed, use saline nasal spray as needed to help keep mucous membranes moist and to call back or report to ED if nose bleed occurs that he can not get to stop.  He states understanding.  I will forward to Dr Tamala Julian for his knowledge and any other recommendation/orders.

## 2015-02-24 ENCOUNTER — Encounter (HOSPITAL_COMMUNITY)
Admission: RE | Admit: 2015-02-24 | Discharge: 2015-02-24 | Disposition: A | Discharge status: Home / Self Care | Payer: Federal, State, Local not specified - PPO | Source: Ambulatory Visit | Attending: Interventional Cardiology | Admitting: Interventional Cardiology

## 2015-02-24 DIAGNOSIS — Z48812 Encounter for surgical aftercare following surgery on the circulatory system: Secondary | ICD-10-CM | POA: Diagnosis not present

## 2015-02-25 ENCOUNTER — Encounter: Payer: Self-pay | Admitting: Interventional Cardiology

## 2015-02-26 ENCOUNTER — Encounter (HOSPITAL_COMMUNITY)
Admission: RE | Admit: 2015-02-26 | Discharge: 2015-02-26 | Disposition: A | Discharge status: Home / Self Care | Payer: Federal, State, Local not specified - PPO | Source: Ambulatory Visit | Attending: Interventional Cardiology | Admitting: Interventional Cardiology

## 2015-02-26 ENCOUNTER — Telehealth: Payer: Self-pay | Admitting: Interventional Cardiology

## 2015-02-26 DIAGNOSIS — Z48812 Encounter for surgical aftercare following surgery on the circulatory system: Secondary | ICD-10-CM | POA: Diagnosis not present

## 2015-02-26 NOTE — Progress Notes (Signed)
  30 day Psychosocial followup assessment  Patient psychosocial assessment reveals no barriers to cardiac rehab participation.   Patient does not continue to exhibit poor coping skills to deal with psychosocial concerns.  Patient doe feel like he is making progress towards cardiac rehab goals.  Patient reports health and activity level are improved in the past 30 days as evidenced by patient's reports of ability to complete his ADLS.   Patient reports feeling positive about current and projected progress toward cardiac rehab goals.  Patient's rate of progress towards goals is good. .  Will continue to monitor and evaluate progress toward psychosocial goals.   Goals in progress:      Help patient work toward returning to meaningful activities that improve patient's quality of life and are attainable.

## 2015-02-26 NOTE — Telephone Encounter (Signed)
Refer to lipid clinic for [facility]

## 2015-02-26 NOTE — Telephone Encounter (Signed)
Patient was at Cardiac Rehab when I spoke with him earlier. Will forward to [place] to follow up with him early next week.

## 2015-02-26 NOTE — Telephone Encounter (Signed)
New Message   Verdis Frederickson calling for Triage Rn  Because she states that the pt is not taking Nastatin because it is making him sick

## 2015-02-26 NOTE — Progress Notes (Signed)
Patient reports that he has not been taking his statin because it makes him feel sick.  Dr Thompson Caul office called and notified. Spoke with Pam RN.

## 2015-02-26 NOTE — Telephone Encounter (Signed)
I called and spoke with the patient at Cardiac Rehab. He states that he hasn't taken his atorvastatin in about a month as it "paralyzes" him. He has muscle and joint aches and just can't tolerate taking it. He saw his PCP last week who told him to take it regardless of how he feels, so he tried it again, but does not feel well. He states he has been on multiple statins. He symptoms have resolved off his current statin. I have advised him to stay off of this and I will forward the message to Dr. Tamala Julian and Lattie Haw for review. We will call him back with recommendations. He is agreeable.

## 2015-03-03 ENCOUNTER — Encounter (HOSPITAL_COMMUNITY)
Admission: RE | Admit: 2015-03-03 | Discharge: 2015-03-03 | Disposition: A | Discharge status: Home / Self Care | Payer: Federal, State, Local not specified - PPO | Source: Ambulatory Visit | Attending: Interventional Cardiology | Admitting: Interventional Cardiology

## 2015-03-03 DIAGNOSIS — Z48812 Encounter for surgical aftercare following surgery on the circulatory system: Secondary | ICD-10-CM | POA: Diagnosis not present

## 2015-03-03 NOTE — Telephone Encounter (Signed)
Called to give pt Dr.Smith's recommendation below. Unable to lmom. Pt voicemail not set up. Will attempt again later

## 2015-03-03 NOTE — Telephone Encounter (Signed)
Pt aware of Dr.Smith's recommendation below. Adv pt a  Scheduler from our office will call him to schedule a new pt lipid clinic appt. Pt verbalized understanding.

## 2015-03-05 ENCOUNTER — Encounter (HOSPITAL_COMMUNITY)
Admission: RE | Admit: 2015-03-05 | Discharge: 2015-03-05 | Disposition: A | Discharge status: Home / Self Care | Payer: Federal, State, Local not specified - PPO | Source: Ambulatory Visit | Attending: Interventional Cardiology | Admitting: Interventional Cardiology

## 2015-03-05 DIAGNOSIS — Z48812 Encounter for surgical aftercare following surgery on the circulatory system: Secondary | ICD-10-CM | POA: Diagnosis not present

## 2015-03-06 ENCOUNTER — Ambulatory Visit: Payer: Federal, State, Local not specified - PPO | Admitting: Pharmacist

## 2015-03-07 ENCOUNTER — Encounter (HOSPITAL_COMMUNITY)
Admission: RE | Admit: 2015-03-07 | Discharge: 2015-03-07 | Disposition: A | Discharge status: Home / Self Care | Payer: Federal, State, Local not specified - PPO | Source: Ambulatory Visit | Attending: Interventional Cardiology | Admitting: Interventional Cardiology

## 2015-03-07 DIAGNOSIS — Z955 Presence of coronary angioplasty implant and graft: Secondary | ICD-10-CM | POA: Insufficient documentation

## 2015-03-07 DIAGNOSIS — Z48812 Encounter for surgical aftercare following surgery on the circulatory system: Secondary | ICD-10-CM | POA: Insufficient documentation

## 2015-03-07 DIAGNOSIS — I252 Old myocardial infarction: Secondary | ICD-10-CM | POA: Diagnosis not present

## 2015-03-10 ENCOUNTER — Encounter (HOSPITAL_COMMUNITY): Payer: Federal, State, Local not specified - PPO

## 2015-03-12 ENCOUNTER — Encounter: Payer: Self-pay | Admitting: *Deleted

## 2015-03-12 ENCOUNTER — Encounter (HOSPITAL_COMMUNITY)
Admission: RE | Admit: 2015-03-12 | Discharge: 2015-03-12 | Disposition: A | Discharge status: Home / Self Care | Payer: Federal, State, Local not specified - PPO | Source: Ambulatory Visit | Attending: Interventional Cardiology | Admitting: Interventional Cardiology

## 2015-03-12 DIAGNOSIS — Z48812 Encounter for surgical aftercare following surgery on the circulatory system: Secondary | ICD-10-CM | POA: Diagnosis not present

## 2015-03-12 DIAGNOSIS — Z006 Encounter for examination for normal comparison and control in clinical research program: Secondary | ICD-10-CM

## 2015-03-12 NOTE — Progress Notes (Signed)
TWILIGHT research study 4 month telephone follow up completed. Patient denies any adverse or bleeding events. He states he is compliant with medication and has not taken any open label ASA, just what I provided him. Questions encouraged and answered. Next research visit due end of [facility] 2017

## 2015-03-14 ENCOUNTER — Encounter (HOSPITAL_COMMUNITY)
Admission: RE | Admit: 2015-03-14 | Discharge: 2015-03-14 | Disposition: A | Discharge status: Home / Self Care | Payer: Federal, State, Local not specified - PPO | Source: Ambulatory Visit | Attending: Interventional Cardiology | Admitting: Interventional Cardiology

## 2015-03-14 DIAGNOSIS — Z48812 Encounter for surgical aftercare following surgery on the circulatory system: Secondary | ICD-10-CM | POA: Diagnosis not present

## 2015-03-17 ENCOUNTER — Encounter (HOSPITAL_COMMUNITY): Payer: Federal, State, Local not specified - PPO

## 2015-03-18 ENCOUNTER — Ambulatory Visit: Payer: Federal, State, Local not specified - PPO | Admitting: Internal Medicine

## 2015-03-19 ENCOUNTER — Encounter (HOSPITAL_COMMUNITY)
Admission: RE | Admit: 2015-03-19 | Discharge: 2015-03-19 | Disposition: A | Discharge status: Home / Self Care | Payer: Federal, State, Local not specified - PPO | Source: Ambulatory Visit | Attending: Interventional Cardiology | Admitting: Interventional Cardiology

## 2015-03-19 ENCOUNTER — Other Ambulatory Visit: Payer: Self-pay | Admitting: Internal Medicine

## 2015-03-19 DIAGNOSIS — Z48812 Encounter for surgical aftercare following surgery on the circulatory system: Secondary | ICD-10-CM | POA: Diagnosis not present

## 2015-03-20 ENCOUNTER — Other Ambulatory Visit (INDEPENDENT_AMBULATORY_CARE_PROVIDER_SITE_OTHER): Payer: Federal, State, Local not specified - PPO | Admitting: *Deleted

## 2015-03-20 ENCOUNTER — Ambulatory Visit (INDEPENDENT_AMBULATORY_CARE_PROVIDER_SITE_OTHER): Payer: Federal, State, Local not specified - PPO | Admitting: Internal Medicine

## 2015-03-20 ENCOUNTER — Encounter: Payer: Self-pay | Admitting: Internal Medicine

## 2015-03-20 VITALS — BP 112/68 | HR 71 | Temp 97.5°F | Resp 12 | Wt 253.0 lb

## 2015-03-20 DIAGNOSIS — E1159 Type 2 diabetes mellitus with other circulatory complications: Secondary | ICD-10-CM

## 2015-03-20 DIAGNOSIS — Z794 Long term (current) use of insulin: Secondary | ICD-10-CM | POA: Diagnosis not present

## 2015-03-20 LAB — POCT GLYCOSYLATED HEMOGLOBIN (HGB A1C): Hemoglobin A1C: 7.2

## 2015-03-20 MED ORDER — GLIPIZIDE ER 5 MG PO TB24: 5.0000 mg | ORAL_TABLET | Freq: Every day | ORAL | Status: DC

## 2015-03-20 NOTE — Progress Notes (Signed)
Patient ID: [name], male   DOB: [birth year], 64 y.o.   MRN: [id number]  HPI: [name] is a 64 y.o.-year-old male, returning for f/u for DM2, dx 2007, insulin-dependent since 2014, uncontrolled, with complications (+ ED). Last visit 3 mo ago. PCP: Dr Odette Fraction.  Pt was admitted for CP in 11/2014: NSTEMI >> CAD >> PTCA >> stent placed. He is in cardiac rehab MWF >> WF at 2:45 pm. He is told he cannot exercise if sugars 120 or less >> if so, he is given bananas, sweet drinks.   Last hemoglobin A1c was: Lab Results  Component Value Date   HGBA1C 7.4 12/17/2014   HGBA1C 7.1* 08/21/2014   HGBA1C 9.4* 05/09/2014   Pt is on a regimen of: - Metformin 1000 mg 2x a day - Glipizide XL 5 >> 10 >> 5 mg daily in am (decreased as he was having low CBGs in am) - Lantus (did not like Toujeo) 20 units at bedtime - started 07/2014  He was Levemir 140 units in am >> decreased to 70 units>> stopped as this was too expensive for him He stopped Welchol 3.7g at bedtime a day - at last  Stopped Farxiga 10 mg b/c frequent urination and urgency. He was on Glipizide in the past. He was on Metformin in the past >> no N/V.   Pt checks his sugars 2x a day and they are: - am: 79-117, 142 >> 49x1 (today), 89-130, 142 >> 90, 166-223, 257, 306 >> 90-163 >> 99-140 >> 120-128 - 2h after b'fast: 150s >> 102, 146, 171, 191 >> n/c >> 128, 134 >> n/c - before lunch: n/c >> 112-168 (mostly on the low limit) >> n/c >> 120, 224-245 >> 206 >> n/c >> 140s  - 2h after lunch: n/c >> 210 (corrected the 79 in am) >> n/c >> 144, 205 >> n/c - before dinner: n/c >> 143 >> n/c >> 134-225, 263 >> 118-205 >> 140s >> 140s - 2h after dinner: n/c >> 143, 175 >> n/c  - bedtime: n/c >> 105-165, 185 >> 99-170 >> 160s - nighttime: n/c >> 134, 210 >> 99-190 >> 94, 190-305 >> 148-216 >> n/c No lows.Lowest sugar was 90; ? has hypoglycemia awareness  Highest sugar was 360 >> 190 >> 305 >> 200s.  - + mild CKD, last BUN/creatinine:   Lab Results  Component Value Date   BUN 10 12/20/2014   CREATININE 0.95 12/20/2014  On Losartan. - last set of lipids: Lab Results  Component Value Date   CHOL 201* 05/09/2014   HDL 45.80 05/09/2014   LDLCALC 141* 05/09/2014   LDLDIRECT 168.0 12/18/2012   TRIG 69.0 05/09/2014   CHOLHDL 4 05/09/2014  On Pravastatin. - last eye exam was in 12/17/2014. No DR.  - no numbness and tingling in his feet.  I reviewed pt's medications, allergies, PMH, social hx, family hx, and changes were documented in the history of present illness. Otherwise, unchanged from my initial visit note.   ROS: Constitutional: + weight gain, + fatigue, no subjective hyperthermia/hypothermia, + nocturia Eyes: no blurry vision, no xerophthalmia ENT: no sore throat, no nodules palpated in throat, no dysphagia/odynophagia, no hoarseness Cardiovascular: no CP/SOB/palpitations/+ leg swelling Respiratory: no cough/SOB Gastrointestinal: no N/V/D/C Musculoskeletal: no muscle/+ joint aches Skin: no rashes Neurological: no tremors/numbness/tingling/dizziness  PE: BP 112/68 mmHg  Pulse 71  Temp(Src) 97.5 F (36.4 C) (Oral)  Resp 12  Wt 253 lb (114.76 kg)  SpO2 99% Body mass index is 37.34  kg/(m^2). Wt Readings from Last 3 Encounters:  03/20/15 253 lb (114.76 kg)  02/12/15 254 lb 6.4 oz (115.395 kg)  12/26/14 250 lb (113.4 kg)   Constitutional: overweight, in NAD Eyes: PERRLA, EOMI, no exophthalmos ENT: moist mucous membranes, no thyromegaly, no cervical lymphadenopathy Cardiovascular: irreg irreg R, No MRG, + RLE edema (h/o knee surgery), but improved Respiratory: CTA B Gastrointestinal: abdomen soft, NT, ND, BS+ Musculoskeletal: no deformities, strength intact in all 4 Skin: moist, warm, no rashes Neurological: no tremor with outstretched hands, DTR normal in all 4  ASSESSMENT: 1. DM2, insulin-dependent, uncontrolled, with complications - CAD, s/p NSTEMI, s/p stent 11/2014 - ED  PLAN:  1. Patient  with long-standing, uncontrolled diabetes, with now better control. No fluctuations. He is usually higher than target at lunchtime (he does not eat b'fast, drinks coffee), but this is preferred before his rehab sessions... Will continue current regimen. - I suggested to:  Patient Instructions  Please continue: - Lantus 20 units at bedtime - Glipizide XL 5 mg in am - Metformin 1000 mg 2x a day with meals.  Please return in 3 months with your sugar log.   - continue checking sugars at different times of the day - check 2 times a day, rotating checks - advised for yearly eye exams, he is up to date  - check Hba1c today >> 7.2% (better)! - Return to clinic in 3 mo with sugar log

## 2015-03-20 NOTE — Patient Instructions (Signed)
Patient Instructions  Please continue: - Lantus 20 units at bedtime - Glipizide XL 5 mg in am - Metformin 1000 mg 2x a day with meals.  Please return in 3 months with your sugar log.

## 2015-03-21 ENCOUNTER — Encounter (HOSPITAL_COMMUNITY)
Admission: RE | Admit: 2015-03-21 | Discharge: 2015-03-21 | Disposition: A | Discharge status: Home / Self Care | Payer: Federal, State, Local not specified - PPO | Source: Ambulatory Visit | Attending: Interventional Cardiology | Admitting: Interventional Cardiology

## 2015-03-21 DIAGNOSIS — Z48812 Encounter for surgical aftercare following surgery on the circulatory system: Secondary | ICD-10-CM | POA: Diagnosis not present

## 2015-03-24 ENCOUNTER — Encounter (HOSPITAL_COMMUNITY): Payer: Federal, State, Local not specified - PPO

## 2015-03-26 ENCOUNTER — Encounter (HOSPITAL_COMMUNITY)
Admission: RE | Admit: 2015-03-26 | Discharge: 2015-03-26 | Disposition: A | Discharge status: Home / Self Care | Payer: Federal, State, Local not specified - PPO | Source: Ambulatory Visit | Attending: Interventional Cardiology | Admitting: Interventional Cardiology

## 2015-03-26 DIAGNOSIS — Z48812 Encounter for surgical aftercare following surgery on the circulatory system: Secondary | ICD-10-CM | POA: Diagnosis not present

## 2015-03-28 ENCOUNTER — Encounter (HOSPITAL_COMMUNITY)
Admission: RE | Admit: 2015-03-28 | Discharge: 2015-03-28 | Disposition: A | Discharge status: Home / Self Care | Payer: Federal, State, Local not specified - PPO | Source: Ambulatory Visit | Attending: Interventional Cardiology | Admitting: Interventional Cardiology

## 2015-03-28 DIAGNOSIS — Z48812 Encounter for surgical aftercare following surgery on the circulatory system: Secondary | ICD-10-CM | POA: Diagnosis not present

## 2015-04-02 ENCOUNTER — Encounter (HOSPITAL_COMMUNITY)
Admission: RE | Admit: 2015-04-02 | Discharge: 2015-04-02 | Disposition: A | Discharge status: Home / Self Care | Payer: Federal, State, Local not specified - PPO | Source: Ambulatory Visit | Attending: Interventional Cardiology | Admitting: Interventional Cardiology

## 2015-04-02 DIAGNOSIS — Z48812 Encounter for surgical aftercare following surgery on the circulatory system: Secondary | ICD-10-CM | POA: Diagnosis not present

## 2015-04-04 ENCOUNTER — Encounter (HOSPITAL_COMMUNITY)
Admission: RE | Admit: 2015-04-04 | Discharge: 2015-04-04 | Disposition: A | Discharge status: Home / Self Care | Payer: Federal, State, Local not specified - PPO | Source: Ambulatory Visit | Attending: Interventional Cardiology | Admitting: Interventional Cardiology

## 2015-04-04 DIAGNOSIS — Z48812 Encounter for surgical aftercare following surgery on the circulatory system: Secondary | ICD-10-CM | POA: Diagnosis not present

## 2015-04-09 ENCOUNTER — Encounter (HOSPITAL_COMMUNITY)
Admission: RE | Admit: 2015-04-09 | Discharge: 2015-04-09 | Disposition: A | Discharge status: Home / Self Care | Payer: Federal, State, Local not specified - PPO | Source: Ambulatory Visit | Attending: Interventional Cardiology | Admitting: Interventional Cardiology

## 2015-04-09 DIAGNOSIS — Z955 Presence of coronary angioplasty implant and graft: Secondary | ICD-10-CM | POA: Diagnosis not present

## 2015-04-09 DIAGNOSIS — Z48812 Encounter for surgical aftercare following surgery on the circulatory system: Secondary | ICD-10-CM | POA: Diagnosis not present

## 2015-04-09 DIAGNOSIS — I252 Old myocardial infarction: Secondary | ICD-10-CM | POA: Diagnosis not present

## 2015-04-11 ENCOUNTER — Encounter (HOSPITAL_COMMUNITY)
Admission: RE | Admit: 2015-04-11 | Discharge: 2015-04-11 | Disposition: A | Discharge status: Home / Self Care | Payer: Federal, State, Local not specified - PPO | Source: Ambulatory Visit | Attending: Interventional Cardiology | Admitting: Interventional Cardiology

## 2015-04-11 DIAGNOSIS — Z48812 Encounter for surgical aftercare following surgery on the circulatory system: Secondary | ICD-10-CM | POA: Diagnosis not present

## 2015-04-14 ENCOUNTER — Encounter (HOSPITAL_COMMUNITY): Payer: Federal, State, Local not specified - PPO

## 2015-04-15 ENCOUNTER — Encounter: Payer: Self-pay | Admitting: Interventional Cardiology

## 2015-04-15 ENCOUNTER — Ambulatory Visit (INDEPENDENT_AMBULATORY_CARE_PROVIDER_SITE_OTHER): Payer: Federal, State, Local not specified - PPO | Admitting: Interventional Cardiology

## 2015-04-15 VITALS — BP 154/70 | HR 75 | Ht 70.0 in | Wt 256.8 lb

## 2015-04-15 DIAGNOSIS — R1013 Epigastric pain: Secondary | ICD-10-CM

## 2015-04-15 DIAGNOSIS — I10 Essential (primary) hypertension: Secondary | ICD-10-CM | POA: Diagnosis not present

## 2015-04-15 DIAGNOSIS — E785 Hyperlipidemia, unspecified: Secondary | ICD-10-CM | POA: Diagnosis not present

## 2015-04-15 DIAGNOSIS — K30 Functional dyspepsia: Secondary | ICD-10-CM

## 2015-04-15 DIAGNOSIS — I2510 Atherosclerotic heart disease of native coronary artery without angina pectoris: Secondary | ICD-10-CM | POA: Diagnosis not present

## 2015-04-15 DIAGNOSIS — R0602 Shortness of breath: Secondary | ICD-10-CM

## 2015-04-15 NOTE — Progress Notes (Signed)
  Cardiology Office Note   Date:  04/15/2015   ID:  [name], DOB 07/05/1950, MRN [id number]  PCP:  Richard W Tisovec, MD  Cardiologist:  Whitni Pasquini III,Annalicia Renfrew W, MD   Chief Complaint  Patient presents with  . Gastroesophageal Reflux      History of Present Illness: [name] is a 64 y.o. male who presents for CAD, circumflex DES, obtuse marginal not stented due to inability of stent to track (likely due to wire tangle).  Increasingly sedentary. He feels depressed. He enjoys cardiac rehabilitation. He has not limited and rehabilitation by chest discomfort other complaints. For the past 2-4 days he has been troubled by "indigestion". It causes a lot of belching. He can last for hours. It is not made worse by physical activity.    Past Medical History  Diagnosis Date  . Anemia   . Type II or unspecified type diabetes mellitus without mention of complication, not stated as uncontrolled   . GERD (gastroesophageal reflux disease)   . Hyperlipidemia   . HTN (hypertension)   . Osteoarthritis   . Depression   . Adenomatous colon polyp 2011  . OSA on CPAP   . Sleep apnea     CPAP  . Chest pain     pleuritic  . Refusal of blood transfusions as patient is Jehovah's Witness     Past Surgical History  Procedure Laterality Date  . Pilonidal cyst excision    . Shoulder surgery      Right  . Cardiac catheterization  2004    patent coronary arteries  . Colonoscopy    . Polypectomy    . Cardiac catheterization N/A 11/06/2014    Procedure: Left Heart Cath and Coronary Angiography;  Surgeon: Jariya Reichow W  Barton, MD;  Location: [facility];  Service: Cardiovascular;  Laterality: N/A;     Current Outpatient Prescriptions  Medication Sig Dispense Refill  . AMBULATORY NON FORMULARY MEDICATION Take 90 mg by mouth 2 (two) times daily. Medication Name: Brilinta 90 mg BID (TWILIGHT Study provided )    . AMBULATORY NON FORMULARY MEDICATION Take 81 mg by mouth daily. Medication  Name: ASA 81 Mg daily or PLACEBO (TWILIGHT RESEARCH No open label asa, study provided)    . amLODipine (NORVASC) 10 MG tablet TAKE 1 TABLET (10 MG TOTAL) BY MOUTH DAILY. 180 tablet 1  . Blood Glucose Monitoring Suppl (RELION CONFIRM GLUCOSE MONITOR) W/DEVICE KIT 1 Device by Does not apply route once. 1 kit 0  . esomeprazole (NEXIUM) 40 MG capsule Take 1 capsule (40 mg total) by mouth daily at 12 noon. 30 capsule 3  . glipiZIDE (GLIPIZIDE XL) 5 MG 24 hr tablet Take 1 tablet (5 mg total) by mouth daily with breakfast. 90 tablet 2  . glucose blood (RELION ULTIMA TEST) test strip 1 each by Other route 2 (two) times daily. And lancets 2/day 100 each 12  . Insulin Pen Needle (NOVOFINE PLUS) 32G X 4 MM MISC Use 1x a day 100 each 11  . LANTUS SOLOSTAR 100 UNIT/ML Solostar Pen INJECT 20 UNITS INTO THE SKIN DAILY AT 10 PM. 15 pen 2  . latanoprost (XALATAN) 0.005 % ophthalmic solution Place 1 drop into both eyes 2 (two) times daily.  5  . losartan-hydrochlorothiazide (HYZAAR) 100-12.5 MG tablet Take 1 tablet by mouth daily. 30 tablet 11  . metFORMIN (GLUCOPHAGE) 1000 MG tablet Take 1 tablet (1,000 mg total) by mouth 2 (two) times daily with a meal. 180 tablet 2  .   metoprolol tartrate (LOPRESSOR) 25 MG tablet Take 1 tablet (25 mg total) by mouth 2 (two) times daily.    . nitroGLYCERIN (NITROSTAT) 0.4 MG SL tablet Place 0.4 mg under the tongue every 5 (five) minutes x 3 doses as needed. (CHEST PAIN)    . tiZANidine (ZANAFLEX) 4 MG tablet Take 4 mg by mouth every 8 (eight) hours as needed. (MUSCLE SPASMS)  2  . traZODone (DESYREL) 50 MG tablet Take 1 tablet (50 mg total) by mouth at bedtime as needed for sleep. 30 tablet 3   No current facility-administered medications for this visit.    Allergies:   Statins    Social History:  The patient  reports that he quit smoking about 32 years ago. His smoking use included Cigarettes. He quit after 10 years of use. He has never used smokeless tobacco. He reports that  he drinks alcohol. He reports that he does not use illicit drugs.   Family History:  The patient's family history includes Alcohol abuse in his other; Colon cancer in his paternal uncle; Coronary artery disease in his mother and other; Diabetes in his maternal uncle, mother, paternal uncle, and sister; Heart disease in his mother; Stroke in his mother. There is no history of Colon polyps or Esophageal cancer.    ROS:  Please see the history of present illness.   Otherwise, review of systems are positive for depression, leg swelling, facial swelling, fatigue, orthopnea, anxiety, back discomfort, muscle pain.   All other systems are reviewed and negative.    PHYSICAL EXAM: VS:  BP 154/70 mmHg  Pulse 75  Ht 5' 10" (1.778 m)  Wt 256 lb 12.8 oz (116.484 kg)  BMI 36.85 kg/m2 , BMI Body mass index is 36.85 kg/(m^2). GEN: Well nourished, well developed, in no acute distress HEENT: normal Neck: no JVD, carotid bruits, or masses Cardiac: RRR.  There is no murmur, rub, or gallop. There is no edema. Respiratory:  clear to auscultation bilaterally, normal work of breathing. GI: soft, nontender, nondistended, + BS MS: no deformity or atrophy Skin: warm and dry, no rash Neuro:  Strength and sensation are intact Psych: euthymic mood, full affect   EKG:  EKG is not ordered today.    Recent Labs: 05/09/2014: ALT 17 12/20/2014: BUN 10; Creatinine, Ser 0.95; Hemoglobin 13.4; Platelets 239.0; Potassium 3.9; Pro B Natriuretic peptide (BNP) 25.0; Sodium 138    Lipid Panel    Component Value Date/Time   CHOL 201* 05/09/2014 0909   TRIG 69.0 05/09/2014 0909   HDL 45.80 05/09/2014 0909   CHOLHDL 4 05/09/2014 0909   VLDL 13.8 05/09/2014 0909   LDLCALC 141* 05/09/2014 0909   LDLDIRECT 168.0 12/18/2012 1340      Wt Readings from Last 3 Encounters:  04/15/15 256 lb 12.8 oz (116.484 kg)  03/20/15 253 lb (114.76 kg)  02/12/15 254 lb 6.4 oz (115.395 kg)      Other studies Reviewed: Additional  studies/ records that were reviewed today include: None. The findings include reviewed cardiac rehabilitation reports which are benign.    ASSESSMENT AND PLAN:  1. Coronary artery disease involving native coronary artery of native heart without angina pectoris Residual CAD. No exertional discomfort.  2. Essential hypertension Borderline control   3. Hyperlipidemia with target LDL less than 100 He stopped statin therapy cause of musculoskeletal syndrome. He has decided against PCS K-9  4. Indigestion Rule out anginal equivalent   Current medicines are reviewed at length with the patient today.  The patient   has the following concerns regarding medicines: Unable to use statins and unwilling to institute PCS K-9 therapy due to expense..  The following changes/actions have been instituted:    We will nitroglycerin for recurring episodes of indigestion. If nitroglycerin improves the discomfort, we will consider repeat cardiac cath. He has residual CAD.  Labs/ tests ordered today include:  No orders of the defined types were placed in this encounter.     Disposition:   FU with HS in 6 months  Signed, Vasil Juhasz III,Manuel Lawhead W, MD  04/15/2015 10:16 AM    [facility] [address], [place], [place]  [address] Phone: [phone]; Fax: [phone]   

## 2015-04-15 NOTE — Patient Instructions (Signed)
Medication Instructions:  Your physician recommends that you continue on your current medications as directed. Please refer to the Current Medication list given to you today.   Labwork: None ordered  Testing/Procedures: None ordered  Follow-Up: Your physician wants you to follow-up in: 6 months You will receive a reminder letter in the mail two months in advance. If you don't receive a letter, please call our office to schedule the follow-up appointment.   Any Other Special Instructions Will Be Listed Below (If Applicable). Try using Nitro for the chest tightness you are having, let us know if the symptoms resolve with Nitro use     If you need a refill on your cardiac medications before your next appointment, please call your pharmacy.

## 2015-04-16 ENCOUNTER — Encounter (HOSPITAL_COMMUNITY)
Admission: RE | Admit: 2015-04-16 | Discharge: 2015-04-16 | Disposition: A | Discharge status: Home / Self Care | Payer: Federal, State, Local not specified - PPO | Source: Ambulatory Visit | Attending: Interventional Cardiology | Admitting: Interventional Cardiology

## 2015-04-16 DIAGNOSIS — Z48812 Encounter for surgical aftercare following surgery on the circulatory system: Secondary | ICD-10-CM | POA: Diagnosis not present

## 2015-04-16 NOTE — Progress Notes (Signed)
[name] 65 y.o. male Nutrition Note Spoke with pt. Nutrition Plan and Nutrition Survey goals reviewed with pt. Pt is following Step 2 of the Therapeutic Lifestyle Changes diet. Pt wants to lose wt. Pt wt today 115.2 kg, which is up 1.8 kg since admission. Wt loss tips reviewed. Pt is diabetic. Last A1c is improved from 12/17/14 A1c of 7.4 and indicates blood glucose continues to be sub-optimally controlled. This Probation officer went over Diabetes Education test results. Pt checks CBG's 2 times a day. Fasting CBG's reportedly "around 106 mg/dL." Pt expressed understanding of the information reviewed. Pt aware of nutrition education classes offered.  Lab Results  Component Value Date   HGBA1C 7.2 03/20/2015   Nutrition Diagnosis ? Food-and nutrition-related knowledge deficit related to lack of exposure to information as related to diagnosis of: ? CVD ? DM ? Obesity related to excessive energy intake as evidenced by a BMI of 36.7  Nutrition RX/ Estimated Daily Nutrition Needs for: wt loss 1700-2200 kcal, 45-60 gm fat, 11-15 gm sat fat, 1.6-2.2 gm trans-fat, <1500 mg sodium, 250-325 gm CHO   Nutrition Intervention ? Pt's individual nutrition plan reviewed with pt. ? Pt encouraged to discuss GLP-1 RA with his MD to help promote wt loss and improve CBG control ? Benefits of adopting Therapeutic Lifestyle Changes discussed when Medficts reviewed. ? Pt to attend the Portion Distortion class ? Pt to attend the Diabetes Q & A class ? Pt to attend the   ? Nutrition I class                  ? Nutrition II class     ? Diabetes Blitz class ? Pt given handouts for: ? Nutrition I class ? Nutrition II class ? Diabetes Blitz Class ? Continue client-centered nutrition education by RD, as part of interdisciplinary care. Goal(s) ? Pt to identify food quantities necessary to achieve: ? wt loss to a goal wt of 226-244 lb (102.5-110.7 kg) at graduation from cardiac rehab.  ? CBG concentrations in the normal range  or as close to normal as is safely possible. ? Use pre-meal and post-meal CBG's and A1c to determine whether adjustments in food/meal planning will be beneficial or if any meds need to be combined with nutrition therapy. Monitor and Evaluate progress toward nutrition goal with team. Nutrition Risk:  Change to Moderate Derek Mound, M.Ed, RD, LDN, CDE 04/16/2015 3:53 PM

## 2015-04-18 ENCOUNTER — Encounter (HOSPITAL_COMMUNITY)
Admission: RE | Admit: 2015-04-18 | Discharge: 2015-04-18 | Disposition: A | Discharge status: Home / Self Care | Payer: Federal, State, Local not specified - PPO | Source: Ambulatory Visit | Attending: Interventional Cardiology | Admitting: Interventional Cardiology

## 2015-04-18 DIAGNOSIS — Z48812 Encounter for surgical aftercare following surgery on the circulatory system: Secondary | ICD-10-CM | POA: Diagnosis not present

## 2015-04-21 ENCOUNTER — Encounter (HOSPITAL_COMMUNITY): Payer: Federal, State, Local not specified - PPO

## 2015-04-22 ENCOUNTER — Telehealth: Payer: Self-pay | Admitting: Interventional Cardiology

## 2015-04-22 DIAGNOSIS — Z01812 Encounter for preprocedural laboratory examination: Secondary | ICD-10-CM

## 2015-04-22 NOTE — Telephone Encounter (Signed)
New problem  Pt's wife calling:    Pt is still having the same problem he was having before and wife wants pt to have another procedure done to make sure pt is aright.

## 2015-04-22 NOTE — Telephone Encounter (Signed)
Returned pt wife call. She sts that pt is currently asymptomatic. Pt did have some discomfort "indigestion"yesterday, pt took 2 Nitro-glycerin tab and symptoms relieved. Pt called to see if Dr.Smith wanted pt to proceed with a repeat cardiac cath as he discussed with the pt at his last o/v. Adv her that Dr.Smith is out of the office all week. Offered a flex appt with a PA for tomorrow. Pt wife refused, they would rather wait for Dr.Smith. Adv pt wife I will fwd an update to Dr.Smith and callback with his response on his return.  Adv that pt use Nitro for "indigestion" chest pain as instructed previously.pt should go to the ED if any prolonged chest pain. Pt wife voiced appreciation, agreeable with plan, and verbalized understanding.

## 2015-04-23 ENCOUNTER — Encounter (HOSPITAL_COMMUNITY)
Admission: RE | Admit: 2015-04-23 | Discharge: 2015-04-23 | Disposition: A | Discharge status: Home / Self Care | Payer: Federal, State, Local not specified - PPO | Source: Ambulatory Visit | Attending: Interventional Cardiology | Admitting: Interventional Cardiology

## 2015-04-23 DIAGNOSIS — Z48812 Encounter for surgical aftercare following surgery on the circulatory system: Secondary | ICD-10-CM | POA: Diagnosis not present

## 2015-04-23 NOTE — Progress Notes (Signed)
Intermittent exertional blood pressure elevations noted today with exercise today. [name] said he did not take his beta blocker this morning. Patient instructed to take his medication as prescribed.  Exit blood pressure within normal limits. Will continue to monitor the patient throughout  the program.

## 2015-04-25 ENCOUNTER — Encounter (HOSPITAL_COMMUNITY)
Admission: RE | Admit: 2015-04-25 | Discharge: 2015-04-25 | Disposition: A | Discharge status: Home / Self Care | Payer: Federal, State, Local not specified - PPO | Source: Ambulatory Visit | Attending: Interventional Cardiology | Admitting: Interventional Cardiology

## 2015-04-25 DIAGNOSIS — Z48812 Encounter for surgical aftercare following surgery on the circulatory system: Secondary | ICD-10-CM | POA: Diagnosis not present

## 2015-04-25 NOTE — Telephone Encounter (Signed)
Set up cath and possible PCI. He has an untreated OM branch that is culprit for the complaints.

## 2015-04-28 ENCOUNTER — Encounter (HOSPITAL_COMMUNITY): Payer: Federal, State, Local not specified - PPO

## 2015-04-28 NOTE — Telephone Encounter (Signed)
Called to give Dr.Smith's response. lmtcb x2

## 2015-04-28 NOTE — Telephone Encounter (Signed)
Pt aware of Dr.Smith's recommendation. Set up cath and possible PCI. He has an untreated OM branch that is culprit for the complaints  Pt aware cardiac cath scheduled with Dr.Smith for 05/01/15 @ 9am. Pt will come to the office on 1/25 for pre-procedure labs. Pt given verbal pre-procedure labs. Written instruction left at the front desk for pt to pick up when he comes in for labs. Pt and pt wife verbalized instruction and voiced appreciation

## 2015-04-28 NOTE — Telephone Encounter (Signed)
Follow up  ° ° ° °Returning call back to nurse  °

## 2015-04-29 ENCOUNTER — Other Ambulatory Visit: Payer: Self-pay | Admitting: Interventional Cardiology

## 2015-04-29 DIAGNOSIS — I209 Angina pectoris, unspecified: Secondary | ICD-10-CM

## 2015-04-30 ENCOUNTER — Encounter: Payer: Self-pay | Admitting: *Deleted

## 2015-04-30 ENCOUNTER — Encounter (HOSPITAL_COMMUNITY)
Admission: RE | Admit: 2015-04-30 | Discharge: 2015-04-30 | Disposition: A | Discharge status: Home / Self Care | Payer: Federal, State, Local not specified - PPO | Source: Ambulatory Visit | Attending: Interventional Cardiology | Admitting: Interventional Cardiology

## 2015-04-30 ENCOUNTER — Other Ambulatory Visit (INDEPENDENT_AMBULATORY_CARE_PROVIDER_SITE_OTHER): Payer: Federal, State, Local not specified - PPO | Admitting: *Deleted

## 2015-04-30 DIAGNOSIS — Z01812 Encounter for preprocedural laboratory examination: Secondary | ICD-10-CM

## 2015-04-30 DIAGNOSIS — Z48812 Encounter for surgical aftercare following surgery on the circulatory system: Secondary | ICD-10-CM | POA: Diagnosis not present

## 2015-04-30 DIAGNOSIS — Z006 Encounter for examination for normal comparison and control in clinical research program: Secondary | ICD-10-CM

## 2015-04-30 LAB — CBC WITH DIFFERENTIAL/PLATELET
Basophils Absolute: 0.1 10*3/uL (ref 0.0–0.1)
Basophils Relative: 1 % (ref 0–1)
EOS ABS: 0.3 10*3/uL (ref 0.0–0.7)
EOS PCT: 5 % (ref 0–5)
HEMATOCRIT: 41.7 % (ref 39.0–52.0)
Hemoglobin: 13.5 g/dL (ref 13.0–17.0)
LYMPHS ABS: 2.1 10*3/uL (ref 0.7–4.0)
LYMPHS PCT: 35 % (ref 12–46)
MCH: 27.9 pg (ref 26.0–34.0)
MCHC: 32.4 g/dL (ref 30.0–36.0)
MCV: 86.2 fL (ref 78.0–100.0)
MONOS PCT: 12 % (ref 3–12)
MPV: 11 fL (ref 8.6–12.4)
Monocytes Absolute: 0.7 10*3/uL (ref 0.1–1.0)
NEUTROS PCT: 47 % (ref 43–77)
Neutro Abs: 2.8 10*3/uL (ref 1.7–7.7)
PLATELETS: 251 10*3/uL (ref 150–400)
RBC: 4.84 MIL/uL (ref 4.22–5.81)
RDW: 14.5 % (ref 11.5–15.5)
WBC: 6 10*3/uL (ref 4.0–10.5)

## 2015-04-30 LAB — BASIC METABOLIC PANEL
BUN: 9 mg/dL (ref 7–25)
CO2: 26 mmol/L (ref 20–31)
Calcium: 8.8 mg/dL (ref 8.6–10.3)
Chloride: 102 mmol/L (ref 98–110)
Creat: 0.81 mg/dL (ref 0.70–1.25)
GLUCOSE: 196 mg/dL — AB (ref 65–99)
POTASSIUM: 3.7 mmol/L (ref 3.5–5.3)
SODIUM: 138 mmol/L (ref 135–146)

## 2015-04-30 NOTE — Addendum Note (Signed)
Addended by: Eulis Foster on: 04/30/2015 12:00 PM   Modules accepted: Orders

## 2015-04-30 NOTE — Progress Notes (Signed)
TWILIGHT Telephone call to patient about bringing study drug with in tomorrow prior to cath. Messaged Dr. Tamala Julian about DES/BMS and angioplasty and anticipated medical regime post PCI. (If patient receives BMS or angioplasty and ASA not needed he can remain in study and receive the study provided Brilinta ASA/Placebo.)

## 2015-05-01 ENCOUNTER — Encounter (HOSPITAL_COMMUNITY): Payer: Self-pay | Admitting: General Practice

## 2015-05-01 ENCOUNTER — Ambulatory Visit (HOSPITAL_COMMUNITY)
Admission: RE | Admit: 2015-05-01 | Discharge: 2015-05-02 | Disposition: A | Discharge status: Home / Self Care | Payer: Federal, State, Local not specified - PPO | Source: Ambulatory Visit | Attending: Interventional Cardiology | Admitting: Interventional Cardiology

## 2015-05-01 ENCOUNTER — Encounter (HOSPITAL_COMMUNITY)
Admission: RE | Disposition: A | Discharge status: Home / Self Care | Payer: Self-pay | Source: Ambulatory Visit | Attending: Interventional Cardiology

## 2015-05-01 DIAGNOSIS — D649 Anemia, unspecified: Secondary | ICD-10-CM | POA: Diagnosis not present

## 2015-05-01 DIAGNOSIS — I252 Old myocardial infarction: Secondary | ICD-10-CM | POA: Diagnosis not present

## 2015-05-01 DIAGNOSIS — Z955 Presence of coronary angioplasty implant and graft: Secondary | ICD-10-CM | POA: Insufficient documentation

## 2015-05-01 DIAGNOSIS — K219 Gastro-esophageal reflux disease without esophagitis: Secondary | ICD-10-CM | POA: Diagnosis not present

## 2015-05-01 DIAGNOSIS — R001 Bradycardia, unspecified: Secondary | ICD-10-CM | POA: Insufficient documentation

## 2015-05-01 DIAGNOSIS — F329 Major depressive disorder, single episode, unspecified: Secondary | ICD-10-CM | POA: Diagnosis not present

## 2015-05-01 DIAGNOSIS — E785 Hyperlipidemia, unspecified: Secondary | ICD-10-CM | POA: Diagnosis not present

## 2015-05-01 DIAGNOSIS — E1159 Type 2 diabetes mellitus with other circulatory complications: Secondary | ICD-10-CM | POA: Diagnosis not present

## 2015-05-01 DIAGNOSIS — Z87891 Personal history of nicotine dependence: Secondary | ICD-10-CM | POA: Diagnosis not present

## 2015-05-01 DIAGNOSIS — I25119 Atherosclerotic heart disease of native coronary artery with unspecified angina pectoris: Secondary | ICD-10-CM

## 2015-05-01 DIAGNOSIS — Z794 Long term (current) use of insulin: Secondary | ICD-10-CM | POA: Insufficient documentation

## 2015-05-01 DIAGNOSIS — Z79899 Other long term (current) drug therapy: Secondary | ICD-10-CM | POA: Diagnosis not present

## 2015-05-01 DIAGNOSIS — I10 Essential (primary) hypertension: Secondary | ICD-10-CM | POA: Diagnosis not present

## 2015-05-01 DIAGNOSIS — I209 Angina pectoris, unspecified: Secondary | ICD-10-CM

## 2015-05-01 DIAGNOSIS — G4733 Obstructive sleep apnea (adult) (pediatric): Secondary | ICD-10-CM | POA: Diagnosis not present

## 2015-05-01 DIAGNOSIS — Z8249 Family history of ischemic heart disease and other diseases of the circulatory system: Secondary | ICD-10-CM | POA: Diagnosis not present

## 2015-05-01 DIAGNOSIS — I214 Non-ST elevation (NSTEMI) myocardial infarction: Secondary | ICD-10-CM | POA: Diagnosis present

## 2015-05-01 DIAGNOSIS — I2510 Atherosclerotic heart disease of native coronary artery without angina pectoris: Secondary | ICD-10-CM | POA: Diagnosis present

## 2015-05-01 HISTORY — DX: Low back pain, unspecified: M54.50

## 2015-05-01 HISTORY — PX: CARDIAC CATHETERIZATION: SHX172

## 2015-05-01 HISTORY — DX: Angina pectoris, unspecified: I20.9

## 2015-05-01 HISTORY — DX: Atherosclerotic heart disease of native coronary artery without angina pectoris: I25.10

## 2015-05-01 HISTORY — DX: Other chronic pain: G89.29

## 2015-05-01 HISTORY — DX: Low back pain: M54.5

## 2015-05-01 HISTORY — DX: Unspecified osteoarthritis, unspecified site: M19.90

## 2015-05-01 HISTORY — DX: Pneumonia, unspecified organism: J18.9

## 2015-05-01 LAB — GLUCOSE, CAPILLARY
GLUCOSE-CAPILLARY: 145 mg/dL — AB (ref 65–99)
GLUCOSE-CAPILLARY: 141 mg/dL — AB (ref 65–99)
Glucose-Capillary: 125 mg/dL — ABNORMAL HIGH (ref 65–99)
Glucose-Capillary: 146 mg/dL — ABNORMAL HIGH (ref 65–99)

## 2015-05-01 LAB — POCT ACTIVATED CLOTTING TIME: ACTIVATED CLOTTING TIME: 384 s

## 2015-05-01 LAB — PROTIME-INR
INR: 0.98 (ref <1.50)
Prothrombin Time: 13.1 seconds (ref 11.6–15.2)

## 2015-05-01 SURGERY — LEFT HEART CATH AND CORONARY ANGIOGRAPHY

## 2015-05-01 MED ORDER — SODIUM CHLORIDE 0.9 % WEIGHT BASED INFUSION: 1.0000 mL/kg/h | INTRAVENOUS | Status: DC

## 2015-05-01 MED ORDER — SODIUM CHLORIDE 0.9 % IV SOLN: 250.0000 mL | INTRAVENOUS | Status: DC | PRN

## 2015-05-01 MED ORDER — LIDOCAINE HCL (PF) 1 % IJ SOLN
INTRAMUSCULAR | Status: AC
  Filled 2015-05-01: qty 30

## 2015-05-01 MED ORDER — INSULIN GLARGINE 100 UNIT/ML ~~LOC~~ SOLN
20.0000 [IU] | Freq: Every day | SUBCUTANEOUS | Status: DC
  Administered 2015-05-01: 20 [IU] via SUBCUTANEOUS
  Filled 2015-05-01 (×2): qty 0.2

## 2015-05-01 MED ORDER — PANTOPRAZOLE SODIUM 40 MG PO TBEC
40.0000 mg | DELAYED_RELEASE_TABLET | Freq: Every day | ORAL | Status: DC
  Administered 2015-05-01: 40 mg via ORAL
  Filled 2015-05-01: qty 1

## 2015-05-01 MED ORDER — HYDROCHLOROTHIAZIDE 12.5 MG PO CAPS: 12.5000 mg | ORAL_CAPSULE | Freq: Every day | ORAL | Status: DC

## 2015-05-01 MED ORDER — SODIUM CHLORIDE 0.9% FLUSH: 3.0000 mL | Freq: Two times a day (BID) | INTRAVENOUS | Status: DC

## 2015-05-01 MED ORDER — HEPARIN (PORCINE) IN NACL 2-0.9 UNIT/ML-% IJ SOLN
INTRAMUSCULAR | Status: AC
  Filled 2015-05-01: qty 1000

## 2015-05-01 MED ORDER — SODIUM CHLORIDE 0.9 % WEIGHT BASED INFUSION
1.0000 mL/kg/h | INTRAVENOUS | Status: DC
  Administered 2015-05-01: 1 mL/kg/h via INTRAVENOUS

## 2015-05-01 MED ORDER — AMLODIPINE BESYLATE 10 MG PO TABS: 10.0000 mg | ORAL_TABLET | Freq: Every day | ORAL | Status: DC

## 2015-05-01 MED ORDER — AMLODIPINE BESYLATE 10 MG PO TABS
10.0000 mg | ORAL_TABLET | Freq: Every day | ORAL | Status: DC
Start: 2015-05-02 — End: 2015-05-01

## 2015-05-01 MED ORDER — ONDANSETRON HCL 4 MG/2ML IJ SOLN: 4.0000 mg | Freq: Four times a day (QID) | INTRAMUSCULAR | Status: DC | PRN

## 2015-05-01 MED ORDER — GLIPIZIDE ER 5 MG PO TB24
5.0000 mg | ORAL_TABLET | Freq: Every day | ORAL | Status: DC
  Administered 2015-05-01 – 2015-05-02 (×2): 5 mg via ORAL
  Filled 2015-05-01 (×2): qty 1

## 2015-05-01 MED ORDER — PANTOPRAZOLE SODIUM 40 MG PO TBEC: 40.0000 mg | DELAYED_RELEASE_TABLET | Freq: Every day | ORAL | Status: DC

## 2015-05-01 MED ORDER — ACETAMINOPHEN 325 MG PO TABS: 650.0000 mg | ORAL_TABLET | ORAL | Status: DC | PRN

## 2015-05-01 MED ORDER — BIVALIRUDIN 250 MG IV SOLR
INTRAVENOUS | Status: AC
  Filled 2015-05-01: qty 250

## 2015-05-01 MED ORDER — SODIUM CHLORIDE 0.9 % WEIGHT BASED INFUSION
3.0000 mL/kg/h | INTRAVENOUS | Status: DC
  Administered 2015-05-01: 3 mL/kg/h via INTRAVENOUS

## 2015-05-01 MED ORDER — MIDAZOLAM HCL 2 MG/2ML IJ SOLN
INTRAMUSCULAR | Status: DC | PRN
  Administered 2015-05-01 (×3): 1 mg via INTRAVENOUS

## 2015-05-01 MED ORDER — LOSARTAN POTASSIUM 50 MG PO TABS: 100.0000 mg | ORAL_TABLET | Freq: Every day | ORAL | Status: DC

## 2015-05-01 MED ORDER — OXYCODONE-ACETAMINOPHEN 5-325 MG PO TABS: 1.0000 | ORAL_TABLET | ORAL | Status: DC | PRN

## 2015-05-01 MED ORDER — MIDAZOLAM HCL 2 MG/2ML IJ SOLN
INTRAMUSCULAR | Status: AC
  Filled 2015-05-01: qty 2

## 2015-05-01 MED ORDER — ASPIRIN 81 MG PO CHEW
81.0000 mg | CHEWABLE_TABLET | ORAL | Status: AC
  Administered 2015-05-01: 81 mg via ORAL

## 2015-05-01 MED ORDER — TICAGRELOR 90 MG PO TABS
ORAL_TABLET | ORAL | Status: DC | PRN
  Administered 2015-05-01: 90 mg via ORAL

## 2015-05-01 MED ORDER — LOSARTAN POTASSIUM 50 MG PO TABS
100.0000 mg | ORAL_TABLET | Freq: Every day | ORAL | Status: DC
  Administered 2015-05-01: 21:00:00 100 mg via ORAL
  Filled 2015-05-01: qty 2

## 2015-05-01 MED ORDER — FENTANYL CITRATE (PF) 100 MCG/2ML IJ SOLN
INTRAMUSCULAR | Status: DC | PRN
  Administered 2015-05-01 (×2): 50 ug via INTRAVENOUS

## 2015-05-01 MED ORDER — METOPROLOL TARTRATE 25 MG PO TABS
25.0000 mg | ORAL_TABLET | Freq: Two times a day (BID) | ORAL | Status: DC
  Administered 2015-05-01 – 2015-05-02 (×3): 25 mg via ORAL
  Filled 2015-05-01 (×3): qty 1

## 2015-05-01 MED ORDER — TICAGRELOR 90 MG PO TABS
ORAL_TABLET | ORAL | Status: AC
  Filled 2015-05-01: qty 1

## 2015-05-01 MED ORDER — GLIPIZIDE ER 5 MG PO TB24: 5.0000 mg | ORAL_TABLET | Freq: Every day | ORAL | Status: DC

## 2015-05-01 MED ORDER — NITROGLYCERIN 1 MG/10 ML FOR IR/CATH LAB
INTRA_ARTERIAL | Status: DC | PRN
  Administered 2015-05-01: 200 ug via INTRACORONARY
  Administered 2015-05-01: 600 ug

## 2015-05-01 MED ORDER — SODIUM CHLORIDE 0.9% FLUSH: 3.0000 mL | INTRAVENOUS | Status: DC | PRN

## 2015-05-01 MED ORDER — TRAZODONE HCL 50 MG PO TABS: 50.0000 mg | ORAL_TABLET | Freq: Every evening | ORAL | Status: DC | PRN

## 2015-05-01 MED ORDER — AMLODIPINE BESYLATE 10 MG PO TABS
10.0000 mg | ORAL_TABLET | Freq: Every day | ORAL | Status: DC
  Administered 2015-05-01: 21:00:00 10 mg via ORAL
  Filled 2015-05-01: qty 1

## 2015-05-01 MED ORDER — TIZANIDINE HCL 4 MG PO TABS
4.0000 mg | ORAL_TABLET | Freq: Three times a day (TID) | ORAL | Status: DC | PRN
  Filled 2015-05-01: qty 1

## 2015-05-01 MED ORDER — INSULIN GLARGINE 100 UNIT/ML SOLOSTAR PEN: 20.0000 [IU] | PEN_INJECTOR | Freq: Every day | SUBCUTANEOUS | Status: DC

## 2015-05-01 MED ORDER — NITROGLYCERIN 0.4 MG SL SUBL: 0.4000 mg | SUBLINGUAL_TABLET | SUBLINGUAL | Status: DC | PRN

## 2015-05-01 MED ORDER — PANTOPRAZOLE SODIUM 40 MG PO TBEC
40.0000 mg | DELAYED_RELEASE_TABLET | Freq: Every day | ORAL | Status: DC
Start: 2015-05-01 — End: 2015-05-01

## 2015-05-01 MED ORDER — IOHEXOL 350 MG/ML SOLN
INTRAVENOUS | Status: DC | PRN
  Administered 2015-05-01: 240 mL via INTRA_ARTERIAL

## 2015-05-01 MED ORDER — LIDOCAINE HCL (PF) 1 % IJ SOLN
INTRAMUSCULAR | Status: DC | PRN
  Administered 2015-05-01: 11:00:00

## 2015-05-01 MED ORDER — INSULIN ASPART 100 UNIT/ML ~~LOC~~ SOLN
0.0000 [IU] | Freq: Three times a day (TID) | SUBCUTANEOUS | Status: DC
  Administered 2015-05-01 – 2015-05-02 (×3): 2 [IU] via SUBCUTANEOUS

## 2015-05-01 MED ORDER — SODIUM CHLORIDE 0.9 % IV SOLN
250.0000 mg | INTRAVENOUS | Status: DC | PRN
  Administered 2015-05-01 (×2): 1.75 mg/kg/h via INTRAVENOUS

## 2015-05-01 MED ORDER — BIVALIRUDIN BOLUS VIA INFUSION - CUPID
INTRAVENOUS | Status: DC | PRN
  Administered 2015-05-01: 85.05 mg via INTRAVENOUS

## 2015-05-01 MED ORDER — SODIUM CHLORIDE 0.9 % IV SOLN
250.0000 mL | INTRAVENOUS | Status: DC | PRN
Start: 2015-05-01 — End: 2015-05-02

## 2015-05-01 MED ORDER — ASPIRIN 81 MG PO CHEW
CHEWABLE_TABLET | ORAL | Status: AC
  Administered 2015-05-01: 81 mg via ORAL
  Filled 2015-05-01: qty 1

## 2015-05-01 MED ORDER — FENTANYL CITRATE (PF) 100 MCG/2ML IJ SOLN
INTRAMUSCULAR | Status: AC
  Filled 2015-05-01: qty 2

## 2015-05-01 MED ORDER — NITROGLYCERIN 1 MG/10 ML FOR IR/CATH LAB
INTRA_ARTERIAL | Status: AC
  Filled 2015-05-01: qty 10

## 2015-05-01 MED ORDER — HYDROCHLOROTHIAZIDE 12.5 MG PO CAPS
12.5000 mg | ORAL_CAPSULE | Freq: Every day | ORAL | Status: DC
  Administered 2015-05-01: 12.5 mg via ORAL
  Filled 2015-05-01: qty 1

## 2015-05-01 MED ORDER — LOSARTAN POTASSIUM-HCTZ 100-12.5 MG PO TABS: 1.0000 | ORAL_TABLET | Freq: Every day | ORAL | Status: DC

## 2015-05-01 SURGICAL SUPPLY — 19 items
BALLN MAVERICK OTW 2.25X15 (BALLOONS) ×2 IMPLANT
CATH INFINITI JR4 5F (CATHETERS) ×2 IMPLANT
CATH SITESEER 5F MULTI A 2 (CATHETERS) ×2 IMPLANT
CATH SUPERCROSS ANGLED 90 DEG (MICROCATHETER) ×2 IMPLANT
DEVICE WIRE ANGIOSEAL 6FR (Vascular Products) ×2 IMPLANT
GUIDE CATH RUNWAY 6FR CLS3.5 (CATHETERS) ×2 IMPLANT
GUIDE CATH RUNWAY 6FR CLS4 (CATHETERS) ×2 IMPLANT
KIT ENCORE 26 ADVANTAGE (KITS) ×2 IMPLANT
KIT HEART LEFT (KITS) ×2 IMPLANT
PACK CARDIAC CATHETERIZATION (CUSTOM PROCEDURE TRAY) ×2 IMPLANT
SHEATH PINNACLE 6F 10CM (SHEATH) ×2 IMPLANT
TRANSDUCER W/STOPCOCK (MISCELLANEOUS) ×2 IMPLANT
TUBING CIL FLEX 10 FLL-RA (TUBING) ×2 IMPLANT
WIRE ASAHI FIELDER XT 190CM (WIRE) ×2 IMPLANT
WIRE ASAHI PROWATER 180CM (WIRE) ×2 IMPLANT
WIRE ASAHI PROWATER 300CM (WIRE) ×2 IMPLANT
WIRE COUGAR XT STRL 190CM (WIRE) ×2 IMPLANT
WIRE EMERALD 3MM-J .035X150CM (WIRE) ×2 IMPLANT
WIRE HI TORQ WHISPER MS 190CM (WIRE) ×4 IMPLANT

## 2015-05-01 NOTE — Interval H&P Note (Signed)
Cath Lab Visit (complete for each Cath Lab visit)  Clinical Evaluation Leading to the Procedure:   ACS: No.  Non-ACS:    Anginal Classification: CCS III  Anti-ischemic medical therapy: Minimal Therapy (1 class of medications)  Non-Invasive Test Results: No non-invasive testing performed  Prior CABG: No previous CABG      History and Physical Interval Note:  05/01/2015 9:10 AM  [name]  has presented today for surgery, with the diagnosis of cp  The various methods of treatment have been discussed with the patient and family. After consideration of risks, benefits and other options for treatment, the patient has consented to  Procedure(s): Left Heart Cath and Coronary Angiography (N/A) as a surgical intervention .  The patient's history has been reviewed, patient examined, no change in status, stable for surgery.  I have reviewed the patient's chart and labs.  Questions were answered to the patient's satisfaction.     [name]

## 2015-05-01 NOTE — H&P (View-Only) (Signed)
Cardiology Office Note   Date:  04/15/2015   ID:  [name], [name] January 01, 1951, MRN [id number]  PCP:  [name], MD  Cardiologist:  [name], MD   Chief Complaint  Patient presents with  . Gastroesophageal Reflux      History of Present Illness: [name] is a 65 y.o. male who presents for CAD, circumflex DES, obtuse marginal not stented due to inability of stent to track (likely due to wire tangle).  Increasingly sedentary. He feels depressed. He enjoys cardiac rehabilitation. He has not limited and rehabilitation by chest discomfort other complaints. For the past 2-4 days he has been troubled by "indigestion". It causes a lot of belching. He can last for hours. It is not made worse by physical activity.    Past Medical History  Diagnosis Date  . Anemia   . Type II or unspecified type diabetes mellitus without mention of complication, not stated as uncontrolled   . GERD (gastroesophageal reflux disease)   . Hyperlipidemia   . HTN (hypertension)   . Osteoarthritis   . Depression   . Adenomatous colon polyp 2011  . OSA on CPAP   . Sleep apnea     CPAP  . Chest pain     pleuritic  . Refusal of blood transfusions as patient is Jehovah's Witness     Past Surgical History  Procedure Laterality Date  . Pilonidal cyst excision    . Shoulder surgery      Right  . Cardiac catheterization  2004    patent coronary arteries  . Colonoscopy    . Polypectomy    . Cardiac catheterization N/A 11/06/2014    Procedure: Left Heart Cath and Coronary Angiography;  Surgeon: Belva Crome, MD;  Location: [facility];  Service: Cardiovascular;  Laterality: N/A;     Current Outpatient Prescriptions  Medication Sig Dispense Refill  . AMBULATORY NON FORMULARY MEDICATION Take 90 mg by mouth 2 (two) times daily. Medication Name: Brilinta 90 mg BID (TWILIGHT Study provided )    . AMBULATORY NON FORMULARY MEDICATION Take 81 mg by mouth daily. Medication  Name: ASA 81 Mg daily or PLACEBO (TWILIGHT RESEARCH No open label asa, study provided)    . amLODipine (NORVASC) 10 MG tablet TAKE 1 TABLET (10 MG TOTAL) BY MOUTH DAILY. 180 tablet 1  . Blood Glucose Monitoring Suppl (RELION CONFIRM GLUCOSE MONITOR) W/DEVICE KIT 1 Device by Does not apply route once. 1 kit 0  . esomeprazole (NEXIUM) 40 MG capsule Take 1 capsule (40 mg total) by mouth daily at 12 noon. 30 capsule 3  . glipiZIDE (GLIPIZIDE XL) 5 MG 24 hr tablet Take 1 tablet (5 mg total) by mouth daily with breakfast. 90 tablet 2  . glucose blood (RELION ULTIMA TEST) test strip 1 each by Other route 2 (two) times daily. And lancets 2/day 100 each 12  . Insulin Pen Needle (NOVOFINE PLUS) 32G X 4 MM MISC Use 1x a day 100 each 11  . LANTUS SOLOSTAR 100 UNIT/ML Solostar Pen INJECT 20 UNITS INTO THE SKIN DAILY AT 10 PM. 15 pen 2  . latanoprost (XALATAN) 0.005 % ophthalmic solution Place 1 drop into both eyes 2 (two) times daily.  5  . losartan-hydrochlorothiazide (HYZAAR) 100-12.5 MG tablet Take 1 tablet by mouth daily. 30 tablet 11  . metFORMIN (GLUCOPHAGE) 1000 MG tablet Take 1 tablet (1,000 mg total) by mouth 2 (two) times daily with a meal. 180 tablet 2  .  metoprolol tartrate (LOPRESSOR) 25 MG tablet Take 1 tablet (25 mg total) by mouth 2 (two) times daily.    . nitroGLYCERIN (NITROSTAT) 0.4 MG SL tablet Place 0.4 mg under the tongue every 5 (five) minutes x 3 doses as needed. (CHEST PAIN)    . tiZANidine (ZANAFLEX) 4 MG tablet Take 4 mg by mouth every 8 (eight) hours as needed. (MUSCLE SPASMS)  2  . traZODone (DESYREL) 50 MG tablet Take 1 tablet (50 mg total) by mouth at bedtime as needed for sleep. 30 tablet 3   No current facility-administered medications for this visit.    Allergies:   Statins    Social History:  The patient  reports that he quit smoking about 32 years ago. His smoking use included Cigarettes. He quit after 10 years of use. He has never used smokeless tobacco. He reports that  he drinks alcohol. He reports that he does not use illicit drugs.   Family History:  The patient's family history includes Alcohol abuse in his other; Colon cancer in his paternal uncle; Coronary artery disease in his mother and other; Diabetes in his maternal uncle, mother, paternal uncle, and sister; Heart disease in his mother; Stroke in his mother. There is no history of Colon polyps or Esophageal cancer.    ROS:  Please see the history of present illness.   Otherwise, review of systems are positive for depression, leg swelling, facial swelling, fatigue, orthopnea, anxiety, back discomfort, muscle pain.   All other systems are reviewed and negative.    PHYSICAL EXAM: VS:  BP 154/70 mmHg  Pulse 75  Ht _0  (1.778 m)  Wt 256 lb 12.8 oz (116.484 kg)  BMI 36.85 kg/m2 , BMI Body mass index is 36.85 kg/(m^2). GEN: Well nourished, well developed, in no acute distress HEENT: normal Neck: no JVD, carotid bruits, or masses Cardiac: RRR.  There is no murmur, rub, or gallop. There is no edema. Respiratory:  clear to auscultation bilaterally, normal work of breathing. GI: soft, nontender, nondistended, + BS MS: no deformity or atrophy Skin: warm and dry, no rash Neuro:  Strength and sensation are intact Psych: euthymic mood, full affect   EKG:  EKG is not ordered today.    Recent Labs: 05/09/2014: ALT 17 12/20/2014: BUN 10; Creatinine, Ser 0.95; Hemoglobin 13.4; Platelets 239.0; Potassium 3.9; Pro B Natriuretic peptide (BNP) 25.0; Sodium 138    Lipid Panel    Component Value Date/Time   CHOL 201* 05/09/2014 0909   TRIG 69.0 05/09/2014 0909   HDL 45.80 05/09/2014 0909   CHOLHDL 4 05/09/2014 0909   VLDL 13.8 05/09/2014 0909   LDLCALC 141* 05/09/2014 0909   LDLDIRECT 168.0 12/18/2012 1340      Wt Readings from Last 3 Encounters:  04/15/15 256 lb 12.8 oz (116.484 kg)  03/20/15 253 lb (114.76 kg)  02/12/15 254 lb 6.4 oz (115.395 kg)      Other studies Reviewed: Additional  studies/ records that were reviewed today include: None. The findings include reviewed cardiac rehabilitation reports which are benign.    ASSESSMENT AND PLAN:  1. Coronary artery disease involving native coronary artery of native heart without angina pectoris Residual CAD. No exertional discomfort.  2. Essential hypertension Borderline control   3. Hyperlipidemia with target LDL less than 100 He stopped statin therapy cause of musculoskeletal syndrome. He has decided against PCS K-9  4. Indigestion Rule out anginal equivalent   Current medicines are reviewed at length with the patient today.  The patient  has the following concerns regarding medicines: Unable to use statins and unwilling to institute PCS K-9 therapy due to expense..  The following changes/actions have been instituted:    We will nitroglycerin for recurring episodes of indigestion. If nitroglycerin improves the discomfort, we will consider repeat cardiac cath. He has residual CAD.  Labs/ tests ordered today include:  No orders of the defined types were placed in this encounter.     Disposition:   FU with HS in 6 months  Signed, [name], MD  04/15/2015 10:16 AM    [address], [place], [place]  [address] Phone: [phone]; Fax: [phone]

## 2015-05-02 ENCOUNTER — Telehealth: Payer: Self-pay | Admitting: *Deleted

## 2015-05-02 ENCOUNTER — Telehealth: Payer: Self-pay | Admitting: Physician Assistant

## 2015-05-02 ENCOUNTER — Encounter (HOSPITAL_COMMUNITY): Payer: Federal, State, Local not specified - PPO

## 2015-05-02 ENCOUNTER — Encounter (HOSPITAL_COMMUNITY): Payer: Self-pay | Admitting: Physician Assistant

## 2015-05-02 DIAGNOSIS — I10 Essential (primary) hypertension: Secondary | ICD-10-CM

## 2015-05-02 DIAGNOSIS — I2510 Atherosclerotic heart disease of native coronary artery without angina pectoris: Secondary | ICD-10-CM

## 2015-05-02 DIAGNOSIS — I209 Angina pectoris, unspecified: Secondary | ICD-10-CM | POA: Diagnosis not present

## 2015-05-02 DIAGNOSIS — I214 Non-ST elevation (NSTEMI) myocardial infarction: Secondary | ICD-10-CM

## 2015-05-02 DIAGNOSIS — R001 Bradycardia, unspecified: Secondary | ICD-10-CM

## 2015-05-02 DIAGNOSIS — I25119 Atherosclerotic heart disease of native coronary artery with unspecified angina pectoris: Secondary | ICD-10-CM | POA: Diagnosis not present

## 2015-05-02 LAB — GLUCOSE, CAPILLARY: GLUCOSE-CAPILLARY: 124 mg/dL — AB (ref 65–99)

## 2015-05-02 MED ORDER — PRAVASTATIN SODIUM 20 MG PO TABS: 20.0000 mg | ORAL_TABLET | Freq: Every day | ORAL | Status: DC

## 2015-05-02 MED ORDER — ISOSORBIDE MONONITRATE ER 30 MG PO TB24: 30.0000 mg | ORAL_TABLET | Freq: Every day | ORAL | Status: DC

## 2015-05-02 MED ORDER — NITROGLYCERIN 0.4 MG SL SUBL: 0.4000 mg | SUBLINGUAL_TABLET | SUBLINGUAL | Status: DC | PRN

## 2015-05-02 MED ORDER — TICAGRELOR 90 MG PO TABS: 90.0000 mg | ORAL_TABLET | Freq: Two times a day (BID) | ORAL | Status: DC

## 2015-05-02 NOTE — Discharge Instructions (Signed)

## 2015-05-02 NOTE — Discharge Summary (Signed)
CARDIOLOGY DISCHARGE SUMMARY   Patient ID: STEPFON Brock MRN: [id number] DOB/AGE: Sep 17, 1950 65 y.o.  Admit date: 05/01/2015 Discharge date: 05/02/2015  PCP: [name], MD Primary Cardiologist: Dr Tamala Julian  Primary Discharge Diagnosis:  Angina pectoris Secondary Discharge Diagnosis:    Type 2 diabetes mellitus with circulatory disorder [facility])   Essential hypertension, benign   Symptomatic bradycardia, mildly symptomatic with lack of energy and fatigue   NSTEMI (non-ST elevated myocardial infarction) [facility]) - 11/2014 w/ stent CFX & PTCA OM   CAD in native artery  Procedures: Cardiac catheterization, coronary arteriogram, left ventriculogram, unsuccessful PCI  Hospital Course: [name] is a 65 y.o. male with a history of DES CFX, PTCA OM (not stented due to technical probs), DM, HTN, HL, GERD, anemia. He has been in the TWILIGHT study.  Pt having sx initially thought to be 2nd GERD, but were relieved by SL NTG. Cath set up for 01/26.   Cath results below. Unable to access the 95% OM lesion (previously treated w/ PTCA). Medical therapy is only option.  Pt tolerated the procedure well.  He has a history of hyperlipidemia but has had problems with statins in the past. He agrees to try a low dose of his statin twice a week and increase as tolerated. We will try pravastatin 20 mg.   On 06/02/2015, [name] was seen by Dr. Meda Coffee and all data were reviewed. He is on his home blood pressure medications, and his SBP is >130, so believe he will tolerate Imdur 30 added to his medication regimen. His heart rate is in the 50s at times, so no increase in his beta blocker. He is to hold his metformin for 48 hours. Continue Norvasc and losartan/HCTZ. He is to be seen by cardiac rehabilitation and will ambulate with them. His cath site is stable.  No further inpatient workup is indicated and he is considered stable for discharge, to follow up in the office.   BP 130/71 mmHg  Pulse  59  Temp(Src) 98 F (36.7 C) (Oral)  Resp 19  Ht 5' 10"  (1.778 m)  Wt 257 lb 15 oz (117 kg)  BMI 37.01 kg/m2  SpO2 96% General: Well developed, well nourished, male in no acute distress Head: Eyes PERRLA, No xanthomas.   Normocephalic and atraumatic  Lungs: Clear bilaterally to auscultation. Heart: HRRR S1 S2, without MRG.  Pulses are 2+ & equal. No carotid bruit. No JVD. Abdomen: Bowel sounds are present, abdomen soft and non-tender without masses or  hernias noted. Msk: Normal strength and tone for age. Extremities: No clubbing, cyanosis or edema. R groin cath site without ecchymosis, hematoma or bruit.   Skin:  No rashes or lesions noted. Neuro: Alert and oriented X 3. Psych:  Good affect, responds appropriately   Labs:   Lab Results  Component Value Date   WBC 6.0 04/30/2015   HGB 13.5 04/30/2015   HCT 41.7 04/30/2015   MCV 86.2 04/30/2015   PLT 251 04/30/2015     Recent Labs Lab 04/30/15 1200  NA 138  K 3.7  CL 102  CO2 26  BUN 9  CREATININE 0.81  CALCIUM 8.8  GLUCOSE 196*   Lipid Panel     Component Value Date/Time   CHOL 201* 05/09/2014 0909   TRIG 69.0 05/09/2014 0909   HDL 45.80 05/09/2014 0909   CHOLHDL 4 05/09/2014 0909   VLDL 13.8 05/09/2014 0909   LDLCALC 141* 05/09/2014 0909   LDLDIRECT 168.0 12/18/2012  1340    Recent Labs  04/30/15 1200  INR 0.98   Cardiac Cath: 05/01/2015 1. 1st Diag lesion, 70% stenosed. 2. Ost 1st Diag to 1st Diag lesion, 70% stenosed. 3. 2nd Diag lesion, 75% stenosed. 4. Mid LAD to Dist LAD lesion, 60% stenosed. 5. Prox RCA to Dist RCA lesion, 25% stenosed. 6. Dist Cx lesion, 30% stenosed. 7. Ost 1st Diag lesion, 90% stenosed. 8. 1st Mrg-1 lesion, 95% stenosed. 9. 1st Mrg-2 lesion, 95% stenosed. Post intervention, there is a 95% residual stenosis. The lesion was previously treated with angioplasty.  Continued widely patent mid circumflex stent.  95% stenosis in the mid body of the first obtuse marginal,  representing restenosis after angioplasty in August reduced the lesion to 50%.  90% ostial first diagonal  Otherwise no change in anatomy compared to the prior post PCI angiogram  Normal LV function  Failed first obtuse marginal PTCA due to inability to cross the stenosis. Angulation in the vessel prevented torque ability and we will unable to solve lack of intra-coronary support. Recommendation:  Continue medical therapy  Monitor overnight and discharge in a.m.  No change in medical regimen  Resume phase II cardiac rehabilitation           Post-Intervention Diagram              EKG: 05/01/2015 Sinus rhythm, first-degree AV block Minor ST changes in leads 3 and aVF are similar to August 2016 ECG   FOLLOW UP PLANS AND APPOINTMENTS Allergies  Allergen Reactions  . Statins Other (See Comments)    REACTION: joint pain Lipitor- headaches Has also tried Livalo, pravachol, zetia, crestor, welchol     Medication List    TAKE these medications        AMBULATORY NON FORMULARY MEDICATION  Take 90 mg by mouth 2 (two) times daily. Medication Name: Brilinta 90 mg BID (TWILIGHT Study provided )     AMBULATORY NON FORMULARY MEDICATION  Take 81 mg by mouth daily. Medication Name: ASA 81 Mg daily or PLACEBO (TWILIGHT RESEARCH No open label asa, study provided)     amLODipine 10 MG tablet  Commonly known as:  NORVASC  TAKE 1 TABLET (10 MG TOTAL) BY MOUTH DAILY.     esomeprazole 40 MG capsule  Commonly known as:  NEXIUM  Take 1 capsule (40 mg total) by mouth daily at 12 noon.     glipiZIDE 5 MG 24 hr tablet  Commonly known as:  GLIPIZIDE XL  Take 1 tablet (5 mg total) by mouth daily with breakfast.     glucose blood test strip  Commonly known as:  RELION ULTIMA TEST  1 each by Other route 2 (two) times daily. And lancets 2/day     Insulin Pen Needle 32G X 4 MM Misc  Commonly known as:  NOVOFINE PLUS  Use 1x a day     isosorbide mononitrate 30 MG 24 hr  tablet  Commonly known as:  IMDUR  Take 1 tablet (30 mg total) by mouth daily.     LANTUS SOLOSTAR 100 UNIT/ML Solostar Pen  Generic drug:  Insulin Glargine  INJECT 20 UNITS INTO THE SKIN DAILY AT 10 PM.     losartan-hydrochlorothiazide 100-12.5 MG tablet  Commonly known as:  HYZAAR  Take 1 tablet by mouth daily.     metFORMIN 1000 MG tablet  Commonly known as:  GLUCOPHAGE  Take 1 tablet (1,000 mg total) by mouth 2 (two) times daily with a meal.  metoprolol tartrate 25 MG tablet  Commonly known as:  LOPRESSOR  Take 1 tablet (25 mg total) by mouth 2 (two) times daily.     nitroGLYCERIN 0.4 MG SL tablet  Commonly known as:  NITROSTAT  Place 1 tablet (0.4 mg total) under the tongue every 5 (five) minutes x 3 doses as needed. (CHEST PAIN)     RELION CONFIRM GLUCOSE MONITOR w/Device Kit  1 Device by Does not apply route once.     tiZANidine 4 MG tablet  Commonly known as:  ZANAFLEX  Take 4 mg by mouth every 8 (eight) hours as needed. (MUSCLE SPASMS)     traZODone 50 MG tablet  Commonly known as:  DESYREL  Take 1 tablet (50 mg total) by mouth at bedtime as needed for sleep.        Discharge Instructions    Diet - low sodium heart healthy    Complete by:  As directed      Diet Carb Modified    Complete by:  As directed      Increase activity slowly    Complete by:  As directed           Follow-up Information    Follow up with [name], MD.   Specialty:  Cardiology   Why:  MD or PA/NP in 2 weeks, the office will call.   Contact information:   [address] [address] [phone]       BRING ALL MEDICATIONS WITH YOU TO FOLLOW UP APPOINTMENTS  Time spent with patient to include physician time: 41 min Signed: Rosaria Ferries, PA-C 05/02/2015, 8:37 AM Co-Sign MD  The patient was seen, examined and discussed with Rosaria Ferries, PA-C and I agree with the above.   Dorothy Spark, MD, [facility] 05/02/2015

## 2015-05-02 NOTE — Telephone Encounter (Signed)
Pt was on TCM list admitted for angina Pectoris. D/c 05/02/15 will be f/u w/cardiologist Dr. Tamala Julian...Johny Chess

## 2015-05-02 NOTE — Progress Notes (Signed)
Pt on research study ,study drug (brilinta and aspirin ) given  to pt  with   Instructions   per research team  ,  Am dose today  taken by  pt prior to discharge. Dr Meda Coffee and Rosaria Ferries PA aware

## 2015-05-02 NOTE — Telephone Encounter (Signed)
TCM per Suanne Marker- 05/19/15 @ 2pm w/ Kathlene November

## 2015-05-05 NOTE — Telephone Encounter (Signed)
Patient contacted regarding discharge from [facility] on 05/02/15.  Patient understands to follow up with provider Nell Range on 05/19/15 at [facility]  at [facility]. Patient understands discharge instructions? yes Patient understands medications and regiment? yes Patient understands to bring all medications to this visit? yes

## 2015-05-09 ENCOUNTER — Telehealth: Payer: Self-pay | Admitting: Internal Medicine

## 2015-05-09 ENCOUNTER — Telehealth: Payer: Self-pay | Admitting: Interventional Cardiology

## 2015-05-09 MED ORDER — INSULIN GLARGINE 100 UNIT/ML SOLOSTAR PEN: 20.0000 [IU] | PEN_INJECTOR | Freq: Every day | SUBCUTANEOUS | Status: DC

## 2015-05-09 NOTE — Telephone Encounter (Signed)
error 

## 2015-05-09 NOTE — Telephone Encounter (Signed)
[name], Let's send a Rx for Basaglar 20 units #5 pens with 2 refills - this is generic Lantus, should replace the name brand Lantus.

## 2015-05-09 NOTE — Telephone Encounter (Signed)
Patient states that he was sent information Basaglar without a co pay  Is this something that he can get a Rx on?  Please advise   Thank you

## 2015-05-12 ENCOUNTER — Other Ambulatory Visit: Payer: Self-pay | Admitting: *Deleted

## 2015-05-12 NOTE — Telephone Encounter (Signed)
Ins no longer covers Lantus. Switching to Basaglar per Dr Gherghe.  

## 2015-05-15 ENCOUNTER — Telehealth: Payer: Self-pay | Admitting: *Deleted

## 2015-05-15 ENCOUNTER — Telehealth: Payer: Self-pay | Admitting: Interventional Cardiology

## 2015-05-15 NOTE — Telephone Encounter (Signed)
Patient is requesting refill on Celebrex 200mg .

## 2015-05-15 NOTE — Telephone Encounter (Signed)
error 

## 2015-05-15 NOTE — Progress Notes (Signed)
Cardiology Office Note   Date:  05/19/2015   ID:  Ronald, Brock Jul 24, 1950, MRN [id number]  PCP:  [name], MD  Cardiologist:  Dr. Fransisca Kaufmann hospital follow up.   History of Present Illness: [name] is a 65 y.o. male with a history of NSTEMI: CAD s/p DES to LCx (11/2014), OM1 not stented due to inability of stent to track (likely due to wire tangle), HTN, HLD who presents to clinic for post hospital follow up.   He was seen in the office by Dr. Tamala Julian on 04/15/15 for follow up. He described episodes of belching. Dr. Tamala Julian prescribed him SL NTG to rule out being an anginal equivalent. The patient said his symptoms were relieved by the SL NTG and he was set up for outpatient cath on 01/26. Cath showed continued widely patent mid circumflex stent., 95% stenosis in the mid body of the first obtuse marginal, representing restenosis after angioplasty in August reduced the lesion to 50%, 90% ostial first diagonal and otherwise no change in anatomy compared to the prior post PCI angiogram. Dr Tamala Julian attempted and failed first obtuse marginal PTCA due to inability to cross the stenosis. Angulation in the vessel prevented torque ability and he wasl unable to solve lack of intra-coronary support. Medical therapy felt to be the only option. He was started on imdur and discharged the next day. Additionally, he has a history of hyperlipidemia but has had problems with statins in the past due to myalgias. He agreed to try a low dose of his statin twice a week and increase as tolerated. He was started on pravastatin 20 mg.   Today he presents to clinic for follow up. He has been doing okay. No CP but has mild SOB. He still has continued belching episodes. This is not really bothering him. He has not been taking the imdur everyday because he thought it was only for when he had chest pain. No LE edema, orthopnea or PND. No dizziness or syncope. No palpitations. He is very sedentary and  doesn't exercise. He thinks the BB is causing him depression, confusion and fatigue. He stopped taking it about a week and he felt a lot better in terms of energy and mood. When he started back the depressed symptoms recurred.     Past Medical History  Diagnosis Date  . Anemia   . GERD (gastroesophageal reflux disease)   . Hyperlipidemia   . HTN (hypertension)   . Depression   . Adenomatous colon polyp 2011  . Chest pain     pleuritic  . Refusal of blood transfusions as patient is Jehovah's Witness   . Coronary artery disease   . Pneumonia ~ 2014 X 1  . OSA on CPAP   . Type II or unspecified type diabetes mellitus without mention of complication, not stated as uncontrolled   . Osteoarthritis   . Arthritis     "back, knees" (05/01/2015)  . Chronic lower back pain   . Angina pectoris ([name]) 05/01/2015    med rx for 95% OM (u/a to access due to tortuosity), 95% D1 and other moderate CAD at cath    Past Surgical History  Procedure Laterality Date  . Pilonidal cyst excision    . Closed reduction shoulder dislocation Right ~ 1975    "& reattached muscle"  . Colonoscopy w/ biopsies and polypectomy  X 2  . Cardiac catheterization  2004    patent coronary arteries  .  Cardiac catheterization N/A 11/06/2014    Procedure: Left Heart Cath and Coronary Angiography;  Surgeon: Belva Crome, MD;  Location: [facility];  Service: Cardiovascular;  Laterality: N/A;  . Cardiac catheterization  05/01/2015    "tried to put stent in but couldn't"  . Esophagogastroduodenoscopy endoscopy    . Cardiac catheterization N/A 05/01/2015    Procedure: Left Heart Cath and Coronary Angiography;  Surgeon: Belva Crome, MD; LAD 60%, oD1 90%, pD1 70%, D2 70%, CFX patent stent, 30% distal to prev stent, pRCA 20%, OM1 90/95%, NL LV  . Cardiac catheterization N/A 05/01/2015    Procedure: Coronary Stent Intervention;  Surgeon: Belva Crome, MD;  Unsuccessful PCI OM due to tortuosity     Current Outpatient  Prescriptions  Medication Sig Dispense Refill  . AMBULATORY NON FORMULARY MEDICATION Take 90 mg by mouth 2 (two) times daily. Medication Name: Brilinta 90 mg BID (TWILIGHT Study provided )    . AMBULATORY NON FORMULARY MEDICATION Take 81 mg by mouth daily. Medication Name: ASA 81 Mg daily or PLACEBO (TWILIGHT RESEARCH No open label asa, study provided)    . amLODipine (NORVASC) 10 MG tablet TAKE 1 TABLET (10 MG TOTAL) BY MOUTH DAILY. 180 tablet 1  . Blood Glucose Monitoring Suppl (RELION CONFIRM GLUCOSE MONITOR) W/DEVICE KIT 1 Device by Does not apply route once. 1 kit 0  . esomeprazole (NEXIUM) 40 MG capsule Take 1 capsule (40 mg total) by mouth daily at 12 noon. (Patient taking differently: Take 40 mg by mouth daily as needed. ) 30 capsule 3  . glipiZIDE (GLIPIZIDE XL) 5 MG 24 hr tablet Take 1 tablet (5 mg total) by mouth daily with breakfast. 90 tablet 2  . glucose blood (RELION ULTIMA TEST) test strip 1 each by Other route 2 (two) times daily. And lancets 2/day 100 each 12  . Insulin Glargine (BASAGLAR KWIKPEN) 100 UNIT/ML Solostar Pen Inject 20 Units into the skin daily at 10 pm. 15 mL 2  . Insulin Pen Needle (NOVOFINE PLUS) 32G X 4 MM MISC Use 1x a day 100 each 11  . isosorbide mononitrate (IMDUR) 30 MG 24 hr tablet Take 1 tablet (30 mg total) by mouth daily. 30 tablet 11  . latanoprost (XALATAN) 0.005 % ophthalmic solution Place 1 drop into both eyes at bedtime.  5  . losartan-hydrochlorothiazide (HYZAAR) 100-12.5 MG tablet Take 1 tablet by mouth daily. 30 tablet 11  . metFORMIN (GLUCOPHAGE) 1000 MG tablet Take 1 tablet (1,000 mg total) by mouth 2 (two) times daily with a meal. 180 tablet 2  . metoprolol tartrate (LOPRESSOR) 25 MG tablet Take 1 tablet (25 mg total) by mouth 2 (two) times daily.    . nitroGLYCERIN (NITROSTAT) 0.4 MG SL tablet Place 1 tablet (0.4 mg total) under the tongue every 5 (five) minutes x 3 doses as needed. (CHEST PAIN) 25 tablet 12  . pravastatin (PRAVACHOL) 20 MG  tablet Take 1 tablet (20 mg total) by mouth daily. Start with 1 tablet 2 times a week and increased to daily as tolerated. 30 tablet 11  . tiZANidine (ZANAFLEX) 4 MG tablet Take 4 mg by mouth every 8 (eight) hours as needed. (MUSCLE SPASMS)  2  . traZODone (DESYREL) 50 MG tablet Take 1 tablet (50 mg total) by mouth at bedtime as needed for sleep. 30 tablet 3   No current facility-administered medications for this visit.    Allergies:   Statins    Social History:  The patient  reports  that he quit smoking about 32 years ago. His smoking use included Cigarettes. He has a 5 pack-year smoking history. He has never used smokeless tobacco. He reports that he drinks alcohol. He reports that he does not use illicit drugs.   Family History:  The patient's family history includes Alcohol abuse in his other; Colon cancer in his paternal uncle; Coronary artery disease in his mother and other; Diabetes in his maternal uncle, mother, paternal uncle, and sister; Heart disease in his mother; Stroke in his mother. There is no history of Colon polyps or Esophageal cancer.    ROS:  Please see the history of present illness.   Otherwise, review of systems are positive for none.   All other systems are reviewed and negative.    PHYSICAL EXAM: VS:  BP 140/82 mmHg  Pulse 76  Ht _0  (1.778 m)  Wt 258 lb 12.8 oz (117.391 kg)  BMI 37.13 kg/m2 , BMI Body mass index is 37.13 kg/(m^2). GEN: Well nourished, well developed, in no acute distressobese HEENT: normal Neck: no JVD, carotid bruits, or masses Cardiac: RRR; no murmurs, rubs, or gallops,no edema  Respiratory:  clear to auscultation bilaterally, normal work of breathing GI: soft, nontender, nondistended, + BS MS: no deformity or atrophy Skin: warm and dry, no rash Neuro:  Strength and sensation are intact Psych: euthymic mood, full affect   EKG:  EKG is ordered today. NSR HR 76. 1st deg AV block Q waves in septal leads   Recent Labs: 12/20/2014:  Pro B Natriuretic peptide (BNP) 25.0 04/30/2015: BUN 9; Creat 0.81; Hemoglobin 13.5; Platelets 251; Potassium 3.7; Sodium 138    Lipid Panel    Component Value Date/Time   CHOL 201* 05/09/2014 0909   TRIG 69.0 05/09/2014 0909   HDL 45.80 05/09/2014 0909   CHOLHDL 4 05/09/2014 0909   VLDL 13.8 05/09/2014 0909   LDLCALC 141* 05/09/2014 0909   LDLDIRECT 168.0 12/18/2012 1340      Wt Readings from Last 3 Encounters:  05/19/15 258 lb 12.8 oz (117.391 kg)  05/02/15 257 lb 15 oz (117 kg)  04/15/15 256 lb 12.8 oz (116.484 kg)      Other studies Reviewed: Additional studies/ records that were reviewed today include: [facility] Review of the above records demonstrates:   Cardiac Cath: 05/01/2015 1. 1st Diag lesion, 70% stenosed. 2. Ost 1st Diag to 1st Diag lesion, 70% stenosed. 3. 2nd Diag lesion, 75% stenosed. 4. Mid LAD to Dist LAD lesion, 60% stenosed. 5. Prox RCA to Dist RCA lesion, 25% stenosed. 6. Dist Cx lesion, 30% stenosed. 7. Ost 1st Diag lesion, 90% stenosed. 8. 1st Mrg-1 lesion, 95% stenosed. 9. 1st Mrg-2 lesion, 95% stenosed. Post intervention, there is a 95% residual stenosis. The lesion was previously treated with angioplasty.  Continued widely patent mid circumflex stent.  95% stenosis in the mid body of the first obtuse marginal, representing restenosis after angioplasty in August reduced the lesion to 50%.  90% ostial first diagonal  Otherwise no change in anatomy compared to the prior post PCI angiogram  Normal LV function  Failed first obtuse marginal PTCA due to inability to cross the stenosis. Angulation in the vessel prevented torque ability and we will unable to solve lack of intra-coronary support. Recommendation:  Continue medical therapy  Monitor overnight and discharge in a.m.  No change in medical regimen  Resume phase II cardiac rehabilitation   ASSESSMENT AND PLAN:  [name] is a 65 y.o. male with a history of  NSTEMI: CAD s/p DES to  LCx (11/2014), OM1 not stented due to inability of stent to track (likely due to wire tangle), HTN, HLD who presents to clinic for post hospital follow up.   CAD s/p recent cath with no PCI targets and patent LCX stent. Continued on medical therapy. Started on imdur. He was not taking this everyday bc he thought it was PRN for chest pain. Will have him start on it daily.  HLD: hx of intolerance to statins and unable to afford PCSK9 inhibitors.Trialed on pravastatin a couple times a week and tolerating this well  HTN: BP well controlled on current regimen. He thinks the metoprolol is causing his depression and fatigue. We will try cutting it in half from 52m BID to 12.583mBID and see if this helps. He has not been taking the imdur, so I will have him take this everyday. He was been told to monitor his BP at home and let usKoreanow if over 140/90  Current medicines are reviewed at length with the patient today.  The patient does not have concerns regarding medicines.  The following changes have been made:  Dec metoprolol 2543mID --> 12.5mg69mD.   Labs/ tests ordered today include:  No orders of the defined types were placed in this encounter.     Disposition:   FU with Dr. SmitTamala Julian6 months  Signed, THOMCrista Luria13/2017 2:40 PM    [place]  [id number]: [phone]: [phone]

## 2015-05-16 ENCOUNTER — Telehealth: Payer: Self-pay | Admitting: Interventional Cardiology

## 2015-05-16 NOTE — Telephone Encounter (Signed)
New message  Med refill   *STAT* If patient is at the pharmacy, call can be transferred to refill team.   1. Which medications need to be refilled? (please list name of each medication and dose if known) CELEBREX  2. Do they need a 30 day or 90 day supply? 30 day supply

## 2015-05-19 ENCOUNTER — Encounter: Payer: Self-pay | Admitting: Physician Assistant

## 2015-05-19 ENCOUNTER — Ambulatory Visit (INDEPENDENT_AMBULATORY_CARE_PROVIDER_SITE_OTHER): Payer: Federal, State, Local not specified - PPO | Admitting: Physician Assistant

## 2015-05-19 VITALS — BP 140/82 | HR 76 | Ht 70.0 in | Wt 258.8 lb

## 2015-05-19 DIAGNOSIS — I2510 Atherosclerotic heart disease of native coronary artery without angina pectoris: Secondary | ICD-10-CM

## 2015-05-19 DIAGNOSIS — I209 Angina pectoris, unspecified: Secondary | ICD-10-CM | POA: Diagnosis not present

## 2015-05-19 MED ORDER — METOPROLOL TARTRATE 25 MG PO TABS: 12.5000 mg | ORAL_TABLET | Freq: Two times a day (BID) | ORAL | Status: DC

## 2015-05-19 NOTE — Telephone Encounter (Signed)
Called pt per Lattie Haw, CMA, Dr. Tamala Julian assistant, to inform him that he would have to contact his PCP to get a refill on Celebrex. I advised the pt that if he has any other problems, questions or concerns to call our office. Pt verbalized understanding.

## 2015-05-19 NOTE — Telephone Encounter (Signed)
Rqt should go to pt pcp

## 2015-05-19 NOTE — Patient Instructions (Signed)
Medication Instructions:  Your physician has recommended you make the following change in your medication:  1- DECREASE Metoprolol 12.5 mg by mouth twice daily 2- Remember to take your Imdur daily  Labwork: NONE  Testing/Procedures: NONE  Follow-Up: Your physician wants you to follow-up in: 6 months with Dr. Tamala Julian. You will receive a reminder letter in the mail two months in advance. If you don't receive a letter, please call our office to schedule the follow-up appointment.  Your physician has requested that you regularly monitor and record your blood pressure readings at home. Please use the same machine at the same time of day to check your readings and record them to bring to your follow-up visit. Give our office a call if blood pressure is higher 140/90. [phone].  If you need a refill on your cardiac medications before your next appointment, please call your pharmacy.

## 2015-05-22 DIAGNOSIS — I10 Essential (primary) hypertension: Secondary | ICD-10-CM | POA: Diagnosis not present

## 2015-05-22 DIAGNOSIS — E119 Type 2 diabetes mellitus without complications: Secondary | ICD-10-CM | POA: Diagnosis not present

## 2015-05-22 DIAGNOSIS — Z6836 Body mass index (BMI) 36.0-36.9, adult: Secondary | ICD-10-CM | POA: Diagnosis not present

## 2015-05-22 DIAGNOSIS — E785 Hyperlipidemia, unspecified: Secondary | ICD-10-CM | POA: Diagnosis not present

## 2015-06-06 ENCOUNTER — Encounter (HOSPITAL_COMMUNITY): Payer: Self-pay

## 2015-06-06 ENCOUNTER — Emergency Department (INDEPENDENT_AMBULATORY_CARE_PROVIDER_SITE_OTHER)
Admission: EM | Admit: 2015-06-06 | Discharge: 2015-06-06 | Disposition: A | Discharge status: Home / Self Care | Payer: PPO | Source: Home / Self Care | Attending: Family Medicine | Admitting: Family Medicine

## 2015-06-06 DIAGNOSIS — A09 Infectious gastroenteritis and colitis, unspecified: Secondary | ICD-10-CM

## 2015-06-06 DIAGNOSIS — S39012A Strain of muscle, fascia and tendon of lower back, initial encounter: Secondary | ICD-10-CM

## 2015-06-06 DIAGNOSIS — R197 Diarrhea, unspecified: Secondary | ICD-10-CM

## 2015-06-06 NOTE — ED Provider Notes (Signed)
CSN: [id number]     Arrival date & time 06/06/15  1308 History   First MD Initiated Contact with Patient 06/06/15 1412     Chief Complaint  Patient presents with  . Back Pain  . Diarrhea   (Consider location/radiation/quality/duration/timing/severity/associated sxs/prior Treatment) HPI Comments: 65 year old male complaining of diarrhea for 2 days. He states it is frequently too numerous to count. Since he has been to the urgent care has had no episodes of diarrhea. Yesterday he had some mild  cramping in the abdomenanterior. Today he is pain-free, no abdominal pain or chills. No fevers. He has seen no blood in the diarrhea. He describes it as loose stools.   Second complaint is that of pain to the lower right back. He knows of no particular injury. He states is actually feeling much better and this is not a significant concern for him today. He is more concerned about his diarrhea.    Past Medical History  Diagnosis Date  . Anemia   . GERD (gastroesophageal reflux disease)   . Hyperlipidemia   . HTN (hypertension)   . Depression   . Adenomatous colon polyp 2011  . Chest pain     pleuritic  . Refusal of blood transfusions as patient is Jehovah's Witness   . Coronary artery disease   . Pneumonia ~ 2014 X 1  . OSA on CPAP   . Type II or unspecified type diabetes mellitus without mention of complication, not stated as uncontrolled   . Osteoarthritis   . Arthritis     "back, knees" (05/01/2015)  . Chronic lower back pain   . Angina pectoris ([place]) 05/01/2015    med rx for 95% OM (u/a to access due to tortuosity), 95% D1 and other moderate CAD at cath   Past Surgical History  Procedure Laterality Date  . Pilonidal cyst excision    . Closed reduction shoulder dislocation Right ~ 1975    "& reattached muscle"  . Colonoscopy w/ biopsies and polypectomy  X 2  . Cardiac catheterization  2004    patent coronary arteries  . Cardiac catheterization N/A 11/06/2014    Procedure: Left Heart  Cath and Coronary Angiography;  Surgeon: Belva Crome, MD;  Location: [facility];  Service: Cardiovascular;  Laterality: N/A;  . Cardiac catheterization  05/01/2015    "tried to put stent in but couldn't"  . Esophagogastroduodenoscopy endoscopy    . Cardiac catheterization N/A 05/01/2015    Procedure: Left Heart Cath and Coronary Angiography;  Surgeon: Belva Crome, MD; LAD 60%, oD1 90%, pD1 70%, D2 70%, CFX patent stent, 30% distal to prev stent, pRCA 20%, OM1 90/95%, NL LV  . Cardiac catheterization N/A 05/01/2015    Procedure: Coronary Stent Intervention;  Surgeon: Belva Crome, MD;  Unsuccessful PCI OM due to tortuosity   Family History  Problem Relation Age of Onset  . Coronary artery disease Mother   . Stroke Mother   . Diabetes Maternal Uncle     x2  . Alcohol abuse Other   . Coronary artery disease Other     CABG  . Heart disease Mother   . Diabetes Mother   . Diabetes Sister   . Diabetes Paternal Uncle     x2  . Colon cancer Paternal Uncle   . Colon polyps Neg Hx   . Esophageal cancer Neg Hx    Social History  Substance Use Topics  . Smoking status: Former Smoker -- 0.50 packs/day for 10 years  Types: Cigarettes    Quit date: 04/06/1983  . Smokeless tobacco: Never Used  . Alcohol Use: 0.0 oz/week    0 Standard drinks or equivalent per week     Comment: `/26/2017 "might drink a beer q couple months mostly; summertime I might drink 2-3 beers/week"    Review of Systems  Constitutional: Positive for activity change. Negative for fever.  HENT: Negative.   Respiratory: Negative.   Cardiovascular: Negative for chest pain and leg swelling.  Gastrointestinal: Positive for diarrhea. Negative for nausea, vomiting, constipation, blood in stool, abdominal distention and rectal pain.  Genitourinary: Negative.   Musculoskeletal: Positive for back pain. Negative for joint swelling, neck pain and neck stiffness.  Skin: Negative.   Neurological: Negative.    Psychiatric/Behavioral: Negative.   All other systems reviewed and are negative.   Allergies  Statins  Home Medications   Prior to Admission medications   Medication Sig Start Date End Date Taking? Authorizing Provider  amLODipine (NORVASC) 10 MG tablet TAKE 1 TABLET (10 MG TOTAL) BY MOUTH DAILY. 04/19/14  Yes [name], MD  glipiZIDE (GLIPIZIDE XL) 5 MG 24 hr tablet Take 1 tablet (5 mg total) by mouth daily with breakfast. 03/20/15  Yes [name], MD  AMBULATORY NON FORMULARY MEDICATION Take 90 mg by mouth 2 (two) times daily. Medication Name: Brilinta 90 mg BID (TWILIGHT Study provided ) 12/31/14   Burnell Blanks, MD  AMBULATORY NON FORMULARY MEDICATION Take 81 mg by mouth daily. Medication Name: ASA 81 Mg daily or PLACEBO (TWILIGHT RESEARCH No open label asa, study provided) 01/22/15   Burnell Blanks, MD  Blood Glucose Monitoring Suppl (RELION CONFIRM GLUCOSE MONITOR) W/DEVICE KIT 1 Device by Does not apply route once. 05/01/12   Renato Shin, MD  esomeprazole (NEXIUM) 40 MG capsule Take 1 capsule (40 mg total) by mouth daily at 12 noon. Patient taking differently: Take 40 mg by mouth daily as needed.  10/14/14   Milus Banister, MD  glucose blood (RELION ULTIMA TEST) test strip 1 each by Other route 2 (two) times daily. And lancets 2/day 05/01/12   Renato Shin, MD  Insulin Glargine [facility]) 100 UNIT/ML Solostar Pen Inject 20 Units into the skin daily at 10 pm. 05/09/15   [name], MD  Insulin Pen Needle (NOVOFINE PLUS) 32G X 4 MM MISC Use 1x a day 05/09/14   [name], MD  isosorbide mononitrate (IMDUR) 30 MG 24 hr tablet Take 1 tablet (30 mg total) by mouth daily. 05/02/15   Rhonda G Barrett, PA-C  latanoprost (XALATAN) 0.005 % ophthalmic solution Place 1 drop into both eyes at bedtime. 03/21/15   Historical Provider, MD  losartan-hydrochlorothiazide (HYZAAR) 100-12.5 MG tablet Take 1 tablet by mouth daily. 02/12/15   Belva Crome, MD   metFORMIN (GLUCOPHAGE) 1000 MG tablet Take 1 tablet (1,000 mg total) by mouth 2 (two) times daily with a meal. 08/17/13   [name], MD  metoprolol tartrate (LOPRESSOR) 25 MG tablet Take 0.5 tablets (12.5 mg total) by mouth 2 (two) times daily. 05/19/15   Eileen Stanford, PA-C  nitroGLYCERIN (NITROSTAT) 0.4 MG SL tablet Place 1 tablet (0.4 mg total) under the tongue every 5 (five) minutes x 3 doses as needed. (CHEST PAIN) 05/02/15   Evelene Croon Barrett, PA-C  pravastatin (PRAVACHOL) 20 MG tablet Take 1 tablet (20 mg total) by mouth daily. Start with 1 tablet 2 times a week and increased to daily as tolerated. 05/02/15   Evelene Croon Barrett,  PA-C  tiZANidine (ZANAFLEX) 4 MG tablet Take 4 mg by mouth every 8 (eight) hours as needed. (MUSCLE SPASMS) 03/20/15   Historical Provider, MD  traZODone (DESYREL) 50 MG tablet Take 1 tablet (50 mg total) by mouth at bedtime as needed for sleep. 12/06/14   Belva Crome, MD   Meds Ordered and Administered this Visit  Medications - No data to display  BP 139/76 mmHg  Pulse 69  Temp(Src) 97.7 F (36.5 C) (Oral)  Resp 16  SpO2 97% No data found.   Physical Exam  Constitutional: He is oriented to person, place, and time. He appears well-developed and well-nourished. No distress.  Eyes: Conjunctivae and EOM are normal.  Neck: Normal range of motion. Neck supple.  Cardiovascular: Normal rate and regular rhythm.   Pulmonary/Chest: Effort normal and breath sounds normal. No respiratory distress.  Abdominal: Soft. Bowel sounds are normal. He exhibits no distension and no mass. There is no tenderness. There is no rebound and no guarding.  Musculoskeletal: He exhibits no edema.  Minimal tenderness to the right para lumbar musculature. Patient states that his pain is almost gone. No spinal tenderness, deformity or swelling. He exhibits normal flexion and extension. He is able to get onto and off the table without having difficulty or pain.  Neurological: He is  alert and oriented to person, place, and time. He exhibits normal muscle tone.  Skin: Skin is warm and dry.  Psychiatric: He has a normal mood and affect.  Nursing note and vitals reviewed.   ED Course  Procedures (including critical care time)  Labs Review Labs Reviewed - No data to display  Imaging Review No results found.   Visual Acuity Review  Right Eye Distance:   Left Eye Distance:   Bilateral Distance:    Right Eye Near:   Left Eye Near:    Bilateral Near:         MDM   1. Diarrhea of presumed infectious origin   2. Low back strain, initial encounter    Replace fluids with Pedialyte Immodium AD up to 2 or 3 times a day just to slow the diarrhea down; do not stop it with medicine. Let the virus shed out of the body.  Heat to the back as needed.  Pt st this is resolving, better.    Janne Napoleon, NP 06/06/15 1456

## 2015-06-06 NOTE — Discharge Instructions (Signed)
Diarrhea Replace fluids with Pedialyte Immodium AD up to 2 or 3 times a day just to slow the diarrhea down; do not stop it with medicine. Let the virus shed out of the body.  Diarrhea is frequent loose and watery bowel movements. It can cause you to feel weak and dehydrated. Dehydration can cause you to become tired and thirsty, have a dry mouth, and have decreased urination that often is dark yellow. Diarrhea is a sign of another problem, most often an infection that will not last long. In most cases, diarrhea typically lasts 2-3 days. However, it can last longer if it is a sign of something more serious. It is important to treat your diarrhea as directed by your caregiver to lessen or prevent future episodes of diarrhea. CAUSES  Some common causes include:  Gastrointestinal infections caused by viruses, bacteria, or parasites.  Food poisoning or food allergies.  Certain medicines, such as antibiotics, chemotherapy, and laxatives.  Artificial sweeteners and fructose.  Digestive disorders. HOME CARE INSTRUCTIONS  Ensure adequate fluid intake (hydration): Have 1 cup (8 oz) of fluid for each diarrhea episode. Avoid fluids that contain simple sugars or sports drinks, fruit juices, whole milk products, and sodas. Your urine should be clear or pale yellow if you are drinking enough fluids. Hydrate with an oral rehydration solution that you can purchase at pharmacies, retail stores, and online. You can prepare an oral rehydration solution at home by mixing the following ingredients together:   - tsp table salt.   tsp baking soda.   tsp salt substitute containing potassium chloride.  1  tablespoons sugar.  1 L (34 oz) of water.  Certain foods and beverages may increase the speed at which food moves through the gastrointestinal (GI) tract. These foods and beverages should be avoided and include:  Caffeinated and alcoholic beverages.  High-fiber foods, such as raw fruits and vegetables,  nuts, seeds, and whole grain breads and cereals.  Foods and beverages sweetened with sugar alcohols, such as xylitol, sorbitol, and mannitol.  Some foods may be well tolerated and may help thicken stool including:  Starchy foods, such as rice, toast, pasta, low-sugar cereal, oatmeal, grits, baked potatoes, crackers, and bagels.  Bananas.  Applesauce.  Add probiotic-rich foods to help increase healthy bacteria in the GI tract, such as yogurt and fermented milk products.  Wash your hands well after each diarrhea episode.  Only take over-the-counter or prescription medicines as directed by your caregiver.  Take a warm bath to relieve any burning or pain from frequent diarrhea episodes. SEEK IMMEDIATE MEDICAL CARE IF:   You are unable to keep fluids down.  You have persistent vomiting.  You have blood in your stool, or your stools are black and tarry.  You do not urinate in 6-8 hours, or there is only a small amount of very dark urine.  You have abdominal pain that increases or localizes.  You have weakness, dizziness, confusion, or light-headedness.  You have a severe headache.  Your diarrhea gets worse or does not get better.  You have a fever or persistent symptoms for more than 2-3 days.  You have a fever and your symptoms suddenly get worse. MAKE SURE YOU:   Understand these instructions.  Will watch your condition.  Will get help right away if you are not doing well or get worse.   This information is not intended to replace advice given to you by your health care provider. Make sure you discuss any questions you have  with your health care provider.   Document Released: 03/12/2002 Document Revised: 04/12/2014 Document Reviewed: 11/28/2011 Elsevier Interactive Patient Education 2016 [place] Choices to Help Relieve Diarrhea, Adult When you have diarrhea, the foods you eat and your eating habits are very important. Choosing the right foods and drinks  can help relieve diarrhea. Also, because diarrhea can last up to 7 days, you need to replace lost fluids and electrolytes (such as sodium, potassium, and chloride) in order to help prevent dehydration.  WHAT GENERAL GUIDELINES DO I NEED TO FOLLOW?  Slowly drink 1 cup (8 oz) of fluid for each episode of diarrhea. If you are getting enough fluid, your urine will be clear or pale yellow.  Eat starchy foods. Some good choices include white rice, white toast, pasta, low-fiber cereal, baked potatoes (without the skin), saltine crackers, and bagels.  Avoid large servings of any cooked vegetables.  Limit fruit to two servings per day. A serving is  cup or 1 small piece.  Choose foods with less than 2 g of fiber per serving.  Limit fats to less than 8 tsp (38 g) per day.  Avoid fried foods.  Eat foods that have probiotics in them. Probiotics can be found in certain dairy products.  Avoid foods and beverages that may increase the speed at which food moves through the stomach and intestines (gastrointestinal tract). Things to avoid include:  High-fiber foods, such as dried fruit, raw fruits and vegetables, nuts, seeds, and whole grain foods.  Spicy foods and high-fat foods.  Foods and beverages sweetened with high-fructose corn syrup, honey, or sugar alcohols such as xylitol, sorbitol, and mannitol. WHAT FOODS ARE RECOMMENDED? Grains White rice. White, [place], or pita breads (fresh or toasted), including plain rolls, buns, or bagels. White pasta. Saltine, soda, or graham crackers. Pretzels. Low-fiber cereal. Cooked cereals made with water (such as cornmeal, farina, or cream cereals). Plain muffins. Matzo. Melba toast. Zwieback.  Vegetables Potatoes (without the skin). Strained tomato and vegetable juices. Most well-cooked and canned vegetables without seeds. Tender lettuce. Fruits Cooked or canned applesauce, apricots, cherries, fruit cocktail, grapefruit, peaches, pears, or plums. Fresh  bananas, apples without skin, cherries, grapes, cantaloupe, grapefruit, peaches, oranges, or plums.  Meat and Other Protein Products Baked or boiled chicken. Eggs. Tofu. Fish. Seafood. Smooth peanut butter. Ground or well-cooked tender beef, ham, veal, lamb, pork, or poultry.  Dairy Plain yogurt, kefir, and unsweetened liquid yogurt. Lactose-free milk, buttermilk, or soy milk. Plain hard cheese. Beverages Sport drinks. Clear broths. Diluted fruit juices (except prune). Regular, caffeine-free sodas such as ginger ale. Water. Decaffeinated teas. Oral rehydration solutions. Sugar-free beverages not sweetened with sugar alcohols. Other Bouillon, broth, or soups made from recommended foods.  The items listed above may not be a complete list of recommended foods or beverages. Contact your dietitian for more options. WHAT FOODS ARE NOT RECOMMENDED? Grains Whole grain, whole wheat, bran, or rye breads, rolls, pastas, crackers, and cereals. Wild or brown rice. Cereals that contain more than 2 g of fiber per serving. Corn tortillas or taco shells. Cooked or dry oatmeal. Granola. Popcorn. Vegetables Raw vegetables. Cabbage, broccoli, Brussels sprouts, artichokes, baked beans, beet greens, corn, kale, legumes, peas, sweet potatoes, and yams. Potato skins. Cooked spinach and cabbage. Fruits Dried fruit, including raisins and dates. Raw fruits. Stewed or dried prunes. Fresh apples with skin, apricots, mangoes, pears, raspberries, and strawberries.  Meat and Other Protein Products Chunky peanut butter. Nuts and seeds. Beans and lentils. Berniece Salines.  Dairy High-fat  cheeses. Milk, chocolate milk, and beverages made with milk, such as milk shakes. Cream. Ice cream. Sweets and Desserts Sweet rolls, doughnuts, and sweet breads. Pancakes and waffles. Fats and Oils Butter. Cream sauces. Margarine. Salad oils. Plain salad dressings. Olives. Avocados.  Beverages Caffeinated beverages (such as coffee, tea, soda, or  energy drinks). Alcoholic beverages. Fruit juices with pulp. Prune juice. Soft drinks sweetened with high-fructose corn syrup or sugar alcohols. Other Coconut. Hot sauce. Chili powder. Mayonnaise. Gravy. Cream-based or milk-based soups.  The items listed above may not be a complete list of foods and beverages to avoid. Contact your dietitian for more information. WHAT SHOULD I DO IF I BECOME DEHYDRATED? Diarrhea can sometimes lead to dehydration. Signs of dehydration include dark urine and dry mouth and skin. If you think you are dehydrated, you should rehydrate with an oral rehydration solution. These solutions can be purchased at pharmacies, retail stores, or online.  Drink -1 cup (120-240 mL) of oral rehydration solution each time you have an episode of diarrhea. If drinking this amount makes your diarrhea worse, try drinking smaller amounts more often. For example, drink 1-3 tsp (5-15 mL) every 5-10 minutes.  A general rule for staying hydrated is to drink 1-2 L of fluid per day. Talk to your health care provider about the specific amount you should be drinking each day. Drink enough fluids to keep your urine clear or pale yellow.   This information is not intended to replace advice given to you by your health care provider. Make sure you discuss any questions you have with your health care provider.   Document Released: 06/12/2003 Document Revised: 04/12/2014 Document Reviewed: 02/12/2013 Elsevier Interactive Patient Education 2016 Else[employer]  Lumbosacral Strain Lumbosacral strain is a strain of any of the parts that make up your lumbosacral vertebrae. Your lumbosacral vertebrae are the bones that make up the lower third of your backbone. Your lumbosacral vertebrae are held together by muscles and tough, fibrous tissue (ligaments).  CAUSES  A sudden blow to your back can cause lumbosacral strain. Also, anything that causes an excessive stretch of the muscles in the low back can cause this  strain. This is typically seen when people exert themselves strenuously, fall, lift heavy objects, bend, or crouch repeatedly. RISK FACTORS  Physically demanding work.  Participation in pushing or pulling sports or sports that require a sudden twist of the back (tennis, golf, baseball).  Weight lifting.  Excessive lower back curvature.  Forward-tilted pelvis.  Weak back or abdominal muscles or both.  Tight hamstrings. SIGNS AND SYMPTOMS  Lumbosacral strain may cause pain in the area of your injury or pain that moves (radiates) down your leg.  DIAGNOSIS Your health care provider can often diagnose lumbosacral strain through a physical exam. In some cases, you may need tests such as X-ray exams.  TREATMENT  Treatment for your lower back injury depends on many factors that your clinician will have to evaluate. However, most treatment will include the use of anti-inflammatory medicines. HOME CARE INSTRUCTIONS   Avoid hard physical activities (tennis, racquetball, waterskiing) if you are not in proper physical condition for it. This may aggravate or create problems.  If you have a back problem, avoid sports requiring sudden body movements. Swimming and walking are generally safer activities.  Maintain good posture.  Maintain a healthy weight.  For acute conditions, you may put ice on the injured area.  Put ice in a plastic bag.  Place a towel between your skin and the  bag.  Leave the ice on for 20 minutes, 2-3 times a day.  When the low back starts healing, stretching and strengthening exercises may be recommended. SEEK MEDICAL CARE IF:  Your back pain is getting worse.  You experience severe back pain not relieved with medicines. SEEK IMMEDIATE MEDICAL CARE IF:   You have numbness, tingling, weakness, or problems with the use of your arms or legs.  There is a change in bowel or bladder control.  You have increasing pain in any area of the body, including your belly  (abdomen).  You notice shortness of breath, dizziness, or feel faint.  You feel sick to your stomach (nauseous), are throwing up (vomiting), or become sweaty.  You notice discoloration of your toes or legs, or your feet get very cold. MAKE SURE YOU:   Understand these instructions.  Will watch your condition.  Will get help right away if you are not doing well or get worse.   This information is not intended to replace advice given to you by your health care provider. Make sure you discuss any questions you have with your health care provider.   Document Released: 12/30/2004 Document Revised: 04/12/2014 Document Reviewed: 11/08/2012 Elsevier Interactive Patient Education [employer].

## 2015-06-06 NOTE — ED Notes (Signed)
Patient states that he is having lower back pain and diarrhea x2 days Patient has not taking anything for pain, thinks he may have a possible virus No acute distress

## 2015-06-16 ENCOUNTER — Other Ambulatory Visit: Payer: Self-pay | Admitting: Internal Medicine

## 2015-06-19 ENCOUNTER — Encounter: Payer: Self-pay | Admitting: Internal Medicine

## 2015-06-19 ENCOUNTER — Ambulatory Visit (INDEPENDENT_AMBULATORY_CARE_PROVIDER_SITE_OTHER): Payer: Federal, State, Local not specified - PPO | Admitting: Internal Medicine

## 2015-06-19 VITALS — BP 106/62 | HR 86 | Temp 97.8°F | Resp 12 | Wt 251.0 lb

## 2015-06-19 DIAGNOSIS — E785 Hyperlipidemia, unspecified: Secondary | ICD-10-CM | POA: Diagnosis not present

## 2015-06-19 DIAGNOSIS — E1159 Type 2 diabetes mellitus with other circulatory complications: Secondary | ICD-10-CM

## 2015-06-19 DIAGNOSIS — Z794 Long term (current) use of insulin: Secondary | ICD-10-CM | POA: Diagnosis not present

## 2015-06-19 NOTE — Progress Notes (Signed)
Patient ID: [name], male   DOB: 09/20/1950, 65 y.o.   MRN: [id number]  HPI: [name] is a 65 y.o.-year-old male, returning for f/u for DM2, dx 2007, insulin-dependent since 2014, uncontrolled, with complications (CAD - s/p NSTEMI 11/2014 - stent; ED). Last visit 3 mo ago. PCP: Dr [name].  Last hemoglobin A1c was: Lab Results  Component Value Date   HGBA1C 7.2 03/20/2015   HGBA1C 7.4 12/17/2014   HGBA1C 7.1* 08/21/2014   Pt is on a regimen of: - Metformin 1000 mg 2x a day - Glipizide XL 5 >> 10 >> 5 mg daily in am (decreased as he was having low CBGs in am) - Lantus (did not like Toujeo) 20 units at bedtime - started 07/2014  He was Levemir 140 units in am >> decreased to 70 units>> stopped as this was too expensive for him He stopped Welchol 3.7g at bedtime a day - at last  Stopped Farxiga 10 mg b/c frequent urination and urgency. He was on Glipizide in the past. He was on Metformin in the past >> no N/V.   Pt checks his sugars 2x a day and they are: - am: 49x1 (today), 89-130, 142 >> 90, 166-223, 257, 306 >> 90-163 >> 99-140 >> 120-128 >> 72, 98-156 - 2h after b'fast: 150s >> 102, 146, 171, 191 >> n/c >> 128, 134 >> n/c - before lunch: n/c >> 112-168 (mostly on the low limit) >> n/c >> 120, 224-245 >> 206 >> n/c >> 140s >> n/c - 2h after lunch: n/c >> 210 (corrected the 79 in am) >> n/c >> 144, 205 >> 120, 161 - before dinner: n/c >> 143 >> n/c >> 134-225, 263 >> 118-205 >> 140s >> 140s >> n/c - 2h after dinner: n/c >> 143, 175 >> n/c  - bedtime: n/c >> 105-165, 185 >> 99-170 >> 160s >> 151 - nighttime: n/c >> 134, 210 >> 99-190 >> 94, 190-305 >> 148-216 >> n/c No lows.Lowest sugar was 90; ? has hypoglycemia awareness  Highest sugar was 360 >> 190 >> 305 >> 200s.  - + mild CKD, last BUN/creatinine:  Lab Results  Component Value Date   BUN 9 04/30/2015   CREATININE 0.81 04/30/2015  On Losartan. - last set of lipids: Lab Results  Component Value Date   CHOL  201* 05/09/2014   HDL 45.80 05/09/2014   LDLCALC 141* 05/09/2014   LDLDIRECT 168.0 12/18/2012   TRIG 69.0 05/09/2014   CHOLHDL 4 05/09/2014  On Pravastatin qod. - last eye exam was in 12/17/2014. No DR.  - no numbness and tingling in his feet.  Pt was admitted for CP in 11/2014: NSTEMI >> CAD >> PTCA >> stent placed. He was in cardiac rehab  >> finished.  I reviewed pt's medications, allergies, PMH, social hx, family hx, and changes were documented in the history of present illness. Otherwise, unchanged from my initial visit note. He decreased Metoprolol dose to 1/2  B/c fatigue >> now feeling better.  ROS: Constitutional: + weight loss, no fatigue, no subjective hyperthermia/hypothermia, + Less nocturia Eyes: no blurry vision, no xerophthalmia ENT: no sore throat, no nodules palpated in throat, no dysphagia/odynophagia, no hoarseness Cardiovascular: no CP/SOB/palpitations/+ leg swelling Respiratory: no cough/SOB Gastrointestinal: no N/V/D/C Musculoskeletal: no muscle/+ less joint aches Skin: no rashes Neurological: no tremors/numbness/tingling/dizziness  PE: BP 106/62 mmHg  Pulse 86  Temp(Src) 97.8 F (36.6 C) (Oral)  Resp 12  Wt 251 lb (113.853 kg)  SpO2 95% Body mass  index is 36.01 kg/(m^2). Wt Readings from Last 3 Encounters:  06/19/15 251 lb (113.853 kg)  05/19/15 258 lb 12.8 oz (117.391 kg)  05/02/15 257 lb 15 oz (117 kg)   Constitutional: overweight, in NAD Eyes: PERRLA, EOMI, no exophthalmos ENT: moist mucous membranes, no thyromegaly, no cervical lymphadenopathy Cardiovascular: irreg irreg R, No MRG, + RLE edema (h/o knee surgery) Respiratory: CTA B Gastrointestinal: abdomen soft, NT, ND, BS+ Musculoskeletal: no deformities, strength intact in all 4 Skin: moist, warm, no rashes Neurological: no tremor with outstretched hands, DTR normal in all 4  ASSESSMENT: 1. DM2, insulin-dependent, uncontrolled, with complications - CAD, s/p NSTEMI, s/p stent 11/2014 -  ED  PLAN:  1. Patient with long-standing, uncontrolled diabetes, with now better control.+ some fluctuations, but difficult to understand patterns as his schedule is very unusual. Will continue current regimen. - I suggested to:  Patient Instructions  Please continue: - Basaglar 20 units at bedtime - Glipizide XL 5 mg in am - Metformin 1000 mg 2x a day with meals.  Please return in 3 months with your sugar log.   - continue checking sugars at different times of the day - check 2 times a day, rotating checks - advised for yearly eye exams, he is up to date  - will check HbA1c, LFTs and Lipids today - Return to clinic in 3 mo with sugar log   Office Visit on 06/19/2015  Component Date Value Ref Range Status  . Hgb A1c MFr Bld 06/19/2015 7.4* 4.6 - 6.5 % Final   Glycemic Control Guidelines for People with Diabetes:Non Diabetic:  <6%Goal of Therapy: <7%Additional Action Suggested:  >8%   . Total Bilirubin 06/19/2015 0.4  0.2 - 1.2 mg/dL Final  . Bilirubin, Direct 06/19/2015 0.1  0.0 - 0.3 mg/dL Final  . Alkaline Phosphatase 06/19/2015 85  39 - 117 U/L Final  . AST 06/19/2015 16  0 - 37 U/L Final  . ALT 06/19/2015 17  0 - 53 U/L Final  . Total Protein 06/19/2015 7.5  6.0 - 8.3 g/dL Final  . Albumin 06/19/2015 4.0  3.5 - 5.2 g/dL Final  . Cholesterol 06/19/2015 164  0 - 200 mg/dL Final   ATP III Classification       Desirable:  < 200 mg/dL               Borderline High:  200 - 239 mg/dL          High:  > = 240 mg/dL  . Triglycerides 06/19/2015 61.0  0.0 - 149.0 mg/dL Final   Normal:  <150 mg/dLBorderline High:  150 - 199 mg/dL  . HDL 06/19/2015 38.40* >39.00 mg/dL Final  . VLDL 06/19/2015 12.2  0.0 - 40.0 mg/dL Final  . LDL Cholesterol 06/19/2015 114* 0 - 99 mg/dL Final  . Total CHOL/HDL Ratio 06/19/2015 4   Final                  Men          Women1/2 Average Risk     3.4          3.3Average Risk          5.0          4.42X Average Risk          9.6          7.13X Average Risk           15.0  11.0                      . NonHDL 06/19/2015 125.70   Final   NOTE:  Non-HDL goal should be 30 mg/dL higher than patient's LDL goal (i.e. LDL goal of < 70 mg/dL, would have non-HDL goal of < 100 mg/dL)   HbA1c is a little higher. LFTs normal. Cholesterol levels with improving LDL, but also lower HDL.

## 2015-06-19 NOTE — Patient Instructions (Signed)
Patient Instructions  Please continue: - Lantus 20 units at bedtime - Glipizide XL 5 mg in am - Metformin 1000 mg 2x a day with meals.  Please return in 3 months with your sugar log.   Please stop at the lab.

## 2015-06-20 LAB — HEPATIC FUNCTION PANEL
ALT: 17 U/L (ref 0–53)
AST: 16 U/L (ref 0–37)
Albumin: 4 g/dL (ref 3.5–5.2)
Alkaline Phosphatase: 85 U/L (ref 39–117)
Bilirubin, Direct: 0.1 mg/dL (ref 0.0–0.3)
TOTAL PROTEIN: 7.5 g/dL (ref 6.0–8.3)
Total Bilirubin: 0.4 mg/dL (ref 0.2–1.2)

## 2015-06-20 LAB — LIPID PANEL
CHOL/HDL RATIO: 4
CHOLESTEROL: 164 mg/dL (ref 0–200)
HDL: 38.4 mg/dL — ABNORMAL LOW (ref >39.00)
LDL Cholesterol: 114 mg/dL — ABNORMAL HIGH (ref 0–99)
NonHDL: 125.7
TRIGLYCERIDES: 61 mg/dL (ref 0.0–149.0)
VLDL: 12.2 mg/dL (ref 0.0–40.0)

## 2015-06-20 LAB — HEMOGLOBIN A1C: Hgb A1c MFr Bld: 7.4 % — ABNORMAL HIGH (ref 4.6–6.5)

## 2015-07-15 ENCOUNTER — Encounter: Payer: Self-pay | Admitting: *Deleted

## 2015-07-15 DIAGNOSIS — Z006 Encounter for examination for normal comparison and control in clinical research program: Secondary | ICD-10-CM

## 2015-07-15 NOTE — Progress Notes (Signed)
[facility] Research Month 9 visit completed. Patient returned bottle # [id number] 31 pills remaining and Bottle # T [id number] 133 pills remaining. Dispensed Bottle # [id number] ;[id number]. Patient denies any adverse events or bleeding events. Next visit window is 12/31/15-02/27/16, at this visit he will be take off Brilinta and study drug. Further antiplatelet therapy will be at the discretion of cardiologist. Questions encouraged and answered.

## 2015-08-04 DIAGNOSIS — N3281 Overactive bladder: Secondary | ICD-10-CM | POA: Diagnosis not present

## 2015-08-04 DIAGNOSIS — Z0000 Encounter for general adult medical examination without abnormal findings: Secondary | ICD-10-CM | POA: Diagnosis not present

## 2015-08-04 DIAGNOSIS — N400 Enlarged prostate without lower urinary tract symptoms: Secondary | ICD-10-CM | POA: Diagnosis not present

## 2015-08-14 DIAGNOSIS — E784 Other hyperlipidemia: Secondary | ICD-10-CM | POA: Diagnosis not present

## 2015-08-14 DIAGNOSIS — I10 Essential (primary) hypertension: Secondary | ICD-10-CM | POA: Diagnosis not present

## 2015-08-19 ENCOUNTER — Encounter (HOSPITAL_COMMUNITY): Payer: Self-pay | Admitting: Interventional Cardiology

## 2015-08-20 DIAGNOSIS — D126 Benign neoplasm of colon, unspecified: Secondary | ICD-10-CM | POA: Diagnosis not present

## 2015-08-20 DIAGNOSIS — Z794 Long term (current) use of insulin: Secondary | ICD-10-CM | POA: Diagnosis not present

## 2015-08-20 DIAGNOSIS — R972 Elevated prostate specific antigen [PSA]: Secondary | ICD-10-CM | POA: Diagnosis not present

## 2015-08-20 DIAGNOSIS — D692 Other nonthrombocytopenic purpura: Secondary | ICD-10-CM | POA: Diagnosis not present

## 2015-08-20 DIAGNOSIS — I119 Hypertensive heart disease without heart failure: Secondary | ICD-10-CM | POA: Diagnosis not present

## 2015-08-20 DIAGNOSIS — Z0000 Encounter for general adult medical examination without abnormal findings: Secondary | ICD-10-CM | POA: Diagnosis not present

## 2015-08-20 DIAGNOSIS — I209 Angina pectoris, unspecified: Secondary | ICD-10-CM | POA: Diagnosis not present

## 2015-08-20 DIAGNOSIS — G4733 Obstructive sleep apnea (adult) (pediatric): Secondary | ICD-10-CM | POA: Diagnosis not present

## 2015-08-20 DIAGNOSIS — F319 Bipolar disorder, unspecified: Secondary | ICD-10-CM | POA: Diagnosis not present

## 2015-08-20 DIAGNOSIS — Z1389 Encounter for screening for other disorder: Secondary | ICD-10-CM | POA: Diagnosis not present

## 2015-08-20 DIAGNOSIS — Z6835 Body mass index (BMI) 35.0-35.9, adult: Secondary | ICD-10-CM | POA: Diagnosis not present

## 2015-08-20 DIAGNOSIS — M1990 Unspecified osteoarthritis, unspecified site: Secondary | ICD-10-CM | POA: Diagnosis not present

## 2015-08-23 ENCOUNTER — Other Ambulatory Visit: Payer: Self-pay | Admitting: Internal Medicine

## 2015-08-25 ENCOUNTER — Other Ambulatory Visit: Payer: Self-pay | Admitting: *Deleted

## 2015-08-25 MED ORDER — METOPROLOL TARTRATE 25 MG PO TABS: 12.5000 mg | ORAL_TABLET | Freq: Two times a day (BID) | ORAL | Status: DC

## 2015-08-25 NOTE — Telephone Encounter (Signed)
Rx has been sent to the pharmacy electronically. ° °

## 2015-08-27 ENCOUNTER — Other Ambulatory Visit: Payer: Self-pay | Admitting: *Deleted

## 2015-08-27 MED ORDER — METFORMIN HCL 500 MG PO TABS: ORAL_TABLET | ORAL | Status: DC

## 2015-09-02 DIAGNOSIS — Z1212 Encounter for screening for malignant neoplasm of rectum: Secondary | ICD-10-CM | POA: Diagnosis not present

## 2015-09-24 ENCOUNTER — Encounter: Payer: Self-pay | Admitting: Physician Assistant

## 2015-09-25 ENCOUNTER — Ambulatory Visit (INDEPENDENT_AMBULATORY_CARE_PROVIDER_SITE_OTHER): Payer: Federal, State, Local not specified - PPO | Admitting: Internal Medicine

## 2015-09-25 ENCOUNTER — Encounter: Payer: Self-pay | Admitting: Internal Medicine

## 2015-09-25 VITALS — BP 120/80 | HR 82 | Ht 70.0 in | Wt 248.8 lb

## 2015-09-25 DIAGNOSIS — E1159 Type 2 diabetes mellitus with other circulatory complications: Secondary | ICD-10-CM | POA: Diagnosis not present

## 2015-09-25 DIAGNOSIS — Z794 Long term (current) use of insulin: Secondary | ICD-10-CM | POA: Diagnosis not present

## 2015-09-25 DIAGNOSIS — E1151 Type 2 diabetes mellitus with diabetic peripheral angiopathy without gangrene: Secondary | ICD-10-CM | POA: Diagnosis not present

## 2015-09-25 LAB — POCT GLYCOSYLATED HEMOGLOBIN (HGB A1C): HEMOGLOBIN A1C: 7.1

## 2015-09-25 NOTE — Patient Instructions (Signed)
Please continue: - Basaglar 20 units at bedtime - Glipizide XL 5 mg in am - Metformin 1000 mg 2x a day with meals.  Please return in 3 months with your sugar log.

## 2015-09-25 NOTE — Progress Notes (Signed)
Patient ID: [name], male   DOB: 15-Mar-1951, 65 y.o.   MRN: [id number]  HPI: [name] is a 65 y.o.-year-old male, returning for f/u for DM2, dx 2007, insulin-dependent since 2014, uncontrolled, with complications (CAD - s/p NSTEMI 11/2014 - stent; ED). Last visit 3 mo ago. PCP: Dr Odette Fraction.  Last hemoglobin A1c was: Lab Results  Component Value Date   HGBA1C 7.1 09/25/2015   HGBA1C 7.4* 06/19/2015   HGBA1C 7.2 03/20/2015   Pt is on a regimen of: - Metformin 1000 mg 2x a day - Glipizide XL 5 >> 10 >> 5 mg daily in am (decreased as he was having low CBGs in am) - Lantus (did not like Toujeo) 20 units at bedtime - started 07/2014  He was Levemir 140 units in am >> decreased to 70 units>> stopped as this was too expensive for him He stopped Welchol 3.7g at bedtime a day - at last  Stopped Farxiga 10 mg b/c frequent urination and urgency. He was on Glipizide in the past. He was on Metformin in the past >> no N/V.   Pt checks his sugars 2x a day and they are: - am: 49x1 (today), 89-130, 142 >> 90, 166-223, 257, 306 >> 90-163 >> 99-140 >> 120-128 >> 72, 98-156 >> 71-150, 207 - 2h after b'fast: 150s >> 102, 146, 171, 191 >> n/c >> 128, 134 >> n/c - before lunch: n/c >> 112-168 (mostly on the low limit) >> n/c >> 120, 224-245 >> 206 >> n/c >> 140s >> n/c - 2h after lunch: n/c >> 210 (corrected the 79 in am) >> n/c >> 144, 205 >> 120, 161 >> 115 - before dinner: n/c >> 143 >> n/c >> 134-225, 263 >> 118-205 >> 140s >> 140s >> n/c >> 104-130, 160, 200 - 2h after dinner: n/c >> 143, 175 >> n/c  - bedtime: n/c >> 105-165, 185 >> 99-170 >> 160s >> 151 >> 127-180, 200 - nighttime: n/c >> 134, 210 >> 99-190 >> 94, 190-305 >> 148-216 >> n/c No lows.Lowest sugar was 90 >> 71; ? has hypoglycemia awareness  Highest sugar was 360 >> 190 >> 305 >> 200s.  - + mild CKD, last BUN/creatinine:  Lab Results  Component Value Date   BUN 9 04/30/2015   CREATININE 0.81 04/30/2015  On Losartan. -  last set of lipids: Lab Results  Component Value Date   CHOL 164 06/19/2015   HDL 38.40* 06/19/2015   LDLCALC 114* 06/19/2015   LDLDIRECT 168.0 12/18/2012   TRIG 61.0 06/19/2015   CHOLHDL 4 06/19/2015  On Pravastatin >> increased to 40 mg qod. - last eye exam was in 12/17/2014. No DR.  - no numbness and tingling in his feet.  Pt was admitted for CP in 11/2014: NSTEMI >> CAD >> PTCA >> stent placed. He was in cardiac rehab  >> finished.  I reviewed pt's medications, allergies, PMH, social hx, family hx, and changes were documented in the history of present illness. Otherwise, unchanged from my initial visit note. He decreased Metoprolol dose to 1/2  B/c fatigue.  Started on [employer].  ROS: Constitutional: + weight loss, no fatigue, no subjective hyperthermia/hypothermia, +  nocturia Eyes: no blurry vision, no xerophthalmia ENT: no sore throat, no nodules palpated in throat, no dysphagia/odynophagia, no hoarseness Cardiovascular: no CP/SOB/palpitations/+ leg swelling Respiratory: no cough/SOB Gastrointestinal: no N/V/D/C Musculoskeletal: no muscle/ joint aches Skin: no rashes Neurological: no tremors/numbness/tingling/dizziness  PE: BP 120/80 mmHg  Pulse 82  Ht 5'  10" (1.778 m)  Wt 248 lb 12.8 oz (112.855 kg)  BMI 35.70 kg/m2  SpO2 96% Body mass index is 35.7 kg/(m^2). Wt Readings from Last 3 Encounters:  09/25/15 248 lb 12.8 oz (112.855 kg)  06/19/15 251 lb (113.853 kg)  05/19/15 258 lb 12.8 oz (117.391 kg)   Constitutional: overweight, in NAD Eyes: PERRLA, EOMI, no exophthalmos ENT: moist mucous membranes, no thyromegaly, no cervical lymphadenopathy Cardiovascular: irreg irreg R, No MRG, + RLE edema (h/o knee surgery) Respiratory: CTA B Gastrointestinal: abdomen soft, NT, ND, BS+ Musculoskeletal: no deformities, strength intact in all 4 Skin: moist, warm, no rashes Neurological: no tremor with outstretched hands, DTR normal in all 4  ASSESSMENT: 1. DM2,  insulin-dependent, uncontrolled, with complications - CAD, s/p NSTEMI, s/p stent 11/2014 - ED  PLAN:  1. Patient with long-standing, uncontrolled diabetes, with now better control.+ some fluctuations, 2/2 missed doses doses or dietary indiscretions. Will continue current regimen anddiscussed about improving diet and exercise. He did lose weight since last visit. - I suggested to:  Patient Instructions  Please continue: - Basaglar 20 units at bedtime - Glipizide XL 5 mg in am - Metformin 1000 mg 2x a day with meals.  Please return in 3 months with your sugar log.   - continue checking sugars at different times of the day - check 2 times a day, rotating checks - advised for yearly eye exams, he is up to date  - will check HbA1c today >> 7.1% (better!) - Return to clinic in 3 mo with sugar log

## 2015-10-02 ENCOUNTER — Encounter: Payer: PPO | Admitting: Physician Assistant

## 2015-10-02 NOTE — Progress Notes (Signed)
This encounter was created in error - please disregard.

## 2015-10-09 DIAGNOSIS — E119 Type 2 diabetes mellitus without complications: Secondary | ICD-10-CM | POA: Diagnosis not present

## 2015-10-09 DIAGNOSIS — Z0100 Encounter for examination of eyes and vision without abnormal findings: Secondary | ICD-10-CM | POA: Diagnosis not present

## 2015-10-09 LAB — HM DIABETES EYE EXAM

## 2015-10-27 ENCOUNTER — Encounter: Payer: Self-pay | Admitting: Physician Assistant

## 2015-10-27 ENCOUNTER — Ambulatory Visit (INDEPENDENT_AMBULATORY_CARE_PROVIDER_SITE_OTHER): Payer: Federal, State, Local not specified - PPO | Admitting: Physician Assistant

## 2015-10-27 VITALS — BP 148/84 | HR 105 | Ht 70.0 in | Wt 252.8 lb

## 2015-10-27 DIAGNOSIS — I10 Essential (primary) hypertension: Secondary | ICD-10-CM | POA: Diagnosis not present

## 2015-10-27 DIAGNOSIS — I471 Supraventricular tachycardia: Secondary | ICD-10-CM

## 2015-10-27 DIAGNOSIS — E785 Hyperlipidemia, unspecified: Secondary | ICD-10-CM | POA: Diagnosis not present

## 2015-10-27 DIAGNOSIS — I2510 Atherosclerotic heart disease of native coronary artery without angina pectoris: Secondary | ICD-10-CM

## 2015-10-27 MED ORDER — METOPROLOL TARTRATE 25 MG PO TABS: 25.0000 mg | ORAL_TABLET | Freq: Two times a day (BID) | ORAL | 3 refills | Status: DC

## 2015-10-27 NOTE — Progress Notes (Signed)
Cardiology Office Note    Date:  10/27/2015   ID:  Elyon, Zoll 1951-01-03, MRN [id number]  PCP:  [name], MD  Cardiologist: Dr. [name]  Chief Complaint  Patient presents with  . Follow-up    History of Present Illness:  [name] is a 65 y.o. male  with a history of NSTEMI: CAD s/p DES to LCx (11/2014), OM1 not stented due to inability of stent to track (likely due to wire tangle), HTN, HLD  He was seen in the office by Dr. Tamala Julian on 04/15/15 for follow up. He described episodes of belching. Dr. Tamala Julian prescribed him SL NTG to rule out being an anginal equivalent. The patient said his symptoms were relieved by the SL NTG and he was set up for outpatient cath on 01/26. Cath showed continued widely patent mid circumflex stent., 95% stenosis in the mid body of the first obtuse marginal, representing restenosis after angioplasty in August reduced the lesion to 50%, 90% ostial first diagonal and otherwise no change in anatomy compared to the prior post PCI angiogram. Dr Tamala Julian attempted and failed first obtuse marginal PTCA due to inability to cross the stenosis. Angulation in the vessel prevented torque ability and he wasl unable to solve lack of intra-coronary support. Medical therapy felt to be the only option. He was started on imdur and discharged the next day.  He last saw Nell Range, [place] in February 2017 at which time she decreased his metoprolol to 12.5 mg twice a day because of complaints of depression and fatigue. He was not taking his Imdur daily so she adjusted that.   Patient is here today for routine follow-up. He denies chest pain, palpitations, dizziness or presyncope. He has chronic dyspnea on exertion that he attributes to his weight. EKG today shows Initially looked like atrial fibrillation but is Normal sinus rhythm with PAT 105 bpm which is new for him. EKG strips reviewed with Dr. Rayann Heman and long telemetry strip done patient looks like it's normal  sinus rhythm with PAT. Chadvasc equals 4 for age, hypertension, diabetes and CAD. He is on Brilinta and is in the twilight study. Patient admits to drinking a lot of soda recently and had to drive to [place] and took Excedrin with caffeine in it.       Past Medical History:  Diagnosis Date  . Adenomatous colon polyp 2011  . Anemia   . Angina pectoris ([place]) 05/01/2015   med rx for 95% OM (u/a to access due to tortuosity), 95% D1 and other moderate CAD at cath  . Arthritis    "back, knees" (05/01/2015)  . Chest pain    pleuritic  . Chronic lower back pain   . Coronary artery disease   . Depression   . GERD (gastroesophageal reflux disease)   . HTN (hypertension)   . Hyperlipidemia   . OSA on CPAP   . Osteoarthritis   . Pneumonia ~ 2014 X 1  . Refusal of blood transfusions as patient is Jehovah's Witness   . Type II or unspecified type diabetes mellitus without mention of complication, not stated as uncontrolled     Past Surgical History:  Procedure Laterality Date  . CARDIAC CATHETERIZATION  2004   patent coronary arteries  . CARDIAC CATHETERIZATION  05/01/2015   "tried to put stent in but couldn't"  . CARDIAC CATHETERIZATION N/A 05/01/2015   Procedure: Left Heart Cath and Coronary Angiography;  Surgeon: Belva Crome, MD; LAD 60%, oD1  90%, pD1 70%, D2 70%, CFX patent stent, 30% distal to prev stent, pRCA 20%, OM1 90/95%, NL LV  . CARDIAC CATHETERIZATION N/A 05/01/2015   Procedure: Coronary Stent Intervention;  Surgeon: Belva Crome, MD;  Unsuccessful PCI OM due to tortuosity  . CARDIAC CATHETERIZATION N/A 11/06/2014   Procedure: Left Heart Cath and Coronary Angiography;  Surgeon: Belva Crome, MD;  Location: [facility];  Service: Cardiovascular;  Laterality: N/A;  . CLOSED REDUCTION SHOULDER DISLOCATION Right ~ 1975   "& reattached muscle"  . COLONOSCOPY W/ BIOPSIES AND POLYPECTOMY  X 2  . ESOPHAGOGASTRODUODENOSCOPY ENDOSCOPY    . PILONIDAL CYST EXCISION       Current Medications: Outpatient Medications Prior to Visit  Medication Sig Dispense Refill  . AMBULATORY NON FORMULARY MEDICATION Take 90 mg by mouth 2 (two) times daily. Medication Name: Brilinta 90 mg BID (TWILIGHT Study provided )    . AMBULATORY NON FORMULARY MEDICATION Take 81 mg by mouth daily. Medication Name: ASA 81 Mg daily or PLACEBO (TWILIGHT RESEARCH No open label asa, study provided)    . amLODipine (NORVASC) 10 MG tablet TAKE 1 TABLET (10 MG TOTAL) BY MOUTH DAILY. 180 tablet 1  . Blood Glucose Monitoring Suppl (RELION CONFIRM GLUCOSE MONITOR) W/DEVICE KIT 1 Device by Does not apply route once. 1 kit 0  . esomeprazole (NEXIUM) 40 MG capsule Take 1 capsule (40 mg total) by mouth daily at 12 noon. (Patient taking differently: Take 40 mg by mouth daily as needed. ) 30 capsule 3  . glipiZIDE (GLIPIZIDE XL) 5 MG 24 hr tablet Take 1 tablet (5 mg total) by mouth daily with breakfast. 90 tablet 2  . glucose blood (RELION ULTIMA TEST) test strip 1 each by Other route 2 (two) times daily. And lancets 2/day 100 each 12  . Insulin Glargine (BASAGLAR KWIKPEN) 100 UNIT/ML Solostar Pen Inject 20 Units into the skin daily at 10 pm. 15 mL 2  . Insulin Pen Needle (NOVOFINE PLUS) 32G X 4 MM MISC Use 1x a day 100 each 11  . isosorbide mononitrate (IMDUR) 30 MG 24 hr tablet Take 1 tablet (30 mg total) by mouth daily. 30 tablet 11  . latanoprost (XALATAN) 0.005 % ophthalmic solution Place 1 drop into both eyes at bedtime.  5  . losartan-hydrochlorothiazide (HYZAAR) 100-12.5 MG tablet Take 1 tablet by mouth daily. 30 tablet 11  . metFORMIN (GLUCOPHAGE) 500 MG tablet TAKE 2 TABLETS (1,000 MG TOTAL) BY MOUTH 2 (TWO) TIMES DAILY WITH A MEAL. 360 tablet 0  . metoprolol tartrate (LOPRESSOR) 25 MG tablet Take 0.5 tablets (12.5 mg total) by mouth 2 (two) times daily. 90 tablet 3  . nitroGLYCERIN (NITROSTAT) 0.4 MG SL tablet Place 1 tablet (0.4 mg total) under the tongue every 5 (five) minutes x 3 doses as  needed. (CHEST PAIN) 25 tablet 12  . pravastatin (PRAVACHOL) 20 MG tablet Take 1 tablet (20 mg total) by mouth daily. Start with 1 tablet 2 times a week and increased to daily as tolerated. (Patient taking differently: Take 40 mg by mouth daily. Start with 1 tablet 2 times a week and increased to daily as tolerated.) 30 tablet 11  . tiZANidine (ZANAFLEX) 4 MG tablet Take 4 mg by mouth every 8 (eight) hours as needed. (MUSCLE SPASMS)  2  . traZODone (DESYREL) 50 MG tablet Take 1 tablet (50 mg total) by mouth at bedtime as needed for sleep. 30 tablet 3   No facility-administered medications prior to visit.  Allergies:   Statins   Social History   Social History  . Marital status: Married    Spouse name: N/A  . Number of children: 4  . Years of education: N/A   Occupational History  . Retired Unemployed   Social History Main Topics  . Smoking status: Former Smoker    Packs/day: 0.50    Years: 10.00    Types: Cigarettes    Quit date: 04/06/1983  . Smokeless tobacco: Never Used  . Alcohol use 0.0 oz/week     Comment: `/26/2017 "might drink a beer q couple months mostly; summertime I might drink 2-3 beers/week"  . Drug use: No  . Sexual activity: Not Currently   Other Topics Concern  . None   Social History Narrative   Regular Exercise -  NO     Family History:  The patient's family history includes Alcohol abuse in his other; Colon cancer in his paternal uncle; Coronary artery disease in his mother and other; Diabetes in his maternal uncle, mother, paternal uncle, and sister; Heart disease in his mother; Stroke in his mother.   ROS:   Please see the history of present illness.    Review of Systems  Constitution: Negative.  HENT: Negative.   Cardiovascular: Negative.   Respiratory: Negative.   Endocrine: Negative.   Hematologic/Lymphatic: Negative.   Musculoskeletal: Positive for back pain and myalgias.  Gastrointestinal: Negative.   Genitourinary: Negative.    Neurological: Negative.   Psychiatric/Behavioral: Positive for depression. The patient is nervous/anxious.    All other systems reviewed and are negative.   PHYSICAL EXAM:   VS:  BP (!) 148/84 (BP Location: Left Arm, Patient Position: Sitting, Cuff Size: Normal)   Pulse (!) 105   Ht 5' 10"  (1.778 m)   Wt 252 lb 12.8 oz (114.7 kg)   BMI 36.27 kg/m   Physical Exam  GEN: Well nourished, well developed, in no acute distress  Neck: no JVD, carotid bruits, or masses Cardiac:RRR; no murmurs, rubs, or gallops  Respiratory:  clear to auscultation bilaterally, normal work of breathing GI: soft, nontender, nondistended, + BS Ext: without cyanosis, clubbing, or edema, Good distal pulses bilaterally MS: no deformity or atrophy  Skin: warm and dry, no rash Neuro:  Alert and Oriented x 3, Strength and sensation are intact Psych: euthymic mood, full affect  Wt Readings from Last 3 Encounters:  10/27/15 252 lb 12.8 oz (114.7 kg)  09/25/15 248 lb 12.8 oz (112.9 kg)  06/19/15 251 lb (113.9 kg)      Studies/Labs Reviewed:   EKG:  EKG is  ordered today.  The ekg ordered today demonstrates Atrial fibrillation at 105 bpm  Recent Labs: 12/20/2014: Pro B Natriuretic peptide (BNP) 25.0 04/30/2015: BUN 9; Creat 0.81; Hemoglobin 13.5; Platelets 251; Potassium 3.7; Sodium 138 06/19/2015: ALT 17   Lipid Panel    Component Value Date/Time   CHOL 164 06/19/2015 1624   TRIG 61.0 06/19/2015 1624   HDL 38.40 (L) 06/19/2015 1624   CHOLHDL 4 06/19/2015 1624   VLDL 12.2 06/19/2015 1624   LDLCALC 114 (H) 06/19/2015 1624   LDLDIRECT 168.0 12/18/2012 1340    Additional studies/ records that were reviewed today include:   Cardiac Cath: 05/01/2015 1. 1st Diag lesion, 70% stenosed. 2. Ost 1st Diag to 1st Diag lesion, 70% stenosed. 3. 2nd Diag lesion, 75% stenosed. 4. Mid LAD to Dist LAD lesion, 60% stenosed. 5. Prox RCA to Dist RCA lesion, 25% stenosed. 6. Dist Cx lesion, 30% stenosed. 7.  Ost 1st  Diag lesion, 90% stenosed. 8. 1st Mrg-1 lesion, 95% stenosed. 9. 1st Mrg-2 lesion, 95% stenosed. Post intervention, there is a 95% residual stenosis. The lesion was previously treated with angioplasty.   Continued widely patent mid circumflex stent.  95% stenosis in the mid body of the first obtuse marginal, representing restenosis after angioplasty in August reduced the lesion to 50%.  90% ostial first diagonal  Otherwise no change in anatomy compared to the prior post PCI angiogram  Normal LV function  Failed first obtuse marginal PTCA due to inability to cross the stenosis. Angulation in the vessel prevented torque ability and we will unable to solve lack of intra-coronary support. Recommendation:    Continue medical therapy  Monitor overnight and discharge in a.m.  No change in medical regimen  Resume phase II cardiac rehabilitation        ASSESSMENT:    1. CAD in native artery   2. Essential hypertension   3. Hyperlipidemia   4. Atrial fibrillation, unspecified type ([place])   5. PAT (paroxysmal atrial tachycardia) (HCC)      PLAN:  In order of problems listed above:  CAD with most recent cath in January 2017 as listed above. Medical therapy recommended. No further angina  Hypertension blood pressure is stable  Hyperlipidemia on Pravachol  PAT with rate of 105 new for patient , asymptomatic, will increase metoprolol to 25 mg the morning 12.5 mg in the afternoon for better rate control . Decrease caffeine intake. 48 hour monitor to rule out atrial fibrillation.      Medication Adjustments/Labs and Tests Ordered: Current medicines are reviewed at length with the patient today.  Concerns regarding medicines are outlined above.  Medication changes, Labs and Tests ordered today are listed in the Patient Instructions below. Patient Instructions  Medication Instructions:      If you need a refill on your cardiac medications before your next appointment, please  call your pharmacy.  Labwork:   Testing/Procedures:   Follow-Up:   Any Other Special Instructions Will Be Listed Below (If Applicable).                                                                                                                                                      Signed, Ermalinda Barrios, PA-C  10/27/2015 3:02 PM    [facility] [address], [place], [place]  [address] Phone: [phone]; Fax: [phone]

## 2015-10-27 NOTE — Patient Instructions (Addendum)
Medication Instructions:   START TAKING METOPROLOL 25 MG IN AM AND 12.5 MG IN PM    If you need a refill on your cardiac medications before your next appointment, please call your pharmacy.  Labwork: NONE ORDER TODAY    Testing/Procedures: 33 HOUR Your physician has recommended that you wear a holter monitor. Holter monitors are medical devices that record the heart's electrical activity. Doctors most often use these monitors to diagnose arrhythmias. Arrhythmias are problems with the speed or rhythm of the heartbeat. The monitor is a small, portable device. You can wear one while you do your normal daily activities. This is usually used to diagnose what is causing palpitations/syncope (passing out).     Follow-Up:  IN 2 TO 3  MONTHS WITH DR Tamala Julian    Any Other Special Instructions Will Be Listed Below (If Applicable).

## 2015-10-28 ENCOUNTER — Ambulatory Visit (INDEPENDENT_AMBULATORY_CARE_PROVIDER_SITE_OTHER): Payer: Federal, State, Local not specified - PPO

## 2015-10-28 DIAGNOSIS — I471 Supraventricular tachycardia: Secondary | ICD-10-CM | POA: Diagnosis not present

## 2015-11-07 ENCOUNTER — Encounter: Payer: Self-pay | Admitting: Physician Assistant

## 2015-11-07 NOTE — Telephone Encounter (Signed)
I reviewed with Dr Johnsie Cancel, he recommended pt to come office today for further evaluation. Pt offered appt today at 3PM, pt declined appt today, states it's not that serious.   Pt asked for appt first of next week. Pt scheduled to see Truitt Merle, NP 11/11/15 10AM. Pt advised to use NTG if any more belching, chest pain/tightness, if not relieved with NTG seek medical care at [facility] ED.

## 2015-11-07 NOTE — Telephone Encounter (Signed)
Pt states when he tried to take a deep breath yesterday he developed a tightness in his chest. Pt states it seemed to last most of the day, pt states he took [employer], advertising followed by belching. Pt states he took 2 NTG with relief of belching and tightness in his chest. Pt states this is the only time he had tightness in his chest in several months, pt denies any other symptoms, pt denies chest tightness today.

## 2015-11-11 ENCOUNTER — Encounter: Payer: Self-pay | Admitting: Nurse Practitioner

## 2015-11-11 ENCOUNTER — Ambulatory Visit (INDEPENDENT_AMBULATORY_CARE_PROVIDER_SITE_OTHER): Payer: Federal, State, Local not specified - PPO | Admitting: Nurse Practitioner

## 2015-11-11 VITALS — BP 140/80 | HR 62 | Ht 70.0 in | Wt 252.0 lb

## 2015-11-11 DIAGNOSIS — I2510 Atherosclerotic heart disease of native coronary artery without angina pectoris: Secondary | ICD-10-CM

## 2015-11-11 DIAGNOSIS — R0789 Other chest pain: Secondary | ICD-10-CM

## 2015-11-11 DIAGNOSIS — I480 Paroxysmal atrial fibrillation: Secondary | ICD-10-CM | POA: Diagnosis not present

## 2015-11-11 DIAGNOSIS — E785 Hyperlipidemia, unspecified: Secondary | ICD-10-CM | POA: Diagnosis not present

## 2015-11-11 DIAGNOSIS — I10 Essential (primary) hypertension: Secondary | ICD-10-CM

## 2015-11-11 LAB — BASIC METABOLIC PANEL
BUN: 18 mg/dL (ref 7–25)
CO2: 27 mmol/L (ref 20–31)
Calcium: 8.9 mg/dL (ref 8.6–10.3)
Chloride: 108 mmol/L (ref 98–110)
Creat: 1.04 mg/dL (ref 0.70–1.25)
Glucose, Bld: 122 mg/dL — ABNORMAL HIGH (ref 65–99)
Potassium: 4.3 mmol/L (ref 3.5–5.3)
Sodium: 142 mmol/L (ref 135–146)

## 2015-11-11 LAB — CBC
HCT: 42.2 % (ref 38.5–50.0)
Hemoglobin: 13.6 g/dL (ref 13.2–17.1)
MCH: 28.2 pg (ref 27.0–33.0)
MCHC: 32.2 g/dL (ref 32.0–36.0)
MCV: 87.4 fL (ref 80.0–100.0)
MPV: 10.7 fL (ref 7.5–12.5)
Platelets: 240 10*3/uL (ref 140–400)
RBC: 4.83 MIL/uL (ref 4.20–5.80)
RDW: 14.5 % (ref 11.0–15.0)
WBC: 8 10*3/uL (ref 3.8–10.8)

## 2015-11-11 LAB — TSH: TSH: 1.52 mIU/L (ref 0.40–4.50)

## 2015-11-11 MED ORDER — METOPROLOL TARTRATE 25 MG PO TABS: 12.5000 mg | ORAL_TABLET | Freq: Two times a day (BID) | ORAL | 3 refills | Status: DC

## 2015-11-11 NOTE — Progress Notes (Signed)
CARDIOLOGY OFFICE NOTE  Date:  11/11/2015    [name] Date of Birth: [birth year] Medical Record #[id number]  PCP:  [name], MD  Cardiologist:  Tamala Julian   Chief Complaint  Patient presents with  . Chest Pain  . Coronary Artery Disease  . Atrial Fibrillation    Work in visit - seen for Dr. Tamala Julian    History of Present Illness: [name] is a 65 y.o. male who presents today for a work in visit. Seen for Dr. Tamala Julian.   He has a history of NSTEMI: CAD s/p DES to LCx (11/2014), OM1 not stented due to inability of stent to track (likely due to wire tangle), HTN, & HLD.    He was seen in the office by Dr. Tamala Julian on 04/15/15 for follow up. He described episodes of belching. Dr. Tamala Julian prescribed him SL NTG to rule out being an anginal equivalent. The patient said his symptoms were relieved by the SL NTG and he was set up for outpatient cath on 01/26. Cath showed continued widely patent mid circumflex stent., 95% stenosis in the mid body of the first obtuse marginal, representing restenosis after angioplasty in August reduced the lesion to 50%, 90% ostial first diagonal and otherwise no change in anatomy compared to the prior post PCI angiogram. Dr Tamala Julian attempted and failed first obtuse marginal PTCA due to inability to cross the stenosis. Angulation in the vessel prevented torque ability and he was unable to solve lack of intra-coronary support. Medical therapy felt to be the only option. He was started on Imdur and discharged the next day.  He last saw Nell Range, [place] in February 2017 at which time she decreased his metoprolol to 12.5 mg twice a day because of complaints of depression and fatigue. He was not taking his Imdur daily so she adjusted that.  Seen about 2 weeks ago - EKG reviewed with Dr. Rayann Heman - felt to show PAT - had a Holter placed - this was reviewed by Dr. Tamala Julian who noted PAF. Was asked to come in to discuss anticoagulation.   Email sent last week -  complaining of little shortness of breath and chest tightness. Took 2 NTG - refused OV that day - thus added to my schedule.   Comes in today. Here alone. Says he is here to make sure he "is ok". Pretty vague in his symptoms and hard to extrapolate. One spell of his angina - has not come back. He is real fatigued - says this was present even before his last visit when it was increased. Says this has been for about a week. No palpitations. No exercise. No smoking. No one has called him about his monitor results - not aware of any AF. He remains in the TWILIGHT study with Brilinta. Says he has been dizzy and lightheaded. No frank syncope - difficult to discern how long this has been going on. Not really able to tell me how his blood sugars are running.   Past Medical History:  Diagnosis Date  . Adenomatous colon polyp 2011  . Anemia   . Angina pectoris ([place]) 05/01/2015   med rx for 95% OM (u/a to access due to tortuosity), 95% D1 and other moderate CAD at cath  . Arthritis    "back, knees" (05/01/2015)  . Chest pain    pleuritic  . Chronic lower back pain   . Coronary artery disease   . Depression   . GERD (gastroesophageal reflux disease)   .  HTN (hypertension)   . Hyperlipidemia   . OSA on CPAP   . Osteoarthritis   . Pneumonia ~ 2014 X 1  . Refusal of blood transfusions as patient is Jehovah's Witness   . Type II or unspecified type diabetes mellitus without mention of complication, not stated as uncontrolled     Past Surgical History:  Procedure Laterality Date  . CARDIAC CATHETERIZATION  2004   patent coronary arteries  . CARDIAC CATHETERIZATION  05/01/2015   "tried to put stent in but couldn't"  . CARDIAC CATHETERIZATION N/A 05/01/2015   Procedure: Left Heart Cath and Coronary Angiography;  Surgeon: Belva Crome, MD; LAD 60%, oD1 90%, pD1 70%, D2 70%, CFX patent stent, 30% distal to prev stent, pRCA 20%, OM1 90/95%, NL LV  . CARDIAC CATHETERIZATION N/A 05/01/2015   Procedure:  Coronary Stent Intervention;  Surgeon: Belva Crome, MD;  Unsuccessful PCI OM due to tortuosity  . CARDIAC CATHETERIZATION N/A 11/06/2014   Procedure: Left Heart Cath and Coronary Angiography;  Surgeon: Belva Crome, MD;  Location: [facility];  Service: Cardiovascular;  Laterality: N/A;  . CLOSED REDUCTION SHOULDER DISLOCATION Right ~ 1975   "& reattached muscle"  . COLONOSCOPY W/ BIOPSIES AND POLYPECTOMY  X 2  . ESOPHAGOGASTRODUODENOSCOPY ENDOSCOPY    . PILONIDAL CYST EXCISION       Medications: Current Outpatient Prescriptions  Medication Sig Dispense Refill  . AMBULATORY NON FORMULARY MEDICATION Take 90 mg by mouth 2 (two) times daily. Medication Name: Brilinta 90 mg BID (TWILIGHT Study provided )    . AMBULATORY NON FORMULARY MEDICATION Take 81 mg by mouth daily. Medication Name: ASA 81 Mg daily or PLACEBO (TWILIGHT RESEARCH No open label asa, study provided)    . amLODipine (NORVASC) 10 MG tablet TAKE 1 TABLET (10 MG TOTAL) BY MOUTH DAILY. 180 tablet 1  . Blood Glucose Monitoring Suppl (RELION CONFIRM GLUCOSE MONITOR) W/DEVICE KIT 1 Device by Does not apply route once. 1 kit 0  . esomeprazole (NEXIUM) 40 MG capsule Take 1 capsule (40 mg total) by mouth daily at 12 noon. (Patient taking differently: Take 40 mg by mouth daily as needed. ) 30 capsule 3  . glipiZIDE (GLIPIZIDE XL) 5 MG 24 hr tablet Take 1 tablet (5 mg total) by mouth daily with breakfast. 90 tablet 2  . glucose blood (RELION ULTIMA TEST) test strip 1 each by Other route 2 (two) times daily. And lancets 2/day 100 each 12  . Insulin Glargine (BASAGLAR KWIKPEN) 100 UNIT/ML Solostar Pen Inject 20 Units into the skin daily at 10 pm. 15 mL 2  . Insulin Pen Needle (NOVOFINE PLUS) 32G X 4 MM MISC Use 1x a day 100 each 11  . isosorbide mononitrate (IMDUR) 30 MG 24 hr tablet Take 1 tablet (30 mg total) by mouth daily. 30 tablet 11  . latanoprost (XALATAN) 0.005 % ophthalmic solution Place 1 drop into both eyes at bedtime.  5    . losartan-hydrochlorothiazide (HYZAAR) 100-12.5 MG tablet Take 1 tablet by mouth daily. 30 tablet 11  . metFORMIN (GLUCOPHAGE) 500 MG tablet TAKE 2 TABLETS (1,000 MG TOTAL) BY MOUTH 2 (TWO) TIMES DAILY WITH A MEAL. 360 tablet 0  . metoprolol tartrate (LOPRESSOR) 25 MG tablet Take 1 tablet (25 mg total) by mouth 2 (two) times daily. TAKE  25 MG IN AM  TAKE 12.5 MG IN PM 60 tablet 3  . nitroGLYCERIN (NITROSTAT) 0.4 MG SL tablet Place 1 tablet (0.4 mg total) under the tongue  every 5 (five) minutes x 3 doses as needed. (CHEST PAIN) 25 tablet 12  . pravastatin (PRAVACHOL) 20 MG tablet Take 1 tablet (20 mg total) by mouth daily. Start with 1 tablet 2 times a week and increased to daily as tolerated. (Patient taking differently: Take 40 mg by mouth daily. Start with 1 tablet 2 times a week and increased to daily as tolerated.) 30 tablet 11  . tiZANidine (ZANAFLEX) 4 MG tablet Take 4 mg by mouth every 8 (eight) hours as needed. (MUSCLE SPASMS)  2  . traZODone (DESYREL) 50 MG tablet Take 1 tablet (50 mg total) by mouth at bedtime as needed for sleep. 30 tablet 3   No current facility-administered medications for this visit.     Allergies: Allergies  Allergen Reactions  . Statins Other (See Comments)    REACTION: joint pain Lipitor- headaches Has also tried Livalo, pravachol, zetia, crestor, welchol    Social History: The patient  reports that he quit smoking about 32 years ago. His smoking use included Cigarettes. He has a 5.00 pack-year smoking history. He has never used smokeless tobacco. He reports that he drinks alcohol. He reports that he does not use drugs.   Family History: The patient's family history includes Alcohol abuse in his other; Colon cancer in his paternal uncle; Coronary artery disease in his mother and other; Diabetes in his maternal uncle, mother, paternal uncle, and sister; Heart disease in his mother; Stroke in his mother.   Review of Systems: Please see the history of  present illness.   Otherwise, the review of systems is positive for none.   All other systems are reviewed and negative.   Physical Exam: VS:  BP 140/80 (BP Location: Right Arm, Patient Position: Sitting)   Pulse 62   Ht _0  (1.778 m)   Wt 252 lb (114.3 kg)   BMI 36.16 kg/m  .  BMI Body mass index is 36.16 kg/m.  Wt Readings from Last 3 Encounters:  11/11/15 252 lb (114.3 kg)  10/27/15 252 lb 12.8 oz (114.7 kg)  09/25/15 248 lb 12.8 oz (112.9 kg)    General: Pleasant. Obese black male who is alert and in no acute distress.   HEENT: Normal.  Neck: Supple, no JVD, carotid bruits, or masses noted.  Cardiac: Irregular rhythm. Rate is ok. No edema.  Respiratory:  Lungs are clear to auscultation bilaterally with normal work of breathing.  GI: Soft and nontender. Obese MS: No deformity or atrophy. Gait and ROM intact.  Skin: Warm and dry. Color is normal.  Neuro:  Strength and sensation are intact and no gross focal deficits noted.  Psych: Alert, appropriate and with normal affect.   LABORATORY DATA:  EKG:  EKG is ordered today. This demonstrates HR of 62. Looks like [place]. Reviewed with Dr. Tamala Julian.   Lab Results  Component Value Date   WBC 6.0 04/30/2015   HGB 13.5 04/30/2015   HCT 41.7 04/30/2015   PLT 251 04/30/2015   GLUCOSE 196 (H) 04/30/2015   CHOL 164 06/19/2015   TRIG 61.0 06/19/2015   HDL 38.40 (L) 06/19/2015   LDLDIRECT 168.0 12/18/2012   LDLCALC 114 (H) 06/19/2015   ALT 17 06/19/2015   AST 16 06/19/2015   NA 138 04/30/2015   K 3.7 04/30/2015   CL 102 04/30/2015   CREATININE 0.81 04/30/2015   BUN 9 04/30/2015   CO2 26 04/30/2015   TSH 1.36 12/18/2012   PSA 3.53 04/19/2013   INR 0.98 04/30/2015  HGBA1C 7.1 09/25/2015   MICROALBUR 0.7 09/29/2011    BNP (last 3 results) No results for input(s): BNP in the last 8760 hours.  ProBNP (last 3 results)  Recent Labs  12/20/14 1412  PROBNP 25.0     Other Studies Reviewed Today: Cardiac Cath:  05/01/2015 1. 1st Diag lesion, 70% stenosed. 2. Ost 1st Diag to 1st Diag lesion, 70% stenosed. 3. 2nd Diag lesion, 75% stenosed. 4. Mid LAD to Dist LAD lesion, 60% stenosed. 5. Prox RCA to Dist RCA lesion, 25% stenosed. 6. Dist Cx lesion, 30% stenosed. 7. Ost 1st Diag lesion, 90% stenosed. 8. 1st Mrg-1 lesion, 95% stenosed. 9. 1st Mrg-2 lesion, 95% stenosed. Post intervention, there is a 95% residual stenosis. The lesion was previously treated with angioplasty.  Continued widely patent mid circumflex stent.  95% stenosis in the mid body of the first obtuse marginal, representing restenosis after angioplasty in August reduced the lesion to 50%.  90% ostial first diagonal  Otherwise no change in anatomy compared to the prior post PCI angiogram  Normal LV function  Failed first obtuse marginal PTCA due to inability to cross the stenosis. Angulation in the vessel prevented torque ability and we will unable to solve lack of intra-coronary support. Recommendation:  Continue medical therapy  Monitor overnight and discharge in a.m.  No change in medical regimen  Resume phase II cardiac rehabilitation   Result Notes from Holter 10/2015  Study Highlights     Basic underlying rhythm is NSR with PAC's  Frequent PAC's are noted.  Frequent bouts of atrial tach at >170 bpm for 2-5 beats with blocked conduction.  Average HR 75 bpm with range 46 to 135 bpm  Brief bouts of sustained tachycardia, possibly atrial fib at > 150 bpm.  No diary   Brief bouts of atrial fib. Predominant sinus rhythm. Occasional sinus bradycardia. Rare PVC's.     Notes Recorded by Belva Crome, MD on 11/07/2015 at 5:37 PM EDT Let the patient know he is having brief bouts of atrial fibrillation and needs to come in to discuss anticoagulation and recent symptoms. A copy will be sent to [name], MD    EchoStudy Conclusions from 07/2013  - Left ventricle: The cavity size was normal.  There was mild concentric hypertrophy. Systolic function was normal. The estimated ejection fraction was in the range of 55% to 60%. Wall motion was normal; there were no regional wall motion abnormalities. Doppler parameters are consistent with abnormal left ventricular relaxation (grade 1 diastolic dysfunction). - Aortic valve: Mild regurgitation. - Mitral valve: Calcified annulus. - Left atrium: The atrium was moderately dilated.  Assessment/Plan: 1. Lone spell of chest pain/dyspnea with known CAD with most recent cath in January 2017 as listed above. Medical therapy recommended as only option due to failed PCI attempt to the OM. His last PCI was from August of 2016. He is in the TWILIGHT study. Discussed with Dr. Tamala Julian - would be ok to stop this and start [place] if indicated.   2. Hypertension - BP just fair.   3. Hyperlipidemia on Pravachol  4. Prior episode of PAT with rate of 105 new for patient - has had Holter showing PAF/PAT. Now with probable Wenkebach today. On higher doses of beta blocker. Not on any anticoagulation. He remains in the TWILIGHT study. Reviewed with Dr. Tamala Julian - Dr. Tamala Julian would like to send to EP for evaluation of possible AF and tachy/brady. It would be ok to stop his Brilinta and start [place]  if indicated. We are cutting Metoprolol back today. Lab today.   Current medicines are reviewed with the patient today.  The patient does not have concerns regarding medicines other than what has been noted above.  The following changes have been made:  See above.  Labs/ tests ordered today include:    Orders Placed This Encounter  Procedures  . Basic metabolic panel  . CBC  . TSH  . EKG 12-Lead     Disposition:   FU with EP for referral. Lab today. See Dr. Tamala Julian back as planned.   Patient is agreeable to this plan and will call if any problems develop in the interim.   Signed: Burtis Junes, RN, ANP-C 11/11/2015 10:33 AM  [facility] [address] [place], [place]  [address] Phone: [phone] Fax: [phone]

## 2015-11-11 NOTE — Patient Instructions (Addendum)
We will be checking the following labs today - BMET, CBC, TSH   Medication Instructions:    Continue with your current medicines. BUT  I am cutting the Metoprolol back to just 12.5 mg twice a day    Testing/Procedures To Be Arranged:  N/A  Follow-Up:   EP referral for possible AFib/tachy/brady    Other Special Instructions:   N/A    If you need a refill on your cardiac medications before your next appointment, please call your pharmacy.   Call the [facility] office at [phone] if you have any questions, problems or concerns.

## 2015-11-17 ENCOUNTER — Encounter: Payer: Self-pay | Admitting: Nurse Practitioner

## 2015-12-15 ENCOUNTER — Ambulatory Visit (INDEPENDENT_AMBULATORY_CARE_PROVIDER_SITE_OTHER): Payer: Federal, State, Local not specified - PPO | Admitting: Internal Medicine

## 2015-12-15 ENCOUNTER — Encounter: Payer: Self-pay | Admitting: Internal Medicine

## 2015-12-15 VITALS — BP 160/84 | HR 80 | Ht 70.0 in | Wt 254.8 lb

## 2015-12-15 DIAGNOSIS — I119 Hypertensive heart disease without heart failure: Secondary | ICD-10-CM | POA: Diagnosis not present

## 2015-12-15 DIAGNOSIS — I2510 Atherosclerotic heart disease of native coronary artery without angina pectoris: Secondary | ICD-10-CM | POA: Diagnosis not present

## 2015-12-15 DIAGNOSIS — I480 Paroxysmal atrial fibrillation: Secondary | ICD-10-CM

## 2015-12-15 DIAGNOSIS — G4733 Obstructive sleep apnea (adult) (pediatric): Secondary | ICD-10-CM

## 2015-12-15 NOTE — Progress Notes (Signed)
Electrophysiology Office Note   Date:  12/15/2015   ID:  [name], [name] 22-Aug-1950, MRN [id number]  PCP:  [name], MD  Cardiologist:  Dr Tamala Julian Primary Electrophysiologist: Thompson Grayer, MD    Chief Complaint  Patient presents with  . Atrial Fibrillation     History of Present Illness: [name] is a 65 y.o. male who presents today for electrophysiology evaluation.   He has a h/o CAD s/p PCI with preserved EF.  He has OSA but does not use his CPAP.  He has occasional fatigue but states that he has felt "pretty good here lately.".  He says that when he is better rested that he feels better.  He finds that that metoprolol and cholesterol medicines make him fatigued.  His BP is frequently elevated.  He reports that when he was doing cardiac rehab and exercise that his BP was better.  He is currently not exercising and has gained weight.  His BP has increased. He had a monitor placed 10/28/15.  This revealed predominantly sinus rhythm.  He did have salvos of atach as well as short afib.  In retrospect, he did not have symptoms at that time.  He has a smart watch and now tracks his heart rate with this.  He finds that his resting heart rate is mostly 60s.  With activity, his heart rate is mostly 70s.    Today, he denies symptoms of palpitations, chest pain, shortness of breath, orthopnea, PND, lower extremity edema, claudication, dizziness, presyncope, syncope, bleeding, or neurologic sequela. The patient is tolerating medications without difficulties and is otherwise without complaint today.    Past Medical History:  Diagnosis Date  . Adenomatous colon polyp 2011  . Anemia   . Angina pectoris ([place] [facility]) 05/01/2015   med rx for 95% OM (u/a to access due to tortuosity), 95% D1 and other moderate CAD at cath  . Arthritis    "back, knees" (05/01/2015)  . Chest pain    pleuritic  . Chronic lower back pain   . Coronary artery disease   . Depression   . GERD (gastroesophageal  reflux disease)   . HTN (hypertension)   . Hyperlipidemia   . Obstructive sleep apnea    noncompliant with CPAP  . Osteoarthritis   . Pneumonia ~ 2014 X 1  . Refusal of blood transfusions as patient is Jehovah's Witness   . Type II or unspecified type diabetes mellitus without mention of complication, not stated as uncontrolled    Past Surgical History:  Procedure Laterality Date  . CARDIAC CATHETERIZATION  2004   patent coronary arteries  . CARDIAC CATHETERIZATION  05/01/2015   "tried to put stent in but couldn't"  . CARDIAC CATHETERIZATION N/A 05/01/2015   Procedure: Left Heart Cath and Coronary Angiography;  Surgeon: Belva Crome, MD; LAD 60%, oD1 90%, pD1 70%, D2 70%, CFX patent stent, 30% distal to prev stent, pRCA 20%, OM1 90/95%, NL LV  . CARDIAC CATHETERIZATION N/A 05/01/2015   Procedure: Coronary Stent Intervention;  Surgeon: Belva Crome, MD;  Unsuccessful PCI OM due to tortuosity  . CARDIAC CATHETERIZATION N/A 11/06/2014   Procedure: Left Heart Cath and Coronary Angiography;  Surgeon: Belva Crome, MD;  Location: [facility];  Service: Cardiovascular;  Laterality: N/A;  . CLOSED REDUCTION SHOULDER DISLOCATION Right ~ 1975   "& reattached muscle"  . COLONOSCOPY W/ BIOPSIES AND POLYPECTOMY  X 2  . ESOPHAGOGASTRODUODENOSCOPY ENDOSCOPY    . PILONIDAL CYST  EXCISION       Current Outpatient Prescriptions  Medication Sig Dispense Refill  . AMBULATORY NON FORMULARY MEDICATION Take 90 mg by mouth 2 (two) times daily. Medication Name: Brilinta 90 mg BID (TWILIGHT Study provided )    . AMBULATORY NON FORMULARY MEDICATION Take 81 mg by mouth daily. Medication Name: ASA 81 Mg daily or PLACEBO (TWILIGHT RESEARCH No open label asa, study provided)    . amLODipine (NORVASC) 10 MG tablet TAKE 1 TABLET (10 MG TOTAL) BY MOUTH DAILY. 180 tablet 1  . Blood Glucose Monitoring Suppl (RELION CONFIRM GLUCOSE MONITOR) W/DEVICE KIT 1 Device by Does not apply route once. 1 kit 0  .  esomeprazole (NEXIUM) 40 MG capsule Take 40 mg by mouth daily as needed (heartburn or acid reflux).    [place] glipiZIDE (GLIPIZIDE XL) 5 MG 24 hr tablet Take 1 tablet (5 mg total) by mouth daily with breakfast. 90 tablet 2  . glucose blood (RELION ULTIMA TEST) test strip 1 each by Other route 2 (two) times daily. And lancets 2/day 100 each 12  . Insulin Glargine (BASAGLAR KWIKPEN) 100 UNIT/ML Solostar Pen Inject 20 Units into the skin daily at 10 pm. 15 mL 2  . Insulin Pen Needle (NOVOFINE PLUS) 32G X 4 MM MISC Use 1x a day 100 each 11  . isosorbide mononitrate (IMDUR) 30 MG 24 hr tablet Take 1 tablet (30 mg total) by mouth daily. 30 tablet 11  . latanoprost (XALATAN) 0.005 % ophthalmic solution Place 1 drop into both eyes at bedtime.  5  . losartan-hydrochlorothiazide (HYZAAR) 100-12.5 MG tablet Take 1 tablet by mouth daily. 30 tablet 11  . metFORMIN (GLUCOPHAGE) 500 MG tablet TAKE 2 TABLETS (1,000 MG TOTAL) BY MOUTH 2 (TWO) TIMES DAILY WITH A MEAL. 360 tablet 0  . metoprolol tartrate (LOPRESSOR) 25 MG tablet Take 0.5 tablets (12.5 mg total) by mouth 2 (two) times daily. TAKE  25 MG IN AM  TAKE 12.5 MG IN PM 60 tablet 3  . nitroGLYCERIN (NITROSTAT) 0.4 MG SL tablet Place 1 tablet (0.4 mg total) under the tongue every 5 (five) minutes x 3 doses as needed. (CHEST PAIN) 25 tablet 12  . pravastatin (PRAVACHOL) 20 MG tablet Take 1 tablet (20 mg total) by mouth daily. Start with 1 tablet 2 times a week and increased to daily as tolerated. 30 tablet 11  . tiZANidine (ZANAFLEX) 4 MG tablet Take 4 mg by mouth every 8 (eight) hours as needed. (MUSCLE SPASMS)  2  . traZODone (DESYREL) 50 MG tablet Take 1 tablet (50 mg total) by mouth at bedtime as needed for sleep. 30 tablet 3   No current facility-administered medications for this visit.     Allergies:   Statins   Social History:  The patient  reports that he quit smoking about 32 years ago. His smoking use included Cigarettes. He has a 5.00 pack-year smoking  history. He has never used smokeless tobacco. He reports that he drinks alcohol. He reports that he does not use drugs.   Family History:  The patient's  family history includes Alcohol abuse in his other; Colon cancer in his paternal uncle; Coronary artery disease in his mother and other; Diabetes in his maternal uncle, mother, paternal uncle, and sister; Heart disease in his mother; Stroke in his mother.    ROS:  Please see the history of present illness.   All other systems are reviewed and negative.    PHYSICAL EXAM: VS:  BP (!) 160/84  Pulse 80   Ht 5' 10"  (1.778 m)   Wt 254 lb 12.8 oz (115.6 kg)   BMI 36.56 kg/m  , BMI Body mass index is 36.56 kg/m. GEN: overweight, in no acute distress  HEENT: normal  Neck: no JVD, carotid bruits, or masses Cardiac: RRR; no murmurs, rubs, or gallops,no edema  Respiratory:  clear to auscultation bilaterally, normal work of breathing GI: soft, nontender, nondistended, + BS MS: no deformity or atrophy  Skin: warm and dry  Neuro:  Strength and sensation are intact Psych: euthymic mood, full affect  EKG:  EKG is ordered today. The ekg ordered today shows sinus rhythm 80 bpm with PACs, PR 216 msec,  On rhythm strip, a short run of atach is also noted   Recent Labs: 12/20/2014: Pro B Natriuretic peptide (BNP) 25.0 06/19/2015: ALT 17 11/11/2015: BUN 18; Creat 1.04; Hemoglobin 13.6; Platelets 240; Potassium 4.3; Sodium 142; TSH 1.52    Lipid Panel     Component Value Date/Time   CHOL 164 06/19/2015 1624   TRIG 61.0 06/19/2015 1624   HDL 38.40 (L) 06/19/2015 1624   CHOLHDL 4 06/19/2015 1624   VLDL 12.2 06/19/2015 1624   LDLCALC 114 (H) 06/19/2015 1624   LDLDIRECT 168.0 12/18/2012 1340     Wt Readings from Last 3 Encounters:  12/15/15 254 lb 12.8 oz (115.6 kg)  11/11/15 252 lb (114.3 kg)  10/27/15 252 lb 12.8 oz (114.7 kg)      Other studies Reviewed: Additional studies/ records that were reviewed today include: Dr Darliss Ridgel notes,  Eulis Foster notes, recent holter, echo  Review of the above records today demonstrates: as above   ASSESSMENT AND PLAN:  1.  Atach/ afib The patient has occasional episodes of atach (on rhythm strip today also) for which he is asymptomatic.  AF is also observed though in small amounts.  He appears to be asymptomatic with his arrhythmias.  He does not tolerate beta blockers very well and has no interest in takinig "more medicines". I would therefore recommend lifestyle change (weight reduction, regular exercise, and compliance with CPAP) as our primary therapy for his minimally symptomatic arrhythmia. chads2vasc score is at least 4.  Given low burden of afib, I have not started anticoagulation though if his burden increases, this may need to be consideredd.  2. Sinus bradycardia Asymptomatic Ultimately, he may do better off of beta blocker therapy as he does not tolerate this very well No indication for pacing  3. Hypertensive cardiovascular disease Reports that his BP was previously better with exercise and prior to gaining weight Lifestyle modification encouraged May consider spironolactone if BP is not improved  4. CAD No ischemic symptoms No changes today  5. OSA Compliance with CPAP encouraged  6. Obesity Weight loss advised  Follow-up with me as needed Follow-up with Dr Tamala Julian as scheduled  Current medicines are reviewed at length with the patient today.   The patient does not have concerns regarding his medicines.  The following changes were made today:  none  Signed, Thompson Grayer, MD  12/15/2015 11:01 AM     [facility] [address] [place] [address] [phone] (office) [phone] (fax)

## 2015-12-15 NOTE — Patient Instructions (Signed)
Medication Instructions:  Your physician recommends that you continue on your current medications as directed. Please refer to the Current Medication list given to you today.   Labwork: None ordered   Testing/Procedures: None ordered   Follow-Up: Your physician recommends that you schedule a follow-up appointment as needed with Dr Rayann Heman and as scheduled with Dr Tamala Julian   ** get regular exercise ** use CPAP

## 2015-12-25 ENCOUNTER — Ambulatory Visit (INDEPENDENT_AMBULATORY_CARE_PROVIDER_SITE_OTHER): Payer: Federal, State, Local not specified - PPO | Admitting: Internal Medicine

## 2015-12-25 ENCOUNTER — Encounter: Payer: Self-pay | Admitting: Interventional Cardiology

## 2015-12-25 VITALS — BP 132/86 | HR 54 | Temp 97.8°F | Resp 16 | Ht 70.0 in | Wt 253.2 lb

## 2015-12-25 DIAGNOSIS — E1159 Type 2 diabetes mellitus with other circulatory complications: Secondary | ICD-10-CM | POA: Diagnosis not present

## 2015-12-25 DIAGNOSIS — Z794 Long term (current) use of insulin: Secondary | ICD-10-CM

## 2015-12-25 LAB — POCT GLYCOSYLATED HEMOGLOBIN (HGB A1C): HEMOGLOBIN A1C: 7.1

## 2015-12-25 NOTE — Patient Instructions (Signed)
Patient Instructions  Please continue: - Basaglar 20 units at bedtime - Glipizide XL 5 mg in am - Metformin 1000 mg 2x a day with meals.  Please return in 3 months with your sugar log.

## 2015-12-25 NOTE — Progress Notes (Signed)
Patient ID: [name], male   DOB: 1950-07-15, 65 y.o.   MRN: [id number]  HPI: [name] is a 65 y.o.-year-old male, returning for f/u for DM2, dx 2007, insulin-dependent since 2014, uncontrolled, with complications (CAD - s/p NSTEMI 11/2014 - stent; ED). Last visit 3 mo ago. PCP: Dr [name].  Last hemoglobin A1c was: Lab Results  Component Value Date   HGBA1C 7.1 09/25/2015   HGBA1C 7.4 (H) 06/19/2015   HGBA1C 7.2 03/20/2015   Pt is on a regimen of: - Metformin 1000 mg 2x a day - Glipizide XL 5 >> 10 >> 5 mg daily in am (decreased as he was having low CBGs in am) - Lantus (did not like Toujeo) 20 units at bedtime - started 07/2014 - now Basaglar 20 units  He was Levemir 140 units in am >> decreased to 70 units>> stopped as this was too expensive for him He stopped Welchol 3.7g at bedtime a day - at last  Stopped Farxiga 10 mg b/c frequent urination and urgency. He was on Glipizide in the past. He was on Metformin in the past >> no N/V.   Pt checks his sugars 2x a day and they are: - am: 90, 166-223, 257, 306 >> 90-163 >> 99-140 >> 120-128 >> 72, 98-156 >> 71-150, 207 >> 106-150 - 2h after b'fast: 150s >> 102, 146, 171, 191 >> n/c >> 128, 134 >> n/c - before lunch: 112-168 (mostly on the low limit) >> n/c >> 120, 224-245 >> 206 >> n/c >> 140s >> n/c - 2h after lunch: n/c >> 210 (corrected the 79 in am) >> n/c >> 144, 205 >> 120, 161 >> 115 >> n/c - before dinner: 134-225, 263 >> 118-205 >> 140s >> 140s >> n/c >> 104-130, 160, 200 >> 120s - 2h after dinner: n/c >> 143, 175 >> n/c  - bedtime: n/c >> 105-165, 185 >> 99-170 >> 160s >> 151 >> 127-180, 200 >> 150-170 - nighttime: n/c >> 134, 210 >> 99-190 >> 94, 190-305 >> 148-216 >> n/c No lows.Lowest sugar was 90 >> 71 >> 100; ? has hypoglycemia awareness  Highest sugar was 360 >> 190 >> 305 >> 200s >> 200s postmeal  - + mild CKD, last BUN/creatinine:  Lab Results  Component Value Date   BUN 18 11/11/2015   CREATININE  1.04 11/11/2015  On Losartan. - last set of lipids: Lab Results  Component Value Date   CHOL 164 06/19/2015   HDL 38.40 (L) 06/19/2015   LDLCALC 114 (H) 06/19/2015   LDLDIRECT 168.0 12/18/2012   TRIG 61.0 06/19/2015   CHOLHDL 4 06/19/2015  On Pravastatin 40 mg qod. - last eye exam was in 11/2015. No DR.  - no numbness and tingling in his feet.  Pt was admitted for CP in 11/2014: NSTEMI >> CAD >> PTCA >> stent placed. He was in cardiac rehab  >> finished.  I reviewed pt's medications, allergies, PMH, social hx, family hx, and changes were documented in the history of present illness. Otherwise, unchanged from my initial visit note.  Started on [employer].  ROS: Constitutional: + weight gain, no fatigue, no subjective hyperthermia/hypothermia Eyes: no blurry vision, no xerophthalmia ENT: no sore throat, no nodules palpated in throat, no dysphagia/odynophagia, no hoarseness Cardiovascular: no CP/SOB/palpitations/+ leg swelling Respiratory: no cough/SOB Gastrointestinal: no N/V/D/C Musculoskeletal: no muscle/ joint aches Skin: no rashes Neurological: no tremors/numbness/tingling/dizziness  PE: BP 132/86   Pulse (!) 54   Temp 97.8 F (36.6 C)  Resp 16   Ht 5\' 10"  (1.778 m)   Wt 253 lb 3.2 oz (114.9 kg)   SpO2 97%   BMI 36.33 kg/m  Body mass index is 36.33 kg/m. Wt Readings from Last 3 Encounters:  12/25/15 253 lb 3.2 oz (114.9 kg)  12/15/15 254 lb 12.8 oz (115.6 kg)  11/11/15 252 lb (114.3 kg)   Constitutional: overweight, in NAD Eyes: PERRLA, EOMI, no exophthalmos ENT: moist mucous membranes, no thyromegaly, no cervical lymphadenopathy Cardiovascular: irreg irreg R, No MRG, + RLE edema (h/o knee surgery) Respiratory: CTA B Gastrointestinal: abdomen soft, NT, ND, BS+ Musculoskeletal: no deformities, strength intact in all 4 Skin: moist, warm, no rashes Neurological: no tremor with outstretched hands, DTR normal in all 4  ASSESSMENT: 1. DM2, insulin-dependent,  uncontrolled, with complications - CAD, s/p NSTEMI, s/p stent 11/2014 - ED  PLAN:  1. Patient with long-standing, uncontrolled diabetes, with now better control.+ some fluctuations, but overall similar control with last visit. I strongly advised him to restart exercise. Will continue current regimen. - I suggested to:  Patient Instructions  Please continue: - Basaglar 20 units at bedtime - Glipizide XL 5 mg in am - Metformin 1000 mg 2x a day with meals.  Please return in 3 months with your sugar log.   - continue checking sugars at different times of the day - check 2 times a day, rotating checks - advised for yearly eye exams, he is up to date >> will send me the report - will check HbA1c today >> 7.1% (same) - Return to clinic in 3 mo with sugar log   [name], MD PhD [facility]

## 2015-12-29 ENCOUNTER — Other Ambulatory Visit: Payer: Self-pay | Admitting: Internal Medicine

## 2015-12-29 ENCOUNTER — Encounter: Payer: Self-pay | Admitting: Physician Assistant

## 2016-01-12 ENCOUNTER — Ambulatory Visit (INDEPENDENT_AMBULATORY_CARE_PROVIDER_SITE_OTHER): Payer: Federal, State, Local not specified - PPO | Admitting: Interventional Cardiology

## 2016-01-12 ENCOUNTER — Other Ambulatory Visit: Payer: Self-pay | Admitting: *Deleted

## 2016-01-12 ENCOUNTER — Encounter: Payer: Self-pay | Admitting: Interventional Cardiology

## 2016-01-12 ENCOUNTER — Encounter: Payer: Self-pay | Admitting: *Deleted

## 2016-01-12 VITALS — BP 120/84 | HR 60 | Ht 70.0 in | Wt 260.4 lb

## 2016-01-12 DIAGNOSIS — I10 Essential (primary) hypertension: Secondary | ICD-10-CM | POA: Diagnosis not present

## 2016-01-12 DIAGNOSIS — E785 Hyperlipidemia, unspecified: Secondary | ICD-10-CM

## 2016-01-12 DIAGNOSIS — I2510 Atherosclerotic heart disease of native coronary artery without angina pectoris: Secondary | ICD-10-CM

## 2016-01-12 DIAGNOSIS — I471 Supraventricular tachycardia: Secondary | ICD-10-CM

## 2016-01-12 DIAGNOSIS — Z006 Encounter for examination for normal comparison and control in clinical research program: Secondary | ICD-10-CM

## 2016-01-12 MED ORDER — AMLODIPINE BESYLATE 5 MG PO TABS: 5.0000 mg | ORAL_TABLET | Freq: Every day | ORAL | 3 refills | Status: DC

## 2016-01-12 MED ORDER — CLOPIDOGREL BISULFATE 75 MG PO TABS: 75.0000 mg | ORAL_TABLET | Freq: Every day | ORAL | 3 refills | Status: DC

## 2016-01-12 NOTE — Patient Instructions (Signed)
Medication Instructions:  1) DISCONTINUE Brilinta  2) STOP Amlodipine for one week and then start Amlodipine 5mg  once daily.  3) START Plavix (Clopidegrel) 75mg  once daily  Labwork: None  Testing/Procedures: None  Follow-Up: Your physician wants you to follow-up in: 8-9 months with Dr. Tamala Julian. You will receive a reminder letter in the mail two months in advance. If you don't receive a letter, please call our office to schedule the follow-up appointment.   Any Other Special Instructions Will Be Listed Below (If Applicable).  After you start your Amlodipine 5mg , monitor your blood pressure and call our office with those readings after one week.    If you need a refill on your cardiac medications before your next appointment, please call your pharmacy.

## 2016-01-12 NOTE — Progress Notes (Signed)
Cardiology Office Note    Date:  01/12/2016   ID:  [name], [name] January 11, 1951, MRN [id number]  PCP:  Ronald Pao, MD  Cardiologist: Ronald Grooms, MD   Chief Complaint  Patient presents with  . Coronary Artery Disease    History of Present Illness:  [name] is a 65 y.o. male  who presents for CAD, circumflex DES, obtuse marginal not stented due to inability of stent to track (likely due to wire tangle).  He is doing well. He denies having any significant episodes of chest discomfort recently. He is in the [place] study. Today is the last day of active therapy.  Has lower extremity swelling. He denies orthopnea and PND.   Past Medical History:  Diagnosis Date  . Adenomatous colon polyp 2011  . Anemia   . Angina pectoris ([place]) 05/01/2015   med rx for 95% OM (u/a to access due to tortuosity), 95% D1 and other moderate CAD at cath  . Arthritis    "back, knees" (05/01/2015)  . Chest pain    pleuritic  . Chronic lower back pain   . Coronary artery disease   . Depression   . GERD (gastroesophageal reflux disease)   . HTN (hypertension)   . Hyperlipidemia   . Obstructive sleep apnea    noncompliant with CPAP  . Osteoarthritis   . Pneumonia ~ 2014 X 1  . Refusal of blood transfusions as patient is Jehovah's Witness   . Type II or unspecified type diabetes mellitus without mention of complication, not stated as uncontrolled     Past Surgical History:  Procedure Laterality Date  . CARDIAC CATHETERIZATION  2004   patent coronary arteries  . CARDIAC CATHETERIZATION  05/01/2015   "tried to put stent in but couldn't"  . CARDIAC CATHETERIZATION N/A 05/01/2015   Procedure: Left Heart Cath and Coronary Angiography;  Surgeon: Ronald Crome, MD; LAD 60%, oD1 90%, pD1 70%, D2 70%, CFX patent stent, 30% distal to prev stent, pRCA 20%, OM1 90/95%, NL LV  . CARDIAC CATHETERIZATION N/A 05/01/2015   Procedure: Coronary Stent Intervention;  Surgeon: Ronald Crome, MD;   Unsuccessful PCI OM due to tortuosity  . CARDIAC CATHETERIZATION N/A 11/06/2014   Procedure: Left Heart Cath and Coronary Angiography;  Surgeon: Ronald Crome, MD;  Location: [facility];  Service: Cardiovascular;  Laterality: N/A;  . CLOSED REDUCTION SHOULDER DISLOCATION Right ~ 1975   "& reattached muscle"  . COLONOSCOPY W/ BIOPSIES AND POLYPECTOMY  X 2  . ESOPHAGOGASTRODUODENOSCOPY ENDOSCOPY    . PILONIDAL CYST EXCISION      Current Medications: Outpatient Medications Prior to Visit  Medication Sig Dispense Refill  . AMBULATORY NON FORMULARY MEDICATION Take 81 mg by mouth daily. Medication Name: ASA 81 Mg daily or PLACEBO (TWILIGHT RESEARCH No open label asa, study provided)    . Blood Glucose Monitoring Suppl (RELION CONFIRM GLUCOSE MONITOR) W/DEVICE KIT 1 Device by Does not apply route once. 1 kit 0  . esomeprazole (NEXIUM) 40 MG capsule Take 40 mg by mouth daily as needed (heartburn or acid reflux).    Marland Kitchen glipiZIDE (GLIPIZIDE XL) 5 MG 24 hr tablet Take 1 tablet (5 mg total) by mouth daily with breakfast. 90 tablet 2  . glucose blood (RELION ULTIMA TEST) test strip 1 each by Other route 2 (two) times daily. And lancets 2/day 100 each 12  . Insulin Glargine (BASAGLAR KWIKPEN) 100 UNIT/ML SOPN INJECT 20 UNITS INTO THE SKIN  DAILY AT 10 PM. 15 mL 3  . Insulin Pen Needle (NOVOFINE PLUS) 32G X 4 MM MISC Use 1x a day 100 each 11  . isosorbide mononitrate (IMDUR) 30 MG 24 hr tablet Take 1 tablet (30 mg total) by mouth daily. 30 tablet 11  . latanoprost (XALATAN) 0.005 % ophthalmic solution Place 1 drop into both eyes at bedtime.  5  . losartan-hydrochlorothiazide (HYZAAR) 100-12.5 MG tablet Take 1 tablet by mouth daily. 30 tablet 11  . metFORMIN (GLUCOPHAGE) 500 MG tablet TAKE 2 TABLETS (1,000 MG TOTAL) BY MOUTH 2 (TWO) TIMES DAILY WITH A MEAL. 360 tablet 0  . metoprolol tartrate (LOPRESSOR) 25 MG tablet Take 0.5 tablets (12.5 mg total) by mouth 2 (two) times daily. TAKE  25 MG IN AM  TAKE  12.5 MG IN PM 60 tablet 3  . nitroGLYCERIN (NITROSTAT) 0.4 MG SL tablet Place 1 tablet (0.4 mg total) under the tongue every 5 (five) minutes x 3 doses as needed. (CHEST PAIN) 25 tablet 12  . tiZANidine (ZANAFLEX) 4 MG tablet Take 4 mg by mouth every 8 (eight) hours as needed. (MUSCLE SPASMS)  2  . traZODone (DESYREL) 50 MG tablet Take 1 tablet (50 mg total) by mouth at bedtime as needed for sleep. 30 tablet 3  . AMBULATORY NON FORMULARY MEDICATION Take 90 mg by mouth 2 (two) times daily. Medication Name: Brilinta 90 mg BID (TWILIGHT Study provided )    . amLODipine (NORVASC) 10 MG tablet TAKE 1 TABLET (10 MG TOTAL) BY MOUTH DAILY. 180 tablet 1  . pravastatin (PRAVACHOL) 20 MG tablet Take 1 tablet (20 mg total) by mouth daily. Start with 1 tablet 2 times a week and increased to daily as tolerated. 30 tablet 11   No facility-administered medications prior to visit.      Allergies:   Statins   Social History   Social History  . Marital status: Married    Spouse name: N/A  . Number of children: 4  . Years of education: N/A   Occupational History  . Retired Unemployed   Social History Main Topics  . Smoking status: Former Smoker    Packs/day: 0.50    Years: 10.00    Types: Cigarettes    Quit date: 04/06/1983  . Smokeless tobacco: Never Used  . Alcohol use 0.0 oz/week     Comment: `/26/2017 "might drink a beer q couple months mostly; summertime I might drink 2-3 beers/week"  . Drug use: No  . Sexual activity: Not Currently   Other Topics Concern  . None   Social History Narrative   Lives in [place] with spouse [name]).  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     Family History:  The patient's family history includes Alcohol abuse in his other; Colon cancer in his paternal uncle; Coronary artery disease in his mother and other; Diabetes in his maternal uncle, mother, paternal uncle, and sister; Heart disease in his  mother; Stroke in his mother.   ROS:   Please see the history of present illness.    Leg swelling is his only complaint  All other systems reviewed and are negative.   PHYSICAL EXAM:   VS:  BP 120/84   Pulse 60   Ht '5\' 10"'$  (1.778 m)   Wt 260 lb 6.4 oz (118.1 kg)   BMI 37.36 kg/m    GEN: Well nourished, well developed, in no acute distress  HEENT:  normal  Neck: no JVD, carotid bruits, or masses Cardiac: RRR; no murmurs, rubs, or gallops. 2-3+ bilateral edema Respiratory:  clear to auscultation bilaterally, normal work of breathing GI: soft, nontender, nondistended, + BS MS: no deformity or atrophy  Skin: warm and dry, no rash Neuro:  Alert and Oriented x 3, Strength and sensation are intact Psych: euthymic mood, full affect  Wt Readings from Last 3 Encounters:  01/12/16 260 lb 6.4 oz (118.1 kg)  12/25/15 253 lb 3.2 oz (114.9 kg)  12/15/15 254 lb 12.8 oz (115.6 kg)      Studies/Labs Reviewed:   EKG:  EKG  EKG is not repeated  Recent Labs: 06/19/2015: ALT 17 11/11/2015: BUN 18; Creat 1.04; Hemoglobin 13.6; Platelets 240; Potassium 4.3; Sodium 142; TSH 1.52   Lipid Panel    Component Value Date/Time   CHOL 164 06/19/2015 1624   TRIG 61.0 06/19/2015 1624   HDL 38.40 (L) 06/19/2015 1624   CHOLHDL 4 06/19/2015 1624   VLDL 12.2 06/19/2015 1624   LDLCALC 114 (H) 06/19/2015 1624   LDLDIRECT 168.0 12/18/2012 1340    Additional studies/ records that were reviewed today include:  No new data    ASSESSMENT:    1. CAD in native artery   2. Essential hypertension, benign   3. Hyperlipidemia with target LDL less than 100   4. PAT (paroxysmal atrial tachycardia) (HCC)      PLAN:  In order of problems listed above:  1. Stable angina. We'll start clopidogrel 75 mg daily. No aspirin. 2. 2 g sodium diet. Lower extremity edema is likely related to amlodipine. We'll discontinue amlodipine for one week to allow swelling to abate. We will then resume 5 mg daily. He will  monitor blood pressure on the lower dose to make sure we are not allowing worsen hypertension. 3. Continue statin therapy. 4. Not addressed and no recent symptoms.    Medication Adjustments/Labs and Tests Ordered: Current medicines are reviewed at length with the patient today.  Concerns regarding medicines are outlined above.  Medication changes, Labs and Tests ordered today are listed in the Patient Instructions below. Patient Instructions  Medication Instructions:  1) DISCONTINUE Brilinta  2) STOP Amlodipine for one week and then start Amlodipine 67m once daily.  3) START Plavix (Clopidegrel) 7514monce daily  Labwork: None  Testing/Procedures: None  Follow-Up: Your physician wants you to follow-up in: 8-9 months with Dr. SmTamala JulianYou will receive a reminder letter in the mail two months in advance. If you don't receive a letter, please call our office to schedule the follow-up appointment.   Any Other Special Instructions Will Be Listed Below (If Applicable).  After you start your Amlodipine 14m12mmonitor your blood pressure and call our office with those readings after one week.    If you need a refill on your cardiac medications before your next appointment, please call your pharmacy.      Signed, HenSinclair GroomsD  01/12/2016 3:01 PM    [facility] [id number]  [id number]: [phone]: [phone]

## 2016-01-12 NOTE — Progress Notes (Addendum)
TWILIGHT Research study 15 month end of treatment visit completed. Patient states he had a nose bleed about 3 weeks ago (12/22/15) that last about 5 minutes and required no intervention. According to office note patient will start Plavix alone (no ASA) and has script. Patient has been 83 % compliant with ASA/PLACEBO and 80% compliant with brilinta after pill count. Questions encouraged and answered. 18 month telephone visit is the last research visit  Required.

## 2016-01-14 ENCOUNTER — Telehealth: Payer: Self-pay | Admitting: *Deleted

## 2016-01-14 NOTE — Telephone Encounter (Signed)
Called patient to instruct him that Dr. Jocelyn Lamer wanted him to take PLAVIX only No ASA. Patient verbalized understanding.

## 2016-02-10 ENCOUNTER — Encounter: Payer: Self-pay | Admitting: Physician Assistant

## 2016-02-11 ENCOUNTER — Telehealth: Payer: Self-pay | Admitting: *Deleted

## 2016-02-11 ENCOUNTER — Encounter: Payer: Self-pay | Admitting: *Deleted

## 2016-02-11 ENCOUNTER — Encounter: Payer: Self-pay | Admitting: Internal Medicine

## 2016-02-11 DIAGNOSIS — R600 Localized edema: Secondary | ICD-10-CM | POA: Diagnosis not present

## 2016-02-11 NOTE — Telephone Encounter (Signed)
Pt walked into the office earlier wanting to cancel appt for tomorrow and see about just getting an echo.  Advised pt that I was still waiting to hear from Dr. Tamala Julian on recommendations.  While here I noticed pt does have BLE swelling which pt states is better than it normally is.  Pt noted to not be in any distress while here.  Spoke with pt in detail about his symptoms and my concerns.  Pt agreeable to keep appt for tomorrow.  Advised I will call once I hear from Dr. Tamala Julian.  Pt appreciative for assistance.

## 2016-02-11 NOTE — Telephone Encounter (Signed)
This encounter was created in error - please disregard.

## 2016-02-11 NOTE — Telephone Encounter (Signed)
-----   Message -----  From: [name]  Sent: 02/10/2016 11:47 PM  To: [facility] Triage  Subject: Non-Urgent Medical Question             dr reduced amlodipine 10mg  to 5 swelling in legs reduced . i have another problem I want to make you aware of /one event of severe sweating to the point of my clothing was wet/I am now short of breath when walking/my head neck shoulders hurt/weak and more confused than ever/ can you help with this one the new medication    Pt states two Sundays ago he was in a meeting and suddenly started sweating and vision was blurry.  This lasted about 10 mins and resolved.  Pt denies anymore episodes since then.  Pt does c/o neck, head and back pain that comes and goes.  This pain improves with Motrin.  Pt did not have actual BP readings but states he has been checking it and it has been elevated.  Has had some chest tightness off and on.  Also having some confusion and HA. SOB with exertion.  Pt states he was walking from the parking lot to an event a few days ago and had to stop and take a break.  This usually occurs when going longer distances. Pt states that symptoms seem to correlate with the time of the medication change from Brilinta to Plavix.  Pt has appt with vein specialist so states he is unable to come in today.  Scheduled pt to see Robbie Lis, PA-C tomorrow and advised pt if symptoms worsen to please go to ER.  Advised I will send message to Dr. Tamala Julian in the meantime for review.  Pt appreciative for assistance.

## 2016-02-12 ENCOUNTER — Encounter: Payer: Self-pay | Admitting: Physician Assistant

## 2016-02-12 ENCOUNTER — Encounter: Payer: Self-pay | Admitting: Internal Medicine

## 2016-02-12 ENCOUNTER — Ambulatory Visit (INDEPENDENT_AMBULATORY_CARE_PROVIDER_SITE_OTHER): Payer: Federal, State, Local not specified - PPO | Admitting: Physician Assistant

## 2016-02-12 VITALS — BP 148/80 | HR 77 | Ht 70.0 in | Wt 255.0 lb

## 2016-02-12 DIAGNOSIS — R0609 Other forms of dyspnea: Secondary | ICD-10-CM | POA: Diagnosis not present

## 2016-02-12 DIAGNOSIS — I119 Hypertensive heart disease without heart failure: Secondary | ICD-10-CM

## 2016-02-12 DIAGNOSIS — I471 Supraventricular tachycardia: Secondary | ICD-10-CM

## 2016-02-12 DIAGNOSIS — I10 Essential (primary) hypertension: Secondary | ICD-10-CM

## 2016-02-12 DIAGNOSIS — I2510 Atherosclerotic heart disease of native coronary artery without angina pectoris: Secondary | ICD-10-CM

## 2016-02-12 DIAGNOSIS — R079 Chest pain, unspecified: Secondary | ICD-10-CM | POA: Diagnosis not present

## 2016-02-12 DIAGNOSIS — I4719 Other supraventricular tachycardia: Secondary | ICD-10-CM

## 2016-02-12 DIAGNOSIS — R0600 Dyspnea, unspecified: Secondary | ICD-10-CM

## 2016-02-12 DIAGNOSIS — I480 Paroxysmal atrial fibrillation: Secondary | ICD-10-CM

## 2016-02-12 DIAGNOSIS — E785 Hyperlipidemia, unspecified: Secondary | ICD-10-CM

## 2016-02-12 DIAGNOSIS — G4733 Obstructive sleep apnea (adult) (pediatric): Secondary | ICD-10-CM

## 2016-02-12 MED ORDER — ISOSORBIDE MONONITRATE ER 60 MG PO TB24: 60.0000 mg | ORAL_TABLET | Freq: Every day | ORAL | 11 refills | Status: DC

## 2016-02-12 NOTE — Telephone Encounter (Signed)
The complaints are vague. It is a good idea for him to have an office visit as already scheduled.

## 2016-02-12 NOTE — Telephone Encounter (Signed)
Spoke with pt and advised him that Dr. Tamala Julian would like for him to keep appt to be seen.  Pt verbalized understanding and was in agreement with this plan.

## 2016-02-12 NOTE — Patient Instructions (Signed)
Your physician has recommended you make the following change in your medication:  [place]  Your physician recommends that you return for lab work in: [place] has requested that you have an echocardiogram. Echocardiography is a painless test that uses sound waves to create images of your heart. It provides your doctor with information about the size and shape of your heart and how well your heart's chambers and valves are working. This procedure takes approximately one hour. There are no restrictions for this procedure.   Your physician recommends that you schedule a follow-up appointment in:  [place]

## 2016-02-12 NOTE — Progress Notes (Signed)
Cardiology Office Note    Date:  02/12/2016   ID:  Merrill, Villarruel [birth year], MRN [id number]  PCP:  [name], MD  Cardiologist:  Dr. Tamala Julian  EP: Dr. Rayann Heman  Chief Complaint: Dyspnea on exertion  History of Present Illness:   [name] is a 65 y.o. male with hx of CAD s/p DES to LCx 11/2014, OM1 not stented due to inability of stent to track (likely due to wire tangle), HTN, HLD, OSA not on CAPA and PAT/PAF (not on anticoagulation due to low burden) who resented for evaluation of dyspnea on exertion.   Last cath 05/01/2015  (outpateint) due to angina showed continued widely patent mid circumflex stent, 95% stenosis in the mid body of the first obtuse marginal, representing restenosis after angioplasty in August reduced the lesion to 50%, 90% ostial first diagonal and otherwise no change in anatomy compared to the prior post PCI angiogram. Dr Tamala Julian attempted and failed first obtuse marginal PTCA due to inability to cross the stenosis. Angulation in the vessel prevented torque ability and he was unable to solve lack of intra-coronary support. Recommended medical therapy.   He had monitored placed 10/28/15 for palpitations showing PAT/PAF. The patient was evaluated by Dr. Rayann Heman 9/11/17who felt that he did have salvos of atrial tachycardia as well as short afib.  In retrospect, he did not have symptoms at that time. Patient has not tolerated BB in past and wished not to start again. Recommend lifestyle change (weight reduction, regular exercise, and compliance with CPAP) as  primary therapy for his minimally symptomatic arrhythmia. Chads2vasc score is at least 4.  Given low burden of afib,  he was not started anticoagulation though if his burden increases, this may need to be considered.  He was doing ok when last seen by Dr. Tamala Julian 01/12/16. Decreased amlodipine due to LE edema. He finished TWILIGHT study. Advised to stop ASA and start Plavix.   2 weeks ago he had an episode of  sudden onset sweating and blurry vision that resolved in 10 minutes while was in meeting. No reoccurrence. 1 weeks ago he had shortness of breath with extreme exertion at substernal area that improved with rest. No recurrence. Feels tired and fatigue. No palpitations, orthopnea, PND or syncope.   Last echo 07/2013 showed LV EF of 55-60%, no WM abnormality, grade 1 DD, mild AR and moderately dilated LA.    Past Medical History:  Diagnosis Date  . Adenomatous colon polyp 2011  . Anemia   . Angina pectoris ([place]) 05/01/2015   med rx for 95% OM (u/a to access due to tortuosity), 95% D1 and other moderate CAD at cath  . Arthritis    "back, knees" (05/01/2015)  . Chest pain    pleuritic  . Chronic lower back pain   . Coronary artery disease   . Depression   . GERD (gastroesophageal reflux disease)   . HTN (hypertension)   . Hyperlipidemia   . Obstructive sleep apnea    noncompliant with CPAP  . Osteoarthritis   . Pneumonia ~ 2014 X 1  . Refusal of blood transfusions as patient is Jehovah's Witness   . Type II or unspecified type diabetes mellitus without mention of complication, not stated as uncontrolled     Past Surgical History:  Procedure Laterality Date  . CARDIAC CATHETERIZATION  2004   patent coronary arteries  . CARDIAC CATHETERIZATION  05/01/2015   "tried to put stent in but couldn't"  . CARDIAC CATHETERIZATION  N/A 05/01/2015   Procedure: Left Heart Cath and Coronary Angiography;  Surgeon: Belva Crome, MD; LAD 60%, oD1 90%, pD1 70%, D2 70%, CFX patent stent, 30% distal to prev stent, pRCA 20%, OM1 90/95%, NL LV  . CARDIAC CATHETERIZATION N/A 05/01/2015   Procedure: Coronary Stent Intervention;  Surgeon: Belva Crome, MD;  Unsuccessful PCI OM due to tortuosity  . CARDIAC CATHETERIZATION N/A 11/06/2014   Procedure: Left Heart Cath and Coronary Angiography;  Surgeon: Belva Crome, MD;  Location: [facility];  Service: Cardiovascular;  Laterality: N/A;  . CLOSED REDUCTION  SHOULDER DISLOCATION Right ~ 1975   "& reattached muscle"  . COLONOSCOPY W/ BIOPSIES AND POLYPECTOMY  X 2  . ESOPHAGOGASTRODUODENOSCOPY ENDOSCOPY    . PILONIDAL CYST EXCISION      Current Medications: Prior to Admission medications   Medication Sig Start Date End Date Taking? Authorizing Provider  amLODipine (NORVASC) 5 MG tablet Take 1 tablet (5 mg total) by mouth daily. 01/12/16 04/11/16  Belva Crome, MD  Blood Glucose Monitoring Suppl (RELION CONFIRM GLUCOSE MONITOR) W/DEVICE KIT 1 Device by Does not apply route once. 05/01/12   Renato Shin, MD  clopidogrel (PLAVIX) 75 MG tablet Take 1 tablet (75 mg total) by mouth daily. 01/12/16   Belva Crome, MD  esomeprazole (NEXIUM) 40 MG capsule Take 40 mg by mouth daily as needed (heartburn or acid reflux).    Historical Provider, MD  glipiZIDE (GLIPIZIDE XL) 5 MG 24 hr tablet Take 1 tablet (5 mg total) by mouth daily with breakfast. 03/20/15   [name], MD  glucose blood (RELION ULTIMA TEST) test strip 1 each by Other route 2 (two) times daily. And lancets 2/day 05/01/12   Renato Shin, MD  Insulin Glargine [facility]) 100 UNIT/ML SOPN INJECT 20 UNITS INTO THE SKIN DAILY AT 10 PM. 12/29/15   [name], MD  Insulin Pen Needle (NOVOFINE PLUS) 32G X 4 MM MISC Use 1x a day 05/09/14   [name], MD  isosorbide mononitrate (IMDUR) 30 MG 24 hr tablet Take 1 tablet (30 mg total) by mouth daily. 05/02/15   Rhonda G Barrett, PA-C  latanoprost (XALATAN) 0.005 % ophthalmic solution Place 1 drop into both eyes at bedtime. 03/21/15   Historical Provider, MD  losartan-hydrochlorothiazide (HYZAAR) 100-12.5 MG tablet Take 1 tablet by mouth daily. 02/12/15   Belva Crome, MD  metFORMIN (GLUCOPHAGE) 500 MG tablet TAKE 2 TABLETS (1,000 MG TOTAL) BY MOUTH 2 (TWO) TIMES DAILY WITH A MEAL. 08/27/15   [name], MD  metoprolol tartrate (LOPRESSOR) 25 MG tablet Take 0.5 tablets (12.5 mg total) by mouth 2 (two) times daily. TAKE  25 MG IN AM   TAKE 12.5 MG IN PM 11/11/15   Burtis Junes, NP  nitroGLYCERIN (NITROSTAT) 0.4 MG SL tablet Place 1 tablet (0.4 mg total) under the tongue every 5 (five) minutes x 3 doses as needed. (CHEST PAIN) 05/02/15   Evelene Croon Barrett, PA-C  pravastatin (PRAVACHOL) 40 MG tablet Take 40 mg by mouth daily. 12/13/15   Historical Provider, MD  tiZANidine (ZANAFLEX) 4 MG tablet Take 4 mg by mouth every 8 (eight) hours as needed. (MUSCLE SPASMS) 03/20/15   Historical Provider, MD  traZODone (DESYREL) 50 MG tablet Take 1 tablet (50 mg total) by mouth at bedtime as needed for sleep. 12/06/14   Belva Crome, MD    Allergies:   Statins   Social History   Social History  . Marital status: Married  Spouse name: N/A  . Number of children: 4  . Years of education: N/A   Occupational History  . Retired Unemployed   Social History Main Topics  . Smoking status: Former Smoker    Packs/day: 0.50    Years: 10.00    Types: Cigarettes    Quit date: 04/06/1983  . Smokeless tobacco: Never Used  . Alcohol use 0.0 oz/week     Comment: `/26/2017 "might drink a beer q couple months mostly; summertime I might drink 2-3 beers/week"  . Drug use: No  . Sexual activity: Not Currently   Other Topics Concern  . None   Social History Narrative   Lives in [place] with spouse Tania Perrott).  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     Family History:  The patient's family history includes Alcohol abuse in his other; Colon cancer in his paternal uncle; Coronary artery disease in his mother and other; Diabetes in his maternal uncle, mother, paternal uncle, and sister; Heart disease in his mother; Stroke in his mother.   ROS:   Please see the history of present illness.    ROS All other systems reviewed and are negative.   PHYSICAL EXAM:   VS:  BP (!) 148/80 (BP Location: Right Arm, Patient Position: Sitting, Cuff Size: Large)   Pulse 77   Ht '5\' 10"'$  (1.778 m)   Wt  255 lb (115.7 kg)   BMI 36.59 kg/m    GEN: Well nourished, well developed, in no acute distress  HEENT: normal  Neck: +  JVD, carotid bruits, or masses Cardiac: RRR; no murmurs, rubs, or gallops, 1-2+ BL LE edema  Respiratory:  clear to auscultation bilaterally, normal work of breathing GI: soft, nontender, nondistended, + BS MS: no deformity or atrophy  Skin: warm and dry, no rash Neuro:  Alert and Oriented x 3, Strength and sensation are intact Psych: euthymic mood, full affect  Wt Readings from Last 3 Encounters:  02/12/16 255 lb (115.7 kg)  01/12/16 260 lb 6.4 oz (118.1 kg)  12/25/15 253 lb 3.2 oz (114.9 kg)      Studies/Labs Reviewed:   EKG:  EKG is ordered today.  The ekg ordered today demonstrates 1st  Sinus rhythm with PACs and concerning for sinus arrhthmia with ? Atrial flutter and bundle branch block. 2nd EKG showed sinus rhythm with PACs  Recent Labs: 06/19/2015: ALT 17 11/11/2015: BUN 18; Creat 1.04; Hemoglobin 13.6; Platelets 240; Potassium 4.3; Sodium 142; TSH 1.52   Lipid Panel    Component Value Date/Time   CHOL 164 06/19/2015 1624   TRIG 61.0 06/19/2015 1624   HDL 38.40 (L) 06/19/2015 1624   CHOLHDL 4 06/19/2015 1624   VLDL 12.2 06/19/2015 1624   LDLCALC 114 (H) 06/19/2015 1624   LDLDIRECT 168.0 12/18/2012 1340    Additional studies/ records that were reviewed today include:   Echocardiogram: 07/16/2013 LV EF: 55% -  60%  ------------------------------------------------------------ Indications:   Chest pain 786.51.  ------------------------------------------------------------ History:  PMH: Bradycardia, Hyperlipidemia Risk factors: Hypertension. Diabetes mellitus.  ------------------------------------------------------------ Study Conclusions  - Left ventricle: The cavity size was normal. There was mild concentric hypertrophy. Systolic function was normal. The estimated ejection fraction was in the range of 55% to 60%. Wall motion  was normal; there were no regional wall motion abnormalities. Doppler parameters are consistent with abnormal left ventricular relaxation (grade 1 diastolic dysfunction). - Aortic valve: Mild regurgitation. - Mitral valve: Calcified annulus. -  Left atrium: The atrium was moderately dilated.  Cardiac Catheterization: 11/06/2014 Left Heart Cath and Coronary Angiography  Conclusion   1. 1st Diag lesion, 70% stenosed. 2. Ost 1st Diag, 70% stenosed. 3. 2nd Diag lesion, 75% stenosed. 4. Mid LAD to Dist LAD lesion, 60% stenosed. 5. Prox RCA to Dist RCA lesion, 25% stenosed. 6. Dist Cx lesion, 30% stenosed. 7. Mid Cx to Dist Cx lesion, 95% stenosed. There is a 0% residual stenosis post intervention. The lesion was previously treated with a stent (unknown type) . 8. A drug-eluting stent was placed. 9. 1st Mrg lesion, 85% stenosed. There is a 50% residual stenosis post intervention.    Non-ST elevation myocardial infarction due to high-grade obstruction in the mid circumflex. Also noted is significant stenosis in an angulated and tortuous moderate-sized first obtuse marginal, moderately severe first and second diagonal artery stenoses, and moderate mid LAD stenosis.there are mild luminal irregularities throughout the right coronary and in the left main.  Successful drug-eluting stent implantation in the mid circumflex reducing a 95% stenosis to 0%.  Angioplasty of the first obtuse marginal reducing stenosis from 85% to 50%. Due to angulation and tortuosity, we will unable to deliver a stent. Also possible that wrapped wire on circumflex buddy prevented stent delivery.  Normal left ventricular function, EF 55%   Recommendations:   Aggressive risk factor modification  Medical therapy for obtuse marginal #1, diagonal #1, and diagonal #2 .  Potential discharge in a.m. assuming no ischemic symptoms overnight.  If PCI on the obtuse marginal #1 becomes necessary, more aggressive guide  support will be needed if in the right radial was suggest a 4 cm CLS or XB. Could also consider femoral approach. May require buddy wire in the OM to accomplish.  Dual antiplatelets therapy for 6-12 months   Cardiac Catheterization: 05/01/2015 Left Heart Cath and Coronary Angiography  Conclusion   10. 1st Diag lesion, 70% stenosed. 11. Ost 1st Diag to 1st Diag lesion, 70% stenosed. 12. 2nd Diag lesion, 75% stenosed. 13. Mid LAD to Dist LAD lesion, 60% stenosed. 14. Prox RCA to Dist RCA lesion, 25% stenosed. 15. Dist Cx lesion, 30% stenosed. 16. Ost 1st Diag lesion, 90% stenosed. 63. 1st Mrg-1 lesion, 95% stenosed. 18. 1st Mrg-2 lesion, 95% stenosed. Post intervention, there is a 95% residual stenosis. The lesion was previously treated with angioplasty.    Continued widely patent mid circumflex stent.  95% stenosis in the mid body of the first obtuse marginal, representing restenosis after angioplasty in August reduced the lesion to 50%.  90% ostial first diagonal  Otherwise no change in anatomy compared to the prior post PCI angiogram  Normal LV function  Failed first obtuse marginal PTCA due to inability to cross the stenosis. Angulation in the vessel prevented torque ability and we will unable to solve lack of intra-coronary support.   Recommendation:   Continue medical therapy  Monitor overnight and discharge in a.m.  No change in medical regimen  Resume phase II cardiac rehabilitation    48 hours Holtor Monitor 10/28/2015  Basic underlying rhythm is NSR with PAC's  Frequent PAC's are noted.  Frequent bouts of atrial tach at >170 bpm for 2-5 beats with blocked conduction.  Average HR 75 bpm with range 46 to 135 bpm  Brief bouts of sustained tachycardia, possibly atrial fib at > 150 bpm.  No diary   Brief bouts of atrial fib. Predominant sinus rhythm. Occasional sinus bradycardia. Rare PVC's.   ASSESSMENT & PLAN:  1. Dyspnea on exertion - LE edema  has improved decreased dose of amlodipine. Mild volume overload on exam. Will get BNP and echo. He thinks that his symptoms started after switching Brilinta to Plavix. He wish to go back on Brillinta. Will discuss with Dr. Tamala Julian. Can not r/o if it is angina equivalent or not. However s/s is slightly different. Will increase Imdur to 84m qd. This will also helps to improve his blood pressure.    2. CAD s/p DES to LCX 11/2014 - Last cath 04/2015 showed patent stent and no PCI target of 1st OM and 1st ostial diagonal. He has finished TWILIGHT study. Continue Plavix for now. As above.   3.  HTN - Minimally elevated. Medication change as above.   4. LE Edema - Improved with reduce amlodipine dose  5. HLD - Hx of intolerance to statins and unable to afford PCSK9 inhibitors  6. PAT/PAF - He was evaluated by Dr. ARayann Hemanand recommended lifestyle changes. CHADSVASCs score of 4. Due to low burden, he was not started on anticoagulation however needs to consider if worsen.  - Today EKG concerning for Sinus arrhthymias. Reviewed with DOD Dr. CCurt Bearswho recommenced continue current medical therapy and close f/u with Dr. ARayann Heman   7. OSA - He will need repeat study. Will let [place] if interested. Previously did not tolerated CPAP. Encouraged weight loss.    Medication Adjustments/Labs and Tests Ordered: Current medicines are reviewed at length with the patient today.  Concerns regarding medicines are outlined above.  Medication changes, Labs and Tests ordered today are listed in the Patient Instructions below. Patient Instructions  Your physician has recommended you make the following change in your medication:  INCREASE  ISOSORBIDE TO  60 MG  EVERYDAY  Your physician recommends that you return for lab work in: [place] have [place]. Echocardiography is a painless test that uses sound waves to create images of your heart. It provides your doctor  with information about the size and shape of your heart and how well your heart's chambers and valves are working. This procedure takes approximately one hour. There are no restrictions for this procedure.   Your physician recommends that you schedule a follow-up appointment in:  [facility] WITH DR ARema Jasmine PA  02/12/2016 3:51 PM    [facility] [address] [place]  [id number]: [phone] Fax: [phone]

## 2016-02-13 ENCOUNTER — Telehealth: Payer: Self-pay | Admitting: *Deleted

## 2016-02-13 LAB — BRAIN NATRIURETIC PEPTIDE: Brain Natriuretic Peptide: 24.8 pg/mL (ref <100)

## 2016-02-13 MED ORDER — TICAGRELOR 60 MG PO TABS: 60.0000 mg | ORAL_TABLET | Freq: Two times a day (BID) | ORAL | 1 refills | Status: DC

## 2016-02-13 NOTE — Telephone Encounter (Signed)
-----   Message from [place], [place] sent at 02/13/2016 10:14 AM EST ----- Dicussed with Dr. Tamala Julian and he is ok to switch back patient on Brillinta 60mg  BID (to decrease bleeding risk) and stop Plavix.   F/u with Dr. Rayann Heman as advised by Dr. Curt Bears yesterday for arrhthymias.   Thanks  [name]

## 2016-02-13 NOTE — Telephone Encounter (Signed)
Left pt a message, per Robbie Lis, PA-C, to let him know that Dr. Tamala Julian was ok for him to d/c Plavix and go back on Brilinta 60 mg bid.  I have send the rx into pharmacy already for pt.

## 2016-02-14 ENCOUNTER — Other Ambulatory Visit: Payer: Self-pay | Admitting: Internal Medicine

## 2016-02-16 ENCOUNTER — Telehealth: Payer: Self-pay | Admitting: Physician Assistant

## 2016-02-16 ENCOUNTER — Telehealth: Payer: Self-pay | Admitting: Interventional Cardiology

## 2016-02-16 DIAGNOSIS — Z6834 Body mass index (BMI) 34.0-34.9, adult: Secondary | ICD-10-CM | POA: Diagnosis not present

## 2016-02-16 DIAGNOSIS — Z794 Long term (current) use of insulin: Secondary | ICD-10-CM | POA: Diagnosis not present

## 2016-02-16 DIAGNOSIS — I209 Angina pectoris, unspecified: Secondary | ICD-10-CM | POA: Diagnosis not present

## 2016-02-16 DIAGNOSIS — I10 Essential (primary) hypertension: Secondary | ICD-10-CM | POA: Diagnosis not present

## 2016-02-16 DIAGNOSIS — D692 Other nonthrombocytopenic purpura: Secondary | ICD-10-CM | POA: Diagnosis not present

## 2016-02-16 DIAGNOSIS — T888 Other specified complications of surgical and medical care, not elsewhere classified: Secondary | ICD-10-CM | POA: Diagnosis not present

## 2016-02-16 DIAGNOSIS — I119 Hypertensive heart disease without heart failure: Secondary | ICD-10-CM | POA: Diagnosis not present

## 2016-02-16 DIAGNOSIS — E7800 Pure hypercholesterolemia, unspecified: Secondary | ICD-10-CM | POA: Diagnosis not present

## 2016-02-16 DIAGNOSIS — Z9861 Coronary angioplasty status: Secondary | ICD-10-CM | POA: Diagnosis not present

## 2016-02-16 DIAGNOSIS — Z23 Encounter for immunization: Secondary | ICD-10-CM | POA: Diagnosis not present

## 2016-02-16 DIAGNOSIS — N3281 Overactive bladder: Secondary | ICD-10-CM | POA: Diagnosis not present

## 2016-02-16 DIAGNOSIS — R972 Elevated prostate specific antigen [PSA]: Secondary | ICD-10-CM | POA: Diagnosis not present

## 2016-02-16 DIAGNOSIS — I2510 Atherosclerotic heart disease of native coronary artery without angina pectoris: Secondary | ICD-10-CM | POA: Diagnosis not present

## 2016-02-16 NOTE — Telephone Encounter (Signed)
Pt been advised to d/c plavix and restart brilinta 60 bid

## 2016-02-16 NOTE — Telephone Encounter (Signed)
New message  Pt is returning Witty's call  Please call back

## 2016-02-16 NOTE — Telephone Encounter (Signed)
Note not needed . Closed encounter

## 2016-02-16 NOTE — Telephone Encounter (Signed)
-----   Message from [place], [place] sent at 02/13/2016  2:01 PM EST ----- BNP normal.

## 2016-02-16 NOTE — Telephone Encounter (Signed)
Pt aware that he can d/c plavix and restart brilinta 60 mg bid.  Pt agreeable with this plan and verbalized .

## 2016-02-18 ENCOUNTER — Encounter: Payer: Self-pay | Admitting: Physician Assistant

## 2016-02-18 DIAGNOSIS — I8311 Varicose veins of right lower extremity with inflammation: Secondary | ICD-10-CM | POA: Diagnosis not present

## 2016-02-18 DIAGNOSIS — R600 Localized edema: Secondary | ICD-10-CM | POA: Diagnosis not present

## 2016-02-18 DIAGNOSIS — I8312 Varicose veins of left lower extremity with inflammation: Secondary | ICD-10-CM | POA: Diagnosis not present

## 2016-02-25 DIAGNOSIS — R600 Localized edema: Secondary | ICD-10-CM | POA: Diagnosis not present

## 2016-02-28 ENCOUNTER — Other Ambulatory Visit: Payer: Self-pay | Admitting: Interventional Cardiology

## 2016-03-01 ENCOUNTER — Encounter: Payer: Self-pay | Admitting: Physician Assistant

## 2016-03-04 ENCOUNTER — Other Ambulatory Visit: Payer: Self-pay

## 2016-03-04 DIAGNOSIS — E1159 Type 2 diabetes mellitus with other circulatory complications: Secondary | ICD-10-CM

## 2016-03-04 DIAGNOSIS — Z794 Long term (current) use of insulin: Secondary | ICD-10-CM

## 2016-03-04 MED ORDER — INSULIN GLARGINE 100 UNIT/ML SOLOSTAR PEN: 20.0000 [IU] | PEN_INJECTOR | Freq: Every day | SUBCUTANEOUS | 99 refills | Status: DC

## 2016-03-08 ENCOUNTER — Other Ambulatory Visit: Payer: Self-pay

## 2016-03-08 ENCOUNTER — Other Ambulatory Visit (HOSPITAL_COMMUNITY): Payer: Federal, State, Local not specified - PPO

## 2016-03-08 ENCOUNTER — Ambulatory Visit (HOSPITAL_COMMUNITY): Payer: Federal, State, Local not specified - PPO | Attending: Cardiology

## 2016-03-08 DIAGNOSIS — R079 Chest pain, unspecified: Secondary | ICD-10-CM | POA: Diagnosis not present

## 2016-03-13 ENCOUNTER — Encounter: Payer: Self-pay | Admitting: Internal Medicine

## 2016-03-15 ENCOUNTER — Encounter: Payer: Self-pay | Admitting: Nurse Practitioner

## 2016-03-15 ENCOUNTER — Encounter: Payer: Self-pay | Admitting: Internal Medicine

## 2016-03-25 ENCOUNTER — Encounter: Payer: Self-pay | Admitting: Internal Medicine

## 2016-03-25 ENCOUNTER — Ambulatory Visit (INDEPENDENT_AMBULATORY_CARE_PROVIDER_SITE_OTHER): Payer: Federal, State, Local not specified - PPO | Admitting: Internal Medicine

## 2016-03-25 VITALS — BP 130/82 | HR 76 | Temp 97.7°F | Ht 70.0 in | Wt 255.4 lb

## 2016-03-25 DIAGNOSIS — Z794 Long term (current) use of insulin: Secondary | ICD-10-CM

## 2016-03-25 DIAGNOSIS — E1159 Type 2 diabetes mellitus with other circulatory complications: Secondary | ICD-10-CM | POA: Diagnosis not present

## 2016-03-25 LAB — POCT GLYCOSYLATED HEMOGLOBIN (HGB A1C)

## 2016-03-25 NOTE — Patient Instructions (Addendum)
Please continue: - Lantus 20 units at bedtime - Glipizide XL 5 mg in am - Metformin 1000 mg 2x a day with meals.  Add 1/2 a tablet of Glipizide before dinner for a regular dinner and 2x 1/2 tabs before a larger dinner.  Please return in 3 months with your sugar log.

## 2016-03-25 NOTE — Progress Notes (Signed)
Patient ID: [name], male   DOB: 10-22-1950, 65 y.o.   MRN: [id number]  HPI: RORY SWATEK is a 65 y.o.-year-old male, returning for f/u for DM2, dx 2007, insulin-dependent since 2014, uncontrolled, with complications (CAD - s/p NSTEMI 11/2014 - stent; ED). Last visit 3 mo ago. PCP: Dr Odette Fraction.  He tells me the ISDN made him feel tired and hungry >> he ate more and was inactive. He is now off for 1 week. He feels better.  Last hemoglobin A1c was: Lab Results  Component Value Date   HGBA1C 7.1 12/25/2015   HGBA1C 7.1 09/25/2015   HGBA1C 7.4 (H) 06/19/2015   Pt is on a regimen of: - Metformin 1000 mg 2x a day - Glipizide XL 5 >> 10 >> 5 mg daily in am (decreased as he was having low CBGs in am) - Lantus (did not like Toujeo) 20 units at bedtime >> Basaglar  >> Lantus 20 units at night (feels better on Lantus and it is more efficacious)  He was Levemir 140 units in am >> decreased to 70 units>> stopped as this was too expensive for him He stopped Welchol 3.7g at bedtime a day - at last  Stopped Farxiga 10 mg b/c frequent urination and urgency. He was on Glipizide in the past. He was on Metformin in the past >> no N/V.   Pt checks his sugars 2x a day and they are: - am: 90-163 >> 99-140 >> 120-128 >> 72, 98-156 >> 71-150, 207 >> 106-150 >> 130-157 - 2h after b'fast: 150s >> 102, 146, 171, 191 >> n/c >> 128, 134 >> n/c - before lunch:  n/c >> 120, 224-245 >> 206 >> n/c >> 140s >> n/c - 2h after lunch:210 (corrected the 79 in am) >> n/c >> 144, 205 >> 120, 161 >> 115 >> n/c - before dinner: 118-205 >> 140s >> 140s >> n/c >> 104-130, 160, 200 >> 120s >> 109-119 - 2h after dinner: n/c >> 143, 175 >> n/c >> 170-190 - bedtime: n/c >> 105-165, 185 >> 99-170 >> 160s >> 151 >> 127-180, 200 >> n/c - nighttime: n/c >> 134, 210 >> 99-190 >> 94, 190-305 >> 148-216 >> n/c  No lows.Lowest sugar was 100 >> ; ? has hypoglycemia awareness  Highest sugar was 200s postmeal >>   - + mild CKD,  last BUN/creatinine:  Lab Results  Component Value Date   BUN 18 11/11/2015   CREATININE 1.04 11/11/2015  On Losartan. - last set of lipids: Lab Results  Component Value Date   CHOL 164 06/19/2015   HDL 38.40 (L) 06/19/2015   LDLCALC 114 (H) 06/19/2015   LDLDIRECT 168.0 12/18/2012   TRIG 61.0 06/19/2015   CHOLHDL 4 06/19/2015  On Pravastatin 40 mg qod. - last eye exam was in 11/2015. No DR.  - no numbness and tingling in his feet.  Pt was admitted for CP in 11/2014: NSTEMI >> CAD >> PTCA >> stent placed. He was in cardiac rehab  >> finished.  I reviewed pt's medications, allergies, PMH, social hx, family hx, and changes were documented in the history of present illness. Otherwise, unchanged from my initial visit note.  ROS: Constitutional: no weight gain, no fatigue, no subjective hyperthermia/hypothermia Eyes: no blurry vision, no xerophthalmia ENT: no sore throat, no nodules palpated in throat, no dysphagia/odynophagia, no hoarseness Cardiovascular: no CP/SOB/palpitations/leg swelling Respiratory: no cough/SOB Gastrointestinal: no N/V/D/C Musculoskeletal: no muscle/ joint aches Skin: no rashes Neurological: no tremors/numbness/tingling/dizziness  PE: Pulse  76   Temp 97.7 F (36.5 C) (Oral)   Ht 5\' 10"  (1.778 m)   Wt 255 lb 6.4 oz (115.8 kg)   SpO2 97%   BMI 36.65 kg/m  Body mass index is 36.65 kg/m. Wt Readings from Last 3 Encounters:  03/25/16 255 lb 6.4 oz (115.8 kg)  02/12/16 255 lb (115.7 kg)  01/12/16 260 lb 6.4 oz (118.1 kg)   Constitutional: overweight, in NAD Eyes: PERRLA, EOMI, no exophthalmos ENT: moist mucous membranes, no thyromegaly, no cervical lymphadenopathy Cardiovascular: irreg irreg R, No MRG, + RLE edema (h/o knee surgery) Respiratory: CTA B Gastrointestinal: abdomen soft, NT, ND, BS+ Musculoskeletal: no deformities, strength intact in all 4 Skin: moist, warm, no rashes Neurological: no tremor with outstretched hands, DTR normal in all  4  ASSESSMENT: 1. DM2, insulin-dependent, uncontrolled, with complications - CAD, s/p NSTEMI, s/p stent 11/2014 - ED  PLAN:  1. Patient with long-standing, uncontrolled diabetes, with now worse control as he felt poorly on his previous heart regimen. Now this has been adjusted >> feels better and will start going back to the gym. - for now, as sugars are high at bedtime and in am >> will add 1/2 tab Glipizide before dinner - I suggested to:  Patient Instructions  Please continue: - Lantus 20 units at bedtime - Glipizide XL 5 mg in am - Metformin 1000 mg 2x a day with meals.  Add 1/2 a tablet of Glipizide before dinner for a regular dinner and 2x 1/2 tabs before a larger dinner.  Please return in 3 months with your sugar log.    - continue checking sugars at different times of the day - check 2 times a day, rotating checks - advised for yearly eye exams, he is up to date >> will send me the report - will check HbA1c today >> 7.8% (higher!) - Return to clinic in 3 mo with sugar log   [name], MD PhD [facility] Endocrinology

## 2016-04-02 ENCOUNTER — Telehealth: Payer: Self-pay | Admitting: Internal Medicine

## 2016-04-02 NOTE — Telephone Encounter (Signed)
Pt needs refills glipizide [facility]

## 2016-04-06 ENCOUNTER — Other Ambulatory Visit: Payer: Self-pay

## 2016-04-06 MED ORDER — GLIPIZIDE ER 5 MG PO TB24: 5.0000 mg | ORAL_TABLET | Freq: Every day | ORAL | 2 refills | Status: DC

## 2016-04-22 ENCOUNTER — Encounter: Payer: Self-pay | Admitting: *Deleted

## 2016-04-22 NOTE — Progress Notes (Unsigned)
TWILIGHT Research study month 18 follow up visit completed. Patient was placed on Plavix after the treatment period for TWILIGHT ended. Since I last spoke with patient he has restarted Brilinta 60 mg BID (no ASA). He started this on 13/NOV/2017. He denies any bleeding events. This is the last follow up for the study and I thanked him for participating.

## 2016-06-24 ENCOUNTER — Encounter: Payer: Self-pay | Admitting: Internal Medicine

## 2016-06-24 ENCOUNTER — Ambulatory Visit (INDEPENDENT_AMBULATORY_CARE_PROVIDER_SITE_OTHER): Payer: Federal, State, Local not specified - PPO | Admitting: Internal Medicine

## 2016-06-24 VITALS — BP 138/82 | HR 87 | Ht 69.0 in | Wt 259.0 lb

## 2016-06-24 DIAGNOSIS — E1159 Type 2 diabetes mellitus with other circulatory complications: Secondary | ICD-10-CM

## 2016-06-24 DIAGNOSIS — Z794 Long term (current) use of insulin: Secondary | ICD-10-CM | POA: Diagnosis not present

## 2016-06-24 LAB — POCT GLYCOSYLATED HEMOGLOBIN (HGB A1C): HEMOGLOBIN A1C: 8.4

## 2016-06-24 MED ORDER — SITAGLIPTIN PHOSPHATE 100 MG PO TABS: 100.0000 mg | ORAL_TABLET | Freq: Every day | ORAL | 5 refills | Status: DC

## 2016-06-24 NOTE — Progress Notes (Signed)
Patient ID: [name], male   DOB: 1950/05/14, 66 y.o.   MRN: [id number]  HPI: [name] is a 66 y.o.-year-old male, returning for f/u for DM2, dx 2007, insulin-dependent since 2014, uncontrolled, with complications (CAD - s/p NSTEMI 11/2014 - stent; ED). Last visit 3 mo ago. PCP: Dr Odette Fraction.  He has back pain and also increased urination >> up all night and eating.  Last hemoglobin A1c was: Lab Results  Component Value Date   HGBA1C 7.8% 03/25/2016   HGBA1C 7.1 12/25/2015   HGBA1C 7.1 09/25/2015   Pt is on a regimen of: - Metformin 1000 mg 2x a day - Glipizide XL 5 mg daily in am (decreased as he was having low CBGs in am) - did not add Glipizide before dinner as advised at last visit - Lantus (did not like Toujeo) 20 units at night (feels better on Lantus and it is more efficacious) He was Levemir 140 units in am >> decreased to 70 units>> stopped as this was too expensive for him He stopped Welchol 3.7g at bedtime a day - at last  Stopped Farxiga 10 mg b/c frequent urination and urgency. He was on Glipizide in the past. He was on Metformin in the past >> no N/V.   Pt checks his sugars 2x a day and they are: - am: 120-128 >> 72, 98-156 >> 71-150, 207 >> 106-150 >> 130-157 >> 157-159 - 2h after b'fast: 150s >> 102, 146, 171, 191 >> n/c >> 128, 134 >> n/c - before lunch:  n/c >> 120, 224-245 >> 206 >> n/c >> 140s >> n/c - 2h after lunch: n/c >> 144, 205 >> 120, 161 >> 115 >> n/c - before dinner: 140s >> n/c >> 104-130, 160, 200 >> 120s >> 109-119 >> 120-130 - 2h after dinner: n/c >> 143, 175 >> n/c >> 170-190 >> 200 - bedtime: n/c >> 105-165, 185 >> 99-170 >> 160s >> 151 >> 127-180, 200 >> n/c - nighttime: n/c >> 134, 210 >> 99-190 >> 94, 190-305 >> 148-216 >> n/c  No lows.Lowest sugar was 100 >> ; ? has hypoglycemia awareness  Highest sugar was 200s postmeal >>   - + mild CKD, last BUN/creatinine:  Lab Results  Component Value Date   BUN 18 11/11/2015   CREATININE  1.04 11/11/2015  On Losartan. - last set of lipids: Lab Results  Component Value Date   CHOL 164 06/19/2015   HDL 38.40 (L) 06/19/2015   LDLCALC 114 (H) 06/19/2015   LDLDIRECT 168.0 12/18/2012   TRIG 61.0 06/19/2015   CHOLHDL 4 06/19/2015  On Pravastatin 40 mg qod. - last eye exam was in 11/2015. No DR.  - no numbness and tingling in his feet.  Pt was admitted for CP in 11/2014: NSTEMI >> CAD >> PTCA >> stent placed. He was in cardiac rehab  >> finished.  I reviewed pt's medications, allergies, PMH, social hx, family hx, and changes were documented in the history of present illness. Otherwise, unchanged from my initial visit note.  ROS: Constitutional: + weight gain, + fatigue, no subjective hyperthermia/hypothermia, + nocturia Eyes: no blurry vision, no xerophthalmia ENT: no sore throat, no nodules palpated in throat, no dysphagia/odynophagia, no hoarseness Cardiovascular: no CP/SOB/palpitations/+ leg swelling Respiratory: no cough/SOB Gastrointestinal: no N/V/D/C Musculoskeletal: + muscle/+ joint aches Skin: no rashes Neurological: no tremors/numbness/tingling/dizziness  PE: BP 138/82 (BP Location: Left Arm, Patient Position: Sitting)   Pulse 87   Ht 5\' 9"  (1.753 m)   Wt  259 lb (117.5 kg)   SpO2 96%   BMI 38.25 kg/m  Body mass index is 38.25 kg/m. Wt Readings from Last 3 Encounters:  06/24/16 259 lb (117.5 kg)  03/25/16 255 lb 6.4 oz (115.8 kg)  02/12/16 255 lb (115.7 kg)   Constitutional: overweight, in NAD Eyes: PERRLA, EOMI, no exophthalmos ENT: moist mucous membranes, no thyromegaly, no cervical lymphadenopathy Cardiovascular: irreg irreg R, No MRG, + RLE edema (h/o knee surgery) Respiratory: CTA B Gastrointestinal: abdomen soft, NT, ND, BS+ Musculoskeletal: no deformities, strength intact in all 4 Skin: moist, warm, no rashes Neurological: no tremor with outstretched hands, DTR normal in all 4  ASSESSMENT: 1. DM2, insulin-dependent, uncontrolled, with  complications - CAD, s/p NSTEMI, s/p stent 11/2014 - ED  PLAN:  1. Patient with long-standing, uncontrolled diabetes, with now worse control >> less active and did not start Glipizide before dinner. - for now, as sugars are high at bedtime and in am >> will add 1/2 tab Glipizide before dinner and also add Januvia. - I suggested to:  Patient Instructions  Please continue: - Lantus 20 units at bedtime - Glipizide XL 5 mg in am. Add 1/2 a tablet of Glipizide before dinner. - Metformin 1000 mg 2x a day with meals  Please start: - Januvia 100 mg before b'fast  Please return in 1.5 months with your sugar log.    - continue checking sugars at different times of the day - check 2 times a day, rotating checks - advised for yearly eye exams, he is up to date  - will check HbA1c today >> 8.4% (higher!) - Return to clinic in 1.5 mo with sugar log   [name], MD PhD [facility] Endocrinology

## 2016-06-24 NOTE — Addendum Note (Signed)
Addended by: Caprice Beaver T on: 06/24/2016 04:22 PM   Modules accepted: Orders

## 2016-06-24 NOTE — Patient Instructions (Addendum)
Please continue: - Lantus 20 units at bedtime - Glipizide XL 5 mg in am. Add 1/2 a tablet of Glipizide before dinner. - Metformin 1000 mg 2x a day with meals  Please start: - Januvia 100 mg before b'fast  Please return in 1.5 months with your sugar log.

## 2016-06-29 DIAGNOSIS — R600 Localized edema: Secondary | ICD-10-CM | POA: Diagnosis not present

## 2016-06-29 DIAGNOSIS — L988 Other specified disorders of the skin and subcutaneous tissue: Secondary | ICD-10-CM | POA: Diagnosis not present

## 2016-06-29 DIAGNOSIS — Z6837 Body mass index (BMI) 37.0-37.9, adult: Secondary | ICD-10-CM | POA: Diagnosis not present

## 2016-06-29 DIAGNOSIS — M545 Low back pain: Secondary | ICD-10-CM | POA: Diagnosis not present

## 2016-06-29 DIAGNOSIS — N3281 Overactive bladder: Secondary | ICD-10-CM | POA: Diagnosis not present

## 2016-06-29 DIAGNOSIS — M47817 Spondylosis without myelopathy or radiculopathy, lumbosacral region: Secondary | ICD-10-CM | POA: Diagnosis not present

## 2016-07-07 ENCOUNTER — Encounter: Payer: Self-pay | Admitting: Physician Assistant

## 2016-07-12 ENCOUNTER — Other Ambulatory Visit: Payer: Self-pay

## 2016-07-12 ENCOUNTER — Encounter: Payer: Self-pay | Admitting: Internal Medicine

## 2016-07-12 MED ORDER — GLUCOSE BLOOD VI STRP: ORAL_STRIP | 5 refills | Status: DC

## 2016-07-12 MED ORDER — RELION PREMIER BLU MONITOR DEVI: 1.0000 | Freq: Every day | 0 refills | Status: DC

## 2016-07-20 ENCOUNTER — Telehealth: Payer: Self-pay | Admitting: Interventional Cardiology

## 2016-07-20 DIAGNOSIS — M47817 Spondylosis without myelopathy or radiculopathy, lumbosacral region: Secondary | ICD-10-CM | POA: Diagnosis not present

## 2016-07-20 NOTE — Telephone Encounter (Signed)
Per cath report 11/2014:  Stent Promus Prem Mr 6.34Z49 - [id number] - Implanted    Inventory item: Stent Promus Prem Mr 2.75x16 Model/Cat number: [id number]  Manufacturer: [employer] Lot number: [id number]  Device identifier: [id number] Device identifier type: GS1   Will route to Dr. Tamala Julian to see if ok for pt to have MRI.

## 2016-07-20 NOTE — Telephone Encounter (Signed)
New message      Pt has stents and need an MRI.  Calling to get name of stents or a copy of his card.  Pt did not have this info.  Please fax stent card to [phone].

## 2016-07-21 NOTE — Telephone Encounter (Signed)
By the name of the stent. Rely on the radiologist to determine if it is safe to perform MRI with the particular stent.

## 2016-07-21 NOTE — Telephone Encounter (Signed)
Faxed to [employer].

## 2016-07-23 DIAGNOSIS — M545 Low back pain: Secondary | ICD-10-CM | POA: Diagnosis not present

## 2016-08-03 DIAGNOSIS — M47817 Spondylosis without myelopathy or radiculopathy, lumbosacral region: Secondary | ICD-10-CM | POA: Diagnosis not present

## 2016-08-03 DIAGNOSIS — M545 Low back pain: Secondary | ICD-10-CM | POA: Diagnosis not present

## 2016-08-03 DIAGNOSIS — M48061 Spinal stenosis, lumbar region without neurogenic claudication: Secondary | ICD-10-CM | POA: Diagnosis not present

## 2016-08-10 ENCOUNTER — Encounter: Payer: Self-pay | Admitting: Interventional Cardiology

## 2016-08-14 ENCOUNTER — Encounter: Payer: Self-pay | Admitting: Internal Medicine

## 2016-08-18 ENCOUNTER — Encounter: Payer: Self-pay | Admitting: Interventional Cardiology

## 2016-08-18 ENCOUNTER — Other Ambulatory Visit: Payer: Self-pay | Admitting: Physician Assistant

## 2016-08-18 DIAGNOSIS — Z125 Encounter for screening for malignant neoplasm of prostate: Secondary | ICD-10-CM | POA: Diagnosis not present

## 2016-08-18 DIAGNOSIS — I10 Essential (primary) hypertension: Secondary | ICD-10-CM | POA: Diagnosis not present

## 2016-08-18 DIAGNOSIS — E1151 Type 2 diabetes mellitus with diabetic peripheral angiopathy without gangrene: Secondary | ICD-10-CM | POA: Diagnosis not present

## 2016-08-19 ENCOUNTER — Other Ambulatory Visit: Payer: Self-pay | Admitting: *Deleted

## 2016-08-19 MED ORDER — NITROGLYCERIN 0.4 MG SL SUBL: 0.4000 mg | SUBLINGUAL_TABLET | SUBLINGUAL | 1 refills | Status: DC | PRN

## 2016-08-20 DIAGNOSIS — T888 Other specified complications of surgical and medical care, not elsewhere classified: Secondary | ICD-10-CM | POA: Diagnosis not present

## 2016-08-20 DIAGNOSIS — H40013 Open angle with borderline findings, low risk, bilateral: Secondary | ICD-10-CM | POA: Diagnosis not present

## 2016-08-20 DIAGNOSIS — E119 Type 2 diabetes mellitus without complications: Secondary | ICD-10-CM | POA: Diagnosis not present

## 2016-08-20 DIAGNOSIS — H524 Presbyopia: Secondary | ICD-10-CM | POA: Diagnosis not present

## 2016-08-24 DIAGNOSIS — E1151 Type 2 diabetes mellitus with diabetic peripheral angiopathy without gangrene: Secondary | ICD-10-CM | POA: Diagnosis not present

## 2016-08-24 DIAGNOSIS — Z9861 Coronary angioplasty status: Secondary | ICD-10-CM | POA: Diagnosis not present

## 2016-08-24 DIAGNOSIS — Z6837 Body mass index (BMI) 37.0-37.9, adult: Secondary | ICD-10-CM | POA: Diagnosis not present

## 2016-08-24 DIAGNOSIS — I208 Other forms of angina pectoris: Secondary | ICD-10-CM | POA: Diagnosis not present

## 2016-08-24 DIAGNOSIS — G4733 Obstructive sleep apnea (adult) (pediatric): Secondary | ICD-10-CM | POA: Diagnosis not present

## 2016-08-24 DIAGNOSIS — F3189 Other bipolar disorder: Secondary | ICD-10-CM | POA: Diagnosis not present

## 2016-08-24 DIAGNOSIS — Z794 Long term (current) use of insulin: Secondary | ICD-10-CM | POA: Diagnosis not present

## 2016-08-24 DIAGNOSIS — Z0000 Encounter for general adult medical examination without abnormal findings: Secondary | ICD-10-CM | POA: Diagnosis not present

## 2016-08-24 DIAGNOSIS — I119 Hypertensive heart disease without heart failure: Secondary | ICD-10-CM | POA: Diagnosis not present

## 2016-08-24 DIAGNOSIS — D692 Other nonthrombocytopenic purpura: Secondary | ICD-10-CM | POA: Diagnosis not present

## 2016-08-24 DIAGNOSIS — E7800 Pure hypercholesterolemia, unspecified: Secondary | ICD-10-CM | POA: Diagnosis not present

## 2016-08-24 DIAGNOSIS — Z1389 Encounter for screening for other disorder: Secondary | ICD-10-CM | POA: Diagnosis not present

## 2016-08-26 DIAGNOSIS — Z1212 Encounter for screening for malignant neoplasm of rectum: Secondary | ICD-10-CM | POA: Diagnosis not present

## 2016-09-02 ENCOUNTER — Ambulatory Visit: Payer: Federal, State, Local not specified - PPO | Admitting: Internal Medicine

## 2016-09-06 ENCOUNTER — Encounter: Payer: Self-pay | Admitting: Internal Medicine

## 2016-09-06 ENCOUNTER — Telehealth: Payer: Self-pay | Admitting: *Deleted

## 2016-09-06 ENCOUNTER — Encounter: Payer: Self-pay | Admitting: Interventional Cardiology

## 2016-09-06 NOTE — Telephone Encounter (Signed)
Agree with recommendation. Medication regimen does not contain an Ace inhibitor. Follow-up with PCP

## 2016-09-06 NOTE — Telephone Encounter (Signed)
Copied from [place] message 09/06/16:  I have recently[ month] developed a cough that produces a clear sticky substance. it is not constant but it is notable every few mins. to clear my throat and upper lungs its becoming more frequent   09/06/16 Pt states he has gradually developed cough in the last month, coughing up clear sticky substance, almost constant.  Pt states he thinks it may be allergy related and has been using Zyrtec prn. Pt denies any other symptoms of respiratory infection or virus. Pt states he recently discussed with his PCP, PCP did not have any new recommendations. Pt denies increase in weight, LE edema, increase in shortness of breath or  decrease in exercise tolerance. Pt is able to ride his bike downtown everyday without problems.   Pt advised to continue to use Zyrtec regularly, monitor symptoms to see if improvement with Zyrtec, contact PCP if symptoms persist.  Pt advised I will forward to Dr Tamala Julian for review.

## 2016-09-06 NOTE — Telephone Encounter (Signed)
LMTCB

## 2016-09-07 NOTE — Telephone Encounter (Signed)
Left detailed message with recommendations from Dr. Tamala Julian.  Ok per [employer].  Advised to call back if any questions.

## 2016-09-09 ENCOUNTER — Ambulatory Visit (INDEPENDENT_AMBULATORY_CARE_PROVIDER_SITE_OTHER): Payer: Federal, State, Local not specified - PPO | Admitting: Internal Medicine

## 2016-09-09 ENCOUNTER — Encounter: Payer: Self-pay | Admitting: Internal Medicine

## 2016-09-09 VITALS — BP 138/90 | HR 64 | Wt 259.0 lb

## 2016-09-09 DIAGNOSIS — E1159 Type 2 diabetes mellitus with other circulatory complications: Secondary | ICD-10-CM | POA: Diagnosis not present

## 2016-09-09 DIAGNOSIS — Z794 Long term (current) use of insulin: Secondary | ICD-10-CM

## 2016-09-09 LAB — HEMOGLOBIN A1C: HEMOGLOBIN A1C: 7.9

## 2016-09-09 NOTE — Patient Instructions (Addendum)
You can stop Lantus for now.  Please continue: - Metformin 1000 mg 2x a day - Glipizide XL 5 mg 2x a day, but break/crush the evening dose - 15-30 min before a meal  If sugars after dinner are not better, then try to fill the prescription for Januvia 100 mg and try to take it before b'fast.  Please come back for a follow-up appointment in 3 months.

## 2016-09-09 NOTE — Progress Notes (Addendum)
Patient ID: [name], male   DOB: Mar 23, 1951, 66 y.o.   MRN: [id number]  HPI: [name] is a 66 y.o.-year-old male, returning for f/u for DM2, dx 2007, insulin-dependent since 2014, uncontrolled, with complications (CAD - s/p NSTEMI 11/2014 - stent; ED). Last visit 2.5 mo ago. PCP: Dr Odette Fraction.  Last hemoglobin A1c was: Lab Results  Component Value Date   HGBA1C 8.4 06/24/2016   HGBA1C 7.8% 03/25/2016   HGBA1C 7.1 12/25/2015   Pt is on a regimen of: - Metformin 1000 mg 2x a day - Glipizide XL 5 mg 2x a day (does not crush it!) - Lantus (did not like Toujeo) 20 units at night >> off Lantus as he feels sugars are better in am w/o it He did not start Januvia as suggested at last visit ("I don't think I need it"). He was Levemir 140 units in am >> decreased to 70 units>> stopped as this was too expensive for him He stopped Welchol 3.7g at bedtime a day. Stopped Farxiga 10 mg b/c frequent urination and urgency. He was on Glipizide in the past. He was on Metformin in the past >> no N/V.   Pt checks his sugars 2x a day: - am: 71-150, 207 >> 106-150 >> 130-157 >> 157-159 >> (w/o Lantus) 130-150 - 2h after b'fast: 150s >> 102, 146, 171, 191 >> n/c >> 128, 134 >> n/c - before lunch:  n/c >> 120, 224-245 >> 206 >> n/c >> 140s >> n/c - 2h after lunch: n/c >> 144, 205 >> 120, 161 >> 115 >> n/c >> 140-157 - before dinner:  104-130, 160, 200 >> 120s >> 109-119 >> 120-130 >> 106-120 - 2h after dinner: n/c >> 143, 175 >> n/c >> 170-190 >> 200 >> 200-245 - bedtime: 105-165, 185 >> 99-170 >> 160s >> 151 >> 127-180, 200 >> n/c - nighttime: n/c >> 134, 210 >> 99-190 >> 94, 190-305 >> 148-216 >> n/c  No lows.Lowest sugar was 100 >> 106 >> 86; ? has hypoglycemia awareness  Highest sugar was 200s postmeal >> 275.  - + mild CKD, last BUN/creatinine:  09/07/2016: 13/0.9, glucose 133 Lab Results  Component Value Date   BUN 18 11/11/2015   CREATININE 1.04 11/11/2015  On Losartan. - last set  of lipids: 09/07/2016: 167/65/33/121 Lab Results  Component Value Date   CHOL 164 06/19/2015   HDL 38.40 (L) 06/19/2015   LDLCALC 114 (H) 06/19/2015   LDLDIRECT 168.0 12/18/2012   TRIG 61.0 06/19/2015   CHOLHDL 4 06/19/2015  On Pravastatin 40 mg every day.  - last eye exam was in Spring 2018. Reportedly no DR.  - No numbness and tingling in his feet.  Pt was admitted for CP in 11/2014: NSTEMI >> CAD >> PTCA >> stent placed.   I reviewed pt's medications, allergies, PMH, social hx, family hx, and changes were documented in the history of present illness. Otherwise, unchanged from my initial visit note.  ROS: Constitutional: no weight gain/no weight loss, no fatigue, no subjective hyperthermia, no subjective hypothermia Eyes: no blurry vision, no xerophthalmia ENT: no sore throat, no nodules palpated in throat, no dysphagia, no odynophagia, no hoarseness Cardiovascular: no CP/no SOB/no palpitations/no leg swelling Respiratory: no cough/no SOB/no wheezing Gastrointestinal: no N/no V/no D/no C/no acid reflux Musculoskeletal: no muscle aches/no joint aches Skin: no rashes, no hair loss Neurological: no tremors/no numbness/no tingling/no dizziness  I reviewed pt's medications, allergies, PMH, social hx, family hx, and changes were documented in the history  of present illness. Otherwise, unchanged from my initial visit note.  PE: BP 138/90 (BP Location: Left Arm, Patient Position: Sitting)   Pulse 64   Wt 259 lb (117.5 kg)   SpO2 96%   BMI 38.25 kg/m  Body mass index is 38.25 kg/m. Wt Readings from Last 3 Encounters:  09/09/16 259 lb (117.5 kg)  06/24/16 259 lb (117.5 kg)  03/25/16 255 lb 6.4 oz (115.8 kg)   Constitutional: overweight, in NAD Eyes: PERRLA, EOMI, no exophthalmos ENT: moist mucous membranes, no thyromegaly, no cervical lymphadenopathy Cardiovascular: RRR, No MRG Respiratory: CTA B Gastrointestinal: abdomen soft, NT, ND, BS+ Musculoskeletal: no deformities,  strength intact in all 4 Skin: moist, warm, no rashes Neurological: no tremor with outstretched hands, DTR normal in all 4  ASSESSMENT: 1. DM2, insulin-dependent, uncontrolled, with complications - CAD, s/p NSTEMI, s/p stent 11/2014 - ED  PLAN:  1. Patient with long-standing, uncontrolled diabetes, with now better control after he started to be more active since last visit, he stopped the Lantus, and doubled his glipizide XL. At last visit, I advised him to start Januvia, but he did not start it because he did not think he needed it. He did not break or crush the glipizide before dinner, and as a consequence, his post-dinner sugars are still high but they are greatly improved in a.m. Due to risk of hypoglycemia overnight, I advised him to try to crush the glipizide XL and take it before dinner but we also discussed about ways to decrease post dinner blood sugars for example to stop eating [employer] or other fried food every night. He was wondering whether he did not need mealtime insulin before dinner, and he may, but I do not feel that this is mandatory now. I did advise him that if regular glipizide before dinner is not enough to fill the prescription for Januvia and start taking it. He agrees with the plan. - I suggested to:  Patient Instructions  You can stop Lantus for now.  Please continue: - Metformin 1000 mg 2x a day - Glipizide XL 5 mg 2x a day, but break/crush the evening dose - 15-30 min before a meal  If sugars after dinner are not better, then try to fill the prescription for Januvia 100 mg and try to take it before b'fast.  Please come back for a follow-up appointment in 3 months.  - today, HbA1c is 7.9% (slightly better) - continue checking sugars at different times of the day - check 2x a day, rotating checks - advised for yearly eye exams >> he is UTD - Return to clinic in 3 mo with sugar log   [name], MD PhD [facility] Endocrinology

## 2016-09-21 ENCOUNTER — Other Ambulatory Visit: Payer: Self-pay | Admitting: Interventional Cardiology

## 2016-09-21 MED ORDER — METOPROLOL TARTRATE 25 MG PO TABS: ORAL_TABLET | ORAL | 1 refills | Status: DC

## 2016-09-21 NOTE — Addendum Note (Signed)
Addended by: Derl Barrow on: 09/21/2016 10:22 AM   Modules accepted: Orders

## 2016-09-23 ENCOUNTER — Other Ambulatory Visit: Payer: Self-pay | Admitting: Internal Medicine

## 2016-09-23 ENCOUNTER — Encounter: Payer: Self-pay | Admitting: Interventional Cardiology

## 2016-09-25 ENCOUNTER — Encounter: Payer: Self-pay | Admitting: Interventional Cardiology

## 2016-09-28 NOTE — Progress Notes (Signed)
Cardiology Office Note    Date:  09/30/2016   ID:  Ronald, Brock 20-Dec-1950, MRN [id number]  PCP:  [name], MD  Cardiologist: [name], MD   Chief Complaint  Patient presents with  . Coronary Artery Disease    History of Present Illness:  [name] is a 66 y.o. male  hx of CAD s/p DES to LCx 11/2014, OM1 not stented due to inability of stent to track (likely due to wire tangle), HTN, HLD, OSA not on CAPA and PAT/PAF (not on anticoagulation due to low burden) who resented for evaluation of dyspnea on exertion.   He is doing okay. He had lower extremity swelling on amlodipine. PCI on the obtuse marginal branch was done in August 2016. We attempted angioplasty on a more distal location in the circumflex territory in January 2017 and was unsuccessful. He is very active now without angina. He is not needing to use nitroglycerin. Insulin has been discontinued. Diabetes control is improving.  Past Medical History:  Diagnosis Date  . Adenomatous colon polyp 2011  . Anemia   . Angina pectoris ([place]) 05/01/2015   med rx for 95% OM (u/a to access due to tortuosity), 95% D1 and other moderate CAD at cath  . Arthritis    "back, knees" (05/01/2015)  . Chest pain    pleuritic  . Chronic lower back pain   . Coronary artery disease   . Depression   . GERD (gastroesophageal reflux disease)   . HTN (hypertension)   . Hyperlipidemia   . Obstructive sleep apnea    noncompliant with CPAP  . Osteoarthritis   . Pneumonia ~ 2014 X 1  . Refusal of blood transfusions as patient is Jehovah's Witness   . Type II or unspecified type diabetes mellitus without mention of complication, not stated as uncontrolled     Past Surgical History:  Procedure Laterality Date  . CARDIAC CATHETERIZATION  2004   patent coronary arteries  . CARDIAC CATHETERIZATION  05/01/2015   "tried to put stent in but couldn't"  . CARDIAC CATHETERIZATION N/A 05/01/2015   Procedure: Left Heart Cath  and Coronary Angiography;  Surgeon: Belva Crome, MD; LAD 60%, oD1 90%, pD1 70%, D2 70%, CFX patent stent, 30% distal to prev stent, pRCA 20%, OM1 90/95%, NL LV  . CARDIAC CATHETERIZATION N/A 05/01/2015   Procedure: Coronary Stent Intervention;  Surgeon: Belva Crome, MD;  Unsuccessful PCI OM due to tortuosity  . CARDIAC CATHETERIZATION N/A 11/06/2014   Procedure: Left Heart Cath and Coronary Angiography;  Surgeon: Belva Crome, MD;  Location: [facility];  Service: Cardiovascular;  Laterality: N/A;  . CLOSED REDUCTION SHOULDER DISLOCATION Right ~ 1975   "& reattached muscle"  . COLONOSCOPY W/ BIOPSIES AND POLYPECTOMY  X 2  . ESOPHAGOGASTRODUODENOSCOPY ENDOSCOPY    . PILONIDAL CYST EXCISION      Current Medications: Outpatient Medications Prior to Visit  Medication Sig Dispense Refill  . Blood Glucose Monitoring Suppl (RELION PREMIER BLU MONITOR) DEVI 1 Device by Does not apply route daily. 1 Device 0  . esomeprazole (NEXIUM) 40 MG capsule Take 40 mg by mouth daily as needed (heartburn or acid reflux).    Marland Kitchen glipiZIDE (GLIPIZIDE XL) 5 MG 24 hr tablet Take 1 tablet (5 mg total) by mouth daily with breakfast. 90 tablet 2  . glucose blood test strip Use as instructed to check sugar 2 times daily 200 each 5  . Insulin Pen  Needle (NOVOFINE PLUS) 32G X 4 MM MISC Use 1x a day 100 each 11  . isosorbide mononitrate (IMDUR) 60 MG 24 hr tablet Take 1 tablet (60 mg total) by mouth daily. 30 tablet 11  . latanoprost (XALATAN) 0.005 % ophthalmic solution Place 1 drop into both eyes at bedtime.  5  . losartan-hydrochlorothiazide (HYZAAR) 100-12.5 MG tablet Take 1 tablet by mouth daily. 30 tablet 11  . metFORMIN (GLUCOPHAGE) 500 MG tablet TAKE 2 TABLETS (1,000 MG TOTAL) BY MOUTH 2 (TWO) TIMES DAILY WITH A MEAL. 360 tablet 0  . metoprolol tartrate (LOPRESSOR) 25 MG tablet TAKE  25 MG BY MOUTH IN AM AND TAKE 12.5 MG BY MOUTH IN PM 135 tablet 1  . nitroGLYCERIN (NITROSTAT) 0.4 MG SL tablet Place 1 tablet  (0.4 mg total) under the tongue every 5 (five) minutes x 3 doses as needed. (CHEST PAIN) 25 tablet 1  . pravastatin (PRAVACHOL) 40 MG tablet Take 40 mg by mouth daily.  3  . tiZANidine (ZANAFLEX) 4 MG tablet Take 4 mg by mouth every 8 (eight) hours as needed. (MUSCLE SPASMS)  2  . traZODone (DESYREL) 50 MG tablet Take 1 tablet (50 mg total) by mouth at bedtime as needed for sleep. 30 tablet 3  . ticagrelor (BRILINTA) 60 MG TABS tablet Take 1 tablet (60 mg total) by mouth 2 (two) times daily. 180 tablet 1  . amLODipine (NORVASC) 5 MG tablet Take 1 tablet (5 mg total) by mouth daily. 90 tablet 3  . metFORMIN (GLUCOPHAGE) 500 MG tablet TAKE 2 TABLETS (1,000 MG TOTAL) BY MOUTH 2 (TWO) TIMES DAILY WITH A MEAL. 120 tablet 0   No facility-administered medications prior to visit.      Allergies:   Statins   Social History   Social History  . Marital status: Married    Spouse name: N/A  . Number of children: 4  . Years of education: N/A   Occupational History  . Retired Unemployed   Social History Main Topics  . Smoking status: Former Smoker    Packs/day: 0.50    Years: 10.00    Types: Cigarettes    Quit date: 04/06/1983  . Smokeless tobacco: Never Used  . Alcohol use 0.0 oz/week     Comment: `/26/2017 "might drink a beer q couple months mostly; summertime I might drink 2-3 beers/week"  . Drug use: No  . Sexual activity: Not Currently   Other Topics Concern  . None   Social History Narrative   Lives in [place] with spouse [name]).  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     Family History:  The patient's family history includes Alcohol abuse in his other; Colon cancer in his paternal uncle; Coronary artery disease in his mother and other; Diabetes in his maternal uncle, mother, paternal uncle, and sister; Heart disease in his mother; Stroke in his mother.   ROS:   Please see the history of present illness.    Has some  difficulty with ankle swelling. This is markedly improved off amlodipine which he discontinued on his own. Blood pressures have been reasonable despite amlodipine discontinuation.  All other systems reviewed and are negative.   PHYSICAL EXAM:   VS:  BP (!) 142/84 (BP Location: Left Arm)   Pulse 65   Ht 5\' 10"  (1.778 m)   Wt 256 lb 6.4 oz (116.3 kg)   BMI 36.79 kg/m    GEN: Well  nourished, well developed, in no acute distress  HEENT: normal  Neck: no JVD, carotid bruits, or masses Cardiac: RRR; no murmurs, rubs, or gallops,no edema  Respiratory:  clear to auscultation bilaterally, normal work of breathing GI: soft, nontender, nondistended, + BS MS: no deformity or atrophy  Skin: warm and dry, no rash Neuro:  Alert and Oriented x 3, Strength and sensation are intact Psych: euthymic mood, full affect  Wt Readings from Last 3 Encounters:  09/30/16 256 lb 6.4 oz (116.3 kg)  09/09/16 259 lb (117.5 kg)  06/24/16 259 lb (117.5 kg)      Studies/Labs Reviewed:   EKG:  EKG  Not repeated.  Recent Labs: 11/11/2015: BUN 18; Creat 1.04; Hemoglobin 13.6; Platelets 240; Potassium 4.3; Sodium 142; TSH 1.52 02/12/2016: Brain Natriuretic Peptide 24.8   Lipid Panel    Component Value Date/Time   CHOL 164 06/19/2015 1624   TRIG 61.0 06/19/2015 1624   HDL 38.40 (L) 06/19/2015 1624   CHOLHDL 4 06/19/2015 1624   VLDL 12.2 06/19/2015 1624   LDLCALC 114 (H) 06/19/2015 1624   LDLDIRECT 168.0 12/18/2012 1340    Additional studies/ records that were reviewed today include:  No new data    ASSESSMENT:    1. Coronary artery disease involving coronary bypass graft of native heart with angina pectoris ([place])   2. PAT (paroxysmal atrial tachycardia) ([place])   3. Type 2 diabetes mellitus with other circulatory complication, with long-term current use of insulin ([place])   4. Obstructive sleep apnea   5. Hyperlipidemia with target LDL less than 100   6. Peripheral edema      PLAN:  In order of  problems listed above:  1. Stable without angina. Hopefully he is developing collaterals to the marginal branch that we could not intervene upon. He is not experiencing angina. Continue current medical therapy. Discontinue Brilinta. 2.  Asymptomatic with no known recurrences. 3. Hemoglobin A1c less than 6. 4. I get the feeling he is not using continuous airway pressures we have recommended. I encouraged that he reconsider therapy. 5. LDL cholesterol target less than 70. Last LDL was greater than 110. He is intolerant of statin therapy. Recommended lipid clinic but he did not pursue. 6. Stay off amlodipine as edema has resolved with therapy discontinuation. The patient did this independently.  Continue aggressive management of risk factors. His been difficult because he has intolerance to important therapies. Aerobic activity. Weight loss. Call if clinical symptoms of angina. Discontinue Brilinta.  Medication Adjustments/Labs and Tests Ordered: Current medicines are reviewed at length with the patient today.  Concerns regarding medicines are outlined above.  Medication changes, Labs and Tests ordered today are listed in the Patient Instructions below. Patient Instructions  Medication Instructions:  1) STOP Amlodipine 2) STOP Brilinta  Labwork: None  Testing/Procedures: None  Follow-Up: Your physician wants you to follow-up in: 1 year with Dr. Tamala Julian. You will receive a reminder letter in the mail two months in advance. If you don't receive a letter, please call our office to schedule the follow-up appointment.   Any Other Special Instructions Will Be Listed Below (If Applicable).  Monitor your blood pressure off of the Amlodipine and let us know if it is consistently 140/90 or higher. Your physician discussed the importance of regular exercise and recommended that you start or continue a regular exercise program for good health.    If you need a refill on your cardiac medications  before your next appointment, please call your pharmacy.  Signed, [name], MD  09/30/2016 9:45 AM    [facility] [place], [place], [place]  [address] Phone: [phone]; Fax: [phone]

## 2016-09-30 ENCOUNTER — Ambulatory Visit (INDEPENDENT_AMBULATORY_CARE_PROVIDER_SITE_OTHER): Payer: Federal, State, Local not specified - PPO | Admitting: Interventional Cardiology

## 2016-09-30 ENCOUNTER — Encounter: Payer: Self-pay | Admitting: Interventional Cardiology

## 2016-09-30 VITALS — BP 142/84 | HR 65 | Ht 70.0 in | Wt 256.4 lb

## 2016-09-30 DIAGNOSIS — I25709 Atherosclerosis of coronary artery bypass graft(s), unspecified, with unspecified angina pectoris: Secondary | ICD-10-CM | POA: Diagnosis not present

## 2016-09-30 DIAGNOSIS — Z794 Long term (current) use of insulin: Secondary | ICD-10-CM | POA: Diagnosis not present

## 2016-09-30 DIAGNOSIS — R609 Edema, unspecified: Secondary | ICD-10-CM

## 2016-09-30 DIAGNOSIS — E785 Hyperlipidemia, unspecified: Secondary | ICD-10-CM | POA: Diagnosis not present

## 2016-09-30 DIAGNOSIS — I471 Supraventricular tachycardia: Secondary | ICD-10-CM | POA: Diagnosis not present

## 2016-09-30 DIAGNOSIS — G4733 Obstructive sleep apnea (adult) (pediatric): Secondary | ICD-10-CM

## 2016-09-30 DIAGNOSIS — E1159 Type 2 diabetes mellitus with other circulatory complications: Secondary | ICD-10-CM

## 2016-09-30 NOTE — Patient Instructions (Signed)
Medication Instructions:  1) STOP Amlodipine 2) STOP Brilinta  Labwork: None  Testing/Procedures: None  Follow-Up: Your physician wants you to follow-up in: 1 year with Dr. Tamala Julian. You will receive a reminder letter in the mail two months in advance. If you don't receive a letter, please call our office to schedule the follow-up appointment.   Any Other Special Instructions Will Be Listed Below (If Applicable).  Monitor your blood pressure off of the Amlodipine and let us know if it is consistently 140/90 or higher. Your physician discussed the importance of regular exercise and recommended that you start or continue a regular exercise program for good health.    If you need a refill on your cardiac medications before your next appointment, please call your pharmacy.

## 2016-10-11 ENCOUNTER — Ambulatory Visit: Payer: Federal, State, Local not specified - PPO | Admitting: Interventional Cardiology

## 2016-10-24 ENCOUNTER — Other Ambulatory Visit: Payer: Self-pay | Admitting: Internal Medicine

## 2016-10-30 ENCOUNTER — Other Ambulatory Visit: Payer: Self-pay | Admitting: Physician Assistant

## 2016-11-14 ENCOUNTER — Encounter: Payer: Self-pay | Admitting: Internal Medicine

## 2016-11-14 ENCOUNTER — Other Ambulatory Visit: Payer: Self-pay | Admitting: Internal Medicine

## 2016-11-15 ENCOUNTER — Other Ambulatory Visit: Payer: Self-pay

## 2016-11-15 MED ORDER — GLIPIZIDE ER 5 MG PO TB24: 5.0000 mg | ORAL_TABLET | Freq: Every day | ORAL | 2 refills | Status: DC

## 2016-11-16 ENCOUNTER — Other Ambulatory Visit: Payer: Self-pay

## 2016-11-16 MED ORDER — GLIPIZIDE ER 5 MG PO TB24: ORAL_TABLET | ORAL | 2 refills | Status: DC

## 2016-11-17 ENCOUNTER — Other Ambulatory Visit: Payer: Self-pay

## 2016-11-17 ENCOUNTER — Encounter: Payer: Self-pay | Admitting: Interventional Cardiology

## 2016-11-17 MED ORDER — GLIPIZIDE ER 5 MG PO TB24: ORAL_TABLET | ORAL | 2 refills | Status: DC

## 2016-11-18 ENCOUNTER — Telehealth: Payer: Self-pay | Admitting: *Deleted

## 2016-11-18 NOTE — Telephone Encounter (Signed)
-----   Message from Belva Crome, MD sent at 11/17/2016  8:44 AM EDT ----- Please arrange OV for APP or BP clinic to work out the complaint of cough and swelling relative to therapy.  My-Chart message to patient:  "This is a lot to work out by phone. As far as cough, stop Losartan/Hctz for 1 week. Please monitor BP. Will arrange f/u next week to go further.  Amlodipine is causing swelling. Can be dealt with after cough. "

## 2016-11-18 NOTE — Telephone Encounter (Signed)
Left message to call back on home phone.  Unable to leave message cell number as VM is not set up.

## 2016-11-19 NOTE — Telephone Encounter (Signed)
Left message to call back  

## 2016-11-22 NOTE — Telephone Encounter (Signed)
Spoke with pt and scheduled him to see [name], NP on Friday.  Pt appreciative for call.

## 2016-11-25 NOTE — Progress Notes (Signed)
Cardiology Office Note   Date:  11/26/2016   ID:  Ronald, Brock 12/23/1950, MRN [id number]  PCP:  [name], MD  Cardiologist:  Dr. Tamala Julian     Chief Complaint  Patient presents with  . Cough    possible to med      History of Present Illness: [name] is a 66 y.o. male who presents for cough and HTN and leg swelling.    He has a hx of CAD s/p DES to LCx 11/2014, OM1 not stented due to inability of stent to track (likely due to wire tangle), HTN, HLD, OSA not on CAPA and PAT/PAF (not on anticoagulation due to low burden) who resented for evaluation of dyspnea on exertion.   He is doing okay. He had lower extremity swelling on amlodipine. PCI on the obtuse marginal branch was done in August 2016. We attempted angioplasty on a more distal location in the circumflex territory in January 2017 and was unsuccessful. He is very active now without angina. He is not needing to use nitroglycerin. Insulin has been discontinued. Diabetes control is improving.  Pt called with cough, he felt was coming from his lungs.  Clear mucus.  Not severe cough.  No fever.  He sleeps propped up and does not use his CPAP.  He can ride his bike and has no SOB.  No SOB at all.   He had been instructed to stop ARB from a week but he did not see any difference.  So he is back taking.  BP is elevated. He tells me it has been.  Today he notes cough is improved.  No chest pain except after meals mild tightness. None riding his bike.   Pt was worried the BB was causing the cough.  We discussed side effects of BB.     Past Medical History:  Diagnosis Date  . Adenomatous colon polyp 2011  . Anemia   . Angina pectoris ([place]) 05/01/2015   med rx for 95% OM (u/a to access due to tortuosity), 95% D1 and other moderate CAD at cath  . Arthritis    "back, knees" (05/01/2015)  . Chest pain    pleuritic  . Chronic lower back pain   . Coronary artery disease   . Depression   . GERD (gastroesophageal  reflux disease)   . HTN (hypertension)   . Hyperlipidemia   . Obstructive sleep apnea    noncompliant with CPAP  . Osteoarthritis   . Pneumonia ~ 2014 X 1  . Refusal of blood transfusions as patient is Jehovah's Witness   . Type II or unspecified type diabetes mellitus without mention of complication, not stated as uncontrolled     Past Surgical History:  Procedure Laterality Date  . CARDIAC CATHETERIZATION  2004   patent coronary arteries  . CARDIAC CATHETERIZATION  05/01/2015   "tried to put stent in but couldn't"  . CARDIAC CATHETERIZATION N/A 05/01/2015   Procedure: Left Heart Cath and Coronary Angiography;  Surgeon: Belva Crome, MD; LAD 60%, oD1 90%, pD1 70%, D2 70%, CFX patent stent, 30% distal to prev stent, pRCA 20%, OM1 90/95%, NL LV  . CARDIAC CATHETERIZATION N/A 05/01/2015   Procedure: Coronary Stent Intervention;  Surgeon: Belva Crome, MD;  Unsuccessful PCI OM due to tortuosity  . CARDIAC CATHETERIZATION N/A 11/06/2014   Procedure: Left Heart Cath and Coronary Angiography;  Surgeon: Belva Crome, MD;  Location: [facility];  Service: Cardiovascular;  Laterality:  N/A;  . CLOSED REDUCTION SHOULDER DISLOCATION Right ~ 1975   "& reattached muscle"  . COLONOSCOPY W/ BIOPSIES AND POLYPECTOMY  X 2  . ESOPHAGOGASTRODUODENOSCOPY ENDOSCOPY    . PILONIDAL CYST EXCISION       Current Outpatient Prescriptions  Medication Sig Dispense Refill  . Blood Glucose Monitoring Suppl (RELION PREMIER BLU MONITOR) DEVI 1 Device by Does not apply route daily. 1 Device 0  . esomeprazole (NEXIUM) 40 MG capsule Take 40 mg by mouth daily as needed (heartburn or acid reflux).    Marland Kitchen glipiZIDE (GLIPIZIDE XL) 5 MG 24 hr tablet Take 1 tablet twice daily 90 tablet 2  . glucose blood test strip Use as instructed to check sugar 2 times daily 200 each 5  . Insulin Pen Needle (NOVOFINE PLUS) 32G X 4 MM MISC Use 1x a day 100 each 11  . isosorbide mononitrate (IMDUR) 60 MG 24 hr tablet Take 1 tablet  (60 mg total) by mouth daily. 30 tablet 11  . latanoprost (XALATAN) 0.005 % ophthalmic solution Place 1 drop into both eyes at bedtime.  5  . metFORMIN (GLUCOPHAGE) 500 MG tablet TAKE 2 TABLETS (1,000 MG TOTAL) BY MOUTH 2 (TWO) TIMES DAILY WITH A MEAL. 360 tablet 0  . metoprolol tartrate (LOPRESSOR) 25 MG tablet TAKE  25 MG BY MOUTH IN AM AND TAKE 12.5 MG BY MOUTH IN PM 135 tablet 1  . mirabegron ER (MYRBETRIQ) 25 MG TB24 tablet Take 25 mg by mouth daily.    . nitroGLYCERIN (NITROSTAT) 0.4 MG SL tablet Place 1 tablet (0.4 mg total) under the tongue every 5 (five) minutes x 3 doses as needed. (CHEST PAIN) 25 tablet 1  . pravastatin (PRAVACHOL) 40 MG tablet Take 40 mg by mouth daily.  3  . tiZANidine (ZANAFLEX) 4 MG tablet Take 4 mg by mouth every 8 (eight) hours as needed. (MUSCLE SPASMS)  2  . traZODone (DESYREL) 50 MG tablet Take 1 tablet (50 mg total) by mouth at bedtime as needed for sleep. 30 tablet 3  . losartan-hydrochlorothiazide (HYZAAR) 100-25 MG tablet Take 1 tablet by mouth daily. 90 tablet 3   No current facility-administered medications for this visit.     Allergies:   Statins    Social History:  The patient  reports that he quit smoking about 33 years ago. His smoking use included Cigarettes. He has a 5.00 pack-year smoking history. He has never used smokeless tobacco. He reports that he drinks alcohol. He reports that he does not use drugs.   Family History:  The patient's family history includes Alcohol abuse in his other; Colon cancer in his paternal uncle; Coronary artery disease in his mother and other; Diabetes in his maternal uncle, mother, paternal uncle, and sister; Heart disease in his mother; Stroke in his mother.    ROS:  General:? colds no fevers, no weight changes Skin:no rashes or ulcers HEENT:no blurred vision, no congestion CV:see HPI PUL:see HPI GI:no diarrhea constipation or melena, no indigestion GU:no hematuria, no dysuria MS:no joint pain, no  claudication Neuro:no syncope, no lightheadedness Endo:+ diabetes followed by PCP, no thyroid disease  Wt Readings from Last 3 Encounters:  11/26/16 255 lb (115.7 kg)  09/30/16 256 lb 6.4 oz (116.3 kg)  09/09/16 259 lb (117.5 kg)     PHYSICAL EXAM: VS:  BP (!) 150/88   Pulse 61   Ht 5\' 10"  (1.778 m)   Wt 255 lb (115.7 kg)   SpO2 98%   BMI 36.59 kg/m  ,  BMI Body mass index is 36.59 kg/m. General:Pleasant affect, NAD Skin:Warm and dry, brisk capillary refill HEENT:normocephalic, sclera clear, mucus membranes moist Neck:supple, no JVD, no bruits  Heart:S1S2 RRR without murmur, gallup, rub or click Lungs:clear without rales, rhonchi, or wheezes QJF:HLKT, non tender, + BS, do not palpate liver spleen or masses Ext:tr lower ext edema, 2+ pedal pulses, 2+ radial pulses Neuro:alert and oriented X 3, MAE, follows commands, + facial symmetry    EKG:  EKG is NOT ordered today.   Recent Labs: 02/12/2016: Brain Natriuretic Peptide 24.8    Lipid Panel    Component Value Date/Time   CHOL 164 06/19/2015 1624   TRIG 61.0 06/19/2015 1624   HDL 38.40 (L) 06/19/2015 1624   CHOLHDL 4 06/19/2015 1624   VLDL 12.2 06/19/2015 1624   LDLCALC 114 (H) 06/19/2015 1624   LDLDIRECT 168.0 12/18/2012 1340       Other studies Reviewed: Additional studies/ records that were reviewed today include: . Echo Study Conclusions  - Left ventricle: The cavity size was normal. Wall thickness was   increased in a pattern of mild LVH. Systolic function was normal.   The estimated ejection fraction was in the range of 55% to 60%.   Wall motion was normal; there were no regional wall motion   abnormalities. Doppler parameters are consistent with abnormal   left ventricular relaxation (grade 1 diastolic dysfunction). - Aorta: Aortic root dimension: 41 mm (ED). - Ascending aorta: The ascending aorta was mildly dilated. - Mitral valve: Moderately calcified annulus. There was systolic   anterior motion  of the chordal structures. - Left atrium: The atrium was moderately dilated.  Impressions:  - Compared to the prior study, there has been no significant   interval change.   ASSESSMENT AND PLAN:  1.  Cough do not believe due to ARB, pt is back on ARB and cough still improving.  He has no SOB.  He does have mild edema at ankles.  Reassured not HF or med reaction.  He will try Claritin to see if it will help. We discussed XRAY of chest and if symptoms return would pursue.  Follow up with Dr. Tamala Julian in Jan.   2.  HTN poorly controlled increase HCTZ to 25 mg and recheck in 2 weeks.  If still elevated would add hydralazine - pt had lower ext edema with amlodipine.   Also he does not tolerate higher doses of BB with bradycardia.  3.  CAD with no angina,  LCX DES stent now on ASA alone.  OM disease unable to stent.  4.  HLD  Intolerant to statin.  LDL 110  5.   Had PAF but no anticoagulation due to brief episode. Pt saw Dr. Rayann Heman.   6.  DM-2 per PCP he does take his meds.   7.  OSA does not use CPAP    Current medicines are reviewed with the patient today.  The patient Has no concerns regarding medicines.  The following changes have been made:  See above Labs/ tests ordered today include:see above  Disposition:   FU:  see above  Signed, [name], NP  11/26/2016 12:22 PM    [facility] [place], [address] [place], [place] Phone: [phone]; Fax: [phone]

## 2016-11-26 ENCOUNTER — Ambulatory Visit (INDEPENDENT_AMBULATORY_CARE_PROVIDER_SITE_OTHER): Payer: Federal, State, Local not specified - PPO | Admitting: Cardiology

## 2016-11-26 ENCOUNTER — Encounter: Payer: Self-pay | Admitting: Cardiology

## 2016-11-26 VITALS — BP 150/88 | HR 61 | Ht 70.0 in | Wt 255.0 lb

## 2016-11-26 DIAGNOSIS — I25709 Atherosclerosis of coronary artery bypass graft(s), unspecified, with unspecified angina pectoris: Secondary | ICD-10-CM | POA: Diagnosis not present

## 2016-11-26 DIAGNOSIS — E785 Hyperlipidemia, unspecified: Secondary | ICD-10-CM

## 2016-11-26 DIAGNOSIS — R059 Cough, unspecified: Secondary | ICD-10-CM

## 2016-11-26 DIAGNOSIS — G4733 Obstructive sleep apnea (adult) (pediatric): Secondary | ICD-10-CM

## 2016-11-26 DIAGNOSIS — Z794 Long term (current) use of insulin: Secondary | ICD-10-CM | POA: Diagnosis not present

## 2016-11-26 DIAGNOSIS — Z79899 Other long term (current) drug therapy: Secondary | ICD-10-CM

## 2016-11-26 DIAGNOSIS — R05 Cough: Secondary | ICD-10-CM | POA: Diagnosis not present

## 2016-11-26 DIAGNOSIS — E1159 Type 2 diabetes mellitus with other circulatory complications: Secondary | ICD-10-CM

## 2016-11-26 DIAGNOSIS — I2510 Atherosclerotic heart disease of native coronary artery without angina pectoris: Secondary | ICD-10-CM | POA: Diagnosis not present

## 2016-11-26 DIAGNOSIS — I480 Paroxysmal atrial fibrillation: Secondary | ICD-10-CM

## 2016-11-26 LAB — BASIC METABOLIC PANEL
BUN / CREAT RATIO: 15 (ref 10–24)
BUN: 14 mg/dL (ref 8–27)
CO2: 22 mmol/L (ref 20–29)
Calcium: 8.9 mg/dL (ref 8.6–10.2)
Chloride: 102 mmol/L (ref 96–106)
Creatinine, Ser: 0.91 mg/dL (ref 0.76–1.27)
GFR calc Af Amer: 101 mL/min/{1.73_m2} (ref >59)
GFR calc non Af Amer: 88 mL/min/{1.73_m2} (ref >59)
GLUCOSE: 155 mg/dL — AB (ref 65–99)
POTASSIUM: 4.3 mmol/L (ref 3.5–5.2)
SODIUM: 139 mmol/L (ref 134–144)

## 2016-11-26 MED ORDER — LOSARTAN POTASSIUM-HCTZ 100-25 MG PO TABS: 1.0000 | ORAL_TABLET | Freq: Every day | ORAL | 3 refills | Status: DC

## 2016-11-26 NOTE — Patient Instructions (Signed)
Your physician has recommended you make the following change in your medication: INCREASE HYZAAR TO 100/25 MG EVERY DAY   MAY TRY CLARITIN OTC AS  DIRECTED FOR COUGH  (ALLERGIES) Your physician recommends that you return for lab work in:  [place] physician recommends that you schedule a follow-up appointment in:  2 WEEKS IN B/P CLINIC  AND  January WITH DR Tamala Julian

## 2016-12-09 ENCOUNTER — Ambulatory Visit: Payer: Federal, State, Local not specified - PPO

## 2016-12-09 NOTE — Progress Notes (Deleted)
Patient ID: [name]                 DOB: 1950-07-05                      MRN: [id number]     HPI: [name] is a 66 y.o. male patient of Dr. Rayann Heman presented to HTN clinic today. PMH is significant for CAD s/p DES to LCx 11/2014, OM1 not stented due to inability of stent to track (likely due to wire tangle), HTN, HLD, DM, OSA not on CPAP and PAT/PAF (not on anticoagulation due to low clot burden).  He developed a cough and communicated this on 11/17/16, his Losartan-HCTZ combination pill was discontinued x1 week but then no improvement was seen so he was instructed to restart.   At 11/26/16 OV, blood pressure was uncontrolled at 150/88 mmHg, his HCTZ was increased 25 mg daily. He has a history of LEE on amlodipine, so discontinued 09/2016. Also noted, he does not tolerate higher doses of BB due to bradycardia. --- Ask about lower extremity swelling***  Ask about cough*** Ask about angina and NTG use *** Insulin was discontinued   Current HTN meds:  Imdur 60 mg daily Losartan-HCTZ 100-25mg  daily Lopressor 25 mg in AM and 12.5mg  in PM  Previously tried: amlodipine (LEE)  BP goal: <130/80 mmHg  Family History: The patient's family history includes Alcohol abuse in his other; Colon cancer in his paternal uncle; Coronary artery disease in his mother and other; Diabetes in his maternal uncle, mother, paternal uncle, and sister; Heart disease in his mother; Stroke in his mother.   Social History: The patient  reports that he quit smoking about 33 years ago. His smoking use included Cigarettes. He has a 5.00 pack-year smoking history. He has never used smokeless tobacco. He reports that he drinks alcohol. He reports that he does not use drugs.   Diet:   Exercise:   Home BP readings:   Wt Readings from Last 3 Encounters:  11/26/16 255 lb (115.7 kg)  09/30/16 256 lb 6.4 oz (116.3 kg)  09/09/16 259 lb (117.5 kg)   BP Readings from Last 3 Encounters:  11/26/16 (!) 150/88    09/30/16 (!) 142/84  09/09/16 138/90   Pulse Readings from Last 3 Encounters:  11/26/16 61  09/30/16 65  09/09/16 64    Renal function: Estimated Creatinine Clearance: 101.8 mL/min (by C-G formula based on SCr of 0.91 mg/dL).  Past Medical History:  Diagnosis Date  . Adenomatous colon polyp 2011  . Anemia   . Angina pectoris ([place]) 05/01/2015   med rx for 95% OM (u/a to access due to tortuosity), 95% D1 and other moderate CAD at cath  . Arthritis    "back, knees" (05/01/2015)  . Chest pain    pleuritic  . Chronic lower back pain   . Coronary artery disease   . Depression   . GERD (gastroesophageal reflux disease)   . HTN (hypertension)   . Hyperlipidemia   . Obstructive sleep apnea    noncompliant with CPAP  . Osteoarthritis   . Pneumonia ~ 2014 X 1  . Refusal of blood transfusions as patient is Jehovah's Witness   . Type II or unspecified type diabetes mellitus without mention of complication, not stated as uncontrolled     Current Outpatient Prescriptions on File Prior to Visit  Medication Sig Dispense Refill  . Blood Glucose Monitoring Suppl (RELION PREMIER BLU MONITOR) DEVI 1 Device  by Does not apply route daily. 1 Device 0  . esomeprazole (NEXIUM) 40 MG capsule Take 40 mg by mouth daily as needed (heartburn or acid reflux).    Marland Kitchen glipiZIDE (GLIPIZIDE XL) 5 MG 24 hr tablet Take 1 tablet twice daily 90 tablet 2  . glucose blood test strip Use as instructed to check sugar 2 times daily 200 each 5  . Insulin Pen Needle (NOVOFINE PLUS) 32G X 4 MM MISC Use 1x a day 100 each 11  . isosorbide mononitrate (IMDUR) 60 MG 24 hr tablet Take 1 tablet (60 mg total) by mouth daily. 30 tablet 11  . latanoprost (XALATAN) 0.005 % ophthalmic solution Place 1 drop into both eyes at bedtime.  5  . losartan-hydrochlorothiazide (HYZAAR) 100-25 MG tablet Take 1 tablet by mouth daily. 90 tablet 3  . metFORMIN (GLUCOPHAGE) 500 MG tablet TAKE 2 TABLETS (1,000 MG TOTAL) BY MOUTH 2 (TWO) TIMES  DAILY WITH A MEAL. 360 tablet 0  . metoprolol tartrate (LOPRESSOR) 25 MG tablet TAKE  25 MG BY MOUTH IN AM AND TAKE 12.5 MG BY MOUTH IN PM 135 tablet 1  . mirabegron ER (MYRBETRIQ) 25 MG TB24 tablet Take 25 mg by mouth daily.    . nitroGLYCERIN (NITROSTAT) 0.4 MG SL tablet Place 1 tablet (0.4 mg total) under the tongue every 5 (five) minutes x 3 doses as needed. (CHEST PAIN) 25 tablet 1  . pravastatin (PRAVACHOL) 40 MG tablet Take 40 mg by mouth daily.  3  . tiZANidine (ZANAFLEX) 4 MG tablet Take 4 mg by mouth every 8 (eight) hours as needed. (MUSCLE SPASMS)  2  . traZODone (DESYREL) 50 MG tablet Take 1 tablet (50 mg total) by mouth at bedtime as needed for sleep. 30 tablet 3   No current facility-administered medications on file prior to visit.     Allergies  Allergen Reactions  . Statins Other (See Comments)    REACTION: joint pain Lipitor- headaches Has also tried Livalo, pravachol, zetia, crestor, welchol     Assessment/Plan: Hypertension: Blood pressure was above goal today of 140/90 mmHg, at ***.  He is tolerating the increase in HCTZ well, so will continue his current regimen.   ***hydral TID   Diana L. Kyung Rudd, PharmD, [place] PGY1 Pharmacy Resident [place]

## 2016-12-10 ENCOUNTER — Encounter: Payer: Self-pay | Admitting: Internal Medicine

## 2016-12-10 ENCOUNTER — Ambulatory Visit (INDEPENDENT_AMBULATORY_CARE_PROVIDER_SITE_OTHER): Payer: Federal, State, Local not specified - PPO | Admitting: Internal Medicine

## 2016-12-10 VITALS — BP 142/88 | HR 67 | Ht 70.0 in | Wt 253.6 lb

## 2016-12-10 DIAGNOSIS — E1159 Type 2 diabetes mellitus with other circulatory complications: Secondary | ICD-10-CM

## 2016-12-10 DIAGNOSIS — T888 Other specified complications of surgical and medical care, not elsewhere classified: Secondary | ICD-10-CM | POA: Diagnosis not present

## 2016-12-10 DIAGNOSIS — IMO0001 Reserved for inherently not codable concepts without codable children: Secondary | ICD-10-CM

## 2016-12-10 DIAGNOSIS — E669 Obesity, unspecified: Secondary | ICD-10-CM

## 2016-12-10 LAB — POCT GLYCOSYLATED HEMOGLOBIN (HGB A1C): HEMOGLOBIN A1C: 7.4

## 2016-12-10 NOTE — Patient Instructions (Addendum)
Please continue: - Metformin 1000 mg 2x a day - Glipizide XL 5 mg 2x a day - break/crush the evening dose - 15-30 min before a meal  Please come back for a follow-up appointment in 3 months.

## 2016-12-10 NOTE — Progress Notes (Signed)
Patient ID: [name], male   DOB: Feb 28, 1951, 66 y.o.   MRN: [id number]  HPI: [name] is a 66 y.o.-year-old male, returning for f/u for DM2, dx 2007, insulin-dependent since 2014, uncontrolled, with complications (CAD - s/p NSTEMI 11/2014 - stent; ED). Last visit 3 mo ago. PCP: Dr Odette Fraction.  In last 3 weeks >> more sweets ("my wife brings candy in the house"). He did try to work on his dinner and is trying to reduce his fried food intake. He his wife still eat a lot of [employer].  Last hemoglobin A1c was: Lab Results  Component Value Date   HGBA1C 8.4 06/24/2016   HGBA1C 7.8% 03/25/2016   HGBA1C 7.1 12/25/2015   Pt is on a regimen of: - Metformin 1000 mg 2x a day - Glipizide XL 5 mg 2x a day - Second dose of the day crushed He stopped Lantus as he mentions that his sugars in a.m. were better without it  He tried Toujeo 20 units at night but did not like it He did not start Januvia as suggested at last visit ("I don't think I need it"). He was Levemir 140 units in am >> decreased to 70 units>> stopped as this was too expensive for him He stopped Welchol 3.7g at bedtime a day. Stopped Farxiga 10 mg b/c frequent urination and urgency. He was on Glipizide in the past. He was on Metformin in the past >> no N/V.   Pt checks his sugars 2x a day: - am: 157-159 >> (w/o Lantus) 130-150 >> 115-130 - 2h after b'fast: n/c >> 128, 134 >> n/c - before lunch:   206 >> n/c >> 140s >> n/c - 2h after lunch: 115 >> n/c >> 140-157 >> n/c - before dinner:  109-119 >> 120-130 >> 106-120 >> n/c  - 2h after dinner: 170-190 >> 200 >> 200-245 >> 150-180, 200 - bedtime:  151 >> 127-180, 200 >> n/c - nighttime:94, 190-305 >> 148-216 >> n/c  No lows.Lowest sugar was 100 >> 106 >> 86 >> 109; he has hypoglycemia awareness  In the 80s. Highest sugar was 200s postmeal >> 275 >> 200s >> 200.  - He has mild CKD, last BUN/creatinine:  09/07/2016: 13/0.9, glucose 133 Lab Results  Component Value  Date   BUN 14 11/26/2016   CREATININE 0.91 11/26/2016  On losartan - higher dose now. - He has hyperlipidemia- last set of lipids: 09/07/2016: 167/65/33/121 Lab Results  Component Value Date   CHOL 164 06/19/2015   HDL 38.40 (L) 06/19/2015   LDLCALC 114 (H) 06/19/2015   LDLDIRECT 168.0 12/18/2012   TRIG 61.0 06/19/2015   CHOLHDL 4 06/19/2015  On pravastatin 40 mg daily - last eye exam was in Spring 2018: No DR, reportedly - He denies numbness and tingling in his feet.  Pt was admitted for CP in 11/2014: NSTEMI >> CAD >> PTCA >> stent placed.   ROS: Constitutional: no weight gain/no weight loss, no fatigue, no subjective hyperthermia, no subjective hypothermia Eyes: no blurry vision, no xerophthalmia ENT: no sore throat, no nodules palpated in throat, no dysphagia, no odynophagia, no hoarseness Cardiovascular: no CP/no SOB/no palpitations/no leg swelling Respiratory: no cough/no SOB/no wheezing Gastrointestinal: no N/no V/no D/no C/no acid reflux Musculoskeletal: no muscle aches/no joint aches Skin: no rashes, no hair loss Neurological: no tremors/no numbness/no tingling/no dizziness  I reviewed pt's medications, allergies, PMH, social hx, family hx, and changes were documented in the history of present illness. Otherwise, unchanged from my  initial visit note.  PE: BP (!) 142/88   Pulse 67   Ht 5\' 10"  (1.778 m)   Wt 253 lb 9.6 oz (115 kg)   SpO2 94%   BMI 36.39 kg/m   Wt Readings from Last 3 Encounters:  12/10/16 253 lb 9.6 oz (115 kg)  11/26/16 255 lb (115.7 kg)  09/30/16 256 lb 6.4 oz (116.3 kg)   Constitutional: overweight, in NAD Eyes: PERRLA, EOMI, no exophthalmos ENT: moist mucous membranes, no thyromegaly, no cervical lymphadenopathy Cardiovascular: RRR, No MRG Respiratory: CTA B Gastrointestinal: abdomen soft, NT, ND, BS+ Musculoskeletal: no deformities, strength intact in all 4 Skin: moist, warm, no rashes Neurological: no tremor with outstretched hands,  DTR normal in all 4  ASSESSMENT: 1. DM2, insulin-dependent, uncontrolled, with complications - CAD, s/p NSTEMI, s/p stent 11/2014 - ED  2. Obesity class II BMI Classification:  < 18.5 underweight   18.5-24.9 normal weight   25.0-29.9 overweight   30.0-34.9 class I obesity   35.0-39.9 class II obesity   ? 40.0 class III obesity    PLAN:  1. Patient with euncontrolled diabetes, with better controlled at last visit, after he started to be more active, so he could come off Lantus. At last visit, his post-dinner sugars were still high but his a.m. sugars improved. At that time, we discussed that he may feel the prescription for Januvia if the sugars do not improve at that time. We also discussed about crushing the glipizide before dinner to avoid hypoglycemia overnight. We also discussed about improving his meals especially avoiding eating fried food, which she was doing every night.  - At this visit, he tells me that he continues to try to improve his dinner, and he did really good until 3 weeks ago, when he started to eat more candy, which his wife brings into the house. We discussed about cutting out the milky ways and also continue to work on cutting down on the [employer]. - At this visit, we also reviewed his previous treatments  at one point, he was on 140 units of Levemir daily. His diabetes is now much better controlled on no insulin! - I suggested to:  Patient Instructions  Please continue: - Metformin 1000 mg 2x a day - Glipizide XL 5 mg 2x a day - break/crush the evening dose - 15-30 min before a meal  Please come back for a follow-up appointment in 3 months.  - today, HbA1c is 7.4% (better) - continue checking sugars at different times of the day - check 1-2x a day, rotating checks - advised for yearly eye exams >> he is UTD - Return to clinic in 3 mo with sugar log    2. Obesity class 2 - Lost 3 pounds since last visit - We again discussed about improving  his diet as he needs to lose more weight  Ronald Kingdom, MD PhD [facility] Endocrinology

## 2016-12-16 ENCOUNTER — Encounter: Payer: Self-pay | Admitting: Interventional Cardiology

## 2016-12-29 ENCOUNTER — Encounter: Payer: Self-pay | Admitting: Internal Medicine

## 2017-01-01 ENCOUNTER — Other Ambulatory Visit: Payer: Self-pay | Admitting: Interventional Cardiology

## 2017-01-02 NOTE — Progress Notes (Signed)
Patient ID: [name]                 DOB: [birth year]                      MRN: [id number]     HPI: [name] is a 66 y.o. male referred by Dr. Tamala Julian to HTN clinic. PMH is significant for CAD s/p DES to LCx 11/2014, HTN, HLD, OSA, T2DM, PAF, GERD, OA. Recent BP readings in clinic have remained within the 140-150s/80s. At last clinic visit, pt had HCTZ component of losartan-HCTZ increased to 25mg . Pt recently reported cough that he thought was due to losartan, however cough did not improve upon discontinuing losartan for a week and began to improve shortly after he resumed taking it.  Pt presents to clinic in good spirits. He reports occasional fatigue from Lopressor and currently only takes it once daily. Pt also states he stopped taking the Imdur months ago as he did not feel like it was working, but did take it yesterday for the first time "in a while". Otherwise, pt has had no symptoms since increasing dose of HCTZ last month.  Current HTN meds: losartan/HCTZ 100-25mg  daily, Lopressor 25mg  daily (prescribed 25mg  Qam and 12.5mg  Qpm)  Previously tried: amlodipine (swelling), aliskiren (concommitant ARB use), olmesartan/HCTZ (unknown), valsartan/HCTZ (cost), Imdur (not working per patient)  BP goal: <130/80 mmHg  Family History: CAD, DM, stroke (mother)  Social History: 5 pack-year smoking history (quit 33 years ago), drinks alcohol, no illicit drugs  Diet: labile diet - endorses frequently eating salads, hamburgers  Exercise: variable, occasional yard work  Home BP readings: no longer checks as home monitor does not work  [employer] from Last 3 Encounters:  12/10/16 253 lb 9.6 oz (115 kg)  11/26/16 255 lb (115.7 kg)  09/30/16 256 lb 6.4 oz (116.3 kg)   BP Readings from Last 3 Encounters:  12/10/16 (!) 142/88  11/26/16 (!) 150/88  09/30/16 (!) 142/84   Pulse Readings from Last 3 Encounters:  12/10/16 67  11/26/16 61  09/30/16 65    Renal function: CrCl cannot be  calculated (Patient's most recent lab result is older than the maximum 21 days allowed.).  Past Medical History:  Diagnosis Date  . Adenomatous colon polyp 2011  . Anemia   . Angina pectoris ([place]) 05/01/2015   med rx for 95% OM (u/a to access due to tortuosity), 95% D1 and other moderate CAD at cath  . Arthritis    "back, knees" (05/01/2015)  . Chest pain    pleuritic  . Chronic lower back pain   . Coronary artery disease   . Depression   . GERD (gastroesophageal reflux disease)   . HTN (hypertension)   . Hyperlipidemia   . Obstructive sleep apnea    noncompliant with CPAP  . Osteoarthritis   . Pneumonia ~ 2014 X 1  . Refusal of blood transfusions as patient is Jehovah's Witness   . Type II or unspecified type diabetes mellitus without mention of complication, not stated as uncontrolled     Current Outpatient Prescriptions on File Prior to Visit  Medication Sig Dispense Refill  . Blood Glucose Monitoring Suppl (RELION PREMIER BLU MONITOR) DEVI 1 Device by Does not apply route daily. 1 Device 0  . esomeprazole (NEXIUM) 40 MG capsule Take 40 mg by mouth daily as needed (heartburn or acid reflux).    Marland Kitchen glipiZIDE (GLIPIZIDE XL) 5 MG 24 hr tablet Take 1  tablet twice daily 90 tablet 2  . glucose blood test strip Use as instructed to check sugar 2 times daily 200 each 5  . Insulin Pen Needle (NOVOFINE PLUS) 32G X 4 MM MISC Use 1x a day 100 each 11  . isosorbide mononitrate (IMDUR) 60 MG 24 hr tablet Take 1 tablet (60 mg total) by mouth daily. 30 tablet 11  . latanoprost (XALATAN) 0.005 % ophthalmic solution Place 1 drop into both eyes at bedtime.  5  . losartan-hydrochlorothiazide (HYZAAR) 100-25 MG tablet Take 1 tablet by mouth daily. 90 tablet 3  . metFORMIN (GLUCOPHAGE) 500 MG tablet TAKE 2 TABLETS (1,000 MG TOTAL) BY MOUTH 2 (TWO) TIMES DAILY WITH A MEAL. 360 tablet 0  . metoprolol tartrate (LOPRESSOR) 25 MG tablet TAKE  25 MG BY MOUTH IN AM AND TAKE 12.5 MG BY MOUTH IN PM 135 tablet  1  . mirabegron ER (MYRBETRIQ) 25 MG TB24 tablet Take 25 mg by mouth daily.    . nitroGLYCERIN (NITROSTAT) 0.4 MG SL tablet Place 1 tablet (0.4 mg total) under the tongue every 5 (five) minutes x 3 doses as needed. (CHEST PAIN) 25 tablet 1  . pravastatin (PRAVACHOL) 40 MG tablet Take 40 mg by mouth daily.  3  . tiZANidine (ZANAFLEX) 4 MG tablet Take 4 mg by mouth every 8 (eight) hours as needed. (MUSCLE SPASMS)  2  . traZODone (DESYREL) 50 MG tablet Take 1 tablet (50 mg total) by mouth at bedtime as needed for sleep. 30 tablet 3   No current facility-administered medications on file prior to visit.     Allergies  Allergen Reactions  . Statins Other (See Comments)    REACTION: joint pain Lipitor- headaches Has also tried Livalo, pravachol, zetia, crestor, welchol     Assessment/Plan:  1. Hypertension - BP remains uncontrolled today at 142/86 likely due to medication non-compliance. Discussed the importance of medication compliance extensively. Reviewed the options of adding a new medication vs resuming Imdur and metoprolol BID, pt stated he would be willing to resume taking Imdur 60mg  daily and metoprolol BID again (25mg  qAM, 12.5mg  qPM). Will F/U in clinic in 3 weeks to reassess BP control. If BP remains elevated or pt does not comply with regimen, could consider transitioning metoprolol to carvedilol or adding hydralazine.  Arrie Senate, PharmD PGY-2 Cardiology Pharmacy Resident Pager: [phone] 01/03/2017

## 2017-01-03 ENCOUNTER — Ambulatory Visit (INDEPENDENT_AMBULATORY_CARE_PROVIDER_SITE_OTHER): Payer: Federal, State, Local not specified - PPO | Admitting: Pharmacist Clinician (PhC)/ Clinical Pharmacy Specialist

## 2017-01-03 VITALS — BP 142/86 | HR 75

## 2017-01-03 DIAGNOSIS — I10 Essential (primary) hypertension: Secondary | ICD-10-CM | POA: Insufficient documentation

## 2017-01-03 NOTE — Patient Instructions (Addendum)
It was great to see you today.  Begin taking metoprolol 25mg  (1 tablet) in the morning and 12.5mg  (1/2 tablet) in the evening.  Begin taking isosorbide mononitrate (Imdur) 60mg  once daily again.  Continue taking losartan-hydrochlorothiazide 100mg -25mg  once daily.  Please return to clinic again in 4 weeks.

## 2017-01-05 ENCOUNTER — Encounter: Payer: Self-pay | Admitting: Cardiology

## 2017-01-07 MED ORDER — NITROGLYCERIN 0.4 MG SL SUBL: SUBLINGUAL_TABLET | SUBLINGUAL | 1 refills | Status: DC

## 2017-01-07 NOTE — Addendum Note (Signed)
Addended by: Derl Barrow on: 01/07/2017 12:07 PM   Modules accepted: Orders

## 2017-01-17 NOTE — Telephone Encounter (Signed)
Patient need a Rx for metformin, he is out he stated pharmacy called this morning. He haven't heard anything. Please advise

## 2017-01-18 ENCOUNTER — Other Ambulatory Visit: Payer: Self-pay

## 2017-01-18 MED ORDER — METFORMIN HCL 500 MG PO TABS: ORAL_TABLET | ORAL | 0 refills | Status: DC

## 2017-01-21 ENCOUNTER — Other Ambulatory Visit: Payer: Self-pay

## 2017-01-21 MED ORDER — METFORMIN HCL 500 MG PO TABS: ORAL_TABLET | ORAL | 0 refills | Status: DC

## 2017-01-24 ENCOUNTER — Ambulatory Visit: Payer: Federal, State, Local not specified - PPO

## 2017-02-14 ENCOUNTER — Other Ambulatory Visit: Payer: Self-pay | Admitting: Internal Medicine

## 2017-02-14 DIAGNOSIS — Z9861 Coronary angioplasty status: Secondary | ICD-10-CM | POA: Diagnosis not present

## 2017-02-14 DIAGNOSIS — I208 Other forms of angina pectoris: Secondary | ICD-10-CM | POA: Diagnosis not present

## 2017-02-14 DIAGNOSIS — I119 Hypertensive heart disease without heart failure: Secondary | ICD-10-CM | POA: Diagnosis not present

## 2017-02-14 DIAGNOSIS — Z6836 Body mass index (BMI) 36.0-36.9, adult: Secondary | ICD-10-CM | POA: Diagnosis not present

## 2017-02-14 DIAGNOSIS — E1151 Type 2 diabetes mellitus with diabetic peripheral angiopathy without gangrene: Secondary | ICD-10-CM | POA: Diagnosis not present

## 2017-02-14 DIAGNOSIS — Z794 Long term (current) use of insulin: Secondary | ICD-10-CM | POA: Diagnosis not present

## 2017-02-14 DIAGNOSIS — F3189 Other bipolar disorder: Secondary | ICD-10-CM | POA: Diagnosis not present

## 2017-02-14 DIAGNOSIS — D692 Other nonthrombocytopenic purpura: Secondary | ICD-10-CM | POA: Diagnosis not present

## 2017-02-14 DIAGNOSIS — G4733 Obstructive sleep apnea (adult) (pediatric): Secondary | ICD-10-CM | POA: Diagnosis not present

## 2017-02-18 ENCOUNTER — Telehealth: Payer: Self-pay | Admitting: Interventional Cardiology

## 2017-02-18 NOTE — Telephone Encounter (Signed)
Pt states he had an episode of chest discomfort this past Monday while walking to a basketball game, he stopped walking and symptoms resolved.  Pt denies any symptoms since Monday except feeling tired. Pt has an appointment already scheduled for this Monday with Dr Tamala Julian. Pt advised to take it easy over the weekend, use NTG as needed, report to ED if symptoms worsen or do not resolve with NTG.

## 2017-02-18 NOTE — Telephone Encounter (Signed)
LMTCB

## 2017-02-18 NOTE — Telephone Encounter (Signed)
New Message:     Pt made an appointment for Monday. He is not having any symptoms or problems today. Please call to evaluate and see if pt needs to go to the ER before Monday or see one of our providers today.Pt has such a strong history.

## 2017-02-21 ENCOUNTER — Ambulatory Visit: Payer: Federal, State, Local not specified - PPO | Admitting: Interventional Cardiology

## 2017-02-21 NOTE — Progress Notes (Deleted)
Cardiology Office Note    Date:  02/21/2017   ID:  Javontae, [name] 01, 1952, MRN [id number]  PCP:  [name], MD  Cardiologist: [name], MD   No chief complaint on file.   History of Present Illness:  [name] is a 66 y.o. male  hx of CAD s/p DES to LCx 11/2014, OM1 not stented due to inability of stent to track (likely due to wire tangle), HTN, HLD, OSA not on CAPA and PAT/PAF (not on anticoagulation due to low burden) who resented for evaluation of dyspnea on exertion.       Past Medical History:  Diagnosis Date  . Adenomatous colon polyp 2011  . Anemia   . Angina pectoris ([place]) 05/01/2015   med rx for 95% OM (u/a to access due to tortuosity), 95% D1 and other moderate CAD at cath  . Arthritis    "back, knees" (05/01/2015)  . Chest pain    pleuritic  . Chronic lower back pain   . Coronary artery disease   . Depression   . GERD (gastroesophageal reflux disease)   . HTN (hypertension)   . Hyperlipidemia   . Obstructive sleep apnea    noncompliant with CPAP  . Osteoarthritis   . Pneumonia ~ 2014 X 1  . Refusal of blood transfusions as patient is Jehovah's Witness   . Type II or unspecified type diabetes mellitus without mention of complication, not stated as uncontrolled     Past Surgical History:  Procedure Laterality Date  . CARDIAC CATHETERIZATION  2004   patent coronary arteries  . CARDIAC CATHETERIZATION  05/01/2015   "tried to put stent in but couldn't"  . CLOSED REDUCTION SHOULDER DISLOCATION Right ~ 1975   "& reattached muscle"  . COLONOSCOPY W/ BIOPSIES AND POLYPECTOMY  X 2  . Coronary Stent Intervention N/A 05/01/2015   Performed by Belva Crome, MD at [facility]  . ESOPHAGOGASTRODUODENOSCOPY ENDOSCOPY    . Left Heart Cath and Coronary Angiography N/A 05/01/2015   Performed by Belva Crome, MD at [facility]  . Left Heart Cath and Coronary Angiography N/A 11/06/2014   Performed by Belva Crome, MD at [facility]  . PILONIDAL CYST EXCISION      Current Medications: Outpatient Medications Prior to Visit  Medication Sig Dispense Refill  . Blood Glucose Monitoring Suppl (RELION PREMIER BLU MONITOR) DEVI 1 Device by Does not apply route daily. 1 Device 0  . esomeprazole (NEXIUM) 40 MG capsule Take 40 mg by mouth daily as needed (heartburn or acid reflux).    Marland Kitchen glipiZIDE (GLIPIZIDE XL) 5 MG 24 hr tablet Take 1 tablet twice daily 90 tablet 2  . glucose blood test strip Use as instructed to check sugar 2 times daily 200 each 5  . Insulin Pen Needle (NOVOFINE PLUS) 32G X 4 MM MISC Use 1x a day 100 each 11  . isosorbide mononitrate (IMDUR) 60 MG 24 hr tablet Take 1 tablet (60 mg total) by mouth daily. 30 tablet 11  . latanoprost (XALATAN) 0.005 % ophthalmic solution Place 1 drop into both eyes at bedtime.  5  . losartan-hydrochlorothiazide (HYZAAR) 100-25 MG tablet Take 1 tablet by mouth daily. 90 tablet 3  . metFORMIN (GLUCOPHAGE) 500 MG tablet TAKE 2 TABLETS (1,000 MG TOTAL) BY MOUTH 2 (TWO) TIMES DAILY WITH A MEAL. 360 tablet 0  . metoprolol tartrate (LOPRESSOR) 25 MG tablet TAKE  25  MG BY MOUTH IN AM AND TAKE 12.5 MG BY MOUTH IN PM 135 tablet 1  . mirabegron ER (MYRBETRIQ) 25 MG TB24 tablet Take 25 mg by mouth daily.    . nitroGLYCERIN (NITROSTAT) 0.4 MG SL tablet PLACE ONE TABLET UNDER THE TONGUE EVERY 5 MINUTES X3 DOSES AS NEEDED FOR CHEST PAIN 75 tablet 1  . pravastatin (PRAVACHOL) 40 MG tablet Take 40 mg by mouth daily.  3  . tiZANidine (ZANAFLEX) 4 MG tablet Take 4 mg by mouth every 8 (eight) hours as needed. (MUSCLE SPASMS)  2  . traZODone (DESYREL) 50 MG tablet Take 1 tablet (50 mg total) by mouth at bedtime as needed for sleep. 30 tablet 3   No facility-administered medications prior to visit.      Allergies:   Statins   Social History   Socioeconomic History  . Marital status: Married    Spouse name: Not on file  . Number of children: 4  . Years of education: Not on file    . Highest education level: Not on file  Social Needs  . Financial resource strain: Not on file  . Food insecurity - worry: Not on file  . Food insecurity - inability: Not on file  . Transportation needs - medical: Not on file  . Transportation needs - non-medical: Not on file  Occupational History  . Occupation: Retired    Fish farm manager: UNEMPLOYED  Tobacco Use  . Smoking status: Former Smoker    Packs/day: 0.50    Years: 10.00    Pack years: 5.00    Types: Cigarettes    Last attempt to quit: 04/06/1983    Years since quitting: 33.9  . Smokeless tobacco: Never Used  Substance and Sexual Activity  . Alcohol use: Yes    Alcohol/week: 0.0 oz    Comment: `/26/2017 "might drink a beer q couple months mostly; summertime I might drink 2-3 beers/week"  . Drug use: No  . Sexual activity: Not Currently  Other Topics Concern  . Not on file  Social History Narrative   Lives in [place] with spouse Jarryd Gratz).  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     Family History:  The patient's ***family history includes Alcohol abuse in his other; Colon cancer in his paternal uncle; Coronary artery disease in his mother and other; Diabetes in his maternal uncle, mother, paternal uncle, and sister; Heart disease in his mother; Stroke in his mother.   ROS:   Please see the history of present illness.    ***  All other systems reviewed and are negative.   PHYSICAL EXAM:   VS:  There were no vitals taken for this visit.   GEN: Well nourished, well developed, in no acute distress  HEENT: normal  Neck: no JVD, carotid bruits, or masses Cardiac: ***RRR; no murmurs, rubs, or gallops,no edema  Respiratory:  clear to auscultation bilaterally, normal work of breathing GI: soft, nontender, nondistended, + BS MS: no deformity or atrophy  Skin: warm and dry, no rash Neuro:  Alert and Oriented x 3, Strength and sensation are intact Psych: euthymic  mood, full affect  Wt Readings from Last 3 Encounters:  12/10/16 253 lb 9.6 oz (115 kg)  11/26/16 255 lb (115.7 kg)  09/30/16 256 lb 6.4 oz (116.3 kg)      Studies/Labs Reviewed:   EKG:  EKG  ***  Recent Labs: 11/26/2016: BUN 14; Creatinine, Ser 0.91; Potassium 4.3;  Sodium 139   Lipid Panel    Component Value Date/Time   CHOL 164 06/19/2015 1624   TRIG 61.0 06/19/2015 1624   HDL 38.40 (L) 06/19/2015 1624   CHOLHDL 4 06/19/2015 1624   VLDL 12.2 06/19/2015 1624   LDLCALC 114 (H) 06/19/2015 1624   LDLDIRECT 168.0 12/18/2012 1340    Additional studies/ records that were reviewed today include:  ***    ASSESSMENT:    1. Angina pectoris ([place])   2. Benign essential HTN   3. Coronary artery disease involving coronary bypass graft of native heart with angina pectoris ([place])   4. PAT (paroxysmal atrial tachycardia) ([place])   5. Obstructive sleep apnea      PLAN:  In order of problems listed above:  1. ***    Medication Adjustments/Labs and Tests Ordered: Current medicines are reviewed at length with the patient today.  Concerns regarding medicines are outlined above.  Medication changes, Labs and Tests ordered today are listed in the Patient Instructions below. There are no Patient Instructions on file for this visit.   Signed, [name], MD  02/21/2017 8:35 AM    [facility] [place], [place], [place]  [address] Phone: [phone]; Fax: [phone]

## 2017-02-22 ENCOUNTER — Encounter: Payer: Self-pay | Admitting: Nurse Practitioner

## 2017-02-22 ENCOUNTER — Encounter (INDEPENDENT_AMBULATORY_CARE_PROVIDER_SITE_OTHER): Payer: Self-pay

## 2017-02-22 ENCOUNTER — Ambulatory Visit (INDEPENDENT_AMBULATORY_CARE_PROVIDER_SITE_OTHER): Payer: Federal, State, Local not specified - PPO | Admitting: Nurse Practitioner

## 2017-02-22 VITALS — BP 146/80 | HR 67 | Ht 70.0 in | Wt 251.8 lb

## 2017-02-22 DIAGNOSIS — I10 Essential (primary) hypertension: Secondary | ICD-10-CM | POA: Diagnosis not present

## 2017-02-22 MED ORDER — HYDRALAZINE HCL 25 MG PO TABS: 25.0000 mg | ORAL_TABLET | Freq: Two times a day (BID) | ORAL | 3 refills | Status: DC

## 2017-02-22 NOTE — Progress Notes (Signed)
CARDIOLOGY OFFICE NOTE  Date:  02/22/2017    [name] Date of Birth: January 09, 1951 Medical Record #[id number]  PCP:  [name], MD  Cardiologist:  Tamala Julian  Chief Complaint  Patient presents with  . Coronary Artery Disease    Follow up visit - seen for Dr. Tamala Julian    History of Present Illness: [name] is a 66 y.o. male who presents today for a follow up visit. Seen for Dr. Tamala Julian.   He has a hx of CAD s/p DES to LCx 11/2014, OM1 not stented due to inability of stent to track (likely due to wire tangle), HTN, HLD, DM, OSA not on CAPA and PAT/PAF (not on anticoagulation due to low burden).    Last seen by [name], NP back in August - complained of cough and swelling - he thought is from his medicines. He was not short of breath. Had a trial off ARB with no results. BP was elevated. Seen by pharmacy earlier last month for a check - BP was up but he was not taking medicines as prescribed.   Was to have been seen by Dr. Tamala Julian yesterday but missed the appointment.   Comes in today. Here alone. Quite talkative. Rambles.  He is here because he "has some concerns". He has stopped his Imdur due to headache. Says this was "violent". Concerned about swelling in his legs - talking about Norvasc - but he is not to be on this because of swelling. But he tells me he has been taking it "every now and then" and that when he takes it, his legs swell. He is not coughing anymore. Did have some chest pain during the cold rain we had last week - he was out at some ballgame - did use a sl NTG but thought this was more indigestion - this is now resolved. He has been started on Lexapro - by his PCP - but asking for Trazodone by Dr. Tamala Julian. Sleeps all during the day and is up "eating" all night. Asking about trying to get back on his CPAP. Sporadic exercise. Admits his diet is bad - wife brings lots of fast food home. Getting way too much salt. Way too much carbohydrates.  Past Medical  History:  Diagnosis Date  . Adenomatous colon polyp 2011  . Anemia   . Angina pectoris ([place]) 05/01/2015   med rx for 95% OM (u/a to access due to tortuosity), 95% D1 and other moderate CAD at cath  . Arthritis    "back, knees" (05/01/2015)  . Chest pain    pleuritic  . Chronic lower back pain   . Coronary artery disease   . Depression   . GERD (gastroesophageal reflux disease)   . HTN (hypertension)   . Hyperlipidemia   . Obstructive sleep apnea    noncompliant with CPAP  . Osteoarthritis   . Pneumonia ~ 2014 X 1  . Refusal of blood transfusions as patient is Jehovah's Witness   . Type II or unspecified type diabetes mellitus without mention of complication, not stated as uncontrolled     Past Surgical History:  Procedure Laterality Date  . CARDIAC CATHETERIZATION  2004   patent coronary arteries  . CARDIAC CATHETERIZATION  05/01/2015   "tried to put stent in but couldn't"  . CARDIAC CATHETERIZATION N/A 05/01/2015   Procedure: Left Heart Cath and Coronary Angiography;  Surgeon: Belva Crome, MD; LAD 60%, oD1 90%, pD1 70%, D2 70%, CFX patent stent,  30% distal to prev stent, pRCA 20%, OM1 90/95%, NL LV  . CARDIAC CATHETERIZATION N/A 05/01/2015   Procedure: Coronary Stent Intervention;  Surgeon: Belva Crome, MD;  Unsuccessful PCI OM due to tortuosity  . CARDIAC CATHETERIZATION N/A 11/06/2014   Procedure: Left Heart Cath and Coronary Angiography;  Surgeon: Belva Crome, MD;  Location: [facility];  Service: Cardiovascular;  Laterality: N/A;  . CLOSED REDUCTION SHOULDER DISLOCATION Right ~ 1975   "& reattached muscle"  . COLONOSCOPY W/ BIOPSIES AND POLYPECTOMY  X 2  . ESOPHAGOGASTRODUODENOSCOPY ENDOSCOPY    . PILONIDAL CYST EXCISION       Medications: Current Meds  Medication Sig  . Blood Glucose Monitoring Suppl (RELION PREMIER BLU MONITOR) DEVI 1 Device by Does not apply route daily.  Marland Kitchen escitalopram (LEXAPRO) 10 MG tablet Take 10 mg by mouth daily.  Marland Kitchen esomeprazole  (NEXIUM) 40 MG capsule Take 40 mg by mouth daily as needed (heartburn or acid reflux).  Marland Kitchen glipiZIDE (GLIPIZIDE XL) 5 MG 24 hr tablet Take 1 tablet twice daily  . glucose blood test strip Use as instructed to check sugar 2 times daily  . Insulin Pen Needle (NOVOFINE PLUS) 32G X 4 MM MISC Use 1x a day  . latanoprost (XALATAN) 0.005 % ophthalmic solution Place 1 drop into both eyes at bedtime.  Marland Kitchen losartan-hydrochlorothiazide (HYZAAR) 100-25 MG tablet Take 1 tablet by mouth daily.  . metFORMIN (GLUCOPHAGE) 500 MG tablet TAKE 2 TABLETS (1,000 MG TOTAL) BY MOUTH 2 (TWO) TIMES DAILY WITH A MEAL.  . metoprolol tartrate (LOPRESSOR) 25 MG tablet TAKE  25 MG BY MOUTH IN AM AND TAKE 12.5 MG BY MOUTH IN PM  . mirabegron ER (MYRBETRIQ) 25 MG TB24 tablet Take 25 mg by mouth daily.  . nitroGLYCERIN (NITROSTAT) 0.4 MG SL tablet PLACE ONE TABLET UNDER THE TONGUE EVERY 5 MINUTES X3 DOSES AS NEEDED FOR CHEST PAIN  . pravastatin (PRAVACHOL) 40 MG tablet Take 40 mg by mouth daily.  . traZODone (DESYREL) 50 MG tablet Take 1 tablet (50 mg total) by mouth at bedtime as needed for sleep.     Allergies: Allergies  Allergen Reactions  . Statins Other (See Comments)    REACTION: joint pain Lipitor- headaches Has also tried Livalo, pravachol, zetia, crestor, welchol  . Imdur [Isosorbide Dinitrate]     Headache - "violent"    Social History: The patient  reports that he quit smoking about 33 years ago. His smoking use included cigarettes. He has a 5.00 pack-year smoking history. he has never used smokeless tobacco. He reports that he drinks alcohol. He reports that he does not use drugs.   Family History: The patient's family history includes Alcohol abuse in his other; Colon cancer in his paternal uncle; Coronary artery disease in his mother and other; Diabetes in his maternal uncle, mother, paternal uncle, and sister; Heart disease in his mother; Stroke in his mother.   Review of Systems: Please see the history  of present illness.   Otherwise, the review of systems is positive for none.   All other systems are reviewed and negative.   Physical Exam: VS:  BP (!) 146/80   Pulse 67   Ht 5\' 10"  (1.778 m)   Wt 251 lb 12.8 oz (114.2 kg)   SpO2 97%   BMI 36.13 kg/m  .  BMI Body mass index is 36.13 kg/m.  Wt Readings from Last 3 Encounters:  02/22/17 251 lb 12.8 oz (114.2 kg)  12/10/16 253  lb 9.6 oz (115 kg)  11/26/16 255 lb (115.7 kg)   BP by me is 150/80  General: Pleasant. Pretty talkative. Obese. Alert and in no acute distress.   HEENT: Normal.  Neck: Supple, no JVD, carotid bruits, or masses noted.  Cardiac: Regular rate and rhythm. No murmurs, rubs, or gallops. No edema.  Respiratory:  Lungs are clear to auscultation bilaterally with normal work of breathing.  GI: Soft and nontender.  MS: No deformity or atrophy. Gait and ROM intact.  Skin: Warm and dry. Color is normal.  Neuro:  Strength and sensation are intact and no gross focal deficits noted.  Psych: Alert, appropriate and with normal affect.   LABORATORY DATA:  EKG:  EKG is ordered today. This shows NSR with 1st degree AV block - PVC noted.   Lab Results  Component Value Date   WBC 8.0 11/11/2015   HGB 13.6 11/11/2015   HCT 42.2 11/11/2015   PLT 240 11/11/2015   GLUCOSE 155 (H) 11/26/2016   CHOL 164 06/19/2015   TRIG 61.0 06/19/2015   HDL 38.40 (L) 06/19/2015   LDLDIRECT 168.0 12/18/2012   LDLCALC 114 (H) 06/19/2015   ALT 17 06/19/2015   AST 16 06/19/2015   NA 139 11/26/2016   K 4.3 11/26/2016   CL 102 11/26/2016   CREATININE 0.91 11/26/2016   BUN 14 11/26/2016   CO2 22 11/26/2016   TSH 1.52 11/11/2015   PSA 3.53 04/19/2013   INR 0.98 04/30/2015   HGBA1C 7.4 12/10/2016   MICROALBUR 0.7 09/29/2011     BNP (last 3 results) No results for input(s): BNP in the last 8760 hours.  ProBNP (last 3 results) No results for input(s): PROBNP in the last 8760 hours.   Other Studies Reviewed Today:  Echo  03/2016 Study Conclusions  - Left ventricle: The cavity size was normal. Wall thickness was increased in a pattern of mild LVH. Systolic function was normal. The estimated ejection fraction was in the range of 55% to 60%. Wall motion was normal; there were no regional wall motion abnormalities. Doppler parameters are consistent with abnormal left ventricular relaxation (grade 1 diastolic dysfunction). - Aorta: Aortic root dimension: 41 mm (ED). - Ascending aorta: The ascending aorta was mildly dilated. - Mitral valve: Moderately calcified annulus. There was systolic anterior motion of the chordal structures. - Left atrium: The atrium was moderately dilated.  Impressions:  - Compared to the prior study, there has been no significant interval change.   ASSESSMENT AND PLAN:  1.  Cough - this seems to have resolved.   2.  HTN poorly controlled - has not tolerated Norvasc due to swelling but he has been taking sporadically and having swelling - I have advised him to discard this medicine altogether. Has not tolerated higher doses of beta blocker due to bradycardia. Adding Hydralazine 25 mg to take BID - compliance encouraged - along with diet/exercise, etc. Unfortunately, may not have the motivation to follow thru.   3.  CAD -  LCX DES stent now on ASA alone.  OM disease unable to stent. Managed medically. Has not tolerated Imdur due to headache and not wishing to restart. Uses sl NTG prn. Has had recent incident of chest pain in the setting of cold weather - he felt like it was more indigestion - he knows to let us know if this recurs/worsen.   4.  HLD - on Pravachol  5.  Had PAF but no anticoagulation due to brief episode. Pt saw Dr.  Allred.   6.  OSA does not use CPAP but asking about how to get back on - will arrange split night sleep study.   7. Obesity - encouraged him to make his health a priority.   8. Sleep disorder - I have asked him to speak with  his PCP Dr. Osborne Casco about Trazodone - he has just been placed on lexapro. This was not filled today.   Current medicines are reviewed with the patient today.  The patient does not have concerns regarding medicines other than what has been noted above.  The following changes have been made:  See above.  Labs/ tests ordered today include:    Orders Placed This Encounter  Procedures  . EKG 12-Lead     Disposition:   FU with Dr. Tamala Julian in 4 to 6 weeks - has recall for January.   Patient is agreeable to this plan and will call if any problems develop in the interim.   SignedTruitt Merle, NP  02/22/2017 2:43 PM  [facility] [address] [place], [place]  [address] Phone: [phone] Fax: [phone]

## 2017-02-22 NOTE — Patient Instructions (Addendum)
We will be checking the following labs today - NONE   Medication Instructions:    Continue with your current medicines. BUT  I am adding Hydralazine 25 mg twice a day - this is at [facility]  I have taken the Isosorbide off your list  Do not take anymore Amlodipine    Testing/Procedures To Be Arranged:  N/A  Follow-Up:   See Dr. Tamala Julian in 4 to 6 weeks    Other Special Instructions:   We will get you back for evaluation of sleep apnea (message sent)    If you need a refill on your cardiac medications before your next appointment, please call your pharmacy.   Call the [place] office at [phone] if you have any questions, problems or concerns.

## 2017-02-23 ENCOUNTER — Telehealth: Payer: Self-pay | Admitting: *Deleted

## 2017-02-23 DIAGNOSIS — G4733 Obstructive sleep apnea (adult) (pediatric): Secondary | ICD-10-CM

## 2017-02-23 NOTE — Telephone Encounter (Signed)
-----   Message from Burtis Junes, NP sent at 02/22/2017  2:34 PM EST ----- Needs split night sleep study  lori

## 2017-03-01 DIAGNOSIS — H53451 Other localized visual field defect, right eye: Secondary | ICD-10-CM | POA: Diagnosis not present

## 2017-03-03 DIAGNOSIS — H52223 Regular astigmatism, bilateral: Secondary | ICD-10-CM | POA: Diagnosis not present

## 2017-03-11 ENCOUNTER — Other Ambulatory Visit: Payer: Self-pay | Admitting: Internal Medicine

## 2017-03-14 ENCOUNTER — Ambulatory Visit: Payer: Federal, State, Local not specified - PPO | Admitting: Internal Medicine

## 2017-03-17 DIAGNOSIS — R0982 Postnasal drip: Secondary | ICD-10-CM | POA: Diagnosis not present

## 2017-03-24 ENCOUNTER — Other Ambulatory Visit: Payer: Self-pay | Admitting: Internal Medicine

## 2017-03-24 DIAGNOSIS — Z794 Long term (current) use of insulin: Secondary | ICD-10-CM

## 2017-03-24 DIAGNOSIS — E1159 Type 2 diabetes mellitus with other circulatory complications: Secondary | ICD-10-CM

## 2017-03-28 DIAGNOSIS — H2513 Age-related nuclear cataract, bilateral: Secondary | ICD-10-CM | POA: Diagnosis not present

## 2017-03-28 DIAGNOSIS — H01022 Squamous blepharitis right lower eyelid: Secondary | ICD-10-CM | POA: Diagnosis not present

## 2017-03-28 DIAGNOSIS — H01021 Squamous blepharitis right upper eyelid: Secondary | ICD-10-CM | POA: Diagnosis not present

## 2017-03-28 DIAGNOSIS — H01024 Squamous blepharitis left upper eyelid: Secondary | ICD-10-CM | POA: Diagnosis not present

## 2017-03-28 DIAGNOSIS — H10413 Chronic giant papillary conjunctivitis, bilateral: Secondary | ICD-10-CM | POA: Diagnosis not present

## 2017-03-28 DIAGNOSIS — H01025 Squamous blepharitis left lower eyelid: Secondary | ICD-10-CM | POA: Diagnosis not present

## 2017-03-30 DIAGNOSIS — J189 Pneumonia, unspecified organism: Secondary | ICD-10-CM | POA: Insufficient documentation

## 2017-03-30 DIAGNOSIS — E785 Hyperlipidemia, unspecified: Secondary | ICD-10-CM | POA: Insufficient documentation

## 2017-03-30 DIAGNOSIS — K219 Gastro-esophageal reflux disease without esophagitis: Secondary | ICD-10-CM | POA: Insufficient documentation

## 2017-03-30 DIAGNOSIS — G4733 Obstructive sleep apnea (adult) (pediatric): Secondary | ICD-10-CM | POA: Insufficient documentation

## 2017-03-30 DIAGNOSIS — F329 Major depressive disorder, single episode, unspecified: Secondary | ICD-10-CM | POA: Insufficient documentation

## 2017-03-30 DIAGNOSIS — M545 Low back pain, unspecified: Secondary | ICD-10-CM | POA: Insufficient documentation

## 2017-03-30 DIAGNOSIS — Z9989 Dependence on other enabling machines and devices: Secondary | ICD-10-CM | POA: Insufficient documentation

## 2017-03-30 DIAGNOSIS — I10 Essential (primary) hypertension: Secondary | ICD-10-CM | POA: Insufficient documentation

## 2017-03-30 DIAGNOSIS — D649 Anemia, unspecified: Secondary | ICD-10-CM | POA: Insufficient documentation

## 2017-03-30 DIAGNOSIS — Z531 Procedure and treatment not carried out because of patient's decision for reasons of belief and group pressure: Secondary | ICD-10-CM | POA: Insufficient documentation

## 2017-03-30 DIAGNOSIS — M1990 Unspecified osteoarthritis, unspecified site: Secondary | ICD-10-CM | POA: Insufficient documentation

## 2017-03-30 DIAGNOSIS — F32A Depression, unspecified: Secondary | ICD-10-CM | POA: Insufficient documentation

## 2017-03-30 DIAGNOSIS — I2510 Atherosclerotic heart disease of native coronary artery without angina pectoris: Secondary | ICD-10-CM | POA: Insufficient documentation

## 2017-03-30 DIAGNOSIS — R079 Chest pain, unspecified: Secondary | ICD-10-CM | POA: Insufficient documentation

## 2017-03-30 DIAGNOSIS — G8929 Other chronic pain: Secondary | ICD-10-CM | POA: Insufficient documentation

## 2017-04-19 ENCOUNTER — Ambulatory Visit: Payer: Federal, State, Local not specified - PPO | Admitting: Interventional Cardiology

## 2017-04-20 ENCOUNTER — Ambulatory Visit: Payer: Federal, State, Local not specified - PPO | Admitting: Interventional Cardiology

## 2017-04-22 NOTE — Telephone Encounter (Signed)
Pt canceled sleep study  04/18/17 @9 :45 am Lula Olszewski, CMA        FYI- I went to enter the precert info into the appt notes and the patient had canceled.  Amy     ----- Message -----  From: Freada Bergeron, CMA  Sent: 04/15/2017  4:45 PM  To: Amy Markus Jarvis  Subject: RE: Approved - okay to schedule          sched 2/1  ----- Message -----  From: Almyra Free  Sent: 04/15/2017  3:12 PM  To: Freada Bergeron, CMA  Subject: Approved - okay to schedule            Pt can be scheduled between 1/18 and 4/18. Let me know when scheduled and I will enter precert info.  Thanks,  Amy

## 2017-04-27 DIAGNOSIS — H10503 Unspecified blepharoconjunctivitis, bilateral: Secondary | ICD-10-CM | POA: Diagnosis not present

## 2017-05-06 ENCOUNTER — Encounter (HOSPITAL_BASED_OUTPATIENT_CLINIC_OR_DEPARTMENT_OTHER): Payer: Federal, State, Local not specified - PPO

## 2017-05-10 ENCOUNTER — Encounter: Payer: Self-pay | Admitting: Internal Medicine

## 2017-05-10 ENCOUNTER — Encounter (INDEPENDENT_AMBULATORY_CARE_PROVIDER_SITE_OTHER): Payer: Self-pay

## 2017-05-11 ENCOUNTER — Encounter (INDEPENDENT_AMBULATORY_CARE_PROVIDER_SITE_OTHER): Payer: Self-pay

## 2017-05-16 ENCOUNTER — Encounter (INDEPENDENT_AMBULATORY_CARE_PROVIDER_SITE_OTHER): Payer: Self-pay

## 2017-05-18 ENCOUNTER — Other Ambulatory Visit: Payer: Self-pay

## 2017-05-18 ENCOUNTER — Emergency Department (HOSPITAL_COMMUNITY)
Admission: EM | Admit: 2017-05-18 | Discharge: 2017-05-18 | Disposition: A | Discharge status: Home / Self Care | Payer: Federal, State, Local not specified - PPO | Attending: Emergency Medicine | Admitting: Emergency Medicine

## 2017-05-18 ENCOUNTER — Encounter (HOSPITAL_COMMUNITY): Payer: Self-pay

## 2017-05-18 ENCOUNTER — Emergency Department (HOSPITAL_COMMUNITY): Payer: Federal, State, Local not specified - PPO

## 2017-05-18 DIAGNOSIS — I2510 Atherosclerotic heart disease of native coronary artery without angina pectoris: Secondary | ICD-10-CM | POA: Diagnosis not present

## 2017-05-18 DIAGNOSIS — E785 Hyperlipidemia, unspecified: Secondary | ICD-10-CM | POA: Diagnosis not present

## 2017-05-18 DIAGNOSIS — R079 Chest pain, unspecified: Secondary | ICD-10-CM | POA: Insufficient documentation

## 2017-05-18 DIAGNOSIS — Z87891 Personal history of nicotine dependence: Secondary | ICD-10-CM | POA: Insufficient documentation

## 2017-05-18 DIAGNOSIS — Z79899 Other long term (current) drug therapy: Secondary | ICD-10-CM | POA: Insufficient documentation

## 2017-05-18 DIAGNOSIS — E119 Type 2 diabetes mellitus without complications: Secondary | ICD-10-CM | POA: Diagnosis not present

## 2017-05-18 DIAGNOSIS — I10 Essential (primary) hypertension: Secondary | ICD-10-CM | POA: Insufficient documentation

## 2017-05-18 DIAGNOSIS — R0602 Shortness of breath: Secondary | ICD-10-CM | POA: Diagnosis not present

## 2017-05-18 DIAGNOSIS — Z794 Long term (current) use of insulin: Secondary | ICD-10-CM | POA: Insufficient documentation

## 2017-05-18 DIAGNOSIS — R05 Cough: Secondary | ICD-10-CM | POA: Diagnosis not present

## 2017-05-18 LAB — CBC
HEMATOCRIT: 42.4 % (ref 39.0–52.0)
Hemoglobin: 13.8 g/dL (ref 13.0–17.0)
MCH: 27.7 pg (ref 26.0–34.0)
MCHC: 32.5 g/dL (ref 30.0–36.0)
MCV: 85.1 fL (ref 78.0–100.0)
Platelets: 216 10*3/uL (ref 150–400)
RBC: 4.98 MIL/uL (ref 4.22–5.81)
RDW: 14.5 % (ref 11.5–15.5)
WBC: 6.5 10*3/uL (ref 4.0–10.5)

## 2017-05-18 LAB — BASIC METABOLIC PANEL
Anion gap: 11 (ref 5–15)
BUN: 8 mg/dL (ref 6–20)
CHLORIDE: 101 mmol/L (ref 101–111)
CO2: 26 mmol/L (ref 22–32)
Calcium: 8.8 mg/dL — ABNORMAL LOW (ref 8.9–10.3)
Creatinine, Ser: 0.88 mg/dL (ref 0.61–1.24)
GFR calc Af Amer: >60 mL/min (ref >60)
GFR calc non Af Amer: >60 mL/min (ref >60)
Glucose, Bld: 213 mg/dL — ABNORMAL HIGH (ref 65–99)
POTASSIUM: 3.9 mmol/L (ref 3.5–5.1)
Sodium: 138 mmol/L (ref 135–145)

## 2017-05-18 LAB — I-STAT TROPONIN, ED
Troponin i, poc: 0 ng/mL (ref 0.00–0.08)
Troponin i, poc: 0.01 ng/mL (ref 0.00–0.08)

## 2017-05-18 NOTE — Discharge Instructions (Signed)
You were evaluated in the emergency department for chest pain and shortness of breath.  Your chest x-ray EKG and lab work did not show any acute answer for your symptoms.  You should continue your regular medicines and follow-up with your primary care doctor.  If you have any worsening symptoms she should return to the emergency department.

## 2017-05-18 NOTE — ED Provider Notes (Signed)
[facility] EMERGENCY DEPARTMENT Provider Note   CSN: [id number] Arrival date & time: 05/18/17  1145     History   Chief Complaint Chief Complaint  Patient presents with  . Chest Pain  . Cough    HPI [name] is a 67 y.o. male.  The history is provided by the patient.  Chest Pain   This is a new problem. The current episode started 6 to 12 hours ago. The problem has been resolved. The pain is associated with coughing. The pain is severe. The quality of the pain is described as brief, sharp and stabbing. The pain radiates to the left neck and right neck. Associated symptoms include cough, shortness of breath and sputum production. Pertinent negatives include no abdominal pain, no back pain, no fever, no headaches, no hemoptysis, no irregular heartbeat, no malaise/fatigue, no nausea, no near-syncope, no numbness, no orthopnea, no palpitations, no syncope, no vomiting and no weakness. He has tried nitroglycerin and antacids for the symptoms. The treatment provided significant relief.  His past medical history is significant for CAD and MI.  Pertinent negatives for past medical history include no seizures.  Cough  Associated symptoms include chest pain and shortness of breath. Pertinent negatives include no chills, no ear pain, no headaches and no sore throat.    67 year old male with history of coronary disease and MI has complained of 1 month of cough.  It is gotten worse over the past week or so productive of some phlegm.  There is been no fever.  He states he was acutely woke up this morning with tightness in his throat and felt like he was not able to breathe.  Lasted about 10 minutes.  Said he tried nitro and Prilosec but is not sure what relieve the symptoms.  He has been symptom-free since then.  He is here because he wanted to make sure it was not his heart attack.  His prior MI was associated with a vice-like pain across the shoulders.  He has been taking his  medications regularly.  His wife is also been troubled by a sinus illness and there is been other family members who have been sick.  Past Medical History:  Diagnosis Date  . Adenomatous colon polyp 2011  . Anemia   . Angina pectoris ([facility]) 05/01/2015   med rx for 95% OM (u/a to access due to tortuosity), 95% D1 and other moderate CAD at cath  . Arthritis    "back, knees" (05/01/2015)  . Chest pain    pleuritic  . Chronic lower back pain   . Coronary artery disease   . Depression   . GERD (gastroesophageal reflux disease)   . HTN (hypertension)   . Hyperlipidemia   . Obstructive sleep apnea    noncompliant with CPAP  . Osteoarthritis   . Pneumonia ~ 2014 X 1  . Refusal of blood transfusions as patient is Jehovah's Witness   . Type II or unspecified type diabetes mellitus without mention of complication, not stated as uncontrolled     Patient Active Problem List   Diagnosis Date Noted  . Refusal of blood transfusions as patient is Jehovah's Witness   . Pneumonia   . Osteoarthritis   . Obstructive sleep apnea   . Hyperlipidemia   . HTN (hypertension)   . GERD (gastroesophageal reflux disease)   . Depression   . Coronary artery disease   . Chronic lower back pain   . Chest pain   .  Arthritis   . Anemia   . Benign essential HTN 01/03/2017  . PAT (paroxysmal atrial tachycardia) ([place]) 10/27/2015  . Type 2 diabetes mellitus with circulatory disorder, without long-term current use of insulin ([place]) 06/19/2015  . Angina pectoris ([place])   . Elevated PSA 12/20/2014  . Coronary artery disease involving coronary bypass graft of native heart with angina pectoris ([place])   . Obstructive sleep apnea 09/06/2014  . Personal history of colonic polyps 12/13/2013  . Blood in stool 12/13/2013  . Prostatitis, acute 12/13/2013  . Insomnia 04/19/2013  . Obesity, Class II, BMI 35.0-39.9, with comorbidity (see actual BMI) 10/18/2012  . Right lumbar radiculitis 09/29/2011  . Adenomatous colon  polyp 04/05/2009  . ORGANIC IMPOTENCE 11/11/2008  . Hyperlipidemia with target LDL less than 100 08/23/2008  . GERD 08/23/2008  . BPH (benign prostatic hypertrophy) with urinary obstruction 08/23/2008  . OSTEOARTHRITIS 08/23/2008    Past Surgical History:  Procedure Laterality Date  . CARDIAC CATHETERIZATION  2004   patent coronary arteries  . CARDIAC CATHETERIZATION  05/01/2015   "tried to put stent in but couldn't"  . CARDIAC CATHETERIZATION N/A 05/01/2015   Procedure: Left Heart Cath and Coronary Angiography;  Surgeon: Belva Crome, MD; LAD 60%, oD1 90%, pD1 70%, D2 70%, CFX patent stent, 30% distal to prev stent, pRCA 20%, OM1 90/95%, NL LV  . CARDIAC CATHETERIZATION N/A 05/01/2015   Procedure: Coronary Stent Intervention;  Surgeon: Belva Crome, MD;  Unsuccessful PCI OM due to tortuosity  . CARDIAC CATHETERIZATION N/A 11/06/2014   Procedure: Left Heart Cath and Coronary Angiography;  Surgeon: Belva Crome, MD;  Location: [facility];  Service: Cardiovascular;  Laterality: N/A;  . CLOSED REDUCTION SHOULDER DISLOCATION Right ~ 1975   "& reattached muscle"  . COLONOSCOPY W/ BIOPSIES AND POLYPECTOMY  X 2  . ESOPHAGOGASTRODUODENOSCOPY ENDOSCOPY    . PILONIDAL CYST EXCISION         Home Medications    Prior to Admission medications   Medication Sig Start Date End Date Taking? Authorizing Provider  Blood Glucose Monitoring Suppl (RELION PREMIER BLU MONITOR) DEVI 1 Device by Does not apply route daily. 07/12/16   [name], MD  escitalopram (LEXAPRO) 10 MG tablet Take 10 mg by mouth daily. 02/14/17   [provider]  esomeprazole (NEXIUM) 40 MG capsule Take 40 mg by mouth daily as needed (heartburn or acid reflux).    [provider]  glipiZIDE (GLIPIZIDE XL) 5 MG 24 hr tablet Take 1 tablet twice daily 11/17/16   [name], MD  glipiZIDE (GLUCOTROL XL) 5 MG 24 hr tablet TAKE 1 TABLET BY MOUTH TWICE A DAY 03/16/17   [name], MD  glucose  blood test strip Use as instructed to check sugar 2 times daily 07/12/16   [name], MD  hydrALAZINE (APRESOLINE) 25 MG tablet Take 1 tablet (25 mg total) by mouth 2 (two) times daily. 02/22/17 05/23/17  Burtis Junes, NP  Insulin Pen Needle (NOVOFINE PLUS) 32G X 4 MM MISC Use 1x a day 05/09/14   [name], MD  latanoprost (XALATAN) 0.005 % ophthalmic solution Place 1 drop into both eyes at bedtime. 03/21/15   [provider]  losartan-hydrochlorothiazide (HYZAAR) 100-25 MG tablet Take 1 tablet by mouth daily. 11/26/16   Isaiah Serge, NP  metFORMIN (GLUCOPHAGE) 500 MG tablet TAKE 2 TABLETS (1,000 MG TOTAL) BY MOUTH 2 (TWO) TIMES DAILY WITH A MEAL. 01/21/17   [name], MD  metoprolol tartrate (LOPRESSOR)  25 MG tablet TAKE  25 MG BY MOUTH IN AM AND TAKE 12.5 MG BY MOUTH IN PM 09/21/16   Belva Crome, MD  mirabegron ER (MYRBETRIQ) 25 MG TB24 tablet Take 25 mg by mouth daily.    [provider]  nitroGLYCERIN (NITROSTAT) 0.4 MG SL tablet PLACE ONE TABLET UNDER THE TONGUE EVERY 5 MINUTES X3 DOSES AS NEEDED FOR CHEST PAIN 01/07/17   Belva Crome, MD  pravastatin (PRAVACHOL) 40 MG tablet Take 40 mg by mouth daily. 12/13/15   [provider]  traZODone (DESYREL) 50 MG tablet Take 1 tablet (50 mg total) by mouth at bedtime as needed for sleep. 12/06/14   Belva Crome, MD    Family History Family History  Problem Relation Age of Onset  . Coronary artery disease Other        CABG  . Coronary artery disease Mother   . Stroke Mother   . Heart disease Mother   . Diabetes Mother   . Diabetes Sister   . Diabetes Maternal Uncle        x2  . Alcohol abuse Other   . Diabetes Paternal Uncle        x2  . Colon cancer Paternal Uncle   . Colon polyps Neg Hx   . Esophageal cancer Neg Hx     Social History Social History   Tobacco Use  . Smoking status: Former Smoker    Packs/day: 0.50    Years: 10.00    Pack years: 5.00    Types: Cigarettes     Last attempt to quit: 04/06/1983    Years since quitting: 34.1  . Smokeless tobacco: Never Used  Substance Use Topics  . Alcohol use: Yes    Alcohol/week: 0.0 oz    Comment: `/26/2017 "might drink a beer q couple months mostly; summertime I might drink 2-3 beers/week"  . Drug use: No     Allergies   Statins and Imdur [isosorbide dinitrate]   Review of Systems Review of Systems  Constitutional: Negative for chills, fever and malaise/fatigue.  HENT: Negative for ear pain and sore throat.   Eyes: Negative for pain and visual disturbance.  Respiratory: Positive for cough, sputum production and shortness of breath. Negative for hemoptysis.   Cardiovascular: Positive for chest pain. Negative for palpitations, orthopnea, syncope and near-syncope.  Gastrointestinal: Negative for abdominal pain, nausea and vomiting.  Genitourinary: Negative for dysuria and hematuria.  Musculoskeletal: Negative for arthralgias and back pain.  Skin: Negative for color change and rash.  Neurological: Negative for seizures, syncope, weakness, numbness and headaches.  All other systems reviewed and are negative.    Physical Exam Updated Vital Signs BP (!) 183/92   Pulse (!) 59   Temp 98.7 F (37.1 C)   Resp 18   Wt 113.9 kg (251 lb)   SpO2 100%   BMI 36.01 kg/m   Physical Exam  Constitutional: He appears well-developed and well-nourished.  HENT:  Head: Normocephalic and atraumatic.  Eyes: Conjunctivae are normal.  Neck: Neck supple.  Cardiovascular: Normal rate and regular rhythm.  No murmur heard. Pulmonary/Chest: Effort normal and breath sounds normal. No respiratory distress.  Abdominal: Soft. There is no tenderness.  Musculoskeletal:       Right lower leg: Normal. He exhibits edema.       Left lower leg: Normal. He exhibits edema.  Neurological: He is alert.  Skin: Skin is warm and dry.  Psychiatric: He has a normal mood and affect.  Nursing note and vitals reviewed.    ED  Treatments / Results  Labs (all labs ordered are listed, but only abnormal results are displayed) Labs Reviewed  BASIC METABOLIC PANEL - Abnormal; Notable for the following components:      Result Value   Glucose, Bld 213 (*)    Calcium 8.8 (*)    All other components within normal limits  CBC  I-STAT TROPONIN, ED  I-STAT TROPONIN, ED     EKG  EKG Interpretation  Date/Time:  Wednesday May 18 2017 17:24:58 EST Ventricular Rate:  60 PR Interval:    QRS Duration: 97 QT Interval:  420 QTC Calculation: 420 R Axis:   10 Text Interpretation:  Sinus rhythm Prolonged PR interval Borderline T abnormalities, inferior leads Confirmed by Aletta Edouard [phone]) on 05/18/2017 5:27:20 PM       Radiology Dg Chest 2 View  Result Date: 05/18/2017 CLINICAL DATA:  Several days of persistent cough, onset of shortness of breath and burning pain in the neck and chest today. History of gastroesophageal reflux, coronary artery disease with stent placement, and diabetes. Former smoker. EXAM: CHEST  2 VIEW COMPARISON:  PA and lateral chest x-ray of November 05, 2014 FINDINGS: The lungs are adequately inflated. There is no focal infiltrate. There is no pleural effusion. The heart and pulmonary vascularity are normal. The mediastinum is normal in width. There calcification in the wall of the thoracic aorta. The bony thorax exhibits no acute abnormality. There are degenerative changes of the right AC joint. IMPRESSION: There is no pneumonia nor other acute cardiopulmonary abnormality. Thoracic aortic atherosclerosis. Electronically Signed   By: David  Martinique M.D.   On: 05/18/2017 13:38    Procedures .EKG Date/Time: 05/18/2017 5:28 PM Performed by: Hayden Rasmussen, MD Authorized by: Hayden Rasmussen, MD   Previous ECG:    Previous ECG:  Compared to current Interpretation:    Interpretation: non-specific   Quality:    Tracing quality:  Limited by artifact Rhythm:    Rhythm: sinus rhythm     Ectopy:    Ectopy: none   Conduction:    Conduction: normal   ST segments:    ST segments:  Normal T waves:    T waves: non-specific     (including critical care time)  Medications Ordered in ED Medications - No data to display   Initial Impression / Assessment and Plan / ED Course  I have reviewed the triage vital signs and the nursing notes.  Pertinent labs & imaging results that were available during my care of the patient were reviewed by me and considered in my medical decision making (see chart for details).  Clinical Course as of May 19 1104  Wed May 18, 2017  1543 ED EKG within 10 minutes [MB]    Clinical Course User Index [MB] Hayden Rasmussen, MD   67 year old male with prior history of coronary disease here with chest pain and throat tightness on awakening.  He is symptom-free now, his delta troponin is negative.  He is comfortable with being discharged and following up with his primary care doctor.  Final Clinical Impressions(s) / ED Diagnoses   Final diagnoses:  Chest pain in adult  Shortness of breath    ED Discharge Orders    None       Hayden Rasmussen, MD 05/19/17 1108

## 2017-05-18 NOTE — ED Triage Notes (Signed)
Pt presents to the ed with complaints of a productive cough x 3 days. States he started having chest pain and he thinks it is related to his cough however he is a heart patient and concerned about his heart. Pt in no apparent distress in triage.

## 2017-05-19 ENCOUNTER — Encounter (HOSPITAL_BASED_OUTPATIENT_CLINIC_OR_DEPARTMENT_OTHER): Payer: Federal, State, Local not specified - PPO

## 2017-05-23 ENCOUNTER — Other Ambulatory Visit: Payer: Self-pay | Admitting: Nurse Practitioner

## 2017-05-23 DIAGNOSIS — I10 Essential (primary) hypertension: Secondary | ICD-10-CM

## 2017-05-24 ENCOUNTER — Telehealth: Payer: Self-pay | Admitting: *Deleted

## 2017-05-24 NOTE — Telephone Encounter (Signed)
I will message Dr Radford Pax to order you a sleep aid if you do not already have one. Thanks,  Gae Bon ===View-only below this line===   ----- Message -----    From: [name]    Sent: 05/11/2017  9:10 AM EST      To: Ronald Merle, NP Subject: Non-Urgent Medical Question  I have sleep study 05/19/2017. I will not be able to sleep sound enough without sleep aid. over the counter may help sometimes it do not.

## 2017-05-24 NOTE — Telephone Encounter (Signed)
Hello my name is Gae Bon (Cpap assistant) I have forwarded your message to our insurance pre-cert department and I will not schedule your test until I hear back from you or them. Thank you, Gae Bon ===View-only below this line===   ----- Message -----    From: [name]    Sent: 05/16/2017  6:22 AM EST      To: Truitt Merle, NP Subject: Non-Urgent Medical Question  I have gotten approval from secondary insurance for sleep study. [employer] is my primary. do I need to clear approval from them if they have not been notified

## 2017-06-01 ENCOUNTER — Telehealth: Payer: Self-pay | Admitting: *Deleted

## 2017-06-01 NOTE — Telephone Encounter (Signed)
-----   Message from Almyra Free sent at 05/24/2017  3:16 PM EST ----- Regarding: RE: APPROVAL I have just spoken with [employer] and his plan does require precert. I have submitted and will let you know when I hear from them.  ----- Message ----- From: Freada Bergeron, CMA Sent: 05/24/2017  10:29 AM To: Almyra Free Subject: APPROVAL                                       This message came from the patient from me. I have not scheduled him yet. Patient says he will call [employer] and then call us back. Gae Bon  From: [name] Sent: 05/16/2017  6:22 AM To: [address] Triage Subject: Non-Urgent Medical Question           ----- Message from Mychart, Generic sent at 05/16/2017 6:22 AM EST -----  I have gotten approval from secondary insurance for sleep study. [employer] is my primary. do I need to clear approval from them if they have not been notified

## 2017-06-01 NOTE — Telephone Encounter (Signed)
Approved - okay to schedule  Lula Olszewski, CMA        Pt can be scheduled between 1/18 and 4/18. Let me know when scheduled and I will enter precert info.  Thanks,  Amy

## 2017-06-01 NOTE — Telephone Encounter (Signed)
Informed patient of upcoming sleep study and patient understanding was verbalized.

## 2017-06-03 NOTE — Telephone Encounter (Deleted)
RE: APPROVAL  Lula Olszewski, CMA        I have just spoken with [facility] and his plan does require precert. I have submitted and will let you know when I hear from them.

## 2017-06-03 NOTE — Telephone Encounter (Signed)
RE: APPROVAL  Lula Olszewski, CMA        Pt is approved by both [employer] and [employer]. Please schedule before 06/23/17 as the [employer] expires then. Let me know when scheduled and I will enter the precert info.  Thanks, Amy

## 2017-06-07 NOTE — Telephone Encounter (Addendum)
Patient understands his sleep study is scheduled for Tuesday June 21 2017. Patient understands his sleep study will be done at [facility] sleep lab. Patient understands he will receive a sleep packet in a week or so. Patient understands to call if he does not receive the sleep packet in a timely manner. Patient agrees with treatment and thanked me for call.

## 2017-06-10 ENCOUNTER — Telehealth: Payer: Self-pay | Admitting: *Deleted

## 2017-06-10 NOTE — Telephone Encounter (Signed)
Patient states he needs a sleep aide or he will not sleep for his test.  I have sleep study 05/19/2017. I will not be able to sleep sound enough without sleep aid. over the counter may help sometimes it do not.   Dr Radford Pax says the ordering provider or his PCP will have to order the sleep aide for patient.  Preferred pharmacy is [facility] , [address]

## 2017-06-10 NOTE — Telephone Encounter (Signed)
-----   Message from Sueanne Margarita, MD sent at 06/10/2017  4:02 PM EST ----- Regarding: RE: Sleep aide I have never seen patient so cannot order sleep aid ----- Message ----- From: Freada Bergeron, CMA Sent: 06/10/2017   8:12 AM To: Sueanne Margarita, MD Subject: Sleep aide                                     I will message Dr Radford Pax to order you a sleep aid if you do not already have one.  Thanks, Gae Bon

## 2017-06-13 NOTE — Telephone Encounter (Signed)
Per (DPR) allowed to leave instructions on vm.  Let pt know sleep aid will come from PCP.

## 2017-06-13 NOTE — Telephone Encounter (Signed)
?   Date wrong for sleep study.  I do not order sleep medicines - will need to get from PCP

## 2017-06-14 ENCOUNTER — Other Ambulatory Visit: Payer: Self-pay | Admitting: Interventional Cardiology

## 2017-06-17 ENCOUNTER — Ambulatory Visit (HOSPITAL_BASED_OUTPATIENT_CLINIC_OR_DEPARTMENT_OTHER): Payer: Federal, State, Local not specified - PPO | Attending: Cardiology

## 2017-06-21 ENCOUNTER — Encounter (HOSPITAL_BASED_OUTPATIENT_CLINIC_OR_DEPARTMENT_OTHER): Payer: Federal, State, Local not specified - PPO

## 2017-06-21 ENCOUNTER — Encounter (INDEPENDENT_AMBULATORY_CARE_PROVIDER_SITE_OTHER): Payer: Self-pay

## 2017-06-21 NOTE — Telephone Encounter (Signed)
Patient called today to say he missed his sleep study appointment on Friday 06/17/17 because he forgot his appointment. Patient's insurance will run out on 06/23/17 so patient states he will call back when he decides to retest.

## 2017-06-22 NOTE — Telephone Encounter (Signed)
lvm pt should call office to set up appointment next week.

## 2017-07-05 ENCOUNTER — Ambulatory Visit (INDEPENDENT_AMBULATORY_CARE_PROVIDER_SITE_OTHER): Payer: Federal, State, Local not specified - PPO | Admitting: Internal Medicine

## 2017-07-05 ENCOUNTER — Encounter: Payer: Self-pay | Admitting: Internal Medicine

## 2017-07-05 VITALS — BP 196/104 | HR 74 | Ht 70.0 in | Wt 253.6 lb

## 2017-07-05 DIAGNOSIS — E1159 Type 2 diabetes mellitus with other circulatory complications: Secondary | ICD-10-CM

## 2017-07-05 DIAGNOSIS — E785 Hyperlipidemia, unspecified: Secondary | ICD-10-CM

## 2017-07-05 LAB — POCT GLYCOSYLATED HEMOGLOBIN (HGB A1C): HEMOGLOBIN A1C: 8.9

## 2017-07-05 MED ORDER — INSULIN DETEMIR 100 UNIT/ML FLEXPEN: 16.0000 [IU] | PEN_INJECTOR | Freq: Every day | SUBCUTANEOUS | 11 refills | Status: DC

## 2017-07-05 NOTE — Patient Instructions (Signed)
Please continue: - Metformin 1000 mg 2x a day - Glipizide XL 5 mg 2x a day - break/crush the evening dose   Please restart: - Levemir 16 units at bedtime, increasing by 2 units every 2 days until am sugars <140  Please come back for a follow-up appointment in 3 months.

## 2017-07-05 NOTE — Progress Notes (Signed)
Patient ID: RUSTY VILLELLA, male   DOB: October 14, 1950, 67 y.o.   MRN: [id number]  HPI: [name] is a 67 y.o.-year-old male, returning for f/u for DM2, dx 2007, prev. insulin-dependent since 2014, uncontrolled, with complications (CAD - s/p NSTEMI 11/2014 - stent; ED). Last visit 7 months ago. PCP: Dr Odette Fraction.  He was in ED with CP 05/2017 >> no cardiac pathology.  In the last 2 mo: could not sleep at night and eating at night. Now improving. Sugars higher.  He recently stopped Amlodipine and nitrate 1 mo ago.  Last hemoglobin A1c was: Lab Results  Component Value Date   HGBA1C 7.4 12/10/2016   HGBA1C 7.9 09/09/2016   HGBA1C 8.4 06/24/2016   Pt is on a regimen of: - Metformin 1000 mg 2x a day - Glipizide XL 5 mg 2x a day -the second dose of the day is crushed He stopped Lantus as he mentions that his sugars in a.m. were better without it  He tried Toujeo 20 units at night but did not like it He did not start Januvia as suggested at last visit ("I don't think I need it"). He was Levemir 140 units in am >> decreased to 70 units>> stopped as this was too expensive for him He stopped Welchol 3.7g at bedtime a day. Stopped Farxiga 10 mg b/c frequent urination and urgency. He was on Glipizide in the past. He was on Metformin in the past >> no N/V.   Pt checks his sugars 1x a day: - am: 157-159 >>  130-150 >> 115-130 >> 180-200  - 2h after b'fast: n/c >> 128, 134 >> n/c - before lunch:   206 >> n/c >> 140s >> n/c - 2h after lunch: 115 >> n/c >> 140-157 >> n/c - before dinner:   120-130 >> 106-120 >> n/c  - 2h after dinner:   200-245 >> 150-180, 200 >> n/c - bedtime:  151 >> 127-180, 200 >> n/c - nighttime:94, 190-305 >> 148-216 >> n/c  Lowest sugar was 109 >> 150; he has hypoglycemia awareness in the 80s. Highest sugar was 200 >> 230.  -+ Mild d CKD, last BUN/creatinine:  09/07/2016: 13/0.9, glucose 133 Lab Results  Component Value Date   BUN 8 05/18/2017   CREATININE 0.88  05/18/2017  On losartan. -+ Hyperlipidemia- last set of lipids: 09/07/2016: 167/65/33/121 Lab Results  Component Value Date   CHOL 164 06/19/2015   HDL 38.40 (L) 06/19/2015   LDLCALC 114 (H) 06/19/2015   LDLDIRECT 168.0 12/18/2012   TRIG 61.0 06/19/2015   CHOLHDL 4 06/19/2015  On pravastatin 40 - last eye exam was in spring 2018: No DR, reportedly - He denies numbness and tingling in his feet.  Pt was admitted for CP in 11/2014: NSTEMI >> CAD >> PTCA >> stent placed.   Started steroid eye drops and Flonase.  ROS: Constitutional: no weight gain/no weight loss, + fatigue, no subjective hyperthermia, no subjective hypothermia Eyes: + blurry vision, no xerophthalmia ENT: + sore throat, no nodules palpated in throat, no dysphagia, no odynophagia, + hoarseness Cardiovascular: no CP/no SOB/no palpitations/no leg swelling Respiratory:+  cough/no SOB/no wheezing Gastrointestinal: no N/no V/no D/no C/no acid reflux Musculoskeletal: + muscle aches/+ joint aches Skin: no rashes, no hair loss Neurological: no tremors/no numbness/no tingling/no dizziness, + HA + diff with erections  I reviewed pt's medications, allergies, PMH, social hx, family hx, and changes were documented in the history of present illness. Otherwise, unchanged from my initial visit note.  PE: BP (!) 196/104   Pulse 74   Ht 5\' 10"  (1.778 m)   Wt 253 lb 9.6 oz (115 kg)   SpO2 98%   BMI 36.39 kg/m   Wt Readings from Last 3 Encounters:  07/05/17 253 lb 9.6 oz (115 kg)  05/18/17 251 lb (113.9 kg)  02/22/17 251 lb 12.8 oz (114.2 kg)   Constitutional: overweight, in NAD Eyes: PERRLA, EOMI, no exophthalmos ENT: moist mucous membranes, no thyromegaly, no cervical lymphadenopathy Cardiovascular: RRR, No MRG Respiratory: CTA B Gastrointestinal: abdomen soft, NT, ND, BS+ Musculoskeletal: no deformities, strength intact in all 4 Skin: moist, warm, no rashes Neurological: no tremor with outstretched hands, DTR normal  in all 4  ASSESSMENT: 1. DM2, insulin-dependent, uncontrolled, with complications - CAD, s/p NSTEMI, s/p stent 11/2014 - ED  2. Obesity class II BMI Classification:  < 18.5 underweight   18.5-24.9 normal weight   25.0-29.9 overweight   30.0-34.9 class I obesity   35.0-39.9 class II obesity   ? 40.0 class III obesity   3. HL   PLAN:  1. Patient with uncontrolled diabetes, with better control in the last year, after which he could come off Lantus.  His post dinner sugars are still high and we discussed at last visit how to improve his dinner, but he was still eating candy at night so sugars remained high. -We may need to switch to Januvia if he starts developing low blood sugars. -Of note, he has a history of being on 140 units of Levemir at one point in the past. - unfortunately, we need to add back a lower dose of basal insulin since his sugars are much higher at this visit.  He agrees with this.  We discussed that we may be able to stop this when his sugars improve  - he already start to go to bed at a more reasonable hour after he started trazodone, since before, he was going to bed at 6 AM and was eating throughout the night - I suggested to:  Patient Instructions  Please continue: - Metformin 1000 mg 2x a day - Glipizide XL 5 mg 2x a day - break/crush the evening dose   Please restart: - Levemir 16 units at bedtime, increasing by 2 units every 2 days until am sugars <140  Please come back for a follow-up appointment in 3 months.  - today, HbA1c is 8.9% (higher) - continue checking sugars at different times of the day - check 1x a day, rotating checks - advised for yearly eye exams >> he is UTD - Return to clinic in 3 mo with sugar log   2. Obesity - We again discussed about improving his diet  -he did stop eating at night  3. HL - reviewed Lipid panel from 09/2016: LDL 121, above goal - continues on Pravastatin 40 mg daily w/o SEs  [name], MD  PhD [facility]

## 2017-07-17 ENCOUNTER — Other Ambulatory Visit: Payer: Self-pay | Admitting: Internal Medicine

## 2017-08-25 ENCOUNTER — Encounter: Payer: Self-pay | Admitting: Family Medicine

## 2017-08-25 DIAGNOSIS — Z0000 Encounter for general adult medical examination without abnormal findings: Secondary | ICD-10-CM | POA: Diagnosis not present

## 2017-08-25 DIAGNOSIS — R82998 Other abnormal findings in urine: Secondary | ICD-10-CM | POA: Diagnosis not present

## 2017-08-25 DIAGNOSIS — E1151 Type 2 diabetes mellitus with diabetic peripheral angiopathy without gangrene: Secondary | ICD-10-CM | POA: Diagnosis not present

## 2017-08-25 DIAGNOSIS — E7800 Pure hypercholesterolemia, unspecified: Secondary | ICD-10-CM | POA: Diagnosis not present

## 2017-08-25 DIAGNOSIS — Z125 Encounter for screening for malignant neoplasm of prostate: Secondary | ICD-10-CM | POA: Diagnosis not present

## 2017-08-26 DIAGNOSIS — R972 Elevated prostate specific antigen [PSA]: Secondary | ICD-10-CM | POA: Diagnosis not present

## 2017-08-30 DIAGNOSIS — E1151 Type 2 diabetes mellitus with diabetic peripheral angiopathy without gangrene: Secondary | ICD-10-CM | POA: Diagnosis not present

## 2017-08-30 DIAGNOSIS — Z1389 Encounter for screening for other disorder: Secondary | ICD-10-CM | POA: Diagnosis not present

## 2017-08-30 DIAGNOSIS — Z0000 Encounter for general adult medical examination without abnormal findings: Secondary | ICD-10-CM | POA: Diagnosis not present

## 2017-08-30 DIAGNOSIS — I119 Hypertensive heart disease without heart failure: Secondary | ICD-10-CM | POA: Diagnosis not present

## 2017-08-30 DIAGNOSIS — I208 Other forms of angina pectoris: Secondary | ICD-10-CM | POA: Diagnosis not present

## 2017-08-30 DIAGNOSIS — R972 Elevated prostate specific antigen [PSA]: Secondary | ICD-10-CM | POA: Diagnosis not present

## 2017-09-06 DIAGNOSIS — Z1212 Encounter for screening for malignant neoplasm of rectum: Secondary | ICD-10-CM | POA: Diagnosis not present

## 2017-09-17 ENCOUNTER — Other Ambulatory Visit: Payer: Self-pay | Admitting: Interventional Cardiology

## 2017-09-27 ENCOUNTER — Ambulatory Visit: Payer: Self-pay | Admitting: Urology

## 2017-09-29 ENCOUNTER — Encounter: Payer: Self-pay | Admitting: Urology

## 2017-09-29 ENCOUNTER — Ambulatory Visit (INDEPENDENT_AMBULATORY_CARE_PROVIDER_SITE_OTHER): Payer: Federal, State, Local not specified - PPO | Admitting: Urology

## 2017-09-29 VITALS — BP 182/80 | HR 63 | Ht 70.0 in | Wt 245.0 lb

## 2017-09-29 DIAGNOSIS — R350 Frequency of micturition: Secondary | ICD-10-CM | POA: Diagnosis not present

## 2017-09-29 DIAGNOSIS — R972 Elevated prostate specific antigen [PSA]: Secondary | ICD-10-CM | POA: Diagnosis not present

## 2017-09-29 LAB — BLADDER SCAN AMB NON-IMAGING

## 2017-09-29 NOTE — Progress Notes (Signed)
09/29/2017 8:18 PM   [name] [birth year] [id number]  Referring provider: [name], MD [address] [place], [place] [address]  Chief Complaint  Patient presents with  . Elevated PSA    New Patient    HPI: 67 year old male referred for further evaluation of elevated PSA.  He was noted to have an elevated PSA of 5.8 ng/mL on 08/27/2017.  His free PSA was over 4.5%.  Review of records indicate that his PSA has risen steadily since 2011.  Please see PSA trend as below.  He does have an uncle with prostate cancer.    He was seen in 2017 by Dr. Baruch Gouty for urinary urgency, frequency and incontinence.  He was started on Vesicare.  This is since been changed to Myrbetriq 25 mg but stopped because his blood pressure rose.    His primary urinary complaint today is urinary urgency.  He has difficulty for postponing his urine and has to rush to get to the bathroom.  IPSS as below.  Symptoms are stable for many years.   IPSS    Row Name 09/29/17 1500         International Prostate Symptom Score   How often have you had the sensation of not emptying your bladder?  Not at All     How often have you had to urinate less than every two hours?  Less than 1 in 5 times     How often have you found you stopped and started again several times when you urinated?  Not at All     How often have you found it difficult to postpone urination?  More than half the time     How often have you had a weak urinary stream?  Not at All     How often have you had to strain to start urination?  Not at All     How many times did you typically get up at night to urinate?  1 Time     Total IPSS Score  6       Quality of Life due to urinary symptoms   If you were to spend the rest of your life with your urinary condition just the way it is now how would you feel about that?  Mostly Disatisfied        Score:  1-7 Mild 8-19 Moderate 20-35 Severe    PMH: Past Medical History:    Diagnosis Date  . Adenomatous colon polyp 2011  . Anemia   . Angina pectoris ([place]) 05/01/2015   med rx for 95% OM (u/a to access due to tortuosity), 95% D1 and other moderate CAD at cath  . Arthritis    "back, knees" (05/01/2015)  . Chest pain    pleuritic  . Chronic lower back pain   . Coronary artery disease   . Depression   . GERD (gastroesophageal reflux disease)   . HTN (hypertension)   . Hyperlipidemia   . Obstructive sleep apnea    noncompliant with CPAP  . Osteoarthritis   . Pneumonia ~ 2014 X 1  . Refusal of blood transfusions as patient is Jehovah's Witness   . Type II or unspecified type diabetes mellitus without mention of complication, not stated as uncontrolled     Surgical History: Past Surgical History:  Procedure Laterality Date  . CARDIAC CATHETERIZATION  2004   patent coronary arteries  . CARDIAC CATHETERIZATION  05/01/2015   "tried to put stent in but couldn't"  .  CARDIAC CATHETERIZATION N/A 05/01/2015   Procedure: Left Heart Cath and Coronary Angiography;  Surgeon: Belva Crome, MD; LAD 60%, oD1 90%, pD1 70%, D2 70%, CFX patent stent, 30% distal to prev stent, pRCA 20%, OM1 90/95%, NL LV  . CARDIAC CATHETERIZATION N/A 05/01/2015   Procedure: Coronary Stent Intervention;  Surgeon: Belva Crome, MD;  Unsuccessful PCI OM due to tortuosity  . CARDIAC CATHETERIZATION N/A 11/06/2014   Procedure: Left Heart Cath and Coronary Angiography;  Surgeon: Belva Crome, MD;  Location: [facility];  Service: Cardiovascular;  Laterality: N/A;  . CLOSED REDUCTION SHOULDER DISLOCATION Right ~ 1975   "& reattached muscle"  . COLONOSCOPY W/ BIOPSIES AND POLYPECTOMY  X 2  . ESOPHAGOGASTRODUODENOSCOPY ENDOSCOPY    . PILONIDAL CYST EXCISION      Home Medications:  Allergies as of 09/29/2017      Reactions   Statins Other (See Comments)   REACTION: joint pain Lipitor- headaches Has also tried Livalo, pravachol, zetia, crestor, welchol   Imdur [isosorbide Dinitrate]     Headache - "violent"      Medication List        Accurate as of 09/29/17  8:18 PM. Always use your most recent med list.          esomeprazole 40 MG capsule Commonly known as:  NEXIUM Take 40 mg by mouth daily as needed (heartburn or acid reflux).   fluticasone 50 MCG/ACT nasal spray Commonly known as:  FLONASE Place 1 spray into both nostrils daily as needed.   glipiZIDE 5 MG 24 hr tablet Commonly known as:  GLUCOTROL XL TAKE 1 TABLET BY MOUTH TWICE A DAY   glucose blood test strip Use as instructed to check sugar 2 times daily   hydrALAZINE 25 MG tablet Commonly known as:  APRESOLINE TAKE 1 TABLET BY MOUTH TWICE A DAY   Insulin Detemir 100 UNIT/ML Pen Commonly known as:  LEVEMIR FLEXTOUCH Inject 16 Units into the skin daily at 10 pm.   Insulin Pen Needle 32G X 4 MM Misc Commonly known as:  NOVOFINE PLUS Use 1x a day   latanoprost 0.005 % ophthalmic solution Commonly known as:  XALATAN Place 1 drop into both eyes at bedtime.   losartan-hydrochlorothiazide 100-25 MG tablet Commonly known as:  HYZAAR Take 1 tablet by mouth daily.   metFORMIN 500 MG tablet Commonly known as:  GLUCOPHAGE TAKE 2 TABLETS (1,000 MG TOTAL) BY MOUTH 2 (TWO) TIMES DAILY WITH A MEAL.   metoprolol tartrate 25 MG tablet Commonly known as:  LOPRESSOR TAKE 1 TABLET(25 MG) BY MOUTH IN AM AND TAKE 1/2 TABLET(12.5 MG) BY MOUTH IN PM   MYRBETRIQ 25 MG Tb24 tablet Generic drug:  mirabegron ER Take 25 mg by mouth daily.   nitroGLYCERIN 0.4 MG SL tablet Commonly known as:  NITROSTAT PLACE ONE TABLET UNDER THE TONGUE EVERY 5 MINUTES X3 DOSES AS NEEDED FOR CHEST PAIN   pravastatin 40 MG tablet Commonly known as:  PRAVACHOL Take 40 mg by mouth daily.   RELION PREMIER BLU MONITOR Devi 1 Device by Does not apply route daily.   traZODone 50 MG tablet Commonly known as:  DESYREL Take 1 tablet (50 mg total) by mouth at bedtime as needed for sleep.       Allergies:  Allergies   Allergen Reactions  . Statins Other (See Comments)    REACTION: joint pain Lipitor- headaches Has also tried Livalo, pravachol, zetia, crestor, welchol  . Imdur [Isosorbide Dinitrate]  Headache - "violent"    Family History: Family History  Problem Relation Age of Onset  . Coronary artery disease Other        CABG  . Coronary artery disease Mother   . Stroke Mother   . Heart disease Mother   . Diabetes Mother   . Diabetes Sister   . Diabetes Maternal Uncle        x2  . Alcohol abuse Other   . Diabetes Paternal Uncle        x2  . Colon cancer Paternal Uncle   . Colon polyps Neg Hx   . Esophageal cancer Neg Hx     Social History:  reports that he quit smoking about 34 years ago. His smoking use included cigarettes. He has a 5.00 pack-year smoking history. He has never used smokeless tobacco. He reports that he drinks alcohol. He reports that he does not use drugs.  ROS: UROLOGY Frequent Urination?: Yes Hard to postpone urination?: Yes Burning/pain with urination?: No Get up at night to urinate?: Yes Leakage of urine?: Yes Urine stream starts and stops?: No Trouble starting stream?: No Do you have to strain to urinate?: No Blood in urine?: No Urinary tract infection?: No Sexually transmitted disease?: No Injury to kidneys or bladder?: No Painful intercourse?: No Weak stream?: No Erection problems?: Yes Penile pain?: No  Gastrointestinal Nausea?: No Vomiting?: No Indigestion/heartburn?: Yes Diarrhea?: No Constipation?: No  Constitutional Fever: No Night sweats?: No Weight loss?: No Fatigue?: Yes  Skin Skin rash/lesions?: No Itching?: No  Eyes Blurred vision?: No Double vision?: No  Ears/Nose/Throat Sore throat?: No Sinus problems?: No  Hematologic/Lymphatic Swollen glands?: No Easy bruising?: No  Cardiovascular Leg swelling?: Yes Chest pain?: No  Respiratory Cough?: No Shortness of breath?: No  Endocrine Excessive thirst?:  No  Musculoskeletal Back pain?: Yes Joint pain?: Yes  Neurological Headaches?: No Dizziness?: No  Psychologic Depression?: Yes Anxiety?: Yes  Physical Exam: BP (!) 182/80   Pulse 63   Ht 5\' 10"  (1.778 m)   Wt 245 lb (111.1 kg)   BMI 35.15 kg/m   Constitutional:  Alert and oriented, No acute distress. HEENT: [place] AT, moist mucus membranes.  Trachea midline, no masses. Cardiovascular: No clubbing, cyanosis, or edema. Respiratory: Normal respiratory effort, no increased work of breathing. GI: Abdomen is soft, nontender, nondistended, no abdominal masses GU: No CVA tenderness Rectal: Normal sphincter tone.  Slightly enlarged prostate, questionable asymmetry at the left base although exam somewhat limited due to habitus. Skin: No rashes, bruises or suspicious lesions. Neurologic: Grossly intact, no focal deficits, moving all 4 extremities. Psychiatric: Normal mood and affect.  Laboratory Data: Lab Results  Component Value Date   WBC 6.5 05/18/2017   HGB 13.8 05/18/2017   HCT 42.4 05/18/2017   MCV 85.1 05/18/2017   PLT 216 05/18/2017    Lab Results  Component Value Date   CREATININE 0.88 05/18/2017    Lab Results  Component Value Date   PSA 3.53 04/19/2013   PSA 2.41 09/29/2011   PSA 1.51 10/23/2009    Lab Results  Component Value Date   TESTOSTERONE 408.15 05/20/2009    Lab Results  Component Value Date   HGBA1C 8.9 07/05/2017    Urinalysis N/a  Pertinent Imaging: n/a  Assessment & Plan:    1. Elevated PSA  We reviewed the implications of an elevated PSA and the uncertainty surrounding it. In general, a man's PSA increases with age and is produced by both normal and cancerous prostate  tissue. Differential for elevated PSA is BPH, prostate cancer, infection, recent intercourse/ejaculation, prostate infarction, recent urethroscopic manipulation (foley placement/cystoscopy) and prostatitis. Management of an elevated PSA can include observation or  prostate biopsy and wediscussed this in detail.  We discussed that indications for prostate biopsy are defined by age and race specific PSA cutoffs as well as a PSA velocity of 0.75/year.  Plan to repeat PSA today.  If remains elevated, strongly recommend prostate biopsy.  We discussed prostate biopsy in detail including the procedure itself, the risks of blood in the urine, stool, and ejaculate, serious infection, and discomfort. He is willing to proceed with this as discussed.  - PSA - Urinalysis, Complete - BLADDER SCAN AMB NON-IMAGING  2. Urinary frequency Likely related to underlying diabetes Previously tried Nurse, children's Will address further pending the above   Return for prostate biopsy.  [name], MD  [facility] [address], [place], [place] [address] [phone]

## 2017-09-30 ENCOUNTER — Telehealth: Payer: Self-pay

## 2017-09-30 LAB — URINALYSIS, COMPLETE
BILIRUBIN UA: NEGATIVE
Glucose, UA: NEGATIVE
Ketones, UA: NEGATIVE
Leukocytes, UA: NEGATIVE
Nitrite, UA: NEGATIVE
PROTEIN UA: NEGATIVE
SPEC GRAV UA: 1.025 (ref 1.005–1.030)
UUROB: 0.2 mg/dL (ref 0.2–1.0)
pH, UA: 5.5 (ref 5.0–7.5)

## 2017-09-30 LAB — PSA: Prostate Specific Ag, Serum: 5.5 ng/mL — ABNORMAL HIGH (ref 0.0–4.0)

## 2017-09-30 LAB — MICROSCOPIC EXAMINATION

## 2017-09-30 NOTE — Telephone Encounter (Signed)
-----   Message from [name], MD sent at 09/30/2017 10:40 AM EDT ----- PSA remains significantly elevated.  I would like you to keep your appointment for prostate biopsy as discussed in the office yesterday.  [name], MD

## 2017-09-30 NOTE — Telephone Encounter (Signed)
Left detailed message.   

## 2017-10-04 ENCOUNTER — Encounter: Payer: Self-pay | Admitting: Internal Medicine

## 2017-10-04 ENCOUNTER — Ambulatory Visit (INDEPENDENT_AMBULATORY_CARE_PROVIDER_SITE_OTHER): Payer: Federal, State, Local not specified - PPO | Admitting: Internal Medicine

## 2017-10-04 VITALS — BP 150/78 | HR 70 | Ht 70.0 in | Wt 250.2 lb

## 2017-10-04 DIAGNOSIS — E785 Hyperlipidemia, unspecified: Secondary | ICD-10-CM

## 2017-10-04 DIAGNOSIS — E1159 Type 2 diabetes mellitus with other circulatory complications: Secondary | ICD-10-CM | POA: Diagnosis not present

## 2017-10-04 LAB — POCT GLYCOSYLATED HEMOGLOBIN (HGB A1C): Hemoglobin A1C: 6.6 % — AB (ref 4.0–5.6)

## 2017-10-04 NOTE — Addendum Note (Signed)
Addended by: Daizy Outen on: 10/04/2017 02:33 PM   Modules accepted: Orders  

## 2017-10-04 NOTE — Progress Notes (Signed)
Patient ID: [name], male   DOB: [birth year], 67 y.o.   MRN: [id number]  HPI: [name] is a 67 y.o.-year-old male, returning for f/u for DM2, dx 2007, prev. insulin-dependent since 2014, uncontrolled, with complications (CAD - s/p NSTEMI 11/2014 - stent; ED). Last visit 3 months ago. PCP: Dr Odette Fraction.  Last hemoglobin A1c was: Lab Results  Component Value Date   HGBA1C 8.9 07/05/2017   HGBA1C 7.4 12/10/2016   HGBA1C 7.9 09/09/2016   Pt is on a regimen of: - Metformin 1000 mg 2x a day - Glipizide XL 5 mg 2x a day -the second dose of the day is crushed - Levemir 16 units at bedtime - added 07/2017 He stopped Lantus as he mentions that his sugars in a.m. were better without it  He tried Toujeo 20 units at night but did not like it He did not start Januvia as suggested at last visit ("I don't think I need it"). He was Levemir 140 units in am >> decreased to 70 units>> stopped as this was too expensive for him He stopped Welchol 3.7g at bedtime a day. Stopped Farxiga 10 mg b/c frequent urination and urgency. He was on Glipizide in the past. He was on Metformin in the past >> no N/V.   Pt checks his sugars 1x a day: - am:130-150 >> 115-130 >> 180-200 >> 106-130 - 2h after b'fast: n/c >> 128, 134 >> n/c - before lunch:   206 >> n/c >> 140s >> n/c - 2h after lunch: n/c >> 140-157 >> n/c - before dinner: 120-130 >> 106-120 >> n/c >> 80-130 - 2h after dinner: 200-245 >> 150-180, 200 >> n/c - bedtime:  151 >> 127-180, 200 >> n/c - nighttime:94, 190-305 >> 148-216 >> n/c  Lowest sugar was 150 >> 80; he has hypoglycemia awareness in the 80s. Highest sugar was 230 >> 200 (apple pie).  - + mild CKD, last BUN/creatinine:  Lab Results  Component Value Date   BUN 8 05/18/2017   CREATININE 0.88 05/18/2017  09/07/2016: 13/0.9, glucose 133 On losartan. - + HL; last set of lipids: Had recent labs by PCP.  Will ask for records. 09/07/2016: 167/65/33/121 Lab Results  Component  Value Date   CHOL 164 06/19/2015   HDL 38.40 (L) 06/19/2015   LDLCALC 114 (H) 06/19/2015   LDLDIRECT 168.0 12/18/2012   TRIG 61.0 06/19/2015   CHOLHDL 4 06/19/2015  On Pravastatin 40. - last eye exam was in 2019: No DR - no numbness and tingling in his feet.  Pt was admitted for CP in 11/2014: NSTEMI >> CAD >> PTCA >> stent placed.  He was in ED with CP 05/2017 >> no cardiac pathology.  ROS: Constitutional: no weight gain/no weight loss, no fatigue, no subjective hyperthermia, no subjective hypothermia, + increased uriation Eyes: no blurry vision, no xerophthalmia ENT: no sore throat, no nodules palpated in throat, no dysphagia, no odynophagia, + hoarseness Cardiovascular: no CP/no SOB/no palpitations/+ leg swelling Respiratory: no cough/no SOB/no wheezing Gastrointestinal: no N/no V/no D/no C/+ acid reflux Musculoskeletal: + muscle aches/+ joint aches Skin: no rashes, + hair loss Neurological: no tremors/no numbness/no tingling/no dizziness  I reviewed pt's medications, allergies, PMH, social hx, family hx, and changes were documented in the history of present illness. Otherwise, unchanged from my initial visit note.  Past Medical History:  Diagnosis Date  . Adenomatous colon polyp 2011  . Anemia   . Angina pectoris ([place]) 05/01/2015   med rx for 95% OM (  u/a to access due to tortuosity), 95% D1 and other moderate CAD at cath  . Arthritis    "back, knees" (05/01/2015)  . Chest pain    pleuritic  . Chronic lower back pain   . Coronary artery disease   . Depression   . GERD (gastroesophageal reflux disease)   . HTN (hypertension)   . Hyperlipidemia   . Obstructive sleep apnea    noncompliant with CPAP  . Osteoarthritis   . Pneumonia ~ 2014 X 1  . Refusal of blood transfusions as patient is Jehovah's Witness   . Type II or unspecified type diabetes mellitus without mention of complication, not stated as uncontrolled    Past Surgical History:  Procedure Laterality Date   . CARDIAC CATHETERIZATION  2004   patent coronary arteries  . CARDIAC CATHETERIZATION  05/01/2015   "tried to put stent in but couldn't"  . CARDIAC CATHETERIZATION N/A 05/01/2015   Procedure: Left Heart Cath and Coronary Angiography;  Surgeon: Belva Crome, MD; LAD 60%, oD1 90%, pD1 70%, D2 70%, CFX patent stent, 30% distal to prev stent, pRCA 20%, OM1 90/95%, NL LV  . CARDIAC CATHETERIZATION N/A 05/01/2015   Procedure: Coronary Stent Intervention;  Surgeon: Belva Crome, MD;  Unsuccessful PCI OM due to tortuosity  . CARDIAC CATHETERIZATION N/A 11/06/2014   Procedure: Left Heart Cath and Coronary Angiography;  Surgeon: Belva Crome, MD;  Location: [facility];  Service: Cardiovascular;  Laterality: N/A;  . CLOSED REDUCTION SHOULDER DISLOCATION Right ~ 1975   "& reattached muscle"  . COLONOSCOPY W/ BIOPSIES AND POLYPECTOMY  X 2  . ESOPHAGOGASTRODUODENOSCOPY ENDOSCOPY    . PILONIDAL CYST EXCISION     Social History   Socioeconomic History  . Marital status: Married    Spouse name: Not on file  . Number of children: 4  . Years of education: Not on file  . Highest education level: Not on file  Occupational History  . Occupation: Retired    Fish farm manager: UNEMPLOYED  Social Needs  . Financial resource strain: Not on file  . Food insecurity:    Worry: Not on file    Inability: Not on file  . Transportation needs:    Medical: Not on file    Non-medical: Not on file  Tobacco Use  . Smoking status: Former Smoker    Packs/day: 0.50    Years: 10.00    Pack years: 5.00    Types: Cigarettes    Last attempt to quit: 04/06/1983    Years since quitting: 34.5  . Smokeless tobacco: Never Used  Substance and Sexual Activity  . Alcohol use: Yes    Alcohol/week: 0.0 oz    Comment: `/26/2017 "might drink a beer q couple months mostly; summertime I might drink 2-3 beers/week"  . Drug use: No  . Sexual activity: Not Currently  Lifestyle  . Physical activity:    Days per week: Not on file     Minutes per session: Not on file  . Stress: Not on file  Relationships  . Social connections:    Talks on phone: Not on file    Gets together: Not on file    Attends religious service: Not on file    Active member of club or organization: Not on file    Attends meetings of clubs or organizations: Not on file    Relationship status: Not on file  . Intimate partner violence:    Fear of current or ex partner: Not on  file    Emotionally abused: Not on file    Physically abused: Not on file    Forced sexual activity: Not on file  Other Topics Concern  . Not on file  Social History Narrative   Lives in [place] with spouse Teigen Bellin).  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   Current Outpatient Medications on File Prior to Visit  Medication Sig Dispense Refill  . Blood Glucose Monitoring Suppl (RELION PREMIER BLU MONITOR) DEVI 1 Device by Does not apply route daily. 1 Device 0  . esomeprazole (NEXIUM) 40 MG capsule Take 40 mg by mouth daily as needed (heartburn or acid reflux).    . fluticasone (FLONASE) 50 MCG/ACT nasal spray Place 1 spray into both nostrils daily as needed.    Marland Kitchen glipiZIDE (GLUCOTROL XL) 5 MG 24 hr tablet TAKE 1 TABLET BY MOUTH TWICE A DAY 180 tablet 0  . glucose blood test strip Use as instructed to check sugar 2 times daily 200 each 5  . hydrALAZINE (APRESOLINE) 25 MG tablet TAKE 1 TABLET BY MOUTH TWICE A DAY 60 tablet 5  . Insulin Detemir (LEVEMIR FLEXTOUCH) 100 UNIT/ML Pen Inject 16 Units into the skin daily at 10 pm. 15 mL 11  . Insulin Pen Needle (NOVOFINE PLUS) 32G X 4 MM MISC Use 1x a day 100 each 11  . latanoprost (XALATAN) 0.005 % ophthalmic solution Place 1 drop into both eyes at bedtime.  5  . losartan-hydrochlorothiazide (HYZAAR) 100-25 MG tablet Take 1 tablet by mouth daily. 90 tablet 3  . metFORMIN (GLUCOPHAGE) 500 MG tablet TAKE 2 TABLETS (1,000 MG TOTAL) BY MOUTH 2 (TWO) TIMES DAILY WITH A MEAL.  360 tablet 0  . metoprolol tartrate (LOPRESSOR) 25 MG tablet TAKE 1 TABLET(25 MG) BY MOUTH IN AM AND TAKE 1/2 TABLET(12.5 MG) BY MOUTH IN PM 135 tablet 1  . mirabegron ER (MYRBETRIQ) 25 MG TB24 tablet Take 25 mg by mouth daily.    . nitroGLYCERIN (NITROSTAT) 0.4 MG SL tablet PLACE ONE TABLET UNDER THE TONGUE EVERY 5 MINUTES X3 DOSES AS NEEDED FOR CHEST PAIN 75 tablet 1  . pravastatin (PRAVACHOL) 40 MG tablet Take 40 mg by mouth daily.  3  . traZODone (DESYREL) 50 MG tablet Take 1 tablet (50 mg total) by mouth at bedtime as needed for sleep. 30 tablet 3   No current facility-administered medications on file prior to visit.    Allergies  Allergen Reactions  . Statins Other (See Comments)    REACTION: joint pain Lipitor- headaches Has also tried Livalo, pravachol, zetia, crestor, welchol  . Imdur [Isosorbide Dinitrate]     Headache - "violent"   Family History  Problem Relation Age of Onset  . Coronary artery disease Other        CABG  . Coronary artery disease Mother   . Stroke Mother   . Heart disease Mother   . Diabetes Mother   . Diabetes Sister   . Diabetes Maternal Uncle        x2  . Alcohol abuse Other   . Diabetes Paternal Uncle        x2  . Colon cancer Paternal Uncle   . Colon polyps Neg Hx   . Esophageal cancer Neg Hx     PE: BP (!) 150/78   Pulse 70   Ht 5\' 10"  (1.778 m)   Wt 250 lb 3.2 oz (113.5 kg)   SpO2 97%  BMI 35.90 kg/m   Wt Readings from Last 3 Encounters:  10/04/17 250 lb 3.2 oz (113.5 kg)  09/29/17 245 lb (111.1 kg)  07/05/17 253 lb 9.6 oz (115 kg)   Constitutional: overweight, in NAD Eyes: PERRLA, EOMI, no exophthalmos ENT: moist mucous membranes, no thyromegaly, no cervical lymphadenopathy Cardiovascular: RRR, No MRG Respiratory: CTA B Gastrointestinal: abdomen soft, NT, ND, BS+ Musculoskeletal: no deformities, strength intact in all 4 Skin: moist, warm, no rashes Neurological: no tremor with outstretched hands, DTR normal in all  4  ASSESSMENT: 1. DM2, insulin-dependent, uncontrolled, with complications - CAD, s/p NSTEMI, s/p stent 11/2014 - ED  2. Obesity class II BMI Classification:  < 18.5 underweight   18.5-24.9 normal weight   25.0-29.9 overweight   30.0-34.9 class I obesity   35.0-39.9 class II obesity   ? 40.0 class III obesity   3. HL   PLAN:  1. Patient with uncontrolled diabetes, with better control in last 2 years, after which she could come off long-acting insulin.  At one point in the past, he was taking up to 140 units of Levemir per day.   - However, at last visit, sugars were higher and we had to add back insulin: Levemir 16 units at bedtime, as sugars were much higher.  We did discuss at that point that if his sugars improve, he can come off the insulin again. - At this visit, sugars are much better after starting insulin, in fact, they are almost all at goal.  He had few hyperglycemic spikes after eating sweets.  He does not have any side effects from insulin.  He agrees to continue this for now. - I suggested to:  Patient Instructions  Please continue: - Metformin 1000 mg 2x a day - Glipizide XL 5 mg 2x a day - break/crush the evening dose  - Levemir 16 units at bedtime  Please come back for a follow-up appointment in 4 months.  - today, HbA1c is 6.6% (much better!) - continue checking sugars at different times of the day - check 1-2x a day, rotating checks - advised for yearly eye exams >> he is UTD - Return to clinic in 4 mo with sugar log   2. Obesity - down 3 lbs despite adding insulin  3. HL - Reviewed latest lipid panel from 09/2016: LDL above goal - Continues Pravastatin without side effects.   - Will last for recent records from PCP.  [name], MD PhD [facility]

## 2017-10-04 NOTE — Patient Instructions (Signed)
Please continue: - Metformin 1000 mg 2x a day - Glipizide XL 5 mg 2x a day - break/crush the evening dose  - Levemir 16 units at bedtime  Please come back for a follow-up appointment in 4 months.

## 2017-10-07 ENCOUNTER — Telehealth: Payer: Self-pay

## 2017-10-07 ENCOUNTER — Encounter: Payer: Self-pay | Admitting: Cardiology

## 2017-10-07 ENCOUNTER — Encounter: Payer: Self-pay | Admitting: *Deleted

## 2017-10-07 NOTE — Telephone Encounter (Signed)
Call placed to Pt.  Pt has been having a heaviness in his neck and shoulders for a few weeks-describes feeling like he had been doing strenuous exercise with his arms/shoulders.  He states this was how he felt before he had his last catheterization with stents.   Pt states he took a nitro tab recently and feels better now.  But states heaviness comes back after the nitro wears off.  Spoke with DOD.  Advised to make appt for Pt next week and send to ER if symptoms worsen.  Pt scheduled with APP for 10/10/2017.  Pt indicates understanding.  Pt states will go to ER if symptoms get worse.   No further action needed at this time.

## 2017-10-10 ENCOUNTER — Ambulatory Visit: Payer: Federal, State, Local not specified - PPO | Admitting: Cardiology

## 2017-10-10 NOTE — Progress Notes (Deleted)
Cardiology Office Note:    Date:  10/10/2017   ID:  Ronald, Brock 09-19-1950, MRN [id number]  PCP:  [name], MD  Cardiologist:  [name], MD  Referring MD: [name], MD   No chief complaint on file. ***  History of Present Illness:    [name] is a 67 y.o. male with a past medical history significant for CAD status post NSTEMI with DES to mid circumflex and angioplasty to first OM and medical therapy for stenoses of OM #1, diagonal #1 and diagonal #2 11/06/2014.  Cardiac catheterization 05/01/2015 showed restenosis after angioplasty of the first OM and widely patent circumflex stent.  He also has history of PAF, GERD, hypertension, hyperlipidemia, obstructive sleep apnea not compliant with CPAP, type 2 diabetes and being Jehovah's Witness.   Last seen in the office on 02/22/2017 by Truitt Merle, NP at which time he was noted to have stopped his M door due to headache.  He was also taking amlodipine every now and then, not regularly due to leg swelling apparently.  He comes in today for evaluation of heaviness to the neck and shoulders   ---------------------------------  CAD: History ofNSTEMI with DES to mid circumflex and angioplasty to first OM and medical therapy for stenoses of OM #1, diagonal #1 and diagonal #2 11/06/2014.  Cardiac catheterization 05/01/2015 showed restenosis after angioplasty of the first OM and widely patent circumflex stent.  On aspirin and statin.  On beta-blocker and has been unable to tolerate higher doses due to bradycardia.  Did not tolerate Imdur due to headache.  Uses sublingual nitroglycerin.  Hypertension: Unable to tolerate amlodipine in the past due to leg swelling.  Hydralazine 25 mg twice daily was added in November.  On beta-blocker and has been unable to tolerate higher doses due to bradycardia.  Continues on losartan-hydrochlorothiazide 100-25 mg daily  Hyperlipidemia: On Pravachol 30 mg daily.  Last lipid panel  in epic was 06/19/2015 indicating LDL of 114.  Will check lipid panel today.  OSA: Patient missed his sleep study appointment  Past Medical History:  Diagnosis Date  . Adenomatous colon polyp 2011  . Anemia   . Angina pectoris ([place]) 05/01/2015   med rx for 95% OM (u/a to access due to tortuosity), 95% D1 and other moderate CAD at cath  . Arthritis    "back, knees" (05/01/2015)  . Chest pain    pleuritic  . Chronic lower back pain   . Coronary artery disease   . Depression   . GERD (gastroesophageal reflux disease)   . HTN (hypertension)   . Hyperlipidemia   . Obstructive sleep apnea    noncompliant with CPAP  . Osteoarthritis   . Pneumonia ~ 2014 X 1  . Refusal of blood transfusions as patient is Jehovah's Witness   . Type II or unspecified type diabetes mellitus without mention of complication, not stated as uncontrolled     Past Surgical History:  Procedure Laterality Date  . CARDIAC CATHETERIZATION  2004   patent coronary arteries  . CARDIAC CATHETERIZATION  05/01/2015   "tried to put stent in but couldn't"  . CARDIAC CATHETERIZATION N/A 05/01/2015   Procedure: Left Heart Cath and Coronary Angiography;  Surgeon: Belva Crome, MD; LAD 60%, oD1 90%, pD1 70%, D2 70%, CFX patent stent, 30% distal to prev stent, pRCA 20%, OM1 90/95%, NL LV  . CARDIAC CATHETERIZATION N/A 05/01/2015   Procedure: Coronary Stent Intervention;  Surgeon: Belva Crome,  MD;  Unsuccessful PCI OM due to tortuosity  . CARDIAC CATHETERIZATION N/A 11/06/2014   Procedure: Left Heart Cath and Coronary Angiography;  Surgeon: Belva Crome, MD;  Location: [facility];  Service: Cardiovascular;  Laterality: N/A;  . CLOSED REDUCTION SHOULDER DISLOCATION Right ~ 1975   "& reattached muscle"  . COLONOSCOPY W/ BIOPSIES AND POLYPECTOMY  X 2  . ESOPHAGOGASTRODUODENOSCOPY ENDOSCOPY    . PILONIDAL CYST EXCISION      Current Medications: No outpatient medications have been marked as taking for the 10/10/17  encounter (Appointment) with Daune Perch, NP.     Allergies:   Statins and Imdur [isosorbide dinitrate]   Social History   Socioeconomic History  . Marital status: Married    Spouse name: Not on file  . Number of children: 4  . Years of education: Not on file  . Highest education level: Not on file  Occupational History  . Occupation: Retired    Fish farm manager: UNEMPLOYED  Social Needs  . Financial resource strain: Not on file  . Food insecurity:    Worry: Not on file    Inability: Not on file  . Transportation needs:    Medical: Not on file    Non-medical: Not on file  Tobacco Use  . Smoking status: Former Smoker    Packs/day: 0.50    Years: 10.00    Pack years: 5.00    Types: Cigarettes    Last attempt to quit: 04/06/1983    Years since quitting: 34.5  . Smokeless tobacco: Never Used  Substance and Sexual Activity  . Alcohol use: Yes    Alcohol/week: 0.0 oz    Comment: `/26/2017 "might drink a beer q couple months mostly; summertime I might drink 2-3 beers/week"  . Drug use: No  . Sexual activity: Not Currently  Lifestyle  . Physical activity:    Days per week: Not on file    Minutes per session: Not on file  . Stress: Not on file  Relationships  . Social connections:    Talks on phone: Not on file    Gets together: Not on file    Attends religious service: Not on file    Active member of club or organization: Not on file    Attends meetings of clubs or organizations: Not on file    Relationship status: Not on file  Other Topics Concern  . Not on file  Social History Narrative   Lives in [place] with spouse Zyron Deeley).  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     Family History: The patient's ***family history includes Alcohol abuse in his other; Colon cancer in his paternal uncle; Coronary artery disease in his mother and other; Diabetes in his maternal uncle, mother, paternal uncle, and sister; Heart  disease in his mother; Other in his father; Stroke in his mother. There is no history of Colon polyps or Esophageal cancer. ROS:   Please see the history of present illness.    *** All other systems reviewed and are negative.  EKGs/Labs/Other Studies Reviewed:    The following studies were reviewed today:  Echocardiogram 03/08/2016 Study Conclusions - Left ventricle: The cavity size was normal. Wall thickness was   increased in a pattern of mild LVH. Systolic function was normal.   The estimated ejection fraction was in the range of 55% to 60%.   Wall motion was normal; there were no regional wall motion  abnormalities. Doppler parameters are consistent with abnormal   left ventricular relaxation (grade 1 diastolic dysfunction). - Aorta: Aortic root dimension: 41 mm (ED). - Ascending aorta: The ascending aorta was mildly dilated. - Mitral valve: Moderately calcified annulus. There was systolic   anterior motion of the chordal structures. - Left atrium: The atrium was moderately dilated.  Impressions: - Compared to the prior study, there has been no significant   interval change.  Holter monitor 11/07/2015  Basic underlying rhythm is NSR with PAC's  Frequent PAC's are noted.  Frequent bouts of atrial tach at >170 bpm for 2-5 beats with blocked conduction.  Average HR 75 bpm with range 46 to 135 bpm  Brief bouts of sustained tachycardia, possibly atrial fib at > 150 bpm.  No diary Brief bouts of atrial fib. Predominant sinus rhythm. Occasional sinus bradycardia. Rare PVC's.  Coronary Stent Intervention  Left Heart Cath and Coronary Angiography 05/01/2015  Conclusion  1. 1st Diag lesion, 70% stenosed. 2. Ost 1st Diag to 1st Diag lesion, 70% stenosed. 3. 2nd Diag lesion, 75% stenosed. 4. Mid LAD to Dist LAD lesion, 60% stenosed. 5. Prox RCA to Dist RCA lesion, 25% stenosed. 6. Dist Cx lesion, 30% stenosed. 7. Ost 1st Diag lesion, 90% stenosed. 8. 1st Mrg-1 lesion, 95%  stenosed. 9. 1st Mrg-2 lesion, 95% stenosed. Post intervention, there is a 95% residual stenosis. The lesion was previously treated with angioplasty.    Continued widely patent mid circumflex stent.  95% stenosis in the mid body of the first obtuse marginal, representing restenosis after angioplasty in August reduced the lesion to 50%.  90% ostial first diagonal  Otherwise no change in anatomy compared to the prior post PCI angiogram  Normal LV function  Failed first obtuse marginal PTCA due to inability to cross the stenosis. Angulation in the vessel prevented torque ability and we will unable to solve lack of intra-coronary support.  Recommendation:  Continue medical therapy  Monitor overnight and discharge in a.m.  No change in medical regimen  Resume phase II cardiac rehabilitation    Left Heart Cath and Coronary Angiography  11/06/2014  Conclusion  1. 1st Diag lesion, 70% stenosed. 2. Ost 1st Diag, 70% stenosed. 3. 2nd Diag lesion, 75% stenosed. 4. Mid LAD to Dist LAD lesion, 60% stenosed. 5. Prox RCA to Dist RCA lesion, 25% stenosed. 6. Dist Cx lesion, 30% stenosed. 7. Mid Cx to Dist Cx lesion, 95% stenosed. There is a 0% residual stenosis post intervention. The lesion was previously treated with a stent (unknown type) . 8. A drug-eluting stent was placed. 9. 1st Mrg lesion, 85% stenosed. There is a 50% residual stenosis post intervention.    Non-ST elevation myocardial infarction due to high-grade obstruction in the mid circumflex. Also noted is significant stenosis in an angulated and tortuous moderate-sized first obtuse marginal, moderately severe first and second diagonal artery stenoses, and moderate mid LAD stenosis.there are mild luminal irregularities throughout the right coronary and in the left main.  Successful drug-eluting stent implantation in the mid circumflex reducing a 95% stenosis to 0%.  Angioplasty of the first obtuse marginal reducing stenosis  from 85% to 50%. Due to angulation and tortuosity, we will unable to deliver a stent. Also possible that wrapped wire on circumflex buddy prevented stent delivery.  Normal left ventricular function, EF 55%  Recommendations:  Aggressive risk factor modification  Medical therapy for obtuse marginal #1, diagonal #1, and diagonal #2 .  Potential discharge in a.m. assuming no ischemic symptoms overnight.  If PCI on the obtuse marginal #1 becomes necessary, more aggressive guide support will be needed if in the right radial was suggest a 4 cm CLS or XB. Could also consider femoral approach. May require buddy wire in the OM to accomplish.  Dual antiplatelets therapy for 6-12 months     EKG:  EKG is *** ordered today.  The ekg ordered today demonstrates ***  Recent Labs: 05/18/2017: BUN 8; Creatinine, Ser 0.88; Hemoglobin 13.8; Platelets 216; Potassium 3.9; Sodium 138   Recent Lipid Panel    Component Value Date/Time   CHOL 164 06/19/2015 1624   TRIG 61.0 06/19/2015 1624   HDL 38.40 (L) 06/19/2015 1624   CHOLHDL 4 06/19/2015 1624   VLDL 12.2 06/19/2015 1624   LDLCALC 114 (H) 06/19/2015 1624   LDLDIRECT 168.0 12/18/2012 1340    Physical Exam:    VS:  There were no vitals taken for this visit.    Wt Readings from Last 3 Encounters:  10/04/17 250 lb 3.2 oz (113.5 kg)  09/29/17 245 lb (111.1 kg)  07/05/17 253 lb 9.6 oz (115 kg)     Physical Exam***   ASSESSMENT:    1. Coronary artery disease involving native coronary artery of native heart with angina pectoris ([place])   2. Essential hypertension   3. Obstructive sleep apnea   4. Hyperlipidemia LDL goal <70    PLAN:    In order of problems listed above:  1. ***   Medication Adjustments/Labs and Tests Ordered: Current medicines are reviewed at length with the patient today.  Concerns regarding medicines are outlined above. Labs and tests ordered and medication changes are outlined in the patient instructions below:    There are no Patient Instructions on file for this visit.   Signed, Daune Perch, NP  10/10/2017 5:02 AM    [place]

## 2017-10-12 ENCOUNTER — Encounter: Payer: Self-pay | Admitting: Interventional Cardiology

## 2017-10-14 ENCOUNTER — Telehealth: Payer: Self-pay | Admitting: Interventional Cardiology

## 2017-10-14 NOTE — Telephone Encounter (Signed)
Spoke with pt and advised what we would do at appt.  Pt verbalized understanding.

## 2017-10-14 NOTE — Telephone Encounter (Signed)
New Message:      Pt is wanting to know what will be done on his appt. The pt missed an appt on 10/10/17 and now has a new appt on 11/28/17

## 2017-11-15 ENCOUNTER — Encounter

## 2017-11-15 ENCOUNTER — Other Ambulatory Visit: Payer: Self-pay | Admitting: Urology

## 2017-11-15 ENCOUNTER — Ambulatory Visit (INDEPENDENT_AMBULATORY_CARE_PROVIDER_SITE_OTHER): Payer: PPO | Admitting: Urology

## 2017-11-15 ENCOUNTER — Encounter: Payer: Self-pay | Admitting: Urology

## 2017-11-15 VITALS — BP 156/81 | HR 69 | Ht 70.0 in | Wt 247.0 lb

## 2017-11-15 DIAGNOSIS — C61 Malignant neoplasm of prostate: Secondary | ICD-10-CM | POA: Diagnosis not present

## 2017-11-15 DIAGNOSIS — R350 Frequency of micturition: Secondary | ICD-10-CM

## 2017-11-15 DIAGNOSIS — R972 Elevated prostate specific antigen [PSA]: Secondary | ICD-10-CM

## 2017-11-15 MED ORDER — LEVOFLOXACIN 500 MG PO TABS
500.0000 mg | ORAL_TABLET | Freq: Once | ORAL | Status: AC
  Administered 2017-11-15: 500 mg via ORAL

## 2017-11-15 MED ORDER — GENTAMICIN SULFATE 40 MG/ML IJ SOLN
80.0000 mg | Freq: Once | INTRAMUSCULAR | Status: AC
  Administered 2017-11-15: 80 mg via INTRAMUSCULAR

## 2017-11-15 NOTE — Progress Notes (Signed)
   11/15/17  CC:  Chief Complaint  Patient presents with  . Prostate Biopsy    HPI: 67 year old African-American male with a rising PSA, most recently up to 5.8.  He presents today for prostate biopsy.  He does also have a personal history of poorly controlled urinary frequency/urgency.  Blood pressure (!) 156/81, pulse 69, height 5\' 10"  (1.778 m), weight 247 lb (112 kg). NED. A&Ox3.   No respiratory distress   Abd soft, NT, ND Normal sphincter tone  Prostate Biopsy Procedure   Informed consent was obtained after discussing risks/benefits of the procedure.  A time out was performed to ensure correct patient identity.  Pre-Procedure: - Last PSA Level:  Lab Results  Component Value Date   PSA 3.53 04/19/2013   PSA 2.41 09/29/2011   PSA 1.51 10/23/2009   - Gentamicin given prophylactically - Levaquin 500 mg administered PO -Transrectal Ultrasound performed revealing a 31.7 gm prostate -No significant hypoechoic or median lobe noted  Procedure: - Prostate block performed using 10 cc 1% lidocaine and biopsies taken from sextant areas, a total of 12 under ultrasound guidance.  Post-Procedure: - Patient tolerated the procedure well - He was counseled to seek immediate medical attention if experiences any severe pain, significant bleeding, or fevers - Return in one week to discuss biopsy results  Assessment/ Plan:  1. Elevated PSA Status post uncomplicated biopsy today Warning symptoms reviewed Follow-up as scheduled - levofloxacin (LEVAQUIN) tablet 500 mg - gentamicin (GARAMYCIN) injection 80 mg  2. Urinary frequency We will discuss this further at follow-up visit pending his prostate biopsy results  [name], MD

## 2017-11-22 ENCOUNTER — Other Ambulatory Visit: Payer: Self-pay | Admitting: Urology

## 2017-11-22 LAB — PATHOLOGY REPORT

## 2017-11-23 ENCOUNTER — Ambulatory Visit (INDEPENDENT_AMBULATORY_CARE_PROVIDER_SITE_OTHER): Payer: Federal, State, Local not specified - PPO | Admitting: Gastroenterology

## 2017-11-23 ENCOUNTER — Encounter: Payer: Self-pay | Admitting: Gastroenterology

## 2017-11-23 VITALS — BP 134/80 | HR 76 | Ht 70.0 in | Wt 253.5 lb

## 2017-11-23 DIAGNOSIS — R195 Other fecal abnormalities: Secondary | ICD-10-CM

## 2017-11-23 DIAGNOSIS — K921 Melena: Secondary | ICD-10-CM

## 2017-11-23 NOTE — Progress Notes (Signed)
Review of pertinent gastrointestinal problems: 1. Adenomatous polyp: colonoscopy 12/2009 [name] done for routine risk screening; 4mm polyp, recommended recall at 5 years. 07/2014 Colonoscopy was normal except small hemorrhoids. He was recommended to have repeat colon cancer screening in 10 years, no sooner is necessary. 2. Left sided colon diverticulosis, noted on colonoscopy above. 3. Reflux related esophagitis (EGD 07/2014) and benign GE junction stricture, dilated.  His PPI was increased to BID.   HPI: This is a very pleasant 67 year old man who was referred to me by Tisovec, Fransico Him, MD  to evaluate Hemoccult positive stool.    Chief complaint is Hemoccult positive stool   He tells me that as part of an annual physical his primary care physician arranged for home stool testing and that it was positive for microscopic blood he never sees blood in his stool.  He has had no significant changes in his bowels.  He does not have a family history of colon cancer.  His weight is been overall stable.     Review of systems: Pertinent positive and negative review of systems were noted in the above HPI section. All other review negative.   Past Medical History:  Diagnosis Date  . Adenomatous colon polyp 2011  . Anemia   . Angina pectoris ([place]) 05/01/2015   med rx for 95% OM (u/a to access due to tortuosity), 95% D1 and other moderate CAD at cath  . Arthritis    "back, knees" (05/01/2015)  . Chest pain    pleuritic  . Chronic lower back pain   . Coronary artery disease   . Depression   . GERD (gastroesophageal reflux disease)   . HTN (hypertension)   . Hyperlipidemia   . Obstructive sleep apnea    noncompliant with CPAP  . Osteoarthritis   . Pneumonia ~ 2014 X 1  . Refusal of blood transfusions as patient is Jehovah's Witness   . Type II or unspecified type diabetes mellitus without mention of complication, not stated as uncontrolled     Past Surgical History:  Procedure Laterality  Date  . CARDIAC CATHETERIZATION  2004   patent coronary arteries  . CARDIAC CATHETERIZATION  05/01/2015   "tried to put stent in but couldn't"  . CARDIAC CATHETERIZATION N/A 05/01/2015   Procedure: Left Heart Cath and Coronary Angiography;  Surgeon: Belva Crome, MD; LAD 60%, oD1 90%, pD1 70%, D2 70%, CFX patent stent, 30% distal to prev stent, pRCA 20%, OM1 90/95%, NL LV  . CARDIAC CATHETERIZATION N/A 05/01/2015   Procedure: Coronary Stent Intervention;  Surgeon: Belva Crome, MD;  Unsuccessful PCI OM due to tortuosity  . CARDIAC CATHETERIZATION N/A 11/06/2014   Procedure: Left Heart Cath and Coronary Angiography;  Surgeon: Belva Crome, MD;  Location: [facility];  Service: Cardiovascular;  Laterality: N/A;  . CLOSED REDUCTION SHOULDER DISLOCATION Right ~ 1975   "& reattached muscle"  . COLONOSCOPY W/ BIOPSIES AND POLYPECTOMY  X 2  . ESOPHAGOGASTRODUODENOSCOPY ENDOSCOPY    . PILONIDAL CYST EXCISION      Current Outpatient Medications  Medication Sig Dispense Refill  . Blood Glucose Monitoring Suppl (RELION PREMIER BLU MONITOR) DEVI 1 Device by Does not apply route daily. 1 Device 0  . esomeprazole (NEXIUM) 40 MG capsule Take 40 mg by mouth daily as needed (heartburn or acid reflux).    . fluticasone (FLONASE) 50 MCG/ACT nasal spray Place 1 spray into both nostrils daily as needed.    Marland Kitchen glipiZIDE (GLUCOTROL XL) 5 MG  24 hr tablet TAKE 1 TABLET BY MOUTH TWICE A DAY 180 tablet 0  . glucose blood test strip Use as instructed to check sugar 2 times daily 200 each 5  . hydrALAZINE (APRESOLINE) 25 MG tablet TAKE 1 TABLET BY MOUTH TWICE A DAY 60 tablet 5  . Insulin Detemir (LEVEMIR FLEXTOUCH) 100 UNIT/ML Pen Inject 16 Units into the skin daily at 10 pm. 15 mL 11  . Insulin Pen Needle (NOVOFINE PLUS) 32G X 4 MM MISC Use 1x a day 100 each 11  . latanoprost (XALATAN) 0.005 % ophthalmic solution Place 1 drop into both eyes at bedtime.  5  . losartan-hydrochlorothiazide (HYZAAR) 100-25 MG  tablet Take 1 tablet by mouth daily. 90 tablet 3  . metFORMIN (GLUCOPHAGE) 500 MG tablet TAKE 2 TABLETS (1,000 MG TOTAL) BY MOUTH 2 (TWO) TIMES DAILY WITH A MEAL. 360 tablet 0  . metoprolol tartrate (LOPRESSOR) 25 MG tablet TAKE 1 TABLET(25 MG) BY MOUTH IN AM AND TAKE 1/2 TABLET(12.5 MG) BY MOUTH IN PM 135 tablet 1  . mirabegron ER (MYRBETRIQ) 25 MG TB24 tablet Take 25 mg by mouth daily.    . nitroGLYCERIN (NITROSTAT) 0.4 MG SL tablet PLACE ONE TABLET UNDER THE TONGUE EVERY 5 MINUTES X3 DOSES AS NEEDED FOR CHEST PAIN 75 tablet 1  . pravastatin (PRAVACHOL) 40 MG tablet Take 40 mg by mouth daily.  3  . traZODone (DESYREL) 50 MG tablet Take 1 tablet (50 mg total) by mouth at bedtime as needed for sleep. 30 tablet 3   No current facility-administered medications for this visit.     Allergies as of 11/23/2017 - Review Complete 11/23/2017  Allergen Reaction Noted  . Statins Other (See Comments)   . Imdur [isosorbide dinitrate]  02/22/2017    Family History  Problem Relation Age of Onset  . Coronary artery disease Other        CABG  . Coronary artery disease Mother   . Stroke Mother   . Heart disease Mother   . Diabetes Mother   . Other Father   . Diabetes Sister   . Diabetes Maternal Uncle        x2  . Alcohol abuse Other   . Diabetes Paternal Uncle        x2  . Colon cancer Paternal Uncle   . Colon polyps Neg Hx   . Esophageal cancer Neg Hx     Social History   Socioeconomic History  . Marital status: Married    Spouse name: Not on file  . Number of children: 4  . Years of education: Not on file  . Highest education level: Not on file  Occupational History  . Occupation: Retired    Fish farm manager: UNEMPLOYED  Social Needs  . Financial resource strain: Not on file  . Food insecurity:    Worry: Not on file    Inability: Not on file  . Transportation needs:    Medical: Not on file    Non-medical: Not on file  Tobacco Use  . Smoking status: Former Smoker    Packs/day: 0.50     Years: 10.00    Pack years: 5.00    Types: Cigarettes    Last attempt to quit: 04/06/1983    Years since quitting: 34.6  . Smokeless tobacco: Never Used  Substance and Sexual Activity  . Alcohol use: Yes    Alcohol/week: 0.0 standard drinks    Comment: `/26/2017 "might drink a beer q couple months mostly; summertime I might  drink 2-3 beers/week"  . Drug use: No  . Sexual activity: Not Currently  Lifestyle  . Physical activity:    Days per week: Not on file    Minutes per session: Not on file  . Stress: Not on file  Relationships  . Social connections:    Talks on phone: Not on file    Gets together: Not on file    Attends religious service: Not on file    Active member of club or organization: Not on file    Attends meetings of clubs or organizations: Not on file    Relationship status: Not on file  . Intimate partner violence:    Fear of current or ex partner: Not on file    Emotionally abused: Not on file    Physically abused: Not on file    Forced sexual activity: Not on file  Other Topics Concern  . Not on file  Social History Narrative   Lives in [place] with spouse Prentis Langdon).  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     Physical Exam: BP 134/80   Pulse 76   Ht 5\' 10"  (1.778 m)   Wt 253 lb 8 oz (115 kg)   BMI 36.37 kg/m  Constitutional: generally well-appearing Psychiatric: alert and oriented x3 Eyes: extraocular movements intact Mouth: oral pharynx moist, no lesions Neck: supple no lymphadenopathy Cardiovascular: heart regular rate and rhythm Lungs: clear to auscultation bilaterally Abdomen: soft, nontender, nondistended, no obvious ascites, no peritoneal signs, normal bowel sounds Extremities: no lower extremity edema bilaterally Skin: no lesions on visible extremities   Assessment and plan: 67 y.o. male with Hemoccult positive stool  He is being over screened for colon cancer.    He had a  colonoscopy about 3-1/2 years ago and it was normal.  He did not require colon cancer screening for 10 years from then.  We discussed colon cancer screening guidelines and recommendations.  Since he has microscopic blood in his stool I recommended that he have a repeat colonoscopy now.  He declined.  Instead he wants to recheck for microscopic blood in his stool and so I will give Brock 2 sets of home Hemoccult tests.  If any are positive I will again recommend he have a colonoscopy.  If none of them are positive then I explained to Brock that he does not need colon cancer screening again until 2026 as I had previously recommended after his normal colonoscopy 3 years ago.   Please see the "Patient Instructions" section for addition details about the plan.   Ronald Loffler, MD [facility] 11/23/2017, 3:34 PM  Cc: [name], MD

## 2017-11-23 NOTE — Patient Instructions (Addendum)
2 sets of FOBT home stool testing.  Normal BMI (Body Mass Index- based on height and weight) is between 23 and 30. Your BMI today is Body mass index is 36.37 kg/m. Marland Kitchen Please consider follow up  regarding your BMI with your Primary Care Provider.

## 2017-11-28 ENCOUNTER — Ambulatory Visit: Payer: Federal, State, Local not specified - PPO | Admitting: Cardiology

## 2017-11-28 NOTE — Progress Notes (Deleted)
Cardiology Office Note:    Date:  11/28/2017   ID:  [name], [name] 1950-05-24, MRN [id number]  PCP:  Ronald Pao, MD  Cardiologist:  Ronald Grooms, MD  Referring MD: Ronald Pao, MD   No chief complaint on file. ***  History of Present Illness:    [name] is a 67 y.o. male with a past medical history significant for CAD s/p DES to LCx 11/2014, OM1 not stented due to inability of stent to track (likely due to wire tangle), HTN, HLD, OSA not on CPAP and PAT/PAF (on anticoagulation due to low burden).   He was last seen in the office 02/22/2017 by Ronald Merle, NP.  It was noted that he had stopped his Imdur due to headache.  He has been noted in the past to have uncontrolled hypertension due to medication noncompliance and seen in our hypertension clinic.  He also seemed to have multiple nonspecific complaints including chest pain.  He is here today  Past Medical History:  Diagnosis Date  . Adenomatous colon polyp 2011  . Anemia   . Angina pectoris ([place]) 05/01/2015   med rx for 95% OM (u/a to access due to tortuosity), 95% D1 and other moderate CAD at cath  . Arthritis    "back, knees" (05/01/2015)  . Chest pain    pleuritic  . Chronic lower back pain   . Coronary artery disease   . Depression   . GERD (gastroesophageal reflux disease)   . HTN (hypertension)   . Hyperlipidemia   . Obstructive sleep apnea    noncompliant with CPAP  . Osteoarthritis   . Pneumonia ~ 2014 X 1  . Refusal of blood transfusions as patient is Jehovah's Witness   . Type II or unspecified type diabetes mellitus without mention of complication, not stated as uncontrolled     Past Surgical History:  Procedure Laterality Date  . CARDIAC CATHETERIZATION  2004   patent coronary arteries  . CARDIAC CATHETERIZATION  05/01/2015   "tried to put stent in but couldn't"  . CARDIAC CATHETERIZATION N/A 05/01/2015   Procedure: Left Heart Cath and Coronary Angiography;  Surgeon:  Ronald Crome, MD; LAD 60%, oD1 90%, pD1 70%, D2 70%, CFX patent stent, 30% distal to prev stent, pRCA 20%, OM1 90/95%, NL LV  . CARDIAC CATHETERIZATION N/A 05/01/2015   Procedure: Coronary Stent Intervention;  Surgeon: Ronald Crome, MD;  Unsuccessful PCI OM due to tortuosity  . CARDIAC CATHETERIZATION N/A 11/06/2014   Procedure: Left Heart Cath and Coronary Angiography;  Surgeon: Ronald Crome, MD;  Location: [facility];  Service: Cardiovascular;  Laterality: N/A;  . CLOSED REDUCTION SHOULDER DISLOCATION Right ~ 1975   "& reattached muscle"  . COLONOSCOPY W/ BIOPSIES AND POLYPECTOMY  X 2  . ESOPHAGOGASTRODUODENOSCOPY ENDOSCOPY    . PILONIDAL CYST EXCISION      Current Medications: No outpatient medications have been marked as taking for the 11/28/17 encounter (Appointment) with Ronald Perch, NP.     Allergies:   Statins and Imdur [isosorbide dinitrate]   Social History   Socioeconomic History  . Marital status: Married    Spouse name: Not on file  . Number of children: 4  . Years of education: Not on file  . Highest education level: Not on file  Occupational History  . Occupation: Retired    Fish farm manager: UNEMPLOYED  Social Needs  . Financial resource strain: Not on file  . Food insecurity:  Worry: Not on file    Inability: Not on file  . Transportation needs:    Medical: Not on file    Non-medical: Not on file  Tobacco Use  . Smoking status: Former Smoker    Packs/day: 0.50    Years: 10.00    Pack years: 5.00    Types: Cigarettes    Last attempt to quit: 04/06/1983    Years since quitting: 34.6  . Smokeless tobacco: Never Used  Substance and Sexual Activity  . Alcohol use: Yes    Alcohol/week: 0.0 standard drinks    Comment: `/26/2017 "might drink a beer q couple months mostly; summertime I might drink 2-3 beers/week"  . Drug use: No  . Sexual activity: Not Currently  Lifestyle  . Physical activity:    Days per week: Not on file    Minutes per session: Not  on file  . Stress: Not on file  Relationships  . Social connections:    Talks on phone: Not on file    Gets together: Not on file    Attends religious service: Not on file    Active member of club or organization: Not on file    Attends meetings of clubs or organizations: Not on file    Relationship status: Not on file  Other Topics Concern  . Not on file  Social History Narrative   Lives in [place] with spouse [name]).  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     Family History: The patient's ***family history includes Alcohol abuse in his other; Colon cancer in his paternal uncle; Coronary artery disease in his mother and other; Diabetes in his maternal uncle, mother, paternal uncle, and sister; Heart disease in his mother; Other in his father; Stroke in his mother. There is no history of Colon polyps or Esophageal cancer. ROS:   Please see the history of present illness.    *** All other systems reviewed and are negative.  EKGs/Labs/Other Studies Reviewed:    The following studies were reviewed today:  Echo 03/08/2016 Study Conclusions - Left ventricle: The cavity size was normal. Wall thickness was   increased in a pattern of mild LVH. Systolic function was normal.   The estimated ejection fraction was in the range of 55% to 60%.   Wall motion was normal; there were no regional wall motion   abnormalities. Doppler parameters are consistent with abnormal   left ventricular relaxation (grade 1 diastolic dysfunction). - Aorta: Aortic root dimension: 41 mm (ED). - Ascending aorta: The ascending aorta was mildly dilated. - Mitral valve: Moderately calcified annulus. There was systolic   anterior motion of the chordal structures. - Left atrium: The atrium was moderately dilated.  Impressions: - Compared to the prior study, there has been no significant   interval change.  Coronary Stent Intervention  Left Heart  Cath and Coronary Angiography  05/01/15  Conclusion  1. 1st Diag lesion, 70% stenosed. 2. Ost 1st Diag to 1st Diag lesion, 70% stenosed. 3. 2nd Diag lesion, 75% stenosed. 4. Mid LAD to Dist LAD lesion, 60% stenosed. 5. Prox RCA to Dist RCA lesion, 25% stenosed. 6. Dist Cx lesion, 30% stenosed. 7. Ost 1st Diag lesion, 90% stenosed. 8. 1st Mrg-1 lesion, 95% stenosed. 9. 1st Mrg-2 lesion, 95% stenosed. Post intervention, there is a 95% residual stenosis. The lesion was previously treated with angioplasty.  Continued widely patent mid circumflex stent.  95% stenosis in the  mid body of the first obtuse marginal, representing restenosis after angioplasty in August reduced the lesion to 50%.  90% ostial first diagonal  Otherwise no change in anatomy compared to the prior post PCI angiogram  Normal LV function  Failed first obtuse marginal PTCA due to inability to cross the stenosis. Angulation in the vessel prevented torque ability and we will unable to solve lack of intra-coronary support.  Recommendation:  Continue medical therapy  Monitor overnight and discharge in a.m.  No change in medical regimen  Resume phase II cardiac rehabilitation     EKG:  EKG is *** ordered today.  The ekg ordered today demonstrates ***  Recent Labs: 05/18/2017: BUN 8; Creatinine, Ser 0.88; Hemoglobin 13.8; Platelets 216; Potassium 3.9; Sodium 138   Recent Lipid Panel    Component Value Date/Time   CHOL 164 06/19/2015 1624   TRIG 61.0 06/19/2015 1624   HDL 38.40 (L) 06/19/2015 1624   CHOLHDL 4 06/19/2015 1624   VLDL 12.2 06/19/2015 1624   LDLCALC 114 (H) 06/19/2015 1624   LDLDIRECT 168.0 12/18/2012 1340    Physical Exam:    VS:  There were no vitals taken for this visit.    Wt Readings from Last 3 Encounters:  11/23/17 253 lb 8 oz (115 kg)  11/15/17 247 lb (112 kg)  10/04/17 250 lb 3.2 oz (113.5 kg)     Physical Exam***   ASSESSMENT:    1. Chest pain, unspecified type   2.  Essential hypertension   3. Hyperlipidemia LDL goal <70   4. Obstructive sleep apnea    PLAN:    In order of problems listed above:  Chest pain  Hypertension  CAD: LCx DES on aspirin alone.  Did not tolerate Imdur in the past due to headache  Hyperlipidemia: On Pravachol  History of PAF/PAT, not anticoagulated due to brief episode.  Was seen by Dr. Rayann Heman.  OSA: CPAP?     Medication Adjustments/Labs and Tests Ordered: Current medicines are reviewed at length with the patient today.  Concerns regarding medicines are outlined above. Labs and tests ordered and medication changes are outlined in the patient instructions below:  There are no Patient Instructions on file for this visit.   Signed, Ronald Perch, NP  11/28/2017 5:23 AM    [facility]

## 2017-11-29 ENCOUNTER — Other Ambulatory Visit: Payer: Self-pay | Admitting: Internal Medicine

## 2017-11-29 ENCOUNTER — Ambulatory Visit (INDEPENDENT_AMBULATORY_CARE_PROVIDER_SITE_OTHER): Payer: PPO | Admitting: Urology

## 2017-11-29 ENCOUNTER — Encounter: Payer: Self-pay | Admitting: Urology

## 2017-11-29 VITALS — BP 169/93 | HR 80

## 2017-11-29 DIAGNOSIS — N5203 Combined arterial insufficiency and corporo-venous occlusive erectile dysfunction: Secondary | ICD-10-CM

## 2017-11-29 DIAGNOSIS — C61 Malignant neoplasm of prostate: Secondary | ICD-10-CM | POA: Diagnosis not present

## 2017-11-29 DIAGNOSIS — R350 Frequency of micturition: Secondary | ICD-10-CM

## 2017-11-29 NOTE — Progress Notes (Signed)
11/29/2017 2:40 PM   TAGG EUSTICE Apr 02, 1951 [id number]  Referring provider: [name], MD [address] [place], [place] [address]  Chief Complaint  Patient presents with  . Prostate Cancer    Biopsy Results    HPI: 67 year old male who presents today following prostate biopsy for newly diagnosed prostate cancer.  He was referred back for further evaluation of rising PSA.  His most recent PSA was 5.8 on 08/27/2017.  His PSA is risen steadily since 2011.  He does have a uncle with prostate cancer.  Prostate biopsy was performed on 11/15/2017.  This revealed 6 of 12 cores positive for malignancy, 3 on the left and 3 on the right.  This is primarily 3 posterior but there was a single core Gleason 3+4 involving 46% of the tissue at the left lateral mid base.  TRUS vol 31.7 cc.  Rectal exam is concerning for possible asymmetry at the left base correlating with his high risk cancer.  No previous abdominal surgeries.  BM 36.4  He does have baseline urinary urgency/ frequency.   He is no longer taking myrbetriq.  No obstructive symptoms.    He does have mild baseline ED.  SHIM as below.    IPSS    Row Name 11/29/17 1100         International Prostate Symptom Score   How often have you had the sensation of not emptying your bladder?  Not at All     How often have you had to urinate less than every two hours?  Not at All     How often have you found you stopped and started again several times when you urinated?  Not at All     How often have you found it difficult to postpone urination?  More than half the time     How often have you had a weak urinary stream?  Not at All     How often have you had to strain to start urination?  Not at All     How many times did you typically get up at night to urinate?  2 Times     Total IPSS Score  6       Quality of Life due to urinary symptoms   If you were to spend the rest of your life with your urinary condition just the way  it is now how would you feel about that?  Unhappy        Score:  1-7 Mild 8-19 Moderate 20-35 Severe  SHIM    Row Name 11/29/17 1145         SHIM: Over the last 6 months:   How do you rate your confidence that you could get and keep an erection?  Moderate     When you had erections with sexual stimulation, how often were your erections hard enough for penetration (entering your partner)?  Sometimes (about half the time)     During sexual intercourse, how often were you able to maintain your erection after you had penetrated (entered) your partner?  Sometimes (about half the time)     During sexual intercourse, how difficult was it to maintain your erection to completion of intercourse?  Slightly Difficult     When you attempted sexual intercourse, how often was it satisfactory for you?  Most Times (much more than half the time)       SHIM Total Score   SHIM  17  PMH: Past Medical History:  Diagnosis Date  . Adenomatous colon polyp 2011  . Anemia   . Angina pectoris ([place]) 05/01/2015   med rx for 95% OM (u/a to access due to tortuosity), 95% D1 and other moderate CAD at cath  . Arthritis    "back, knees" (05/01/2015)  . Chest pain    pleuritic  . Chronic lower back pain   . Coronary artery disease   . Depression   . GERD (gastroesophageal reflux disease)   . HTN (hypertension)   . Hyperlipidemia   . Obstructive sleep apnea    noncompliant with CPAP  . Osteoarthritis   . Pneumonia ~ 2014 X 1  . Refusal of blood transfusions as patient is Jehovah's Witness   . Type II or unspecified type diabetes mellitus without mention of complication, not stated as uncontrolled     Surgical History: Past Surgical History:  Procedure Laterality Date  . CARDIAC CATHETERIZATION  2004   patent coronary arteries  . CARDIAC CATHETERIZATION  05/01/2015   "tried to put stent in but couldn't"  . CARDIAC CATHETERIZATION N/A 05/01/2015   Procedure: Left Heart Cath and Coronary  Angiography;  Surgeon: Belva Crome, MD; LAD 60%, oD1 90%, pD1 70%, D2 70%, CFX patent stent, 30% distal to prev stent, pRCA 20%, OM1 90/95%, NL LV  . CARDIAC CATHETERIZATION N/A 05/01/2015   Procedure: Coronary Stent Intervention;  Surgeon: Belva Crome, MD;  Unsuccessful PCI OM due to tortuosity  . CARDIAC CATHETERIZATION N/A 11/06/2014   Procedure: Left Heart Cath and Coronary Angiography;  Surgeon: Belva Crome, MD;  Location: [place] [facility];  Service: Cardiovascular;  Laterality: N/A;  . CLOSED REDUCTION SHOULDER DISLOCATION Right ~ 1975   "& reattached muscle"  . COLONOSCOPY W/ BIOPSIES AND POLYPECTOMY  X 2  . ESOPHAGOGASTRODUODENOSCOPY ENDOSCOPY    . PILONIDAL CYST EXCISION      Home Medications:  Allergies as of 11/29/2017      Reactions   Statins Other (See Comments)   REACTION: joint pain Lipitor- headaches Has also tried Livalo, pravachol, zetia, crestor, welchol   Imdur [isosorbide Dinitrate]    Headache - "violent"      Medication List        Accurate as of 11/29/17 11:59 PM. Always use your most recent med list.          esomeprazole 40 MG capsule Commonly known as:  NEXIUM Take 40 mg by mouth daily as needed (heartburn or acid reflux).   fluticasone 50 MCG/ACT nasal spray Commonly known as:  FLONASE Place 1 spray into both nostrils daily as needed.   glipiZIDE 5 MG 24 hr tablet Commonly known as:  GLUCOTROL XL TAKE 1 TABLET BY MOUTH TWICE A DAY   glipiZIDE 5 MG 24 hr tablet Commonly known as:  GLUCOTROL XL TAKE 1 TABLET BY MOUTH TWICE A DAY   glucose blood test strip Use as instructed to check sugar 2 times daily   hydrALAZINE 25 MG tablet Commonly known as:  APRESOLINE TAKE 1 TABLET BY MOUTH TWICE A DAY   Insulin Detemir 100 UNIT/ML Pen Commonly known as:  LEVEMIR Inject 16 Units into the skin daily at 10 pm.   Insulin Pen Needle 32G X 4 MM Misc Use 1x a day   latanoprost 0.005 % ophthalmic solution Commonly known as:  XALATAN Place 1  drop into both eyes at bedtime.   losartan-hydrochlorothiazide 100-25 MG tablet Commonly known as:  HYZAAR Take 1 tablet by mouth  daily.   metFORMIN 500 MG tablet Commonly known as:  GLUCOPHAGE TAKE 2 TABLETS (1,000 MG TOTAL) BY MOUTH 2 (TWO) TIMES DAILY WITH A MEAL.   metoprolol tartrate 25 MG tablet Commonly known as:  LOPRESSOR TAKE 1 TABLET(25 MG) BY MOUTH IN AM AND TAKE 1/2 TABLET(12.5 MG) BY MOUTH IN PM   nitroGLYCERIN 0.4 MG SL tablet Commonly known as:  NITROSTAT PLACE ONE TABLET UNDER THE TONGUE EVERY 5 MINUTES X3 DOSES AS NEEDED FOR CHEST PAIN   pravastatin 40 MG tablet Commonly known as:  PRAVACHOL Take 40 mg by mouth daily.   RELION PREMIER BLU MONITOR Devi 1 Device by Does not apply route daily.   traZODone 50 MG tablet Commonly known as:  DESYREL Take 1 tablet (50 mg total) by mouth at bedtime as needed for sleep.       Allergies:  Allergies  Allergen Reactions  . Statins Other (See Comments)    REACTION: joint pain Lipitor- headaches Has also tried Livalo, pravachol, zetia, crestor, welchol  . Imdur [Isosorbide Dinitrate]     Headache - "violent"    Family History: Family History  Problem Relation Age of Onset  . Coronary artery disease Other        CABG  . Coronary artery disease Mother   . Stroke Mother   . Heart disease Mother   . Diabetes Mother   . Other Father   . Diabetes Sister   . Diabetes Maternal Uncle        x2  . Alcohol abuse Other   . Diabetes Paternal Uncle        x2  . Colon cancer Paternal Uncle   . Colon polyps Neg Hx   . Esophageal cancer Neg Hx     Social History:  reports that he quit smoking about 34 years ago. His smoking use included cigarettes. He has a 5.00 pack-year smoking history. He has never used smokeless tobacco. He reports that he drinks alcohol. He reports that he does not use drugs.  ROS: UROLOGY Frequent Urination?: Yes Hard to postpone urination?: Yes Burning/pain with urination?: No Get up at  night to urinate?: Yes Leakage of urine?: Yes Urine stream starts and stops?: No Trouble starting stream?: No Do you have to strain to urinate?: No Blood in urine?: No Urinary tract infection?: Yes Sexually transmitted disease?: No Injury to kidneys or bladder?: No Painful intercourse?: No Weak stream?: No Erection problems?: Yes Penile pain?: No  Gastrointestinal Nausea?: No Vomiting?: No Indigestion/heartburn?: No Diarrhea?: No Constipation?: No  Constitutional Fever: No Night sweats?: No Weight loss?: No Fatigue?: Yes  Skin Skin rash/lesions?: No Itching?: No  Eyes Blurred vision?: No Double vision?: No  Ears/Nose/Throat Sore throat?: No Sinus problems?: Yes  Hematologic/Lymphatic Swollen glands?: No Easy bruising?: No  Cardiovascular Leg swelling?: Yes Chest pain?: No  Respiratory Cough?: Yes Shortness of breath?: Yes  Endocrine Excessive thirst?: No  Musculoskeletal Back pain?: Yes Joint pain?: Yes  Neurological Headaches?: Yes Dizziness?: No  Psychologic Depression?: Yes Anxiety?: Yes  Physical Exam: BP (!) 169/93   Pulse 80   Constitutional:  Alert and oriented, No acute distress. HEENT: [place] AT, moist mucus membranes.  Trachea midline, no masses. Cardiovascular: No clubbing, cyanosis, or edema. Respiratory: Normal respiratory effort, no increased work of breathing. Skin: No rashes, bruises or suspicious lesions. Neurologic: Grossly intact, no focal deficits, moving all 4 extremities. Psychiatric: Normal mood and affect.  Laboratory Data: Lab Results  Component Value Date   WBC 6.5 05/18/2017  HGB 13.8 05/18/2017   HCT 42.4 05/18/2017   MCV 85.1 05/18/2017   PLT 216 05/18/2017    Lab Results  Component Value Date   CREATININE 0.88 05/18/2017    Lab Results  Component Value Date   PSA 3.53 04/19/2013   PSA 2.41 09/29/2011   PSA 1.51 10/23/2009    Lab Results  Component Value Date   TESTOSTERONE 408.15 05/20/2009     Lab Results  Component Value Date   HGBA1C 6.6 (A) 10/04/2017    Urinalysis N/a  Pertinent Imaging: N/a  Assessment & Plan:    1. Prostate cancer [facility]) 67 year old male with newly diagnosed intermediate risk prostate cancer, pT2a  The patient was counseled about the natural history of prostate cancer and the standard treatment options that are available for prostate cancer. It was explained to him how his age and life expectancy, clinical stage, Gleason score, and PSA affect his prognosis, the decision to proceed with additional staging studies, as well as how that information influences recommended treatment strategies. We discussed the roles for active surveillance, radiation therapy, surgical therapy, androgen deprivation, as well as ablative therapy options for the treatment of prostate cancer as appropriate to his individual cancer situation. We discussed the risks and benefits of these options with regard to their impact on cancer control and also in terms of potential adverse events, complications, and impact on quality of life particularly related to urinary, bowel, and sexual function. The patient was encouraged to ask questions throughout the discussion today and all questions were answered to his stated satisfaction. In addition, the patient was providedwith and/or directed to appropriate resources and literature for further education about prostate cancer treatment options.  We discussed surgical therapy for prostate cancer including the different available surgical approaches. We discussed, in detail, the risks and expectations of surgery with regard to cancer control, urinary control, and erectile dysfunction as well as expected post operative cover he processed. Additional risks of surgery including but not limited to bleeding, infection, hernia formation, nerve damage, fistula formation, bowel/rectal injury, potentially necessitating colostomy, damage to the urinary tract  resulting in urinary leakage, urethral stricture, and cardiopulmonary risk such as myocardial infarction, stroke, death, thromboembolism etc. were explained. The risk of open surgical conversion for robotics/laparoscopic prostatectomy is also discussed.  He was offered referral to radiation oncology for further discussion of his radiation options.  We did briefly discuss that if he elects EBRT, 6 months of Lupron would be indicated.  1 cc radiation oncology, he will let us know how he like to proceed.  - Ambulatory referral to Radiation Oncology  2. Combined arterial insufficiency and corporo-venous occlusive erectile dysfunction Mild baseline ED PD 85 is contraindicated given his history of nitroglycerin  3. Urinary frequency Previously on Myrbetriq, not interested in pursuing further treatment at this time   [name], [place] [address] [place], [place] [address] [phone]  I spent 40 min with this patient of which greater than 50% was spent in counseling and coordination of care with the patient.

## 2017-12-02 ENCOUNTER — Other Ambulatory Visit: Payer: Federal, State, Local not specified - PPO

## 2017-12-02 DIAGNOSIS — K921 Melena: Secondary | ICD-10-CM

## 2017-12-02 LAB — HEMOCCULT SLIDES (X 3 CARDS)
FECAL OCCULT BLD: NEGATIVE
OCCULT 1: NEGATIVE
OCCULT 2: NEGATIVE
OCCULT 3: NEGATIVE
OCCULT 4: NEGATIVE
OCCULT 5: NEGATIVE

## 2017-12-07 ENCOUNTER — Ambulatory Visit: Payer: Federal, State, Local not specified - PPO | Admitting: Radiation Oncology

## 2017-12-13 DIAGNOSIS — C61 Malignant neoplasm of prostate: Secondary | ICD-10-CM | POA: Diagnosis not present

## 2017-12-14 ENCOUNTER — Encounter: Payer: Self-pay | Admitting: Radiation Oncology

## 2017-12-16 ENCOUNTER — Encounter: Payer: Self-pay | Admitting: Radiation Oncology

## 2017-12-27 ENCOUNTER — Ambulatory Visit (INDEPENDENT_AMBULATORY_CARE_PROVIDER_SITE_OTHER): Payer: PPO | Admitting: Cardiology

## 2017-12-27 ENCOUNTER — Encounter: Payer: Self-pay | Admitting: Cardiology

## 2017-12-27 VITALS — BP 142/72 | HR 81 | Ht 70.0 in | Wt 251.1 lb

## 2017-12-27 DIAGNOSIS — I25709 Atherosclerosis of coronary artery bypass graft(s), unspecified, with unspecified angina pectoris: Secondary | ICD-10-CM | POA: Diagnosis not present

## 2017-12-27 DIAGNOSIS — I10 Essential (primary) hypertension: Secondary | ICD-10-CM | POA: Diagnosis not present

## 2017-12-27 DIAGNOSIS — E785 Hyperlipidemia, unspecified: Secondary | ICD-10-CM

## 2017-12-27 DIAGNOSIS — Z9989 Dependence on other enabling machines and devices: Secondary | ICD-10-CM

## 2017-12-27 DIAGNOSIS — R079 Chest pain, unspecified: Secondary | ICD-10-CM

## 2017-12-27 DIAGNOSIS — E669 Obesity, unspecified: Secondary | ICD-10-CM | POA: Diagnosis not present

## 2017-12-27 DIAGNOSIS — G4733 Obstructive sleep apnea (adult) (pediatric): Secondary | ICD-10-CM

## 2017-12-27 NOTE — Progress Notes (Signed)
Cardiology Office Note:    Date:  12/27/2017   ID:  Ronald, Brock [birth year], MRN [id number]  PCP:  [name], MD           Cardiologist:  [name], MD  Referring MD: [name], MD   CC: follow up of CAD and chest pain   History of Present Illness:    [name] is a 67 y.o. male with a past medical history significant for CAD s/p DES to LCx 11/2014, OM1 not stented due to inability of stent to track (likely due to wire tangle), HTN, HLD, OSA not on CPAP and PAT/PAF (on anticoagulation due to low burden). Cath in 04/2015 showed widely patent circumflex stent, 95% stenosis of the first OM with failed PTCA due to inability to cross the stenosis.  He was continued on medical therapy.  He was last seen in the office 02/22/2017 by Truitt Merle, NP.  It was noted that he had stopped his Imdur due to headache.  He has been noted in the past to have uncontrolled hypertension due to medication noncompliance and seen in our hypertension clinic.  He also seemed to have multiple nonspecific complaints including chest pain.  He has been diagnosed with prostate cancer and is being followed at [facility] and is in discussions regarding surgery and radiation therapy. He was also found to have microscopic blood in his stool in August and was referred to GI who recommended a colonoscopy (he had normal colonoscopy 3.5 years prior), however the patient declined.  He opted to repeat the Hemoccult tests.  He is here today for follow up of complaints of chest heaviness and a phone call to our office on 10/07/2017. He was having heaviness in his shoulders and upper back similar to his prior to his stent. He was advised to go to the ED or come into the office, which he did not do. He has had no further heaviness since that day. He does some house work and some yard work with no exertional chest pain or shortness of breath.  Does not exercise.  He occ gets heart burn  after he eats. He is complaining of leg pain, sore to the touch on medial legs. He is using an ointment and tylenol which is helping a little. He has been intolerant to statins in the past.   Says that he is not planning any upcoming surgery for his prostate cancer, he is planning to initiate radiation therapy.      Past Medical History:  Diagnosis Date  . Adenomatous colon polyp 2011  . Anemia   . Angina pectoris ([place]) 05/01/2015   med rx for 95% OM (u/a to access due to tortuosity), 95% D1 and other moderate CAD at cath  . Arthritis    "back, knees" (05/01/2015)  . Chest pain    pleuritic  . Chronic lower back pain   . Coronary artery disease   . Depression   . GERD (gastroesophageal reflux disease)   . HTN (hypertension)   . Hyperlipidemia   . Obstructive sleep apnea    noncompliant with CPAP  . Osteoarthritis   . Pneumonia ~ 2014 X 1  . Refusal of blood transfusions as patient is Jehovah's Witness   . Type II or unspecified type diabetes mellitus without mention of complication, not stated as uncontrolled          Past Surgical History:  Procedure Laterality Date  . CARDIAC CATHETERIZATION  2004   patent coronary arteries  . CARDIAC CATHETERIZATION  05/01/2015   "tried to put stent in but couldn't"  . CARDIAC CATHETERIZATION N/A 05/01/2015   Procedure: Left Heart Cath and Coronary Angiography;  Surgeon: Belva Crome, MD; LAD 60%, oD1 90%, pD1 70%, D2 70%, CFX patent stent, 30% distal to prev stent, pRCA 20%, OM1 90/95%, NL LV  . CARDIAC CATHETERIZATION N/A 05/01/2015   Procedure: Coronary Stent Intervention;  Surgeon: Belva Crome, MD;  Unsuccessful PCI OM due to tortuosity  . CARDIAC CATHETERIZATION N/A 11/06/2014   Procedure: Left Heart Cath and Coronary Angiography;  Surgeon: Belva Crome, MD;  Location: [place] [facility];  Service: Cardiovascular;  Laterality: N/A;  . CLOSED REDUCTION SHOULDER DISLOCATION Right ~ 1975   "& reattached  muscle"  . COLONOSCOPY W/ BIOPSIES AND POLYPECTOMY  X 2  . ESOPHAGOGASTRODUODENOSCOPY ENDOSCOPY    . PILONIDAL CYST EXCISION      Current Medications: No outpatient medications have been marked as taking for the 11/28/17 encounter (Appointment) with Daune Perch, NP.     Allergies:   Statins and Imdur [isosorbide dinitrate]   Social History        Socioeconomic History  . Marital status: Married    Spouse name: Not on file  . Number of children: 4  . Years of education: Not on file  . Highest education level: Not on file  Occupational History  . Occupation: Retired    Fish farm manager: UNEMPLOYED  Social Needs  . Financial resource strain: Not on file  . Food insecurity:    Worry: Not on file    Inability: Not on file  . Transportation needs:    Medical: Not on file    Non-medical: Not on file  Tobacco Use  . Smoking status: Former Smoker    Packs/day: 0.50    Years: 10.00    Pack years: 5.00    Types: Cigarettes    Last attempt to quit: 04/06/1983    Years since quitting: 34.6  . Smokeless tobacco: Never Used  Substance and Sexual Activity  . Alcohol use: Yes    Alcohol/week: 0.0 standard drinks    Comment: `/26/2017 "might drink a beer q couple months mostly; summertime I might drink 2-3 beers/week"  . Drug use: No  . Sexual activity: Not Currently  Lifestyle  . Physical activity:    Days per week: Not on file    Minutes per session: Not on file  . Stress: Not on file  Relationships  . Social connections:    Talks on phone: Not on file    Gets together: Not on file    Attends religious service: Not on file    Active member of club or organization: Not on file    Attends meetings of clubs or organizations: Not on file    Relationship status: Not on file  Other Topics Concern  . Not on file  Social History Narrative   Lives in [place] with spouse [name]).  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     Family History: The patient's family history includes Alcohol abuse in his other; Colon cancer in his paternal uncle; Coronary artery disease in his mother and other; Diabetes in his maternal uncle, mother, paternal uncle, and sister; Heart disease in his mother; Other in his father; Stroke in his mother. There is no history of Colon polyps or Esophageal cancer. ROS:  Please see the history of present illness.     All other systems reviewed and are negative.  EKGs/Labs/Other Studies Reviewed:    The following studies were reviewed today:  Echo 03/08/2016 Study Conclusions - Left ventricle: The cavity size was normal. Wall thickness was increased in a pattern of mild LVH. Systolic function was normal. The estimated ejection fraction was in the range of 55% to 60%. Wall motion was normal; there were no regional wall motion abnormalities. Doppler parameters are consistent with abnormal left ventricular relaxation (grade 1 diastolic dysfunction). - Aorta: Aortic root dimension: 41 mm (ED). - Ascending aorta: The ascending aorta was mildly dilated. - Mitral valve: Moderately calcified annulus. There was systolic anterior motion of the chordal structures. - Left atrium: The atrium was moderately dilated.  Impressions: - Compared to the prior study, there has been no significant interval change.  Coronary Stent Intervention  Left Heart Cath and Coronary Angiography  05/01/15  Conclusion  1. 1st Diag lesion, 70% stenosed. 2. Ost 1st Diag to 1st Diag lesion, 70% stenosed. 3. 2nd Diag lesion, 75% stenosed. 4. Mid LAD to Dist LAD lesion, 60% stenosed. 5. Prox RCA to Dist RCA lesion, 25% stenosed. 6. Dist Cx lesion, 30% stenosed. 7. Ost 1st Diag lesion, 90% stenosed. 8. 1st Mrg-1 lesion, 95% stenosed. 9. 1st Mrg-2 lesion, 95% stenosed. Post intervention, there is a 95% residual stenosis. The lesion was previously  treated with angioplasty.  Continued widely patent mid circumflex stent.  95% stenosis in the mid body of the first obtuse marginal, representing restenosis after angioplasty in August reduced the lesion to 50%.  90% ostial first diagonal  Otherwise no change in anatomy compared to the prior post PCI angiogram  Normal LV function  Failed first obtuse marginal PTCA due to inability to cross the stenosis. Angulation in the vessel prevented torque ability and we will unable to solve lack of intra-coronary support.  Recommendation:  Continue medical therapy  Monitor overnight and discharge in a.m.  No change in medical regimen  Resume phase II cardiac rehabilitation     EKG:  EKG is ordered today.  The ekg ordered today demonstrates sinus rhythm with occasional PVCs with no acute ischemic changes  Recent Labs: 05/18/2017: BUN 8; Creatinine, Ser 0.88; Hemoglobin 13.8; Platelets 216; Potassium 3.9; Sodium 138   Recent Lipid Panel         Component Value Date/Time   CHOL 164 06/19/2015 1624   TRIG 61.0 06/19/2015 1624   HDL 38.40 (L) 06/19/2015 1624   CHOLHDL 4 06/19/2015 1624   VLDL 12.2 06/19/2015 1624   LDLCALC 114 (H) 06/19/2015 1624   LDLDIRECT 168.0 12/18/2012 1340    Physical Exam:    VS: BP (!) 142/72   Pulse 81   Ht 5\' 10"  (1.778 m)   Wt 251 lb 1.9 oz (113.9 kg)   BMI 36.03 kg/m       Wt Readings from Last 3 Encounters:  11/23/17 253 lb 8 oz (115 kg)  11/15/17 247 lb (112 kg)  10/04/17 250 lb 3.2 oz (113.5 kg)     Physical Exam  Constitutional: He is oriented to person, place, and time. He appears well-developed and well-nourished. No distress.  HENT:  Head: Normocephalic and atraumatic.  Neck: Normal range of motion. Neck supple. No JVD present.  Cardiovascular: Normal rate, regular rhythm, normal heart sounds and intact distal pulses. Exam reveals no gallop and no friction rub.  No murmur heard. Pulmonary/Chest: Effort normal  and breath sounds  normal. No respiratory distress. He has no wheezes. He has no rales.  Abdominal: Soft. Bowel sounds are normal.  Musculoskeletal: Normal range of motion. He exhibits no edema or deformity.  Neurological: He is alert and oriented to person, place, and time.  Skin: Skin is warm and dry.  Psychiatric: He has a normal mood and affect. His behavior is normal. Judgment and thought content normal.  Vitals reviewed.   ASSESSMENT:    1. Chest pain, unspecified type   2. Essential hypertension   3. Hyperlipidemia LDL goal <70   4. Obstructive sleep apnea    PLAN:    In order of problems listed above:  Chest pain: had one episode of heaviness upper back shoulders on 7/5 that concerned him as being similar to what he experienced prior to his stents, however he did not come in for evaluation as advised.  He has had no such discomfort since that time.  Recent advised on symptoms to report and when to seek urgent medical treatment.  Hypertension: Blood pressure is under fair control.  Continue current therapy  CAD: Hx of LCx DES, 1st OM disease with failure PTCA due to inability to cross the stenosis in 04/2015.  On aspirin alone.  Did not tolerate Imdur in the past due to headache.  We discussed heart healthy diet and initiating exercise with a few minutes of walking per day and building up to 30 minutes/day on most days of the week.  Hyperlipidemia: On Pravachol.  He has had problems tolerating statins in the past.  He is having leg pain that I am suspicious is related to the pravachol. His LDL is still 98 in 08/2017. Not to goal of <70.  I will refer to lipid clinic for consideration of possible PCSK9 inhibitor therapy.   History of PAF/PAT, not anticoagulated due to brief episode.  Was seen by Dr. Rayann Heman. No palpitations.   OSA: He did not tolerate CPAP. We discussed benefits and he agrees to try to start using.   Obesity: Body mass index is 36.03 kg/m.  We discussed  heart healthy diet and exercise.    Medication Adjustments/Labs and Tests Ordered: Current medicines are reviewed at length with the patient today.  Concerns regarding medicines are outlined above. Labs and tests ordered and medication changes are outlined in the patient instructions below:  There are no Patient Instructions on file for this visit.   Signed, Daune Perch, NP  11/28/2017 5:23 AM    [facility]

## 2017-12-27 NOTE — Patient Instructions (Addendum)
Medication Instructions:  Your physician recommends that you continue on your current medications as directed. Please refer to the Current Medication list given to you today.   Labwork: None ordered  Testing/Procedures: None ordered  You have been referred to [place].   Follow-Up: Your physician recommends that you schedule a follow-up appointment in: [place]   Any Other Special Instructions Will Be Listed Below (If Applicable).  DASH Eating Plan DASH stands for "Dietary Approaches to Stop Hypertension." The DASH eating plan is a healthy eating plan that has been shown to reduce high blood pressure (hypertension). It may also reduce your risk for type 2 diabetes, heart disease, and stroke. The DASH eating plan may also help with weight loss. What are tips for following this plan? General guidelines  Avoid eating more than 2,300 mg (milligrams) of salt (sodium) a day. If you have hypertension, you may need to reduce your sodium intake to 1,500 mg a day.  Limit alcohol intake to no more than 1 drink a day for nonpregnant women and 2 drinks a day for men. One drink equals 12 oz of beer, 5 oz of wine, or 1 oz of hard liquor.  Work with your health care provider to maintain a healthy body weight or to lose weight. Ask what an ideal weight is for you.  Get at least 30 minutes of exercise that causes your heart to beat faster (aerobic exercise) most days of the week. Activities may include walking, swimming, or biking.  Work with your health care provider or diet and nutrition specialist (dietitian) to adjust your eating plan to your individual calorie needs. Reading food labels  Check food labels for the amount of sodium per serving. Choose foods with less than 5 percent of the Daily Value of sodium. Generally, foods with less than 300 mg of sodium per serving fit into this eating plan.  To find whole grains, look for the word "whole" as the first word in the  ingredient list. Shopping  Buy products labeled as "low-sodium" or "no salt added."  Buy fresh foods. Avoid canned foods and premade or frozen meals. Cooking  Avoid adding salt when cooking. Use salt-free seasonings or herbs instead of table salt or sea salt. Check with your health care provider or pharmacist before using salt substitutes.  Do not fry foods. Cook foods using healthy methods such as baking, boiling, grilling, and broiling instead.  Cook with heart-healthy oils, such as olive, canola, soybean, or sunflower oil. Meal planning   Eat a balanced diet that includes: ? 5 or more servings of fruits and vegetables each day. At each meal, try to fill half of your plate with fruits and vegetables. ? Up to 6-8 servings of whole grains each day. ? Less than 6 oz of lean meat, poultry, or fish each day. A 3-oz serving of meat is about the same size as a deck of cards. One egg equals 1 oz. ? 2 servings of low-fat dairy each day. ? A serving of nuts, seeds, or beans 5 times each week. ? Heart-healthy fats. Healthy fats called Omega-3 fatty acids are found in foods such as flaxseeds and coldwater fish, like sardines, salmon, and mackerel.  Limit how much you eat of the following: ? Canned or prepackaged foods. ? Food that is high in trans fat, such as fried foods. ? Food that is high in saturated fat, such as fatty meat. ? Sweets, desserts, sugary drinks, and other foods with  added sugar. ? Full-fat dairy products.  Do not salt foods before eating.  Try to eat at least 2 vegetarian meals each week.  Eat more home-cooked food and less restaurant, buffet, and fast food.  When eating at a restaurant, ask that your food be prepared with less salt or no salt, if possible. What foods are recommended? The items listed may not be a complete list. Talk with your dietitian about what dietary choices are best for you. Grains Whole-grain or whole-wheat bread. Whole-grain or whole-wheat  pasta. Brown rice. [place]. Bulgur. Whole-grain and low-sodium cereals. Pita bread. Low-fat, low-sodium crackers. Whole-wheat flour tortillas. Vegetables Fresh or frozen vegetables (raw, steamed, roasted, or grilled). Low-sodium or reduced-sodium tomato and vegetable juice. Low-sodium or reduced-sodium tomato sauce and tomato paste. Low-sodium or reduced-sodium canned vegetables. Fruits All fresh, dried, or frozen fruit. Canned fruit in natural juice (without added sugar). Meat and other protein foods Skinless chicken or [place]. Ground chicken or [place]. Pork with fat trimmed off. Fish and seafood. Egg whites. Dried beans, peas, or lentils. Unsalted nuts, nut butters, and seeds. Unsalted canned beans. Lean cuts of beef with fat trimmed off. Low-sodium, lean deli meat. Dairy Low-fat (1%) or fat-free (skim) milk. Fat-free, low-fat, or reduced-fat cheeses. Nonfat, low-sodium ricotta or cottage cheese. Low-fat or nonfat yogurt. Low-fat, low-sodium cheese. Fats and oils Soft margarine without trans fats. Vegetable oil. Low-fat, reduced-fat, or light mayonnaise and salad dressings (reduced-sodium). Canola, safflower, olive, soybean, and sunflower oils. Avocado. Seasoning and other foods Herbs. Spices. Seasoning mixes without salt. Unsalted popcorn and pretzels. Fat-free sweets. What foods are not recommended? The items listed may not be a complete list. Talk with your dietitian about what dietary choices are best for you. Grains Baked goods made with fat, such as croissants, muffins, or some breads. Dry pasta or rice meal packs. Vegetables Creamed or fried vegetables. Vegetables in a cheese sauce. Regular canned vegetables (not low-sodium or reduced-sodium). Regular canned tomato sauce and paste (not low-sodium or reduced-sodium). Regular tomato and vegetable juice (not low-sodium or reduced-sodium). Angie Fava. Olives. Fruits Canned fruit in a light or heavy syrup. Fried fruit. Fruit in cream or  butter sauce. Meat and other protein foods Fatty cuts of meat. Ribs. Fried meat. Berniece Salines. Sausage. Bologna and other processed lunch meats. Salami. Fatback. Hotdogs. Bratwurst. Salted nuts and seeds. Canned beans with added salt. Canned or smoked fish. Whole eggs or egg yolks. Chicken or [place] with skin. Dairy Whole or 2% milk, cream, and half-and-half. Whole or full-fat cream cheese. Whole-fat or sweetened yogurt. Full-fat cheese. Nondairy creamers. Whipped toppings. Processed cheese and cheese spreads. Fats and oils Butter. Stick margarine. Lard. Shortening. Ghee. Bacon fat. Tropical oils, such as coconut, palm kernel, or palm oil. Seasoning and other foods Salted popcorn and pretzels. Onion salt, garlic salt, seasoned salt, table salt, and sea salt. Worcestershire sauce. Tartar sauce. Barbecue sauce. Teriyaki sauce. Soy sauce, including reduced-sodium. Steak sauce. Canned and packaged gravies. Fish sauce. Oyster sauce. Cocktail sauce. Horseradish that you find on the shelf. Ketchup. Mustard. Meat flavorings and tenderizers. Bouillon cubes. Hot sauce and Tabasco sauce. Premade or packaged marinades. Premade or packaged taco seasonings. Relishes. Regular salad dressings. Where to find more information:  National Heart, Lung, and [place]: https://wilson-eaton.com/  American Heart Association: www.heart.org Summary  The DASH eating plan is a healthy eating plan that has been shown to reduce high blood pressure (hypertension). It may also reduce your risk for type 2 diabetes, heart disease, and stroke.  With the DASH  eating plan, you should limit salt (sodium) intake to 2,300 mg a day. If you have hypertension, you may need to reduce your sodium intake to 1,500 mg a day.  When on the DASH eating plan, aim to eat more fresh fruits and vegetables, whole grains, lean proteins, low-fat dairy, and heart-healthy fats.  Work with your health care provider or diet and nutrition specialist (dietitian) to  adjust your eating plan to your individual calorie needs. This information is not intended to replace advice given to you by your health care provider. Make sure you discuss any questions you have with your health care provider. Document Released: 03/11/2011 Document Revised: 03/15/2016 Document Reviewed: 03/15/2016 Elsevier Interactive Patient Education  [employer].   If you need a refill on your cardiac medications before your next appointment, please call your pharmacy.

## 2017-12-29 ENCOUNTER — Telehealth: Payer: Self-pay | Admitting: *Deleted

## 2017-12-29 DIAGNOSIS — Z9989 Dependence on other enabling machines and devices: Secondary | ICD-10-CM

## 2017-12-29 DIAGNOSIS — G4733 Obstructive sleep apnea (adult) (pediatric): Secondary | ICD-10-CM

## 2017-12-29 NOTE — Telephone Encounter (Signed)
Split night study sent to pre cert

## 2017-12-30 ENCOUNTER — Telehealth: Payer: Self-pay | Admitting: *Deleted

## 2017-12-30 NOTE — Telephone Encounter (Signed)
PA  Submitted via phone to [employer] for split-night sleep study. Clinicals faxed to [phone]. Reference # [id number].

## 2017-12-30 NOTE — Telephone Encounter (Signed)
-----   Message from Freada Bergeron, [place] sent at 12/28/2017  1:46 PM EDT ----- Regarding: pre cert  Split night  Please use [employer] first as primary ins Then use [employer] as secondary ins.

## 2018-01-03 ENCOUNTER — Telehealth: Payer: Self-pay | Admitting: *Deleted

## 2018-01-03 NOTE — Telephone Encounter (Signed)
Staff message sent to [facility] authorization received from [place]. Ok to schedule sleep study. Reference # [id number]. Valid dates 01/02/18 to 02/01/18.

## 2018-01-03 NOTE — Telephone Encounter (Signed)
-----   Message from Freada Bergeron, [place] sent at 12/28/2017  1:46 PM EDT ----- Regarding: pre cert  Split night  Please use [employer] first as primary ins Then use [employer] as secondary ins.

## 2018-01-04 ENCOUNTER — Telehealth: Payer: Self-pay | Admitting: *Deleted

## 2018-01-04 ENCOUNTER — Other Ambulatory Visit: Payer: Self-pay

## 2018-01-04 ENCOUNTER — Encounter: Payer: Self-pay | Admitting: Radiation Oncology

## 2018-01-04 ENCOUNTER — Ambulatory Visit
Admission: RE | Admit: 2018-01-04 | Discharge: 2018-01-04 | Disposition: A | Discharge status: Home / Self Care | Payer: Federal, State, Local not specified - PPO | Source: Ambulatory Visit | Attending: Radiation Oncology | Admitting: Radiation Oncology

## 2018-01-04 DIAGNOSIS — E669 Obesity, unspecified: Secondary | ICD-10-CM | POA: Diagnosis not present

## 2018-01-04 DIAGNOSIS — C61 Malignant neoplasm of prostate: Secondary | ICD-10-CM | POA: Diagnosis not present

## 2018-01-04 DIAGNOSIS — Z79899 Other long term (current) drug therapy: Secondary | ICD-10-CM | POA: Insufficient documentation

## 2018-01-04 DIAGNOSIS — I10 Essential (primary) hypertension: Secondary | ICD-10-CM | POA: Insufficient documentation

## 2018-01-04 DIAGNOSIS — I259 Chronic ischemic heart disease, unspecified: Secondary | ICD-10-CM | POA: Diagnosis not present

## 2018-01-04 DIAGNOSIS — M1990 Unspecified osteoarthritis, unspecified site: Secondary | ICD-10-CM | POA: Diagnosis not present

## 2018-01-04 DIAGNOSIS — I2510 Atherosclerotic heart disease of native coronary artery without angina pectoris: Secondary | ICD-10-CM | POA: Insufficient documentation

## 2018-01-04 DIAGNOSIS — R972 Elevated prostate specific antigen [PSA]: Secondary | ICD-10-CM | POA: Diagnosis not present

## 2018-01-04 DIAGNOSIS — E119 Type 2 diabetes mellitus without complications: Secondary | ICD-10-CM | POA: Insufficient documentation

## 2018-01-04 HISTORY — DX: Malignant neoplasm of prostate: C61

## 2018-01-04 NOTE — Progress Notes (Signed)
See progress note under physician encounter. 

## 2018-01-04 NOTE — Progress Notes (Signed)
2 Radiation Oncology         [phone] ________________________________  Initial Outpatient Consultation  Name: [name] MRN: [id number]  Date: 01/04/2018  DOB: [birth year]  HA:LPFXTKW, Fransico Him, MD  [name], MD   REFERRING PHYSICIAN: [name], MD  DIAGNOSIS: 67 y.o. gentleman with intermediate risk, Stage T1c adenocarcinoma of the prostate with Gleason Score of 3+4, and PSA of 5.5    ICD-10-CM   1. Malignant neoplasm of prostate ([place]) C61     HISTORY OF PRESENT ILLNESS: [name] is a 67 y.o. male with a diagnosis of prostate cancer. He was noted to have an elevated PSA of 5.5 by his primary care physician, Dr. Osborne Casco.  Accordingly, he was referred for evaluation in urology by Dr. [name],  digital rectal examination was performed at that time revealing symmetrical prostate lobes without discrete nodularity.  The patient proceeded to transrectal ultrasound with 12 biopsies of the prostate on 11/15/17.  The prostate volume measured 31.7 cc.  Out of 12 core biopsies,6 were positive.  The maximum Gleason score was 3+4, and this was seen in the left mid lateral gland.  Additionally there was Gleason 3+3 disease in the left base, left base lateral, right base lateral, right mid lateral and right apex lateral.  Of note, the patient has multiple medical comorbidities including coronary artery disease, obesity, hypertension, sleep apnea and diabetes mellitus.  Dr. Pernell Dupre is his cardiologist but he has not been seen by Dr. Tamala Julian in over a year.  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  He self-referred for consult with Dr. Alinda Money on 12/13/2017 to discuss the potential option for prostatectomy in the treatment of his disease.  Ultimately, it was decided that he would not be an ideal surgical candidate due to his multiple medical comorbidities and was instead felt to be a better candidate for definitive radiotherapy for treatment of his  disease.  The patient reviewed the biopsy results with his urologist and he has kindly been referred today for discussion of potential radiation treatment options.   PREVIOUS RADIATION THERAPY: No  PAST MEDICAL HISTORY:  Past Medical History:  Diagnosis Date  . Adenomatous colon polyp 2011  . Anemia   . Angina pectoris ([place]) 05/01/2015   med rx for 95% OM (u/a to access due to tortuosity), 95% D1 and other moderate CAD at cath  . Arthritis    "back, knees" (05/01/2015)  . Chest pain    pleuritic  . Chronic lower back pain   . Coronary artery disease   . Depression   . GERD (gastroesophageal reflux disease)   . HTN (hypertension)   . Hyperlipidemia   . Obstructive sleep apnea    noncompliant with CPAP  . Osteoarthritis   . Pneumonia ~ 2014 X 1  . Refusal of blood transfusions as patient is Jehovah's Witness   . Type II or unspecified type diabetes mellitus without mention of complication, not stated as uncontrolled       PAST SURGICAL HISTORY: Past Surgical History:  Procedure Laterality Date  . CARDIAC CATHETERIZATION  2004   patent coronary arteries  . CARDIAC CATHETERIZATION  05/01/2015   "tried to put stent in but couldn't"  . CARDIAC CATHETERIZATION N/A 05/01/2015   Procedure: Left Heart Cath and Coronary Angiography;  Surgeon: Belva Crome, MD; LAD 60%, oD1 90%, pD1 70%, D2 70%, CFX patent stent, 30% distal to prev stent, pRCA 20%, OM1 90/95%, NL LV  .  CARDIAC CATHETERIZATION N/A 05/01/2015   Procedure: Coronary Stent Intervention;  Surgeon: Belva Crome, MD;  Unsuccessful PCI OM due to tortuosity  . CARDIAC CATHETERIZATION N/A 11/06/2014   Procedure: Left Heart Cath and Coronary Angiography;  Surgeon: Belva Crome, MD;  Location: [facility];  Service: Cardiovascular;  Laterality: N/A;  . CLOSED REDUCTION SHOULDER DISLOCATION Right ~ 1975   "& reattached muscle"  . COLONOSCOPY W/ BIOPSIES AND POLYPECTOMY  X 2  . ESOPHAGOGASTRODUODENOSCOPY ENDOSCOPY    .  PILONIDAL CYST EXCISION      FAMILY HISTORY:  Family History  Problem Relation Age of Onset  . Coronary artery disease Other        CABG  . Coronary artery disease Mother   . Stroke Mother   . Heart disease Mother   . Diabetes Mother   . Other Father   . Diabetes Sister   . Diabetes Maternal Uncle        x2  . Alcohol abuse Other   . Diabetes Paternal Uncle        x2  . Colon cancer Paternal Uncle   . Colon polyps Neg Hx   . Esophageal cancer Neg Hx     SOCIAL HISTORY:  Social History   Socioeconomic History  . Marital status: Married    Spouse name: Not on file  . Number of children: 4  . Years of education: Not on file  . Highest education level: Not on file  Occupational History  . Occupation: Retired    Fish farm manager: UNEMPLOYED  Social Needs  . Financial resource strain: Not on file  . Food insecurity:    Worry: Not on file    Inability: Not on file  . Transportation needs:    Medical: Not on file    Non-medical: Not on file  Tobacco Use  . Smoking status: Former Smoker    Packs/day: 0.50    Years: 10.00    Pack years: 5.00    Types: Cigarettes    Last attempt to quit: 04/06/1983    Years since quitting: 34.7  . Smokeless tobacco: Never Used  Substance and Sexual Activity  . Alcohol use: Yes    Alcohol/week: 0.0 standard drinks    Comment: `/26/2017 "might drink a beer q couple months mostly; summertime I might drink 2-3 beers/week"  . Drug use: No  . Sexual activity: Not Currently  Lifestyle  . Physical activity:    Days per week: Not on file    Minutes per session: Not on file  . Stress: Not on file  Relationships  . Social connections:    Talks on phone: Not on file    Gets together: Not on file    Attends religious service: Not on file    Active member of club or organization: Not on file    Attends meetings of clubs or organizations: Not on file    Relationship status: Not on file  . Intimate partner violence:    Fear of current or ex  partner: Not on file    Emotionally abused: Not on file    Physically abused: Not on file    Forced sexual activity: Not on file  Other Topics Concern  . Not on file  Social History Narrative   Lives in [place] with spouse Falon Flinchum).  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    ALLERGIES: Statins and Imdur [isosorbide dinitrate]  MEDICATIONS:  Current Outpatient Medications  Medication Sig Dispense Refill  . Blood Glucose Monitoring Suppl (RELION PREMIER BLU MONITOR) DEVI 1 Device by Does not apply route daily. 1 Device 0  . esomeprazole (NEXIUM) 40 MG capsule Take 40 mg by mouth daily as needed (heartburn or acid reflux).    . fluticasone (FLONASE) 50 MCG/ACT nasal spray Place 1 spray into both nostrils daily as needed.    Marland Kitchen glipiZIDE (GLUCOTROL XL) 5 MG 24 hr tablet TAKE 1 TABLET BY MOUTH TWICE A DAY 180 tablet 1  . glucose blood test strip Use as instructed to check sugar 2 times daily 200 each 5  . hydrALAZINE (APRESOLINE) 25 MG tablet TAKE 1 TABLET BY MOUTH TWICE A DAY 60 tablet 5  . Insulin Detemir (LEVEMIR FLEXTOUCH) 100 UNIT/ML Pen Inject 16 Units into the skin daily at 10 pm. 15 mL 11  . Insulin Pen Needle (NOVOFINE PLUS) 32G X 4 MM MISC Use 1x a day 100 each 11  . latanoprost (XALATAN) 0.005 % ophthalmic solution Place 1 drop into both eyes at bedtime.  5  . losartan-hydrochlorothiazide (HYZAAR) 100-25 MG tablet Take 1 tablet by mouth daily. 90 tablet 3  . metFORMIN (GLUCOPHAGE) 500 MG tablet TAKE 2 TABLETS (1,000 MG TOTAL) BY MOUTH 2 (TWO) TIMES DAILY WITH A MEAL. 360 tablet 0  . metoprolol tartrate (LOPRESSOR) 25 MG tablet TAKE 1 TABLET(25 MG) BY MOUTH IN AM AND TAKE 1/2 TABLET(12.5 MG) BY MOUTH IN PM 135 tablet 1  . nitroGLYCERIN (NITROSTAT) 0.4 MG SL tablet PLACE ONE TABLET UNDER THE TONGUE EVERY 5 MINUTES X3 DOSES AS NEEDED FOR CHEST PAIN 75 tablet 1  . pravastatin (PRAVACHOL) 40 MG tablet Take 40 mg by mouth daily.   3  . traZODone (DESYREL) 50 MG tablet Take 1 tablet (50 mg total) by mouth at bedtime as needed for sleep. 30 tablet 3   No current facility-administered medications for this encounter.     REVIEW OF SYSTEMS:  On review of systems, the patient reports that he is doing well overall. He denies any chest pain, shortness of breath, cough, fevers, chills, night sweats, unintended weight changes. He denies any bowel disturbances, and denies abdominal pain, nausea or vomiting. He notes urinary leakage and nocturia that resolved following his biopsy, citing possibility of help from antibiotics given during procedure. He denies any new musculoskeletal or joint aches or pains. His IPSS was 5, indicating mild urinary symptoms. He is unable to complete sexual activity with most attempts. He reports semen is dark and cloudy. A complete review of systems is obtained and is otherwise negative.    PHYSICAL EXAM:  Wt Readings from Last 3 Encounters:  01/04/18 250 lb 3.2 oz (113.5 kg)  12/27/17 251 lb 1.9 oz (113.9 kg)  11/23/17 253 lb 8 oz (115 kg)   Temp Readings from Last 3 Encounters:  01/04/18 98.5 F (36.9 C) (Oral)  05/18/17 98.7 F (37.1 C)  03/25/16 97.7 F (36.5 C) (Oral)   BP Readings from Last 3 Encounters:  01/04/18 (!) 162/80  12/27/17 (!) 142/72  11/29/17 (!) 169/93   Pulse Readings from Last 3 Encounters:  01/04/18 78  12/27/17 81  11/29/17 80   Pain Assessment Pain Score: 0-No pain/10  In general this is a well appearing African American gentleman in no acute distress. He is alert and oriented x4 and appropriate throughout the examination. HEENT reveals that the patient is normocephalic, atraumatic. EOMs are intact.  Skin is intact without any evidence of  gross lesions. Cardiovascular exam reveals a regular rate and rhythm, no clicks rubs or murmurs are auscultated. Chest is clear to auscultation bilaterally. Lymphatic assessment is performed and does not reveal any adenopathy in  the cervical, supraclavicular, axillary, or inguinal chains. Abdomen has active bowel sounds in all quadrants and is intact. The abdomen is soft, non tender, non distended. Lower extremities are negative for pretibial pitting edema, deep calf tenderness, cyanosis or clubbing.   KPS = 100  100 - Normal; no complaints; no evidence of disease. 90   - Able to carry on normal activity; minor signs or symptoms of disease. 80   - Normal activity with effort; some signs or symptoms of disease. 72   - Cares for self; unable to carry on normal activity or to do active work. 60   - Requires occasional assistance, but is able to care for most of his personal needs. 50   - Requires considerable assistance and frequent medical care. 64   - Disabled; requires special care and assistance. 46   - Severely disabled; hospital admission is indicated although death not imminent. 28   - Very sick; hospital admission necessary; active supportive treatment necessary. 10   - Moribund; fatal processes progressing rapidly. 0     - Dead  Karnofsky DA, Abelmann Fries, Craver LS and [facility] [phone]) The use of the nitrogen mustards in the palliative treatment of carcinoma: with particular reference to bronchogenic carcinoma Cancer 1 634-56  LABORATORY DATA:  Lab Results  Component Value Date   WBC 6.5 05/18/2017   HGB 13.8 05/18/2017   HCT 42.4 05/18/2017   MCV 85.1 05/18/2017   PLT 216 05/18/2017   Lab Results  Component Value Date   NA 138 05/18/2017   K 3.9 05/18/2017   CL 101 05/18/2017   CO2 26 05/18/2017   Lab Results  Component Value Date   ALT 17 06/19/2015   AST 16 06/19/2015   ALKPHOS 85 06/19/2015   BILITOT 0.4 06/19/2015     RADIOGRAPHY: No results found.    IMPRESSION/PLAN: 1. 67 y.o. gentleman with intermediate risk, Stage T1c adenocarcinoma of the prostate with Gleason Score of 3+4, and PSA of 5.5. We discussed the patient's workup and outlines the nature of prostate cancer in this  setting. The patient's T stage, Gleason's score, and PSA put him into the favorable intermediate risk group. Accordingly, he is eligible for a variety of potential treatment options including brachytherapy, 5.5 - 8 weeks of external radiation, or 5 weeks of external radiation followed by a brachytherapy boost. We discussed the available radiation techniques, and focused on the details and logistics and delivery. We discussed and outlined the risks, benefits, short and long-term effects associated with radiotherapy and compared and contrasted these with prostatectomy. We discussed the role of SpaceOAR in reducing the rectal toxicity associated with radiotherapy. He was encouraged to ask questions that were answered to his stated satisfaction.  At the conclusion of our conversation, the patient elects to proceed with a 5.5 week course of daily external beam radiation in the treatment of his disease.  We will share our discussion with Dr. Alinda Money and Dr. Erlene Quan and move forward with treatment planning accordingly.  He will need fiducial markers and SpaceOAR gel placed in the near future, prior to scheduling CT simulation, in preparation for external beam radiation.  He appears to have a good understanding of his disease and our recommendations which are of curative intent. He is in agreement with the  stated plan and knows to call with any questions or concerns in the interim.   [name], PA-C    Tyler Pita, MD  [facility] Direct Dial: [phone]  Fax: [phone] [email or web address]  Skype  LinkedIn  This document serves as a record of services personally performed by [employer], PA-C and Dr. Tammi Klippel. It was created on their behalf by Wilburn Mylar, a trained medical scribe. The creation of this record is based on the scribe's personal observations and the provider's statements to them. This document has been checked and approved by the attending provider.

## 2018-01-04 NOTE — Telephone Encounter (Signed)
-----   Message from Freada Bergeron, [place] sent at 12/28/2017  1:46 PM EDT ----- Regarding: pre cert  Split night  Please use BCBS first as primary ins Then use HTA as secondary ins.

## 2018-01-04 NOTE — Telephone Encounter (Signed)
Submitted PA request to [employer] for sleep study. This has already been approved by [employer]. HTA is secondary.

## 2018-01-04 NOTE — Telephone Encounter (Signed)
-----   Message from Lauralee Evener, [place] sent at 01/03/2018  9:23 AM EDT ----- Regarding: RE: pre cert  Received approval from [place]. Ok to schedule.  Reference # [id number] Valid dates 01/02/18 to 02/01/18. ----- Message ----- From: Freada Bergeron, CMA Sent: 12/28/2017   1:46 PM EDT To: Cv Div Sleep Studies Subject: pre cert                                       Split night  Please use BCBS first as primary ins Then use HTA as secondary ins.

## 2018-01-04 NOTE — Telephone Encounter (Signed)
Patient is scheduled for lab study on 02/06/18. Patient understands his sleep study will be done at [facility] sleep lab. Patient understands he will receive a sleep packet in a week or so. Patient understands to call if he does not receive the sleep packet in a timely manner.  Left detailed message on voicemail with date and time of titration and informed patient to call back to confirm or reschedule.

## 2018-01-04 NOTE — Progress Notes (Signed)
GU Location of Tumor / Histology: prostatic adenocarcinoma  If Prostate Cancer, Gleason Score is (3 + 4) and PSA is (5.5). Prostate volume: 31.7 cc  [name] was noted to have an elevated PSA of 5.5 on 09/29/2017 and asymmetry of the prostate. Patient referred from PCP to Dr. Alinda Money for further evaluation.  Biopsies of prostate (if applicable) revealed:    Past/Anticipated interventions by urology, if any: biopsy, referral to radiation oncology   Patient has a history of CAD. 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 SHIM 9. Denies dysuria or hematuria. Reports semen is dark and cloudy. Reports urinary leakage before biopsy but none after.   Nausea/Vomiting, if any: no  Pain issues, if any:  Reports intermittent low back pain. Reports bilateral knee pain related to arthritis.   SAFETY ISSUES:  Prior radiation? no  Pacemaker/ICD? no  Possible current pregnancy? no  Is the patient on methotrexate? no  Current Complaints / other details:  67 year old male. Married. Retired. 4 grown children. Resides in [place].

## 2018-01-06 ENCOUNTER — Telehealth: Payer: Self-pay | Admitting: *Deleted

## 2018-01-06 ENCOUNTER — Encounter: Payer: Self-pay | Admitting: General Practice

## 2018-01-06 NOTE — Telephone Encounter (Signed)
Called patient's [employer] and spoke with Romeo Apple G to get sleep study auth dates extended. The new Auth dates are 02/06/18 to 03/08/18. Same reference # as before.

## 2018-01-06 NOTE — Progress Notes (Signed)
[place] Psychosocial Distress Screening Clinical Social Work  Clinical Social Work was referred by distress screening protocol.  The patient scored a 9 on the Psychosocial Distress Thermometer which indicates severe distress. Clinical Social Worker contacted patient by phone to assess for distress and other psychosocial needs. Attempted call to home and mobile numbers - home number not personally identified, mobile number has no VM set up.  CSW did not leave message on either phone.  Will attempt to reach patient at a later time.    ONCBCN DISTRESS SCREENING 01/06/2018  Screening Type Initial Screening  Distress experienced in past week (1-10) 9  Emotional problem type Depression;Nervousness/Anxiety;Adjusting to illness;Boredom;Adjusting to appearance changes  Physical Problem type Pain;Sleep/insomnia;Changes in urination;Sexual problems;Swollen arms/legs  Physician notified of physical symptoms Yes  Referral to clinical psychology No  Referral to clinical social work Yes  Referral to dietition No  Referral to financial advocate No  Referral to support programs No  Referral to palliative care No     Clinical Social Worker follow up needed: No.  If yes, follow up plan:  [name], [place], LCSW Clinical Social Worker Phone:  [phone]

## 2018-01-08 DIAGNOSIS — M25572 Pain in left ankle and joints of left foot: Secondary | ICD-10-CM | POA: Diagnosis not present

## 2018-01-09 NOTE — Telephone Encounter (Signed)
Return call:  Patient understands he referral date has been extended:   Called patient's BC[employer] and spoke with Romeo Apple G to get sleep study auth dates extended. The new Auth dates are 02/06/18 to 03/08/18. Same reference # as before.

## 2018-01-11 ENCOUNTER — Telehealth: Payer: Self-pay | Admitting: *Deleted

## 2018-01-11 DIAGNOSIS — R600 Localized edema: Secondary | ICD-10-CM | POA: Diagnosis not present

## 2018-01-11 NOTE — Telephone Encounter (Signed)
Left message for pt to call back to discuss his blood pressure with his report of it being 194/109 today.  ? If he has taken his medication today, what he is taking, how the reading was taken etc.

## 2018-01-11 NOTE — Telephone Encounter (Signed)
Message   ----- Message from Shellia Cleverly, RN sent at 01/11/2018 12:12 PM EDT -----  Elevated BP 194/109    ----- Message from [name] to Ronald Perch, NP sent at 01/11/2018 11:49 AM -----   My blood pressure is rather high since I saw you last. Same meds some same salt intake.today it's 194/109. It's been varying but always high very high. Been this way through [place] it ranged in around 130/80  Select Font Size      [name]  01/11/2018  Patient Message  MRN:  [id number]  Description: 67 year old male Provider: Mychart, Generic Provider Department: [facility] [facility]     Call Documentation   Shellia Cleverly, RN at 01/11/2018 12:09 PM   Status: Signed    Left message for pt to call back to discuss his blood pressure with his report of it being 194/109 today.  ? If he has taken his medication today, what he is taking, how the reading was taken etc.

## 2018-01-12 NOTE — Telephone Encounter (Signed)
Left message for patient to call back  

## 2018-01-13 ENCOUNTER — Telehealth: Payer: Self-pay | Admitting: *Deleted

## 2018-01-13 NOTE — Telephone Encounter (Signed)
Received Authorization from HTA for scheduled sleep study. Sleep study has already been approved by [employer] (federal). HTA  Auth # [id number].. Valid dates of 01/04/18 to 04/04/18. This Authorization has been added to assigned referral.

## 2018-01-16 ENCOUNTER — Encounter: Payer: Self-pay | Admitting: Medical Oncology

## 2018-01-16 NOTE — Telephone Encounter (Signed)
LVM for return call; pt has appt with lipid clinic tomorrow. Perhaps BP med change, if needed, could be addressed then.

## 2018-01-16 NOTE — Progress Notes (Signed)
Spoke with patient to follow up post consult with Dr. Tammi Klippel 01/04/18. I asked him if he has been scheduled for gold markers and SpaceOar and he states that he might change his mind on treatment. He is now thinking about surgery but according to Dr. Johny Shears note he is not a surgical candidate due to comorbidites. I will ask Ashlyn to call him back to discuss further.

## 2018-01-17 ENCOUNTER — Ambulatory Visit (INDEPENDENT_AMBULATORY_CARE_PROVIDER_SITE_OTHER): Payer: Federal, State, Local not specified - PPO | Admitting: Pharmacist

## 2018-01-17 ENCOUNTER — Encounter: Payer: Self-pay | Admitting: Medical Oncology

## 2018-01-17 ENCOUNTER — Telehealth: Payer: Self-pay | Admitting: Urology

## 2018-01-17 DIAGNOSIS — E785 Hyperlipidemia, unspecified: Secondary | ICD-10-CM | POA: Diagnosis not present

## 2018-01-17 NOTE — Patient Instructions (Addendum)
It was nice to meet you today  Your LDL cholesterol goal is < 70  Start using your Silver Sneakers program at the gym  Switch from fried chicken to grilled chicken  We will submit information to your insurance company to see if they will cover either Repatha or Praluent injections for your cholesterol  We will follow up once we hear back from your insurance  Call Jinny Blossom with any questions [phone]

## 2018-01-17 NOTE — Telephone Encounter (Signed)
My understanding is that the patient may be interested in surgery and has elected not to pursue XRT.  Please make him a follow up with me at my soonest appointment to discuss.     [name], MD

## 2018-01-17 NOTE — Progress Notes (Signed)
Patient ID: [name]                 DOB: Dec 19, 1950                    MRN: [id number]     HPI: [name] is a 67 y.o. male patient of Dr Tamala Julian referred to lipid clinic by Pecolia Ades, NP. PMH is significant for CAD s/p DES to LCx 11/2014, HTN, HLD, OSA not on CPAP, DM, and PAF. Cath in 04/2015 showed widely patent circumflex stent, 95% stenosis of the first OM with failed PTCA due to inability to cross the stenosis. He has a history of statin intolerance and presents to lipid clinic for further management.  Pt presents today in good spirits. He previously tried pravasatin, Livalo, atorvastatin, rosuvastatin, and Zetia, but experienced muscle aches and fatigue with each medication. His symptoms resolved with drug discontinuation. He is currently not on any lipid lowering therapy. Most recent lipid panel is from May and reflects pt still taking pravastatin with LDL still above goal at 98. He has 2 insurance plans - [employer] Medicare and [employer] BCBS, states his BCBS plan is his primary insurance.  Current Medications: none  Intolerances: pravastatin 20mg  and 40mg  daily, pravastatin 20mg  every other day, Zetia 10mg  daily, Livalo 2mg  daily, atorvastatin 40mg  daily, rosuvastatin - myalgias  Risk Factors: CAD s/p PCI, HTN, DM  LDL goal: 70mg /dL  Diet: Usually does not eat breakfast or lunch. Snacks a bit - muffin, unsweetened applesauce. Dinner is usually fast food - likes [employer], fried chicken, salads.  Exercise: Fatigued most of the time, does walk a bit. Has [employer] program with his insurance but has not been using.  Family History: Coronary artery disease in his mother and other; Diabetes in his maternal uncle, mother, paternal uncle, and sister; Heart disease in his mother; Other in his father; Stroke in his mother.  Social History: Former smoker, quit in 1985, occasional alcohol use, denies illicit drug use.  Labs: 08/25/17: TC 151, TG 78, HDL 37, LDL 98 (pravastatin 40mg   daily)  Past Medical History:  Diagnosis Date  . Adenomatous colon polyp 2011  . Anemia   . Angina pectoris ([place]) 05/01/2015   med rx for 95% OM (u/a to access due to tortuosity), 95% D1 and other moderate CAD at cath  . Arthritis    "back, knees" (05/01/2015)  . Chest pain    pleuritic  . Chronic lower back pain   . Coronary artery disease   . Depression   . GERD (gastroesophageal reflux disease)   . HTN (hypertension)   . Hyperlipidemia   . Obstructive sleep apnea    noncompliant with CPAP  . Osteoarthritis   . Pneumonia ~ 2014 X 1  . Prostate cancer ([place])   . Refusal of blood transfusions as patient is Jehovah's Witness   . Type II or unspecified type diabetes mellitus without mention of complication, not stated as uncontrolled     Current Outpatient Medications on File Prior to Visit  Medication Sig Dispense Refill  . Blood Glucose Monitoring Suppl (RELION PREMIER BLU MONITOR) DEVI 1 Device by Does not apply route daily. 1 Device 0  . esomeprazole (NEXIUM) 40 MG capsule Take 40 mg by mouth daily as needed (heartburn or acid reflux).    . fluticasone (FLONASE) 50 MCG/ACT nasal spray Place 1 spray into both nostrils daily as needed.    Marland Kitchen glipiZIDE (GLUCOTROL XL) 5 MG 24  hr tablet TAKE 1 TABLET BY MOUTH TWICE A DAY 180 tablet 1  . glucose blood test strip Use as instructed to check sugar 2 times daily 200 each 5  . hydrALAZINE (APRESOLINE) 25 MG tablet TAKE 1 TABLET BY MOUTH TWICE A DAY 60 tablet 5  . Insulin Detemir (LEVEMIR FLEXTOUCH) 100 UNIT/ML Pen Inject 16 Units into the skin daily at 10 pm. 15 mL 11  . Insulin Pen Needle (NOVOFINE PLUS) 32G X 4 MM MISC Use 1x a day 100 each 11  . latanoprost (XALATAN) 0.005 % ophthalmic solution Place 1 drop into both eyes at bedtime.  5  . losartan-hydrochlorothiazide (HYZAAR) 100-25 MG tablet Take 1 tablet by mouth daily. 90 tablet 3  . metFORMIN (GLUCOPHAGE) 500 MG tablet TAKE 2 TABLETS (1,000 MG TOTAL) BY MOUTH 2 (TWO) TIMES DAILY  WITH A MEAL. 360 tablet 0  . metoprolol tartrate (LOPRESSOR) 25 MG tablet TAKE 1 TABLET(25 MG) BY MOUTH IN AM AND TAKE 1/2 TABLET(12.5 MG) BY MOUTH IN PM 135 tablet 1  . nitroGLYCERIN (NITROSTAT) 0.4 MG SL tablet PLACE ONE TABLET UNDER THE TONGUE EVERY 5 MINUTES X3 DOSES AS NEEDED FOR CHEST PAIN 75 tablet 1  . pravastatin (PRAVACHOL) 40 MG tablet Take 40 mg by mouth daily.  3  . traZODone (DESYREL) 50 MG tablet Take 1 tablet (50 mg total) by mouth at bedtime as needed for sleep. 30 tablet 3   No current facility-administered medications on file prior to visit.     Allergies  Allergen Reactions  . Statins Other (See Comments)    REACTION: joint pain Lipitor- headaches Has also tried Livalo, pravachol, zetia, crestor, welchol  . Imdur [Isosorbide Dinitrate]     Headache - "violent"    Assessment/Plan:  1. Hyperlipidemia - LDL above goal < 70 due to hx of CAD. Pt is intolerant to 4 statins and Zetia. Discussed PCSK9i therapy including expected benefits and injection technique. Will submit prior authorization for Repatha and f/u with pt once we hear back from insurance.   Megan E. Supple, PharmD, BCACP, [facility] [id number] [place], [place] [address] Phone: [phone]; Fax: [phone] 01/17/2018 3:22 PM

## 2018-01-18 LAB — LDL CHOLESTEROL, DIRECT: LDL Direct: 117 mg/dL — ABNORMAL HIGH (ref 0–99)

## 2018-01-23 NOTE — Telephone Encounter (Signed)
Called patient and he said that he was not ready yet, he was still seeing other doctor's for second opinions in [place] and would call our office back when he had made a decision.   Sharyn Lull

## 2018-01-24 ENCOUNTER — Other Ambulatory Visit: Payer: Self-pay | Admitting: Internal Medicine

## 2018-01-24 ENCOUNTER — Telehealth: Payer: Self-pay | Admitting: *Deleted

## 2018-01-24 ENCOUNTER — Other Ambulatory Visit: Payer: Self-pay | Admitting: Urology

## 2018-01-24 NOTE — Telephone Encounter (Signed)
CALLED PATIENT TO INFORM OF FID. MARKERS AND SPACE OAR TO BE PLACED @ [facility] MAIN OR ON 02-20-18 @ 4 PM AND HIS SIM ON 02-23-18 @ 1 PM @ DR. MANNING'S OFFICE, SPOKE WITH PATIENT AND HE IS AWARE OF THESE APPTS.

## 2018-01-25 ENCOUNTER — Telehealth: Payer: Self-pay | Admitting: Pharmacist

## 2018-01-25 MED ORDER — EVOLOCUMAB 140 MG/ML ~~LOC~~ SOAJ: 1.0000 "pen " | SUBCUTANEOUS | 11 refills | Status: DC

## 2018-01-25 NOTE — Telephone Encounter (Signed)
Repatha prior authorization approved. Rx sent to pharmacy to determine copay.

## 2018-01-26 NOTE — Telephone Encounter (Addendum)
Repatha copay $132 per month which is cost prohibitive. Will submit patient assistance through the [employer] to see if they will cover pt. He does not know household annual income and will look this up then call us back so we can submit SNF application.

## 2018-01-28 ENCOUNTER — Other Ambulatory Visit: Payer: Self-pay | Admitting: Cardiology

## 2018-01-31 NOTE — Telephone Encounter (Signed)
Great.  Thanks

## 2018-01-31 NOTE — Addendum Note (Signed)
Addended by: De Libman E on: 01/31/2018 12:28 PM   Modules accepted: Orders

## 2018-01-31 NOTE — Telephone Encounter (Signed)
Called pt to follow up with income. He reports he makes over the cutoff so he will not qualify for patient assistance and is unable to afford Repatha copay. He is already intolerant to pravastatin 20mg  and 40mg  daily, pravastatin 20mg  every other day, Zetia 10mg  daily, Livalo 2mg  daily, atorvastatin 40mg  daily, rosuvastatin - myalgias with all.  He is interested in learning more about our lipid clinical trials. Will route to research to follow up with patient for screening, and will route to Dr Tamala Julian as an Juluis Rainier.

## 2018-02-02 ENCOUNTER — Other Ambulatory Visit: Payer: Self-pay | Admitting: *Deleted

## 2018-02-02 MED ORDER — LOSARTAN POTASSIUM 100 MG PO TABS: 100.0000 mg | ORAL_TABLET | Freq: Every day | ORAL | 0 refills | Status: DC

## 2018-02-02 MED ORDER — HYDROCHLOROTHIAZIDE 25 MG PO TABS: 25.0000 mg | ORAL_TABLET | Freq: Every day | ORAL | 0 refills | Status: DC

## 2018-02-06 ENCOUNTER — Ambulatory Visit (HOSPITAL_BASED_OUTPATIENT_CLINIC_OR_DEPARTMENT_OTHER): Payer: Federal, State, Local not specified - PPO | Attending: Cardiology | Admitting: Cardiology

## 2018-02-06 ENCOUNTER — Encounter

## 2018-02-06 ENCOUNTER — Ambulatory Visit (INDEPENDENT_AMBULATORY_CARE_PROVIDER_SITE_OTHER): Payer: Federal, State, Local not specified - PPO | Admitting: Internal Medicine

## 2018-02-06 ENCOUNTER — Encounter: Payer: Self-pay | Admitting: Internal Medicine

## 2018-02-06 VITALS — BP 148/80 | HR 85 | Ht 70.0 in | Wt 254.0 lb

## 2018-02-06 VITALS — Ht 70.0 in | Wt 250.0 lb

## 2018-02-06 DIAGNOSIS — E669 Obesity, unspecified: Secondary | ICD-10-CM | POA: Diagnosis not present

## 2018-02-06 DIAGNOSIS — E1159 Type 2 diabetes mellitus with other circulatory complications: Secondary | ICD-10-CM

## 2018-02-06 DIAGNOSIS — G4733 Obstructive sleep apnea (adult) (pediatric): Secondary | ICD-10-CM | POA: Diagnosis not present

## 2018-02-06 DIAGNOSIS — R0902 Hypoxemia: Secondary | ICD-10-CM | POA: Insufficient documentation

## 2018-02-06 DIAGNOSIS — Z9989 Dependence on other enabling machines and devices: Secondary | ICD-10-CM | POA: Diagnosis not present

## 2018-02-06 DIAGNOSIS — E785 Hyperlipidemia, unspecified: Secondary | ICD-10-CM

## 2018-02-06 LAB — POCT GLYCOSYLATED HEMOGLOBIN (HGB A1C): Hemoglobin A1C: 7 % — AB (ref 4.0–5.6)

## 2018-02-06 MED ORDER — INSULIN DETEMIR 100 UNIT/ML FLEXPEN: 20.0000 [IU] | PEN_INJECTOR | Freq: Every day | SUBCUTANEOUS | 11 refills | Status: DC

## 2018-02-06 NOTE — Progress Notes (Signed)
Patient ID: [name], male   DOB: Jul 05, 1950, 67 y.o.   MRN: [id number]  HPI: [name] is a 67 y.o.-year-old male, returning for f/u for DM2, dx 2007, prev. insulin-dependent since 2014, uncontrolled, with complications (CAD - s/p NSTEMI 11/2014 - stent; ED). Last visit 4 months ago. PCP: Dr Odette Fraction.  He had gold beads placed in his prostate for RxTx >> will start later this month.  He had a gout attack 2 weeks ago.   Since last visit, he relaxed diet and is also eating at night.  Sugars are higher.  Last hemoglobin A1c was: Lab Results  Component Value Date   HGBA1C 6.6 (A) 10/04/2017   HGBA1C 8.9 07/05/2017   HGBA1C 7.4 12/10/2016   Pt is on a regimen of: - Metformin 1000 mg 2x a day - Glipizide XL 5 mg 2x a day - break/crush the evening dose  - Levemir 16 units at bedtime - added 07/2017 He tried Toujeo 20 units at night but did not like it He did not start Januvia as suggested at last visit ("I don't think I need it"). He was Levemir 140 units in am >> decreased to 70 units>> stopped as this was too expensive for him He stopped Welchol 3.7g at bedtime a day. Stopped Farxiga 10 mg b/c frequent urination and urgency. He was on Glipizide in the past. He was on Metformin in the past >> no N/V.   Pt checks his sugars 1X a day: - am: 115-130 >> 180-200 >> 106-130 >> 105, 147-157 - 2h after b'fast: n/c >> 128, 134 >> n/c - before lunch:   206 >> n/c >> 140s >> n/c - 2h after lunch: n/c >> 140-157 >> n/c - before dinner:  106-120 >> n/c >> 80-130 >> n/c - 2h after dinner: 200-245 >> 150-180, 200 >> n/c - bedtime:  151 >> 127-180, 200 >> n/c >> 180-200 - nighttime:94, 190-305 >> 148-216 >> n/c  Lowest sugar was 150 >> 80 >> 105; he has hypoglycemia awareness in the 80s. Highest sugar was 230 >> 200 (apple pie) >> 220.  -+ Mild CKD, last BUN/creatinine:  Lab Results  Component Value Date   BUN 8 05/18/2017   CREATININE 0.88 05/18/2017  09/07/2016: 13/0.9, glucose  133 On losartan. -+ HL; last set of lipids: Had labs by PCP this year.   09/07/2016: 167/65/33/121 Lab Results  Component Value Date   CHOL 164 06/19/2015   HDL 38.40 (L) 06/19/2015   LDLCALC 114 (H) 06/19/2015   LDLDIRECT 117 (H) 01/17/2018   TRIG 61.0 06/19/2015   CHOLHDL 4 06/19/2015  On pravastatin 40. - last eye exam was in 2019: No DR -No numbness and tingling in his feet.  Pt was admitted for CP in 11/2014: NSTEMI >> CAD >> PTCA >> stent placed.  He was in ED with CP 05/2017 >> no cardiac pathology.  ROS: Constitutional: no weight gain/no weight loss, no fatigue, no subjective hyperthermia, no subjective hypothermia Eyes: no blurry vision, no xerophthalmia ENT: no sore throat, no nodules palpated in neck, no dysphagia, no odynophagia, no hoarseness Cardiovascular: no CP/no SOB/no palpitations/no leg swelling Respiratory: no cough/no SOB/no wheezing Gastrointestinal: no N/no V/no D/no C/no acid reflux Musculoskeletal: no muscle aches/no joint aches Skin: no rashes, no hair loss Neurological: no tremors/no numbness/no tingling/no dizziness  I reviewed pt's medications, allergies, PMH, social hx, family hx, and changes were documented in the history of present illness. Otherwise, unchanged from my initial visit note.  Past Medical History:  Diagnosis Date  . Adenomatous colon polyp 2011  . Anemia   . Angina pectoris ([place]) 05/01/2015   med rx for 95% OM (u/a to access due to tortuosity), 95% D1 and other moderate CAD at cath  . Arthritis    "back, knees" (05/01/2015)  . Chest pain    pleuritic  . Chronic lower back pain   . Coronary artery disease   . Depression   . GERD (gastroesophageal reflux disease)   . HTN (hypertension)   . Hyperlipidemia   . Obstructive sleep apnea    noncompliant with CPAP  . Osteoarthritis   . Pneumonia ~ 2014 X 1  . Prostate cancer ([place])   . Refusal of blood transfusions as patient is Jehovah's Witness   . Type II or unspecified  type diabetes mellitus without mention of complication, not stated as uncontrolled    Past Surgical History:  Procedure Laterality Date  . CARDIAC CATHETERIZATION  2004   patent coronary arteries  . CARDIAC CATHETERIZATION  05/01/2015   "tried to put stent in but couldn't"  . CARDIAC CATHETERIZATION N/A 05/01/2015   Procedure: Left Heart Cath and Coronary Angiography;  Surgeon: Belva Crome, MD; LAD 60%, oD1 90%, pD1 70%, D2 70%, CFX patent stent, 30% distal to prev stent, pRCA 20%, OM1 90/95%, NL LV  . CARDIAC CATHETERIZATION N/A 05/01/2015   Procedure: Coronary Stent Intervention;  Surgeon: Belva Crome, MD;  Unsuccessful PCI OM due to tortuosity  . CARDIAC CATHETERIZATION N/A 11/06/2014   Procedure: Left Heart Cath and Coronary Angiography;  Surgeon: Belva Crome, MD;  Location: [facility];  Service: Cardiovascular;  Laterality: N/A;  . CLOSED REDUCTION SHOULDER DISLOCATION Right ~ 1975   "& reattached muscle"  . COLONOSCOPY W/ BIOPSIES AND POLYPECTOMY  X 2  . ESOPHAGOGASTRODUODENOSCOPY ENDOSCOPY    . PILONIDAL CYST EXCISION     Social History   Socioeconomic History  . Marital status: Married    Spouse name: [name]  . Number of children: 4  . Years of education: Not on file  . Highest education level: Not on file  Occupational History  . Occupation: Retired    Fish farm manager: UNEMPLOYED  Social Needs  . Financial resource strain: Not on file  . Food insecurity:    Worry: Not on file    Inability: Not on file  . Transportation needs:    Medical: Not on file    Non-medical: Not on file  Tobacco Use  . Smoking status: Former Smoker    Packs/day: 0.50    Years: 10.00    Pack years: 5.00    Types: Cigarettes    Last attempt to quit: 04/06/1983    Years since quitting: 34.8  . Smokeless tobacco: Never Used  Substance and Sexual Activity  . Alcohol use: Yes    Alcohol/week: 0.0 standard drinks    Comment: `/26/2017 "might drink a beer q couple months mostly; summertime  I might drink 2-3 beers/week"  . Drug use: No  . Sexual activity: Not Currently  Lifestyle  . Physical activity:    Days per week: Not on file    Minutes per session: Not on file  . Stress: Not on file  Relationships  . Social connections:    Talks on phone: Not on file    Gets together: Not on file    Attends religious service: Not on file    Active member of club or organization: Not on file  Attends meetings of clubs or organizations: Not on file    Relationship status: Not on file  . Intimate partner violence:    Fear of current or ex partner: Not on file    Emotionally abused: Not on file    Physically abused: Not on file    Forced sexual activity: Not on file  Other Topics Concern  . Not on file  Social History Narrative   Lives in [place] with spouse Lenon Kuennen).  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   Current Outpatient Medications on File Prior to Visit  Medication Sig Dispense Refill  . Blood Glucose Monitoring Suppl (RELION PREMIER BLU MONITOR) DEVI 1 Device by Does not apply route daily. 1 Device 0  . esomeprazole (NEXIUM) 40 MG capsule Take 40 mg by mouth daily as needed (heartburn or acid reflux).    . fluticasone (FLONASE) 50 MCG/ACT nasal spray Place 1 spray into both nostrils daily as needed.    Marland Kitchen glipiZIDE (GLUCOTROL XL) 5 MG 24 hr tablet TAKE 1 TABLET BY MOUTH TWICE A DAY 180 tablet 1  . glucose blood test strip Use as instructed to check sugar 2 times daily 200 each 5  . hydrALAZINE (APRESOLINE) 25 MG tablet TAKE 1 TABLET BY MOUTH TWICE A DAY 60 tablet 5  . hydrochlorothiazide (HYDRODIURIL) 25 MG tablet Take 1 tablet (25 mg total) by mouth daily. 90 tablet 0  . Insulin Detemir (LEVEMIR FLEXTOUCH) 100 UNIT/ML Pen Inject 16 Units into the skin daily at 10 pm. 15 mL 11  . Insulin Pen Needle (NOVOFINE PLUS) 32G X 4 MM MISC Use 1x a day 100 each 11  . latanoprost (XALATAN) 0.005 % ophthalmic solution  Place 1 drop into both eyes at bedtime.  5  . losartan (COZAAR) 100 MG tablet Take 1 tablet (100 mg total) by mouth daily. 90 tablet 0  . losartan-hydrochlorothiazide (HYZAAR) 100-25 MG tablet TAKE 1 TABLET BY MOUTH EVERY DAY 90 tablet 2  . metFORMIN (GLUCOPHAGE) 500 MG tablet TAKE 2 TABLETS (1,000 MG TOTAL) BY MOUTH 2 (TWO) TIMES DAILY WITH A MEAL. 360 tablet 0  . metoprolol tartrate (LOPRESSOR) 25 MG tablet TAKE 1 TABLET(25 MG) BY MOUTH IN AM AND TAKE 1/2 TABLET(12.5 MG) BY MOUTH IN PM 135 tablet 1  . nitroGLYCERIN (NITROSTAT) 0.4 MG SL tablet PLACE ONE TABLET UNDER THE TONGUE EVERY 5 MINUTES X3 DOSES AS NEEDED FOR CHEST PAIN 75 tablet 1  . pravastatin (PRAVACHOL) 40 MG tablet Take 40 mg by mouth daily.  3  . traZODone (DESYREL) 50 MG tablet Take 1 tablet (50 mg total) by mouth at bedtime as needed for sleep. 30 tablet 3   No current facility-administered medications on file prior to visit.    Allergies  Allergen Reactions  . Statins Other (See Comments)    REACTION: joint pain Lipitor- headaches Has also tried Livalo, pravachol, zetia, crestor, welchol  . Imdur [Isosorbide Dinitrate]     Headache - "violent"   Family History  Problem Relation Age of Onset  . Coronary artery disease Other        CABG  . Coronary artery disease Mother   . Stroke Mother   . Heart disease Mother   . Diabetes Mother   . Other Father   . Diabetes Sister   . Diabetes Maternal Uncle        x2  . Alcohol abuse Other   . Diabetes Paternal Uncle  x2  . Colon cancer Paternal Uncle   . Colon polyps Neg Hx   . Esophageal cancer Neg Hx     PE: BP (!) 148/80   Pulse 85   Ht 5\' 10"  (1.778 m) Comment: measured  Wt 254 lb (115.2 kg)   SpO2 98%   BMI 36.45 kg/m   Wt Readings from Last 3 Encounters:  02/06/18 254 lb (115.2 kg)  01/04/18 250 lb 3.2 oz (113.5 kg)  12/27/17 251 lb 1.9 oz (113.9 kg)   Constitutional: overweight, in NAD Eyes: PERRLA, EOMI, no exophthalmos ENT: moist mucous  membranes, no thyromegaly, no cervical lymphadenopathy Cardiovascular: RRR, No MRG, + lower extremity edema Respiratory: CTA B Gastrointestinal: abdomen soft, NT, ND, BS+ Musculoskeletal: no deformities, strength intact in all 4 Skin: moist, warm, no rashes Neurological: no tremor with outstretched hands, DTR normal in all 4  ASSESSMENT: 1. DM2, insulin-dependent, uncontrolled, with complications - CAD, s/p NSTEMI, s/p stent 11/2014 - ED  2. Obesity class II BMI Classification:  < 18.5 underweight   18.5-24.9 normal weight   25.0-29.9 overweight   30.0-34.9 class I obesity   35.0-39.9 class II obesity   ? 40.0 class III obesity   3. HL   PLAN:  1. Patient with improved diabetes control in the last 2 years, after which initially he could come off long-acting insulin, but this was added back during the spring.  However, we are using a much lower dose compared to before.  At one point in the past, he was using up to 140 units of Levemir per day.  He is currently taking 16 units at bedtime.  We did discuss that he may come off the insulin again depending on his diet.  He had few hyperglycemic spikes at last visit after eating sweets.  Otherwise, the sugars were almost all at goal. - at this visit, sugars are higher at all times of the day.  Upon questioning, he did relaxes diet since last visit and is also eating at night.  He tells me that he falls asleep around 7 PM and wakes up 2 or 3 hours later and stays up until 4 in the morning afterwards.  During this time, he is eating during the night.  Strongly advised him to go to bed later and not eat at night. - We will also increase his Levemir at this visit but I advised him to back off again after his sugars improve, after he starts improving his diet - I suggested to:  Patient Instructions  Please continue: - Metformin 1000 mg 2x a day - Glipizide XL 5 mg 2x a day - break/crush the evening dose   Please increase: - Levemir to  20 units at bedtime  Please come back for a follow-up appointment in 4 months.  - today, HbA1c is 7.0% (higher) - continue checking sugars at different times of the day - check 1-2x a day, rotating checks - advised for yearly eye exams >> he is UTD - Return to clinic in 4 mo with sugar log    2. Obesity - weight is higher -Discussed about limiting snacks especially at night  3. HL - Reviewed latest lipid panel from 09/2016: LDL above goal - Continues pravastatin without side effects. - Need latest lipid panel from PCP  [name], MD PhD [facility] Endocrinology

## 2018-02-06 NOTE — Addendum Note (Signed)
Addended by: Cardell Peach I on: 02/06/2018 04:41 PM   Modules accepted: Orders

## 2018-02-06 NOTE — Patient Instructions (Addendum)
Please continue: - Metformin 1000 mg 2x a day - Glipizide XL 5 mg 2x a day - break/crush the evening dose   Please increase: - Levemir to 20 units at bedtime  Please come back for a follow-up appointment in 4 months.

## 2018-02-07 ENCOUNTER — Other Ambulatory Visit: Payer: Self-pay | Admitting: Urology

## 2018-02-07 NOTE — Telephone Encounter (Signed)
Most recent BP check was completed 02/06/18 at Dr Arman Filter office.  It was 148/80.

## 2018-02-08 NOTE — Procedures (Signed)
Patient Name: [name], [name] Date: 02/06/2018 Gender: Male D.O.B: 03-29-1951 Age (years): 10 Referring Provider: Fransico Him MD, ABSM Height (inches): 70 Interpreting Physician: Fransico Him MD, ABSM Weight (lbs): 250 RPSGT: Laren Everts BMI: 36 MRN: [id number] Neck Size: 17.50  CLINICAL INFORMATION  Sleep Study Type: Split Night CPAP  Indication for sleep study: Diabetes, Excessive Daytime Sleepiness, Fatigue, Obesity, OSA, Re-Evaluation, Snoring, Witnessed Apneas  Epworth Sleepiness Score: 15  SLEEP STUDY TECHNIQUE  As per the AASM Manual for the Scoring of Sleep and Associated Events v2.3 (April 2016) with a hypopnea requiring 4% desaturations. The channels recorded and monitored were frontal, central and occipital EEG, electrooculogram (EOG), submentalis EMG (chin), nasal and oral airflow, thoracic and abdominal wall motion, anterior tibialis EMG, snore microphone, electrocardiogram, and pulse oximetry. Continuous positive airway pressure (CPAP) was initiated when the patient met split night criteria and was titrated according to treat sleep-disordered breathing.  MEDICATIONS  Medications self-administered by patient taken the night of the study : N/A  RESPIRATORY PARAMETERS  Diagnostic Total AHI (/hr):58.1 RDI (/hr):59.0 OA Index (/hr):7.7 CA Index (/hr):4.8 REM AHI (/hr):76.0 NREM AHI (/hr):52.4 Supine AHI (/hr):58.1 Non-supine AHI (/hr):0 Min O2 Sat (%):77.0 Mean O2 (%):93.5 Time below 88% (min):12.6  Titration Optimal Pressure (cm):16 AHI at Optimal Pressure (/hr):36.8 Min O2 at Optimal Pressure (%):91.0 Supine % at Optimal (%):100 Sleep % at Optimal (%):95  SLEEP ARCHITECTURE  The recording time for the entire night was 460.9 minutes. During a baseline period of 133.0 minutes, the patient slept for 125.0 minutes in REM and nonREM, yielding a sleep efficiency of 94.0%%. Sleep onset after lights out was 6.7 minutes with a REM latency of 38.5  minutes. The patient spent 4.0%% of the night in stage N1 sleep, 72.0%% in stage N2 sleep, 0.0%% in stage N3 and 24% in REM.  During the titration period of 324.0 minutes, the patient slept for 316.0 minutes in REM and nonREM, yielding a sleep efficiency of 97.5%%. Sleep onset after CPAP initiation was 1.8 minutes with a REM latency of 62.5 minutes. The patient spent 4.7%% of the night in stage N1 sleep, 81.5%% in stage N2 sleep, 0.0%% in stage N3 and 13.8% in REM.  CARDIAC DATA  The 2 lead EKG demonstrated sinus rhythm. The mean heart rate was 100.0 beats per minute. Other EKG findings include: PVCs.  LEG MOVEMENT DATA  The total Periodic Limb Movements of Sleep (PLMS) were 0. The PLMS index was 0.0 .  IMPRESSIONS  - Severe obstructive sleep apnea occurred during the diagnostic portion of the study (AHI = 58.1/hour). An optimal PAP pressure could not be determined due to onging respiratory events.  - No significant central sleep apnea occurred during the diagnostic portion of the study (CAI = 4.8/hour). - Mild oxygen desaturation was noted during the diagnostic portion of the study (Min O2 = 77.0%). - The patient snored with moderate snoring volume during the diagnostic portion of the study. - EKG findings include PVCs. - Clinically significant periodic limb movements did not occur during sleep.  DIAGNOSIS  - Obstructive Sleep Apnea (327.23 [G47.33 ICD-10]) - Nocturnal Hypoxemia  RECOMMENDATIONS  -  BiPAP titration in lab. - Avoid alcohol, sedatives and other CNS depressants that may worsen sleep apnea and disrupt normal sleep architecture. - Sleep hygiene should be reviewed to assess factors that may improve sleep quality. - Weight management and regular exercise should be initiated or continued.  [Electronically signed] 02/08/2018 08:20 PM  Fransico Him MD, [place], American Board  of Sleep Medicine

## 2018-02-10 ENCOUNTER — Telehealth: Payer: Self-pay | Admitting: *Deleted

## 2018-02-10 DIAGNOSIS — Z9989 Dependence on other enabling machines and devices: Secondary | ICD-10-CM

## 2018-02-10 DIAGNOSIS — G4733 Obstructive sleep apnea (adult) (pediatric): Secondary | ICD-10-CM

## 2018-02-10 NOTE — Telephone Encounter (Signed)
Called results LMTCB 

## 2018-02-10 NOTE — Telephone Encounter (Signed)
-----   Message from Sueanne Margarita, MD sent at 02/08/2018  8:24 PM EST ----- Please let patient know that they have sleep apnea but unsuccessful CPAP titration due to severity of OSA and recommend BiPAP titration. Please set up titration in the sleep lab.

## 2018-02-13 NOTE — Telephone Encounter (Signed)
Called results lmtcb. 

## 2018-02-14 ENCOUNTER — Inpatient Hospital Stay (HOSPITAL_COMMUNITY): Admission: RE | Admit: 2018-02-14 | Payer: Federal, State, Local not specified - PPO | Source: Ambulatory Visit

## 2018-02-14 ENCOUNTER — Encounter: Payer: Self-pay | Admitting: Urology

## 2018-02-14 DIAGNOSIS — H43393 Other vitreous opacities, bilateral: Secondary | ICD-10-CM | POA: Diagnosis not present

## 2018-02-14 NOTE — Progress Notes (Signed)
Per inbox message from Dr. Alinda Money, patient will get fiducial markers in office at [place] on 02/20/18- his insurance denied [employer] so he will not need prostate MRI.  He is schedule for CT SIM on 02/23/18 in preparation to begin a 5.5 week course of prostate IMRT.

## 2018-02-17 NOTE — Telephone Encounter (Signed)
Patient's wife dropped off FMLA paper work.

## 2018-02-20 ENCOUNTER — Ambulatory Visit (HOSPITAL_COMMUNITY)
Admission: RE | Admit: 2018-02-20 | Payer: Federal, State, Local not specified - PPO | Source: Ambulatory Visit | Admitting: Urology

## 2018-02-20 ENCOUNTER — Encounter (HOSPITAL_COMMUNITY): Admission: RE | Payer: Self-pay | Source: Ambulatory Visit

## 2018-02-20 DIAGNOSIS — Z6835 Body mass index (BMI) 35.0-35.9, adult: Secondary | ICD-10-CM | POA: Diagnosis not present

## 2018-02-20 DIAGNOSIS — M170 Bilateral primary osteoarthritis of knee: Secondary | ICD-10-CM | POA: Diagnosis not present

## 2018-02-20 SURGERY — INSERTION, GOLD SEEDS
Anesthesia: General

## 2018-02-21 NOTE — Telephone Encounter (Signed)
Called results on home lmtcb.

## 2018-02-21 NOTE — Telephone Encounter (Addendum)
Informed patient of sleep study results and patient understanding was verbalized. Patient understands his sleep study showed they have sleep apnea but unsuccessful CPAP titration due to severity of OSA and recommend BiPAP titration. Please set up titration in the sleep lab.   Bipap tiration sent to precert

## 2018-02-22 ENCOUNTER — Telehealth: Payer: Self-pay | Admitting: *Deleted

## 2018-02-22 NOTE — Progress Notes (Signed)
FMLA for spouse, Moe Graca, mailed to patient address on file. No fax number provided to fax forms to.

## 2018-02-22 NOTE — Telephone Encounter (Signed)
CALLED PATIENT TO REMIND OF SIM APPT. FOR 02-23-18 @ 1 PM, LVM FOR A RETURN CALL

## 2018-02-23 ENCOUNTER — Encounter: Payer: Self-pay | Admitting: Medical Oncology

## 2018-02-23 ENCOUNTER — Ambulatory Visit
Admission: RE | Admit: 2018-02-23 | Discharge: 2018-02-23 | Disposition: A | Discharge status: Home / Self Care | Payer: Federal, State, Local not specified - PPO | Source: Ambulatory Visit | Attending: Radiation Oncology | Admitting: Radiation Oncology

## 2018-02-23 DIAGNOSIS — C61 Malignant neoplasm of prostate: Secondary | ICD-10-CM | POA: Diagnosis not present

## 2018-02-27 ENCOUNTER — Telehealth: Payer: Self-pay | Admitting: *Deleted

## 2018-02-27 NOTE — Telephone Encounter (Signed)
BIPAP PA submitted to HTA via web portal. 

## 2018-02-27 NOTE — Telephone Encounter (Signed)
-----   Message from Freada Bergeron, [place] sent at 02/24/2018  4:19 PM EST ----- Regarding: pre cert Bipap titration

## 2018-02-28 NOTE — Progress Notes (Signed)
  Radiation Oncology         [phone] ________________________________  Name: [name] MRN: [id number]  Date: 03/01/2018  DOB: Feb 11, 1951  SIMULATION AND TREATMENT PLANNING NOTE    ICD-10-CM   1. Malignant neoplasm of prostate ([place]) C61     DIAGNOSIS:  67 y.o. gentleman with Stage T1c adenocarcinoma of the prostate with Gleason Score of 3+4, and PSA of 5.5  NARRATIVE:  The patient was brought to the [facility].  Identity was confirmed.  All relevant records and images related to the planned course of therapy were reviewed.  The patient freely provided informed written consent to proceed with treatment after reviewing the details related to the planned course of therapy. The consent form was witnessed and verified by the simulation staff.  Then, the patient was set-up in a stable reproducible supine position for radiation therapy.  A vacuum lock pillow device was custom fabricated to position his legs in a reproducible immobilized position.  Then, I performed a urethrogram under sterile conditions to identify the prostatic apex.  CT images were obtained.  Surface markings were placed.  The CT images were loaded into the planning software.  Then the prostate target and avoidance structures including the rectum, bladder, bowel and hips were contoured.  Treatment planning then occurred.  The radiation prescription was entered and confirmed.  A total of one complex treatment devices was fabricated. I have requested : Intensity Modulated Radiotherapy (IMRT) is medically necessary for this case for the following reason:  Rectal sparing.  PLAN:  The patient will receive 70 Gy in 28 fractions.  ________________________________  Sheral Apley Tammi Klippel, M.D.  This document serves as a record of services personally performed by Tyler Pita, MD. It was created on his behalf by Wilburn Mylar, a trained medical scribe. The creation of this record is based on the scribe's  personal observations and the provider's statements to them. This document has been checked and approved by the attending provider.

## 2018-03-01 ENCOUNTER — Ambulatory Visit
Admission: RE | Admit: 2018-03-01 | Discharge: 2018-03-01 | Disposition: A | Discharge status: Home / Self Care | Payer: Federal, State, Local not specified - PPO | Source: Ambulatory Visit | Attending: Radiation Oncology | Admitting: Radiation Oncology

## 2018-03-01 DIAGNOSIS — C61 Malignant neoplasm of prostate: Secondary | ICD-10-CM

## 2018-03-06 ENCOUNTER — Telehealth: Payer: Self-pay | Admitting: *Deleted

## 2018-03-06 DIAGNOSIS — E119 Type 2 diabetes mellitus without complications: Secondary | ICD-10-CM | POA: Diagnosis not present

## 2018-03-06 DIAGNOSIS — Z7984 Long term (current) use of oral hypoglycemic drugs: Secondary | ICD-10-CM | POA: Diagnosis not present

## 2018-03-06 DIAGNOSIS — H524 Presbyopia: Secondary | ICD-10-CM | POA: Diagnosis not present

## 2018-03-06 DIAGNOSIS — H5203 Hypermetropia, bilateral: Secondary | ICD-10-CM | POA: Diagnosis not present

## 2018-03-06 DIAGNOSIS — H25093 Other age-related incipient cataract, bilateral: Secondary | ICD-10-CM | POA: Diagnosis not present

## 2018-03-06 NOTE — Telephone Encounter (Signed)
Staff message sent to [place]. HTA Auth received. Ok to schedule BIPAP titration. Auth # [id number]. Valid 02/27/18 to 05/28/18.

## 2018-03-06 NOTE — Telephone Encounter (Signed)
-----   Message from Freada Bergeron, [place] sent at 02/24/2018  4:19 PM EST ----- Regarding: pre cert Bipap titration

## 2018-03-07 ENCOUNTER — Ambulatory Visit: Payer: Federal, State, Local not specified - PPO | Admitting: Interventional Cardiology

## 2018-03-10 DIAGNOSIS — C61 Malignant neoplasm of prostate: Secondary | ICD-10-CM | POA: Insufficient documentation

## 2018-03-15 ENCOUNTER — Encounter: Payer: Self-pay | Admitting: Medical Oncology

## 2018-03-15 ENCOUNTER — Ambulatory Visit
Admission: RE | Admit: 2018-03-15 | Discharge: 2018-03-15 | Disposition: A | Discharge status: Home / Self Care | Payer: Federal, State, Local not specified - PPO | Source: Ambulatory Visit | Attending: Radiation Oncology | Admitting: Radiation Oncology

## 2018-03-15 DIAGNOSIS — C61 Malignant neoplasm of prostate: Secondary | ICD-10-CM | POA: Diagnosis not present

## 2018-03-15 NOTE — Telephone Encounter (Signed)
Hello Dr Radford Pax, the patient states he already has a Bipap machine that he got from advanced from a previous study a while ago. Patient does not wish to have the Bipap Titration done. He states he has a bill with your name on it and he has never met you before. His question was why am I getting a bill for a doctor I have never seen? I explained to him that you were the doctor that read his study and will be the doctor that follows his sleep therapy . The patient has declined to test and wants to forget everything going forward.

## 2018-03-15 NOTE — Telephone Encounter (Signed)
RE: pre cert  Received: 1 week ago  Message Contents  Lauralee Evener, CMA  Freada Bergeron, CMA        HTA Auth received. Ok to schedule BIPAP titration. Auth # [id number]. Valid 02/27/18 to 05/28/18.

## 2018-03-16 ENCOUNTER — Ambulatory Visit
Admission: RE | Admit: 2018-03-16 | Discharge: 2018-03-16 | Disposition: A | Discharge status: Home / Self Care | Payer: Federal, State, Local not specified - PPO | Source: Ambulatory Visit | Attending: Radiation Oncology | Admitting: Radiation Oncology

## 2018-03-16 DIAGNOSIS — C61 Malignant neoplasm of prostate: Secondary | ICD-10-CM | POA: Diagnosis not present

## 2018-03-17 ENCOUNTER — Ambulatory Visit
Admission: RE | Admit: 2018-03-17 | Discharge: 2018-03-17 | Disposition: A | Discharge status: Home / Self Care | Payer: Federal, State, Local not specified - PPO | Source: Ambulatory Visit | Attending: Radiation Oncology | Admitting: Radiation Oncology

## 2018-03-17 DIAGNOSIS — C61 Malignant neoplasm of prostate: Secondary | ICD-10-CM | POA: Diagnosis not present

## 2018-03-20 ENCOUNTER — Ambulatory Visit
Admission: RE | Admit: 2018-03-20 | Discharge: 2018-03-20 | Disposition: A | Discharge status: Home / Self Care | Payer: Federal, State, Local not specified - PPO | Source: Ambulatory Visit | Attending: Radiation Oncology | Admitting: Radiation Oncology

## 2018-03-20 DIAGNOSIS — C61 Malignant neoplasm of prostate: Secondary | ICD-10-CM | POA: Diagnosis not present

## 2018-03-20 DIAGNOSIS — M170 Bilateral primary osteoarthritis of knee: Secondary | ICD-10-CM | POA: Diagnosis not present

## 2018-03-21 ENCOUNTER — Encounter: Payer: Self-pay | Admitting: Medical Oncology

## 2018-03-21 ENCOUNTER — Ambulatory Visit
Admission: RE | Admit: 2018-03-21 | Discharge: 2018-03-21 | Disposition: A | Discharge status: Home / Self Care | Payer: Federal, State, Local not specified - PPO | Source: Ambulatory Visit | Attending: Radiation Oncology | Admitting: Radiation Oncology

## 2018-03-21 DIAGNOSIS — C61 Malignant neoplasm of prostate: Secondary | ICD-10-CM | POA: Diagnosis not present

## 2018-03-21 NOTE — Progress Notes (Signed)
Patient called stating he has decided to move forward with surgery. Per Dr. Johny Shears and Dr. Lynne Logan notes, he is not a good surgical candidate. I asked Dr. Gwen Pounds to please call [name] to discuss further.

## 2018-03-22 ENCOUNTER — Ambulatory Visit
Admission: RE | Admit: 2018-03-22 | Discharge: 2018-03-22 | Disposition: A | Discharge status: Home / Self Care | Payer: Federal, State, Local not specified - PPO | Source: Ambulatory Visit | Attending: Radiation Oncology | Admitting: Radiation Oncology

## 2018-03-22 DIAGNOSIS — C61 Malignant neoplasm of prostate: Secondary | ICD-10-CM | POA: Diagnosis not present

## 2018-03-23 ENCOUNTER — Ambulatory Visit
Admission: RE | Admit: 2018-03-23 | Discharge: 2018-03-23 | Disposition: A | Discharge status: Home / Self Care | Payer: Federal, State, Local not specified - PPO | Source: Ambulatory Visit | Attending: Radiation Oncology | Admitting: Radiation Oncology

## 2018-03-23 DIAGNOSIS — C61 Malignant neoplasm of prostate: Secondary | ICD-10-CM | POA: Diagnosis not present

## 2018-03-24 ENCOUNTER — Ambulatory Visit
Admission: RE | Admit: 2018-03-24 | Discharge: 2018-03-24 | Disposition: A | Discharge status: Home / Self Care | Payer: Federal, State, Local not specified - PPO | Source: Ambulatory Visit | Attending: Radiation Oncology | Admitting: Radiation Oncology

## 2018-03-24 ENCOUNTER — Telehealth: Payer: Self-pay

## 2018-03-24 DIAGNOSIS — C61 Malignant neoplasm of prostate: Secondary | ICD-10-CM | POA: Diagnosis not present

## 2018-03-24 NOTE — Telephone Encounter (Signed)
Patient walk-in about his BP. Patient stated he was at his radiology oncology appt and his BP was 190/90. Patient stated his HR was good. Patient stated he needs his BP lowered. Informed patient that we do not take walk-ins and offered patient an appointment for Monday with a NP. Asked patient if he had call his PCP. Patient stated yes, but he was unable to get in touch with them. Patient stated he wanted Dr. Tamala Julian to advise and would like him to send him in something to help lower his BP. Asked patient if he checks his BP at home. Patient stated his BP machine at home is not accurate. Informed patient if he is having symptoms along with his BP being elevated to go to ED or urgent care. Patient denied any symptoms at this time. Patient stated he might go to urgent care. Asked patient about his diet and salt intake. Patient stated his diet has not changed in the last two years. Informed patient that a message would be sent to Dr. Tamala Julian for advisement.

## 2018-03-24 NOTE — Telephone Encounter (Signed)
Called patient about Dr. Thompson Caul recommendation. Patient refused to see an NP/PA and stated he would wait to see Dr. Tamala Julian in January. Patient stated he will bring his BP device with him when he comes.

## 2018-03-24 NOTE — Telephone Encounter (Signed)
Left message on home phone to call back.  Attempted cell phone but no answer and VM has not been set up.

## 2018-03-24 NOTE — Telephone Encounter (Signed)
He needs PCP or [facility] appointment. He should measure BP at home and bring device to clinic.

## 2018-03-27 ENCOUNTER — Ambulatory Visit
Admission: RE | Admit: 2018-03-27 | Discharge: 2018-03-27 | Disposition: A | Discharge status: Home / Self Care | Payer: Federal, State, Local not specified - PPO | Source: Ambulatory Visit | Attending: Radiation Oncology | Admitting: Radiation Oncology

## 2018-03-27 DIAGNOSIS — C61 Malignant neoplasm of prostate: Secondary | ICD-10-CM | POA: Diagnosis not present

## 2018-03-28 ENCOUNTER — Ambulatory Visit
Admission: RE | Admit: 2018-03-28 | Discharge: 2018-03-28 | Disposition: A | Discharge status: Home / Self Care | Payer: Federal, State, Local not specified - PPO | Source: Ambulatory Visit | Attending: Radiation Oncology | Admitting: Radiation Oncology

## 2018-03-28 DIAGNOSIS — C61 Malignant neoplasm of prostate: Secondary | ICD-10-CM | POA: Diagnosis not present

## 2018-03-30 ENCOUNTER — Ambulatory Visit
Admission: RE | Admit: 2018-03-30 | Discharge: 2018-03-30 | Disposition: A | Discharge status: Home / Self Care | Payer: Federal, State, Local not specified - PPO | Source: Ambulatory Visit | Attending: Radiation Oncology | Admitting: Radiation Oncology

## 2018-03-30 DIAGNOSIS — C61 Malignant neoplasm of prostate: Secondary | ICD-10-CM | POA: Diagnosis not present

## 2018-03-31 ENCOUNTER — Ambulatory Visit (INDEPENDENT_AMBULATORY_CARE_PROVIDER_SITE_OTHER): Payer: Federal, State, Local not specified - PPO | Admitting: Family Medicine

## 2018-03-31 ENCOUNTER — Ambulatory Visit
Admission: RE | Admit: 2018-03-31 | Discharge: 2018-03-31 | Disposition: A | Discharge status: Home / Self Care | Payer: Federal, State, Local not specified - PPO | Source: Ambulatory Visit | Attending: Radiation Oncology | Admitting: Radiation Oncology

## 2018-03-31 ENCOUNTER — Encounter: Payer: Self-pay | Admitting: Family Medicine

## 2018-03-31 VITALS — BP 166/96 | HR 65 | Temp 97.9°F | Ht 69.0 in | Wt 254.2 lb

## 2018-03-31 DIAGNOSIS — Z23 Encounter for immunization: Secondary | ICD-10-CM | POA: Diagnosis not present

## 2018-03-31 DIAGNOSIS — I25119 Atherosclerotic heart disease of native coronary artery with unspecified angina pectoris: Secondary | ICD-10-CM | POA: Diagnosis not present

## 2018-03-31 DIAGNOSIS — E1159 Type 2 diabetes mellitus with other circulatory complications: Secondary | ICD-10-CM | POA: Diagnosis not present

## 2018-03-31 DIAGNOSIS — C61 Malignant neoplasm of prostate: Secondary | ICD-10-CM | POA: Diagnosis not present

## 2018-03-31 DIAGNOSIS — I10 Essential (primary) hypertension: Secondary | ICD-10-CM

## 2018-03-31 NOTE — Progress Notes (Signed)
[name] is a 67 y.o. male  Chief Complaint  Patient presents with  . New Patient (Initial Visit)    Patient is here today to establish care. Being Tx for Prostate Cancer by Dr. Tammi Klippel at [facility] and is on Radiation Tx..  Dr. Cruzita Lederer in Endocrinology Tx his DM.  Marland Kitchen Hypertension    He is wanting to discuss his HTN.  He states that it has gotten worse.  He is taking Losartan 100mg  1qd and HCTZ 25mg  1qd (cannot get Hyzaar combination so split them) and Metoprolol 25mg  1bid. He takes his BP at home but it does not seem to be getting better.  He has a F/U sched with [facility] 1.8.20 after having stents placed.  The other tday his BP was 191/100.  He denies any symptoms of CP, SOB, neck or jaw or arm pain.    HPI: [name] is a 67 y.o. male here to establish care with our office. He is currently following with urology for prostate cancer and is undergoing radiation tx. He also follows with endocrinology for DM and cardio for HTN, CAD, s/p stent.  He is concerned about his BP. He is on lopressor 25mg  BID, HCTZ 25mg  daily, losartan 100mg  daily, and hydralazine 25mg  daily (instructions say to take 1 tab BID). He states he was on amlodipine in the past and cannot recall why it was stopped.  He notes his BP was previously better controlled on this regimen. He is less active but has not gained weight.  He has cardio f/u scheduled on 04/12/18.  He would like a flu vaccine today.  Past Medical History:  Diagnosis Date  . Adenomatous colon polyp 2011  . Anemia   . Angina pectoris ([place]) 05/01/2015   med rx for 95% OM (u/a to access due to tortuosity), 95% D1 and other moderate CAD at cath  . Arthritis    "back, knees" (05/01/2015)  . Chest pain    pleuritic  . Chronic lower back pain   . Coronary artery disease   . Depression   . GERD (gastroesophageal reflux disease)   . HTN (hypertension)   . Hyperlipidemia   . Obstructive sleep apnea    noncompliant with CPAP  .  Osteoarthritis   . Pneumonia ~ 2014 X 1  . Prostate cancer ([place])   . Refusal of blood transfusions as patient is Jehovah's Witness   . Type II or unspecified type diabetes mellitus without mention of complication, not stated as uncontrolled     Past Surgical History:  Procedure Laterality Date  . CARDIAC CATHETERIZATION  2004   patent coronary arteries  . CARDIAC CATHETERIZATION  05/01/2015   "tried to put stent in but couldn't"  . CARDIAC CATHETERIZATION N/A 05/01/2015   Procedure: Left Heart Cath and Coronary Angiography;  Surgeon: Belva Crome, MD; LAD 60%, oD1 90%, pD1 70%, D2 70%, CFX patent stent, 30% distal to prev stent, pRCA 20%, OM1 90/95%, NL LV  . CARDIAC CATHETERIZATION N/A 05/01/2015   Procedure: Coronary Stent Intervention;  Surgeon: Belva Crome, MD;  Unsuccessful PCI OM due to tortuosity  . CARDIAC CATHETERIZATION N/A 11/06/2014   Procedure: Left Heart Cath and Coronary Angiography;  Surgeon: Belva Crome, MD;  Location: [facility];  Service: Cardiovascular;  Laterality: N/A;  . CLOSED REDUCTION SHOULDER DISLOCATION Right ~ 1975   "& reattached muscle"  . COLONOSCOPY W/ BIOPSIES AND POLYPECTOMY  X 2  . ESOPHAGOGASTRODUODENOSCOPY ENDOSCOPY    .  PILONIDAL CYST EXCISION      Social History   Socioeconomic History  . Marital status: Married    Spouse name: [name]  . Number of children: 4  . Years of education: Not on file  . Highest education level: Not on file  Occupational History  . Occupation: Retired    Fish farm manager: UNEMPLOYED  Social Needs  . Financial resource strain: Not on file  . Food insecurity:    Worry: Not on file    Inability: Not on file  . Transportation needs:    Medical: Not on file    Non-medical: Not on file  Tobacco Use  . Smoking status: Former Smoker    Packs/day: 0.50    Years: 10.00    Pack years: 5.00    Types: Cigarettes    Last attempt to quit: 04/06/1983    Years since quitting: 35.0  . Smokeless tobacco: Never Used    Substance and Sexual Activity  . Alcohol use: Yes    Alcohol/week: 0.0 standard drinks    Comment: `/26/2017 "might drink a beer q couple months mostly; summertime I might drink 2-3 beers/week"  . Drug use: No  . Sexual activity: Not Currently  Lifestyle  . Physical activity:    Days per week: Not on file    Minutes per session: Not on file  . Stress: Not on file  Relationships  . Social connections:    Talks on phone: Not on file    Gets together: Not on file    Attends religious service: Not on file    Active member of club or organization: Not on file    Attends meetings of clubs or organizations: Not on file    Relationship status: Not on file  . Intimate partner violence:    Fear of current or ex partner: Not on file    Emotionally abused: Not on file    Physically abused: Not on file    Forced sexual activity: Not on file  Other Topics Concern  . Not on file  Social History Narrative   Lives in [place] with spouse [name]).  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    Family History  Problem Relation Age of Onset  . Coronary artery disease Other        CABG  . Coronary artery disease Mother   . Stroke Mother   . Heart disease Mother   . Diabetes Mother   . Other Father   . Diabetes Sister   . Diabetes Maternal Uncle        x2  . Alcohol abuse Other   . Diabetes Paternal Uncle        x2  . Colon cancer Paternal Uncle   . Colon polyps Neg Hx   . Esophageal cancer Neg Hx      Immunization History  Administered Date(s) Administered  . Influenza Whole 02/04/2011  . Influenza,inj,Quad PF,6+ Mos 12/18/2012, 01/24/2014  . Pneumococcal Polysaccharide-23 08/23/2008  . Td 08/23/2008    Outpatient Encounter Medications as of 03/31/2018  Medication Sig  . Blood Glucose Monitoring Suppl (RELION PREMIER BLU MONITOR) DEVI 1 Device by Does not apply route daily.  Marland Kitchen esomeprazole (NEXIUM) 40 MG capsule Take  40 mg by mouth daily as needed (heartburn or acid reflux).  . fluticasone (FLONASE) 50 MCG/ACT nasal spray Place 1 spray into both nostrils daily as needed.  Marland Kitchen glipiZIDE (GLUCOTROL XL) 5 MG 24  hr tablet TAKE 1 TABLET BY MOUTH TWICE A DAY  . glucose blood test strip Use as instructed to check sugar 2 times daily  . hydrALAZINE (APRESOLINE) 25 MG tablet TAKE 1 TABLET BY MOUTH TWICE A DAY  . hydrochlorothiazide (HYDRODIURIL) 25 MG tablet Take 1 tablet (25 mg total) by mouth daily.  . Insulin Detemir (LEVEMIR FLEXTOUCH) 100 UNIT/ML Pen Inject 20 Units into the skin daily at 10 pm.  . Insulin Pen Needle (NOVOFINE PLUS) 32G X 4 MM MISC Use 1x a day  . latanoprost (XALATAN) 0.005 % ophthalmic solution Place 1 drop into both eyes at bedtime.  Marland Kitchen losartan (COZAAR) 100 MG tablet Take 1 tablet (100 mg total) by mouth daily.  . metFORMIN (GLUCOPHAGE) 500 MG tablet TAKE 2 TABLETS (1,000 MG TOTAL) BY MOUTH 2 (TWO) TIMES DAILY WITH A MEAL.  . metoprolol tartrate (LOPRESSOR) 25 MG tablet TAKE 1 TABLET(25 MG) BY MOUTH IN AM AND TAKE 1/2 TABLET(12.5 MG) BY MOUTH IN PM (Patient taking differently: Take 25 mg by mouth 2 (two) times daily. )  . nitroGLYCERIN (NITROSTAT) 0.4 MG SL tablet PLACE ONE TABLET UNDER THE TONGUE EVERY 5 MINUTES X3 DOSES AS NEEDED FOR CHEST PAIN  . pravastatin (PRAVACHOL) 40 MG tablet Take 40 mg by mouth daily.  . traZODone (DESYREL) 50 MG tablet Take 1 tablet (50 mg total) by mouth at bedtime as needed for sleep.  . [DISCONTINUED] losartan-hydrochlorothiazide (HYZAAR) 100-25 MG tablet TAKE 1 TABLET BY MOUTH EVERY DAY   No facility-administered encounter medications on file as of 03/31/2018.      ROS: Pertinent positives and negatives noted in HPI. Remainder of ROS non-contributory   Allergies  Allergen Reactions  . Statins Other (See Comments)    REACTION: joint pain Lipitor- headaches Has also tried Livalo, pravachol, zetia, crestor, welchol  . Imdur [Isosorbide Dinitrate]      Headache - "violent"    BP (!) 166/96 (BP Location: Left Arm, Cuff Size: Large)   Pulse 65   Temp 97.9 F (36.6 C) (Oral)   Ht 5\' 9"  (1.753 m)   Wt 254 lb 3.2 oz (115.3 kg)   SpO2 96%   BMI 37.54 kg/m   Physical Exam  Constitutional: He is oriented to person, place, and time. He appears well-developed and well-nourished.  HENT:  Head: Normocephalic and atraumatic.  Neck: Neck supple.  Cardiovascular: Normal rate, regular rhythm and intact distal pulses.  Pulmonary/Chest: Effort normal and breath sounds normal. No respiratory distress.  Abdominal: Soft. Bowel sounds are normal. He exhibits no distension. There is no abdominal tenderness.  Musculoskeletal: Normal range of motion.        General: No edema.  Lymphadenopathy:    He has no cervical adenopathy.  Neurological: He is alert and oriented to person, place, and time.  Skin: Skin is warm and dry.  Psychiatric: He has a normal mood and affect.     A/P:  1. Need for influenza vaccination - Flu vaccine HIGH DOSE PF (Fluzone High dose)  2. Essential hypertension - not at goal - increase hydralazine to 25mg  BID from daily, cont all other meds as currently taking - low sodium diet, encouraged regular CV exercise - f/u with cardio as scheduled on 04/12/18  3. Coronary artery disease involving native coronary artery of native heart with angina pectoris ([place]) - stable - cont current meds and f/u as scheduled with cardio on 04/12/18  4. Type 2 diabetes mellitus with other circulatory complication, without long-term current use of  insulin ([place]) - last A1C = 7.0 - cont current meds and f/u as scheduled with endocrinology  5. Malignant neoplasm of prostate ([place]) - cont current treatment plan and f/u with urology as planned  Next OV with me in 6 mo or PRN

## 2018-03-31 NOTE — Patient Instructions (Addendum)
Continue lopressor 25mg  twice per day Continue HCTZ 25mg  daily Continue losartan 100mg  daily Take hydralazine 25mg  twice per day - not once

## 2018-04-03 ENCOUNTER — Ambulatory Visit
Admission: RE | Admit: 2018-04-03 | Discharge: 2018-04-03 | Disposition: A | Discharge status: Home / Self Care | Payer: Federal, State, Local not specified - PPO | Source: Ambulatory Visit | Attending: Radiation Oncology | Admitting: Radiation Oncology

## 2018-04-03 DIAGNOSIS — C61 Malignant neoplasm of prostate: Secondary | ICD-10-CM | POA: Diagnosis not present

## 2018-04-04 ENCOUNTER — Ambulatory Visit
Admission: RE | Admit: 2018-04-04 | Discharge: 2018-04-04 | Disposition: A | Discharge status: Home / Self Care | Payer: Federal, State, Local not specified - PPO | Source: Ambulatory Visit | Attending: Radiation Oncology | Admitting: Radiation Oncology

## 2018-04-04 DIAGNOSIS — C61 Malignant neoplasm of prostate: Secondary | ICD-10-CM | POA: Diagnosis not present

## 2018-04-06 ENCOUNTER — Ambulatory Visit
Admission: RE | Admit: 2018-04-06 | Discharge: 2018-04-06 | Disposition: A | Discharge status: Home / Self Care | Payer: Federal, State, Local not specified - PPO | Source: Ambulatory Visit | Attending: Radiation Oncology | Admitting: Radiation Oncology

## 2018-04-06 DIAGNOSIS — C61 Malignant neoplasm of prostate: Secondary | ICD-10-CM | POA: Diagnosis not present

## 2018-04-07 ENCOUNTER — Ambulatory Visit
Admission: RE | Admit: 2018-04-07 | Discharge: 2018-04-07 | Disposition: A | Discharge status: Home / Self Care | Payer: Federal, State, Local not specified - PPO | Source: Ambulatory Visit | Attending: Radiation Oncology | Admitting: Radiation Oncology

## 2018-04-07 DIAGNOSIS — C61 Malignant neoplasm of prostate: Secondary | ICD-10-CM | POA: Diagnosis not present

## 2018-04-10 ENCOUNTER — Ambulatory Visit
Admission: RE | Admit: 2018-04-10 | Discharge: 2018-04-10 | Disposition: A | Discharge status: Home / Self Care | Payer: Federal, State, Local not specified - PPO | Source: Ambulatory Visit | Attending: Radiation Oncology | Admitting: Radiation Oncology

## 2018-04-10 DIAGNOSIS — C61 Malignant neoplasm of prostate: Secondary | ICD-10-CM | POA: Diagnosis not present

## 2018-04-11 ENCOUNTER — Ambulatory Visit
Admission: RE | Admit: 2018-04-11 | Discharge: 2018-04-11 | Disposition: A | Discharge status: Home / Self Care | Payer: Federal, State, Local not specified - PPO | Source: Ambulatory Visit | Attending: Radiation Oncology | Admitting: Radiation Oncology

## 2018-04-11 DIAGNOSIS — C61 Malignant neoplasm of prostate: Secondary | ICD-10-CM | POA: Diagnosis not present

## 2018-04-12 ENCOUNTER — Ambulatory Visit
Admission: RE | Admit: 2018-04-12 | Discharge: 2018-04-12 | Disposition: A | Discharge status: Home / Self Care | Payer: Federal, State, Local not specified - PPO | Source: Ambulatory Visit | Attending: Radiation Oncology | Admitting: Radiation Oncology

## 2018-04-12 ENCOUNTER — Ambulatory Visit: Payer: Federal, State, Local not specified - PPO | Admitting: Interventional Cardiology

## 2018-04-12 DIAGNOSIS — C61 Malignant neoplasm of prostate: Secondary | ICD-10-CM | POA: Diagnosis not present

## 2018-04-12 NOTE — Progress Notes (Deleted)
Cardiology Office Note:    Date:  04/12/2018   ID:  Brock, Ronald Aug 21, 1950, MRN [id number]  PCP:  Ronnald Nian, DO  Cardiologist:  [name], MD   Referring MD: [name], MD   No chief complaint on file.   History of Present Illness:    [name] is a 68 y.o. male with a hx of CAD s/p DES to LCx 11/2014, OM1 not stented due to inability of stent to track (likely due to wire tangle), HTN, HLD, OSA not on CAPA and PAT/PAF (not on anticoagulation due to low burden) who resented for evaluation of dyspnea on exertion.     Past Medical History:  Diagnosis Date  . Adenomatous colon polyp 2011  . Anemia   . Angina pectoris ([place]) 05/01/2015   med rx for 95% OM (u/a to access due to tortuosity), 95% D1 and other moderate CAD at cath  . Arthritis    "back, knees" (05/01/2015)  . Chest pain    pleuritic  . Chronic lower back pain   . Coronary artery disease   . Depression   . GERD (gastroesophageal reflux disease)   . HTN (hypertension)   . Hyperlipidemia   . Obstructive sleep apnea    noncompliant with CPAP  . Osteoarthritis   . Pneumonia ~ 2014 X 1  . Prostate cancer ([place])   . Refusal of blood transfusions as patient is Jehovah's Witness   . Type II or unspecified type diabetes mellitus without mention of complication, not stated as uncontrolled     Past Surgical History:  Procedure Laterality Date  . CARDIAC CATHETERIZATION  2004   patent coronary arteries  . CARDIAC CATHETERIZATION  05/01/2015   "tried to put stent in but couldn't"  . CARDIAC CATHETERIZATION N/A 05/01/2015   Procedure: Left Heart Cath and Coronary Angiography;  Surgeon: Belva Crome, MD; LAD 60%, oD1 90%, pD1 70%, D2 70%, CFX patent stent, 30% distal to prev stent, pRCA 20%, OM1 90/95%, NL LV  . CARDIAC CATHETERIZATION N/A 05/01/2015   Procedure: Coronary Stent Intervention;  Surgeon: Belva Crome, MD;  Unsuccessful PCI OM due to tortuosity  . CARDIAC CATHETERIZATION N/A  11/06/2014   Procedure: Left Heart Cath and Coronary Angiography;  Surgeon: Belva Crome, MD;  Location: [facility];  Service: Cardiovascular;  Laterality: N/A;  . CLOSED REDUCTION SHOULDER DISLOCATION Right ~ 1975   "& reattached muscle"  . COLONOSCOPY W/ BIOPSIES AND POLYPECTOMY  X 2  . ESOPHAGOGASTRODUODENOSCOPY ENDOSCOPY    . PILONIDAL CYST EXCISION      Current Medications: No outpatient medications have been marked as taking for the 04/12/18 encounter (Appointment) with Belva Crome, MD.     Allergies:   Statins and Imdur [isosorbide dinitrate]   Social History   Socioeconomic History  . Marital status: Married    Spouse name: [name]  . Number of children: 4  . Years of education: Not on file  . Highest education level: Not on file  Occupational History  . Occupation: Retired    Fish farm manager: UNEMPLOYED  Social Needs  . Financial resource strain: Not on file  . Food insecurity:    Worry: Not on file    Inability: Not on file  . Transportation needs:    Medical: Not on file    Non-medical: Not on file  Tobacco Use  . Smoking status: Former Smoker    Packs/day: 0.50    Years: 10.00  Pack years: 5.00    Types: Cigarettes    Last attempt to quit: 04/06/1983    Years since quitting: 35.0  . Smokeless tobacco: Never Used  Substance and Sexual Activity  . Alcohol use: Yes    Alcohol/week: 0.0 standard drinks    Comment: `/26/2017 "might drink a beer q couple months mostly; summertime I might drink 2-3 beers/week"  . Drug use: No  . Sexual activity: Not Currently  Lifestyle  . Physical activity:    Days per week: Not on file    Minutes per session: Not on file  . Stress: Not on file  Relationships  . Social connections:    Talks on phone: Not on file    Gets together: Not on file    Attends religious service: Not on file    Active member of club or organization: Not on file    Attends meetings of clubs or organizations: Not on file    Relationship  status: Not on file  Other Topics Concern  . Not on file  Social History Narrative   Lives in [place] with spouse Gottlieb Zuercher).  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     Family History: The patient's family history includes Alcohol abuse in an other family member; Colon cancer in his paternal uncle; Coronary artery disease in his mother and another family member; Diabetes in his maternal uncle, mother, paternal uncle, and sister; Heart disease in his mother; Other in his father; Stroke in his mother. There is no history of Colon polyps or Esophageal cancer.  ROS:   Please see the history of present illness.    *** All other systems reviewed and are negative.  EKGs/Labs/Other Studies Reviewed:    The following studies were reviewed today: ***  EKG:  EKG is ***  Recent Labs: 05/18/2017: BUN 8; Creatinine, Ser 0.88; Hemoglobin 13.8; Platelets 216; Potassium 3.9; Sodium 138  Recent Lipid Panel    Component Value Date/Time   CHOL 164 06/19/2015 1624   TRIG 61.0 06/19/2015 1624   HDL 38.40 (L) 06/19/2015 1624   CHOLHDL 4 06/19/2015 1624   VLDL 12.2 06/19/2015 1624   LDLCALC 114 (H) 06/19/2015 1624   LDLDIRECT 117 (H) 01/17/2018 1456   LDLDIRECT 168.0 12/18/2012 1340    Physical Exam:    VS:  There were no vitals taken for this visit.    Wt Readings from Last 3 Encounters:  03/31/18 254 lb 3.2 oz (115.3 kg)  02/06/18 250 lb (113.4 kg)  02/06/18 254 lb (115.2 kg)     GEN: ***. No acute distress HEENT: Normal NECK: No JVD. LYMPHATICS: No lymphadenopathy CARDIAC: ***RRR.  *** murmur, gallop, edema VASCULAR: Pulses ***, Bruits *** RESPIRATORY:  Clear to auscultation without rales, wheezing or rhonchi  ABDOMEN: Soft, non-tender, non-distended, No pulsatile mass, MUSCULOSKELETAL: No deformity  SKIN: Warm and dry NEUROLOGIC:  Alert and oriented x 3 PSYCHIATRIC:  Normal affect   ASSESSMENT:    1. Coronary artery  disease involving coronary bypass graft of native heart with angina pectoris ([place])   2. Hyperlipidemia LDL goal <70   3. Hypertension, unspecified type   4. Obstructive sleep apnea   5. Type 2 diabetes mellitus with other circulatory complication, with long-term current use of insulin ([place])   6. PAF (paroxysmal atrial fibrillation) ([facility])    PLAN:    In order of problems listed above:  1. ***   Medication Adjustments/Labs and Tests  Ordered: Current medicines are reviewed at length with the patient today.  Concerns regarding medicines are outlined above.  No orders of the defined types were placed in this encounter.  No orders of the defined types were placed in this encounter.   There are no Patient Instructions on file for this visit.   Signed, [name], MD  04/12/2018 1:24 PM    [place]

## 2018-04-13 ENCOUNTER — Ambulatory Visit
Admission: RE | Admit: 2018-04-13 | Discharge: 2018-04-13 | Disposition: A | Discharge status: Home / Self Care | Payer: Federal, State, Local not specified - PPO | Source: Ambulatory Visit | Attending: Radiation Oncology | Admitting: Radiation Oncology

## 2018-04-13 DIAGNOSIS — C61 Malignant neoplasm of prostate: Secondary | ICD-10-CM | POA: Diagnosis not present

## 2018-04-14 ENCOUNTER — Ambulatory Visit
Admission: RE | Admit: 2018-04-14 | Discharge: 2018-04-14 | Disposition: A | Discharge status: Home / Self Care | Payer: Federal, State, Local not specified - PPO | Source: Ambulatory Visit | Attending: Radiation Oncology | Admitting: Radiation Oncology

## 2018-04-14 DIAGNOSIS — C61 Malignant neoplasm of prostate: Secondary | ICD-10-CM | POA: Diagnosis not present

## 2018-04-17 ENCOUNTER — Ambulatory Visit: Payer: Federal, State, Local not specified - PPO

## 2018-04-17 NOTE — Progress Notes (Signed)
Cardiology Office Note:    Date:  04/18/2018   ID:  [name], DOB 03/05/1951, MRN [id number]  PCP:  Louie Casa, DO  Cardiologist:  [name], MD   Referring MD: [name], MD   Chief Complaint  Patient presents with  . Coronary Artery Disease  . Hypertension    History of Present Illness:    [name] is a 68 y.o. male with a hx of CAD s/p DES to LCx 11/2014, OM1 not stented due to tortuosity, HTN, HLD, OSA not on C-PAP and PAT/PAF (not on anticoagulation due to low burden) who resented for evaluation of dyspnea on exertion.   Recent diagnosis of prostate cancer and now receiving radiation.  Concerned about his blood pressure.  Advocates compliance with current therapy.  He has not had tachycardia, palpitations, or syncope.  He denies anginal quality chest pain.  This continue for quite some time after his initial PCI several years ago.  He had one lesion that we were unable to approach due to angulation.  Past Medical History:  Diagnosis Date  . Adenomatous colon polyp 2011  . Anemia   . Angina pectoris ([place]) 05/01/2015   med rx for 95% OM (u/a to access due to tortuosity), 95% D1 and other moderate CAD at cath  . Arthritis    "back, knees" (05/01/2015)  . Chest pain    pleuritic  . Chronic lower back pain   . Coronary artery disease   . Depression   . GERD (gastroesophageal reflux disease)   . HTN (hypertension)   . Hyperlipidemia   . Obstructive sleep apnea    noncompliant with CPAP  . Osteoarthritis   . Pneumonia ~ 2014 X 1  . Prostate cancer ([place])   . Refusal of blood transfusions as patient is Jehovah's Witness   . Type II or unspecified type diabetes mellitus without mention of complication, not stated as uncontrolled     Past Surgical History:  Procedure Laterality Date  . CARDIAC CATHETERIZATION  2004   patent coronary arteries  . CARDIAC CATHETERIZATION  05/01/2015   "tried to put stent in but couldn't"  . CARDIAC  CATHETERIZATION N/A 05/01/2015   Procedure: Left Heart Cath and Coronary Angiography;  Surgeon: Belva Crome, MD; LAD 60%, oD1 90%, pD1 70%, D2 70%, CFX patent stent, 30% distal to prev stent, pRCA 20%, OM1 90/95%, NL LV  . CARDIAC CATHETERIZATION N/A 05/01/2015   Procedure: Coronary Stent Intervention;  Surgeon: Belva Crome, MD;  Unsuccessful PCI OM due to tortuosity  . CARDIAC CATHETERIZATION N/A 11/06/2014   Procedure: Left Heart Cath and Coronary Angiography;  Surgeon: Belva Crome, MD;  Location: [facility];  Service: Cardiovascular;  Laterality: N/A;  . CLOSED REDUCTION SHOULDER DISLOCATION Right ~ 1975   "& reattached muscle"  . COLONOSCOPY W/ BIOPSIES AND POLYPECTOMY  X 2  . ESOPHAGOGASTRODUODENOSCOPY ENDOSCOPY    . PILONIDAL CYST EXCISION      Current Medications: Current Meds  Medication Sig  . Blood Glucose Monitoring Suppl (RELION PREMIER BLU MONITOR) DEVI 1 Device by Does not apply route daily.  Marland Kitchen esomeprazole (NEXIUM) 40 MG capsule Take 40 mg by mouth daily as needed (heartburn or acid reflux).  . fluticasone (FLONASE) 50 MCG/ACT nasal spray Place 1 spray into both nostrils daily as needed.  Marland Kitchen glipiZIDE (GLUCOTROL XL) 5 MG 24 hr tablet TAKE 1 TABLET BY MOUTH TWICE A DAY  . glucose blood test strip Use  as instructed to check sugar 2 times daily  . hydrALAZINE (APRESOLINE) 25 MG tablet TAKE 1 TABLET BY MOUTH TWICE A DAY  . hydrochlorothiazide (HYDRODIURIL) 25 MG tablet Take 1 tablet (25 mg total) by mouth daily.  . Insulin Detemir (LEVEMIR FLEXTOUCH) 100 UNIT/ML Pen Inject 20 Units into the skin daily at 10 pm.  . Insulin Pen Needle (NOVOFINE PLUS) 32G X 4 MM MISC Use 1x a day  . latanoprost (XALATAN) 0.005 % ophthalmic solution Place 1 drop into both eyes at bedtime.  Marland Kitchen losartan (COZAAR) 100 MG tablet Take 1 tablet (100 mg total) by mouth daily.  . metFORMIN (GLUCOPHAGE) 500 MG tablet TAKE 2 TABLETS (1,000 MG TOTAL) BY MOUTH 2 (TWO) TIMES DAILY WITH A MEAL.  .  nitroGLYCERIN (NITROSTAT) 0.4 MG SL tablet PLACE ONE TABLET UNDER THE TONGUE EVERY 5 MINUTES X3 DOSES AS NEEDED FOR CHEST PAIN  . pravastatin (PRAVACHOL) 40 MG tablet Take 40 mg by mouth daily.  . traZODone (DESYREL) 50 MG tablet Take 1 tablet (50 mg total) by mouth at bedtime as needed for sleep.  . [DISCONTINUED] metoprolol tartrate (LOPRESSOR) 25 MG tablet Take 25 mg by mouth 2 (two) times daily.     Allergies:   Statins and Imdur [isosorbide dinitrate]   Social History   Socioeconomic History  . Marital status: Married    Spouse name: [name]  . Number of children: 4  . Years of education: Not on file  . Highest education level: Not on file  Occupational History  . Occupation: Retired    Fish farm manager: UNEMPLOYED  Social Needs  . Financial resource strain: Not on file  . Food insecurity:    Worry: Not on file    Inability: Not on file  . Transportation needs:    Medical: Not on file    Non-medical: Not on file  Tobacco Use  . Smoking status: Former Smoker    Packs/day: 0.50    Years: 10.00    Pack years: 5.00    Types: Cigarettes    Last attempt to quit: 04/06/1983    Years since quitting: 35.0  . Smokeless tobacco: Never Used  Substance and Sexual Activity  . Alcohol use: Yes    Alcohol/week: 0.0 standard drinks    Comment: `/26/2017 "might drink a beer q couple months mostly; summertime I might drink 2-3 beers/week"  . Drug use: No  . Sexual activity: Not Currently  Lifestyle  . Physical activity:    Days per week: Not on file    Minutes per session: Not on file  . Stress: Not on file  Relationships  . Social connections:    Talks on phone: Not on file    Gets together: Not on file    Attends religious service: Not on file    Active member of club or organization: Not on file    Attends meetings of clubs or organizations: Not on file    Relationship status: Not on file  Other Topics Concern  . Not on file  Social History Narrative   Lives in [place] with  spouse [name]).  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     Family History: The patient's family history includes Alcohol abuse in an other family member; Colon cancer in his paternal uncle; Coronary artery disease in his mother and another family member; Diabetes in his maternal uncle, mother, paternal uncle, and sister; Heart disease in his mother; Other  in his father; Stroke in his mother. There is no history of Colon polyps or Esophageal cancer.  ROS:   Please see the history of present illness.    Shortness of breath.  Fatigue that he feels is related to prostate radiation therapy.  All other systems reviewed and are negative.  EKGs/Labs/Other Studies Reviewed:    The following studies were reviewed today: No new functional or imaging data  EKG:  EKG is sinus bradycardia 55 bpm with normal PR interval at 164.  Apple Watch demonstrates heart rates range between 60 and 80 bpm on current therapy.  Recent Labs: 05/18/2017: BUN 8; Creatinine, Ser 0.88; Hemoglobin 13.8; Platelets 216; Potassium 3.9; Sodium 138  Recent Lipid Panel    Component Value Date/Time   CHOL 164 06/19/2015 1624   TRIG 61.0 06/19/2015 1624   HDL 38.40 (L) 06/19/2015 1624   CHOLHDL 4 06/19/2015 1624   VLDL 12.2 06/19/2015 1624   LDLCALC 114 (H) 06/19/2015 1624   LDLDIRECT 117 (H) 01/17/2018 1456   LDLDIRECT 168.0 12/18/2012 1340    Physical Exam:    VS:  BP (!) 178/101   Pulse 73   Ht 5\' 9"  (1.753 m)   Wt 256 lb 9.6 oz (116.4 kg)   SpO2 98%   BMI 37.89 kg/m     Wt Readings from Last 3 Encounters:  04/18/18 256 lb 9.6 oz (116.4 kg)  03/31/18 254 lb 3.2 oz (115.3 kg)  02/06/18 250 lb (113.4 kg)     GEN: Obese.  Gradual weight gain over the past 3 months.. No acute distress HEENT: Normal NECK: No JVD. LYMPHATICS: No lymphadenopathy CARDIAC: RRR.  no murmur, gallop, edema VASCULAR: Pulses reveal 2+ bilateral radial and carotid, Bruits are  absent in the [place] RESPIRATORY:  Clear to auscultation without rales, wheezing or rhonchi  ABDOMEN: Soft, non-tender, non-distended, No pulsatile mass, MUSCULOSKELETAL: No deformity  SKIN: Warm and dry NEUROLOGIC:  Alert and oriented x 3 PSYCHIATRIC:  Normal affect   ASSESSMENT:    1. CAD in native artery   2. Hyperlipidemia LDL goal <70   3. Hypertension, unspecified type   4. Obstructive sleep apnea   5. PAF (paroxysmal atrial fibrillation) ([place])   6. Type 2 diabetes mellitus with other circulatory complication, with long-term current use of insulin ([facility])    PLAN:    In order of problems listed above:  1. Stable without angina.  Likely collateralization of the remaining obtuse marginal branch that we were unable to stent. 2. LDL target should be less than 70.  Not currently taking Pravachol because unable to afford medication and has had difficulty on most statins.  Was in lipid clinic attempting to get started on PCSK9, which has not happened yet. 3. 2 g sodium diet, weight loss, exercise were all discussed.  Increase metoprolol to 50 mg twice daily.  If blood pressure does not improve with increased beta-blocker and better control of sleep apnea, will add Aldactone. 4. Encourage compliance with CPAP.  Not currently treating sleep apnea. 5. No recurrence 6. Consider SGLT2 therapy  This is a frustrating clinical situation.  He is not on statin therapy, he is not treating sleep apnea, he is gaining weight, diet is out of control.  We have added a higher dose of metoprolol and encouraged him to become more physically active, cut out salt in the diet, and resume therapy for sleep apnea.  1 month follow-up to check blood pressure and if still elevated add Spironolactone.   Medication  Adjustments/Labs and Tests Ordered: Current medicines are reviewed at length with the patient today.  Concerns regarding medicines are outlined above.  No orders of the defined types were placed in this  encounter.  No orders of the defined types were placed in this encounter.   There are no Patient Instructions on file for this visit.   Signed, [name], MD  04/18/2018 4:31 PM    [facility]

## 2018-04-18 ENCOUNTER — Encounter: Payer: Self-pay | Admitting: Interventional Cardiology

## 2018-04-18 ENCOUNTER — Ambulatory Visit (INDEPENDENT_AMBULATORY_CARE_PROVIDER_SITE_OTHER): Payer: Federal, State, Local not specified - PPO | Admitting: Interventional Cardiology

## 2018-04-18 ENCOUNTER — Ambulatory Visit
Admission: RE | Admit: 2018-04-18 | Discharge: 2018-04-18 | Disposition: A | Discharge status: Home / Self Care | Payer: Federal, State, Local not specified - PPO | Source: Ambulatory Visit | Attending: Radiation Oncology | Admitting: Radiation Oncology

## 2018-04-18 VITALS — BP 178/101 | HR 73 | Ht 69.0 in | Wt 256.6 lb

## 2018-04-18 DIAGNOSIS — G720 Drug-induced myopathy: Secondary | ICD-10-CM

## 2018-04-18 DIAGNOSIS — E785 Hyperlipidemia, unspecified: Secondary | ICD-10-CM | POA: Diagnosis not present

## 2018-04-18 DIAGNOSIS — I10 Essential (primary) hypertension: Secondary | ICD-10-CM | POA: Diagnosis not present

## 2018-04-18 DIAGNOSIS — E1159 Type 2 diabetes mellitus with other circulatory complications: Secondary | ICD-10-CM

## 2018-04-18 DIAGNOSIS — G4733 Obstructive sleep apnea (adult) (pediatric): Secondary | ICD-10-CM | POA: Diagnosis not present

## 2018-04-18 DIAGNOSIS — T466X5A Adverse effect of antihyperlipidemic and antiarteriosclerotic drugs, initial encounter: Secondary | ICD-10-CM

## 2018-04-18 DIAGNOSIS — I2510 Atherosclerotic heart disease of native coronary artery without angina pectoris: Secondary | ICD-10-CM

## 2018-04-18 DIAGNOSIS — I480 Paroxysmal atrial fibrillation: Secondary | ICD-10-CM

## 2018-04-18 DIAGNOSIS — Z794 Long term (current) use of insulin: Secondary | ICD-10-CM | POA: Diagnosis not present

## 2018-04-18 DIAGNOSIS — C61 Malignant neoplasm of prostate: Secondary | ICD-10-CM | POA: Diagnosis not present

## 2018-04-18 MED ORDER — METOPROLOL TARTRATE 50 MG PO TABS: 50.0000 mg | ORAL_TABLET | Freq: Two times a day (BID) | ORAL | 3 refills | Status: DC

## 2018-04-18 NOTE — Patient Instructions (Signed)
Medication Instructions:  1) INCREASE Metoprolol Tartrate to 50mg  twice daily  If you need a refill on your cardiac medications before your next appointment, please call your pharmacy.   Lab work: None If you have labs (blood work) drawn today and your tests are completely normal, you will receive your results only by: Marland Kitchen MyChart Message (if you have MyChart) OR . A paper copy in the mail If you have any lab test that is abnormal or we need to change your treatment, we will call you to review the results.  Testing/Procedures: None  Follow-Up: Your physician recommends that you schedule a follow-up appointment in: 1 month with a PA, NP or Hypertension Clinic.    Any Other Special Instructions Will Be Listed Below (If Applicable).

## 2018-04-19 ENCOUNTER — Ambulatory Visit
Admission: RE | Admit: 2018-04-19 | Discharge: 2018-04-19 | Disposition: A | Discharge status: Home / Self Care | Payer: Federal, State, Local not specified - PPO | Source: Ambulatory Visit | Attending: Radiation Oncology | Admitting: Radiation Oncology

## 2018-04-19 DIAGNOSIS — C61 Malignant neoplasm of prostate: Secondary | ICD-10-CM | POA: Diagnosis not present

## 2018-04-20 ENCOUNTER — Ambulatory Visit
Admission: RE | Admit: 2018-04-20 | Discharge: 2018-04-20 | Disposition: A | Discharge status: Home / Self Care | Payer: Federal, State, Local not specified - PPO | Source: Ambulatory Visit | Attending: Radiation Oncology | Admitting: Radiation Oncology

## 2018-04-20 DIAGNOSIS — C61 Malignant neoplasm of prostate: Secondary | ICD-10-CM | POA: Diagnosis not present

## 2018-04-21 ENCOUNTER — Ambulatory Visit
Admission: RE | Admit: 2018-04-21 | Discharge: 2018-04-21 | Disposition: A | Discharge status: Home / Self Care | Payer: Federal, State, Local not specified - PPO | Source: Ambulatory Visit | Attending: Radiation Oncology | Admitting: Radiation Oncology

## 2018-04-21 DIAGNOSIS — C61 Malignant neoplasm of prostate: Secondary | ICD-10-CM | POA: Diagnosis not present

## 2018-04-24 ENCOUNTER — Ambulatory Visit
Admission: RE | Admit: 2018-04-24 | Discharge: 2018-04-24 | Disposition: A | Discharge status: Home / Self Care | Payer: Federal, State, Local not specified - PPO | Source: Ambulatory Visit | Attending: Radiation Oncology | Admitting: Radiation Oncology

## 2018-04-24 DIAGNOSIS — C61 Malignant neoplasm of prostate: Secondary | ICD-10-CM | POA: Diagnosis not present

## 2018-04-25 ENCOUNTER — Ambulatory Visit
Admission: RE | Admit: 2018-04-25 | Discharge: 2018-04-25 | Disposition: A | Discharge status: Home / Self Care | Payer: Federal, State, Local not specified - PPO | Source: Ambulatory Visit | Attending: Radiation Oncology | Admitting: Radiation Oncology

## 2018-04-25 ENCOUNTER — Ambulatory Visit: Payer: Federal, State, Local not specified - PPO

## 2018-04-25 DIAGNOSIS — C61 Malignant neoplasm of prostate: Secondary | ICD-10-CM | POA: Diagnosis not present

## 2018-04-26 ENCOUNTER — Encounter: Payer: Self-pay | Admitting: Medical Oncology

## 2018-04-26 ENCOUNTER — Ambulatory Visit
Admission: RE | Admit: 2018-04-26 | Discharge: 2018-04-26 | Disposition: A | Discharge status: Home / Self Care | Payer: Federal, State, Local not specified - PPO | Source: Ambulatory Visit | Attending: Radiation Oncology | Admitting: Radiation Oncology

## 2018-04-26 DIAGNOSIS — C61 Malignant neoplasm of prostate: Secondary | ICD-10-CM | POA: Diagnosis not present

## 2018-04-28 ENCOUNTER — Encounter: Payer: Self-pay | Admitting: Radiation Oncology

## 2018-04-28 NOTE — Progress Notes (Signed)
  Radiation Oncology         [phone] ________________________________  Name: [name] MRN: [id number]  Date: 04/28/2018  DOB: May 04, 1950  End of Treatment Note  Diagnosis:   68 y.o. gentleman with intermediate risk, Stage T1c adenocarcinoma of the prostate with Gleason Score of 3+4, and PSA of 5.5     Indication for treatment:  Curative, Definitive Radiotherapy       Radiation treatment dates:   03/15/2018 - 04/26/2018  Site/dose:   The prostate was treated to 70 Gy in 28 fractions of 2.5 Gy  Beams/energy:   The patient was treated with IMRT using volumetric arc therapy delivering 6 MV X-rays to clockwise and counterclockwise circumferential arcs with a 90 degree collimator offset to avoid dose scalloping.  Image guidance was performed with daily cone beam CT prior to each fraction to align to gold markers in the prostate and assure proper bladder and rectal fill volumes.  Immobilization was achieved with BodyFix custom mold.  Narrative: The patient tolerated radiation treatment relatively well. He experienced some minor urinary irritation and modest fatigue with increased nocturia, up from x1 to x4, and increased difficulty emptying his bladder with weak stream, post-void dribble, urgency and a single  episode of incontinence. He denied bowel issues throughout treatment.   Plan: The patient has completed radiation treatment. He will return to radiation oncology clinic for routine followup in one month. I advised him to call or return sooner if he has any questions or concerns related to his recovery or treatment. ________________________________  Sheral Apley. Tammi Klippel, M.D.  This document serves as a record of services personally performed by Tyler Pita, MD. It was created on his behalf by Wilburn Mylar, a trained medical scribe. The creation of this record is based on the scribe's personal observations and the provider's statements to them. This document has been checked  and approved by the attending provider.

## 2018-04-30 ENCOUNTER — Encounter: Payer: Self-pay | Admitting: Family Medicine

## 2018-04-30 ENCOUNTER — Other Ambulatory Visit: Payer: Self-pay | Admitting: Family Medicine

## 2018-05-04 ENCOUNTER — Ambulatory Visit (INDEPENDENT_AMBULATORY_CARE_PROVIDER_SITE_OTHER): Payer: Federal, State, Local not specified - PPO

## 2018-05-04 ENCOUNTER — Telehealth: Payer: Self-pay

## 2018-05-04 DIAGNOSIS — Z23 Encounter for immunization: Secondary | ICD-10-CM

## 2018-05-04 NOTE — Telephone Encounter (Signed)
Copied from [place] [id number]. Topic: General - Other >> May 04, 2018  9:54 AM Yvette Rack wrote: Reason for CRM: Pt called in for an update on the refill requests. Pt stated he has been waiting to get his Rx but the pharmacy still has not received anything. Pt requests call back. Cb# [phone]

## 2018-05-04 NOTE — Telephone Encounter (Signed)
Called pt I haven't received a refill rx from pharmacy pt aware he is asking pharmacy to send another request for medications needed.

## 2018-05-04 NOTE — Progress Notes (Signed)
After obtaining consent, PNVV-13 0.68mL given in Left Deltoid by Mikel Hardgrove Berneta Sages. Patient instructed to remain in clinic for 20 minutes afterwards, and to report any adverse reaction to me immediately.

## 2018-05-05 ENCOUNTER — Other Ambulatory Visit: Payer: Self-pay

## 2018-05-05 MED ORDER — HYDROCHLOROTHIAZIDE 25 MG PO TABS: 25.0000 mg | ORAL_TABLET | Freq: Every day | ORAL | 3 refills | Status: DC

## 2018-05-05 MED ORDER — LOSARTAN POTASSIUM 100 MG PO TABS: 100.0000 mg | ORAL_TABLET | Freq: Every day | ORAL | 3 refills | Status: DC

## 2018-05-08 ENCOUNTER — Telehealth: Payer: Self-pay

## 2018-05-08 NOTE — Telephone Encounter (Signed)
okay

## 2018-05-08 NOTE — Telephone Encounter (Signed)
Copied from [place] [id number]. Topic: General - Other >> May 08, 2018 10:30 AM Yvette Rack wrote: Reason for CRM: pt calling wanting to transfer care from [facility] to [place] he has nothing against Dr Bryan Lemma but he would only like to see her for bones and muscles but need Ethelene Hal as his provider because he can issue medicine that he need and to help get him off of some  may leave message on machine

## 2018-05-08 NOTE — Telephone Encounter (Signed)
Dr. Loletha Grayer, Dr. Ethelene Hal are you guys okay with this switch?

## 2018-05-10 NOTE — Telephone Encounter (Signed)
Please help with this transfer

## 2018-05-10 NOTE — Telephone Encounter (Signed)
[place] with me for pt to switch PCP to Dr. Ethelene Hal

## 2018-05-12 DIAGNOSIS — M170 Bilateral primary osteoarthritis of knee: Secondary | ICD-10-CM | POA: Diagnosis not present

## 2018-05-12 DIAGNOSIS — S83242A Other tear of medial meniscus, current injury, left knee, initial encounter: Secondary | ICD-10-CM | POA: Diagnosis not present

## 2018-05-17 ENCOUNTER — Ambulatory Visit: Payer: Self-pay | Admitting: Urology

## 2018-05-17 DIAGNOSIS — M25562 Pain in left knee: Secondary | ICD-10-CM | POA: Diagnosis not present

## 2018-05-19 DIAGNOSIS — M170 Bilateral primary osteoarthritis of knee: Secondary | ICD-10-CM | POA: Diagnosis not present

## 2018-05-19 DIAGNOSIS — S83242A Other tear of medial meniscus, current injury, left knee, initial encounter: Secondary | ICD-10-CM | POA: Diagnosis not present

## 2018-05-22 ENCOUNTER — Telehealth: Payer: Self-pay | Admitting: *Deleted

## 2018-05-22 NOTE — Telephone Encounter (Signed)
   [facility] Pre-operative Risk Assessment    Request for surgical clearance:  1. What type of surgery is being performed? LEFT KNEE SCOPE   2. When is this surgery scheduled? TBD   3. What type of clearance is required (medical clearance vs. Pharmacy clearance to hold med vs. Both)? MEDICAL  4. Are there any medications that need to be held prior to surgery and how long?NONE LISTED    5. Practice name and name of physician performing surgery? [facility]; DR. [name]   6. What is your office phone number [phone]    7.   What is your office fax number [phone]  8.   Anesthesia type (None, local, MAC, general) ? CHOICE   Julaine Hua 05/22/2018, 10:12 AM  _________________________________________________________________   (provider comments below)

## 2018-05-23 ENCOUNTER — Ambulatory Visit: Payer: Federal, State, Local not specified - PPO | Admitting: Cardiology

## 2018-05-23 ENCOUNTER — Ambulatory Visit (INDEPENDENT_AMBULATORY_CARE_PROVIDER_SITE_OTHER): Payer: Federal, State, Local not specified - PPO | Admitting: Cardiology

## 2018-05-23 ENCOUNTER — Encounter: Payer: Self-pay | Admitting: Cardiology

## 2018-05-23 VITALS — BP 162/90 | HR 66 | Ht 70.0 in | Wt 250.4 lb

## 2018-05-23 DIAGNOSIS — I480 Paroxysmal atrial fibrillation: Secondary | ICD-10-CM | POA: Diagnosis not present

## 2018-05-23 DIAGNOSIS — Z79899 Other long term (current) drug therapy: Secondary | ICD-10-CM

## 2018-05-23 DIAGNOSIS — I10 Essential (primary) hypertension: Secondary | ICD-10-CM | POA: Diagnosis not present

## 2018-05-23 MED ORDER — SPIRONOLACTONE 25 MG PO TABS: 12.5000 mg | ORAL_TABLET | Freq: Every day | ORAL | 3 refills | Status: DC

## 2018-05-23 NOTE — Patient Instructions (Addendum)
Medication Instructions:  START SPIRONOLACTONE 12.5 mg DAILY If you need a refill on your cardiac medications before your next appointment, please call your pharmacy.   Lab work:TODAY BMET If you have labs (blood work) drawn today and your tests are completely normal, you will receive your results only by: Marland Kitchen MyChart Message (if you have MyChart) OR . A paper copy in the mail If you have any lab test that is abnormal or we need to change your treatment, we will call you to review the results.  Testing/Procedures: NONE  Follow-Up: 1 WEEK BP CHECK AND BMET LAB NURSE VISIT WHEN Dr [place]                     2 WEEKS APP     At [facility], you and your health needs are our priority.  As part of our continuing mission to provide you with exceptional heart care, we have created designated Provider Care Teams.  These Care Teams include your primary Cardiologist (physician) and Advanced Practice Providers (APPs -  Physician Assistants and Nurse Practitioners) who all work together to provide you with the care you need, when you need it. .   Any Other Special Instructions Will Be Listed Below (If Applicable).

## 2018-05-23 NOTE — Progress Notes (Signed)
05/23/2018 [name]   28-Sep-1950  [id number]  Primary Physician Ronnald Nian, DO Primary Cardiologist: [name], MD  Electrophysiologist: None   Reason for Visit/CC: 1) follow-up for hypertension     2) preoperative cardiac evaluation  HPI:  [name] is a 68 y.o. male who is being seen today for follow-up for hypertension as well as for preoperative cardiac examination.  He is followed by Dr. Tamala Julian and has a history of known CAD s/p DES to LCx 11/2014, OM1 not stented due to tortuosity, HTN, HLD, OSA not on C-PAP and PAT/PAF (not on anticoagulation due to low burden).  He also has a recent diagnosis of prostate cancer and is now receiving radiation.  He was recently seen by Dr. Tamala Julian last month on April 18, 2018 for routine follow-up.  He denied any anginal symptomatology but his blood pressure was elevated at 178/101.  Dr. Tamala Julian elected to increase his metoprolol to 50 mg twice daily with instruction to consider addition of Spironolactone if his blood pressure remains elevated.  The patient presents today for follow-up of his hypertension but also needs cardiac clearance prior to anticipated arthroscopic knee surgery.  BP remains elevated today at 162/90.  Heart rate 66 bpm. Currently asymptomatic.  He reports full compliance with medication including the increased dose of metoprolol.  Claims that he has been trying to avoid salt.  From a cardiac symptom standpoint, he denies any anginal symptomatology.  No exertional chest pain or dyspnea.  He tells me he is able to walk up a flight of stairs without any exertional symptoms.  His EKG today shows normal sinus rhythm with first-degree AV block and nonspecific T wave abnormalities.  No significant change compared to prior EKGs.   Cardiac Studies  LHC 05/01/15 Coronary Stent Intervention  Left Heart Cath and Coronary Angiography  Conclusion   1. 1st Diag lesion, 70% stenosed. 2. Ost 1st Diag to 1st Diag  lesion, 70% stenosed. 3. 2nd Diag lesion, 75% stenosed. 4. Mid LAD to Dist LAD lesion, 60% stenosed. 5. Prox RCA to Dist RCA lesion, 25% stenosed. 6. Dist Cx lesion, 30% stenosed. 7. Ost 1st Diag lesion, 90% stenosed. 8. 1st Mrg-1 lesion, 95% stenosed. 9. 1st Mrg-2 lesion, 95% stenosed. Post intervention, there is a 95% residual stenosis. The lesion was previously treated with angioplasty.    Continued widely patent mid circumflex stent.  95% stenosis in the mid body of the first obtuse marginal, representing restenosis after angioplasty in August reduced the lesion to 50%.  90% ostial first diagonal  Otherwise no change in anatomy compared to the prior post PCI angiogram  Normal LV function  Failed first obtuse marginal PTCA due to inability to cross the stenosis. Angulation in the vessel prevented torque ability and we will unable to solve lack of intra-coronary support.   Recommendation:   Continue medical therapy  Monitor overnight and discharge in a.m.  No change in medical regimen  Resume phase II cardiac rehabilitation    2D Echo 03/08/16 Study Conclusions  - Left ventricle: The cavity size was normal. Wall thickness was   increased in a pattern of mild LVH. Systolic function was normal.   The estimated ejection fraction was in the range of 55% to 60%.   Wall motion was normal; there were no regional wall motion   abnormalities. Doppler parameters are consistent with abnormal   left ventricular relaxation (grade 1 diastolic dysfunction). - Aorta: Aortic root dimension: 41 mm (ED). -  Ascending aorta: The ascending aorta was mildly dilated. - Mitral valve: Moderately calcified annulus. There was systolic   anterior motion of the chordal structures. - Left atrium: The atrium was moderately dilated.  Impressions:  - Compared to the prior study, there has been no significant   interval change.   Current Meds  Medication Sig  . Blood Glucose Monitoring  Suppl (RELION PREMIER BLU MONITOR) DEVI 1 Device by Does not apply route daily.  Marland Kitchen esomeprazole (NEXIUM) 40 MG capsule Take 40 mg by mouth daily as needed (heartburn or acid reflux).  . fluticasone (FLONASE) 50 MCG/ACT nasal spray Place 1 spray into both nostrils daily as needed.  Marland Kitchen glipiZIDE (GLUCOTROL XL) 5 MG 24 hr tablet TAKE 1 TABLET BY MOUTH TWICE A DAY  . glucose blood test strip Use as instructed to check sugar 2 times daily  . hydrALAZINE (APRESOLINE) 25 MG tablet TAKE 1 TABLET BY MOUTH TWICE A DAY  . hydrochlorothiazide (HYDRODIURIL) 25 MG tablet Take 1 tablet (25 mg total) by mouth daily.  . Insulin Detemir (LEVEMIR FLEXTOUCH) 100 UNIT/ML Pen Inject 20 Units into the skin daily at 10 pm.  . Insulin Pen Needle (NOVOFINE PLUS) 32G X 4 MM MISC Use 1x a day  . latanoprost (XALATAN) 0.005 % ophthalmic solution Place 1 drop into both eyes at bedtime.  Marland Kitchen losartan (COZAAR) 100 MG tablet Take 1 tablet (100 mg total) by mouth daily.  . metFORMIN (GLUCOPHAGE) 500 MG tablet TAKE 2 TABLETS (1,000 MG TOTAL) BY MOUTH 2 (TWO) TIMES DAILY WITH A MEAL.  . metoprolol tartrate (LOPRESSOR) 50 MG tablet Take 1 tablet (50 mg total) by mouth 2 (two) times daily.  . nitroGLYCERIN (NITROSTAT) 0.4 MG SL tablet PLACE ONE TABLET UNDER THE TONGUE EVERY 5 MINUTES X3 DOSES AS NEEDED FOR CHEST PAIN  . pravastatin (PRAVACHOL) 40 MG tablet Take 40 mg by mouth daily.  . traZODone (DESYREL) 50 MG tablet Take 1 tablet (50 mg total) by mouth at bedtime as needed for sleep.   Allergies  Allergen Reactions  . Statins Other (See Comments)    REACTION: joint pain Lipitor- headaches Has also tried Livalo, pravachol, zetia, crestor, welchol  . Imdur [Isosorbide Dinitrate]     Headache - "violent"   Past Medical History:  Diagnosis Date  . Adenomatous colon polyp 2011  . Anemia   . Angina pectoris ([place]) 05/01/2015   med rx for 95% OM (u/a to access due to tortuosity), 95% D1 and other moderate CAD at cath  . Arthritis     "back, knees" (05/01/2015)  . Chest pain    pleuritic  . Chronic lower back pain   . Coronary artery disease   . Depression   . GERD (gastroesophageal reflux disease)   . HTN (hypertension)   . Hyperlipidemia   . Obstructive sleep apnea    noncompliant with CPAP  . Osteoarthritis   . Pneumonia ~ 2014 X 1  . Prostate cancer ([place])   . Refusal of blood transfusions as patient is Jehovah's Witness   . Type II or unspecified type diabetes mellitus without mention of complication, not stated as uncontrolled    Family History  Problem Relation Age of Onset  . Coronary artery disease Other        CABG  . Coronary artery disease Mother   . Stroke Mother   . Heart disease Mother   . Diabetes Mother   . Other Father   . Diabetes Sister   . Diabetes Maternal  Uncle        x2  . Alcohol abuse Other   . Diabetes Paternal Uncle        x2  . Colon cancer Paternal Uncle   . Colon polyps Neg Hx   . Esophageal cancer Neg Hx    Past Surgical History:  Procedure Laterality Date  . CARDIAC CATHETERIZATION  2004   patent coronary arteries  . CARDIAC CATHETERIZATION  05/01/2015   "tried to put stent in but couldn't"  . CARDIAC CATHETERIZATION N/A 05/01/2015   Procedure: Left Heart Cath and Coronary Angiography;  Surgeon: Belva Crome, MD; LAD 60%, oD1 90%, pD1 70%, D2 70%, CFX patent stent, 30% distal to prev stent, pRCA 20%, OM1 90/95%, NL LV  . CARDIAC CATHETERIZATION N/A 05/01/2015   Procedure: Coronary Stent Intervention;  Surgeon: Belva Crome, MD;  Unsuccessful PCI OM due to tortuosity  . CARDIAC CATHETERIZATION N/A 11/06/2014   Procedure: Left Heart Cath and Coronary Angiography;  Surgeon: Belva Crome, MD;  Location: [facility];  Service: Cardiovascular;  Laterality: N/A;  . CLOSED REDUCTION SHOULDER DISLOCATION Right ~ 1975   "& reattached muscle"  . COLONOSCOPY W/ BIOPSIES AND POLYPECTOMY  X 2  . ESOPHAGOGASTRODUODENOSCOPY ENDOSCOPY    . PILONIDAL CYST EXCISION      Social History   Socioeconomic History  . Marital status: Married    Spouse name: [name]  . Number of children: 4  . Years of education: Not on file  . Highest education level: Not on file  Occupational History  . Occupation: Retired    Fish farm manager: UNEMPLOYED  Social Needs  . Financial resource strain: Not on file  . Food insecurity:    Worry: Not on file    Inability: Not on file  . Transportation needs:    Medical: Not on file    Non-medical: Not on file  Tobacco Use  . Smoking status: Former Smoker    Packs/day: 0.50    Years: 10.00    Pack years: 5.00    Types: Cigarettes    Last attempt to quit: 04/06/1983    Years since quitting: 35.1  . Smokeless tobacco: Never Used  Substance and Sexual Activity  . Alcohol use: Yes    Alcohol/week: 0.0 standard drinks    Comment: `/26/2017 "might drink a beer q couple months mostly; summertime I might drink 2-3 beers/week"  . Drug use: No  . Sexual activity: Not Currently  Lifestyle  . Physical activity:    Days per week: Not on file    Minutes per session: Not on file  . Stress: Not on file  Relationships  . Social connections:    Talks on phone: Not on file    Gets together: Not on file    Attends religious service: Not on file    Active member of club or organization: Not on file    Attends meetings of clubs or organizations: Not on file    Relationship status: Not on file  . Intimate partner violence:    Fear of current or ex partner: Not on file    Emotionally abused: Not on file    Physically abused: Not on file    Forced sexual activity: Not on file  Other Topics Concern  . Not on file  Social History Narrative   Lives in [place] with spouse Abram Sax).  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  Lipid Panel     Component Value Date/Time   CHOL 164 06/19/2015 1624   TRIG 61.0 06/19/2015 1624   HDL 38.40 (L) 06/19/2015 1624   CHOLHDL 4 06/19/2015  1624   VLDL 12.2 06/19/2015 1624   LDLCALC 114 (H) 06/19/2015 1624   LDLDIRECT 117 (H) 01/17/2018 1456   LDLDIRECT 168.0 12/18/2012 1340    Review of Systems: General: negative for chills, fever, night sweats or weight changes.  Cardiovascular: negative for chest pain, dyspnea on exertion, edema, orthopnea, palpitations, paroxysmal nocturnal dyspnea or shortness of breath Dermatological: negative for rash Respiratory: negative for cough or wheezing Urologic: negative for hematuria Abdominal: negative for nausea, vomiting, diarrhea, bright red blood per rectum, melena, or hematemesis Neurologic: negative for visual changes, syncope, or dizziness All other systems reviewed and are otherwise negative except as noted above.   Physical Exam:  Blood pressure (!) 162/90, pulse 66, height 5\' 10"  (1.778 m), weight 250 lb 6.4 oz (113.6 kg), SpO2 98 %.  General appearance: alert, cooperative and no distress Neck: no carotid bruit and no JVD Lungs: clear to auscultation bilaterally Heart: regular rate and rhythm, S1, S2 normal, no murmur, click, rub or gallop Extremities: extremities normal, atraumatic, no cyanosis or edema Pulses: 2+ and symmetric Skin: Skin color, texture, turgor normal. No rashes or lesions Neurologic: Grossly normal  EKG NSR, 1st degree AV block, nonspecific T wave abnormalies -- personally reviewed   ASSESSMENT AND PLAN:   1.  Hypertension: Blood pressure remains elevated despite recent increase in metoprolol dose to 50 mg twice daily.  Per Dr. Thompson Caul recommendation in his last office note, if blood pressure remains elevated add spironolactone.  We will check a basic metabolic panel today to check baseline renal function and potassium levels.  If normal, we will plan to start spironolactone 12.5 mg daily with plans to repeat basic metabolic panel 1 week later and return for visit for repeat blood pressure check. If needed, can possibly further increase dose to 25 mg daily  if he tolerates w/o side effects.  We also discussed dietary restriction of sodium.  2.  CAD: s/p DES to LCx 11/2014, OM1 not stented due to tortuosity. He denies anginal symptomatology.  He is able to ambulate a flight of stairs without exertional symptoms.  Continue medical therapy.  3.  Hyperlipidemia: Patient reports that he was not able to afford PCSK9 inhibitors.  Continues on pravastatin.  4.  Obstructive sleep apnea: Patient reports that he is not compliant with his CPAP machine.  Cannot tolerate device.  5.  Left knee pain: Pending arthroscopic knee surgery per orthopedics  6.  Preoperative cardiac evaluation: Despite CAD he denies any recent anginal symptomatology.  Despite his knee pain he tells me that he is able to ambulate a flight of stairs without exertional chest pain or dyspnea.  His EKG shows normal sinus rhythm with first-degree AV block and nonspecific T wave abnormalities however no significant change compared to prior EKG. He is planning to undergo low risk noncardiac surgery.  Based on guidelines, he can be cleared for surgery without need for further cardiac testing.  He does have elevated BP.  He will need some further titration of his antihypertensives but I do not suspect that this should delay his surgery.  We will plan however to have him follow-up in 1 week for repeat blood pressure check given start of new antihypertensive.   Follow-Up in 1 week for f/u BMP after starting spironolactone + BP check w/ RN.  Calib Wadhwa Ladoris Gene, MHS [facility] 05/23/2018 4:09 PM

## 2018-05-23 NOTE — Telephone Encounter (Signed)
Left detailed message for pt that he cancelled his appt today, but in order for him to be cleared for his knee procedure, he will need to be seen 1st and to call back and reschedule the appt sooner than 07/2018.

## 2018-05-23 NOTE — Telephone Encounter (Signed)
Patient cancelled his appointment today. He needs to be seen in clinic based on last office visit note from Dr. Tamala Julian 04/18/2018. Please let patient know that we can not cleared him for knee surgery until seen in clinic.

## 2018-05-23 NOTE — Telephone Encounter (Signed)
Pt has been put back on [place], PA-C schedule for this afternoon @ 3:00.

## 2018-05-24 ENCOUNTER — Telehealth: Payer: Self-pay

## 2018-05-24 LAB — BASIC METABOLIC PANEL
BUN/Creatinine Ratio: 12 (ref 10–24)
BUN: 11 mg/dL (ref 8–27)
CO2: 27 mmol/L (ref 20–29)
Calcium: 8.6 mg/dL (ref 8.6–10.2)
Chloride: 102 mmol/L (ref 96–106)
Creatinine, Ser: 0.91 mg/dL (ref 0.76–1.27)
GFR calc Af Amer: 100 mL/min/{1.73_m2} (ref >59)
GFR calc non Af Amer: 86 mL/min/{1.73_m2} (ref >59)
Glucose: 148 mg/dL — ABNORMAL HIGH (ref 65–99)
Potassium: 4.1 mmol/L (ref 3.5–5.2)
Sodium: 141 mmol/L (ref 134–144)

## 2018-05-24 NOTE — Telephone Encounter (Signed)
-----   Message from [place], [place] sent at 05/24/2018 11:08 AM EST ----- Renal function and potassium levels are both normal. Start spironolactone 12.5 mg daily for BP. Return for f/u BMP in 7-10 days to ensure kidney function and potassium levels are ok with new medication. He will also need BP check w/ RN same day as labs.

## 2018-05-24 NOTE — Telephone Encounter (Signed)
Notes recorded by Frederik Schmidt, RN on 05/24/2018 at 11:14 AM EST The patient has been notified of the result and verbalized understanding. Started Spironolactone 12.5 mg Daily and reviewed upcoming appt for labs and BP check. All questions (if any) were answered. Frederik Schmidt, RN 05/24/2018 11:13 AM

## 2018-05-31 ENCOUNTER — Ambulatory Visit: Payer: Federal, State, Local not specified - PPO | Admitting: Urology

## 2018-05-31 NOTE — Telephone Encounter (Signed)
Patient called to reschedule his follow-up appointment that was for today. He has been rescheduled for 06/08/18 @ 2:00 p.m.

## 2018-06-01 ENCOUNTER — Ambulatory Visit: Payer: Federal, State, Local not specified - PPO

## 2018-06-01 ENCOUNTER — Other Ambulatory Visit: Payer: Federal, State, Local not specified - PPO

## 2018-06-05 ENCOUNTER — Encounter: Payer: Federal, State, Local not specified - PPO | Admitting: Family Medicine

## 2018-06-05 ENCOUNTER — Ambulatory Visit: Payer: Federal, State, Local not specified - PPO | Admitting: Cardiology

## 2018-06-08 ENCOUNTER — Ambulatory Visit: Payer: Federal, State, Local not specified - PPO | Admitting: Internal Medicine

## 2018-06-08 ENCOUNTER — Encounter: Payer: Self-pay | Admitting: Urology

## 2018-06-08 ENCOUNTER — Ambulatory Visit
Admission: RE | Admit: 2018-06-08 | Discharge: 2018-06-08 | Disposition: A | Discharge status: Home / Self Care | Payer: Federal, State, Local not specified - PPO | Source: Ambulatory Visit | Attending: Urology | Admitting: Urology

## 2018-06-08 ENCOUNTER — Other Ambulatory Visit: Payer: Self-pay

## 2018-06-08 VITALS — BP 179/93 | HR 64 | Temp 98.2°F | Resp 18 | Ht 70.0 in | Wt 250.0 lb

## 2018-06-08 DIAGNOSIS — Z794 Long term (current) use of insulin: Secondary | ICD-10-CM | POA: Diagnosis not present

## 2018-06-08 DIAGNOSIS — N3943 Post-void dribbling: Secondary | ICD-10-CM | POA: Insufficient documentation

## 2018-06-08 DIAGNOSIS — Z923 Personal history of irradiation: Secondary | ICD-10-CM | POA: Insufficient documentation

## 2018-06-08 DIAGNOSIS — R3912 Poor urinary stream: Secondary | ICD-10-CM | POA: Insufficient documentation

## 2018-06-08 DIAGNOSIS — R5383 Other fatigue: Secondary | ICD-10-CM | POA: Insufficient documentation

## 2018-06-08 DIAGNOSIS — R351 Nocturia: Secondary | ICD-10-CM | POA: Diagnosis not present

## 2018-06-08 DIAGNOSIS — Z79899 Other long term (current) drug therapy: Secondary | ICD-10-CM | POA: Diagnosis not present

## 2018-06-08 DIAGNOSIS — C61 Malignant neoplasm of prostate: Secondary | ICD-10-CM

## 2018-06-08 NOTE — Progress Notes (Signed)
Radiation Oncology         [phone] ________________________________  Name: [name] MRN: [id number]  Date: 06/08/2018  DOB: 1950-04-09  Post Treatment Note  CC: [name], MD  [name], MD  Diagnosis:   68 y.o.gentleman with intermediate risk, Stage T1c adenocarcinoma of the prostate with Gleason Score of 3+4, and PSA of 5.5     Interval Since Last Radiation:  6 weeks  03/15/2018 - 04/26/2018:  The prostate was treated to 70 Gy in 28 fractions of 2.5 Gy  Narrative:  The patient returns today for routine follow-up.  He tolerated radiation treatment relatively well. He experienced some minor urinary irritation and modest fatigue with increased nocturia, up from x1 to x4, and increased difficulty emptying his bladder with weak stream, post-void dribble, urgency and a single  episode of incontinence. He denied bowel issues throughout treatment.                               On review of systems, the patient states that he is doing well overall.  He reports gradual improvement in his LUTS with a current IPSS score of 16 indicating moderate urinary symptoms.  He continues with increased urgency, weakened flow of stream, intermittency and occasional feelings of incomplete bladder emptying.  He specifically denies dysuria, gross hematuria, excessive daytime frequency, straining to void or incontinence.  He reports nocturia x4 per night which is his baseline.  He denies any significant decrease in his energy level.  He reports a healthy appetite and is maintaining his weight.  He denies abdominal pain, nausea, vomiting, diarrhea or constipation.  Overall, he is quite pleased with his progress to date.  ALLERGIES:  is allergic to statins and imdur [isosorbide dinitrate].  Meds: Current Outpatient Medications  Medication Sig Dispense Refill  . Blood Glucose Monitoring Suppl (RELION PREMIER BLU MONITOR) DEVI 1 Device by Does not apply route daily. 1 Device 0  .  esomeprazole (NEXIUM) 40 MG capsule Take 40 mg by mouth daily as needed (heartburn or acid reflux).    . fluticasone (FLONASE) 50 MCG/ACT nasal spray Place 1 spray into both nostrils daily as needed.    Marland Kitchen glipiZIDE (GLUCOTROL XL) 5 MG 24 hr tablet TAKE 1 TABLET BY MOUTH TWICE A DAY 180 tablet 1  . glucose blood test strip Use as instructed to check sugar 2 times daily 200 each 5  . hydrochlorothiazide (HYDRODIURIL) 25 MG tablet Take 1 tablet (25 mg total) by mouth daily. 90 tablet 3  . Insulin Detemir (LEVEMIR FLEXTOUCH) 100 UNIT/ML Pen Inject 20 Units into the skin daily at 10 pm. 15 mL 11  . Insulin Pen Needle (NOVOFINE PLUS) 32G X 4 MM MISC Use 1x a day 100 each 11  . losartan (COZAAR) 100 MG tablet Take 1 tablet (100 mg total) by mouth daily. 90 tablet 3  . metFORMIN (GLUCOPHAGE) 500 MG tablet TAKE 2 TABLETS (1,000 MG TOTAL) BY MOUTH 2 (TWO) TIMES DAILY WITH A MEAL. 360 tablet 0  . metoprolol tartrate (LOPRESSOR) 50 MG tablet Take 1 tablet (50 mg total) by mouth 2 (two) times daily. 180 tablet 3  . pravastatin (PRAVACHOL) 40 MG tablet Take 40 mg by mouth daily.  3  . traZODone (DESYREL) 50 MG tablet Take 1 tablet (50 mg total) by mouth at bedtime as needed for sleep. 30 tablet 3  . hydrALAZINE (APRESOLINE) 25 MG tablet TAKE 1 TABLET BY  MOUTH TWICE A DAY (Patient not taking: Reported on 06/08/2018) 60 tablet 5  . latanoprost (XALATAN) 0.005 % ophthalmic solution Place 1 drop into both eyes at bedtime.  5  . nitroGLYCERIN (NITROSTAT) 0.4 MG SL tablet PLACE ONE TABLET UNDER THE TONGUE EVERY 5 MINUTES X3 DOSES AS NEEDED FOR CHEST PAIN (Patient not taking: Reported on 06/08/2018) 75 tablet 1  . spironolactone (ALDACTONE) 25 MG tablet Take 0.5 tablets (12.5 mg total) by mouth daily. (Patient not taking: Reported on 06/08/2018) 90 tablet 3   No current facility-administered medications for this encounter.     Physical Findings:  height is 5\' 10"  (1.778 m) and weight is 250 lb (113.4 kg). His oral  temperature is 98.2 F (36.8 C). His blood pressure is 179/93 (abnormal) and his pulse is 64. His respiration is 18 and oxygen saturation is 99%.  Pain Assessment Pain Score: 0-No pain/10 In general this is a well appearing African-American male in no acute distress.  He's alert and oriented x4 and appropriate throughout the examination. Cardiopulmonary assessment is negative for acute distress and he exhibits normal effort.   Lab Findings: Lab Results  Component Value Date   WBC 6.5 05/18/2017   HGB 13.8 05/18/2017   HCT 42.4 05/18/2017   MCV 85.1 05/18/2017   PLT 216 05/18/2017     Radiographic Findings: No results found.  Impression/Plan: 1. 68 y.o.gentleman with intermediate risk, Stage T1c adenocarcinoma of the prostate with Gleason Score of 3+4, and PSA of 5.5.   He will continue to follow up with urology for ongoing PSA determinations and has an appointment scheduled with Dr. Alinda Money on 08/22/18. He understands what to expect with regards to PSA monitoring going forward. I will look forward to following his response to treatment via correspondence with urology, and would be happy to continue to participate in his care if clinically indicated. I talked to the patient about what to expect in the future, including his risk for erectile dysfunction and rectal bleeding. I encouraged him to call or return to the office if he has any questions regarding his previous radiation or possible radiation side effects. He was comfortable with this plan and will follow up as needed.    Ronald Johns, PA-C

## 2018-06-15 ENCOUNTER — Other Ambulatory Visit: Payer: Self-pay

## 2018-06-15 ENCOUNTER — Telehealth: Payer: Self-pay | Admitting: Pharmacist

## 2018-06-15 ENCOUNTER — Ambulatory Visit (INDEPENDENT_AMBULATORY_CARE_PROVIDER_SITE_OTHER): Payer: Federal, State, Local not specified - PPO | Admitting: Cardiology

## 2018-06-15 ENCOUNTER — Encounter: Payer: Self-pay | Admitting: Cardiology

## 2018-06-15 VITALS — BP 140/82 | HR 70 | Ht 70.0 in | Wt 250.8 lb

## 2018-06-15 DIAGNOSIS — R51 Headache: Secondary | ICD-10-CM | POA: Diagnosis not present

## 2018-06-15 DIAGNOSIS — R519 Headache, unspecified: Secondary | ICD-10-CM

## 2018-06-15 MED ORDER — EVOLOCUMAB 140 MG/ML ~~LOC~~ SOAJ: 1.0000 "pen " | SUBCUTANEOUS | 11 refills | Status: DC

## 2018-06-15 NOTE — Patient Instructions (Signed)
Medication Instructions:  none If you need a refill on your cardiac medications before your next appointment, please call your pharmacy.   Lab work: none If you have labs (blood work) drawn today and your tests are completely normal, you will receive your results only by: Marland Kitchen MyChart Message (if you have MyChart) OR . A paper copy in the mail If you have any lab test that is abnormal or we need to change your treatment, we will call you to review the results.  Testing/Procedures: none  Follow-Up:Referral to Neurology  At [facility], you and your health needs are our priority.  As part of our continuing mission to provide you with exceptional heart care, we have created designated Provider Care Teams.  These Care Teams include your primary Cardiologist (physician) and Advanced Practice Providers (APPs -  Physician Assistants and Nurse Practitioners) who all work together to provide you with the care you need, when you need it. You will need a follow up appointment in 6 months.  Please call our office 2 months in advance to schedule this appointment.  You may see [name], MD or one of the following Advanced Practice Providers on your designated Care Team:   Truitt Merle, NP [name], NP . Kathyrn Drown, NP  Any Other Special Instructions Will Be Listed Below (If Applicable).

## 2018-06-15 NOTE — Telephone Encounter (Addendum)
Pt seen by Lyda Jester in clinic today and inquired about PCSK9i. Pt was seen in lipid clinic last fall and approved for Repatha, however copay was cost prohibitive with his [employer] insurance. Looks as though pt is now on his wife's commercial BCBS FEP plan - checked with pharmacy and copay is $65 per month. Pt should qualify for $5 copay card however was unable to activate this online - pt will need to call them.  LMOM for pt to discuss this.

## 2018-06-15 NOTE — Progress Notes (Signed)
06/15/2018 [name]   1950/11/18  [id number]  Primary Physician Ronald Maw, MD Primary Cardiologist: Ronald Grooms, MD  Electrophysiologist: None   Reason for Visit/CC: Follow-up for hypertension  HPI: [name] presents to clinic for F/u for HTN. He is followed by [name] and has a history of known CAD s/p DES to LCx 11/2014, OM1 not stenteddue to tortuosity, HTN, HLD, OSA not on C-PAPand PAT/PAF (not on anticoagulation due to low burden).  He also has a recent diagnosis of prostate cancer and is now receiving radiation.  He was recently seen by [name] April 18, 2018 for routine follow-up.  He denied any anginal symptomatology but his blood pressure was elevated at 178/101.  [name] elected to increase his metoprolol to 50 mg twice daily with instruction to consider addition of Spironolactone if his blood pressure remains elevated.  I evaluated him on February 18 for follow-up for hypertension but he also needed clearance for arthroscopic knee surgery.  From a cardiac symptom standpoint, he denied any anginal symptomatology.  No exertional chest pain or dyspnea.  He reported he was able to complete greater than 4 METs of physical activity, walk up a flight of stairs without any exertional symptoms.  His EKG showed normal sinus rhythm with first-degree AV block and nonspecific T wave abnormalities, but not significantly changed compared to prior EKGs.  From a surgical standpoint, I felt that he was okay to proceed with knee surgery and he was cleared.  His blood pressure however was also still slightly elevated. As recommended previously by [name], I advised that we add spironolactone. Pt instructed to start 12.5 mg and follow-up again to f/u in the hypertension clinic for repeat assessment.  Pt opted not to f/u in HTN clinic and scheduled with me instead. He states that he opted not to start spironolactone. Did not want to start a new medication. Wants to  come off of some of his meds and not add. Very hesitant to start new meds due to side effects and has a history of not following medical advise. His BP however is better today compared to prior office visits. BP today is 140/82 but still not at goal given his DM. He denies CP. Once he has his knee surgery next week, he is hoping to start exercising again. He has tried to watch his diet and salt intake.   Also, pt complains of frequent HAs. He is concerned given family h/o cerebral aneurysm in sister. I explained to him that his HAs may be due to high BP. Also ? Tension HAs. I recommended he try Tylenol. He is asking for referal to neurology. He currently is w/o PCP. I will refer.    Current Meds  Medication Sig  . Blood Glucose Monitoring Suppl (RELION PREMIER BLU MONITOR) DEVI 1 Device by Does not apply route daily.  . Colchicine 0.6 MG CAPS Take 0.6 mg by mouth as needed.  Marland Kitchen esomeprazole (NEXIUM) 40 MG capsule Take 40 mg by mouth daily as needed (heartburn or acid reflux).  . fluticasone (FLONASE) 50 MCG/ACT nasal spray Place 1 spray into both nostrils daily as needed.  Marland Kitchen glipiZIDE (GLUCOTROL XL) 5 MG 24 hr tablet TAKE 1 TABLET BY MOUTH TWICE A DAY  . glucose blood test strip Use as instructed to check sugar 2 times daily  . hydrALAZINE (APRESOLINE) 25 MG tablet TAKE 1 TABLET BY MOUTH TWICE A DAY  . hydrochlorothiazide (HYDRODIURIL) 25 MG tablet Take  1 tablet (25 mg total) by mouth daily.  . indomethacin (INDOCIN) 50 MG capsule Take 50 mg by mouth as needed.  . Insulin Detemir (LEVEMIR FLEXTOUCH) 100 UNIT/ML Pen Inject 20 Units into the skin daily at 10 pm.  . Insulin Pen Needle (NOVOFINE PLUS) 32G X 4 MM MISC Use 1x a day  . latanoprost (XALATAN) 0.005 % ophthalmic solution Place 1 drop into both eyes at bedtime.  Marland Kitchen losartan (COZAAR) 100 MG tablet Take 1 tablet (100 mg total) by mouth daily.  . metFORMIN (GLUCOPHAGE) 500 MG tablet TAKE 2 TABLETS (1,000 MG TOTAL) BY MOUTH 2 (TWO) TIMES DAILY  WITH A MEAL.  . metoprolol tartrate (LOPRESSOR) 50 MG tablet Take 1 tablet (50 mg total) by mouth 2 (two) times daily.  . nitroGLYCERIN (NITROSTAT) 0.4 MG SL tablet PLACE ONE TABLET UNDER THE TONGUE EVERY 5 MINUTES X3 DOSES AS NEEDED FOR CHEST PAIN  . pravastatin (PRAVACHOL) 40 MG tablet Take 40 mg by mouth daily.   Allergies  Allergen Reactions  . Statins Other (See Comments)    REACTION: joint pain Lipitor- headaches Has also tried Livalo, pravachol, zetia, crestor, welchol  . Imdur [Isosorbide Dinitrate]     Headache - "violent"   Past Medical History:  Diagnosis Date  . Adenomatous colon polyp 2011  . Anemia   . Angina pectoris ([place]) 05/01/2015   med rx for 95% OM (u/a to access due to tortuosity), 95% D1 and other moderate CAD at cath  . Arthritis    "back, knees" (05/01/2015)  . Chest pain    pleuritic  . Chronic lower back pain   . Coronary artery disease   . Depression   . GERD (gastroesophageal reflux disease)   . HTN (hypertension)   . Hyperlipidemia   . Obstructive sleep apnea    noncompliant with CPAP  . Osteoarthritis   . Pneumonia ~ 2014 X 1  . Prostate cancer ([place])   . Refusal of blood transfusions as patient is Jehovah's Witness   . Type II or unspecified type diabetes mellitus without mention of complication, not stated as uncontrolled    Family History  Problem Relation Age of Onset  . Coronary artery disease Other        CABG  . Coronary artery disease Mother   . Stroke Mother   . Heart disease Mother   . Diabetes Mother   . Other Father   . Diabetes Sister   . Diabetes Maternal Uncle        x2  . Alcohol abuse Other   . Diabetes Paternal Uncle        x2  . Colon cancer Paternal Uncle   . Colon polyps Neg Hx   . Esophageal cancer Neg Hx    Past Surgical History:  Procedure Laterality Date  . CARDIAC CATHETERIZATION  2004   patent coronary arteries  . CARDIAC CATHETERIZATION  05/01/2015   "tried to put stent in but couldn't"  . CARDIAC  CATHETERIZATION N/A 05/01/2015   Procedure: Left Heart Cath and Coronary Angiography;  Surgeon: Belva Crome, MD; LAD 60%, oD1 90%, pD1 70%, D2 70%, CFX patent stent, 30% distal to prev stent, pRCA 20%, OM1 90/95%, NL LV  . CARDIAC CATHETERIZATION N/A 05/01/2015   Procedure: Coronary Stent Intervention;  Surgeon: Belva Crome, MD;  Unsuccessful PCI OM due to tortuosity  . CARDIAC CATHETERIZATION N/A 11/06/2014   Procedure: Left Heart Cath and Coronary Angiography;  Surgeon: Belva Crome, MD;  [facility]  INVASIVE CV LAB;  Service: Cardiovascular;  Laterality: N/A;  . CLOSED REDUCTION SHOULDER DISLOCATION Right ~ 1975   "& reattached muscle"  . COLONOSCOPY W/ BIOPSIES AND POLYPECTOMY  X 2  . ESOPHAGOGASTRODUODENOSCOPY ENDOSCOPY    . PILONIDAL CYST EXCISION     Social History   Socioeconomic History  . Marital status: Married    Spouse name: [name]  . Number of children: 4  . Years of education: Not on file  . Highest education level: Not on file  Occupational History  . Occupation: Retired    Fish farm manager: UNEMPLOYED  Social Needs  . Financial resource strain: Not on file  . Food insecurity:    Worry: Not on file    Inability: Not on file  . Transportation needs:    Medical: No    Non-medical: No  Tobacco Use  . Smoking status: Former Smoker    Packs/day: 0.50    Years: 10.00    Pack years: 5.00    Types: Cigarettes    Last attempt to quit: 04/06/1983    Years since quitting: 35.2  . Smokeless tobacco: Never Used  Substance and Sexual Activity  . Alcohol use: Yes    Alcohol/week: 0.0 standard drinks    Comment: `/26/2017 "might drink a beer q couple months mostly; summertime I might drink 2-3 beers/week"  . Drug use: No  . Sexual activity: Not Currently  Lifestyle  . Physical activity:    Days per week: Not on file    Minutes per session: Not on file  . Stress: Not on file  Relationships  . Social connections:    Talks on phone: Not on file    Gets together: Not on  file    Attends religious service: Not on file    Active member of club or organization: Not on file    Attends meetings of clubs or organizations: Not on file    Relationship status: Not on file  . Intimate partner violence:    Fear of current or ex partner: No    Emotionally abused: No    Physically abused: No    Forced sexual activity: No  Other Topics Concern  . Not on file  Social History Narrative   Lives in [place] with spouse Jimie Kuwahara).  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     Lipid Panel     Component Value Date/Time   CHOL 164 06/19/2015 1624   TRIG 61.0 06/19/2015 1624   HDL 38.40 (L) 06/19/2015 1624   CHOLHDL 4 06/19/2015 1624   VLDL 12.2 06/19/2015 1624   LDLCALC 114 (H) 06/19/2015 1624   LDLDIRECT 117 (H) 01/17/2018 1456   LDLDIRECT 168.0 12/18/2012 1340    Review of Systems: General: negative for chills, fever, night sweats or weight changes.  Cardiovascular: negative for chest pain, dyspnea on exertion, edema, orthopnea, palpitations, paroxysmal nocturnal dyspnea or shortness of breath Dermatological: negative for rash Respiratory: negative for cough or wheezing Urologic: negative for hematuria Abdominal: negative for nausea, vomiting, diarrhea, bright red blood per rectum, melena, or hematemesis Neurologic: negative for visual changes, syncope, or dizziness All other systems reviewed and are otherwise negative except as noted above.   Physical Exam:  Height 5\' 10"  (1.778 m), weight 250 lb 12.8 oz (113.8 kg).  General appearance: alert, cooperative and moderately obese Neck: no carotid bruit and no JVD Lungs: clear to auscultation bilaterally Heart: regular rate and rhythm, S1, S2  normal, no murmur, click, rub or gallop Extremities: extremities normal, atraumatic, no cyanosis or edema Pulses: 2+ and symmetric Skin: Skin color, texture, turgor normal. No rashes or lesions Neurologic: Grossly  normal  EKG not peformed -- personally reviewed   ASSESSMENT AND PLAN:   1. HTN: improved compared to prior OVs, at 140/82 today but still not at goal, given his DM. BP goal is < 180/30. He was instructed to start spironolactone at his last OV but refused. Also refuses addition of other medications. Wants to avoid further titration of current meds. He is very resistant to medical advise. We discussed the harm of poorly controlled HTN, including increased risk of MI, stroke and CKD. He understands these risk. We discussed non pharmacological ways to lower BP including, diet, exercise and weight loss. Pt also advised to reduce dietary intake of salt.    2. CAD: stable w/o CP. Continue medical therapy.   3. DM: followed by endocrinology.   4. Knee Pain: needs left arthroscopic knee surgery. He was cleared to proceed from a cardiac standpoint at last OV. Surgery scheduled for next week.   5. Headaches: pt complains of frequent HAs. He is concerned given family h/o cerebral aneurysm in sister. I explained to him that his HAs may be due to high BP. Also ? Tension HAs. I recommended he try Tylenol. He is asking for referal to neurology. He currently is w/o PCP. I will refer.    Follow-Up w/ [name] in 6 months.   Beryl Hornberger Ladoris Gene, MHS [facility] 06/15/2018 11:04 AM

## 2018-06-16 NOTE — Telephone Encounter (Signed)
Pt returned call to clinic - provided him with # to call Repatha Ready to activate copay card. He will call clinic once he starts his injections.

## 2018-06-20 ENCOUNTER — Encounter: Payer: Self-pay | Admitting: Neurology

## 2018-06-29 ENCOUNTER — Telehealth: Payer: Self-pay | Admitting: Pharmacist

## 2018-06-29 DIAGNOSIS — E785 Hyperlipidemia, unspecified: Secondary | ICD-10-CM

## 2018-06-29 MED ORDER — EVOLOCUMAB 140 MG/ML ~~LOC~~ SOAJ: 1.0000 "pen " | SUBCUTANEOUS | 11 refills | Status: DC

## 2018-06-29 NOTE — Telephone Encounter (Signed)
Called pt to follow up on Repatha - [facility], but his insurance is requesting that he fill future refills with [facility].   He has activated copay card and I have added info to his new rx sent in: ID: [id number] BIN: [id number] GRP: [id number] PCN: [id number]  Follow up labs scheduled in June.

## 2018-07-05 ENCOUNTER — Ambulatory Visit: Payer: Federal, State, Local not specified - PPO | Admitting: Cardiology

## 2018-07-14 ENCOUNTER — Telehealth: Payer: Self-pay | Admitting: Radiation Oncology

## 2018-07-14 NOTE — Telephone Encounter (Signed)
Received voicemail message from patient requesting return call. Phoned patient back to inquire. Patient asking if he can sent a MyChart message to [employer], PA-C. Explained he can send one to Dr. Tammi Klippel and this RN will redirect it. Patient verbalized understanding and expressed appreciation for return call.

## 2018-07-16 ENCOUNTER — Other Ambulatory Visit: Payer: Self-pay | Admitting: Internal Medicine

## 2018-07-18 ENCOUNTER — Other Ambulatory Visit: Payer: Self-pay | Admitting: Internal Medicine

## 2018-07-25 ENCOUNTER — Encounter (HOSPITAL_COMMUNITY): Payer: Self-pay

## 2018-07-25 ENCOUNTER — Other Ambulatory Visit: Payer: Self-pay

## 2018-07-25 ENCOUNTER — Ambulatory Visit (HOSPITAL_COMMUNITY)
Admission: EM | Admit: 2018-07-25 | Discharge: 2018-07-25 | Disposition: A | Discharge status: Home / Self Care | Payer: Federal, State, Local not specified - PPO | Attending: Family Medicine | Admitting: Family Medicine

## 2018-07-25 DIAGNOSIS — R059 Cough, unspecified: Secondary | ICD-10-CM

## 2018-07-25 DIAGNOSIS — R05 Cough: Secondary | ICD-10-CM

## 2018-07-25 DIAGNOSIS — I10 Essential (primary) hypertension: Secondary | ICD-10-CM

## 2018-07-25 DIAGNOSIS — R062 Wheezing: Secondary | ICD-10-CM

## 2018-07-25 MED ORDER — AZITHROMYCIN 250 MG PO TABS: 250.0000 mg | ORAL_TABLET | Freq: Every day | ORAL | 0 refills | Status: DC

## 2018-07-25 MED ORDER — FLUTICASONE PROPIONATE 50 MCG/ACT NA SUSP: 1.0000 | Freq: Every day | NASAL | 2 refills | Status: DC | PRN

## 2018-07-25 MED ORDER — ALBUTEROL SULFATE HFA 108 (90 BASE) MCG/ACT IN AERS: 1.0000 | INHALATION_SPRAY | Freq: Four times a day (QID) | RESPIRATORY_TRACT | 0 refills | Status: DC | PRN

## 2018-07-25 NOTE — ED Triage Notes (Signed)
Pt presents with productive congestive cough with yellow mucus for over a week.

## 2018-07-26 ENCOUNTER — Encounter: Payer: Self-pay | Admitting: Radiation Oncology

## 2018-07-26 NOTE — ED Provider Notes (Signed)
[facility]   [id number] 07/25/18 Arrival Time: 6546  ASSESSMENT & PLAN:  1. Cough   2. Wheezing   3. Uncontrolled hypertension    Will defer imaging at this time. Discussed.  Meds ordered this encounter  Medications  . azithromycin (ZITHROMAX) 250 MG tablet    Sig: Take 1 tablet (250 mg total) by mouth daily. Take first 2 tablets together, then 1 every day until finished.    Dispense:  6 tablet    Refill:  0  . albuterol (VENTOLIN HFA) 108 (90 Base) MCG/ACT inhaler    Sig: Inhale 1-2 puffs into the lungs every 6 (six) hours as needed for wheezing or shortness of breath.    Dispense:  1 Inhaler    Refill:  0  . fluticasone (FLONASE) 50 MCG/ACT nasal spray    Sig: Place 1 spray into both nostrils daily as needed.    Dispense:  16 g    Refill:  2   Agrees to schedule f/u with PCP re: HTN.  Follow-up Information    Schedule an appointment as soon as possible for a visit  with [name], MD.   Specialty:  Family Medicine Contact information: [place] [address] [phone]        [place].   Specialty:  Urgent Care Why:  As needed. Contact information: [place] [phone]         OTC symptom care as needed. Ensure adequate fluid intake and rest. May f/u with PCP or here as needed.  Reviewed expectations re: course of current medical issues. Questions answered. Outlined signs and symptoms indicating need for more acute intervention. Patient verbalized understanding. After Visit Summary given.   SUBJECTIVE: History from: patient.  [name] is a 68 y.o. male who presents with complaint of nasal congestion, post-nasal drainage, and a persistent dry cough; without sore throat. Cough is bothering him the post; now reports mild "yellow mucous" production. Onset gradual, 1-2 weeks ago; without fatigue and without body aches.  SOB: none. Wheezing: none. Fever: no. Overall normal PO intake without n/v. Known sick contacts: no. No specific or significant aggravating or alleviating factors reported. OTC treatment: none reported. Notices elevated BP. Has not taken medications today.  Social History   Tobacco Use  Smoking Status Former Smoker  . Packs/day: 0.50  . Years: 10.00  . Pack years: 5.00  . Types: Cigarettes  . Last attempt to quit: 04/06/1983  . Years since quitting: 35.3  Smokeless Tobacco Never Used    ROS: As per HPI. All other systems negative.   OBJECTIVE:  Vitals:   07/25/18 1644  BP: (!) 178/100  Pulse: 68  Resp: 17  Temp: 97.9 F (36.6 C)  TempSrc: Oral  SpO2: 97%     General appearance: alert; appears fatigued HEENT: nasal congestion; clear runny nose; throat irritation secondary to post-nasal drainage Neck: supple without LAD CV: RRR Lungs: unlabored respirations, symmetrical air entry with mild bilateral expiratory wheezing; cough: mild Abd: soft Ext: no LE edema Skin: warm and dry Psychological: alert and cooperative; normal mood and affect   Allergies  Allergen Reactions  . Statins Other (See Comments)    REACTION: joint pain Lipitor- headaches Has also tried Livalo, pravachol, zetia, crestor, welchol  . Imdur [Isosorbide Dinitrate]     Headache - "violent"    Past Medical History:  Diagnosis Date  . Adenomatous colon polyp 2011  .  Anemia   . Angina pectoris ([place]) 05/01/2015   med rx for 95% OM (u/a to access due to tortuosity), 95% D1 and other moderate CAD at cath  . Arthritis    "back, knees" (05/01/2015)  . Chest pain    pleuritic  . Chronic lower back pain   . Coronary artery disease   . Depression   . GERD (gastroesophageal reflux disease)   . HTN (hypertension)   . Hyperlipidemia   . Obstructive sleep apnea    noncompliant with CPAP  . Osteoarthritis   . Pneumonia ~ 2014 X 1  . Prostate cancer ([place])   . Refusal of blood transfusions as patient  is Jehovah's Witness   . Type II or unspecified type diabetes mellitus without mention of complication, not stated as uncontrolled    Family History  Problem Relation Age of Onset  . Coronary artery disease Other        CABG  . Coronary artery disease Mother   . Stroke Mother   . Heart disease Mother   . Diabetes Mother   . Other Father   . Diabetes Sister   . Diabetes Maternal Uncle        x2  . Alcohol abuse Other   . Diabetes Paternal Uncle        x2  . Colon cancer Paternal Uncle   . Colon polyps Neg Hx   . Esophageal cancer Neg Hx    Social History   Socioeconomic History  . Marital status: Married    Spouse name: [name]  . Number of children: 4  . Years of education: Not on file  . Highest education level: Not on file  Occupational History  . Occupation: Retired    Fish farm manager: UNEMPLOYED  Social Needs  . Financial resource strain: Not on file  . Food insecurity:    Worry: Not on file    Inability: Not on file  . Transportation needs:    Medical: No    Non-medical: No  Tobacco Use  . Smoking status: Former Smoker    Packs/day: 0.50    Years: 10.00    Pack years: 5.00    Types: Cigarettes    Last attempt to quit: 04/06/1983    Years since quitting: 35.3  . Smokeless tobacco: Never Used  Substance and Sexual Activity  . Alcohol use: Yes    Alcohol/week: 0.0 standard drinks    Comment: `/26/2017 "might drink a beer q couple months mostly; summertime I might drink 2-3 beers/week"  . Drug use: No  . Sexual activity: Not Currently  Lifestyle  . Physical activity:    Days per week: Not on file    Minutes per session: Not on file  . Stress: Not on file  Relationships  . Social connections:    Talks on phone: Not on file    Gets together: Not on file    Attends religious service: Not on file    Active member of club or organization: Not on file    Attends meetings of clubs or organizations: Not on file    Relationship status: Not on file  . Intimate  partner violence:    Fear of current or ex partner: No    Emotionally abused: No    Physically abused: No    Forced sexual activity: No  Other Topics Concern  . Not on file  Social History Narrative   Lives in [place] with spouse Doyle Kunath).  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           Vanessa Kick, MD 07/26/18 [phone]

## 2018-07-27 ENCOUNTER — Other Ambulatory Visit: Payer: Self-pay | Admitting: Internal Medicine

## 2018-08-01 ENCOUNTER — Encounter: Payer: Self-pay | Admitting: Radiation Oncology

## 2018-08-02 ENCOUNTER — Ambulatory Visit: Payer: Federal, State, Local not specified - PPO

## 2018-08-02 ENCOUNTER — Telehealth: Payer: Self-pay | Admitting: Radiation Oncology

## 2018-08-02 DIAGNOSIS — R8290 Unspecified abnormal findings in urine: Secondary | ICD-10-CM

## 2018-08-02 NOTE — Telephone Encounter (Signed)
Unable to forward message to patient via MyChart. Phoned patient and read the following to AVW:PVXYIA like the typical bladder irritation (radiation cystitis) expected after prostate radiation but would be happy to have him come by the cancer center to leave a urine sample to r/o infection since there appears to be a strong odor, urgency and some incontinence- especially if these have gotten worse since completion of radiation in 04/2018 instead of better. UTI is easy enough to rule out with a simple test. If no infection, I would be happy to send him a Rx for Flomax to see if this helps manage his LUTS prior to his f/u with Dr. Alinda Money on 08/22/18. Regarding the concern about PSA level, the 5.5 PSA result is from 09/2017, prior to starting treatment and to my knowledge has not yet been checked since completing radiation. We do not expect the PSA to go to 0, but it will gradually decrease over the next 12-18 months down to a nice low level, likely less than 1- this will be monitored by Dr. Alinda Money every 3-4 months for the first year and then twice a year thereafter. I am not sure what to make of the inability to stand without assistance as this would not be related to the radiation treatments. Probably needs to be seen with his PCP to rule out degenerative disc disease in his back or other common causes for LE weakness, back pain. He needs to be seen by his PCP or through the ER immediately if he is running fever and having weakness.  Patient agreed to present tomorrow at 1300 to provide a urine sample. Patient understands the result will take 24-48 hour to come in and this RN will call at that point to provide further directions.   Otherwise, patient verbalized understanding of all reviewed.

## 2018-08-03 ENCOUNTER — Other Ambulatory Visit: Payer: Self-pay

## 2018-08-03 ENCOUNTER — Ambulatory Visit
Admission: RE | Admit: 2018-08-03 | Discharge: 2018-08-03 | Disposition: A | Discharge status: Home / Self Care | Payer: Federal, State, Local not specified - PPO | Source: Ambulatory Visit | Attending: Radiation Oncology | Admitting: Radiation Oncology

## 2018-08-03 DIAGNOSIS — C61 Malignant neoplasm of prostate: Secondary | ICD-10-CM | POA: Diagnosis not present

## 2018-08-03 DIAGNOSIS — R8290 Unspecified abnormal findings in urine: Secondary | ICD-10-CM

## 2018-08-03 LAB — URINALYSIS, COMPLETE (UACMP) WITH MICROSCOPIC
Bacteria, UA: NONE SEEN
Bilirubin Urine: NEGATIVE
Glucose, UA: NEGATIVE mg/dL
Hgb urine dipstick: NEGATIVE
Ketones, ur: NEGATIVE mg/dL
Leukocytes,Ua: NEGATIVE
Nitrite: NEGATIVE
Protein, ur: NEGATIVE mg/dL
Specific Gravity, Urine: 1.021 (ref 1.005–1.030)
pH: 5 (ref 5.0–8.0)

## 2018-08-04 ENCOUNTER — Other Ambulatory Visit: Payer: Self-pay | Admitting: Urology

## 2018-08-04 ENCOUNTER — Telehealth: Payer: Self-pay | Admitting: Radiation Oncology

## 2018-08-04 LAB — URINE CULTURE: Culture: NO GROWTH

## 2018-08-04 MED ORDER — TAMSULOSIN HCL 0.4 MG PO CAPS: 0.4000 mg | ORAL_CAPSULE | Freq: Every day | ORAL | 1 refills | Status: DC

## 2018-08-04 NOTE — Telephone Encounter (Signed)
Phoned patient as directed by Freeman Caldron, PA-C. Explained his urine culture did not grow any bacteria to indicate infection so his LUTS are likely secondary to bladder irritation from his recent radiotherapy. Explained that PA Bruning has offered to send in a script for Flomax to provide some relief until his follow up with Dr. Alinda Money. Patient reports taking Myrbetriq but not flomax in the past. Patient wishes to try flomax. Explained he should take flomax with supper and be mindful this medication can lower his BP. Also, explained if the medication works well for him and he would like future refills he will have to get those at Dr. Lynne Logan discretion. Patient verbalized understanding. [facility]. Instructed patient to check Saturday to ensure the medication had been filled before heading over to his pharmacy. Encouraged patient to phone with future needs. Patient verbalized understanding of all reviewed and expressed appreciation for the return call.

## 2018-08-04 NOTE — Progress Notes (Signed)
Please call patient with normal result.  Thanks. MM 

## 2018-08-22 DIAGNOSIS — R3915 Urgency of urination: Secondary | ICD-10-CM | POA: Diagnosis not present

## 2018-08-22 DIAGNOSIS — N3941 Urge incontinence: Secondary | ICD-10-CM | POA: Diagnosis not present

## 2018-08-22 DIAGNOSIS — C61 Malignant neoplasm of prostate: Secondary | ICD-10-CM | POA: Diagnosis not present

## 2018-09-04 ENCOUNTER — Ambulatory Visit: Payer: Federal, State, Local not specified - PPO | Admitting: Internal Medicine

## 2018-09-04 ENCOUNTER — Ambulatory Visit: Payer: Federal, State, Local not specified - PPO | Admitting: Neurology

## 2018-09-06 ENCOUNTER — Other Ambulatory Visit: Payer: Self-pay | Admitting: Orthopedic Surgery

## 2018-09-07 ENCOUNTER — Encounter (HOSPITAL_BASED_OUTPATIENT_CLINIC_OR_DEPARTMENT_OTHER): Payer: Self-pay | Admitting: *Deleted

## 2018-09-07 ENCOUNTER — Other Ambulatory Visit: Payer: Self-pay

## 2018-09-11 ENCOUNTER — Other Ambulatory Visit (HOSPITAL_COMMUNITY)
Admission: RE | Admit: 2018-09-11 | Discharge: 2018-09-11 | Disposition: A | Discharge status: Home / Self Care | Payer: Federal, State, Local not specified - PPO | Source: Ambulatory Visit | Attending: Orthopedic Surgery | Admitting: Orthopedic Surgery

## 2018-09-11 ENCOUNTER — Other Ambulatory Visit: Payer: Self-pay

## 2018-09-11 ENCOUNTER — Encounter (HOSPITAL_BASED_OUTPATIENT_CLINIC_OR_DEPARTMENT_OTHER)
Admission: RE | Admit: 2018-09-11 | Discharge: 2018-09-11 | Disposition: A | Discharge status: Home / Self Care | Payer: Federal, State, Local not specified - PPO | Source: Ambulatory Visit | Attending: Orthopedic Surgery | Admitting: Orthopedic Surgery

## 2018-09-11 DIAGNOSIS — Z01812 Encounter for preprocedural laboratory examination: Secondary | ICD-10-CM | POA: Insufficient documentation

## 2018-09-11 DIAGNOSIS — Z1159 Encounter for screening for other viral diseases: Secondary | ICD-10-CM | POA: Insufficient documentation

## 2018-09-11 LAB — BASIC METABOLIC PANEL
Anion gap: 11 (ref 5–15)
BUN: 20 mg/dL (ref 8–23)
CO2: 25 mmol/L (ref 22–32)
Calcium: 9.4 mg/dL (ref 8.9–10.3)
Chloride: 107 mmol/L (ref 98–111)
Creatinine, Ser: 1.17 mg/dL (ref 0.61–1.24)
GFR calc Af Amer: >60 mL/min (ref >60)
GFR calc non Af Amer: >60 mL/min (ref >60)
Glucose, Bld: 127 mg/dL — ABNORMAL HIGH (ref 70–99)
Potassium: 4.1 mmol/L (ref 3.5–5.1)
Sodium: 143 mmol/L (ref 135–145)

## 2018-09-11 NOTE — Progress Notes (Signed)
Gatorade 2 drink and surgical soap given with instructions, pt verbalized understanding. 

## 2018-09-12 LAB — NOVEL CORONAVIRUS, NAA (HOSP ORDER, SEND-OUT TO REF LAB; TAT 18-24 HRS): SARS-CoV-2, NAA: NOT DETECTED

## 2018-09-14 ENCOUNTER — Ambulatory Visit (HOSPITAL_BASED_OUTPATIENT_CLINIC_OR_DEPARTMENT_OTHER): Payer: Federal, State, Local not specified - PPO | Admitting: Anesthesiology

## 2018-09-14 ENCOUNTER — Other Ambulatory Visit: Payer: Self-pay

## 2018-09-14 ENCOUNTER — Ambulatory Visit (HOSPITAL_BASED_OUTPATIENT_CLINIC_OR_DEPARTMENT_OTHER)
Admission: RE | Admit: 2018-09-14 | Discharge: 2018-09-14 | Disposition: A | Discharge status: Home / Self Care | Payer: Federal, State, Local not specified - PPO | Attending: Orthopedic Surgery | Admitting: Orthopedic Surgery

## 2018-09-14 ENCOUNTER — Encounter (HOSPITAL_BASED_OUTPATIENT_CLINIC_OR_DEPARTMENT_OTHER): Payer: Self-pay | Admitting: *Deleted

## 2018-09-14 ENCOUNTER — Encounter (HOSPITAL_BASED_OUTPATIENT_CLINIC_OR_DEPARTMENT_OTHER)
Admission: RE | Disposition: A | Discharge status: Home / Self Care | Payer: Self-pay | Source: Home / Self Care | Attending: Orthopedic Surgery

## 2018-09-14 DIAGNOSIS — Z794 Long term (current) use of insulin: Secondary | ICD-10-CM | POA: Diagnosis not present

## 2018-09-14 DIAGNOSIS — S83232A Complex tear of medial meniscus, current injury, left knee, initial encounter: Secondary | ICD-10-CM | POA: Diagnosis present

## 2018-09-14 DIAGNOSIS — S83282A Other tear of lateral meniscus, current injury, left knee, initial encounter: Secondary | ICD-10-CM | POA: Insufficient documentation

## 2018-09-14 DIAGNOSIS — M545 Low back pain: Secondary | ICD-10-CM | POA: Insufficient documentation

## 2018-09-14 DIAGNOSIS — I4891 Unspecified atrial fibrillation: Secondary | ICD-10-CM | POA: Insufficient documentation

## 2018-09-14 DIAGNOSIS — M1990 Unspecified osteoarthritis, unspecified site: Secondary | ICD-10-CM | POA: Insufficient documentation

## 2018-09-14 DIAGNOSIS — Z8546 Personal history of malignant neoplasm of prostate: Secondary | ICD-10-CM | POA: Diagnosis not present

## 2018-09-14 DIAGNOSIS — Z86010 Personal history of colonic polyps: Secondary | ICD-10-CM | POA: Insufficient documentation

## 2018-09-14 DIAGNOSIS — E119 Type 2 diabetes mellitus without complications: Secondary | ICD-10-CM | POA: Diagnosis not present

## 2018-09-14 DIAGNOSIS — G4733 Obstructive sleep apnea (adult) (pediatric): Secondary | ICD-10-CM | POA: Diagnosis not present

## 2018-09-14 DIAGNOSIS — Z955 Presence of coronary angioplasty implant and graft: Secondary | ICD-10-CM | POA: Diagnosis not present

## 2018-09-14 DIAGNOSIS — I10 Essential (primary) hypertension: Secondary | ICD-10-CM | POA: Insufficient documentation

## 2018-09-14 DIAGNOSIS — X58XXXA Exposure to other specified factors, initial encounter: Secondary | ICD-10-CM | POA: Insufficient documentation

## 2018-09-14 DIAGNOSIS — K219 Gastro-esophageal reflux disease without esophagitis: Secondary | ICD-10-CM | POA: Insufficient documentation

## 2018-09-14 DIAGNOSIS — G8929 Other chronic pain: Secondary | ICD-10-CM | POA: Diagnosis not present

## 2018-09-14 DIAGNOSIS — S83242A Other tear of medial meniscus, current injury, left knee, initial encounter: Secondary | ICD-10-CM | POA: Diagnosis not present

## 2018-09-14 DIAGNOSIS — Z79899 Other long term (current) drug therapy: Secondary | ICD-10-CM | POA: Diagnosis not present

## 2018-09-14 DIAGNOSIS — F329 Major depressive disorder, single episode, unspecified: Secondary | ICD-10-CM | POA: Diagnosis not present

## 2018-09-14 DIAGNOSIS — E785 Hyperlipidemia, unspecified: Secondary | ICD-10-CM | POA: Insufficient documentation

## 2018-09-14 DIAGNOSIS — Z87891 Personal history of nicotine dependence: Secondary | ICD-10-CM | POA: Insufficient documentation

## 2018-09-14 DIAGNOSIS — I2510 Atherosclerotic heart disease of native coronary artery without angina pectoris: Secondary | ICD-10-CM | POA: Insufficient documentation

## 2018-09-14 HISTORY — DX: Complex tear of medial meniscus, current injury, left knee, initial encounter: S83.232A

## 2018-09-14 HISTORY — PX: KNEE ARTHROSCOPY WITH MEDIAL MENISECTOMY: SHX5651

## 2018-09-14 LAB — GLUCOSE, CAPILLARY
Glucose-Capillary: 140 mg/dL — ABNORMAL HIGH (ref 70–99)
Glucose-Capillary: 88 mg/dL (ref 70–99)

## 2018-09-14 SURGERY — ARTHROSCOPY, KNEE, WITH MEDIAL MENISCECTOMY
Anesthesia: General | Laterality: Left

## 2018-09-14 MED ORDER — SODIUM CHLORIDE 0.9 % IR SOLN
Status: DC | PRN
  Administered 2018-09-14: 6000 mL

## 2018-09-14 MED ORDER — ONDANSETRON HCL 4 MG/2ML IJ SOLN
INTRAMUSCULAR | Status: DC | PRN
  Administered 2018-09-14: 4 mg via INTRAVENOUS

## 2018-09-14 MED ORDER — PROPOFOL 10 MG/ML IV BOLUS
INTRAVENOUS | Status: AC
  Filled 2018-09-14: qty 20

## 2018-09-14 MED ORDER — MIDAZOLAM HCL 2 MG/2ML IJ SOLN
INTRAMUSCULAR | Status: AC
  Filled 2018-09-14: qty 2

## 2018-09-14 MED ORDER — CEFAZOLIN SODIUM-DEXTROSE 2-4 GM/100ML-% IV SOLN: 2.0000 g | INTRAVENOUS | Status: DC

## 2018-09-14 MED ORDER — FENTANYL CITRATE (PF) 100 MCG/2ML IJ SOLN
50.0000 ug | INTRAMUSCULAR | Status: DC | PRN
  Administered 2018-09-14 (×2): 50 ug via INTRAVENOUS

## 2018-09-14 MED ORDER — LACTATED RINGERS IV SOLN
INTRAVENOUS | Status: DC
  Administered 2018-09-14 (×2): via INTRAVENOUS

## 2018-09-14 MED ORDER — SCOPOLAMINE 1 MG/3DAYS TD PT72: 1.0000 | MEDICATED_PATCH | Freq: Once | TRANSDERMAL | Status: DC | PRN

## 2018-09-14 MED ORDER — BUPIVACAINE HCL (PF) 0.5 % IJ SOLN
INTRAMUSCULAR | Status: AC
  Filled 2018-09-14: qty 30

## 2018-09-14 MED ORDER — PROPOFOL 10 MG/ML IV BOLUS
INTRAVENOUS | Status: DC | PRN
  Administered 2018-09-14: 150 mg via INTRAVENOUS

## 2018-09-14 MED ORDER — CHLORHEXIDINE GLUCONATE 4 % EX LIQD: 60.0000 mL | Freq: Once | CUTANEOUS | Status: DC

## 2018-09-14 MED ORDER — LIDOCAINE 2% (20 MG/ML) 5 ML SYRINGE
INTRAMUSCULAR | Status: AC
  Filled 2018-09-14: qty 5

## 2018-09-14 MED ORDER — FENTANYL CITRATE (PF) 100 MCG/2ML IJ SOLN: 25.0000 ug | INTRAMUSCULAR | Status: DC | PRN

## 2018-09-14 MED ORDER — FENTANYL CITRATE (PF) 100 MCG/2ML IJ SOLN
INTRAMUSCULAR | Status: AC
  Filled 2018-09-14: qty 2

## 2018-09-14 MED ORDER — CEFAZOLIN SODIUM-DEXTROSE 2-4 GM/100ML-% IV SOLN
INTRAVENOUS | Status: AC
  Filled 2018-09-14: qty 100

## 2018-09-14 MED ORDER — MIDAZOLAM HCL 2 MG/2ML IJ SOLN
1.0000 mg | INTRAMUSCULAR | Status: DC | PRN
  Administered 2018-09-14: 1 mg via INTRAVENOUS

## 2018-09-14 MED ORDER — HYDROCODONE-ACETAMINOPHEN 5-325 MG PO TABS: 1.0000 | ORAL_TABLET | Freq: Four times a day (QID) | ORAL | 0 refills | Status: DC | PRN

## 2018-09-14 MED ORDER — ONDANSETRON HCL 4 MG/2ML IJ SOLN: 4.0000 mg | Freq: Once | INTRAMUSCULAR | Status: DC | PRN

## 2018-09-14 MED ORDER — OXYCODONE HCL 5 MG PO TABS: 5.0000 mg | ORAL_TABLET | Freq: Once | ORAL | Status: DC | PRN

## 2018-09-14 MED ORDER — BUPIVACAINE-EPINEPHRINE (PF) 0.5% -1:200000 IJ SOLN
INTRAMUSCULAR | Status: AC
  Filled 2018-09-14: qty 30

## 2018-09-14 MED ORDER — OXYCODONE HCL 5 MG/5ML PO SOLN: 5.0000 mg | Freq: Once | ORAL | Status: DC | PRN

## 2018-09-14 MED ORDER — LIDOCAINE HCL (CARDIAC) PF 100 MG/5ML IV SOSY
PREFILLED_SYRINGE | INTRAVENOUS | Status: DC | PRN
  Administered 2018-09-14: 100 mg via INTRAVENOUS

## 2018-09-14 MED ORDER — BUPIVACAINE-EPINEPHRINE 0.5% -1:200000 IJ SOLN
INTRAMUSCULAR | Status: DC | PRN
  Administered 2018-09-14: 20 mL

## 2018-09-14 MED ORDER — SENNA-DOCUSATE SODIUM 8.6-50 MG PO TABS: 2.0000 | ORAL_TABLET | Freq: Every day | ORAL | 1 refills | Status: DC

## 2018-09-14 SURGICAL SUPPLY — 40 items
BANDAGE ACE 6X5 VEL STRL LF (GAUZE/BANDAGES/DRESSINGS) ×2 IMPLANT
BANDAGE ESMARK 6X9 LF (GAUZE/BANDAGES/DRESSINGS) IMPLANT
BNDG ESMARK 6X9 LF (GAUZE/BANDAGES/DRESSINGS)
CLSR STERI-STRIP ANTIMIC 1/2X4 (GAUZE/BANDAGES/DRESSINGS) ×2 IMPLANT
COVER WAND RF STERILE (DRAPES) IMPLANT
CUFF TOURN SGL QUICK 34 (TOURNIQUET CUFF)
CUFF TRNQT CYL 34X4.125X (TOURNIQUET CUFF) IMPLANT
CUTTER TENSIONER SUT 2-0 0 FBW (INSTRUMENTS) IMPLANT
DISSECTOR  3.8MM X 13CM (MISCELLANEOUS) ×1
DISSECTOR 3.8MM X 13CM (MISCELLANEOUS) ×1 IMPLANT
DISSECTOR 4.0MM X 13CM (MISCELLANEOUS) IMPLANT
DRAPE ARTHROSCOPY W/POUCH 90 (DRAPES) ×2 IMPLANT
DRAPE IMP U-DRAPE 54X76 (DRAPES) ×2 IMPLANT
DRAPE U-SHAPE 47X51 STRL (DRAPES) ×2 IMPLANT
DURAPREP 26ML APPLICATOR (WOUND CARE) ×2 IMPLANT
ELECT MENISCUS 165MM 90D (ELECTRODE) IMPLANT
ELECT REM PT RETURN 9FT ADLT (ELECTROSURGICAL)
ELECTRODE REM PT RTRN 9FT ADLT (ELECTROSURGICAL) IMPLANT
GAUZE SPONGE 4X4 12PLY STRL (GAUZE/BANDAGES/DRESSINGS) ×2 IMPLANT
GLOVE BIO SURGEON STRL SZ8 (GLOVE) ×2 IMPLANT
GLOVE BIOGEL PI IND STRL 8 (GLOVE) ×2 IMPLANT
GLOVE BIOGEL PI INDICATOR 8 (GLOVE) ×2
GLOVE ORTHO TXT STRL SZ7.5 (GLOVE) ×2 IMPLANT
GOWN STRL REUS W/ TWL LRG LVL3 (GOWN DISPOSABLE) ×1 IMPLANT
GOWN STRL REUS W/ TWL XL LVL3 (GOWN DISPOSABLE) ×2 IMPLANT
GOWN STRL REUS W/TWL LRG LVL3 (GOWN DISPOSABLE) ×1
GOWN STRL REUS W/TWL XL LVL3 (GOWN DISPOSABLE) ×2
IV NS IRRIG 3000ML ARTHROMATIC (IV SOLUTION) ×4 IMPLANT
KNEE WRAP E Z 3 GEL PACK (MISCELLANEOUS) ×2 IMPLANT
MANIFOLD NEPTUNE II (INSTRUMENTS) ×2 IMPLANT
PACK ARTHROSCOPY DSU (CUSTOM PROCEDURE TRAY) ×2 IMPLANT
PACK BASIN DAY SURGERY FS (CUSTOM PROCEDURE TRAY) ×2 IMPLANT
PADDING CAST COTTON 6X4 STRL (CAST SUPPLIES) ×2 IMPLANT
PENCIL BUTTON HOLSTER BLD 10FT (ELECTRODE) IMPLANT
PORT APPOLLO RF 90DEGREE MULTI (SURGICAL WAND) IMPLANT
SLEEVE SCD COMPRESS KNEE MED (MISCELLANEOUS) IMPLANT
SUT MNCRL AB 4-0 PS2 18 (SUTURE) ×2 IMPLANT
TOWEL GREEN STERILE FF (TOWEL DISPOSABLE) ×2 IMPLANT
TUBING ARTHROSCOPY IRRIG 16FT (MISCELLANEOUS) ×2 IMPLANT
WATER STERILE IRR 1000ML POUR (IV SOLUTION) ×2 IMPLANT

## 2018-09-14 NOTE — H&P (Signed)
PREOPERATIVE H&P  Chief Complaint: left knee pain  HPI: [name] is a 68 y.o. male who presents for preoperative history and physical with a diagnosis of left knee meniscus tear. Symptoms are rated as moderate to severe, and have been worsening.  This is significantly impairing activities of daily living.  He has elected for surgical management. Failed injections.  Past Medical History:  Diagnosis Date  . Adenomatous colon polyp 2011  . Anemia   . Angina pectoris ([place]) 05/01/2015   med rx for 95% OM (u/a to access due to tortuosity), 95% D1 and other moderate CAD at cath  . Arthritis    "back, knees" (05/01/2015)  . Chest pain    pleuritic  . Chronic lower back pain   . Coronary artery disease   . Depression   . GERD (gastroesophageal reflux disease)   . HTN (hypertension)   . Hyperlipidemia   . Obstructive sleep apnea    noncompliant with CPAP  . Osteoarthritis   . Pneumonia ~ 2014 X 1  . Prostate cancer ([place])   . Refusal of blood transfusions as patient is Jehovah's Witness   . Type II or unspecified type diabetes mellitus without mention of complication, not stated as uncontrolled    Past Surgical History:  Procedure Laterality Date  . CARDIAC CATHETERIZATION  2004   patent coronary arteries  . CARDIAC CATHETERIZATION  05/01/2015   "tried to put stent in but couldn't"  . CARDIAC CATHETERIZATION N/A 05/01/2015   Procedure: Left Heart Cath and Coronary Angiography;  Surgeon: Belva Crome, MD; LAD 60%, oD1 90%, pD1 70%, D2 70%, CFX patent stent, 30% distal to prev stent, pRCA 20%, OM1 90/95%, NL LV  . CARDIAC CATHETERIZATION N/A 05/01/2015   Procedure: Coronary Stent Intervention;  Surgeon: Belva Crome, MD;  Unsuccessful PCI OM due to tortuosity  . CARDIAC CATHETERIZATION N/A 11/06/2014   Procedure: Left Heart Cath and Coronary Angiography;  Surgeon: Belva Crome, MD;  Location: [facility];  Service: Cardiovascular;  Laterality: N/A;  . CLOSED REDUCTION SHOULDER  DISLOCATION Right ~ 1975   "& reattached muscle"  . COLONOSCOPY W/ BIOPSIES AND POLYPECTOMY  X 2  . ESOPHAGOGASTRODUODENOSCOPY ENDOSCOPY    . PILONIDAL CYST EXCISION     Social History   Socioeconomic History  . Marital status: Married    Spouse name: [name]  . Number of children: 4  . Years of education: Not on file  . Highest education level: Not on file  Occupational History  . Occupation: Retired    Fish farm manager: UNEMPLOYED  Social Needs  . Financial resource strain: Not on file  . Food insecurity    Worry: Not on file    Inability: Not on file  . Transportation needs    Medical: No    Non-medical: No  Tobacco Use  . Smoking status: Former Smoker    Packs/day: 0.50    Years: 10.00    Pack years: 5.00    Types: Cigarettes    Quit date: 04/06/1983    Years since quitting: 35.4  . Smokeless tobacco: Never Used  Substance and Sexual Activity  . Alcohol use: Yes    Alcohol/week: 0.0 standard drinks    Comment: `/26/2017 "might drink a beer q couple months mostly; summertime I might drink 2-3 beers/week"  . Drug use: No  . Sexual activity: Not Currently  Lifestyle  . Physical activity    Days per week: Not on file    Minutes per  session: Not on file  . Stress: Not on file  Relationships  . Social Herbalist on phone: Not on file    Gets together: Not on file    Attends religious service: Not on file    Active member of club or organization: Not on file    Attends meetings of clubs or organizations: Not on file    Relationship status: Not on file  Other Topics Concern  . Not on file  Social History Narrative   Lives in [place] with spouse Catrell Morrone).  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   Family History  Problem Relation Age of Onset  . Coronary artery disease Other        CABG  . Coronary artery disease Mother   . Stroke Mother   . Heart disease Mother   . Diabetes Mother   . Other  Father   . Diabetes Sister   . Diabetes Maternal Uncle        x2  . Alcohol abuse Other   . Diabetes Paternal Uncle        x2  . Colon cancer Paternal Uncle   . Colon polyps Neg Hx   . Esophageal cancer Neg Hx    Allergies  Allergen Reactions  . Statins Other (See Comments)    REACTION: joint pain Lipitor- headaches Has also tried Livalo, pravachol, zetia, crestor, welchol  . Imdur [Isosorbide Dinitrate]     Headache - "violent"   Prior to Admission medications   Medication Sig Start Date End Date Taking? Authorizing Provider  albuterol (VENTOLIN HFA) 108 (90 Base) MCG/ACT inhaler Inhale 1-2 puffs into the lungs every 6 (six) hours as needed for wheezing or shortness of breath. 07/25/18  Yes Hagler, Aaron Edelman, MD  glipiZIDE (GLUCOTROL XL) 5 MG 24 hr tablet TAKE 1 TABLET BY MOUTH TWICE A DAY 07/17/18  Yes [name], MD  hydrALAZINE (APRESOLINE) 25 MG tablet TAKE 1 TABLET BY MOUTH TWICE A DAY 05/23/17  Yes Belva Crome, MD  hydrochlorothiazide (HYDRODIURIL) 25 MG tablet Take 1 tablet (25 mg total) by mouth daily. 05/05/18  Yes Belva Crome, MD  Insulin Pen Needle (NOVOFINE PLUS) 32G X 4 MM MISC Use 1x a day 05/09/14  Yes [name], MD  LEVEMIR FLEXTOUCH 100 UNIT/ML Pen INJECT 16 UNITS INTO THE SKIN DAILY AT 10 PM. 07/27/18  Yes [name], MD  losartan (COZAAR) 100 MG tablet Take 1 tablet (100 mg total) by mouth daily. 05/05/18  Yes Belva Crome, MD  metFORMIN (GLUCOPHAGE) 500 MG tablet TAKE 2 TABLETS (1,000 MG TOTAL) BY MOUTH 2 (TWO) TIMES DAILY WITH A MEAL. 07/18/18  Yes [name], MD  metoprolol tartrate (LOPRESSOR) 50 MG tablet Take 1 tablet (50 mg total) by mouth 2 (two) times daily. 04/18/18  Yes Belva Crome, MD  Blood Glucose Monitoring Suppl (RELION PREMIER BLU MONITOR) DEVI 1 Device by Does not apply route daily. 07/12/16   [name], MD  Evolocumab (REPATHA SURECLICK) 465 MG/ML SOAJ Inject 1 pen into the skin every 14 (fourteen) days. 06/29/18    Belva Crome, MD  glucose blood test strip Use as instructed to check sugar 2 times daily 07/12/16   [name], MD  nitroGLYCERIN (NITROSTAT) 0.4 MG SL tablet PLACE ONE TABLET UNDER THE TONGUE EVERY 5 MINUTES X3 DOSES AS NEEDED FOR CHEST PAIN 01/07/17   Belva Crome, MD  pravastatin (  PRAVACHOL) 40 MG tablet Take 40 mg by mouth daily. 12/13/15   [provider]     Positive ROS: All other systems have been reviewed and were otherwise negative with the exception of those mentioned in the HPI and as above.  Physical Exam: General: Alert, no acute distress Cardiovascular: No pedal edema Respiratory: No cyanosis, no use of accessory musculature GI: No organomegaly, abdomen is soft and non-tender Skin: No lesions in the area of chief complaint Neurologic: Sensation intact distally Psychiatric: Patient is competent for consent with normal mood and affect Lymphatic: No axillary or cervical lymphadenopathy  MUSCULOSKELETAL: left knee medial joint line pain.    Assessment: Left knee medial meniscus tear.     Plan: Plan for Procedure(s): LEFT KNEE ARTHROSCOPY CHONDROPLASY,  WITH MEDIAL MENISECTOMY  The risks benefits and alternatives were discussed with the patient including but not limited to the risks of nonoperative treatment, versus surgical intervention including infection, bleeding, nerve injury,  blood clots, progression of arthritis, incomplete relief of pain, cardiopulmonary complications, morbidity, mortality, among others, and they were willing to proceed.     Johnny Bridge, MD Cell [phone]   09/14/2018 12:23 PM

## 2018-09-14 NOTE — Op Note (Signed)
09/14/2018  1:38 PM  PATIENT:  [name]    PRE-OPERATIVE DIAGNOSIS:   Left knee medial meniscus tear  POST-OPERATIVE DIAGNOSIS:    Left knee medial meniscus tear, with very small central lateral meniscus tear  PROCEDURE:  LEFT KNEE ARTHROSCOPY CHONDROPLASY,  WITH MEDIAL MENISECTOMY  SURGEON:  Johnny Bridge, MD  PHYSICIAN ASSISTANT: Joya Gaskins, OPA-C, present and scrubbed throughout the case, critical for completion in a timely fashion, and for retraction, instrumentation, and closure.  ANESTHESIA:   General  PREOPERATIVE INDICATIONS:  [name] is a  68 y.o. male with a diagnosis of left medial meniscus tear who failed conservative measures and elected for surgical management.    The risks benefits and alternatives were discussed with the patient preoperatively including but not limited to the risks of infection, bleeding, nerve injury, cardiopulmonary complications, the need for revision surgery, among others, and the patient was willing to proceed.  ESTIMATED BLOOD LOSS: Minimal  OPERATIVE IMPLANTS: None  OPERATIVE FINDINGS: Patellofemoral joint was normal.  Lateral compartment was normal with the exception of a very small amount of central meniscal fraying.  The medial meniscus had a complex tear, that was intrasubstance from the mid body extending around to the posterior horn.  There was some grade 1 changes and maybe some grade 2 changes diffusely on the femur and the tibia medially.  No exposed bone.  OPERATIVE PROCEDURE: The patient was brought to the operating room and placed in supine position.  General anesthesia was administered.  The left lower extremity was prepped and draped in usual sterile fashion.  Timeout performed.  The leg was fairly stiff, difficult to get in and out of the compartments.  Diagnostic arthroscopy was carried out the above-named findings noted.  I used the arthroscopic shaver and the arthroscopic basket to debride the medial meniscus  back to stable configuration and performed a light chondroplasty to some frayed portions of the cartilage on the medial side.  The lateral compartment I used the shaver to debride the lateral meniscus centrally.  The instruments were removed, the portals closed with Monocryl followed by Steri-Strips and sterile gauze.  He was awakened and returned to the PACU in stable and satisfactory condition.  There were no complications and he tolerated the procedure well.

## 2018-09-14 NOTE — Transfer of Care (Signed)
Immediate Anesthesia Transfer of Care Note  Patient: [name]  Procedure(s) Performed: LEFT KNEE ARTHROSCOPY CHONDROPLASY,  WITH MEDIAL MENISECTOMY (Left )  Patient Location: PACU  Anesthesia Type:General  Level of Consciousness: drowsy and patient cooperative  Airway & Oxygen Therapy: Patient Spontanous Breathing and Patient connected to nasal cannula oxygen  Post-op Assessment: Report given to RN and Post -op Vital signs reviewed and stable  Post vital signs: Reviewed and stable  Last Vitals:  Vitals Value Taken Time  BP    Temp    Pulse 53 09/14/18 1350  Resp 11 09/14/18 1350  SpO2 100 % 09/14/18 1350  Vitals shown include unvalidated device data.  Last Pain:  Vitals:   09/14/18 1011  TempSrc: Oral  PainSc: 0-No pain         Complications: No apparent anesthesia complications

## 2018-09-14 NOTE — Anesthesia Procedure Notes (Signed)
Procedure Name: LMA Insertion Date/Time: 09/14/2018 12:51 PM Performed by: Willa Frater, CRNA Pre-anesthesia Checklist: Patient identified, Emergency Drugs available, Suction available and Patient being monitored Patient Re-evaluated:Patient Re-evaluated prior to induction Oxygen Delivery Method: Circle system utilized Preoxygenation: Pre-oxygenation with 100% oxygen Induction Type: IV induction Ventilation: Mask ventilation without difficulty LMA: LMA inserted LMA Size: 5.0 Number of attempts: 1 Airway Equipment and Method: Bite block Placement Confirmation: positive ETCO2 Tube secured with: Tape Dental Injury: Teeth and Oropharynx as per pre-operative assessment

## 2018-09-14 NOTE — Discharge Instructions (Signed)
Diet: As you were doing prior to hospitalization   Shower:  May shower but keep the wounds dry, use an occlusive plastic wrap, NO SOAKING IN TUB.  If the bandage gets wet, change with a clean dry gauze.  If you have a splint on, leave the splint in place and keep the splint dry with a plastic bag.  Dressing:  You may change your dressing 3-5 days after surgery, unless you have a splint.  If you have a splint, then just leave the splint in place and we will change your bandages during your first follow-up appointment.    If you had hand or foot surgery, we will plan to remove your stitches in about 2 weeks in the office.  For all other surgeries, there are sticky tapes (steri-strips) on your wounds and all the stitches are absorbable.  Leave the steri-strips in place when changing your dressings, they will peel off with time, usually 2-3 weeks.  Activity:  Increase activity slowly as tolerated, but follow the weight bearing instructions below.  The rules on driving is that you can not be taking narcotics while you drive, and you must feel in control of the vehicle.    Weight Bearing:   As tolerated.    To prevent constipation: you may use a stool softener such as -  Colace (over the counter) 100 mg by mouth twice a day  Drink plenty of fluids (prune juice may be helpful) and high fiber foods Miralax (over the counter) for constipation as needed.    Itching:  If you experience itching with your medications, try taking only a single pain pill, or even half a pain pill at a time.  You may take up to 10 pain pills per day, and you can also use benadryl over the counter for itching or also to help with sleep.   Precautions:  If you experience chest pain or shortness of breath - call 911 immediately for transfer to the hospital emergency department!!  If you develop a fever greater that 101 F, purulent drainage from wound, increased redness or drainage from wound, or calf pain -- Call the office at  [phone]                                                Follow- Up Appointment:  Please call for an appointment to be seen in 2 weeks [place] - [phone]    Post Anesthesia Home Care Instructions  Activity: Get plenty of rest for the remainder of the day. A responsible individual must stay with you for 24 hours following the procedure.  For the next 24 hours, DO NOT: -Drive a car -Paediatric nurse -Drink alcoholic beverages -Take any medication unless instructed by your physician -Make any legal decisions or sign important papers.  Meals: Start with liquid foods such as gelatin or soup. Progress to regular foods as tolerated. Avoid greasy, spicy, heavy foods. If nausea and/or vomiting occur, drink only clear liquids until the nausea and/or vomiting subsides. Call your physician if vomiting continues.  Special Instructions/Symptoms: Your throat may feel dry or sore from the anesthesia or the breathing tube placed in your throat during surgery. If this causes discomfort, gargle with warm salt water. The discomfort should disappear within 24 hours.  If you had a scopolamine patch placed behind your ear for the management of post-  operative nausea and/or vomiting:  1. The medication in the patch is effective for 72 hours, after which it should be removed.  Wrap patch in a tissue and discard in the trash. Wash hands thoroughly with soap and water. 2. You may remove the patch earlier than 72 hours if you experience unpleasant side effects which may include dry mouth, dizziness or visual disturbances. 3. Avoid touching the patch. Wash your hands with soap and water after contact with the patch.

## 2018-09-14 NOTE — Anesthesia Preprocedure Evaluation (Addendum)
Anesthesia Evaluation  Patient identified by MRN, date of birth, ID band Patient awake    Reviewed: Allergy & Precautions, NPO status , Patient's Chart, lab work & pertinent test results, reviewed documented beta blocker date and time   History of Anesthesia Complications Negative for: history of anesthetic complications  Airway Mallampati: II  TM Distance: >3 FB Neck ROM: Full    Dental   Pulmonary sleep apnea , former smoker,    Pulmonary exam normal        Cardiovascular hypertension, Pt. on medications and Pt. on home beta blockers + CAD and + Cardiac Stents  Normal cardiovascular exam+ dysrhythmias Atrial Fibrillation      Neuro/Psych negative neurological ROS  negative psych ROS   GI/Hepatic Neg liver ROS, GERD  ,  Endo/Other  diabetes, Type 2, Oral Hypoglycemic Agents, Insulin Dependent  Renal/GU negative Renal ROS  negative genitourinary   Musculoskeletal negative musculoskeletal ROS (+)   Abdominal   Peds  Hematology negative hematology ROS (+)   Anesthesia Other Findings   Reproductive/Obstetrics                           Anesthesia Physical Anesthesia Plan  ASA: III  Anesthesia Plan: General   Post-op Pain Management:    Induction: Intravenous  PONV Risk Score and Plan: 2 and Ondansetron, Dexamethasone, Midazolam and Treatment may vary due to age or medical condition  Airway Management Planned: LMA  Additional Equipment: None  Intra-op Plan:   Post-operative Plan: Extubation in OR  Informed Consent: I have reviewed the patients History and Physical, chart, labs and discussed the procedure including the risks, benefits and alternatives for the proposed anesthesia with the patient or authorized representative who has indicated his/her understanding and acceptance.     Dental advisory given  Plan Discussed with:   Anesthesia Plan Comments:        Anesthesia  Quick Evaluation

## 2018-09-15 ENCOUNTER — Encounter (HOSPITAL_BASED_OUTPATIENT_CLINIC_OR_DEPARTMENT_OTHER): Payer: Self-pay | Admitting: Orthopedic Surgery

## 2018-09-15 NOTE — Anesthesia Postprocedure Evaluation (Signed)
Anesthesia Post Note  Patient: [name]  Procedure(s) Performed: LEFT KNEE ARTHROSCOPY CHONDROPLASY,  WITH MEDIAL MENISECTOMY (Left )     Patient location during evaluation: PACU Anesthesia Type: General Level of consciousness: awake and alert Pain management: pain level controlled Vital Signs Assessment: post-procedure vital signs reviewed and stable Respiratory status: spontaneous breathing, nonlabored ventilation and respiratory function stable Cardiovascular status: blood pressure returned to baseline and stable Postop Assessment: no apparent nausea or vomiting Anesthetic complications: no    Last Vitals:  Vitals:   09/14/18 1430 09/14/18 1445  BP: (!) 172/86 (!) 176/99  Pulse: 63 (!) 53  Resp: 14 16  Temp:  36.6 C  SpO2: 100% 100%    Last Pain:  Vitals:   09/15/18 0913  TempSrc:   PainSc: 4                  Lidia Collum

## 2018-09-22 ENCOUNTER — Telehealth: Payer: Self-pay | Admitting: *Deleted

## 2018-09-22 NOTE — Telephone Encounter (Signed)
    COVID-19 Pre-Screening Questions:  . In the past 7 to 10 days have you had a cough,  shortness of breath, headache, congestion, fever (100 or greater) body aches, chills, sore throat, or sudden loss of taste or sense of smell? . Have you been around anyone with known Covid 19. . Have you been around anyone who is awaiting Covid 19 test results in the past 7 to 10 days? . Have you been around anyone who has been exposed to Covid 19, or has mentioned symptoms of Covid 19 within the past 7 to 10 days?  If you have any concerns/questions about symptoms patients report during screening (either on the phone or at threshold). Contact the provider seeing the patient or DOD for further guidance.  If neither are available contact a member of the leadership team.           Contacted patient via telephone call. All no to Covid 19 questions . Has a mask.KB 

## 2018-09-25 ENCOUNTER — Other Ambulatory Visit: Payer: Federal, State, Local not specified - PPO | Admitting: *Deleted

## 2018-09-25 ENCOUNTER — Other Ambulatory Visit: Payer: Self-pay

## 2018-09-25 DIAGNOSIS — E785 Hyperlipidemia, unspecified: Secondary | ICD-10-CM

## 2018-09-25 DIAGNOSIS — M25511 Pain in right shoulder: Secondary | ICD-10-CM | POA: Diagnosis not present

## 2018-09-25 LAB — HEPATIC FUNCTION PANEL
ALT: 18 IU/L (ref 0–44)
AST: 17 IU/L (ref 0–40)
Albumin: 4 g/dL (ref 3.8–4.8)
Alkaline Phosphatase: 114 IU/L (ref 39–117)
Bilirubin Total: 0.2 mg/dL (ref 0.0–1.2)
Bilirubin, Direct: 0.08 mg/dL (ref 0.00–0.40)
Total Protein: 7 g/dL (ref 6.0–8.5)

## 2018-09-25 LAB — LIPID PANEL
Chol/HDL Ratio: 2.5 ratio (ref 0.0–5.0)
Cholesterol, Total: 111 mg/dL (ref 100–199)
HDL: 44 mg/dL (ref >39)
LDL Calculated: 44 mg/dL (ref 0–99)
Triglycerides: 117 mg/dL (ref 0–149)
VLDL Cholesterol Cal: 23 mg/dL (ref 5–40)

## 2018-09-27 ENCOUNTER — Ambulatory Visit: Payer: Federal, State, Local not specified - PPO | Admitting: Family Medicine

## 2018-09-27 ENCOUNTER — Other Ambulatory Visit: Payer: Self-pay | Admitting: Interventional Cardiology

## 2018-09-28 ENCOUNTER — Encounter: Payer: Self-pay | Admitting: Internal Medicine

## 2018-09-28 ENCOUNTER — Telehealth: Payer: Self-pay | Admitting: *Deleted

## 2018-09-28 ENCOUNTER — Ambulatory Visit: Payer: Federal, State, Local not specified - PPO | Admitting: Internal Medicine

## 2018-09-28 ENCOUNTER — Ambulatory Visit (INDEPENDENT_AMBULATORY_CARE_PROVIDER_SITE_OTHER): Payer: Federal, State, Local not specified - PPO | Admitting: Internal Medicine

## 2018-09-28 DIAGNOSIS — E785 Hyperlipidemia, unspecified: Secondary | ICD-10-CM | POA: Diagnosis not present

## 2018-09-28 DIAGNOSIS — E669 Obesity, unspecified: Secondary | ICD-10-CM | POA: Diagnosis not present

## 2018-09-28 DIAGNOSIS — E1159 Type 2 diabetes mellitus with other circulatory complications: Secondary | ICD-10-CM | POA: Diagnosis not present

## 2018-09-28 NOTE — Patient Instructions (Addendum)
Please continue: - Metformin 1000 mg 2x a day - Glipizide XL 5 mg 2x a day - break/crush the evening dose - Levemir 20 units at bedtime  Please start Ozempic 0.25 mg weekly in a.m. (for example on Sunday morning) x 4 weeks, then increase to 0.5 mg weekly in a.m. if no nausea or hypoglycemia.  Please come back for a follow-up appointment in 3 months.

## 2018-09-28 NOTE — Progress Notes (Signed)
Patient ID: [name], male   DOB: [birth year], 68 y.o.   MRN: [id number]  Appointment date - in person: 10/05/18.   He had a no show for his appt in 09/28/2018.  HPI: [name] is a 68 y.o.-year-old male, returning for f/u for DM2, dx 2007, prev. insulin-dependent since 2014, uncontrolled, with complications (CAD - s/p NSTEMI 11/2014 - stent; ED). Last visit 8 months ago. PCP: Dr Odette Fraction.  Since last visit, he had radiotherapy for his prostate cancer (with gold beads).  Also, he had left knee arthroscopy last month.  Since last visit, he relaxed his diet.  He goes to bed late and eats at night.  He gained weight and his sugars are worse.  Last hemoglobin A1c was: Lab Results  Component Value Date   HGBA1C 7.0 (A) 02/06/2018   HGBA1C 6.6 (A) 10/04/2017   HGBA1C 8.9 07/05/2017   Pt is on a regimen of: - Metformin 1000 mg 2x a day - Glipizide XL 5 mg 2x a day - break/crush the evening dose  - Levemir 16 >> 20 units at bedtime - added 07/2017 He tried Toujeo 20 units at night but did not like it He did not start Januvia as suggested at last visit ("I don't think I need it"). He was Levemir 140 units in am >> decreased to 70 units>> stopped as this was too expensive for him He stopped Welchol 3.7g at bedtime a day. Stopped Farxiga 10 mg b/c frequent urination and urgency. He was on Glipizide in the past. He was on Metformin in the past >> no N/V.   Pt checks his sugars once a day: - am: 180-200 >> 106-130 >> 105, 147-157 >> 150-180 (eating during the night) - 2h after b'fast: n/c >> 128, 134 >> n/c - before lunch:   206 >> n/c >> 140s >> n/c - 2h after lunch: n/c >> 140-157 >> n/c - before dinner:  106-120 >> n/c >> 80-130 >> n/c - 2h after dinner: 200-245 >> 150-180, 200 >> n/c - bedtime:  151 >> 127-180, 200 >> n/c >> 180-200 >> 180-200 - nighttime:94, 190-305 >> 148-216 >> n/c  Lowest sugar was 80 >> 105 >> 140; he has hypoglycemia awareness in the 80s. Highest sugar  was 220 >> 250.  -+ Mild CKD, last BUN/creatinine:  Lab Results  Component Value Date   BUN 20 09/11/2018   CREATININE 1.17 09/11/2018  09/07/2016: 13/0.9, glucose 133 On losartan. -+ HL; last set of lipids: Lab Results  Component Value Date   CHOL 111 09/25/2018   HDL 44 09/25/2018   LDLCALC 44 09/25/2018   LDLDIRECT 117 (H) 01/17/2018   TRIG 117 09/25/2018   CHOLHDL 2.5 09/25/2018  09/07/2016: 167/65/33/121 On pravastatin >> stopped and started Repatha. - last eye exam was in 2020: No DR. Dr. Lady Gary. - no numbness and tingling in his feet.  Pt was admitted for CP in 11/2014: NSTEMI >> CAD >> PTCA >> stent placed.  He was in ED with CP 05/2017 >> no cardiac pathology.  ROS: Constitutional: + weight gain/no weight loss, no fatigue, no subjective hyperthermia, no subjective hypothermia Eyes: no blurry vision, no xerophthalmia ENT: no sore throat, no nodules palpated in neck, no dysphagia, no odynophagia, no hoarseness Cardiovascular: no CP/no SOB/no palpitations/no leg swelling Respiratory: no cough/no SOB/no wheezing Gastrointestinal: no N/no V/no D/no C/no acid reflux Musculoskeletal: no muscle aches/no joint aches Skin: no rashes, no hair loss Neurological: no tremors/no numbness/no tingling/no dizziness  I  reviewed pt's medications, allergies, PMH, social hx, family hx, and changes were documented in the history of present illness. Otherwise, unchanged from my initial visit note.  Past Medical History:  Diagnosis Date  . Adenomatous colon polyp 2011  . Anemia   . Angina pectoris ([place]) 05/01/2015   med rx for 95% OM (u/a to access due to tortuosity), 95% D1 and other moderate CAD at cath  . Arthritis    "back, knees" (05/01/2015)  . Chest pain    pleuritic  . Chronic lower back pain   . Complex tear of medial meniscus of left knee 09/14/2018  . Coronary artery disease   . Depression   . GERD (gastroesophageal reflux disease)   . HTN (hypertension)   .  Hyperlipidemia   . Obstructive sleep apnea    noncompliant with CPAP  . Osteoarthritis   . Pneumonia ~ 2014 X 1  . Prostate cancer ([place])   . Refusal of blood transfusions as patient is Jehovah's Witness   . Type II or unspecified type diabetes mellitus without mention of complication, not stated as uncontrolled    Past Surgical History:  Procedure Laterality Date  . CARDIAC CATHETERIZATION  2004   patent coronary arteries  . CARDIAC CATHETERIZATION  05/01/2015   "tried to put stent in but couldn't"  . CARDIAC CATHETERIZATION N/A 05/01/2015   Procedure: Left Heart Cath and Coronary Angiography;  Surgeon: Belva Crome, MD; LAD 60%, oD1 90%, pD1 70%, D2 70%, CFX patent stent, 30% distal to prev stent, pRCA 20%, OM1 90/95%, NL LV  . CARDIAC CATHETERIZATION N/A 05/01/2015   Procedure: Coronary Stent Intervention;  Surgeon: Belva Crome, MD;  Unsuccessful PCI OM due to tortuosity  . CARDIAC CATHETERIZATION N/A 11/06/2014   Procedure: Left Heart Cath and Coronary Angiography;  Surgeon: Belva Crome, MD;  Location: [facility];  Service: Cardiovascular;  Laterality: N/A;  . CLOSED REDUCTION SHOULDER DISLOCATION Right ~ 1975   "& reattached muscle"  . COLONOSCOPY W/ BIOPSIES AND POLYPECTOMY  X 2  . ESOPHAGOGASTRODUODENOSCOPY ENDOSCOPY    . KNEE ARTHROSCOPY WITH MEDIAL MENISECTOMY Left 09/14/2018   Procedure: LEFT KNEE ARTHROSCOPY CHONDROPLASY,  WITH MEDIAL MENISECTOMY;  Surgeon: Marchia Bond, MD;  Location: [facility];  Service: Orthopedics;  Laterality: Left;  . PILONIDAL CYST EXCISION     Social History   Socioeconomic History  . Marital status: Married    Spouse name: [name]  . Number of children: 4  . Years of education: Not on file  . Highest education level: Not on file  Occupational History  . Occupation: Retired    Fish farm manager: UNEMPLOYED  Social Needs  . Financial resource strain: Not on file  . Food insecurity    Worry: Not on file    Inability: Not on  file  . Transportation needs    Medical: No    Non-medical: No  Tobacco Use  . Smoking status: Former Smoker    Packs/day: 0.50    Years: 10.00    Pack years: 5.00    Types: Cigarettes    Quit date: 04/06/1983    Years since quitting: 35.5  . Smokeless tobacco: Never Used  Substance and Sexual Activity  . Alcohol use: Yes    Alcohol/week: 0.0 standard drinks    Comment: `/26/2017 "might drink a beer q couple months mostly; summertime I might drink 2-3 beers/week"  . Drug use: No  . Sexual activity: Not Currently  Lifestyle  . Physical activity  Days per week: Not on file    Minutes per session: Not on file  . Stress: Not on file  Relationships  . Social Herbalist on phone: Not on file    Gets together: Not on file    Attends religious service: Not on file    Active member of club or organization: Not on file    Attends meetings of clubs or organizations: Not on file    Relationship status: Not on file  . Intimate partner violence    Fear of current or ex partner: No    Emotionally abused: No    Physically abused: No    Forced sexual activity: No  Other Topics Concern  . Not on file  Social History Narrative   Lives in [place] with spouse Lavontae Cornia).  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   Current Outpatient Medications on File Prior to Visit  Medication Sig Dispense Refill  . albuterol (VENTOLIN HFA) 108 (90 Base) MCG/ACT inhaler Inhale 1-2 puffs into the lungs every 6 (six) hours as needed for wheezing or shortness of breath. 1 Inhaler 0  . Blood Glucose Monitoring Suppl (RELION PREMIER BLU MONITOR) DEVI 1 Device by Does not apply route daily. 1 Device 0  . Evolocumab (REPATHA SURECLICK) 299 MG/ML SOAJ Inject 1 pen into the skin every 14 (fourteen) days. 2 pen 11  . glipiZIDE (GLUCOTROL XL) 5 MG 24 hr tablet TAKE 1 TABLET BY MOUTH TWICE A DAY 180 tablet 1  . glucose blood test strip Use as  instructed to check sugar 2 times daily 200 each 5  . hydrALAZINE (APRESOLINE) 25 MG tablet TAKE 1 TABLET BY MOUTH TWICE A DAY 60 tablet 5  . hydrochlorothiazide (HYDRODIURIL) 25 MG tablet Take 1 tablet (25 mg total) by mouth daily. 90 tablet 3  . HYDROcodone-acetaminophen (NORCO) 5-325 MG tablet Take 1-2 tablets by mouth every 6 (six) hours as needed for moderate pain. MAXIMUM TOTAL ACETAMINOPHEN DOSE IS 4000 MG PER DAY 30 tablet 0  . Insulin Pen Needle (NOVOFINE PLUS) 32G X 4 MM MISC Use 1x a day 100 each 11  . LEVEMIR FLEXTOUCH 100 UNIT/ML Pen INJECT 16 UNITS INTO THE SKIN DAILY AT 10 PM. 15 mL 3  . losartan (COZAAR) 100 MG tablet Take 1 tablet (100 mg total) by mouth daily. 90 tablet 3  . metFORMIN (GLUCOPHAGE) 500 MG tablet TAKE 2 TABLETS (1,000 MG TOTAL) BY MOUTH 2 (TWO) TIMES DAILY WITH A MEAL. 360 tablet 0  . metoprolol tartrate (LOPRESSOR) 50 MG tablet Take 1 tablet (50 mg total) by mouth 2 (two) times daily. 180 tablet 3  . nitroGLYCERIN (NITROSTAT) 0.4 MG SL tablet PLACE ONE TABLET UNDER THE TONGUE EVERY 5 MINUTES X3 DOSES AS NEEDED FOR CHEST PAIN 75 tablet 1  . pravastatin (PRAVACHOL) 40 MG tablet Take 40 mg by mouth daily.  3  . sennosides-docusate sodium (SENOKOT-S) 8.6-50 MG tablet Take 2 tablets by mouth daily. 30 tablet 1   No current facility-administered medications on file prior to visit.    Allergies  Allergen Reactions  . Statins Other (See Comments)    REACTION: joint pain Lipitor- headaches Has also tried Livalo, pravachol, zetia, crestor, welchol  . Imdur [Isosorbide Dinitrate]     Headache - "violent"   Family History  Problem Relation Age of Onset  . Coronary artery disease Other        CABG  .  Coronary artery disease Mother   . Stroke Mother   . Heart disease Mother   . Diabetes Mother   . Other Father   . Diabetes Sister   . Diabetes Maternal Uncle        x2  . Alcohol abuse Other   . Diabetes Paternal Uncle        x2  . Colon cancer Paternal Uncle    . Colon polyps Neg Hx   . Esophageal cancer Neg Hx     PE: Weight 254 lbs, BP 150/90, P 55, O2 Sat: 98 There were no vitals taken for this visit.  Wt Readings from Last 3 Encounters:  09/14/18 251 lb 1.7 oz (113.9 kg)  06/15/18 250 lb 12.8 oz (113.8 kg)  06/08/18 250 lb (113.4 kg)   Constitutional: overweight, in NAD Eyes: PERRLA, EOMI, no exophthalmos ENT: moist mucous membranes, no thyromegaly, no cervical lymphadenopathy Cardiovascular: RRR, No MRG Respiratory: CTA B Gastrointestinal: abdomen soft, NT, ND, BS+ Musculoskeletal: no deformities, strength intact in all 4 Skin: moist, warm, no rashes Neurological: no tremor with outstretched hands, DTR normal in all 4  ASSESSMENT: 1. DM2, insulin-dependent, uncontrolled, with complications - CAD, s/p NSTEMI, s/p stent 11/2014 - ED  2. Obesity class II BMI Classification:  < 18.5 underweight   18.5-24.9 normal weight   25.0-29.9 overweight   30.0-34.9 class I obesity   35.0-39.9 class II obesity   ? 40.0 class III obesity   3. HL  PLAN:  1. Patient with improved diabetes control in the last 3 years, after which initially he could come off long-acting insulin, but this had to be added back a year ago.  At last visit, we increased the dose.  At that time, sugars were higher at all times of the day.  Upon questioning, he did relax his diet and was also eating during the night.  We discussed about the importance of not eating after dinner.  At that time, HbA1c was 7.0%, higher. -Due to eating at night.  We discussed about adding a GLP-1 receptor agonist especially since he has a history of CAD and we discussed that this can help with cardiovascular outcomes.  He agrees to try this if covered by the insurance.  We discussed about possible side effects.  We will start at a low dose and advance as tolerated.  We will continue the rest of the medicines we discussed about possibly stopping glipizide if sugars improved.  In the  meantime, I advised him to work on his diet, especially by cutting out snacks at night. - I suggested to:  Patient Instructions  Please continue: - Metformin 1000 mg 2x a day - Glipizide XL 5 mg 2x a day - break/crush the evening dose - Levemir 20 units at bedtime  Please start Ozempic 0.25 mg weekly in a.m. (for example on Sunday morning) x 4 weeks, then increase to 0.5 mg weekly in a.m. if no nausea or hypoglycemia.  Please come back for a follow-up appointment in 3 months.  - today, HbA1c is 7.7% (higher) - continue checking sugars at different times of the day - check 1-2x a day, rotating checks - advised for yearly eye exams >> he is UTD - Return to clinic in 4 mo with sugar log    2. Obesity -Weight increased since last visit -Advised him to stop eating at night  3. HL - Reviewed latest lipid panel from 3 days ago: Excellent cholesterol fractions: Lab Results  Component Value Date  CHOL 111 09/25/2018   HDL 44 09/25/2018   LDLCALC 44 09/25/2018   LDLDIRECT 117 (H) 01/17/2018   TRIG 117 09/25/2018   CHOLHDL 2.5 09/25/2018  - Continues Repatha without side effects.  [name], MD PhD [facility]

## 2018-09-28 NOTE — Telephone Encounter (Signed)
Spoke with patient. He is not currently taking pravastatin. He has a history of intolerance to multiple statins including multiple doses of pravastatin. Pravastatin is on his med list, but not supposed to be on. I will remove. Continue Repatha.

## 2018-09-28 NOTE — Telephone Encounter (Signed)
Pt has been notified of lab results by phone with verbal understanding. Pt asked is he supposed to take Pravastatin along with the Repatha? I stated I will have one of our Pharm-D's call him and go over that information with him. Pt thanked me for the call.

## 2018-10-05 ENCOUNTER — Ambulatory Visit (INDEPENDENT_AMBULATORY_CARE_PROVIDER_SITE_OTHER): Payer: Federal, State, Local not specified - PPO | Admitting: Internal Medicine

## 2018-10-05 ENCOUNTER — Other Ambulatory Visit: Payer: Self-pay

## 2018-10-05 ENCOUNTER — Encounter: Payer: Self-pay | Admitting: Internal Medicine

## 2018-10-05 VITALS — BP 150/90 | HR 55 | Ht 70.0 in | Wt 254.0 lb

## 2018-10-05 DIAGNOSIS — E1159 Type 2 diabetes mellitus with other circulatory complications: Secondary | ICD-10-CM | POA: Diagnosis not present

## 2018-10-05 LAB — POCT GLYCOSYLATED HEMOGLOBIN (HGB A1C): Hemoglobin A1C: 7.7 % — AB (ref 4.0–5.6)

## 2018-10-05 MED ORDER — OZEMPIC (0.25 OR 0.5 MG/DOSE) 2 MG/1.5ML ~~LOC~~ SOPN: 0.5000 mg | PEN_INJECTOR | SUBCUTANEOUS | 5 refills | Status: DC

## 2018-10-17 ENCOUNTER — Other Ambulatory Visit: Payer: Self-pay | Admitting: Internal Medicine

## 2018-10-19 DIAGNOSIS — Z20828 Contact with and (suspected) exposure to other viral communicable diseases: Secondary | ICD-10-CM | POA: Diagnosis not present

## 2018-10-25 DIAGNOSIS — M25511 Pain in right shoulder: Secondary | ICD-10-CM | POA: Diagnosis not present

## 2018-11-01 DIAGNOSIS — M25511 Pain in right shoulder: Secondary | ICD-10-CM | POA: Diagnosis not present

## 2018-11-22 DIAGNOSIS — M791 Myalgia, unspecified site: Secondary | ICD-10-CM | POA: Diagnosis not present

## 2018-11-22 DIAGNOSIS — M25611 Stiffness of right shoulder, not elsewhere classified: Secondary | ICD-10-CM | POA: Diagnosis not present

## 2018-11-22 DIAGNOSIS — M25511 Pain in right shoulder: Secondary | ICD-10-CM | POA: Diagnosis not present

## 2018-11-27 DIAGNOSIS — M25511 Pain in right shoulder: Secondary | ICD-10-CM | POA: Diagnosis not present

## 2018-11-27 DIAGNOSIS — M25611 Stiffness of right shoulder, not elsewhere classified: Secondary | ICD-10-CM | POA: Diagnosis not present

## 2018-11-27 DIAGNOSIS — M791 Myalgia, unspecified site: Secondary | ICD-10-CM | POA: Diagnosis not present

## 2018-12-04 DIAGNOSIS — M25611 Stiffness of right shoulder, not elsewhere classified: Secondary | ICD-10-CM | POA: Diagnosis not present

## 2018-12-04 DIAGNOSIS — M791 Myalgia, unspecified site: Secondary | ICD-10-CM | POA: Diagnosis not present

## 2018-12-04 DIAGNOSIS — M25511 Pain in right shoulder: Secondary | ICD-10-CM | POA: Diagnosis not present

## 2018-12-15 DIAGNOSIS — M25511 Pain in right shoulder: Secondary | ICD-10-CM | POA: Diagnosis not present

## 2018-12-28 ENCOUNTER — Ambulatory Visit: Payer: Self-pay | Admitting: Orthopedic Surgery

## 2019-01-12 ENCOUNTER — Other Ambulatory Visit: Payer: Self-pay

## 2019-01-12 ENCOUNTER — Encounter (HOSPITAL_BASED_OUTPATIENT_CLINIC_OR_DEPARTMENT_OTHER): Payer: Self-pay

## 2019-01-15 ENCOUNTER — Other Ambulatory Visit: Payer: Self-pay

## 2019-01-15 ENCOUNTER — Ambulatory Visit: Payer: Federal, State, Local not specified - PPO | Admitting: Internal Medicine

## 2019-01-15 ENCOUNTER — Encounter (HOSPITAL_BASED_OUTPATIENT_CLINIC_OR_DEPARTMENT_OTHER)
Admission: RE | Admit: 2019-01-15 | Discharge: 2019-01-15 | Disposition: A | Discharge status: Home / Self Care | Payer: Federal, State, Local not specified - PPO | Source: Ambulatory Visit | Attending: Orthopedic Surgery | Admitting: Orthopedic Surgery

## 2019-01-15 ENCOUNTER — Other Ambulatory Visit (HOSPITAL_COMMUNITY)
Admission: RE | Admit: 2019-01-15 | Discharge: 2019-01-15 | Disposition: A | Discharge status: Home / Self Care | Payer: Federal, State, Local not specified - PPO | Source: Ambulatory Visit | Attending: Orthopedic Surgery | Admitting: Orthopedic Surgery

## 2019-01-15 DIAGNOSIS — E669 Obesity, unspecified: Secondary | ICD-10-CM | POA: Diagnosis not present

## 2019-01-15 DIAGNOSIS — I10 Essential (primary) hypertension: Secondary | ICD-10-CM | POA: Diagnosis not present

## 2019-01-15 DIAGNOSIS — Z87891 Personal history of nicotine dependence: Secondary | ICD-10-CM | POA: Diagnosis not present

## 2019-01-15 DIAGNOSIS — Z8546 Personal history of malignant neoplasm of prostate: Secondary | ICD-10-CM | POA: Diagnosis not present

## 2019-01-15 DIAGNOSIS — M7541 Impingement syndrome of right shoulder: Secondary | ICD-10-CM | POA: Diagnosis not present

## 2019-01-15 DIAGNOSIS — M75111 Incomplete rotator cuff tear or rupture of right shoulder, not specified as traumatic: Secondary | ICD-10-CM | POA: Diagnosis not present

## 2019-01-15 DIAGNOSIS — M1990 Unspecified osteoarthritis, unspecified site: Secondary | ICD-10-CM | POA: Diagnosis not present

## 2019-01-15 DIAGNOSIS — Z955 Presence of coronary angioplasty implant and graft: Secondary | ICD-10-CM | POA: Diagnosis not present

## 2019-01-15 DIAGNOSIS — M7501 Adhesive capsulitis of right shoulder: Secondary | ICD-10-CM | POA: Diagnosis not present

## 2019-01-15 DIAGNOSIS — Z20828 Contact with and (suspected) exposure to other viral communicable diseases: Secondary | ICD-10-CM | POA: Diagnosis not present

## 2019-01-15 DIAGNOSIS — I2510 Atherosclerotic heart disease of native coronary artery without angina pectoris: Secondary | ICD-10-CM | POA: Diagnosis not present

## 2019-01-15 DIAGNOSIS — Z01812 Encounter for preprocedural laboratory examination: Secondary | ICD-10-CM | POA: Diagnosis not present

## 2019-01-15 DIAGNOSIS — G4733 Obstructive sleep apnea (adult) (pediatric): Secondary | ICD-10-CM | POA: Diagnosis not present

## 2019-01-15 DIAGNOSIS — Z79899 Other long term (current) drug therapy: Secondary | ICD-10-CM | POA: Diagnosis not present

## 2019-01-15 DIAGNOSIS — E119 Type 2 diabetes mellitus without complications: Secondary | ICD-10-CM | POA: Diagnosis not present

## 2019-01-15 DIAGNOSIS — Z794 Long term (current) use of insulin: Secondary | ICD-10-CM | POA: Diagnosis not present

## 2019-01-15 DIAGNOSIS — Z6836 Body mass index (BMI) 36.0-36.9, adult: Secondary | ICD-10-CM | POA: Diagnosis not present

## 2019-01-15 DIAGNOSIS — E785 Hyperlipidemia, unspecified: Secondary | ICD-10-CM | POA: Diagnosis not present

## 2019-01-15 DIAGNOSIS — Z9119 Patient's noncompliance with other medical treatment and regimen: Secondary | ICD-10-CM | POA: Diagnosis not present

## 2019-01-15 LAB — BASIC METABOLIC PANEL
Anion gap: 9 (ref 5–15)
BUN: 17 mg/dL (ref 8–23)
CO2: 25 mmol/L (ref 22–32)
Calcium: 8.4 mg/dL — ABNORMAL LOW (ref 8.9–10.3)
Chloride: 103 mmol/L (ref 98–111)
Creatinine, Ser: 0.94 mg/dL (ref 0.61–1.24)
GFR calc Af Amer: >60 mL/min (ref >60)
GFR calc non Af Amer: >60 mL/min (ref >60)
Glucose, Bld: 252 mg/dL — ABNORMAL HIGH (ref 70–99)
Potassium: 3.9 mmol/L (ref 3.5–5.1)
Sodium: 137 mmol/L (ref 135–145)

## 2019-01-15 LAB — SARS CORONAVIRUS 2 (TAT 6-24 HRS): SARS Coronavirus 2: NEGATIVE

## 2019-01-15 NOTE — Progress Notes (Signed)
Lab results reviewed with Dr. Kerin Perna, will proceed with surgery as scheduled.

## 2019-01-15 NOTE — Progress Notes (Signed)

## 2019-01-18 ENCOUNTER — Ambulatory Visit (HOSPITAL_BASED_OUTPATIENT_CLINIC_OR_DEPARTMENT_OTHER): Payer: Federal, State, Local not specified - PPO | Admitting: Anesthesiology

## 2019-01-18 ENCOUNTER — Encounter (HOSPITAL_BASED_OUTPATIENT_CLINIC_OR_DEPARTMENT_OTHER)
Admission: RE | Disposition: A | Discharge status: Home / Self Care | Payer: Self-pay | Source: Home / Self Care | Attending: Orthopedic Surgery

## 2019-01-18 ENCOUNTER — Encounter (HOSPITAL_BASED_OUTPATIENT_CLINIC_OR_DEPARTMENT_OTHER): Payer: Self-pay

## 2019-01-18 ENCOUNTER — Ambulatory Visit (HOSPITAL_BASED_OUTPATIENT_CLINIC_OR_DEPARTMENT_OTHER)
Admission: RE | Admit: 2019-01-18 | Discharge: 2019-01-18 | Disposition: A | Discharge status: Home / Self Care | Payer: Federal, State, Local not specified - PPO | Attending: Orthopedic Surgery | Admitting: Orthopedic Surgery

## 2019-01-18 ENCOUNTER — Other Ambulatory Visit: Payer: Self-pay

## 2019-01-18 DIAGNOSIS — G4733 Obstructive sleep apnea (adult) (pediatric): Secondary | ICD-10-CM | POA: Insufficient documentation

## 2019-01-18 DIAGNOSIS — I10 Essential (primary) hypertension: Secondary | ICD-10-CM | POA: Insufficient documentation

## 2019-01-18 DIAGNOSIS — Z9119 Patient's noncompliance with other medical treatment and regimen: Secondary | ICD-10-CM | POA: Insufficient documentation

## 2019-01-18 DIAGNOSIS — M1990 Unspecified osteoarthritis, unspecified site: Secondary | ICD-10-CM | POA: Insufficient documentation

## 2019-01-18 DIAGNOSIS — E669 Obesity, unspecified: Secondary | ICD-10-CM | POA: Diagnosis not present

## 2019-01-18 DIAGNOSIS — I2510 Atherosclerotic heart disease of native coronary artery without angina pectoris: Secondary | ICD-10-CM | POA: Insufficient documentation

## 2019-01-18 DIAGNOSIS — Z8546 Personal history of malignant neoplasm of prostate: Secondary | ICD-10-CM | POA: Insufficient documentation

## 2019-01-18 DIAGNOSIS — Z79899 Other long term (current) drug therapy: Secondary | ICD-10-CM | POA: Diagnosis not present

## 2019-01-18 DIAGNOSIS — Z955 Presence of coronary angioplasty implant and graft: Secondary | ICD-10-CM | POA: Insufficient documentation

## 2019-01-18 DIAGNOSIS — Z794 Long term (current) use of insulin: Secondary | ICD-10-CM | POA: Diagnosis not present

## 2019-01-18 DIAGNOSIS — M7541 Impingement syndrome of right shoulder: Secondary | ICD-10-CM | POA: Diagnosis not present

## 2019-01-18 DIAGNOSIS — E785 Hyperlipidemia, unspecified: Secondary | ICD-10-CM | POA: Insufficient documentation

## 2019-01-18 DIAGNOSIS — Z87891 Personal history of nicotine dependence: Secondary | ICD-10-CM | POA: Insufficient documentation

## 2019-01-18 DIAGNOSIS — E119 Type 2 diabetes mellitus without complications: Secondary | ICD-10-CM | POA: Insufficient documentation

## 2019-01-18 DIAGNOSIS — M24111 Other articular cartilage disorders, right shoulder: Secondary | ICD-10-CM | POA: Diagnosis not present

## 2019-01-18 DIAGNOSIS — M75111 Incomplete rotator cuff tear or rupture of right shoulder, not specified as traumatic: Secondary | ICD-10-CM | POA: Insufficient documentation

## 2019-01-18 DIAGNOSIS — S46011A Strain of muscle(s) and tendon(s) of the rotator cuff of right shoulder, initial encounter: Secondary | ICD-10-CM

## 2019-01-18 DIAGNOSIS — M7501 Adhesive capsulitis of right shoulder: Secondary | ICD-10-CM | POA: Insufficient documentation

## 2019-01-18 DIAGNOSIS — G8918 Other acute postprocedural pain: Secondary | ICD-10-CM | POA: Diagnosis not present

## 2019-01-18 DIAGNOSIS — Z6836 Body mass index (BMI) 36.0-36.9, adult: Secondary | ICD-10-CM | POA: Insufficient documentation

## 2019-01-18 HISTORY — PX: SHOULDER ARTHROSCOPY WITH DEBRIDEMENT AND BICEP TENDON REPAIR: SHX5690

## 2019-01-18 HISTORY — DX: Strain of muscle(s) and tendon(s) of the rotator cuff of right shoulder, initial encounter: S46.011A

## 2019-01-18 HISTORY — PX: SHOULDER ARTHROSCOPY WITH ROTATOR CUFF REPAIR: SHX5685

## 2019-01-18 LAB — GLUCOSE, CAPILLARY
Glucose-Capillary: 110 mg/dL — ABNORMAL HIGH (ref 70–99)
Glucose-Capillary: 129 mg/dL — ABNORMAL HIGH (ref 70–99)

## 2019-01-18 SURGERY — SHOULDER ARTHROSCOPY WITH DEBRIDEMENT AND BICEP TENDON REPAIR
Anesthesia: General | Site: Shoulder | Laterality: Right

## 2019-01-18 MED ORDER — ACETAMINOPHEN 500 MG PO TABS
ORAL_TABLET | ORAL | Status: AC
  Filled 2019-01-18: qty 2

## 2019-01-18 MED ORDER — FENTANYL CITRATE (PF) 100 MCG/2ML IJ SOLN
INTRAMUSCULAR | Status: AC
  Filled 2019-01-18: qty 2

## 2019-01-18 MED ORDER — ACETAMINOPHEN 160 MG/5ML PO SOLN: 325.0000 mg | ORAL | Status: DC | PRN

## 2019-01-18 MED ORDER — ACETAMINOPHEN 500 MG PO TABS
1000.0000 mg | ORAL_TABLET | Freq: Once | ORAL | Status: AC
  Administered 2019-01-18: 1000 mg via ORAL

## 2019-01-18 MED ORDER — OXYCODONE HCL 5 MG PO TABS: 5.0000 mg | ORAL_TABLET | ORAL | 0 refills | Status: DC | PRN

## 2019-01-18 MED ORDER — ROCURONIUM BROMIDE 10 MG/ML (PF) SYRINGE
PREFILLED_SYRINGE | INTRAVENOUS | Status: AC
  Filled 2019-01-18: qty 10

## 2019-01-18 MED ORDER — CEFAZOLIN SODIUM-DEXTROSE 2-4 GM/100ML-% IV SOLN
INTRAVENOUS | Status: AC
  Filled 2019-01-18: qty 100

## 2019-01-18 MED ORDER — SENNA-DOCUSATE SODIUM 8.6-50 MG PO TABS: 2.0000 | ORAL_TABLET | Freq: Every day | ORAL | 1 refills | Status: DC

## 2019-01-18 MED ORDER — LIDOCAINE 2% (20 MG/ML) 5 ML SYRINGE
INTRAMUSCULAR | Status: AC
  Filled 2019-01-18: qty 5

## 2019-01-18 MED ORDER — CLONIDINE HCL (ANALGESIA) 100 MCG/ML EP SOLN
EPIDURAL | Status: DC | PRN
  Administered 2019-01-18: 100 ug

## 2019-01-18 MED ORDER — PHENYLEPHRINE 40 MCG/ML (10ML) SYRINGE FOR IV PUSH (FOR BLOOD PRESSURE SUPPORT)
PREFILLED_SYRINGE | INTRAVENOUS | Status: AC
  Filled 2019-01-18: qty 10

## 2019-01-18 MED ORDER — ONDANSETRON HCL 4 MG PO TABS: 4.0000 mg | ORAL_TABLET | Freq: Three times a day (TID) | ORAL | 0 refills | Status: DC | PRN

## 2019-01-18 MED ORDER — DEXAMETHASONE SODIUM PHOSPHATE 10 MG/ML IJ SOLN
INTRAMUSCULAR | Status: AC
  Filled 2019-01-18: qty 1

## 2019-01-18 MED ORDER — SUCCINYLCHOLINE CHLORIDE 200 MG/10ML IV SOSY
PREFILLED_SYRINGE | INTRAVENOUS | Status: AC
  Filled 2019-01-18: qty 10

## 2019-01-18 MED ORDER — FENTANYL CITRATE (PF) 100 MCG/2ML IJ SOLN
50.0000 ug | INTRAMUSCULAR | Status: DC | PRN
  Administered 2019-01-18: 100 ug via INTRAVENOUS

## 2019-01-18 MED ORDER — EPHEDRINE SULFATE 50 MG/ML IJ SOLN
INTRAMUSCULAR | Status: DC | PRN
  Administered 2019-01-18: 15 mg via INTRAVENOUS
  Administered 2019-01-18: 10 mg via INTRAVENOUS

## 2019-01-18 MED ORDER — PROPOFOL 10 MG/ML IV BOLUS
INTRAVENOUS | Status: DC | PRN
  Administered 2019-01-18: 150 mg via INTRAVENOUS

## 2019-01-18 MED ORDER — ACETAMINOPHEN 325 MG PO TABS
ORAL_TABLET | ORAL | Status: AC
  Filled 2019-01-18: qty 3

## 2019-01-18 MED ORDER — SUGAMMADEX SODIUM 500 MG/5ML IV SOLN
INTRAVENOUS | Status: AC
  Filled 2019-01-18: qty 5

## 2019-01-18 MED ORDER — MIDAZOLAM HCL 2 MG/2ML IJ SOLN
1.0000 mg | INTRAMUSCULAR | Status: DC | PRN
  Administered 2019-01-18: 13:00:00 2 mg via INTRAVENOUS

## 2019-01-18 MED ORDER — LACTATED RINGERS IV SOLN
INTRAVENOUS | Status: DC
  Administered 2019-01-18 (×2): via INTRAVENOUS

## 2019-01-18 MED ORDER — PHENYLEPHRINE HCL (PRESSORS) 10 MG/ML IV SOLN
INTRAVENOUS | Status: DC | PRN
  Administered 2019-01-18 (×2): 80 ug via INTRAVENOUS

## 2019-01-18 MED ORDER — BACLOFEN 10 MG PO TABS: 10.0000 mg | ORAL_TABLET | Freq: Three times a day (TID) | ORAL | 0 refills | Status: DC

## 2019-01-18 MED ORDER — TRANEXAMIC ACID-NACL 1000-0.7 MG/100ML-% IV SOLN: 1000.0000 mg | INTRAVENOUS | Status: DC

## 2019-01-18 MED ORDER — ACETAMINOPHEN 325 MG PO TABS: 325.0000 mg | ORAL_TABLET | ORAL | Status: DC | PRN

## 2019-01-18 MED ORDER — EPHEDRINE 5 MG/ML INJ
INTRAVENOUS | Status: AC
  Filled 2019-01-18: qty 10

## 2019-01-18 MED ORDER — BUPIVACAINE-EPINEPHRINE (PF) 0.5% -1:200000 IJ SOLN
INTRAMUSCULAR | Status: DC | PRN
  Administered 2019-01-18: 30 mL via PERINEURAL

## 2019-01-18 MED ORDER — ONDANSETRON HCL 4 MG/2ML IJ SOLN: 4.0000 mg | Freq: Once | INTRAMUSCULAR | Status: DC | PRN

## 2019-01-18 MED ORDER — OXYCODONE HCL 5 MG PO TABS: 5.0000 mg | ORAL_TABLET | Freq: Once | ORAL | Status: DC | PRN

## 2019-01-18 MED ORDER — ONDANSETRON HCL 4 MG/2ML IJ SOLN
INTRAMUSCULAR | Status: AC
  Filled 2019-01-18: qty 2

## 2019-01-18 MED ORDER — MIDAZOLAM HCL 2 MG/2ML IJ SOLN
INTRAMUSCULAR | Status: AC
  Filled 2019-01-18: qty 2

## 2019-01-18 MED ORDER — CEFAZOLIN SODIUM-DEXTROSE 2-4 GM/100ML-% IV SOLN
2.0000 g | INTRAVENOUS | Status: AC
  Administered 2019-01-18: 14:00:00 2 g via INTRAVENOUS

## 2019-01-18 MED ORDER — CHLORHEXIDINE GLUCONATE 4 % EX LIQD: 60.0000 mL | Freq: Once | CUTANEOUS | Status: DC

## 2019-01-18 MED ORDER — ROCURONIUM BROMIDE 100 MG/10ML IV SOLN
INTRAVENOUS | Status: DC | PRN
  Administered 2019-01-18: 90 mg via INTRAVENOUS

## 2019-01-18 MED ORDER — FENTANYL CITRATE (PF) 100 MCG/2ML IJ SOLN: 25.0000 ug | INTRAMUSCULAR | Status: DC | PRN

## 2019-01-18 MED ORDER — OXYCODONE HCL 5 MG/5ML PO SOLN: 5.0000 mg | Freq: Once | ORAL | Status: DC | PRN

## 2019-01-18 MED ORDER — MEPERIDINE HCL 25 MG/ML IJ SOLN: 6.2500 mg | INTRAMUSCULAR | Status: DC | PRN

## 2019-01-18 SURGICAL SUPPLY — 57 items
ANCHOR SUT BIO SW 4.75X19.1 (Anchor) ×2 IMPLANT
BLADE EXCALIBUR 4.0X13 (MISCELLANEOUS) IMPLANT
BLADE SURG 15 STRL LF DISP TIS (BLADE) IMPLANT
BLADE SURG 15 STRL SS (BLADE)
BURR OVAL 8 FLU 5.0X13 (MISCELLANEOUS) ×2 IMPLANT
CANNULA 5.75X71 LONG (CANNULA) ×2 IMPLANT
CANNULA TWIST IN 8.25X7CM (CANNULA) ×2 IMPLANT
CLSR STERI-STRIP ANTIMIC 1/2X4 (GAUZE/BANDAGES/DRESSINGS) ×2 IMPLANT
COVER WAND RF STERILE (DRAPES) IMPLANT
DECANTER SPIKE VIAL GLASS SM (MISCELLANEOUS) IMPLANT
DISSECTOR  3.8MM X 13CM (MISCELLANEOUS) ×1
DISSECTOR 3.8MM X 13CM (MISCELLANEOUS) ×1 IMPLANT
DRAPE HALF SHEET 70X43 (DRAPES) ×2 IMPLANT
DRAPE IMP U-DRAPE 54X76 (DRAPES) ×2 IMPLANT
DRAPE INCISE IOBAN 66X45 STRL (DRAPES) ×2 IMPLANT
DRAPE SHOULDER BEACH CHAIR (DRAPES) ×2 IMPLANT
DRAPE U-SHAPE 47X51 STRL (DRAPES) ×2 IMPLANT
DRSG PAD ABDOMINAL 8X10 ST (GAUZE/BANDAGES/DRESSINGS) ×2 IMPLANT
DURAPREP 26ML APPLICATOR (WOUND CARE) ×2 IMPLANT
ELECT REM PT RETURN 9FT ADLT (ELECTROSURGICAL)
ELECTRODE REM PT RTRN 9FT ADLT (ELECTROSURGICAL) IMPLANT
FIBERSTICK 2 (SUTURE) IMPLANT
GAUZE SPONGE 4X4 12PLY STRL (GAUZE/BANDAGES/DRESSINGS) ×2 IMPLANT
GLOVE BIO SURGEON STRL SZ 6.5 (GLOVE) ×2 IMPLANT
GLOVE BIO SURGEON STRL SZ8 (GLOVE) ×2 IMPLANT
GLOVE BIOGEL PI IND STRL 7.0 (GLOVE) ×2 IMPLANT
GLOVE BIOGEL PI IND STRL 8 (GLOVE) ×2 IMPLANT
GLOVE BIOGEL PI INDICATOR 7.0 (GLOVE) ×2
GLOVE BIOGEL PI INDICATOR 8 (GLOVE) ×2
GLOVE ORTHO TXT STRL SZ7.5 (GLOVE) ×2 IMPLANT
GOWN STRL REUS W/ TWL LRG LVL3 (GOWN DISPOSABLE) ×1 IMPLANT
GOWN STRL REUS W/ TWL XL LVL3 (GOWN DISPOSABLE) ×2 IMPLANT
GOWN STRL REUS W/TWL LRG LVL3 (GOWN DISPOSABLE) ×1
GOWN STRL REUS W/TWL XL LVL3 (GOWN DISPOSABLE) ×2
IMMOBILIZER SHOULDER FOAM XLGE (SOFTGOODS) ×2 IMPLANT
LASSO 90 CVE QUICKPAS (DISPOSABLE) IMPLANT
MANIFOLD NEPTUNE II (INSTRUMENTS) ×2 IMPLANT
NEEDLE SCORPION MULTI FIRE (NEEDLE) ×2 IMPLANT
PACK ARTHROSCOPY DSU (CUSTOM PROCEDURE TRAY) ×2 IMPLANT
PACK BASIN DAY SURGERY FS (CUSTOM PROCEDURE TRAY) ×2 IMPLANT
PORT APPOLLO RF 90DEGREE MULTI (SURGICAL WAND) ×2 IMPLANT
SLEEVE SCD COMPRESS KNEE MED (MISCELLANEOUS) ×2 IMPLANT
SLING ARM FOAM STRAP LRG (SOFTGOODS) IMPLANT
SUPPORT WRAP ARM LG (MISCELLANEOUS) ×2 IMPLANT
SUT FIBERWIRE #2 38 T-5 BLUE (SUTURE)
SUT MNCRL AB 4-0 PS2 18 (SUTURE) ×2 IMPLANT
SUT PDS AB 1 CT  36 (SUTURE)
SUT PDS AB 1 CT 36 (SUTURE) IMPLANT
SUT TIGER TAPE 7 IN WHITE (SUTURE) ×2 IMPLANT
SUT VIC AB 3-0 SH 27 (SUTURE)
SUT VIC AB 3-0 SH 27X BRD (SUTURE) IMPLANT
SUTURE FIBERWR #2 38 T-5 BLUE (SUTURE) IMPLANT
TAPE FIBER 2MM 7IN #2 BLUE (SUTURE) IMPLANT
TOWEL GREEN STERILE FF (TOWEL DISPOSABLE) ×2 IMPLANT
TUBE CONNECTING 20X1/4 (TUBING) IMPLANT
TUBING ARTHROSCOPY IRRIG 16FT (MISCELLANEOUS) ×4 IMPLANT
WATER STERILE IRR 1000ML POUR (IV SOLUTION) ×2 IMPLANT

## 2019-01-18 NOTE — Anesthesia Preprocedure Evaluation (Addendum)
Anesthesia Evaluation  Patient identified by MRN, date of birth, ID band Patient awake    Reviewed: Allergy & Precautions, NPO status , Patient's Chart, lab work & pertinent test results, reviewed documented beta blocker date and time   History of Anesthesia Complications Negative for: history of anesthetic complications  Airway Mallampati: II  TM Distance: >3 FB Neck ROM: Full    Dental   Pulmonary sleep apnea , former smoker,    Pulmonary exam normal        Cardiovascular hypertension, Pt. on medications and Pt. on home beta blockers + CAD and + Cardiac Stents  Normal cardiovascular exam+ dysrhythmias Atrial Fibrillation   ECHO  Left ventricle: The cavity size was normal. Wall thickness was   increased in a pattern of mild LVH. Systolic function was normal.   The estimated ejection fraction was in the range of 55% to 60%.   Wall motion was normal; there were no regional wall motion   abnormalities. Doppler parameters are consistent with abnormal   left ventricular relaxation (grade 1 diastolic dysfunction). - Aorta: Aortic root dimension: 41 mm (ED). - Ascending aorta: The ascending aorta was mildly dilated.   Neuro/Psych negative neurological ROS  negative psych ROS   GI/Hepatic Neg liver ROS, GERD  ,  Endo/Other  diabetes, Type 2, Oral Hypoglycemic Agents, Insulin Dependent  Renal/GU negative Renal ROS  negative genitourinary   Musculoskeletal  (+) Arthritis , Osteoarthritis,    Abdominal   Peds  Hematology  (+) REFUSES BLOOD PRODUCTS, JEHOVAH'S WITNESS  Anesthesia Other Findings   Reproductive/Obstetrics                            Anesthesia Physical  Anesthesia Plan  ASA: III  Anesthesia Plan: General   Post-op Pain Management: GA combined w/ Regional for post-op pain   Induction: Intravenous  PONV Risk Score and Plan: 2 and Ondansetron, Midazolam and Treatment may vary due  to age or medical condition  Airway Management Planned: Oral ETT  Additional Equipment: None  Intra-op Plan:   Post-operative Plan: Extubation in OR  Informed Consent: I have reviewed the patients History and Physical, chart, labs and discussed the procedure including the risks, benefits and alternatives for the proposed anesthesia with the patient or authorized representative who has indicated his/her understanding and acceptance.     Dental advisory given  Plan Discussed with: CRNA, Anesthesiologist and Surgeon  Anesthesia Plan Comments: (  )        Anesthesia Quick Evaluation

## 2019-01-18 NOTE — Anesthesia Procedure Notes (Signed)
Anesthesia Regional Block: Interscalene brachial plexus block   Pre-Anesthetic Checklist: ,, timeout performed, Correct Patient, Correct Site, Correct Laterality, Correct Procedure, Correct Position, site marked, Risks and benefits discussed,  Surgical consent,  Pre-op evaluation,  At surgeon's request and post-op pain management  Laterality: Right  Prep: chloraprep       Needles:  Injection technique: Single-shot  Needle Type: Echogenic Stimulator Needle     Needle Length: 5cm  Needle Gauge: 22     Additional Needles:   Procedures:, nerve stimulator,,, ultrasound used (permanent image in chart),,,,   Nerve Stimulator or Paresthesia:  Response: hand, 0.45 mA,   Additional Responses:   Narrative:  Start time: 01/18/2019 1:30 PM End time: 01/18/2019 1:40 PM Injection made incrementally with aspirations every 5 mL.  Performed by: Personally  Anesthesiologist: Janeece Riggers, MD  Additional Notes: Functioning IV was confirmed and monitors were applied.  A 28mm 22ga Arrow echogenic stimulator needle was used. Sterile prep and drape,hand hygiene and sterile gloves were used. Ultrasound guidance: relevant anatomy identified, needle position confirmed, local anesthetic spread visualized around nerve(s)., vascular puncture avoided.  Image printed for medical record. Negative aspiration and negative test dose prior to incremental administration of local anesthetic. The patient tolerated the procedure well.

## 2019-01-18 NOTE — Op Note (Signed)
01/18/2019  3:25 PM  PATIENT:  [name]    PRE-OPERATIVE DIAGNOSIS:    1.  Right shoulder high-grade partial supraspinatus tear 2.  Right shoulder anterior and superior labral fraying 3.  Right shoulder impingement syndrome  POST-OPERATIVE DIAGNOSIS:    1.  Right shoulder high-grade partial supraspinatus tear 2.  Right shoulder anterior and superior labral fraying 3.  Right shoulder impingement syndrome 4.  Right shoulder adhesive capsulitis  PROCEDURE:  Right shoulder arthroscopy with extensive debridement, bursectomy, acromioplasty, debridement of labrum, supraspinatus tendon, with arthroscopic rotator cuff repair and manipulation under anesthesia  SURGEON:  Johnny Bridge, MD  PHYSICIAN ASSISTANT: Joya Gaskins, OPA-C, present and scrubbed throughout the case, critical for completion in a timely fashion, and for retraction, instrumentation, and closure.  ANESTHESIA:   General with Exparel interscalene block  PREOPERATIVE INDICATIONS:  [name] is a  68 y.o. male with a diagnosis of RIGHT SHOULDER IMPINGEMENT STRAIN NOF ROTATOR CUFF MUSCLES AND TENDONS who failed conservative measures and elected for surgical management.    The risks benefits and alternatives were discussed with the patient preoperatively including but not limited to the risks of infection, bleeding, nerve injury, cardiopulmonary complications, the need for revision surgery, among others, and the patient was willing to proceed.  ESTIMATED BLOOD LOSS: Minimal  OPERATIVE IMPLANTS: Arthrex bio composite 4.75 mm swivel lock x1 with an inverted fiber tape  OPERATIVE FINDINGS: The patient has 0 to 100 degrees of forward flexion during examination under anesthesia.  There was significant stiffness.  Manipulation yielded a substantial lysis of adhesions and I was able to get nearly full motion.  Interestingly the capsule did not look that hemorrhagic, or injected in the joint.  The glenohumeral articular  cartilage was in good condition.  The biceps tendon was completely intact, as well as the subscapularis, and the supraspinatus from below was totally intact as well.  The superior labrum had some type II tearing.  From above he had some moderate bursitis, he had some scarring left over from his previous CC ligament reconstruction from many years ago, even 40 years ago.  He had some undersurface CA ligament fraying.  Type II acromion with impingement signs.  There was a affectively full-thickness supraspinatus tear although the capsule was intact below but the tendon from above was completely torn off of the bone.  The tendon quality was mediocre, bone quality was excellent.  OPERATIVE PROCEDURE: The patient was brought to the operating room and placed in the supine position.  General anesthesia was administered.  He was placed in the beachchair position, the right upper extremity was examined, timeout performed, it was manipulated, the above named findings were noted.  The upper extremity was then prepped and draped in usual sterile fashion and timeout was performed and diagnostic arthroscopy was carried out and I used the shaver to debride the superior labrum as well as the anterior labrum, and then I went to the subacromial space.  Complete bursectomy was carried out, followed by CA ligament release, acromioplasty, and a light tuberplasty.  I used a scorpion suture passer, and passed a fiber tape anteriorly and posteriorly, and then brought it around the corner to a lateral anchor, restoring the anatomic footprint.  Excellent fixation was achieved.  The instruments were removed, the sutures cut, the safety stitch removed, confirmation of appropriate acromioplasty made from the lateral view, and the portals were closed with Monocryl followed by Steri-Strips and sterile gauze.  He was awakened and returned to  the PACU in stable and satisfactory condition.  There were no complications and he tolerated the  procedure well.

## 2019-01-18 NOTE — Anesthesia Procedure Notes (Signed)
Procedure Name: Intubation Date/Time: 01/18/2019 2:04 PM Performed by: Willa Frater, CRNA Pre-anesthesia Checklist: Patient identified, Emergency Drugs available, Suction available and Patient being monitored Patient Re-evaluated:Patient Re-evaluated prior to induction Oxygen Delivery Method: Circle system utilized Preoxygenation: Pre-oxygenation with 100% oxygen Induction Type: IV induction Ventilation: Two handed mask ventilation required Laryngoscope Size: Miller and 2 Grade View: Grade II Tube type: Oral Tube size: 8.0 mm Number of attempts: 2 Airway Equipment and Method: Stylet and Oral airway Placement Confirmation: ETT inserted through vocal cords under direct vision,  positive ETCO2 and breath sounds checked- equal and bilateral Secured at: 25 cm Tube secured with: Tape Dental Injury: Teeth and Oropharynx as per pre-operative assessment  Comments: DL x1 with grade III view with Mac 4; DL with Sabra Heck 2 by MD with Grade IIa view. ETT noted to be esophageal.  ETT removed and DL with Sabra Heck 2-Grade IIa view. ETT placement verified with equal BBS and +ETCO2 waveform.

## 2019-01-18 NOTE — Anesthesia Postprocedure Evaluation (Signed)
Anesthesia Post Note  Patient: [name]  Procedure(s) Performed: RIGHT SHOULDER ARTHROSCOPY WITH DEBRIDEMENT AND DECOMPRSSION SUBACROMIAL PARTIAL ACROMIOPLASTY (Right Shoulder) SHOULDER ARTHROSCOPY WITH ROTATOR CUFF REPAIR (Right Shoulder)     Patient location during evaluation: PACU Anesthesia Type: General Level of consciousness: sedated Pain management: pain level controlled Vital Signs Assessment: post-procedure vital signs reviewed and stable Respiratory status: spontaneous breathing and respiratory function stable Cardiovascular status: stable Postop Assessment: no apparent nausea or vomiting Anesthetic complications: no    Last Vitals:  Vitals:   01/18/19 1600 01/18/19 1620  BP: (!) 176/92 (!) 172/81  Pulse: 73 72  Resp: 18 18  Temp:  36.5 C  SpO2: 95% 96%    Last Pain:  Vitals:   01/18/19 1620  TempSrc:   PainSc: 0-No pain                 Kerney Hopfensperger DANIEL

## 2019-01-18 NOTE — Discharge Instructions (Signed)
Diet: As you were doing prior to hospitalization   Shower:  May shower but keep the wounds dry, use an occlusive plastic wrap, NO SOAKING IN TUB.  If the bandage gets wet, change with a clean dry gauze.  If you have a splint on, leave the splint in place and keep the splint dry with a plastic bag.  Dressing:  You may change your dressing 3-5 days after surgery, unless you have a splint.  If you have a splint, then just leave the splint in place and we will change your bandages during your first follow-up appointment.    If you had hand or foot surgery, we will plan to remove your stitches in about 2 weeks in the office.  For all other surgeries, there are sticky tapes (steri-strips) on your wounds and all the stitches are absorbable.  Leave the steri-strips in place when changing your dressings, they will peel off with time, usually 2-3 weeks.  Activity:  Increase activity slowly as tolerated, but follow the weight bearing instructions below.  The rules on driving is that you can not be taking narcotics while you drive, and you must feel in control of the vehicle.    Weight Bearing:   Sling at all times except hygiene.    To prevent constipation: you may use a stool softener such as -  Colace (over the counter) 100 mg by mouth twice a day  Drink plenty of fluids (prune juice may be helpful) and high fiber foods Miralax (over the counter) for constipation as needed.    Itching:  If you experience itching with your medications, try taking only a single pain pill, or even half a pain pill at a time.  You may take up to 10 pain pills per day, and you can also use benadryl over the counter for itching or also to help with sleep.   Precautions:  If you experience chest pain or shortness of breath - call 911 immediately for transfer to the hospital emergency department!!  If you develop a fever greater that 101 F, purulent drainage from wound, increased redness or drainage from wound, or calf pain --  Call the office at [phone]                                                Follow- Up Appointment:  Please call for an appointment to be seen in 2 weeks [place] - [phone]   No Tylenol until 7pm   Post Anesthesia Home Care Instructions  Activity: Get plenty of rest for the remainder of the day. A responsible individual must stay with you for 24 hours following the procedure.  For the next 24 hours, DO NOT: -Drive a car -Paediatric nurse -Drink alcoholic beverages -Take any medication unless instructed by your physician -Make any legal decisions or sign important papers.  Meals: Start with liquid foods such as gelatin or soup. Progress to regular foods as tolerated. Avoid greasy, spicy, heavy foods. If nausea and/or vomiting occur, drink only clear liquids until the nausea and/or vomiting subsides. Call your physician if vomiting continues.  Special Instructions/Symptoms: Your throat may feel dry or sore from the anesthesia or the breathing tube placed in your throat during surgery. If this causes discomfort, gargle with warm salt water. The discomfort should disappear within 24 hours.  If you had a scopolamine patch  placed behind your ear for the management of post- operative nausea and/or vomiting:  1. The medication in the patch is effective for 72 hours, after which it should be removed.  Wrap patch in a tissue and discard in the trash. Wash hands thoroughly with soap and water. 2. You may remove the patch earlier than 72 hours if you experience unpleasant side effects which may include dry mouth, dizziness or visual disturbances. 3. Avoid touching the patch. Wash your hands with soap and water after contact with the patch.    Regional Anesthesia Blocks  1. Numbness or the inability to move the "blocked" extremity may last from 3-48 hours after placement. The length of time depends on the medication injected and your individual response to the medication. If the  numbness is not going away after 48 hours, call your surgeon.  2. The extremity that is blocked will need to be protected until the numbness is gone and the  Strength has returned. Because you cannot feel it, you will need to take extra care to avoid injury. Because it may be weak, you may have difficulty moving it or using it. You may not know what position it is in without looking at it while the block is in effect.  3. For blocks in the legs and feet, returning to weight bearing and walking needs to be done carefully. You will need to wait until the numbness is entirely gone and the strength has returned. You should be able to move your leg and foot normally before you try and bear weight or walk. You will need someone to be with you when you first try to ensure you do not fall and possibly risk injury.  4. Bruising and tenderness at the needle site are common side effects and will resolve in a few days.  5. Persistent numbness or new problems with movement should be communicated to the surgeon or the [facility] [phone] [place] [phone]).Information for Discharge Teaching: EXPAREL (bupivacaine liposome injectable suspension)   Your surgeon or anesthesiologist gave you EXPAREL(bupivacaine) to help control your pain after surgery.   EXPAREL is a local anesthetic that provides pain relief by numbing the tissue around the surgical site.  EXPAREL is designed to release pain medication over time and can control pain for up to 72 hours.  Depending on how you respond to EXPAREL, you may require less pain medication during your recovery.  Possible side effects:  Temporary loss of sensation or ability to move in the area where bupivacaine was injected.  Nausea, vomiting, constipation  Rarely, numbness and tingling in your mouth or lips, lightheadedness, or anxiety may occur.  Call your doctor right away if you think you may be experiencing any of these  sensations, or if you have other questions regarding possible side effects.  Follow all other discharge instructions given to you by your surgeon or nurse. Eat a healthy diet and drink plenty of water or other fluids.  If you return to the hospital for any reason within 96 hours following the administration of EXPAREL, it is important for health care providers to know that you have received this anesthetic. A teal colored band has been placed on your arm with the date, time and amount of EXPAREL you have received in order to alert and inform your health care providers. Please leave this armband in place for the full 96 hours following administration, and then you may remove the band.

## 2019-01-18 NOTE — H&P (Signed)
PREOPERATIVE H&P  Chief Complaint: Right shoulder pain  HPI: [name] is a 68 y.o. male who presents for preoperative history and physical with a diagnosis of right shoulder high-grade partial leading supraspinatus tear. Symptoms are rated as moderate to severe, and have been worsening.  This is significantly impairing activities of daily living.  He has elected for surgical management.  He rates pain 10/10, laterally, denies AC joint pain.  He has difficulty lifting his arm.  He is left-hand dominant.  Past Medical History:  Diagnosis Date  . Adenomatous colon polyp 2011  . Anemia   . Angina pectoris ([place]) 05/01/2015   med rx for 95% OM (u/a to access due to tortuosity), 95% D1 and other moderate CAD at cath  . Arthritis    "back, knees" (05/01/2015)  . Chest pain    pleuritic  . Chronic lower back pain   . Complex tear of medial meniscus of left knee 09/14/2018  . Coronary artery disease   . Depression   . GERD (gastroesophageal reflux disease)   . HTN (hypertension)   . Hyperlipidemia   . Obstructive sleep apnea    noncompliant with CPAP  . Osteoarthritis   . Pneumonia ~ 2014 X 1  . Prostate cancer ([place])   . Refusal of blood transfusions as patient is Jehovah's Witness   . Type II or unspecified type diabetes mellitus without mention of complication, not stated as uncontrolled    Past Surgical History:  Procedure Laterality Date  . CARDIAC CATHETERIZATION  2004   patent coronary arteries  . CARDIAC CATHETERIZATION  05/01/2015   "tried to put stent in but couldn't"  . CARDIAC CATHETERIZATION N/A 05/01/2015   Procedure: Left Heart Cath and Coronary Angiography;  Surgeon: Belva Crome, MD; LAD 60%, oD1 90%, pD1 70%, D2 70%, CFX patent stent, 30% distal to prev stent, pRCA 20%, OM1 90/95%, NL LV  . CARDIAC CATHETERIZATION N/A 05/01/2015   Procedure: Coronary Stent Intervention;  Surgeon: Belva Crome, MD;  Unsuccessful PCI OM due to tortuosity  . CARDIAC CATHETERIZATION  N/A 11/06/2014   Procedure: Left Heart Cath and Coronary Angiography;  Surgeon: Belva Crome, MD;  Location: [facility];  Service: Cardiovascular;  Laterality: N/A;  . CLOSED REDUCTION SHOULDER DISLOCATION Right ~ 1975   "& reattached muscle"  . COLONOSCOPY W/ BIOPSIES AND POLYPECTOMY  X 2  . ESOPHAGOGASTRODUODENOSCOPY ENDOSCOPY    . KNEE ARTHROSCOPY WITH MEDIAL MENISECTOMY Left 09/14/2018   Procedure: LEFT KNEE ARTHROSCOPY CHONDROPLASY,  WITH MEDIAL MENISECTOMY;  Surgeon: Marchia Bond, MD;  Location: [facility];  Service: Orthopedics;  Laterality: Left;  . PILONIDAL CYST EXCISION     Social History   Socioeconomic History  . Marital status: Married    Spouse name: [name]  . Number of children: 4  . Years of education: Not on file  . Highest education level: Not on file  Occupational History  . Occupation: Retired    Fish farm manager: UNEMPLOYED  Social Needs  . Financial resource strain: Not on file  . Food insecurity    Worry: Not on file    Inability: Not on file  . Transportation needs    Medical: No    Non-medical: No  Tobacco Use  . Smoking status: Former Smoker    Packs/day: 0.50    Years: 10.00    Pack years: 5.00    Types: Cigarettes    Quit date: 04/06/1983    Years since quitting: 35.8  .  Smokeless tobacco: Never Used  Substance and Sexual Activity  . Alcohol use: Yes    Alcohol/week: 0.0 standard drinks    Comment: `/26/2017 "might drink a beer q couple months mostly; summertime I might drink 2-3 beers/week"  . Drug use: No  . Sexual activity: Not Currently  Lifestyle  . Physical activity    Days per week: Not on file    Minutes per session: Not on file  . Stress: Not on file  Relationships  . Social Herbalist on phone: Not on file    Gets together: Not on file    Attends religious service: Not on file    Active member of club or organization: Not on file    Attends meetings of clubs or organizations: Not on file     Relationship status: Not on file  Other Topics Concern  . Not on file  Social History Narrative   Lives in [place] with spouse Giddeon Bessinger).  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   Family History  Problem Relation Age of Onset  . Coronary artery disease Other        CABG  . Coronary artery disease Mother   . Stroke Mother   . Heart disease Mother   . Diabetes Mother   . Other Father   . Diabetes Sister   . Diabetes Maternal Uncle        x2  . Alcohol abuse Other   . Diabetes Paternal Uncle        x2  . Colon cancer Paternal Uncle   . Colon polyps Neg Hx   . Esophageal cancer Neg Hx    Allergies  Allergen Reactions  . Statins Other (See Comments)    REACTION: joint pain Lipitor- headaches Has also tried Livalo, pravachol, zetia, crestor, welchol  . Imdur [Isosorbide Dinitrate]     Headache - "violent"   Prior to Admission medications   Medication Sig Start Date End Date Taking? Authorizing Provider  Blood Glucose Monitoring Suppl (RELION PREMIER BLU MONITOR) DEVI 1 Device by Does not apply route daily. 07/12/16  Yes [name], MD  Evolocumab (REPATHA SURECLICK) [id number] MG/ML SOAJ Inject 1 pen into the skin every 14 (fourteen) days. 06/29/18  Yes Belva Crome, MD  glipiZIDE (GLUCOTROL XL) 5 MG 24 hr tablet TAKE 1 TABLET BY MOUTH TWICE A DAY 07/17/18  Yes [name], MD  glucose blood test strip Use as instructed to check sugar 2 times daily 07/12/16  Yes [name], MD  hydrochlorothiazide (HYDRODIURIL) 25 MG tablet Take 1 tablet (25 mg total) by mouth daily. 05/05/18  Yes Belva Crome, MD  Insulin Pen Needle (NOVOFINE PLUS) 32G X 4 MM MISC Use 1x a day 05/09/14  Yes [name], MD  LEVEMIR FLEXTOUCH 100 UNIT/ML Pen INJECT 16 UNITS INTO THE SKIN DAILY AT 10 PM. 07/27/18  Yes [name], MD  losartan (COZAAR) 100 MG tablet Take 1 tablet (100 mg total) by mouth daily. 05/05/18  Yes Belva Crome, MD  metFORMIN (GLUCOPHAGE) 500 MG tablet TAKE 2 TABLETS (1,000 MG TOTAL) BY MOUTH 2 (TWO) TIMES DAILY WITH A MEAL. 10/17/18  Yes [name], MD  metoprolol tartrate (LOPRESSOR) 50 MG tablet Take 1 tablet (50 mg total) by mouth 2 (two) times daily. 04/18/18  Yes Belva Crome, MD  nitroGLYCERIN (NITROSTAT) 0.4 MG SL tablet PLACE ONE TABLET UNDER THE TONGUE EVERY 5  MINUTES X3 DOSES AS NEEDED FOR CHEST PAIN 09/27/18   Belva Crome, MD     Positive ROS: All other systems have been reviewed and were otherwise negative with the exception of those mentioned in the HPI and as above.  Physical Exam: General: Alert, no acute distress Cardiovascular: No pedal edema Respiratory: No cyanosis, no use of accessory musculature GI: No organomegaly, abdomen is soft and non-tender Skin: No lesions in the area of chief complaint Neurologic: Sensation intact distally Psychiatric: Patient is competent for consent with normal mood and affect Lymphatic: No axillary or cervical lymphadenopathy  MUSCULOSKELETAL: Right shoulder active motion 0 to 160 degrees with weakness with supraspinatus testing and positive impingement signs.  No pain over the [facility] joint.  Assessment: Right shoulder impingement syndrome with partial thickness rotator cuff tear, possible full-thickness tear, history of CC ligament reconstruction   Plan: Plan for Procedure(s): Right shoulder arthroscopy, extensive debridement, possible rotator cuff repair, I will also assess his acromioclavicular joint, although clinically he does not seem to have much pain in that location.  He has risk factors including his diabetes, hypertension, and multiple other comorbidities as indicated above.  The risks benefits and alternatives were discussed with the patient including but not limited to the risks of nonoperative treatment, versus surgical intervention including infection, bleeding, nerve injury,  blood clots, cardiopulmonary complications,  morbidity, mortality, among others, and they were willing to proceed.     Johnny Bridge, MD Cell [phone]   01/18/2019 1:31 PM

## 2019-01-18 NOTE — Progress Notes (Signed)
Assisted Dr. Ambrose Pancoast with right, ultrasound guided, interscalene  block. Side rails up, monitors on throughout procedure. See vital signs in flow sheet. Tolerated Procedure well.

## 2019-01-18 NOTE — Transfer of Care (Signed)
Immediate Anesthesia Transfer of Care Note  Patient: [name]  Procedure(s) Performed: RIGHT SHOULDER ARTHROSCOPY WITH DEBRIDEMENT AND DECOMPRSSION SUBACROMIAL PARTIAL ACROMIOPLASTY (Right Shoulder) SHOULDER ARTHROSCOPY WITH ROTATOR CUFF REPAIR (Right Shoulder)  Patient Location: PACU  Anesthesia Type:General and Regional  Level of Consciousness: awake, alert  and oriented  Airway & Oxygen Therapy: Patient Spontanous Breathing and Patient connected to face mask oxygen  Post-op Assessment: Report given to RN and Post -op Vital signs reviewed and stable  Post vital signs: Reviewed and stable  Last Vitals:  Vitals Value Taken Time  BP    Temp    Pulse 73 01/18/19 1530  Resp    SpO2 100 % 01/18/19 1530  Vitals shown include unvalidated device data.  Last Pain:  Vitals:   01/18/19 1234  TempSrc: Oral  PainSc: 0-No pain      Patients Stated Pain Goal: 2 ([id number])  Complications: No apparent anesthesia complications

## 2019-01-19 ENCOUNTER — Encounter (HOSPITAL_BASED_OUTPATIENT_CLINIC_OR_DEPARTMENT_OTHER): Payer: Self-pay | Admitting: Orthopedic Surgery

## 2019-01-19 NOTE — Addendum Note (Signed)
Addendum  created 01/19/19 0959 by Tawni Millers, CRNA   Charge Capture section accepted, Visit diagnoses modified

## 2019-01-31 DIAGNOSIS — S46011D Strain of muscle(s) and tendon(s) of the rotator cuff of right shoulder, subsequent encounter: Secondary | ICD-10-CM | POA: Diagnosis not present

## 2019-02-13 ENCOUNTER — Ambulatory Visit (INDEPENDENT_AMBULATORY_CARE_PROVIDER_SITE_OTHER): Payer: Federal, State, Local not specified - PPO | Admitting: Internal Medicine

## 2019-02-13 ENCOUNTER — Encounter: Payer: Self-pay | Admitting: Internal Medicine

## 2019-02-13 ENCOUNTER — Other Ambulatory Visit: Payer: Self-pay

## 2019-02-13 VITALS — BP 138/80 | HR 68 | Ht 70.0 in | Wt 251.0 lb

## 2019-02-13 DIAGNOSIS — E1159 Type 2 diabetes mellitus with other circulatory complications: Secondary | ICD-10-CM

## 2019-02-13 DIAGNOSIS — E669 Obesity, unspecified: Secondary | ICD-10-CM | POA: Diagnosis not present

## 2019-02-13 DIAGNOSIS — Z789 Other specified health status: Secondary | ICD-10-CM | POA: Insufficient documentation

## 2019-02-13 DIAGNOSIS — Z23 Encounter for immunization: Secondary | ICD-10-CM | POA: Diagnosis not present

## 2019-02-13 DIAGNOSIS — E785 Hyperlipidemia, unspecified: Secondary | ICD-10-CM | POA: Diagnosis not present

## 2019-02-13 LAB — POCT GLYCOSYLATED HEMOGLOBIN (HGB A1C): Hemoglobin A1C: 8.1 % — AB (ref 4.0–5.6)

## 2019-02-13 MED ORDER — LEVEMIR FLEXTOUCH 100 UNIT/ML ~~LOC~~ SOPN: PEN_INJECTOR | SUBCUTANEOUS | 3 refills | Status: DC

## 2019-02-13 NOTE — Addendum Note (Signed)
Addended by: Cardell Peach I on: 02/13/2019 03:45 PM   Modules accepted: Orders

## 2019-02-13 NOTE — Patient Instructions (Addendum)
Please continue: - Metformin 1000 mg 2x a day - Glipizide XL 5 mg 2x a day - break/crush the evening dose - Levemir 20 units at bedtime  Please start Ozempic 0.25 mg weekly in a.m. (for example on Sunday morning) x 4 weeks, then increase to 0.5 mg weekly in a.m. if no nausea or hypoglycemia.  Please come back for a follow-up appointment in 3 months. 

## 2019-02-13 NOTE — Progress Notes (Signed)
Patient ID: [name], male   DOB: 1951/01/13, 68 y.o.   MRN: [id number]  HPI: KENDALE Brock is a 68 y.o.-year-old male, returning for f/u for DM2, dx 2007, prev. insulin-dependent since 2014, uncontrolled, with complications (CAD - s/p NSTEMI 11/2014 - stent; ED). Last visit 4 months ago. PCP: Dr Odette Fraction.  Before last visit, he had radiotherapy for his prostate cancer (with gold beads).    At last visit, sugars are higher as he relaxed his diet.  He was going to bed late and eating at night.  He also gained weight.  Since last visit, he had shoulder surgery 01/18/2019 after rupture of his supraspinatus tendon.  He is very inactive, sleeps for 5 hours in the afternoon and then he is up the entire night and eats throughout the night.  Last hemoglobin A1c was: Lab Results  Component Value Date   HGBA1C 7.7 (A) 10/05/2018   HGBA1C 7.0 (A) 02/06/2018   HGBA1C 6.6 (A) 10/04/2017   Pt is on a regimen of: - Metformin 1000 mg 2x a day - Glipizide XL 5 mg 2x a day - break/crush the evening dose >> forgets many doses - Levemir 20 units at bedtime He got Ozempic 0.5 mg weekly >> recommended 10/2018 >> he got this but did not start...  He tried Toujeo 20 units at night but did not like it He did not start Januvia as suggested at last visit ("I don't think I need it"). He was Levemir 140 units in am >> decreased to 70 units>> stopped as this was too expensive for him He stopped Welchol 3.7g at bedtime a day. Stopped Farxiga 10 mg b/c frequent urination and urgency. He was on Glipizide in the past. He was on Metformin in the past >> no N/V.   Pt checks his sugars once a day: - am: 106-130 >> 105, 147-157 >> 150-180 (eating during the night) >> 150 - 2h after b'fast: n/c >> 128, 134 >> n/c - before lunch:   206 >> n/c >> 140s >> n/c - 2h after lunch: n/c >> 140-157 >> n/c - before dinner:  106-120 >> n/c >> 80-130 >> n/c - 2h after dinner: 200-245 >> 150-180, 200 >> n/c - bedtime:   151 >> 127-180, 200 >> n/c >> 180-200 >> 180-200 >> 180-200 - nighttime:94, 190-305 >> 148-216 >> n/c  Lowest sugar was 80 >> 105 >> 140 >> 130; he has hypoglycemia awareness in the 80s. Highest sugar was 220 >> 250 >> 200.  -+ Mild CKD, last BUN/creatinine:  Lab Results  Component Value Date   BUN 17 01/15/2019   CREATININE 0.94 01/15/2019  09/07/2016: 13/0.9, glucose 133 On losartan. -+ HL; last set of lipids: Lab Results  Component Value Date   CHOL 111 09/25/2018   HDL 44 09/25/2018   LDLCALC 44 09/25/2018   LDLDIRECT 117 (H) 01/17/2018   TRIG 117 09/25/2018   CHOLHDL 2.5 09/25/2018  09/07/2016: 167/65/33/121 She was on pravastatin but could not tolerate it due to muscle aches.  He is now on Repatha. - last eye exam was in 2020: No DR Dr. Lady Gary. -He denies numbness and tingling in his feet.  Pt was admitted for CP in 11/2014: NSTEMI >> CAD >> PTCA >> stent placed.  He was in ED with CP 05/2017 >> no cardiac pathology.  ROS: Constitutional: no weight gain/no weight loss, no fatigue, no subjective hyperthermia, no subjective hypothermia Eyes: no blurry vision, no xerophthalmia ENT: no sore throat, no  nodules palpated in neck, no dysphagia, no odynophagia, no hoarseness Cardiovascular: no CP/no SOB/no palpitations/no leg swelling Respiratory: no cough/no SOB/no wheezing Gastrointestinal: no N/no V/no D/no C/no acid reflux Musculoskeletal: no muscle aches/+ joint aches Skin: no rashes, no hair loss Neurological: no tremors/no numbness/no tingling/no dizziness  I reviewed pt's medications, allergies, PMH, social hx, family hx, and changes were documented in the history of present illness. Otherwise, unchanged from my initial visit note.  Past Medical History:  Diagnosis Date  . Adenomatous colon polyp 2011  . Anemia   . Angina pectoris ([place]) 05/01/2015   med rx for 95% OM (u/a to access due to tortuosity), 95% D1 and other moderate CAD at cath  . Arthritis     "back, knees" (05/01/2015)  . Chest pain    pleuritic  . Chronic lower back pain   . Complex tear of medial meniscus of left knee 09/14/2018  . Coronary artery disease   . Depression   . GERD (gastroesophageal reflux disease)   . HTN (hypertension)   . Hyperlipidemia   . Obstructive sleep apnea    noncompliant with CPAP  . Osteoarthritis   . Pneumonia ~ 2014 X 1  . Prostate cancer ([place])   . Refusal of blood transfusions as patient is Jehovah's Witness   . Rupture of right supraspinatus tendon 01/18/2019  . Type II or unspecified type diabetes mellitus without mention of complication, not stated as uncontrolled    Past Surgical History:  Procedure Laterality Date  . CARDIAC CATHETERIZATION  2004   patent coronary arteries  . CARDIAC CATHETERIZATION  05/01/2015   "tried to put stent in but couldn't"  . CARDIAC CATHETERIZATION N/A 05/01/2015   Procedure: Left Heart Cath and Coronary Angiography;  Surgeon: Belva Crome, MD; LAD 60%, oD1 90%, pD1 70%, D2 70%, CFX patent stent, 30% distal to prev stent, pRCA 20%, OM1 90/95%, NL LV  . CARDIAC CATHETERIZATION N/A 05/01/2015   Procedure: Coronary Stent Intervention;  Surgeon: Belva Crome, MD;  Unsuccessful PCI OM due to tortuosity  . CARDIAC CATHETERIZATION N/A 11/06/2014   Procedure: Left Heart Cath and Coronary Angiography;  Surgeon: Belva Crome, MD;  Location: [facility];  Service: Cardiovascular;  Laterality: N/A;  . CLOSED REDUCTION SHOULDER DISLOCATION Right ~ 1975   "& reattached muscle"  . COLONOSCOPY W/ BIOPSIES AND POLYPECTOMY  X 2  . ESOPHAGOGASTRODUODENOSCOPY ENDOSCOPY    . KNEE ARTHROSCOPY WITH MEDIAL MENISECTOMY Left 09/14/2018   Procedure: LEFT KNEE ARTHROSCOPY CHONDROPLASY,  WITH MEDIAL MENISECTOMY;  Surgeon: Marchia Bond, MD;  Location: [facility];  Service: Orthopedics;  Laterality: Left;  . PILONIDAL CYST EXCISION    . SHOULDER ARTHROSCOPY WITH DEBRIDEMENT AND BICEP TENDON REPAIR Right 01/18/2019    Procedure: RIGHT SHOULDER ARTHROSCOPY WITH DEBRIDEMENT AND DECOMPRSSION SUBACROMIAL PARTIAL ACROMIOPLASTY;  Surgeon: Marchia Bond, MD;  Location: [facility];  Service: Orthopedics;  Laterality: Right;  . SHOULDER ARTHROSCOPY WITH ROTATOR CUFF REPAIR Right 01/18/2019   Procedure: SHOULDER ARTHROSCOPY WITH ROTATOR CUFF REPAIR;  Surgeon: Marchia Bond, MD;  Location: [facility];  Service: Orthopedics;  Laterality: Right;   Social History   Socioeconomic History  . Marital status: Married    Spouse name: [name]  . Number of children: 4  . Years of education: Not on file  . Highest education level: Not on file  Occupational History  . Occupation: Retired    Fish farm manager: UNEMPLOYED  Social Needs  . Financial resource strain: Not on  file  . Food insecurity    Worry: Not on file    Inability: Not on file  . Transportation needs    Medical: No    Non-medical: No  Tobacco Use  . Smoking status: Former Smoker    Packs/day: 0.50    Years: 10.00    Pack years: 5.00    Types: Cigarettes    Quit date: 04/06/1983    Years since quitting: 35.8  . Smokeless tobacco: Never Used  Substance and Sexual Activity  . Alcohol use: Yes    Alcohol/week: 0.0 standard drinks    Comment: `/26/2017 "might drink a beer q couple months mostly; summertime I might drink 2-3 beers/week"  . Drug use: No  . Sexual activity: Not Currently  Lifestyle  . Physical activity    Days per week: Not on file    Minutes per session: Not on file  . Stress: Not on file  Relationships  . Social Herbalist on phone: Not on file    Gets together: Not on file    Attends religious service: Not on file    Active member of club or organization: Not on file    Attends meetings of clubs or organizations: Not on file    Relationship status: Not on file  . Intimate partner violence    Fear of current or ex partner: No    Emotionally abused: No    Physically abused: No    Forced  sexual activity: No  Other Topics Concern  . Not on file  Social History Narrative   Lives in [place] with spouse Munachimso Romack).  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   Current Outpatient Medications on File Prior to Visit  Medication Sig Dispense Refill  . baclofen (LIORESAL) 10 MG tablet Take 1 tablet (10 mg total) by mouth 3 (three) times daily. As needed for muscle spasm 50 tablet 0  . Blood Glucose Monitoring Suppl (RELION PREMIER BLU MONITOR) DEVI 1 Device by Does not apply route daily. 1 Device 0  . Evolocumab (REPATHA SURECLICK) [id number] MG/ML SOAJ Inject 1 pen into the skin every 14 (fourteen) days. 2 pen 11  . glipiZIDE (GLUCOTROL XL) 5 MG 24 hr tablet TAKE 1 TABLET BY MOUTH TWICE A DAY 180 tablet 1  . glucose blood test strip Use as instructed to check sugar 2 times daily 200 each 5  . hydrochlorothiazide (HYDRODIURIL) 25 MG tablet Take 1 tablet (25 mg total) by mouth daily. 90 tablet 3  . Insulin Pen Needle (NOVOFINE PLUS) 32G X 4 MM MISC Use 1x a day 100 each 11  . LEVEMIR FLEXTOUCH 100 UNIT/ML Pen INJECT 16 UNITS INTO THE SKIN DAILY AT 10 PM. 15 mL 3  . losartan (COZAAR) 100 MG tablet Take 1 tablet (100 mg total) by mouth daily. 90 tablet 3  . metFORMIN (GLUCOPHAGE) 500 MG tablet TAKE 2 TABLETS (1,000 MG TOTAL) BY MOUTH 2 (TWO) TIMES DAILY WITH A MEAL. 360 tablet 0  . metoprolol tartrate (LOPRESSOR) 50 MG tablet Take 1 tablet (50 mg total) by mouth 2 (two) times daily. 180 tablet 3  . nitroGLYCERIN (NITROSTAT) 0.4 MG SL tablet PLACE ONE TABLET UNDER THE TONGUE EVERY 5 MINUTES X3 DOSES AS NEEDED FOR CHEST PAIN 75 tablet 1  . ondansetron (ZOFRAN) 4 MG tablet Take 1 tablet (4 mg total) by mouth every 8 (eight) hours as needed for nausea or vomiting. 10  tablet 0  . oxyCODONE (ROXICODONE) 5 MG immediate release tablet Take 1 tablet (5 mg total) by mouth every 4 (four) hours as needed for severe pain. 30 tablet 0  .  sennosides-docusate sodium (SENOKOT-S) 8.6-50 MG tablet Take 2 tablets by mouth daily. 30 tablet 1   No current facility-administered medications on file prior to visit.    Allergies  Allergen Reactions  . Statins Other (See Comments)    REACTION: joint pain Lipitor- headaches Has also tried Livalo, pravachol, zetia, crestor, welchol  . Imdur [Isosorbide Dinitrate]     Headache - "violent"   Family History  Problem Relation Age of Onset  . Coronary artery disease Other        CABG  . Coronary artery disease Mother   . Stroke Mother   . Heart disease Mother   . Diabetes Mother   . Other Father   . Diabetes Sister   . Diabetes Maternal Uncle        x2  . Alcohol abuse Other   . Diabetes Paternal Uncle        x2  . Colon cancer Paternal Uncle   . Colon polyps Neg Hx   . Esophageal cancer Neg Hx     PE: BP 138/80   Pulse 68   Ht 5\' 10"  (1.778 m)   Wt 251 lb (113.9 kg)   SpO2 98%   BMI 36.01 kg/m   Wt Readings from Last 3 Encounters:  02/13/19 251 lb (113.9 kg)  01/18/19 252 lb 13.9 oz (114.7 kg)  10/05/18 254 lb (115.2 kg)   Constitutional: overweight, in NAD Eyes: PERRLA, EOMI, no exophthalmos ENT: moist mucous membranes, no thyromegaly, no cervical lymphadenopathy Cardiovascular: RRR, No MRG Respiratory: CTA B Gastrointestinal: abdomen soft, NT, ND, BS+ Musculoskeletal: no deformities, strength intact in all 4 Skin: moist, warm, no rashes Neurological: no tremor with outstretched hands, DTR normal in all 4  ASSESSMENT: 1. DM2, insulin-dependent, uncontrolled, with complications - CAD, s/p NSTEMI, s/p stent 11/2014 - ED  2. Obesity class II BMI Classification:  < 18.5 underweight   18.5-24.9 normal weight   25.0-29.9 overweight   30.0-34.9 class I obesity   35.0-39.9 class II obesity   ? 40.0 class III obesity   3. HL  PLAN:  1. Patient was initially diabetes control in the last 3.5 years, after which it was, long-acting insulin, however,  this had to be added back as his sugar started to increase.  At last visit, they were even higher after relaxing his diet and eating at night.  We discussed about stopping his snacks at night and also to improve his diet.  I also suggested a weekly GLP-1 receptor agonist. -At this visit, he tells me that he obtained a GLP-1 receptor agonist from the pharmacy but did not start it.  He had said that, he can start but she wanted to see what his HbA1c would be today before doing so.  At this visit, his sugars are higher as he is inactive, he is sleepy during the day and is up during the night, and he snacks frequently.  Also, he is forgetting his glipizide.  I advised him to restart taking glipizide and also to start taking Ozempic at a low dose and increase as tolerated.  Also, we discussed about trying to regulate his circadian rhythm so that he goes to bed at night instead of during the day. - I suggested to:  Patient Instructions  Please continue: - Metformin 1000  mg 2x a day - Glipizide XL 5 mg 2x a day - break/crush the evening dose - Levemir 20 units at bedtime  Please start Ozempic 0.25 mg weekly in a.m. (for example on Sunday morning) x 4 weeks, then increase to 0.5 mg weekly in a.m. if no nausea or hypoglycemia.  Please come back for a follow-up appointment in 3-4 months.  - we checked his HbA1c: 8.1% (higher) - advised to check sugars at different times of the day - 1-2x a day, rotating check times - advised for yearly eye exams >> he is UTD - + Flu shot and pneumonia shot today - return to clinic in 3-4 months   2. Obesity -No weight loss since last visit -At last visit I advised him to stop eating at night and we also started Ozempic, which should also help with weight loss. He did not start yet >> will start now.  We also discussed about improving diet and a sleep pattern.  3. HL -Reviewed latest lipid panel from earlier this summer: All fractions at goal, much improved LDL: Lab  Results  Component Value Date   CHOL 111 09/25/2018   HDL 44 09/25/2018   LDLCALC 44 09/25/2018   LDLDIRECT 117 (H) 01/17/2018   TRIG 117 09/25/2018   CHOLHDL 2.5 09/25/2018  -Continues Repatha without side effects - he developed stiffness and weakness from statins  [name], MD PhD [facility]

## 2019-02-23 ENCOUNTER — Telehealth: Payer: Self-pay | Admitting: *Deleted

## 2019-02-23 NOTE — Telephone Encounter (Signed)
Patient has left several messages about his sleep study done on 02/06/2018. He was informed of his results (02/21/2018) that states he had 58.1/hour and was recommended to have a  BiPAP titration in lab.  The Bipap titration was sent to pre cert on 71/24/5809. On 03/15/2018 the patient was contacted with a date for his BiPAP titration but the patient stated "he already has a Bipap machine that he got from advanced from a previous study a while ago. Patient does not wish to have the Bipap Titration done. He states he has a bill with your name on it and he has never met you before. His question was why am I getting a bill for a doctor I have never seen? I explained to him that you were the doctor that read his study and will be the doctor that follows his sleep therapy . The patient has declined to test and wants to forget everything going forward." At that point I notified the doctor.  On 03/18/2018 patient sent another my chat message stating "I am confused as to why I did not have a sleep study based on the information I gave you at the start. that I have a sleep machine at home I planned to continue using. no one thought to investigate, or ask about previous sleep study results.its evident dr turners work is at the end of the [employer]. I have a bipap machine all I needed was to continue and be reintroduced to it not start over to the tune of 3,000 dollars. and 400.00 for reading test results we can do nothing with.i hate wasting money and time this should be over. whoever relays patient info because I have never talked to her dropped the ball I guess. I fine I must agree with her reviews on internet. I was basing a study to be what was displayed in my chart. now it appears the term split study can clear this up. I will see if [employer] can clear this up for me and at what total cost.we will continue afterward tell doctor also." A year later 02/12/2019 the patient's my chart messages start again with "I was refused  surgery in a [facility] facility because I was not compliant with my cpap usage. who would have figured? I received a test 02/06/2018. 02/08/2018 Traci turners receptionist called and said I needed another test they gave me the wrong one. I have tried several times this year trying to find out from t turner ,study lab,[facility] , why I was not given what was ordered. a bipap triation. and who would pay. no results. they withdrew the order off my chart completely as I was trying to print it. If I could talk to someone like dr turner to see clearer it would help. if not I will let someone else handle it."  Dr Radford Pax was very confused about what was going on and said "He will need to be set up with me to discuss this as this is very confusing and I'm not sure how he got a BiPAP when he never had a titration.   I made the patient a virtual appointment with Dr Radford Pax for 04/17/2019 but after going back and forth saying he did want the appointment and then he does not want the appointment he finally said just cancel the appointment and he will have his attorney to call Dr Radford Pax. I went to Dr Radford Pax and told her what he said and she called him on his cell phone  and explained everything to him about how and why the process is done the way it is done.  The patient said he had gotten clarity and he better understands everything now. She told him he needed the BiPAP titration to adequately treat his sleep apnea and that I would send it to be pre certed and once the insurance approves the study I will call him to set it up and give him the details. The patient was agreeable to the treatment and thanked her for calling.

## 2019-02-23 NOTE — Telephone Encounter (Signed)

## 2019-02-24 NOTE — Telephone Encounter (Signed)
I called patient 02/23/2019 and had a lengthy conversation with him for 45 minutes.  He was very confused about how the entire process works in regards to ordering a sleep study.  I have reviewed his chart at length and below are the events that occurred in detal:  02/22/2017 - patient seen by Truitt Merle, NP,  He had a hx of OSA in the past and had been on CPAP but was not using it and asked to get placed back on PAP therapy.  Truitt Merle, NP ordered a split night study.    02/24/2019 - order sent for precert for split night sleep study  04/15/2017 - split night sleep study approved   05/06/17- sleep study date - patient cancelled  05/16/2017 - patient called stating he had approval from secondary insurance for sleep study  05/24/2017 - Sydell Axon CMA notified patient that patients message sent to pre-cert dept  7/37/10 - sleep study cancelled  05/24/17 - sleep study approved by secondary insurance and study set up for 3/15  06/17/17 - patient no showed for appt and called to reschedule before precert ran out  09/28/92 - Patient cancelled sleep study and called and told us he would call back when he was ready to rechedule.  01/2018 - Seen back by Daune Perch, NP who reordered sleep study  01/2018 - sleep study ordered fro 02/06/2018  02/06/2018 - sleep study performed showing severe OSA and failed CPAP titration.  BiPAP titration recommended.  02/07/2018 - attempted to notify patient of sleep results.  Messages left on phone 11/6, 11/8, 11/11.  02/24/2018 -  patient notified by sleep nurse, Sydell Axon, CMA, that patient has severe OSA and could not be adequately titrated on CPAP and BIPAP titration was recommended.  Patient verbalized to her that he understood the results and agreed to proceed with BiPAP  03/15/2018 - patient called stating he got a bill with my name on it for the sleep study reading and was very mad that he had to pay an MD for a sleep study when he had never  met the physician.  He stated that he already had a BiPAP device from a prior study a long time ago and wanted to go back on that device.  He refused any further testing and did not want to proceed with anything.  I never received this notification in Epic and therefore was unaware that the patient had refused further therapy.   The patient called back to the office stating that he could not have surgery done because he was not on CPAP.    During my conversation today with patient, he told me that is was convinced that the wrong sleep study was ordered on him and he should have just had a BiPAP titration and not a CPAP titration.  I explained to him at length that since he had not been on CPAP for a long while we had to start over with a sleep study to determine the degree of OSA he had.  I explained that the split night sleep study that Truitt Merle, NP ordered was the correct study.  Unfortunately he has severe OSA and the CPAP that was started during the study could not adequately treat his OSA.  I also explained to him that there was no enough time during the study to switch to BiPAP and get him titrated and that is why another study needed to be done using only BiPAP given the severity of his apnea.  I recommended that he proceed with the BiPAP titration as he has very severe OSA.  Once this is completed he will be set up to see me in the office for an OV to review how he is doing.  At the end of the conversation he seemed to understand the process for getting back on PAP therapy and that the initial study he had done WAS in [facility] the correct study.  He is agreeable to proceeding with the BIPAP titration.    In reviewing the chart, the main issue at hand was that the message from my chart that that patient sent in stating he already had a BiPAP machine and didn't want a titration and wanted to just be put on a setting, was never forwarded to me and therefore I was never made aware that the patient had  questions. I apologized for the oversight.  The patient seemed satisfied at the end of the conversation.

## 2019-02-25 NOTE — Telephone Encounter (Signed)
Thanks Hank

## 2019-02-25 NOTE — Telephone Encounter (Signed)
This is very revealing. We go beyond and still have unfounded complaints. Not sure if we can solve this. Thanks for spending the time and hopefully it will make a difference for this patient.

## 2019-02-26 ENCOUNTER — Telehealth: Payer: Self-pay | Admitting: *Deleted

## 2019-02-26 DIAGNOSIS — G4733 Obstructive sleep apnea (adult) (pediatric): Secondary | ICD-10-CM

## 2019-02-26 NOTE — Telephone Encounter (Signed)
Patient received a call on Friday 02/16/19 from Dr Radford Pax and myself. Dr Radford Pax answered all patients questions to his satisfaction and patient agreed to have the Bipapa titration. Pt was agreeable to treatment.

## 2019-02-28 DIAGNOSIS — S46011D Strain of muscle(s) and tendon(s) of the rotator cuff of right shoulder, subsequent encounter: Secondary | ICD-10-CM | POA: Diagnosis not present

## 2019-03-15 ENCOUNTER — Other Ambulatory Visit: Payer: Self-pay

## 2019-03-15 ENCOUNTER — Encounter: Payer: Self-pay | Admitting: Family Medicine

## 2019-03-15 ENCOUNTER — Ambulatory Visit (INDEPENDENT_AMBULATORY_CARE_PROVIDER_SITE_OTHER): Payer: Federal, State, Local not specified - PPO | Admitting: Family Medicine

## 2019-03-15 VITALS — BP 184/84 | HR 56 | Temp 98.1°F | Resp 12 | Ht 70.0 in | Wt 254.0 lb

## 2019-03-15 DIAGNOSIS — I10 Essential (primary) hypertension: Secondary | ICD-10-CM

## 2019-03-15 DIAGNOSIS — R0982 Postnasal drip: Secondary | ICD-10-CM

## 2019-03-15 DIAGNOSIS — R05 Cough: Secondary | ICD-10-CM | POA: Diagnosis not present

## 2019-03-15 DIAGNOSIS — R059 Cough, unspecified: Secondary | ICD-10-CM

## 2019-03-15 MED ORDER — ALBUTEROL SULFATE HFA 108 (90 BASE) MCG/ACT IN AERS: 1.0000 | INHALATION_SPRAY | RESPIRATORY_TRACT | 0 refills | Status: DC | PRN

## 2019-03-15 MED ORDER — FLUTICASONE PROPIONATE 50 MCG/ACT NA SUSP: 1.0000 | Freq: Every day | NASAL | 6 refills | Status: DC

## 2019-03-15 NOTE — Patient Instructions (Addendum)
Over the counter cold medications and increased sodium/salt in the diet is likely to blame for the elevated blood pressure.  Cut back on salt intake, stop the over-the-counter cold medicine.  Start Flonase nasal spray 1 to 2 sprays in each nostril every day.  Albuterol was prescribed but that should only be used for shortness of breath or wheezing/asthma symptoms.   Keep follow-up with cardiology as planned in 1 week.  If any new symptoms during that time be seen here or the emergency room if acutely worsening.  Return to the clinic or go to the nearest emergency room if any of your symptoms worsen or new symptoms occur.    Postnasal Drip Postnasal drip is the feeling of mucus going down the back of your throat. Mucus is a slimy substance that moistens and cleans your nose and throat, as well as the air pockets in face bones near your forehead and cheeks (sinuses). Small amounts of mucus pass from your nose and sinuses down the back of your throat all the time. This is normal. When you produce too much mucus or the mucus gets too thick, you can feel it. Some common causes of postnasal drip include:  Having more mucus because of: ? A cold or the flu. ? Allergies. ? Cold air. ? Certain medicines.  Having more mucus that is thicker because of: ? A sinus or nasal infection. ? Dry air. ? A food allergy. Follow these instructions at home: Relieving discomfort   Gargle with a salt-water mixture 3-4 times a day or as needed. To make a salt-water mixture, completely dissolve -1 tsp of salt in 1 cup of warm water.  If the air in your home is dry, use a humidifier to add moisture to the air.  Use a saline spray or container (neti pot) to flush out the nose (nasal irrigation). These methods can help clear away mucus and keep the nasal passages moist. General instructions  Take over-the-counter and prescription medicines only as told by your health care provider.  Follow instructions from your  health care provider about eating or drinking restrictions. You may need to avoid caffeine.  Avoid things that you know you are allergic to (allergens), like dust, mold, pollen, pets, or certain foods.  Drink enough fluid to keep your urine pale yellow.  Keep all follow-up visits as told by your health care provider. This is important. Contact a health care provider if:  You have a fever.  You have a sore throat.  You have difficulty swallowing.  You have headache.  You have sinus pain.  You have a cough that does not go away.  The mucus from your nose becomes thick and is green or yellow in color.  You have cold or flu symptoms that last more than 10 days. Summary  Postnasal drip is the feeling of mucus going down the back of your throat.  If your health care provider approves, use nasal irrigation or a nasal spray 2?4 times a day.  Avoid things that you know you are allergic to (allergens), like dust, mold, pollen, pets, or certain foods. This information is not intended to replace advice given to you by your health care provider. Make sure you discuss any questions you have with your health care provider. Document Released: 07/05/2016 Document Revised: 07/14/2018 Document Reviewed: 07/05/2016 Elsevier Patient Education  2020 [employer].  Hypertension, Adult High blood pressure (hypertension) is when the force of blood pumping through the arteries is too strong. The  arteries are the blood vessels that carry blood from the heart throughout the body. Hypertension forces the heart to work harder to pump blood and may cause arteries to become narrow or stiff. Untreated or uncontrolled hypertension can cause a heart attack, heart failure, a stroke, kidney disease, and other problems. A blood pressure reading consists of a higher number over a lower number. Ideally, your blood pressure should be below 120/80. The first ("top") number is called the systolic pressure. It is a  measure of the pressure in your arteries as your heart beats. The second ("bottom") number is called the diastolic pressure. It is a measure of the pressure in your arteries as the heart relaxes. What are the causes? The exact cause of this condition is not known. There are some conditions that result in or are related to high blood pressure. What increases the risk? Some risk factors for high blood pressure are under your control. The following factors may make you more likely to develop this condition:  Smoking.  Having type 2 diabetes mellitus, high cholesterol, or both.  Not getting enough exercise or physical activity.  Being overweight.  Having too much fat, sugar, calories, or salt (sodium) in your diet.  Drinking too much alcohol. Some risk factors for high blood pressure may be difficult or impossible to change. Some of these factors include:  Having chronic kidney disease.  Having a family history of high blood pressure.  Age. Risk increases with age.  Race. You may be at higher risk if you are African American.  Gender. Men are at higher risk than women before age 14. After age 17, women are at higher risk than men.  Having obstructive sleep apnea.  Stress. What are the signs or symptoms? High blood pressure may not cause symptoms. Very high blood pressure (hypertensive crisis) may cause:  Headache.  Anxiety.  Shortness of breath.  Nosebleed.  Nausea and vomiting.  Vision changes.  Severe chest pain.  Seizures. How is this diagnosed? This condition is diagnosed by measuring your blood pressure while you are seated, with your arm resting on a flat surface, your legs uncrossed, and your feet flat on the floor. The cuff of the blood pressure monitor will be placed directly against the skin of your upper arm at the level of your heart. It should be measured at least twice using the same arm. Certain conditions can cause a difference in blood pressure between  your right and left arms. Certain factors can cause blood pressure readings to be lower or higher than normal for a short period of time:  When your blood pressure is higher when you are in a health care provider's office than when you are at home, this is called white coat hypertension. Most people with this condition do not need medicines.  When your blood pressure is higher at home than when you are in a health care provider's office, this is called masked hypertension. Most people with this condition may need medicines to control blood pressure. If you have a high blood pressure reading during one visit or you have normal blood pressure with other risk factors, you may be asked to:  Return on a different day to have your blood pressure checked again.  Monitor your blood pressure at home for 1 week or longer. If you are diagnosed with hypertension, you may have other blood or imaging tests to help your health care provider understand your overall risk for other conditions. How is this treated?  This condition is treated by making healthy lifestyle changes, such as eating healthy foods, exercising more, and reducing your alcohol intake. Your health care provider may prescribe medicine if lifestyle changes are not enough to get your blood pressure under control, and if:  Your systolic blood pressure is above 130.  Your diastolic blood pressure is above 80. Your personal target blood pressure may vary depending on your medical conditions, your age, and other factors. Follow these instructions at home: Eating and drinking   Eat a diet that is high in fiber and potassium, and low in sodium, added sugar, and fat. An example eating plan is called the DASH (Dietary Approaches to Stop Hypertension) diet. To eat this way: ? Eat plenty of fresh fruits and vegetables. Try to fill one half of your plate at each meal with fruits and vegetables. ? Eat whole grains, such as whole-wheat pasta, brown rice,  or whole-grain bread. Fill about one fourth of your plate with whole grains. ? Eat or drink low-fat dairy products, such as skim milk or low-fat yogurt. ? Avoid fatty cuts of meat, processed or cured meats, and poultry with skin. Fill about one fourth of your plate with lean proteins, such as fish, chicken without skin, beans, eggs, or tofu. ? Avoid pre-made and processed foods. These tend to be higher in sodium, added sugar, and fat.  Reduce your daily sodium intake. Most people with hypertension should eat less than 1,500 mg of sodium a day.  Do not drink alcohol if: ? Your health care provider tells you not to drink. ? You are pregnant, may be pregnant, or are planning to become pregnant.  If you drink alcohol: ? Limit how much you use to:  0-1 drink a day for women.  0-2 drinks a day for men. ? Be aware of how much alcohol is in your drink. In the [place] one drink equals one 12 oz bottle of beer (355 mL), one 5 oz glass of wine (148 mL), or one 1 oz glass of hard liquor (44 mL). Lifestyle   Work with your health care provider to maintain a healthy body weight or to lose weight. Ask what an ideal weight is for you.  Get at least 30 minutes of exercise most days of the week. Activities may include walking, swimming, or biking.  Include exercise to strengthen your muscles (resistance exercise), such as Pilates or lifting weights, as part of your weekly exercise routine. Try to do these types of exercises for 30 minutes at least 3 days a week.  Do not use any products that contain nicotine or tobacco, such as cigarettes, e-cigarettes, and chewing tobacco. If you need help quitting, ask your health care provider.  Monitor your blood pressure at home as told by your health care provider.  Keep all follow-up visits as told by your health care provider. This is important. Medicines  Take over-the-counter and prescription medicines only as told by your health care provider. Follow  directions carefully. Blood pressure medicines must be taken as prescribed.  Do not skip doses of blood pressure medicine. Doing this puts you at risk for problems and can make the medicine less effective.  Ask your health care provider about side effects or reactions to medicines that you should watch for. Contact a health care provider if you:  Think you are having a reaction to a medicine you are taking.  Have headaches that keep coming back (recurring).  Feel dizzy.  Have swelling in your ankles.  Have trouble with your vision. Get help right away if you:  Develop a severe headache or confusion.  Have unusual weakness or numbness.  Feel faint.  Have severe pain in your chest or abdomen.  Vomit repeatedly.  Have trouble breathing. Summary  Hypertension is when the force of blood pumping through your arteries is too strong. If this condition is not controlled, it may put you at risk for serious complications.  Your personal target blood pressure may vary depending on your medical conditions, your age, and other factors. For most people, a normal blood pressure is less than 120/80.  Hypertension is treated with lifestyle changes, medicines, or a combination of both. Lifestyle changes include losing weight, eating a healthy, low-sodium diet, exercising more, and limiting alcohol. This information is not intended to replace advice given to you by your health care provider. Make sure you discuss any questions you have with your health care provider. Document Released: 03/22/2005 Document Revised: 11/30/2017 Document Reviewed: 11/30/2017 Elsevier Patient Education  [employer].   If you have lab work done today you will be contacted with your lab results within the next 2 weeks.  If you have not heard from [place] then please contact us. The fastest way to get your results is to register for My Chart.   IF you received an x-ray today, you will receive an invoice from  [facility]. Please contact [facility] at [phone] with questions or concerns regarding your invoice.   IF you received labwork today, you will receive an invoice from [place]. Please contact [facility] at [phone] with questions or concerns regarding your invoice.   Our billing staff will not be able to assist you with questions regarding bills from these companies.  You will be contacted with the lab results as soon as they are available. The fastest way to get your results is to activate your My Chart account. Instructions are located on the last page of this paperwork. If you have not heard from [place] regarding the results in 2 weeks, please contact this office.

## 2019-03-15 NOTE — Progress Notes (Signed)
Subjective:  Patient ID: [name], male    DOB: 1950/06/30  Age: 68 y.o. MRN: [id number]  CC:  Chief Complaint  Patient presents with   Hypertension    Pt stated --headache, drinking with sodium make the B/p elevated--long term. PHQ=13, GAD7=2   Establish Care   HPI [name] presents for    New patient to me here to establish care with concerns of high blood pressure.  Multiple medical problems including CAD, insulin-dependent diabetes, paroxysmal atrial tachycardia, obstructive sleep apnea, BPH, hyperlipidemia, GERD per problem list.  Prior PCP Dr. Osborne Casco last year.  Endocrinology, Dr. Cruzita Lederer.  Primary concern today of his blood pressure.  Hypertension: Cardiology: Dr. [name] - has appt with Daune Perch in 1 week.  Not using CPAP machine. Plans on bipap titration - coordinating with cardiology.  Possible increased sodium in low calorie drink prior - not taking now.  HCTZ 25mg  QD, losartan 100mg  qd, metoprolol 50mg  BID. No missed doses.  No current CP/shortness of breath/headache/weakness.  Home readings: 180/80.  No alcohol.  Possibly eating more salty foods this past week.  Also taking otc cold medicine -robitussin for congestion recently.   Used albuterol in past, ran out 6 weeks ago. Used about once per week or two. No recent wheeze/dyspnea. Cough only with sinus drainage and PND.  Steroid nasal spray - flonase? About once per week. helps drainage. Another otc spray in past caused burning - not recent.    Depression: positive screening.  No suicidal thoughts. Momentary symptoms. Not daily. No current meds - on meds in past for depression about 30 yrs ago.  Denies those symptoms now.  Depression screen [facility] 2/9 03/15/2019 12/30/2014  Decreased Interest 3 0  Down, Depressed, Hopeless 1 0  PHQ - 2 Score 4 0  Altered sleeping 3 -  Tired, decreased energy 3 -  Change in appetite 3 -  Feeling bad or failure about yourself  0 -  Trouble concentrating  0 -  Moving slowly or fidgety/restless 0 -  Suicidal thoughts 0 -  PHQ-9 Score 13 -  Difficult doing work/chores Not difficult at all -  Some recent data might be hidden   GAD 7 : Generalized Anxiety Score 03/15/2019  Nervous, Anxious, on Edge 0  Control/stop worrying 1  Worry too much - different things 1  Trouble relaxing 0  Restless 0  Easily annoyed or irritable 0  Afraid - awful might happen 0  Total GAD 7 Score 2  Anxiety Difficulty Not difficult at all     BP Readings from Last 3 Encounters:  03/15/19 (!) 184/84  02/13/19 138/80  01/18/19 (!) 172/81   Lab Results  Component Value Date   CREATININE 0.94 01/15/2019         History Patient Active Problem List   Diagnosis Date Noted   Statin intolerance -weakness, stiffness 02/13/2019   Rupture of right supraspinatus tendon 01/18/2019   Complex tear of medial meniscus of left knee 09/14/2018   Malignant neoplasm of prostate ([place]) 01/04/2018   Refusal of blood transfusions as patient is Jehovah's Witness    Pneumonia    Osteoarthritis    Obstructive sleep apnea    Hyperlipidemia    HTN (hypertension)    GERD (gastroesophageal reflux disease)    Depression    Coronary artery disease    Chronic lower back pain    Chest pain    Arthritis    Anemia    Benign essential HTN 01/03/2017  PAT (paroxysmal atrial tachycardia) ([place]) 10/27/2015   Type 2 diabetes mellitus with circulatory disorder, without long-term current use of insulin ([place]) 06/19/2015   Angina pectoris ([facility])    Elevated PSA 12/20/2014   CAD (coronary artery disease)    Obstructive sleep apnea 09/06/2014   Personal history of colonic polyps 12/13/2013   Blood in stool 12/13/2013   Prostatitis, acute 12/13/2013   Insomnia 04/19/2013   Obesity with body mass index of 30.0-39.9 10/18/2012   Right lumbar radiculitis 09/29/2011   Adenomatous colon polyp 04/05/2009   ORGANIC IMPOTENCE 11/11/2008   Hyperlipidemia  LDL goal <70 08/23/2008   GERD 08/23/2008   BPH (benign prostatic hypertrophy) with urinary obstruction 08/23/2008   OSTEOARTHRITIS 08/23/2008   Past Medical History:  Diagnosis Date   Adenomatous colon polyp 2011   Anemia    Angina pectoris ([place]) 05/01/2015   med rx for 95% OM (u/a to access due to tortuosity), 95% D1 and other moderate CAD at cath   Arthritis    "back, knees" (05/01/2015)   Chest pain    pleuritic   Chronic lower back pain    Complex tear of medial meniscus of left knee 09/14/2018   Coronary artery disease    Depression    GERD (gastroesophageal reflux disease)    HTN (hypertension)    Hyperlipidemia    Obstructive sleep apnea    noncompliant with CPAP   Osteoarthritis    Pneumonia ~ 2014 X 1   Prostate cancer ([place])    Refusal of blood transfusions as patient is Jehovah's Witness    Rotator cuff arthropathy, right    Rupture of right supraspinatus tendon 01/18/2019   Type II or unspecified type diabetes mellitus without mention of complication, not stated as uncontrolled    Past Surgical History:  Procedure Laterality Date   CARDIAC CATHETERIZATION  2004   patent coronary arteries   CARDIAC CATHETERIZATION  05/01/2015   "tried to put stent in but couldn't"   CARDIAC CATHETERIZATION N/A 05/01/2015   Procedure: Left Heart Cath and Coronary Angiography;  Surgeon: Belva Crome, MD; LAD 60%, oD1 90%, pD1 70%, D2 70%, CFX patent stent, 30% distal to prev stent, pRCA 20%, OM1 90/95%, NL LV   CARDIAC CATHETERIZATION N/A 05/01/2015   Procedure: Coronary Stent Intervention;  Surgeon: Belva Crome, MD;  Unsuccessful PCI OM due to tortuosity   CARDIAC CATHETERIZATION N/A 11/06/2014   Procedure: Left Heart Cath and Coronary Angiography;  Surgeon: Belva Crome, MD;  Location: [place] [facility];  Service: Cardiovascular;  Laterality: N/A;   CLOSED REDUCTION SHOULDER DISLOCATION Right ~ 1975   "& reattached muscle"   COLONOSCOPY W/ BIOPSIES  AND POLYPECTOMY  X 2   ESOPHAGOGASTRODUODENOSCOPY ENDOSCOPY     KNEE ARTHROSCOPY WITH MEDIAL MENISECTOMY Left 09/14/2018   Procedure: LEFT KNEE ARTHROSCOPY CHONDROPLASY,  WITH MEDIAL MENISECTOMY;  Surgeon: Marchia Bond, MD;  Location: [place];  Service: Orthopedics;  Laterality: Left;   PILONIDAL CYST EXCISION     SHOULDER ARTHROSCOPY WITH DEBRIDEMENT AND BICEP TENDON REPAIR Right 01/18/2019   Procedure: RIGHT SHOULDER ARTHROSCOPY WITH DEBRIDEMENT AND DECOMPRSSION SUBACROMIAL PARTIAL ACROMIOPLASTY;  Surgeon: Marchia Bond, MD;  Location: [place];  Service: Orthopedics;  Laterality: Right;   SHOULDER ARTHROSCOPY WITH ROTATOR CUFF REPAIR Right 01/18/2019   Procedure: SHOULDER ARTHROSCOPY WITH ROTATOR CUFF REPAIR;  Surgeon: Marchia Bond, MD;  Location: [place];  Service: Orthopedics;  Laterality: Right;   Allergies  Allergen Reactions   Statins  Other (See Comments)    REACTION: joint pain Lipitor- headaches Has also tried Livalo, pravachol, zetia, crestor, welchol   Imdur [Isosorbide Dinitrate]     Headache - "violent"   Prior to Admission medications   Medication Sig Start Date End Date Taking? Authorizing Provider  Blood Glucose Monitoring Suppl (RELION PREMIER BLU MONITOR) DEVI 1 Device by Does not apply route daily. 07/12/16  Yes [name], MD  Evolocumab (REPATHA SURECLICK) [id number] MG/ML SOAJ Inject 1 pen into the skin every 14 (fourteen) days. 06/29/18  Yes Belva Crome, MD  glipiZIDE (GLUCOTROL XL) 5 MG 24 hr tablet TAKE 1 TABLET BY MOUTH TWICE A DAY 07/17/18  Yes [name], MD  glucose blood test strip Use as instructed to check sugar 2 times daily 07/12/16  Yes [name], MD  hydrochlorothiazide (HYDRODIURIL) 25 MG tablet Take 1 tablet (25 mg total) by mouth daily. 05/05/18  Yes Belva Crome, MD  Insulin Detemir (LEVEMIR FLEXTOUCH) 100 UNIT/ML Pen INJECT 20 UNITS INTO THE SKIN DAILY AT 10 PM. 02/13/19   Yes [name], MD  Insulin Pen Needle (NOVOFINE PLUS) 32G X 4 MM MISC Use 1x a day 05/09/14  Yes [name], MD  losartan (COZAAR) 100 MG tablet Take 1 tablet (100 mg total) by mouth daily. 05/05/18  Yes Belva Crome, MD  metFORMIN (GLUCOPHAGE) 500 MG tablet TAKE 2 TABLETS (1,000 MG TOTAL) BY MOUTH 2 (TWO) TIMES DAILY WITH A MEAL. 10/17/18  Yes [name], MD  metoprolol tartrate (LOPRESSOR) 50 MG tablet Take 1 tablet (50 mg total) by mouth 2 (two) times daily. 04/18/18  Yes Belva Crome, MD  nitroGLYCERIN (NITROSTAT) 0.4 MG SL tablet PLACE ONE TABLET UNDER THE TONGUE EVERY 5 MINUTES X3 DOSES AS NEEDED FOR CHEST PAIN 09/27/18  Yes Belva Crome, MD   Social History   Socioeconomic History   Marital status: Married    Spouse name: [name]   Number of children: 4   Years of education: Not on file   Highest education level: Not on file  Occupational History   Occupation: Retired    Fish farm manager: UNEMPLOYED  Tobacco Use   Smoking status: Former Smoker    Packs/day: 0.50    Years: 10.00    Pack years: 5.00    Types: Cigarettes    Quit date: 04/06/1983    Years since quitting: 35.9   Smokeless tobacco: Never Used  Substance and Sexual Activity   Alcohol use: Yes    Alcohol/week: 0.0 standard drinks    Comment: `/26/2017 "might drink a beer q couple months mostly; summertime I might drink 2-3 beers/week"   Drug use: No   Sexual activity: Not Currently  Other Topics Concern   Not on file  Social History Narrative   Lives in [place] with spouse Yonny Buchberger).  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   Social Determinants of Health   Financial Resource Strain:    Difficulty of Paying Living Expenses: Not on file  Food Insecurity:    Worried About Charity fundraiser in the Last Year: Not on file   [employer] of Food in the Last Year: Not on file  Transportation Needs: No Transportation Needs   Lack  of Transportation (Medical): No   Lack of Transportation (Non-Medical): No  Physical Activity:    Days of Exercise per Week: Not on file   Minutes of Exercise per Session: Not on file  Stress:    Feeling of Stress : Not on file  Social Connections:    Frequency of Communication with Friends and Family: Not on file   Frequency of Social Gatherings with Friends and Family: Not on file   Attends Religious Services: Not on file   Active Member of Clubs or Organizations: Not on file   Attends Archivist Meetings: Not on file   Marital Status: Not on file  Intimate Partner Violence: Not At Risk   Fear of Current or Ex-Partner: No   Emotionally Abused: No   Physically Abused: No   Sexually Abused: No    Review of Systems  Per HPI.     Objective:   Vitals:   03/15/19 1543 03/15/19 1544  BP: (!) 193/84 (!) 184/84  Pulse: (!) 56   Resp: 12   Temp: 98.1 F (36.7 C)   SpO2: 99%   Weight: 254 lb (115.2 kg)   Height: 5\' 10"  (1.778 m)      Physical Exam Vitals reviewed.  Constitutional:      Appearance: He is well-developed.  HENT:     Head: Normocephalic and atraumatic.     Nose:     Comments: Edematous turbinates bilaterally. Eyes:     Pupils: Pupils are equal, round, and reactive to light.  Neck:     Vascular: No carotid bruit or JVD.  Cardiovascular:     Rate and Rhythm: Normal rate and regular rhythm.     Heart sounds: Normal heart sounds. No murmur.  Pulmonary:     Effort: Pulmonary effort is normal. No respiratory distress.     Breath sounds: Normal breath sounds. No stridor. No wheezing, rhonchi or rales.  Skin:    General: Skin is warm and dry.  Neurological:     Mental Status: He is alert and oriented to person, place, and time.        Assessment & Plan:  [name] is a 68 y.o. male . Essential hypertension  -Elevations may be multifactorial, denies missed doses of medication, but suspect over-the-counter cold medication  as well as sodium intake recently may be contributing.  -Cut back on sodium intake, stop over-the-counter cold medications that may have decongestant, follow-up in 1 week with cardiology as planned.  Currently asymptomatic, RTC/ER precautions given  Postnasal drip - Plan: fluticasone (FLONASE) 50 MCG/ACT nasal spray Cough - Plan: fluticasone (FLONASE) 50 MCG/ACT nasal spray, albuterol (VENTOLIN HFA) 108 (90 Base) MCG/ACT inhaler  -Lungs clear, suspect upper airway cough syndrome with postnasal drip.  Recommended fluticasone daily, albuterol only if needed for wheezing or shortness of breath.  Recheck in 2 weeks, sooner if worsening.  Meds ordered this encounter  Medications   fluticasone (FLONASE) 50 MCG/ACT nasal spray    Sig: Place 1-2 sprays into both nostrils daily.    Dispense:  16 g    Refill:  6   albuterol (VENTOLIN HFA) 108 (90 Base) MCG/ACT inhaler    Sig: Inhale 1-2 puffs into the lungs every 4 (four) hours as needed for wheezing or shortness of breath.    Dispense:  18 g    Refill:  0   Patient Instructions   Over the counter cold medications and increased sodium/salt in the diet is likely to blame for the elevated blood pressure.  Cut back on salt intake, stop the over-the-counter cold medicine.  Start Flonase nasal spray 1 to 2 sprays in each nostril every day.  Albuterol was prescribed but that should only  be used for shortness of breath or wheezing/asthma symptoms.   Keep follow-up with cardiology as planned in 1 week.  If any new symptoms during that time be seen here or the emergency room if acutely worsening.  Return to the clinic or go to the nearest emergency room if any of your symptoms worsen or new symptoms occur.    Postnasal Drip Postnasal drip is the feeling of mucus going down the back of your throat. Mucus is a slimy substance that moistens and cleans your nose and throat, as well as the air pockets in face bones near your forehead and cheeks (sinuses).  Small amounts of mucus pass from your nose and sinuses down the back of your throat all the time. This is normal. When you produce too much mucus or the mucus gets too thick, you can feel it. Some common causes of postnasal drip include:  Having more mucus because of: ? A cold or the flu. ? Allergies. ? Cold air. ? Certain medicines.  Having more mucus that is thicker because of: ? A sinus or nasal infection. ? Dry air. ? A food allergy. Follow these instructions at home: Relieving discomfort   Gargle with a salt-water mixture 3-4 times a day or as needed. To make a salt-water mixture, completely dissolve -1 tsp of salt in 1 cup of warm water.  If the air in your home is dry, use a humidifier to add moisture to the air.  Use a saline spray or container (neti pot) to flush out the nose (nasal irrigation). These methods can help clear away mucus and keep the nasal passages moist. General instructions  Take over-the-counter and prescription medicines only as told by your health care provider.  Follow instructions from your health care provider about eating or drinking restrictions. You may need to avoid caffeine.  Avoid things that you know you are allergic to (allergens), like dust, mold, pollen, pets, or certain foods.  Drink enough fluid to keep your urine pale yellow.  Keep all follow-up visits as told by your health care provider. This is important. Contact a health care provider if:  You have a fever.  You have a sore throat.  You have difficulty swallowing.  You have headache.  You have sinus pain.  You have a cough that does not go away.  The mucus from your nose becomes thick and is green or yellow in color.  You have cold or flu symptoms that last more than 10 days. Summary  Postnasal drip is the feeling of mucus going down the back of your throat.  If your health care provider approves, use nasal irrigation or a nasal spray 2?4 times a day.  Avoid  things that you know you are allergic to (allergens), like dust, mold, pollen, pets, or certain foods. This information is not intended to replace advice given to you by your health care provider. Make sure you discuss any questions you have with your health care provider. Document Released: 07/05/2016 Document Revised: 07/14/2018 Document Reviewed: 07/05/2016 Elsevier Patient Education  2020 [employer].  Hypertension, Adult High blood pressure (hypertension) is when the force of blood pumping through the arteries is too strong. The arteries are the blood vessels that carry blood from the heart throughout the body. Hypertension forces the heart to work harder to pump blood and may cause arteries to become narrow or stiff. Untreated or uncontrolled hypertension can cause a heart attack, heart failure, a stroke, kidney disease, and other problems. A  blood pressure reading consists of a higher number over a lower number. Ideally, your blood pressure should be below 120/80. The first ("top") number is called the systolic pressure. It is a measure of the pressure in your arteries as your heart beats. The second ("bottom") number is called the diastolic pressure. It is a measure of the pressure in your arteries as the heart relaxes. What are the causes? The exact cause of this condition is not known. There are some conditions that result in or are related to high blood pressure. What increases the risk? Some risk factors for high blood pressure are under your control. The following factors may make you more likely to develop this condition:  Smoking.  Having type 2 diabetes mellitus, high cholesterol, or both.  Not getting enough exercise or physical activity.  Being overweight.  Having too much fat, sugar, calories, or salt (sodium) in your diet.  Drinking too much alcohol. Some risk factors for high blood pressure may be difficult or impossible to change. Some of these factors  include:  Having chronic kidney disease.  Having a family history of high blood pressure.  Age. Risk increases with age.  Race. You may be at higher risk if you are African American.  Gender. Men are at higher risk than women before age 23. After age 107, women are at higher risk than men.  Having obstructive sleep apnea.  Stress. What are the signs or symptoms? High blood pressure may not cause symptoms. Very high blood pressure (hypertensive crisis) may cause:  Headache.  Anxiety.  Shortness of breath.  Nosebleed.  Nausea and vomiting.  Vision changes.  Severe chest pain.  Seizures. How is this diagnosed? This condition is diagnosed by measuring your blood pressure while you are seated, with your arm resting on a flat surface, your legs uncrossed, and your feet flat on the floor. The cuff of the blood pressure monitor will be placed directly against the skin of your upper arm at the level of your heart. It should be measured at least twice using the same arm. Certain conditions can cause a difference in blood pressure between your right and left arms. Certain factors can cause blood pressure readings to be lower or higher than normal for a short period of time:  When your blood pressure is higher when you are in a health care provider's office than when you are at home, this is called white coat hypertension. Most people with this condition do not need medicines.  When your blood pressure is higher at home than when you are in a health care provider's office, this is called masked hypertension. Most people with this condition may need medicines to control blood pressure. If you have a high blood pressure reading during one visit or you have normal blood pressure with other risk factors, you may be asked to:  Return on a different day to have your blood pressure checked again.  Monitor your blood pressure at home for 1 week or longer. If you are diagnosed with  hypertension, you may have other blood or imaging tests to help your health care provider understand your overall risk for other conditions. How is this treated? This condition is treated by making healthy lifestyle changes, such as eating healthy foods, exercising more, and reducing your alcohol intake. Your health care provider may prescribe medicine if lifestyle changes are not enough to get your blood pressure under control, and if:  Your systolic blood pressure is above 130.  Your diastolic blood pressure is above 80. Your personal target blood pressure may vary depending on your medical conditions, your age, and other factors. Follow these instructions at home: Eating and drinking   Eat a diet that is high in fiber and potassium, and low in sodium, added sugar, and fat. An example eating plan is called the DASH (Dietary Approaches to Stop Hypertension) diet. To eat this way: ? Eat plenty of fresh fruits and vegetables. Try to fill one half of your plate at each meal with fruits and vegetables. ? Eat whole grains, such as whole-wheat pasta, brown rice, or whole-grain bread. Fill about one fourth of your plate with whole grains. ? Eat or drink low-fat dairy products, such as skim milk or low-fat yogurt. ? Avoid fatty cuts of meat, processed or cured meats, and poultry with skin. Fill about one fourth of your plate with lean proteins, such as fish, chicken without skin, beans, eggs, or tofu. ? Avoid pre-made and processed foods. These tend to be higher in sodium, added sugar, and fat.  Reduce your daily sodium intake. Most people with hypertension should eat less than 1,500 mg of sodium a day.  Do not drink alcohol if: ? Your health care provider tells you not to drink. ? You are pregnant, may be pregnant, or are planning to become pregnant.  If you drink alcohol: ? Limit how much you use to:  0-1 drink a day for women.  0-2 drinks a day for men. ? Be aware of how much alcohol is in  your drink. In the [place] one drink equals one 12 oz bottle of beer (355 mL), one 5 oz glass of wine (148 mL), or one 1 oz glass of hard liquor (44 mL). Lifestyle   Work with your health care provider to maintain a healthy body weight or to lose weight. Ask what an ideal weight is for you.  Get at least 30 minutes of exercise most days of the week. Activities may include walking, swimming, or biking.  Include exercise to strengthen your muscles (resistance exercise), such as Pilates or lifting weights, as part of your weekly exercise routine. Try to do these types of exercises for 30 minutes at least 3 days a week.  Do not use any products that contain nicotine or tobacco, such as cigarettes, e-cigarettes, and chewing tobacco. If you need help quitting, ask your health care provider.  Monitor your blood pressure at home as told by your health care provider.  Keep all follow-up visits as told by your health care provider. This is important. Medicines  Take over-the-counter and prescription medicines only as told by your health care provider. Follow directions carefully. Blood pressure medicines must be taken as prescribed.  Do not skip doses of blood pressure medicine. Doing this puts you at risk for problems and can make the medicine less effective.  Ask your health care provider about side effects or reactions to medicines that you should watch for. Contact a health care provider if you:  Think you are having a reaction to a medicine you are taking.  Have headaches that keep coming back (recurring).  Feel dizzy.  Have swelling in your ankles.  Have trouble with your vision. Get help right away if you:  Develop a severe headache or confusion.  Have unusual weakness or numbness.  Feel faint.  Have severe pain in your chest or abdomen.  Vomit repeatedly.  Have trouble breathing. Summary  Hypertension is when the force of blood  pumping through your arteries is too strong.  If this condition is not controlled, it may put you at risk for serious complications.  Your personal target blood pressure may vary depending on your medical conditions, your age, and other factors. For most people, a normal blood pressure is less than 120/80.  Hypertension is treated with lifestyle changes, medicines, or a combination of both. Lifestyle changes include losing weight, eating a healthy, low-sodium diet, exercising more, and limiting alcohol. This information is not intended to replace advice given to you by your health care provider. Make sure you discuss any questions you have with your health care provider. Document Released: 03/22/2005 Document Revised: 11/30/2017 Document Reviewed: 11/30/2017 Elsevier Patient Education  [employer].   If you have lab work done today you will be contacted with your lab results within the next 2 weeks.  If you have not heard from [place] then please contact us. The fastest way to get your results is to register for My Chart.   IF you received an x-ray today, you will receive an invoice from [facility]. Please contact [facility] at [phone] with questions or concerns regarding your invoice.   IF you received labwork today, you will receive an invoice from [place]. Please contact [facility] at [phone] with questions or concerns regarding your invoice.   Our billing staff will not be able to assist you with questions regarding bills from these companies.  You will be contacted with the lab results as soon as they are available. The fastest way to get your results is to activate your My Chart account. Instructions are located on the last page of this paperwork. If you have not heard from [place] regarding the results in 2 weeks, please contact this office.          Signed, [name], MD [facility]

## 2019-03-21 ENCOUNTER — Other Ambulatory Visit: Payer: Self-pay | Admitting: Internal Medicine

## 2019-03-22 ENCOUNTER — Other Ambulatory Visit: Payer: Self-pay

## 2019-03-22 ENCOUNTER — Encounter: Payer: Self-pay | Admitting: Cardiology

## 2019-03-22 ENCOUNTER — Ambulatory Visit (INDEPENDENT_AMBULATORY_CARE_PROVIDER_SITE_OTHER): Payer: Federal, State, Local not specified - PPO | Admitting: Cardiology

## 2019-03-22 ENCOUNTER — Other Ambulatory Visit: Payer: Federal, State, Local not specified - PPO | Admitting: *Deleted

## 2019-03-22 ENCOUNTER — Telehealth: Payer: Self-pay | Admitting: Pharmacist

## 2019-03-22 VITALS — BP 140/80 | HR 65 | Ht 70.0 in | Wt 250.0 lb

## 2019-03-22 DIAGNOSIS — Z9989 Dependence on other enabling machines and devices: Secondary | ICD-10-CM

## 2019-03-22 DIAGNOSIS — I2510 Atherosclerotic heart disease of native coronary artery without angina pectoris: Secondary | ICD-10-CM | POA: Diagnosis not present

## 2019-03-22 DIAGNOSIS — E785 Hyperlipidemia, unspecified: Secondary | ICD-10-CM

## 2019-03-22 DIAGNOSIS — I10 Essential (primary) hypertension: Secondary | ICD-10-CM

## 2019-03-22 DIAGNOSIS — Z794 Long term (current) use of insulin: Secondary | ICD-10-CM | POA: Diagnosis not present

## 2019-03-22 DIAGNOSIS — E1159 Type 2 diabetes mellitus with other circulatory complications: Secondary | ICD-10-CM | POA: Diagnosis not present

## 2019-03-22 DIAGNOSIS — G4733 Obstructive sleep apnea (adult) (pediatric): Secondary | ICD-10-CM | POA: Diagnosis not present

## 2019-03-22 MED ORDER — HYDRALAZINE HCL 25 MG PO TABS: 25.0000 mg | ORAL_TABLET | Freq: Three times a day (TID) | ORAL | 3 refills | Status: DC

## 2019-03-22 NOTE — Patient Instructions (Signed)
Medication Instructions:  1) Start, Hydralazine 25 mg 3 times a day   *If you need a refill on your cardiac medications before your next appointment, please call your pharmacy*  Lab Work: None ordered   If you have labs (blood work) drawn today and your tests are completely normal, you will receive your results only by: Marland Kitchen MyChart Message (if you have MyChart) OR . A paper copy in the mail If you have any lab test that is abnormal or we need to change your treatment, we will call you to review the results.  Testing/Procedures: None ordered   Follow-Up: At [facility], you and your health needs are our priority.  As part of our continuing mission to provide you with exceptional heart care, we have created designated Provider Care Teams.  These Care Teams include your primary Cardiologist (physician) and Advanced Practice Providers (APPs -  Physician Assistants and Nurse Practitioners) who all work together to provide you with the care you need, when you need it.  Your next appointment:   6 months   The format for your next appointment:   In Person  Provider:   You may see [name], MD or one of the following Advanced Practice Providers on your designated Care Team:    Truitt Merle, NP  [name], NP  Kathyrn Drown, NP   Other Instructions None

## 2019-03-22 NOTE — Addendum Note (Signed)
Addended by: Freada Bergeron on: 03/22/2019 03:46 PM   Modules accepted: Orders

## 2019-03-22 NOTE — Telephone Encounter (Signed)
[employer] requesting labs w/ in 90 days. Called patient, he has an appointment at our office today. He has been off Repatha for about a month since the PA from [facility] ran out and there was some confusion as to if he was using [employer] or [place]. Will get a direct LDL today and then submit to [facility].   I offered patient a sample, but he declined saying he can wait.

## 2019-03-22 NOTE — Progress Notes (Signed)
Cardiology Office Note:    Date:  03/22/2019   ID:  [name], DOB [birth year], MRN [id number]  PCP:  Wendie Agreste, MD  Cardiologist:  [name], MD  Referring MD: No ref. provider found   Chief Complaint  Patient presents with  . Follow-up  . Coronary Artery Disease  . Hypertension  . Hyperlipidemia    History of Present Illness:    [name] is a 68 y.o. male with a past medical history significant for CADs/p DES to LCx 11/2014,OM1 not stented due to inability of stent to track(likely due to wire tangle),HTN, HLD, OSA not on CPAP and PAT/PAF(on anticoagulation due to low burden).Cath in 04/2015 showed widely patent circumflex stent, 95% stenosis of the first OM with failed PTCA due to inability to cross the stenosis.  He was continued on medical therapy.  He has been treated with radiation for prostate cancer.    He has had a history of difficult to control blood pressure.  When last seen in March and his blood pressure was still suboptimally controlled. It was recommended that he start spironolactone however he refused.  He apparently refused any additional medication.  He was advised on the harm of poorly controlled hypertension including risk of MI, stroke and CKD.  He did not want to start spiro due to possible gynecomastia.   He says that his BP was high a couple of weeks ago when he was drinking Twist, up to a gallon a day, which he says has a lot of sodium. He has stopped drinking it. He is trying to limit the salt in his diet. He is cooking his own food even though his wife is still bringing home fast food. His headaches have resolved.   At home BP: 140's-150's, and often up to the 160s-180s.   He was active over the summer doing yardwork and mowing the grass. He is not as active recently. He has had no chest pain/pressure or shortness of breath. No orthopnea, PND or edema. He says his sleep schedule is backwards, sleeping more during the day and  awake at night.   He still has not gotten his CPAP/BiPAP set up for sleep apnea.  He seems to think that CPAP will not work for him and he needs BiPAP.  His sleep study did not cover titration for BiPAP.  I have a hard time understanding the notes.  This is being followed by Dr. Radford Pax and Gershon Cull.  The patient now has a PCP and he is very pleased with his care from Dr. Carlota Raspberry.   Cardiac studies   Echocardiogram 03/08/2016 Study Conclusions  - Left ventricle: The cavity size was normal. Wall thickness was   increased in a pattern of mild LVH. Systolic function was normal.   The estimated ejection fraction was in the range of 55% to 60%.   Wall motion was normal; there were no regional wall motion   abnormalities. Doppler parameters are consistent with abnormal   left ventricular relaxation (grade 1 diastolic dysfunction). - Aorta: Aortic root dimension: 41 mm (ED). - Ascending aorta: The ascending aorta was mildly dilated. - Mitral valve: Moderately calcified annulus. There was systolic   anterior motion of the chordal structures. - Left atrium: The atrium was moderately dilated.  Impressions: - Compared to the prior study, there has been no significant   interval change.  Coronary Stent Intervention  Left Heart Cath and Coronary Angiography  05/01/2015  Conclusion  1. 1st  Diag lesion, 70% stenosed. 2. Ost 1st Diag to 1st Diag lesion, 70% stenosed. 3. 2nd Diag lesion, 75% stenosed. 4. Mid LAD to Dist LAD lesion, 60% stenosed. 5. Prox RCA to Dist RCA lesion, 25% stenosed. 6. Dist Cx lesion, 30% stenosed. 7. Ost 1st Diag lesion, 90% stenosed. 8. 1st Mrg-1 lesion, 95% stenosed. 9. 1st Mrg-2 lesion, 95% stenosed. Post intervention, there is a 95% residual stenosis. The lesion was previously treated with angioplasty.    Continued widely patent mid circumflex stent.  95% stenosis in the mid body of the first obtuse marginal, representing restenosis after angioplasty in August  reduced the lesion to 50%.  90% ostial first diagonal  Otherwise no change in anatomy compared to the prior post PCI angiogram  Normal LV function  Failed first obtuse marginal PTCA due to inability to cross the stenosis. Angulation in the vessel prevented torque ability and we will unable to solve lack of intra-coronary support.  Recommendation:  Continue medical therapy  Monitor overnight and discharge in a.m.  No change in medical regimen  Resume phase II cardiac rehabilitation      Past Medical History:  Diagnosis Date  . Adenomatous colon polyp 2011  . Anemia   . Angina pectoris ([place]) 05/01/2015   med rx for 95% OM (u/a to access due to tortuosity), 95% D1 and other moderate CAD at cath  . Arthritis    "back, knees" (05/01/2015)  . Chest pain    pleuritic  . Chronic lower back pain   . Complex tear of medial meniscus of left knee 09/14/2018  . Coronary artery disease   . Depression   . GERD (gastroesophageal reflux disease)   . HTN (hypertension)   . Hyperlipidemia   . Obstructive sleep apnea    noncompliant with CPAP  . Osteoarthritis   . Pneumonia ~ 2014 X 1  . Prostate cancer ([place])   . Refusal of blood transfusions as patient is Jehovah's Witness   . Rotator cuff arthropathy, right   . Rupture of right supraspinatus tendon 01/18/2019  . Type II or unspecified type diabetes mellitus without mention of complication, not stated as uncontrolled     Past Surgical History:  Procedure Laterality Date  . CARDIAC CATHETERIZATION  2004   patent coronary arteries  . CARDIAC CATHETERIZATION  05/01/2015   "tried to put stent in but couldn't"  . CARDIAC CATHETERIZATION N/A 05/01/2015   Procedure: Left Heart Cath and Coronary Angiography;  Surgeon: Belva Crome, MD; LAD 60%, oD1 90%, pD1 70%, D2 70%, CFX patent stent, 30% distal to prev stent, pRCA 20%, OM1 90/95%, NL LV  . CARDIAC CATHETERIZATION N/A 05/01/2015   Procedure: Coronary Stent Intervention;  Surgeon:  Belva Crome, MD;  Unsuccessful PCI OM due to tortuosity  . CARDIAC CATHETERIZATION N/A 11/06/2014   Procedure: Left Heart Cath and Coronary Angiography;  Surgeon: Belva Crome, MD;  Location: [facility];  Service: Cardiovascular;  Laterality: N/A;  . CLOSED REDUCTION SHOULDER DISLOCATION Right ~ 1975   "& reattached muscle"  . COLONOSCOPY W/ BIOPSIES AND POLYPECTOMY  X 2  . ESOPHAGOGASTRODUODENOSCOPY ENDOSCOPY    . KNEE ARTHROSCOPY WITH MEDIAL MENISECTOMY Left 09/14/2018   Procedure: LEFT KNEE ARTHROSCOPY CHONDROPLASY,  WITH MEDIAL MENISECTOMY;  Surgeon: Marchia Bond, MD;  Location: [facility];  Service: Orthopedics;  Laterality: Left;  . PILONIDAL CYST EXCISION    . SHOULDER ARTHROSCOPY WITH DEBRIDEMENT AND BICEP TENDON REPAIR Right 01/18/2019   Procedure: RIGHT SHOULDER ARTHROSCOPY  WITH DEBRIDEMENT AND DECOMPRSSION SUBACROMIAL PARTIAL ACROMIOPLASTY;  Surgeon: Marchia Bond, MD;  Location: [facility];  Service: Orthopedics;  Laterality: Right;  . SHOULDER ARTHROSCOPY WITH ROTATOR CUFF REPAIR Right 01/18/2019   Procedure: SHOULDER ARTHROSCOPY WITH ROTATOR CUFF REPAIR;  Surgeon: Marchia Bond, MD;  Location: [facility];  Service: Orthopedics;  Laterality: Right;    Current Medications: Current Meds  Medication Sig  . albuterol (VENTOLIN HFA) 108 (90 Base) MCG/ACT inhaler Inhale 1-2 puffs into the lungs every 4 (four) hours as needed for wheezing or shortness of breath.  . Blood Glucose Monitoring Suppl (RELION PREMIER BLU MONITOR) DEVI 1 Device by Does not apply route daily.  . Evolocumab (REPATHA SURECLICK) XX123456 MG/ML SOAJ Inject 1 pen into the skin every 14 (fourteen) days.  . fluticasone (FLONASE) 50 MCG/ACT nasal spray Place 1-2 sprays into both nostrils daily.  Marland Kitchen glipiZIDE (GLUCOTROL XL) 5 MG 24 hr tablet TAKE 1 TABLET BY MOUTH TWICE A DAY  . glucose blood test strip Use as instructed to check sugar 2 times daily  .  hydrochlorothiazide (HYDRODIURIL) 25 MG tablet Take 1 tablet (25 mg total) by mouth daily.  . Insulin Detemir (LEVEMIR FLEXTOUCH) 100 UNIT/ML Pen INJECT 20 UNITS INTO THE SKIN DAILY AT 10 PM.  . Insulin Pen Needle (NOVOFINE PLUS) 32G X 4 MM MISC Use 1x a day  . losartan (COZAAR) 100 MG tablet Take 1 tablet (100 mg total) by mouth daily.  . metFORMIN (GLUCOPHAGE) 500 MG tablet TAKE 2 TABLETS (1,000 MG TOTAL) BY MOUTH 2 (TWO) TIMES DAILY WITH A MEAL.  . metoprolol tartrate (LOPRESSOR) 50 MG tablet Take 1 tablet (50 mg total) by mouth 2 (two) times daily.  . nitroGLYCERIN (NITROSTAT) 0.4 MG SL tablet PLACE ONE TABLET UNDER THE TONGUE EVERY 5 MINUTES X3 DOSES AS NEEDED FOR CHEST PAIN     Allergies:   Statins and Imdur [isosorbide dinitrate]   Social History   Socioeconomic History  . Marital status: Married    Spouse name: [name]  . Number of children: 4  . Years of education: Not on file  . Highest education level: Not on file  Occupational History  . Occupation: Retired    Fish farm manager: UNEMPLOYED  Tobacco Use  . Smoking status: Former Smoker    Packs/day: 0.50    Years: 10.00    Pack years: 5.00    Types: Cigarettes    Quit date: 04/06/1983    Years since quitting: 35.9  . Smokeless tobacco: Never Used  Substance and Sexual Activity  . Alcohol use: Yes    Alcohol/week: 0.0 standard drinks    Comment: `/26/2017 "might drink a beer q couple months mostly; summertime I might drink 2-3 beers/week"  . Drug use: No  . Sexual activity: Not Currently  Other Topics Concern  . Not on file  Social History Narrative   Lives in [place] with spouse Lameek Gower).  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   Social Determinants of Health   Financial Resource Strain:   . Difficulty of Paying Living Expenses: Not on file  Food Insecurity:   . Worried About Charity fundraiser in the Last Year: Not on file  . Ran Out of Food in the Last  Year: Not on file  Transportation Needs: No Transportation Needs  . Lack of Transportation (Medical): No  . Lack of Transportation (Non-Medical): No  Physical Activity:   . Days  of Exercise per Week: Not on file  . Minutes of Exercise per Session: Not on file  Stress:   . Feeling of Stress : Not on file  Social Connections:   . Frequency of Communication with Friends and Family: Not on file  . Frequency of Social Gatherings with Friends and Family: Not on file  . Attends Religious Services: Not on file  . Active Member of Clubs or Organizations: Not on file  . Attends Archivist Meetings: Not on file  . Marital Status: Not on file     Family History: The patient's family history includes Alcohol abuse in an other family member; Colon cancer in his paternal uncle; Coronary artery disease in his mother and another family member; Diabetes in his maternal uncle, mother, paternal uncle, and sister; Heart disease in his mother; Other in his father; Stroke in his mother. There is no history of Colon polyps or Esophageal cancer. ROS:   Please see the history of present illness.     All other systems reviewed and are negative.   EKG:  EKG is not ordered today.   Recent Labs: 09/25/2018: ALT 18 01/15/2019: BUN 17; Creatinine, Ser 0.94; Potassium 3.9; Sodium 137   Recent Lipid Panel    Component Value Date/Time   CHOL 111 09/25/2018 0930   TRIG 117 09/25/2018 0930   HDL 44 09/25/2018 0930   CHOLHDL 2.5 09/25/2018 0930   CHOLHDL 4 06/19/2015 1624   VLDL 12.2 06/19/2015 1624   LDLCALC 44 09/25/2018 0930   LDLDIRECT 117 (H) 01/17/2018 1456   LDLDIRECT 168.0 12/18/2012 1340    Physical Exam:    VS:  BP 140/80   Pulse 65   Ht 5\' 10"  (1.778 m)   Wt 250 lb (113.4 kg)   SpO2 99%   BMI 35.87 kg/m     Wt Readings from Last 6 Encounters:  03/22/19 250 lb (113.4 kg)  03/15/19 254 lb (115.2 kg)  02/13/19 251 lb (113.9 kg)  01/18/19 252 lb 13.9 oz (114.7 kg)  10/05/18 254  lb (115.2 kg)  09/14/18 251 lb 1.7 oz (113.9 kg)     Physical Exam  Constitutional: He is oriented to person, place, and time. He appears well-developed and well-nourished. No distress.  HENT:  Head: Normocephalic and atraumatic.  Neck: No JVD present.  Cardiovascular: Normal rate, regular rhythm, normal heart sounds and intact distal pulses. Exam reveals no gallop and no friction rub.  No murmur heard. Pulmonary/Chest: Effort normal and breath sounds normal. No respiratory distress. He has no wheezes. He has no rales.  Abdominal: Soft. Bowel sounds are normal.  Musculoskeletal:        General: No edema. Normal range of motion.     Cervical back: Normal range of motion and neck supple.  Neurological: He is alert and oriented to person, place, and time.  Skin: Skin is warm and dry.  Psychiatric: He has a normal mood and affect. His behavior is normal. Judgment and thought content normal.  Vitals reviewed.    ASSESSMENT:    1. Essential (primary) hypertension   2. Coronary artery disease involving native coronary artery of native heart without angina pectoris   3. Hyperlipidemia LDL goal <70   4. Type 2 diabetes mellitus with other circulatory complication, with long-term current use of insulin ([place])   5. Obstructive sleep apnea    PLAN:    In order of problems listed above:  Hypertension -The patient has a history of difficult to control  blood pressures.  When seen in March his blood pressure was suboptimal.  The patient refused addition of any further medications despite being advised of increased risk of MI, stroke and CKD with poorly controlled hypertension. -Patient trying to follow low-sodium diet. -Moderate control, not optimal. Pt will not take spironolactone due to possible gynecomastia. He is willing to try hydralazine. He thinks he took it in the past but does not recall how he reacted. He would like to try again.  -He has F/U with PCP and can recheck.   CAD -No  anginal symptoms.  Continue current medical therapy.  Hyperlipidemia -Patient has declined taking statin in the past.  Now on PCSK9 inhibitor therapy with Repatha per our lipid clinic. -LDL was 44 in 09/2018, very well controlled.  Continue current therapy. -He will have a direct LDL checked today per our lipid clinic pharmacist.  Diabetes type 2 -Followed by endocrinology  OSA, severe -Dr. Radford Pax had a long discussion with the patient on the telephone 02/23/2019 explaining the process of getting therapy for OSA -Pt still does not have a machine. I am not sure if he needs another test to set up BiPap.  Gershon Cull and Dr. Radford Pax are following this.  Medication Adjustments/Labs and Tests Ordered: Current medicines are reviewed at length with the patient today.  Concerns regarding medicines are outlined above. Labs and tests ordered and medication changes are outlined in the patient instructions below:  Patient Instructions  Medication Instructions:  1) Start, Hydralazine 25 mg 3 times a day   *If you need a refill on your cardiac medications before your next appointment, please call your pharmacy*  Lab Work: None ordered   If you have labs (blood work) drawn today and your tests are completely normal, you will receive your results only by: Marland Kitchen MyChart Message (if you have MyChart) OR . A paper copy in the mail If you have any lab test that is abnormal or we need to change your treatment, we will call you to review the results.  Testing/Procedures: None ordered   Follow-Up: At [facility], you and your health needs are our priority.  As part of our continuing mission to provide you with exceptional heart care, we have created designated Provider Care Teams.  These Care Teams include your primary Cardiologist (physician) and Advanced Practice Providers (APPs -  Physician Assistants and Nurse Practitioners) who all work together to provide you with the care you need, when you need it.   Your next appointment:   6 months   The format for your next appointment:   In Person  Provider:   You may see [name], MD or one of the following Advanced Practice Providers on your designated Care Team:    Truitt Merle, NP  [name], NP  Kathyrn Drown, NP   Other Instructions None      Signed, Daune Perch, NP  03/22/2019 4:08 PM    [place]

## 2019-03-22 NOTE — Telephone Encounter (Signed)
bipap titration sent to sleep pool.

## 2019-03-23 LAB — LDL CHOLESTEROL, DIRECT: LDL Direct: 131 mg/dL — ABNORMAL HIGH (ref 0–99)

## 2019-03-28 ENCOUNTER — Telehealth: Payer: Self-pay | Admitting: *Deleted

## 2019-03-28 NOTE — Telephone Encounter (Signed)
[place] PA for BIPAP titration done verbally over the phone. Clinicals faxed to [phone]. Pending case # [id number].  Per [employer] rep they are the patient's primary insurance. I also submitted [employer] auth whice was approved. [employer] approval # is [id number]. Valid dates 03/28/19 to 06/26/19.

## 2019-04-03 ENCOUNTER — Encounter: Payer: Self-pay | Admitting: Internal Medicine

## 2019-04-03 ENCOUNTER — Other Ambulatory Visit: Payer: Self-pay | Admitting: Pharmacist

## 2019-04-03 DIAGNOSIS — H524 Presbyopia: Secondary | ICD-10-CM | POA: Diagnosis not present

## 2019-04-03 DIAGNOSIS — H25093 Other age-related incipient cataract, bilateral: Secondary | ICD-10-CM | POA: Diagnosis not present

## 2019-04-03 DIAGNOSIS — E119 Type 2 diabetes mellitus without complications: Secondary | ICD-10-CM | POA: Diagnosis not present

## 2019-04-03 DIAGNOSIS — H5203 Hypermetropia, bilateral: Secondary | ICD-10-CM | POA: Diagnosis not present

## 2019-04-03 DIAGNOSIS — Z7984 Long term (current) use of oral hypoglycemic drugs: Secondary | ICD-10-CM | POA: Diagnosis not present

## 2019-04-03 LAB — HM DIABETES EYE EXAM

## 2019-04-03 MED ORDER — REPATHA SURECLICK 140 MG/ML ~~LOC~~ SOAJ: 1.0000 "pen " | SUBCUTANEOUS | 11 refills | Status: DC

## 2019-04-03 NOTE — Telephone Encounter (Signed)
Appeals overturned, Repatha re-approved through 04/01/20. Pt is aware.

## 2019-04-04 ENCOUNTER — Ambulatory Visit: Payer: Federal, State, Local not specified - PPO | Attending: Internal Medicine

## 2019-04-04 DIAGNOSIS — T466X5A Adverse effect of antihyperlipidemic and antiarteriosclerotic drugs, initial encounter: Secondary | ICD-10-CM | POA: Insufficient documentation

## 2019-04-04 DIAGNOSIS — U071 COVID-19: Secondary | ICD-10-CM

## 2019-04-04 DIAGNOSIS — G720 Drug-induced myopathy: Secondary | ICD-10-CM | POA: Insufficient documentation

## 2019-04-04 DIAGNOSIS — R238 Other skin changes: Secondary | ICD-10-CM | POA: Diagnosis not present

## 2019-04-04 DIAGNOSIS — N3941 Urge incontinence: Secondary | ICD-10-CM | POA: Diagnosis not present

## 2019-04-04 DIAGNOSIS — C61 Malignant neoplasm of prostate: Secondary | ICD-10-CM | POA: Diagnosis not present

## 2019-04-05 LAB — NOVEL CORONAVIRUS, NAA: SARS-CoV-2, NAA: NOT DETECTED

## 2019-04-08 ENCOUNTER — Other Ambulatory Visit: Payer: Self-pay | Admitting: Family Medicine

## 2019-04-08 DIAGNOSIS — R05 Cough: Secondary | ICD-10-CM

## 2019-04-08 DIAGNOSIS — R059 Cough, unspecified: Secondary | ICD-10-CM

## 2019-04-09 ENCOUNTER — Telehealth: Payer: Self-pay | Admitting: *Deleted

## 2019-04-09 ENCOUNTER — Encounter: Payer: Self-pay | Admitting: Family Medicine

## 2019-04-09 ENCOUNTER — Ambulatory Visit (INDEPENDENT_AMBULATORY_CARE_PROVIDER_SITE_OTHER): Payer: PPO | Admitting: Family Medicine

## 2019-04-09 ENCOUNTER — Other Ambulatory Visit: Payer: Self-pay

## 2019-04-09 VITALS — BP 170/86 | HR 66 | Temp 97.9°F | Resp 14 | Ht 70.0 in | Wt 253.0 lb

## 2019-04-09 DIAGNOSIS — R5383 Other fatigue: Secondary | ICD-10-CM

## 2019-04-09 DIAGNOSIS — E1159 Type 2 diabetes mellitus with other circulatory complications: Secondary | ICD-10-CM

## 2019-04-09 DIAGNOSIS — F329 Major depressive disorder, single episode, unspecified: Secondary | ICD-10-CM

## 2019-04-09 DIAGNOSIS — G4733 Obstructive sleep apnea (adult) (pediatric): Secondary | ICD-10-CM

## 2019-04-09 DIAGNOSIS — I10 Essential (primary) hypertension: Secondary | ICD-10-CM | POA: Diagnosis not present

## 2019-04-09 DIAGNOSIS — Z794 Long term (current) use of insulin: Secondary | ICD-10-CM | POA: Diagnosis not present

## 2019-04-09 DIAGNOSIS — F32A Depression, unspecified: Secondary | ICD-10-CM

## 2019-04-09 MED ORDER — SERTRALINE HCL 25 MG PO TABS: 12.5000 mg | ORAL_TABLET | Freq: Every day | ORAL | 1 refills | Status: DC

## 2019-04-09 NOTE — Telephone Encounter (Signed)
Staff message sent to Charlott Holler received from [employer] for BIPAP titration. Auth [id number]. Valid dates 03/28/19 to 06/26/19.

## 2019-04-09 NOTE — Progress Notes (Signed)
Subjective:  Patient ID: [name], male    DOB: [birth year]  Age: 69 y.o. MRN: [id number]  CC:  Chief Complaint  Patient presents with  . Follow-up    2 week follow up on pt's BP. pt states feeling sleepy,dizzy ,and faint offten. started a while back per pt. Incressed last month 12/2020per pt.    HPI [name] presents for    Follow-up from establish care visit.  Last seen December 10.  History of CAD, insulin-dependent diabetes, paroxysmal atrial tachycardia, OSA, BPH, prostate cancer status post radiation, hyperlipidemia.  Followed by [facility] for diabetes, cardiology, Dr. [name].  Appointment with Daune Perch December 17.  Hypertension: Elevated readings in December thought to be possibly multifactorial with cold medicines as well as increased sodium intake.  Advised to stop decongestants, plan for cardiology follow-up 1 week later. Cardiology follow-up December 17.  Blood pressure 140/80 at that time.  He had cut back on soda that apparently was high in sodium.  Still some fast food.  Had not yet started CPAP/BiPAP for sleep apnea. Started on hydralazine 25 mg 3 times daily.  Only taking hydralazine once per day. Did not read the pill bottle - just started about a week ago. Feeling more tired/sleepy past few months. No syncope.  Decreased energy. Waiting on pending approval for cpap fitting.  No chest pains.  No headache.  No focal weakness.  No slurred speech.  decreased interest in things, feeling depressed past 6 months or so.  Treated for depression in past with wellbutrin - not sure it helped.  No other meds known.sister took zoloft - did ok that he knows of.  ? Depressed in winter time. Loss of motivation.  No suicide thoughts.  Denies acute stressors. Would like to try med again. Denies symptomatic low blood sugars. 140 few days ago.  No readings under 100.   Lab Results  Component Value Date   WBC 6.5 05/18/2017   HGB 13.8 05/18/2017   HCT 42.4  05/18/2017   MCV 85.1 05/18/2017   PLT 216 05/18/2017     Depression screen PHQ 2/9 04/09/2019 03/15/2019 12/30/2014  Decreased Interest 0 3 0  Down, Depressed, Hopeless 0 1 0  PHQ - 2 Score 0 4 0  Altered sleeping - 3 -  Tired, decreased energy - 3 -  Change in appetite - 3 -  Feeling bad or failure about yourself  - 0 -  Trouble concentrating - 0 -  Moving slowly or fidgety/restless - 0 -  Suicidal thoughts - 0 -  PHQ-9 Score - 13 -  Difficult doing work/chores - Not difficult at all -  Some recent data might be hidden     Home readings: BP Readings from Last 3 Encounters:  04/09/19 (!) 170/86  03/22/19 140/80  03/15/19 (!) 184/84   Lab Results  Component Value Date   CREATININE 0.94 01/15/2019        History Patient Active Problem List   Diagnosis Date Noted  . Statin myopathy 04/04/2019  . Statin intolerance -weakness, stiffness 02/13/2019  . Rupture of right supraspinatus tendon 01/18/2019  . Complex tear of medial meniscus of left knee 09/14/2018  . Malignant neoplasm of prostate ([place]) 01/04/2018  . Refusal of blood transfusions as patient is Jehovah's Witness   . Pneumonia   . Osteoarthritis   . Obstructive sleep apnea   . Hyperlipidemia   . HTN (hypertension)   . GERD (gastroesophageal reflux disease)   . Depression   .  Coronary artery disease   . Chronic lower back pain   . Chest pain   . Arthritis   . Anemia   . Benign essential HTN 01/03/2017  . PAT (paroxysmal atrial tachycardia) ([place]) 10/27/2015  . Type 2 diabetes mellitus with circulatory disorder, without long-term current use of insulin ([place]) 06/19/2015  . Angina pectoris ([place])   . Elevated PSA 12/20/2014  . CAD (coronary artery disease)   . Obstructive sleep apnea 09/06/2014  . Personal history of colonic polyps 12/13/2013  . Blood in stool 12/13/2013  . Prostatitis, acute 12/13/2013  . Insomnia 04/19/2013  . Obesity with body mass index of 30.0-39.9 10/18/2012  . Right lumbar  radiculitis 09/29/2011  . Adenomatous colon polyp 04/05/2009  . ORGANIC IMPOTENCE 11/11/2008  . Hyperlipidemia LDL goal <70 08/23/2008  . GERD 08/23/2008  . BPH (benign prostatic hypertrophy) with urinary obstruction 08/23/2008  . OSTEOARTHRITIS 08/23/2008   Past Medical History:  Diagnosis Date  . Adenomatous colon polyp 2011  . Anemia   . Angina pectoris ([place]) 05/01/2015   med rx for 95% OM (u/a to access due to tortuosity), 95% D1 and other moderate CAD at cath  . Arthritis    "back, knees" (05/01/2015)  . Chest pain    pleuritic  . Chronic lower back pain   . Complex tear of medial meniscus of left knee 09/14/2018  . Coronary artery disease   . Depression   . GERD (gastroesophageal reflux disease)   . HTN (hypertension)   . Hyperlipidemia   . Obstructive sleep apnea    noncompliant with CPAP  . Osteoarthritis   . Pneumonia ~ 2014 X 1  . Prostate cancer ([place])   . Refusal of blood transfusions as patient is Jehovah's Witness   . Rotator cuff arthropathy, right   . Rupture of right supraspinatus tendon 01/18/2019  . Type II or unspecified type diabetes mellitus without mention of complication, not stated as uncontrolled    Past Surgical History:  Procedure Laterality Date  . CARDIAC CATHETERIZATION  2004   patent coronary arteries  . CARDIAC CATHETERIZATION  05/01/2015   "tried to put stent in but couldn't"  . CARDIAC CATHETERIZATION N/A 05/01/2015   Procedure: Left Heart Cath and Coronary Angiography;  Surgeon: Belva Crome, MD; LAD 60%, oD1 90%, pD1 70%, D2 70%, CFX patent stent, 30% distal to prev stent, pRCA 20%, OM1 90/95%, NL LV  . CARDIAC CATHETERIZATION N/A 05/01/2015   Procedure: Coronary Stent Intervention;  Surgeon: Belva Crome, MD;  Unsuccessful PCI OM due to tortuosity  . CARDIAC CATHETERIZATION N/A 11/06/2014   Procedure: Left Heart Cath and Coronary Angiography;  Surgeon: Belva Crome, MD;  Location: [facility];  Service: Cardiovascular;  Laterality:  N/A;  . CLOSED REDUCTION SHOULDER DISLOCATION Right ~ 1975   "& reattached muscle"  . COLONOSCOPY W/ BIOPSIES AND POLYPECTOMY  X 2  . ESOPHAGOGASTRODUODENOSCOPY ENDOSCOPY    . KNEE ARTHROSCOPY WITH MEDIAL MENISECTOMY Left 09/14/2018   Procedure: LEFT KNEE ARTHROSCOPY CHONDROPLASY,  WITH MEDIAL MENISECTOMY;  Surgeon: Marchia Bond, MD;  Location: [facility];  Service: Orthopedics;  Laterality: Left;  . PILONIDAL CYST EXCISION    . SHOULDER ARTHROSCOPY WITH DEBRIDEMENT AND BICEP TENDON REPAIR Right 01/18/2019   Procedure: RIGHT SHOULDER ARTHROSCOPY WITH DEBRIDEMENT AND DECOMPRSSION SUBACROMIAL PARTIAL ACROMIOPLASTY;  Surgeon: Marchia Bond, MD;  Location: [facility];  Service: Orthopedics;  Laterality: Right;  . SHOULDER ARTHROSCOPY WITH ROTATOR CUFF REPAIR Right 01/18/2019   Procedure:  SHOULDER ARTHROSCOPY WITH ROTATOR CUFF REPAIR;  Surgeon: Marchia Bond, MD;  Location: [facility];  Service: Orthopedics;  Laterality: Right;   Allergies  Allergen Reactions  . Statins Other (See Comments)    REACTION: joint pain Lipitor- headaches Has also tried Livalo, pravachol, zetia, crestor, welchol  . Imdur [Isosorbide Dinitrate]     Headache - "violent"   Prior to Admission medications   Medication Sig Start Date End Date Taking? Authorizing Provider  albuterol (VENTOLIN HFA) 108 (90 Base) MCG/ACT inhaler Inhale 1-2 puffs into the lungs every 4 (four) hours as needed for wheezing or shortness of breath. 03/15/19  Yes Wendie Agreste, MD  Blood Glucose Monitoring Suppl (RELION PREMIER BLU MONITOR) DEVI 1 Device by Does not apply route daily. 07/12/16  Yes [name], MD  fluticasone (FLONASE) 50 MCG/ACT nasal spray Place 1-2 sprays into both nostrils daily. 03/15/19  Yes Wendie Agreste, MD  glipiZIDE (GLUCOTROL XL) 5 MG 24 hr tablet TAKE 1 TABLET BY MOUTH TWICE A DAY 03/22/19  Yes [name], MD  glucose blood test strip Use as instructed  to check sugar 2 times daily 07/12/16  Yes [name], MD  hydrALAZINE (APRESOLINE) 25 MG tablet Take 1 tablet (25 mg total) by mouth 3 (three) times daily. 03/22/19  Yes Daune Perch, NP  hydrochlorothiazide (HYDRODIURIL) 25 MG tablet Take 1 tablet (25 mg total) by mouth daily. 05/05/18  Yes Belva Crome, MD  Insulin Detemir (LEVEMIR FLEXTOUCH) 100 UNIT/ML Pen INJECT 20 UNITS INTO THE SKIN DAILY AT 10 PM. 02/13/19  Yes [name], MD  Insulin Pen Needle (NOVOFINE PLUS) 32G X 4 MM MISC Use 1x a day 05/09/14  Yes [name], MD  losartan (COZAAR) 100 MG tablet Take 1 tablet (100 mg total) by mouth daily. 05/05/18  Yes Belva Crome, MD  metFORMIN (GLUCOPHAGE) 500 MG tablet TAKE 2 TABLETS (1,000 MG TOTAL) BY MOUTH 2 (TWO) TIMES DAILY WITH A MEAL. 10/17/18  Yes [name], MD  metoprolol tartrate (LOPRESSOR) 50 MG tablet Take 1 tablet (50 mg total) by mouth 2 (two) times daily. 04/18/18  Yes Belva Crome, MD  nitroGLYCERIN (NITROSTAT) 0.4 MG SL tablet PLACE ONE TABLET UNDER THE TONGUE EVERY 5 MINUTES X3 DOSES AS NEEDED FOR CHEST PAIN 09/27/18  Yes Belva Crome, MD  Evolocumab (REPATHA SURECLICK) 951 MG/ML SOAJ Inject 1 pen into the skin every 14 (fourteen) days. Patient not taking: Reported on 04/09/2019 04/03/19   Belva Crome, MD   Social History   Socioeconomic History  . Marital status: Married    Spouse name: [name]  . Number of children: 4  . Years of education: Not on file  . Highest education level: Not on file  Occupational History  . Occupation: Retired    Fish farm manager: UNEMPLOYED  Tobacco Use  . Smoking status: Former Smoker    Packs/day: 0.50    Years: 10.00    Pack years: 5.00    Types: Cigarettes    Quit date: 04/06/1983    Years since quitting: 36.0  . Smokeless tobacco: Never Used  Substance and Sexual Activity  . Alcohol use: Yes    Alcohol/week: 0.0 standard drinks    Comment: `/26/2017 "might drink a beer q couple months mostly; summertime I  might drink 2-3 beers/week"  . Drug use: No  . Sexual activity: Not Currently  Other Topics Concern  . Not on file  Social History Narrative   Lives in [place] with spouse [name]  Cletus Gash).  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   Social Determinants of Health   Financial Resource Strain:   . Difficulty of Paying Living Expenses: Not on file  Food Insecurity:   . Worried About Charity fundraiser in the Last Year: Not on file  . Ran Out of Food in the Last Year: Not on file  Transportation Needs: No Transportation Needs  . Lack of Transportation (Medical): No  . Lack of Transportation (Non-Medical): No  Physical Activity:   . Days of Exercise per Week: Not on file  . Minutes of Exercise per Session: Not on file  Stress:   . Feeling of Stress : Not on file  Social Connections:   . Frequency of Communication with Friends and Family: Not on file  . Frequency of Social Gatherings with Friends and Family: Not on file  . Attends Religious Services: Not on file  . Active Member of Clubs or Organizations: Not on file  . Attends Archivist Meetings: Not on file  . Marital Status: Not on file  Intimate Partner Violence: Not At Risk  . Fear of Current or Ex-Partner: No  . Emotionally Abused: No  . Physically Abused: No  . Sexually Abused: No    Review of Systems Per HPI.   Objective:   Vitals:   04/09/19 1610 04/09/19 1620  BP: (!) 181/89 (!) 170/86  Pulse: 66   Resp: 14   Temp: 97.9 F (36.6 C)   TempSrc: Temporal   SpO2: 96%   Weight: 253 lb (114.8 kg)   Height: _0  (1.778 m)      Physical Exam Vitals reviewed.  Constitutional:      Appearance: He is well-developed.  HENT:     Head: Normocephalic and atraumatic.  Eyes:     Pupils: Pupils are equal, round, and reactive to light.  Neck:     Vascular: No carotid bruit or JVD.  Cardiovascular:     Rate and Rhythm: Normal rate and regular rhythm.      Heart sounds: Normal heart sounds. No murmur.  Pulmonary:     Effort: Pulmonary effort is normal.     Breath sounds: Normal breath sounds. No rales.  Musculoskeletal:     Right lower leg: Edema (Trace bilateral) present.     Left lower leg: Edema present.  Skin:    General: Skin is warm and dry.  Neurological:     Mental Status: He is alert and oriented to person, place, and time.  Psychiatric:        Mood and Affect: Mood normal. Affect is flat.        Speech: Speech normal.        Behavior: Behavior normal.        Thought Content: Thought content normal. Thought content does not include homicidal or suicidal ideation.        Assessment & Plan:  [name] is a 69 y.o. male . Depression, unspecified depression type - Plan: sertraline (ZOLOFT) 25 MG tablet, TSH  -May be contributing to fatigue.  Denies SI.  Check TSH, start low-dose Zoloft 12.5 mg daily.  Recheck in 3 weeks, telemedicine visit if needed given current pandemic.  Essential hypertension - Plan: Basic metabolic panel  -Decreased control, but not yet adherent to hydralazine, dosing discussed.  Home monitoring with RTC precautions.  OSA (obstructive sleep apnea) Fatigue, unspecified type - Plan: Basic metabolic panel,  CBC, TSH  -Suspect fatigue will improve once sleep apnea treated, as well as likely improved blood pressure control.  Type 2 diabetes mellitus with other circulatory complication, with long-term current use of insulin ([place]) - Plan: Basic metabolic panel  -Check labs with BMP, TSH for fatigue, denies any true hypoglycemia  Meds ordered this encounter  Medications  . sertraline (ZOLOFT) 25 MG tablet    Sig: Take 0.5 tablets (12.5 mg total) by mouth daily.    Dispense:  45 tablet    Refill:  1   Patient Instructions   Start low dose zoloft initially 1/2 pill once per day for depression.  Hydralazine was written for 3 times per day.  I will try to take that at least twice per day to see if  you can tolerate that and for lower blood pressure readings. Follow-up in 3 weeks for fatigue and to discuss depression further.  That can be a telemedicine visit.  Monitor your home blood pressure readings for that visit.  CPAP/obstructive sleep apnea treatment should also help with fatigue.  Return to the clinic or go to the nearest emergency room if any of your symptoms worsen or new symptoms occur.   Fatigue If you have fatigue, you feel tired all the time and have a lack of energy or a lack of motivation. Fatigue may make it difficult to start or complete tasks because of exhaustion. In general, occasional or mild fatigue is often a normal response to activity or life. However, long-lasting (chronic) or extreme fatigue may be a symptom of a medical condition. Follow these instructions at home: General instructions  Watch your fatigue for any changes.  Go to bed and get up at the same time every day.  Avoid fatigue by pacing yourself during the day and getting enough sleep at night.  Maintain a healthy weight. Medicines  Take over-the-counter and prescription medicines only as told by your health care provider.  Take a multivitamin, if told by your health care provider.  Do not use herbal or dietary supplements unless they are approved by your health care provider. Activity   Exercise regularly, as told by your health care provider.  Use or practice techniques to help you relax, such as yoga, tai chi, meditation, or massage therapy. Eating and drinking   Avoid heavy meals in the evening.  Eat a well-balanced diet, which includes lean proteins, whole grains, plenty of fruits and vegetables, and low-fat dairy products.  Avoid consuming too much caffeine.  Avoid the use of alcohol.  Drink enough fluid to keep your urine pale yellow. Lifestyle  Change situations that cause you stress. Try to keep your work and personal schedule in balance.  Do not use any products that  contain nicotine or tobacco, such as cigarettes and e-cigarettes. If you need help quitting, ask your health care provider.  Do not use drugs. Contact a health care provider if:  Your fatigue does not get better.  You have a fever.  You suddenly lose or gain weight.  You have headaches.  You have trouble falling asleep or sleeping through the night.  You feel angry, guilty, anxious, or sad.  You are unable to have a bowel movement (constipation).  Your skin is dry.  You have swelling in your legs or another part of your body. Get help right away if:  You feel confused.  Your vision is blurry.  You feel faint or you pass out.  You have a severe headache.  You  have severe pain in your abdomen, your back, or the area between your waist and hips (pelvis).  You have chest pain, shortness of breath, or an irregular or fast heartbeat.  You are unable to urinate, or you urinate less than normal.  You have abnormal bleeding, such as bleeding from the rectum, vagina, nose, lungs, or nipples.  You vomit blood.  You have thoughts about hurting yourself or others. If you ever feel like you may hurt yourself or others, or have thoughts about taking your own life, get help right away. You can go to your nearest emergency department or call:  Your local emergency services (911 in the [place]  A suicide crisis helpline, such as the [facility] at 513-447-3977. This is open 24 hours a day. Summary  If you have fatigue, you feel tired all the time and have a lack of energy or a lack of motivation.  Fatigue may make it difficult to start or complete tasks because of exhaustion.  Long-lasting (chronic) or extreme fatigue may be a symptom of a medical condition.  Exercise regularly, as told by your health care provider.  Change situations that cause you stress. Try to keep your work and personal schedule in balance. This information is not intended to  replace advice given to you by your health care provider. Make sure you discuss any questions you have with your health care provider. Document Revised: 10/11/2018 Document Reviewed: 12/15/2016 Elsevier Patient Education  [employer].      If you have lab work done today you will be contacted with your lab results within the next 2 weeks.  If you have not heard from [place] then please contact us. The fastest way to get your results is to register for My Chart.   IF you received an x-ray today, you will receive an invoice from [facility]. Please contact [facility] at [phone] with questions or concerns regarding your invoice.   IF you received labwork today, you will receive an invoice from [facility]. Please contact [facility] at [phone] with questions or concerns regarding your invoice.   Our billing staff will not be able to assist you with questions regarding bills from these companies.  You will be contacted with the lab results as soon as they are available. The fastest way to get your results is to activate your My Chart account. Instructions are located on the last page of this paperwork. If you have not heard from [place] regarding the results in 2 weeks, please contact this office.         Signed, [name], MD [facility]

## 2019-04-09 NOTE — Patient Instructions (Addendum)
Start low dose zoloft initially 1/2 pill once per day for depression.  Hydralazine was written for 3 times per day.  I will try to take that at least twice per day to see if you can tolerate that and for lower blood pressure readings. Follow-up in 3 weeks for fatigue and to discuss depression further.  That can be a telemedicine visit.  Monitor your home blood pressure readings for that visit.  CPAP/obstructive sleep apnea treatment should also help with fatigue.  Return to the clinic or go to the nearest emergency room if any of your symptoms worsen or new symptoms occur.   Fatigue If you have fatigue, you feel tired all the time and have a lack of energy or a lack of motivation. Fatigue may make it difficult to start or complete tasks because of exhaustion. In general, occasional or mild fatigue is often a normal response to activity or life. However, long-lasting (chronic) or extreme fatigue may be a symptom of a medical condition. Follow these instructions at home: General instructions  Watch your fatigue for any changes.  Go to bed and get up at the same time every day.  Avoid fatigue by pacing yourself during the day and getting enough sleep at night.  Maintain a healthy weight. Medicines  Take over-the-counter and prescription medicines only as told by your health care provider.  Take a multivitamin, if told by your health care provider.  Do not use herbal or dietary supplements unless they are approved by your health care provider. Activity   Exercise regularly, as told by your health care provider.  Use or practice techniques to help you relax, such as yoga, tai chi, meditation, or massage therapy. Eating and drinking   Avoid heavy meals in the evening.  Eat a well-balanced diet, which includes lean proteins, whole grains, plenty of fruits and vegetables, and low-fat dairy products.  Avoid consuming too much caffeine.  Avoid the use of alcohol.  Drink enough fluid  to keep your urine pale yellow. Lifestyle  Change situations that cause you stress. Try to keep your work and personal schedule in balance.  Do not use any products that contain nicotine or tobacco, such as cigarettes and e-cigarettes. If you need help quitting, ask your health care provider.  Do not use drugs. Contact a health care provider if:  Your fatigue does not get better.  You have a fever.  You suddenly lose or gain weight.  You have headaches.  You have trouble falling asleep or sleeping through the night.  You feel angry, guilty, anxious, or sad.  You are unable to have a bowel movement (constipation).  Your skin is dry.  You have swelling in your legs or another part of your body. Get help right away if:  You feel confused.  Your vision is blurry.  You feel faint or you pass out.  You have a severe headache.  You have severe pain in your abdomen, your back, or the area between your waist and hips (pelvis).  You have chest pain, shortness of breath, or an irregular or fast heartbeat.  You are unable to urinate, or you urinate less than normal.  You have abnormal bleeding, such as bleeding from the rectum, vagina, nose, lungs, or nipples.  You vomit blood.  You have thoughts about hurting yourself or others. If you ever feel like you may hurt yourself or others, or have thoughts about taking your own life, get help right away. You can go to  your nearest emergency department or call:  Your local emergency services (911 in the [place]  A suicide crisis helpline, such as the [facility] at (516)160-9801. This is open 24 hours a day. Summary  If you have fatigue, you feel tired all the time and have a lack of energy or a lack of motivation.  Fatigue may make it difficult to start or complete tasks because of exhaustion.  Long-lasting (chronic) or extreme fatigue may be a symptom of a medical condition.  Exercise regularly, as  told by your health care provider.  Change situations that cause you stress. Try to keep your work and personal schedule in balance. This information is not intended to replace advice given to you by your health care provider. Make sure you discuss any questions you have with your health care provider. Document Revised: 10/11/2018 Document Reviewed: 12/15/2016 Elsevier Patient Education  [employer].      If you have lab work done today you will be contacted with your lab results within the next 2 weeks.  If you have not heard from [place] then please contact us. The fastest way to get your results is to register for My Chart.   IF you received an x-ray today, you will receive an invoice from [facility]. Please contact [facility] at [phone] with questions or concerns regarding your invoice.   IF you received labwork today, you will receive an invoice from [facility]. Please contact [facility] at [phone] with questions or concerns regarding your invoice.   Our billing staff will not be able to assist you with questions regarding bills from these companies.  You will be contacted with the lab results as soon as they are available. The fastest way to get your results is to activate your My Chart account. Instructions are located on the last page of this paperwork. If you have not heard from [place] regarding the results in 2 weeks, please contact this office.

## 2019-04-09 NOTE — Telephone Encounter (Signed)
Requested medication (s) are due for refill today: no  Requested medication (s) are on the active medication list: yes  Last refill:  03/15/2019  Future visit scheduled: yes  Notes to clinic:  one inhaler should last one month Review for refill   Requested Prescriptions  Pending Prescriptions Disp Refills   albuterol (VENTOLIN HFA) 108 (90 Base) MCG/ACT inhaler [Pharmacy Med Name: ALBUTEROL HFA (VENTOLIN) INH]      Sig: Inhale 1-2 puffs into the lungs every 4 (four) hours as needed for wheezing or shortness of breath.      Pulmonology:  Beta Agonists Failed - 04/08/2019  9:12 AM      Failed - One inhaler should last at least one month. If the patient is requesting refills earlier, contact the patient to check for uncontrolled symptoms.      Passed - Valid encounter within last 12 months    Recent Outpatient Visits           3 weeks ago Essential hypertension   [facility], Ranell Patrick, MD   1 year ago Essential hypertension   [facility], [place], DO   5 years ago BPH (benign prostatic hypertrophy) with urinary obstruction   [place], Thomas L, MD   5 years ago Dementia, without behavioral disturbance   [place], Thomas L, MD   5 years ago Pneumonia, organism unspecified   [place], Thomas L, MD       Future Appointments             Today Wendie Agreste, MD [facility] [place], [facility]

## 2019-04-10 ENCOUNTER — Encounter: Payer: Self-pay | Admitting: Family Medicine

## 2019-04-10 LAB — CBC
Hematocrit: 44.8 % (ref 37.5–51.0)
Hemoglobin: 14.6 g/dL (ref 13.0–17.7)
MCH: 28.2 pg (ref 26.6–33.0)
MCHC: 32.6 g/dL (ref 31.5–35.7)
MCV: 87 fL (ref 79–97)
Platelets: 186 10*3/uL (ref 150–450)
RBC: 5.17 x10E6/uL (ref 4.14–5.80)
RDW: 13.6 % (ref 11.6–15.4)
WBC: 6.3 10*3/uL (ref 3.4–10.8)

## 2019-04-10 LAB — BASIC METABOLIC PANEL
BUN/Creatinine Ratio: 13 (ref 10–24)
BUN: 12 mg/dL (ref 8–27)
CO2: 27 mmol/L (ref 20–29)
Calcium: 9.1 mg/dL (ref 8.6–10.2)
Chloride: 104 mmol/L (ref 96–106)
Creatinine, Ser: 0.91 mg/dL (ref 0.76–1.27)
GFR calc Af Amer: 99 mL/min/{1.73_m2} (ref >59)
GFR calc non Af Amer: 86 mL/min/{1.73_m2} (ref >59)
Glucose: 128 mg/dL — ABNORMAL HIGH (ref 65–99)
Potassium: 3.9 mmol/L (ref 3.5–5.2)
Sodium: 141 mmol/L (ref 134–144)

## 2019-04-10 LAB — TSH: TSH: 1.21 u[IU]/mL (ref 0.450–4.500)

## 2019-04-10 NOTE — Telephone Encounter (Signed)
Pt states he does not need another inhaler and did not request this refill.

## 2019-04-11 NOTE — Telephone Encounter (Signed)
Patient would like another inhaler issue was not addressed at 04/09/2019 visit. Please advise on the refill.

## 2019-04-11 NOTE — Telephone Encounter (Signed)
Notes reviewed - he did not request  Refill. This had been ordered in December and infrequent use.

## 2019-04-12 ENCOUNTER — Ambulatory Visit: Payer: Federal, State, Local not specified - PPO | Admitting: Interventional Cardiology

## 2019-04-17 ENCOUNTER — Telehealth: Payer: Federal, State, Local not specified - PPO | Admitting: Cardiology

## 2019-04-20 DIAGNOSIS — S46011D Strain of muscle(s) and tendon(s) of the rotator cuff of right shoulder, subsequent encounter: Secondary | ICD-10-CM | POA: Diagnosis not present

## 2019-04-24 ENCOUNTER — Telehealth: Payer: Self-pay | Admitting: *Deleted

## 2019-04-24 DIAGNOSIS — G4733 Obstructive sleep apnea (adult) (pediatric): Secondary | ICD-10-CM

## 2019-04-24 NOTE — Telephone Encounter (Signed)
-----   Message from Lauralee Evener, [place] sent at 04/11/2019  8:22 AM EST ----- Ok to schedule BIPAP titration. Auth received from both [place] and [facility].   [facility] Auth [id number] Valid dates 03/28/19 to 06/26/19. BCBS [place] [id number]. Valid dates 01/05 21 to 10/07/19. ----- Message ----- From: Lauralee Evener, CMA Sent: 04/09/2019   2:38 PM EST To: Cv Div Sleep Studies  BIPAP titration

## 2019-04-24 NOTE — Telephone Encounter (Signed)
Patient is scheduled for lab study on 05/02/19. Pt is scheduled for COVID screening on 04/30/19 11:15 prior to titration.  Patient understands his sleep study will be done at [facility] sleep lab. Patient understands he will receive a sleep packet in a week or so. Patient understands to call if he does not receive the sleep packet in a timely manner. Patient agrees with treatment and thanked me for call.

## 2019-04-30 ENCOUNTER — Encounter: Payer: Self-pay | Admitting: Family Medicine

## 2019-04-30 ENCOUNTER — Other Ambulatory Visit (HOSPITAL_COMMUNITY)
Admission: RE | Admit: 2019-04-30 | Discharge: 2019-04-30 | Disposition: A | Discharge status: Home / Self Care | Payer: Federal, State, Local not specified - PPO | Source: Ambulatory Visit | Attending: Cardiology | Admitting: Cardiology

## 2019-04-30 ENCOUNTER — Other Ambulatory Visit: Payer: Self-pay

## 2019-04-30 ENCOUNTER — Ambulatory Visit (INDEPENDENT_AMBULATORY_CARE_PROVIDER_SITE_OTHER): Payer: Federal, State, Local not specified - PPO | Admitting: Family Medicine

## 2019-04-30 VITALS — BP 162/90 | HR 63 | Temp 97.6°F | Ht 70.0 in | Wt 256.8 lb

## 2019-04-30 DIAGNOSIS — Z20822 Contact with and (suspected) exposure to covid-19: Secondary | ICD-10-CM | POA: Insufficient documentation

## 2019-04-30 DIAGNOSIS — F329 Major depressive disorder, single episode, unspecified: Secondary | ICD-10-CM | POA: Diagnosis not present

## 2019-04-30 DIAGNOSIS — F32A Depression, unspecified: Secondary | ICD-10-CM

## 2019-04-30 DIAGNOSIS — G4733 Obstructive sleep apnea (adult) (pediatric): Secondary | ICD-10-CM | POA: Diagnosis not present

## 2019-04-30 DIAGNOSIS — Z01812 Encounter for preprocedural laboratory examination: Secondary | ICD-10-CM | POA: Diagnosis not present

## 2019-04-30 DIAGNOSIS — I10 Essential (primary) hypertension: Secondary | ICD-10-CM | POA: Diagnosis not present

## 2019-04-30 LAB — SARS CORONAVIRUS 2 (TAT 6-24 HRS): SARS Coronavirus 2: NEGATIVE

## 2019-04-30 NOTE — Progress Notes (Signed)
Subjective:  Patient ID: [name], male    DOB: Dec 26, 1950  Age: 69 y.o. MRN: [id number]  CC:  Chief Complaint  Patient presents with  . Follow-up    on depression and blood work. pt states he is doing much better. pt states he sleeps better, his focus seems to be back, and he isn't as fatigued at he use to be. no side effect from pt's medications.    HPI [name] presents for   Depression:  Likely contributing to fatigue January 4 visit.  Started on Zoloft 12.5 mg daily.  Less fatigue, improved focus. Less depressed.   Depression screen [facility] 2/9 04/30/2019 04/09/2019 03/15/2019  Decreased Interest 0 0 3  Down, Depressed, Hopeless 0 0 1  PHQ - 2 Score 0 0 4  Altered sleeping 1 - 3  Tired, decreased energy 2 - 3  Change in appetite 2 - 3  Feeling bad or failure about yourself  0 - 0  Trouble concentrating 1 - 0  Moving slowly or fidgety/restless 0 - 0  Suicidal thoughts 0 - 0  PHQ-9 Score 6 - 13  Difficult doing work/chores - - Not difficult at all  Some recent data might be hidden    Hypertension: Discussed last visit, not yet adherent to hydralazine that time, dosing was discussed at 3 times daily.  Still on losartan 100 mg daily, metoprolol 50 mg twice daily.  Hydrochlorothiazide 25 mg daily.  CBC, TSH reassuring last visit. Home readings: none.  Has been taking hydralazine tid, other meds above. No missed doses.  Sleep study planned in 2 days - cpap titration. Frequent wakening.   BP Readings from Last 3 Encounters:  04/30/19 (!) 162/90  04/09/19 (!) 170/86  03/22/19 140/80   Lab Results  Component Value Date   CREATININE 0.91 04/09/2019       History Patient Active Problem List   Diagnosis Date Noted  . Statin myopathy 04/04/2019  . Statin intolerance -weakness, stiffness 02/13/2019  . Rupture of right supraspinatus tendon 01/18/2019  . Complex tear of medial meniscus of left knee 09/14/2018  . Malignant neoplasm of prostate ([place]) 01/04/2018   . Refusal of blood transfusions as patient is Jehovah's Witness   . Pneumonia   . Osteoarthritis   . Obstructive sleep apnea   . Hyperlipidemia   . HTN (hypertension)   . GERD (gastroesophageal reflux disease)   . Depression   . Coronary artery disease   . Chronic lower back pain   . Chest pain   . Arthritis   . Anemia   . Benign essential HTN 01/03/2017  . PAT (paroxysmal atrial tachycardia) ([place]) 10/27/2015  . Type 2 diabetes mellitus with circulatory disorder, without long-term current use of insulin ([place]) 06/19/2015  . Angina pectoris ([place])   . Elevated PSA 12/20/2014  . CAD (coronary artery disease)   . Obstructive sleep apnea 09/06/2014  . Personal history of colonic polyps 12/13/2013  . Blood in stool 12/13/2013  . Prostatitis, acute 12/13/2013  . Insomnia 04/19/2013  . Obesity with body mass index of 30.0-39.9 10/18/2012  . Right lumbar radiculitis 09/29/2011  . Adenomatous colon polyp 04/05/2009  . ORGANIC IMPOTENCE 11/11/2008  . Hyperlipidemia LDL goal <70 08/23/2008  . GERD 08/23/2008  . BPH (benign prostatic hypertrophy) with urinary obstruction 08/23/2008  . OSTEOARTHRITIS 08/23/2008   Past Medical History:  Diagnosis Date  . Adenomatous colon polyp 2011  . Anemia   . Angina pectoris ([place]) 05/01/2015   med  rx for 95% OM (u/a to access due to tortuosity), 95% D1 and other moderate CAD at cath  . Arthritis    "back, knees" (05/01/2015)  . Chest pain    pleuritic  . Chronic lower back pain   . Complex tear of medial meniscus of left knee 09/14/2018  . Coronary artery disease   . Depression   . GERD (gastroesophageal reflux disease)   . HTN (hypertension)   . Hyperlipidemia   . Obstructive sleep apnea    noncompliant with CPAP  . Osteoarthritis   . Pneumonia ~ 2014 X 1  . Prostate cancer ([place])   . Refusal of blood transfusions as patient is Jehovah's Witness   . Rotator cuff arthropathy, right   . Rupture of right supraspinatus tendon 01/18/2019  .  Type II or unspecified type diabetes mellitus without mention of complication, not stated as uncontrolled    Past Surgical History:  Procedure Laterality Date  . CARDIAC CATHETERIZATION  2004   patent coronary arteries  . CARDIAC CATHETERIZATION  05/01/2015   "tried to put stent in but couldn't"  . CARDIAC CATHETERIZATION N/A 05/01/2015   Procedure: Left Heart Cath and Coronary Angiography;  Surgeon: Belva Crome, MD; LAD 60%, oD1 90%, pD1 70%, D2 70%, CFX patent stent, 30% distal to prev stent, pRCA 20%, OM1 90/95%, NL LV  . CARDIAC CATHETERIZATION N/A 05/01/2015   Procedure: Coronary Stent Intervention;  Surgeon: Belva Crome, MD;  Unsuccessful PCI OM due to tortuosity  . CARDIAC CATHETERIZATION N/A 11/06/2014   Procedure: Left Heart Cath and Coronary Angiography;  Surgeon: Belva Crome, MD;  Location: [facility];  Service: Cardiovascular;  Laterality: N/A;  . CLOSED REDUCTION SHOULDER DISLOCATION Right ~ 1975   "& reattached muscle"  . COLONOSCOPY W/ BIOPSIES AND POLYPECTOMY  X 2  . ESOPHAGOGASTRODUODENOSCOPY ENDOSCOPY    . KNEE ARTHROSCOPY WITH MEDIAL MENISECTOMY Left 09/14/2018   Procedure: LEFT KNEE ARTHROSCOPY CHONDROPLASY,  WITH MEDIAL MENISECTOMY;  Surgeon: Marchia Bond, MD;  Location: [facility];  Service: Orthopedics;  Laterality: Left;  . PILONIDAL CYST EXCISION    . SHOULDER ARTHROSCOPY WITH DEBRIDEMENT AND BICEP TENDON REPAIR Right 01/18/2019   Procedure: RIGHT SHOULDER ARTHROSCOPY WITH DEBRIDEMENT AND DECOMPRSSION SUBACROMIAL PARTIAL ACROMIOPLASTY;  Surgeon: Marchia Bond, MD;  Location: [facility];  Service: Orthopedics;  Laterality: Right;  . SHOULDER ARTHROSCOPY WITH ROTATOR CUFF REPAIR Right 01/18/2019   Procedure: SHOULDER ARTHROSCOPY WITH ROTATOR CUFF REPAIR;  Surgeon: Marchia Bond, MD;  Location: [facility];  Service: Orthopedics;  Laterality: Right;   Allergies  Allergen Reactions  . Statins Other (See Comments)     REACTION: joint pain Lipitor- headaches Has also tried Livalo, pravachol, zetia, crestor, welchol  . Imdur [Isosorbide Dinitrate]     Headache - "violent"   Prior to Admission medications   Medication Sig Start Date End Date Taking? Authorizing Provider  albuterol (VENTOLIN HFA) 108 (90 Base) MCG/ACT inhaler Inhale 1-2 puffs into the lungs every 4 (four) hours as needed for wheezing or shortness of breath. 03/15/19  Yes Wendie Agreste, MD  Blood Glucose Monitoring Suppl (RELION PREMIER BLU MONITOR) DEVI 1 Device by Does not apply route daily. 07/12/16  Yes [name], MD  fluticasone (FLONASE) 50 MCG/ACT nasal spray Place 1-2 sprays into both nostrils daily. 03/15/19  Yes Wendie Agreste, MD  glipiZIDE (GLUCOTROL XL) 5 MG 24 hr tablet TAKE 1 TABLET BY MOUTH TWICE A DAY 03/22/19  Yes [name],  MD  glucose blood test strip Use as instructed to check sugar 2 times daily 07/12/16  Yes [name], MD  hydrALAZINE (APRESOLINE) 25 MG tablet Take 1 tablet (25 mg total) by mouth 3 (three) times daily. 03/22/19  Yes Daune Perch, NP  hydrochlorothiazide (HYDRODIURIL) 25 MG tablet Take 1 tablet (25 mg total) by mouth daily. 05/05/18  Yes Belva Crome, MD  Insulin Detemir (LEVEMIR FLEXTOUCH) 100 UNIT/ML Pen INJECT 20 UNITS INTO THE SKIN DAILY AT 10 PM. 02/13/19  Yes [name], MD  Insulin Pen Needle (NOVOFINE PLUS) 32G X 4 MM MISC Use 1x a day 05/09/14  Yes [name], MD  losartan (COZAAR) 100 MG tablet Take 1 tablet (100 mg total) by mouth daily. 05/05/18  Yes Belva Crome, MD  metFORMIN (GLUCOPHAGE) 500 MG tablet TAKE 2 TABLETS (1,000 MG TOTAL) BY MOUTH 2 (TWO) TIMES DAILY WITH A MEAL. 10/17/18  Yes [name], MD  metoprolol tartrate (LOPRESSOR) 50 MG tablet Take 1 tablet (50 mg total) by mouth 2 (two) times daily. 04/18/18  Yes Belva Crome, MD  nitroGLYCERIN (NITROSTAT) 0.4 MG SL tablet PLACE ONE TABLET UNDER THE TONGUE EVERY 5 MINUTES X3 DOSES AS  NEEDED FOR CHEST PAIN 09/27/18  Yes Belva Crome, MD  sertraline (ZOLOFT) 25 MG tablet Take 0.5 tablets (12.5 mg total) by mouth daily. 04/09/19  Yes Wendie Agreste, MD  Evolocumab (REPATHA SURECLICK) [id number] MG/ML SOAJ Inject 1 pen into the skin every 14 (fourteen) days. Patient not taking: Reported on 04/30/2019 04/03/19   Belva Crome, MD   Social History   Socioeconomic History  . Marital status: Married    Spouse name: [name]  . Number of children: 4  . Years of education: Not on file  . Highest education level: Not on file  Occupational History  . Occupation: Retired    Fish farm manager: UNEMPLOYED  Tobacco Use  . Smoking status: Former Smoker    Packs/day: 0.50    Years: 10.00    Pack years: 5.00    Types: Cigarettes    Quit date: 04/06/1983    Years since quitting: 36.0  . Smokeless tobacco: Never Used  Substance and Sexual Activity  . Alcohol use: Yes    Alcohol/week: 0.0 standard drinks    Comment: `/26/2017 "might drink a beer q couple months mostly; summertime I might drink 2-3 beers/week"  . Drug use: No  . Sexual activity: Not Currently  Other Topics Concern  . Not on file  Social History Narrative   Lives in [place] with spouse Lorenso Ellman).  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   Social Determinants of Health   Financial Resource Strain:   . Difficulty of Paying Living Expenses: Not on file  Food Insecurity:   . Worried About Charity fundraiser in the Last Year: Not on file  . Ran Out of Food in the Last Year: Not on file  Transportation Needs: No Transportation Needs  . Lack of Transportation (Medical): No  . Lack of Transportation (Non-Medical): No  Physical Activity:   . Days of Exercise per Week: Not on file  . Minutes of Exercise per Session: Not on file  Stress:   . Feeling of Stress : Not on file  Social Connections:   . Frequency of Communication with Friends and Family: Not on file  .  Frequency of Social Gatherings with Friends and Family: Not on file  .  Attends Religious Services: Not on file  . Active Member of Clubs or Organizations: Not on file  . Attends Archivist Meetings: Not on file  . Marital Status: Not on file  Intimate Partner Violence: Not At Risk  . Fear of Current or Ex-Partner: No  . Emotionally Abused: No  . Physically Abused: No  . Sexually Abused: No    Review of Systems  Constitutional: Negative for fatigue (improved) and unexpected weight change.  Eyes: Negative for visual disturbance.  Respiratory: Negative for cough, chest tightness and shortness of breath.   Cardiovascular: Negative for chest pain, palpitations and leg swelling.  Gastrointestinal: Negative for abdominal pain and blood in stool.  Neurological: Negative for dizziness, light-headedness and headaches.     Objective:   Vitals:   04/30/19 1522 04/30/19 1531  BP: (!) 171/75 (!) 162/90  Pulse: 63   Temp: 97.6 F (36.4 C)   TempSrc: Temporal   SpO2: 97%   Weight: 256 lb 12.8 oz (116.5 kg)   Height: 5\' 10"  (1.778 m)     Physical Exam Vitals reviewed.  Constitutional:      Appearance: He is well-developed.  HENT:     Head: Normocephalic and atraumatic.  Eyes:     Pupils: Pupils are equal, round, and reactive to light.  Neck:     Vascular: No carotid bruit or JVD.  Cardiovascular:     Rate and Rhythm: Normal rate and regular rhythm.     Heart sounds: Normal heart sounds. No murmur.  Pulmonary:     Effort: Pulmonary effort is normal.     Breath sounds: Normal breath sounds. No rales.  Skin:    General: Skin is warm and dry.  Neurological:     Mental Status: He is alert and oriented to person, place, and time.        Assessment & Plan:  [name] is a 69 y.o. male . Depression, unspecified depression type  OSA (obstructive sleep apnea)  Essential hypertension  Improving continue as well as depression symptoms.  Tolerating Zoloft.   Blood pressure still slightly elevated, but has CPAP titration planned in 2 days, which I anticipate will improve blood pressure control.  Home monitoring with RTC precautions were discussed, no med changes for now, recheck 6 weeks.  No orders of the defined types were placed in this encounter.  Patient Instructions   Glad to hear you are doing better. cpap should help blood pressure control. Keep a record of your blood pressures outside of the office after you have started the cpap and follow up here or cardiology if remain over 140/90.   Return to the clinic or go to the nearest emergency room if any of your symptoms worsen or new symptoms occur.   If you have lab work done today you will be contacted with your lab results within the next 2 weeks.  If you have not heard from [place] then please contact us. The fastest way to get your results is to register for My Chart.   IF you received an x-ray today, you will receive an invoice from [facility]. Please contact [facility] Radiology at [phone] with questions or concerns regarding your invoice.   IF you received labwork today, you will receive an invoice from [facility]. Please contact [facility] at [phone] with questions or concerns regarding your invoice.   Our billing staff will not be able to assist you with questions regarding bills from these companies.  You will be contacted  with the lab results as soon as they are available. The fastest way to get your results is to activate your My Chart account. Instructions are located on the last page of this paperwork. If you have not heard from us regarding the results in 2 weeks, please contact this office.         Signed, Tasmia Blumer, MD [facility]  

## 2019-04-30 NOTE — Addendum Note (Signed)
Addended by: Freada Bergeron on: 04/30/2019 03:07 PM   Modules accepted: Orders

## 2019-04-30 NOTE — Patient Instructions (Addendum)
Glad to hear you are doing better. cpap should help blood pressure control. Keep a record of your blood pressures outside of the office after you have started the cpap and follow up here or cardiology if remain over 140/90.   Return to the clinic or go to the nearest emergency room if any of your symptoms worsen or new symptoms occur.   If you have lab work done today you will be contacted with your lab results within the next 2 weeks.  If you have not heard from [place] then please contact us. The fastest way to get your results is to register for My Chart.   IF you received an x-ray today, you will receive an invoice from [facility] Radiology. Please contact [facility] Radiology at [phone] with questions or concerns regarding your invoice.   IF you received labwork today, you will receive an invoice from [facility]. Please contact [facility] at [phone] with questions or concerns regarding your invoice.   Our billing staff will not be able to assist you with questions regarding bills from these companies.  You will be contacted with the lab results as soon as they are available. The fastest way to get your results is to activate your My Chart account. Instructions are located on the last page of this paperwork. If you have not heard from [place] regarding the results in 2 weeks, please contact this office.

## 2019-05-02 ENCOUNTER — Encounter (HOSPITAL_BASED_OUTPATIENT_CLINIC_OR_DEPARTMENT_OTHER): Payer: Federal, State, Local not specified - PPO | Admitting: Cardiology

## 2019-05-02 ENCOUNTER — Other Ambulatory Visit: Payer: Self-pay

## 2019-05-02 ENCOUNTER — Ambulatory Visit (HOSPITAL_BASED_OUTPATIENT_CLINIC_OR_DEPARTMENT_OTHER): Payer: Federal, State, Local not specified - PPO | Attending: Cardiology | Admitting: Cardiology

## 2019-05-02 DIAGNOSIS — G4733 Obstructive sleep apnea (adult) (pediatric): Secondary | ICD-10-CM | POA: Insufficient documentation

## 2019-05-02 DIAGNOSIS — I493 Ventricular premature depolarization: Secondary | ICD-10-CM | POA: Insufficient documentation

## 2019-05-03 ENCOUNTER — Other Ambulatory Visit (HOSPITAL_BASED_OUTPATIENT_CLINIC_OR_DEPARTMENT_OTHER): Payer: Self-pay

## 2019-05-03 DIAGNOSIS — G4733 Obstructive sleep apnea (adult) (pediatric): Secondary | ICD-10-CM

## 2019-05-03 NOTE — Procedures (Signed)
   Patient Name: [name], [name] Date: 05/02/2019 Gender: Male D.O.B: 08/07/1950 Age (years): 29 Referring Provider: Fransico Him MD, ABSM Height (inches): 70 Interpreting Physician: Fransico Him MD, ABSM Weight (lbs): 249 RPSGT: Zadie Rhine BMI: 36 MRN: [id number] Neck Size: 18.00  CLINICAL INFORMATION The patient is referred for a BiPAP titration to treat sleep apnea.  SLEEP STUDY TECHNIQUE As per the AASM Manual for the Scoring of Sleep and Associated Events v2.3 (April 2016) with a hypopnea requiring 4% desaturations.  The channels recorded and monitored were frontal, central and occipital EEG, electrooculogram (EOG), submentalis EMG (chin), nasal and oral airflow, thoracic and abdominal wall motion, anterior tibialis EMG, snore microphone, electrocardiogram, and pulse oximetry. Bilevel positive airway pressure (BPAP) was initiated at the beginning of the study and titrated to treat sleep-disordered breathing.  MEDICATIONS Medications self-administered by patient taken the night of the study : N/A  RESPIRATORY PARAMETERS Optimal IPAP Pressure (cm): 18  AHI at Optimal Pressure (/hr) 0.0 Optimal EPAP Pressure (cm):14  Overall Minimal O2 (%):86.0  Minimal O2 at Optimal Pressure (%): 96.0  SLEEP ARCHITECTURE Start Time:9:37:44 PM  Stop Time:3:46:44 AM  Total Time (min):369  Total Sleep Time (min):282.5 Sleep Latency (min):12.5  Sleep Efficiency (%):76.6%  REM Latency (min):74.0  WASO (min): 74.0 Stage N1 (%): 8.5%  Stage N2 (%): 77.9%  Stage N3 (%): 3.0%  Stage R (%):10.6 Supine (%):100.00  Arousal Index (/hr):39.5   CARDIAC DATA The 2 lead EKG demonstrated sinus rhythm. The mean heart rate was 52.6 beats per minute. Other EKG findings include: PVCs.  LEG MOVEMENT DATA The total Periodic Limb Movements of Sleep (PLMS) were 0. The PLMS index was 0.0. A PLMS index of <15 is considered normal in adults.  IMPRESSIONS - An optimal PAP pressure was selected for  this patient ( 18 / cm of water) - Mild Central Sleep Apnea was noted during this titration (CAI = 9.8/h). - Moderete oxygen desaturations were observed during this titration (min O2 = 86.0%). - No snoring was audible during this study. - 2-lead EKG demonstrated: PVCs - Clinically significant periodic limb movements were not noted during this study. Arousals associated with PLMs were rare.  DIAGNOSIS - Obstructive Sleep Apnea (327.23 [G47.33 ICD-10])  RECOMMENDATIONS - Trial of BiPAP therapy on 18/14 cm H2O with a Medium size Resmed Full Face Mask AirFit F20 mask and heated humidification. - Avoid alcohol, sedatives and other CNS depressants that may worsen sleep apnea and disrupt normal sleep architecture. - Sleep hygiene should be reviewed to assess factors that may improve sleep quality. - Weight management and regular exercise should be initiated or continued. - Return to Sleep Center for re-evaluation after 4 weeks of therapy  [Electronically signed] 05/03/2019 01:19 PM  Fransico Him MD, ABSM Diplomate, American Board of Sleep Medicine

## 2019-05-04 ENCOUNTER — Telehealth: Payer: Self-pay | Admitting: *Deleted

## 2019-05-04 DIAGNOSIS — I214 Non-ST elevation (NSTEMI) myocardial infarction: Secondary | ICD-10-CM | POA: Insufficient documentation

## 2019-05-04 NOTE — Telephone Encounter (Signed)
-----   Message from Sueanne Margarita, MD sent at 05/03/2019  1:22 PM EST ----- Please let patient know that they had a successful PAP titration and let DME know that orders are in EPIC.  Please set up 10 week OV with me.

## 2019-05-04 NOTE — Telephone Encounter (Signed)
Called results lmtcb. 

## 2019-05-07 NOTE — Telephone Encounter (Addendum)
Informed patient of sleep study results and patient understanding was verbalized. Patient understands his sleep study showed they had a successful PAP titration and let DME know that orders are in EPIC. Please set up 10 week OV with me.   Upon patient request DME selection is [facility]. Patient understands he will be contacted by [facility] to set up his cpap. Patient understands to call if [facility] does not contact him with new setup in a timely manner. Patient understands they will be called once confirmation has been received from [facility] that they have received their new machine to schedule 10 week follow up appointment.  [facility] notified of new cpap order  Please add to airview Patient was grateful for the call and thanked me.

## 2019-05-09 DIAGNOSIS — Z23 Encounter for immunization: Secondary | ICD-10-CM | POA: Diagnosis not present

## 2019-05-25 ENCOUNTER — Other Ambulatory Visit: Payer: Self-pay | Admitting: Interventional Cardiology

## 2019-05-29 ENCOUNTER — Other Ambulatory Visit: Payer: Self-pay | Admitting: Family Medicine

## 2019-05-29 NOTE — Telephone Encounter (Signed)
Requested medication (s) are due for refill today: yes  Requested medication (s) are on the active medication list: no  Last refill:  11/01/17  Future visit scheduled: yes  Notes to clinic:  previously d/c'd   Requested Prescriptions  Pending Prescriptions Disp Refills   pravastatin (PRAVACHOL) 40 MG tablet [Pharmacy Med Name: PRAVASTATIN SODIUM 40 MG TAB] 90 tablet 3    Sig: TAKE 1 TABLET ONCE DAILY      Cardiovascular:  Antilipid - Statins Failed - 05/29/2019  1:21 PM      Failed - LDL in normal range and within 360 days    LDL Calculated  Date Value Ref Range Status  09/25/2018 44 0 - 99 mg/dL Final   Direct LDL  Date Value Ref Range Status  12/18/2012 168.0 mg/dL Final    Comment:    Optimal:  <100 mg/dLNear or Above Optimal:  100-129 mg/dLBorderline High:  130-159 mg/dLHigh:  160-189 mg/dLVery High:  >190 mg/dL   LDL Direct  Date Value Ref Range Status  03/22/2019 131 (H) 0 - 99 mg/dL Final          Passed - Total Cholesterol in normal range and within 360 days    Cholesterol, Total  Date Value Ref Range Status  09/25/2018 111 100 - 199 mg/dL Final          Passed - HDL in normal range and within 360 days    HDL  Date Value Ref Range Status  09/25/2018 44 >39 mg/dL Final          Passed - Triglycerides in normal range and within 360 days    Triglycerides  Date Value Ref Range Status  09/25/2018 117 0 - 149 mg/dL Final          Passed - Patient is not pregnant      Passed - Valid encounter within last 12 months    Recent Outpatient Visits           4 weeks ago Depression, unspecified depression type   [facility], Ranell Patrick, MD   1 month ago Depression, unspecified depression type   [facility], Ranell Patrick, MD   2 months ago Essential hypertension   [facility], Ranell Patrick, MD       Future Appointments             In 1 week Wendie Agreste, MD Primary Care at [place], [place]

## 2019-06-05 DIAGNOSIS — G4733 Obstructive sleep apnea (adult) (pediatric): Secondary | ICD-10-CM | POA: Diagnosis not present

## 2019-06-06 DIAGNOSIS — Z23 Encounter for immunization: Secondary | ICD-10-CM | POA: Diagnosis not present

## 2019-06-11 ENCOUNTER — Ambulatory Visit (INDEPENDENT_AMBULATORY_CARE_PROVIDER_SITE_OTHER): Payer: Federal, State, Local not specified - PPO | Admitting: Family Medicine

## 2019-06-11 ENCOUNTER — Other Ambulatory Visit: Payer: Self-pay

## 2019-06-11 ENCOUNTER — Encounter: Payer: Self-pay | Admitting: Family Medicine

## 2019-06-11 VITALS — BP 134/80 | HR 75 | Temp 98.2°F | Ht 70.0 in | Wt 258.0 lb

## 2019-06-11 DIAGNOSIS — F329 Major depressive disorder, single episode, unspecified: Secondary | ICD-10-CM

## 2019-06-11 DIAGNOSIS — I10 Essential (primary) hypertension: Secondary | ICD-10-CM | POA: Diagnosis not present

## 2019-06-11 DIAGNOSIS — R5383 Other fatigue: Secondary | ICD-10-CM

## 2019-06-11 DIAGNOSIS — G4733 Obstructive sleep apnea (adult) (pediatric): Secondary | ICD-10-CM

## 2019-06-11 DIAGNOSIS — Z9989 Dependence on other enabling machines and devices: Secondary | ICD-10-CM

## 2019-06-11 DIAGNOSIS — F32A Depression, unspecified: Secondary | ICD-10-CM

## 2019-06-11 DIAGNOSIS — M159 Polyosteoarthritis, unspecified: Secondary | ICD-10-CM | POA: Diagnosis not present

## 2019-06-11 NOTE — Progress Notes (Signed)
Subjective:  Patient ID: [name], male    DOB: July 10, 1950  Age: 69 y.o. MRN: [id number]  CC:  Chief Complaint  Patient presents with  . Follow-up    on hypertension and fatigue. pt is still feeling fatigued but not as bad as last visit. pt reports not having any issues with his BP at home. pt seem to not experance any physical symptoms of hypertension.pt states medication is working well with no side effects.    HPI [name] presents for   Hypertension: Elevated at last visit in January but plan for CPAP titration at that time.  Continued same regimen with hydrochlorothiazide 25 mg daily, hydralazine 25 mg 3 times daily, losartan 100 mg daily. No on CPAP past 4-5 days. Feels more rested. Trying to get into more normal sleep cycle. nore refreshed during the day.   BP Readings from Last 3 Encounters:  06/11/19 134/80  04/30/19 (!) 162/90  04/09/19 (!) 170/86   Lab Results  Component Value Date   CREATININE 0.91 04/09/2019   Depression: Thought to have component of depression that was contributing to his fatigue in January.  Started on Zoloft 12.5 mg daily.  Improved symptoms at last visit. Fatigue is better. Feels more energy.  Not feeling depressed.  No new side effects with meds.  Some arthritis in knees and R>L shoulder, low back. Tylenol few times per week.   Depression screen [facility] 2/9 06/11/2019 04/30/2019 04/09/2019 03/15/2019  Decreased Interest 0 0 0 3  Down, Depressed, Hopeless 0 0 0 1  PHQ - 2 Score 0 0 0 4  Altered sleeping - 1 - 3  Tired, decreased energy - 2 - 3  Change in appetite - 2 - 3  Feeling bad or failure about yourself  - 0 - 0  Trouble concentrating - 1 - 0  Moving slowly or fidgety/restless - 0 - 0  Suicidal thoughts - 0 - 0  PHQ-9 Score - 6 - 13  Difficult doing work/chores - - - Not difficult at all  Some recent data might be hidden      History Patient Active Problem List   Diagnosis Date Noted  . Statin myopathy 04/04/2019  .  Statin intolerance -weakness, stiffness 02/13/2019  . Rupture of right supraspinatus tendon 01/18/2019  . Complex tear of medial meniscus of left knee 09/14/2018  . Malignant neoplasm of prostate ([place]) 01/04/2018  . Refusal of blood transfusions as patient is Jehovah's Witness   . Pneumonia   . Osteoarthritis   . Obstructive sleep apnea   . Hyperlipidemia   . HTN (hypertension)   . GERD (gastroesophageal reflux disease)   . Depression   . Coronary artery disease   . Chronic lower back pain   . Chest pain   . Arthritis   . Anemia   . Benign essential HTN 01/03/2017  . PAT (paroxysmal atrial tachycardia) ([place]) 10/27/2015  . Type 2 diabetes mellitus with circulatory disorder, without long-term current use of insulin ([place]) 06/19/2015  . Angina pectoris ([place])   . Elevated PSA 12/20/2014  . CAD (coronary artery disease)   . Obstructive sleep apnea 09/06/2014  . Personal history of colonic polyps 12/13/2013  . Blood in stool 12/13/2013  . Prostatitis, acute 12/13/2013  . Insomnia 04/19/2013  . Obesity with body mass index of 30.0-39.9 10/18/2012  . Right lumbar radiculitis 09/29/2011  . Adenomatous colon polyp 04/05/2009  . ORGANIC IMPOTENCE 11/11/2008  . Hyperlipidemia LDL goal <70 08/23/2008  .  GERD 08/23/2008  . BPH (benign prostatic hypertrophy) with urinary obstruction 08/23/2008  . OSTEOARTHRITIS 08/23/2008   Past Medical History:  Diagnosis Date  . Adenomatous colon polyp 2011  . Anemia   . Angina pectoris ([place]) 05/01/2015   med rx for 95% OM (u/a to access due to tortuosity), 95% D1 and other moderate CAD at cath  . Arthritis    "back, knees" (05/01/2015)  . Chest pain    pleuritic  . Chronic lower back pain   . Complex tear of medial meniscus of left knee 09/14/2018  . Coronary artery disease   . Depression   . GERD (gastroesophageal reflux disease)   . HTN (hypertension)   . Hyperlipidemia   . Obstructive sleep apnea    noncompliant with CPAP  . Osteoarthritis    . Pneumonia ~ 2014 X 1  . Prostate cancer ([place])   . Refusal of blood transfusions as patient is Jehovah's Witness   . Rotator cuff arthropathy, right   . Rupture of right supraspinatus tendon 01/18/2019  . Type II or unspecified type diabetes mellitus without mention of complication, not stated as uncontrolled    Past Surgical History:  Procedure Laterality Date  . CARDIAC CATHETERIZATION  2004   patent coronary arteries  . CARDIAC CATHETERIZATION  05/01/2015   "tried to put stent in but couldn't"  . CARDIAC CATHETERIZATION N/A 05/01/2015   Procedure: Left Heart Cath and Coronary Angiography;  Surgeon: Belva Crome, MD; LAD 60%, oD1 90%, pD1 70%, D2 70%, CFX patent stent, 30% distal to prev stent, pRCA 20%, OM1 90/95%, NL LV  . CARDIAC CATHETERIZATION N/A 05/01/2015   Procedure: Coronary Stent Intervention;  Surgeon: Belva Crome, MD;  Unsuccessful PCI OM due to tortuosity  . CARDIAC CATHETERIZATION N/A 11/06/2014   Procedure: Left Heart Cath and Coronary Angiography;  Surgeon: Belva Crome, MD;  Location: [place] [facility];  Service: Cardiovascular;  Laterality: N/A;  . CLOSED REDUCTION SHOULDER DISLOCATION Right ~ 1975   "& reattached muscle"  . COLONOSCOPY W/ BIOPSIES AND POLYPECTOMY  X 2  . ESOPHAGOGASTRODUODENOSCOPY ENDOSCOPY    . KNEE ARTHROSCOPY WITH MEDIAL MENISECTOMY Left 09/14/2018   Procedure: LEFT KNEE ARTHROSCOPY CHONDROPLASY,  WITH MEDIAL MENISECTOMY;  Surgeon: Marchia Bond, MD;  Location: [facility];  Service: Orthopedics;  Laterality: Left;  . PILONIDAL CYST EXCISION    . SHOULDER ARTHROSCOPY WITH DEBRIDEMENT AND BICEP TENDON REPAIR Right 01/18/2019   Procedure: RIGHT SHOULDER ARTHROSCOPY WITH DEBRIDEMENT AND DECOMPRSSION SUBACROMIAL PARTIAL ACROMIOPLASTY;  Surgeon: Marchia Bond, MD;  Location: [facility];  Service: Orthopedics;  Laterality: Right;  . SHOULDER ARTHROSCOPY WITH ROTATOR CUFF REPAIR Right 01/18/2019   Procedure: SHOULDER  ARTHROSCOPY WITH ROTATOR CUFF REPAIR;  Surgeon: Marchia Bond, MD;  Location: [facility];  Service: Orthopedics;  Laterality: Right;   Allergies  Allergen Reactions  . Statins Other (See Comments)    REACTION: joint pain Lipitor- headaches Has also tried Livalo, pravachol, zetia, crestor, welchol  . Imdur [Isosorbide Dinitrate]     Headache - "violent"   Prior to Admission medications   Medication Sig Start Date End Date Taking? Authorizing Provider  albuterol (VENTOLIN HFA) 108 (90 Base) MCG/ACT inhaler Inhale 1-2 puffs into the lungs every 4 (four) hours as needed for wheezing or shortness of breath. 03/15/19  Yes Wendie Agreste, MD  Blood Glucose Monitoring Suppl (RELION PREMIER BLU MONITOR) DEVI 1 Device by Does not apply route daily. 07/12/16  Yes [name], MD  Evolocumab (REPATHA SURECLICK) [id number] MG/ML SOAJ Inject 1 pen into the skin every 14 (fourteen) days. 04/03/19  Yes Belva Crome, MD  fluticasone [facility]) 50 MCG/ACT nasal spray Place 1-2 sprays into both nostrils daily. 03/15/19  Yes Wendie Agreste, MD  glipiZIDE (GLUCOTROL XL) 5 MG 24 hr tablet TAKE 1 TABLET BY MOUTH TWICE A DAY 03/22/19  Yes [name], MD  glucose blood test strip Use as instructed to check sugar 2 times daily 07/12/16  Yes [name], MD  hydrALAZINE (APRESOLINE) 25 MG tablet Take 1 tablet (25 mg total) by mouth 3 (three) times daily. 03/22/19  Yes Daune Perch, NP  hydrochlorothiazide (HYDRODIURIL) 25 MG tablet TAKE 1 TABLET BY MOUTH EVERY DAY 05/25/19  Yes Belva Crome, MD  Insulin Detemir (LEVEMIR FLEXTOUCH) 100 UNIT/ML Pen INJECT 20 UNITS INTO THE SKIN DAILY AT 10 PM. 02/13/19  Yes [name], MD  Insulin Pen Needle (NOVOFINE PLUS) 32G X 4 MM MISC Use 1x a day 05/09/14  Yes [name], MD  losartan (COZAAR) 100 MG tablet TAKE 1 TABLET BY MOUTH EVERY DAY 05/25/19  Yes Belva Crome, MD  metFORMIN (GLUCOPHAGE) 500 MG tablet TAKE 2 TABLETS (1,000 MG  TOTAL) BY MOUTH 2 (TWO) TIMES DAILY WITH A MEAL. 10/17/18  Yes [name], MD  metoprolol tartrate (LOPRESSOR) 50 MG tablet Take 1 tablet (50 mg total) by mouth 2 (two) times daily. 04/18/18  Yes Belva Crome, MD  nitroGLYCERIN (NITROSTAT) 0.4 MG SL tablet PLACE ONE TABLET UNDER THE TONGUE EVERY 5 MINUTES X3 DOSES AS NEEDED FOR CHEST PAIN 09/27/18  Yes Belva Crome, MD  pravastatin (PRAVACHOL) 40 MG tablet TAKE 1 TABLET ONCE DAILY 05/30/19  Yes Wendie Agreste, MD  sertraline (ZOLOFT) 25 MG tablet Take 0.5 tablets (12.5 mg total) by mouth daily. 04/09/19  Yes Wendie Agreste, MD   Social History   Socioeconomic History  . Marital status: Married    Spouse name: [name]  . Number of children: 4  . Years of education: Not on file  . Highest education level: Not on file  Occupational History  . Occupation: Retired    Fish farm manager: UNEMPLOYED  Tobacco Use  . Smoking status: Former Smoker    Packs/day: 0.50    Years: 10.00    Pack years: 5.00    Types: Cigarettes    Quit date: 04/06/1983    Years since quitting: 36.2  . Smokeless tobacco: Never Used  Substance and Sexual Activity  . Alcohol use: Yes    Alcohol/week: 0.0 standard drinks    Comment: `/26/2017 "might drink a beer q couple months mostly; summertime I might drink 2-3 beers/week"  . Drug use: No  . Sexual activity: Not Currently  Other Topics Concern  . Not on file  Social History Narrative   Lives in [place] with spouse Taidyn Boesch).  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   Social Determinants of Health   Financial Resource Strain:   . Difficulty of Paying Living Expenses: Not on file  Food Insecurity:   . Worried About Charity fundraiser in the Last Year: Not on file  . Ran Out of Food in the Last Year: Not on file  Transportation Needs:   . Lack of Transportation (Medical): Not on file  . Lack of Transportation (Non-Medical): Not on file  Physical  Activity:   . Days of Exercise per Week: Not on file  .  Minutes of Exercise per Session: Not on file  Stress:   . Feeling of Stress : Not on file  Social Connections:   . Frequency of Communication with Friends and Family: Not on file  . Frequency of Social Gatherings with Friends and Family: Not on file  . Attends Religious Services: Not on file  . Active Member of Clubs or Organizations: Not on file  . Attends Archivist Meetings: Not on file  . Marital Status: Not on file  Intimate Partner Violence:   . Fear of Current or Ex-Partner: Not on file  . Emotionally Abused: Not on file  . Physically Abused: Not on file  . Sexually Abused: Not on file    Review of Systems  Constitutional: Negative for fatigue and unexpected weight change.  Eyes: Negative for visual disturbance.  Respiratory: Negative for cough, chest tightness and shortness of breath.   Cardiovascular: Negative for chest pain, palpitations and leg swelling.  Gastrointestinal: Negative for abdominal pain and blood in stool.  Neurological: Negative for dizziness, light-headedness and headaches.     Objective:   Vitals:   06/11/19 1429  BP: 134/80  Pulse: 75  Temp: 98.2 F (36.8 C)  TempSrc: Temporal  SpO2: 97%  Weight: 258 lb (117 kg)  Height: 5\' 10"  (1.778 m)   Physical Exam Vitals reviewed.  Constitutional:      Appearance: He is well-developed.  HENT:     Head: Normocephalic and atraumatic.  Eyes:     Pupils: Pupils are equal, round, and reactive to light.  Neck:     Vascular: No carotid bruit or JVD.  Cardiovascular:     Rate and Rhythm: Normal rate and regular rhythm.     Heart sounds: Normal heart sounds. No murmur.  Pulmonary:     Effort: Pulmonary effort is normal.     Breath sounds: Normal breath sounds. No rales.  Skin:    General: Skin is warm and dry.  Neurological:     Mental Status: He is alert and oriented to person, place, and time.       Assessment & Plan:    [name] is a 69 y.o. male . Essential hypertension  -Significant improvement on CPAP.  Continue same regimen.  Recheck 4 months.  Osteoarthritis of multiple joints, unspecified osteoarthritis type  -Multiple sites reported, no specific changes today.  Variance of Tylenol.  Option of increased frequency of Tylenol, over-the-counter glucosamine/chondroitin as option with RTC precautions if 1 specific area worse or not improving.  Discussed NSAIDs but overall avoidance recommended.  Depression, unspecified depression type Fatigue, unspecified type  -Improved, continue Zoloft same dose.  Continue also likely related obstructive sleep apnea which is now under improved control.  OSA on CPAP  -As above, continue CPAP, working towards regular sleep schedule.  No orders of the defined types were placed in this encounter.  Patient Instructions    Blood pressure looks better today on the CPAP.  No change in blood pressure medicines at this time.  Also okay to continue Zoloft at this time for depression.  Recheck with me in 4 months.  Okay to use Tylenol over-the-counter up to few times per day if needed for arthritis.  Over-the-counter glucosamine is an herbal supplement that sometimes can help as well.  If there is one specific area that is hurting worse than others, or new symptoms, return for recheck for that area.  Let me know if there are questions.   If you have lab  work done today you will be contacted with your lab results within the next 2 weeks.  If you have not heard from [place] then please contact us. The fastest way to get your results is to register for My Chart.   IF you received an x-ray today, you will receive an invoice from [facility]. Please contact [facility] at [phone] with questions or concerns regarding your invoice.   IF you received labwork today, you will receive an invoice from [place]. Please contact [facility] at [phone] with questions  or concerns regarding your invoice.   Our billing staff will not be able to assist you with questions regarding bills from these companies.  You will be contacted with the lab results as soon as they are available. The fastest way to get your results is to activate your My Chart account. Instructions are located on the last page of this paperwork. If you have not heard from [place] regarding the results in 2 weeks, please contact this office.         Signed, [name], MD [facility]

## 2019-06-11 NOTE — Patient Instructions (Addendum)
  Blood pressure looks better today on the CPAP.  No change in blood pressure medicines at this time.  Also okay to continue Zoloft at this time for depression.  Recheck with me in 4 months.  Okay to use Tylenol over-the-counter up to few times per day if needed for arthritis.  Over-the-counter glucosamine is an herbal supplement that sometimes can help as well.  If there is one specific area that is hurting worse than others, or new symptoms, return for recheck for that area.  Let me know if there are questions.   If you have lab work done today you will be contacted with your lab results within the next 2 weeks.  If you have not heard from [place] then please contact us. The fastest way to get your results is to register for My Chart.   IF you received an x-ray today, you will receive an invoice from [facility]. Please contact [facility] at [phone] with questions or concerns regarding your invoice.   IF you received labwork today, you will receive an invoice from [facility]. Please contact [facility] at [phone] with questions or concerns regarding your invoice.   Our billing staff will not be able to assist you with questions regarding bills from these companies.  You will be contacted with the lab results as soon as they are available. The fastest way to get your results is to activate your My Chart account. Instructions are located on the last page of this paperwork. If you have not heard from [place] regarding the results in 2 weeks, please contact this office.

## 2019-06-15 ENCOUNTER — Ambulatory Visit: Payer: Federal, State, Local not specified - PPO | Admitting: Internal Medicine

## 2019-07-06 DIAGNOSIS — G4733 Obstructive sleep apnea (adult) (pediatric): Secondary | ICD-10-CM | POA: Diagnosis not present

## 2019-07-31 ENCOUNTER — Other Ambulatory Visit: Payer: Self-pay | Admitting: Internal Medicine

## 2019-08-02 ENCOUNTER — Other Ambulatory Visit: Payer: Self-pay | Admitting: Interventional Cardiology

## 2019-08-05 DIAGNOSIS — G4733 Obstructive sleep apnea (adult) (pediatric): Secondary | ICD-10-CM | POA: Diagnosis not present

## 2019-08-07 ENCOUNTER — Ambulatory Visit: Payer: Federal, State, Local not specified - PPO | Admitting: Internal Medicine

## 2019-08-13 ENCOUNTER — Ambulatory Visit (INDEPENDENT_AMBULATORY_CARE_PROVIDER_SITE_OTHER): Payer: Federal, State, Local not specified - PPO | Admitting: Internal Medicine

## 2019-08-13 ENCOUNTER — Other Ambulatory Visit: Payer: Self-pay

## 2019-08-13 ENCOUNTER — Encounter: Payer: Self-pay | Admitting: Internal Medicine

## 2019-08-13 VITALS — BP 148/80 | HR 80 | Ht 70.0 in | Wt 257.0 lb

## 2019-08-13 DIAGNOSIS — E785 Hyperlipidemia, unspecified: Secondary | ICD-10-CM

## 2019-08-13 DIAGNOSIS — E669 Obesity, unspecified: Secondary | ICD-10-CM | POA: Diagnosis not present

## 2019-08-13 DIAGNOSIS — E1159 Type 2 diabetes mellitus with other circulatory complications: Secondary | ICD-10-CM

## 2019-08-13 LAB — POCT GLYCOSYLATED HEMOGLOBIN (HGB A1C): Hemoglobin A1C: 7.9 % — AB (ref 4.0–5.6)

## 2019-08-13 NOTE — Patient Instructions (Signed)
Please continue: - Metformin 1000 mg 2x a day - Glipizide XL 5 mg 2x a day - break/crush the evening dose - Levemir 20 units at bedtime  Please start Ozempic 0.25 mg weekly in a.m. (for example on Sunday morning) x 2 weeks, then increase to 0.5 mg weekly in a.m. if no nausea or hypoglycemia.  Please come back for a follow-up appointment in 3-4 months.

## 2019-08-13 NOTE — Addendum Note (Signed)
Addended by: Cardell Peach I on: 08/13/2019 03:33 PM   Modules accepted: Orders

## 2019-08-13 NOTE — Progress Notes (Signed)
Patient ID: [name], male   DOB: [birth year], 69 y.o.   MRN: [id number]  This visit occurred during the SARS-CoV-2 public health emergency.  Safety protocols were in place, including screening questions prior to the visit, additional usage of staff PPE, and extensive cleaning of exam room while observing appropriate contact time as indicated for disinfecting solutions.   HPI: [name] is a 69 y.o.-year-old male, returning for f/u for DM2, dx 2007, prev. insulin-dependent since 2014, uncontrolled, with complications (CAD - s/p NSTEMI 11/2014 - stent; ED). Last visit 6 months ago. PCP: Dr Odette Fraction.  At last visit, he was very inactive, sleeping for 5 hours in the afternoon and then being up the entire night and snacking.  Sugars are higher.  They are still high, especially in the evening, since he is not taking Ozempic as recommended last visit  Reviewed HbA1c levels: Lab Results  Component Value Date   HGBA1C 8.1 (A) 02/13/2019   HGBA1C 7.7 (A) 10/05/2018   HGBA1C 7.0 (A) 02/06/2018   Pt is on a regimen of: - Metformin 1000 mg 2x a day - Glipizide XL 5 mg 2x a day - break/crush the evening dose - Levemir 20 units at bedtime - Ozempic 0.5 mg weekly >> recommended 10/2018 >> finally started 02/2019 - took 4 doses and stopped for fear of SEs   He tried Toujeo 20 units at night but did not like it He did not start Januvia as suggested at last visit ("I don't think I need it"). He was Levemir 140 units in am >> decreased to 70 units>> stopped as this was too expensive for him He stopped Welchol 3.7g at bedtime a day. Stopped Farxiga 10 mg b/c frequent urination and urgency. He was on Glipizide in the past. He was on Metformin in the past >> no N/V.   Pt checks his sugars once a day: - am: 105, 147-157 >> 150-180 (eating during the night) >> 150 >> 130-140 - 2h after b'fast: n/c >> 128, 134 >> n/c - before lunch:   206 >> n/c >> 140s >> n/c - 2h after lunch: n/c >> 140-157 >>  n/c - before dinner:  106-120 >> n/c >> 80-130 >> n/c - 2h after dinner: 200-245 >> 150-180, 200 >> n/c - bedtime:  180-200 >> 180-200 >> 180-200 >> 230-240 - nighttime:94, 190-305 >> 148-216 >> n/c  Lowest sugar was 80 >> 105 >> 140 >> 130 >> 130; he has hypoglycemia awareness in the 80s. Highest sugar was 220 >> 250 >> 200 >> 240.  -+ Mild CKD, last BUN/creatinine:  Lab Results  Component Value Date   BUN 12 04/09/2019   CREATININE 0.91 04/09/2019  09/07/2016: 13/0.9, glucose 133 On losartan. -+ HL; last set of lipids: Lab Results  Component Value Date   CHOL 111 09/25/2018   HDL 44 09/25/2018   LDLCALC 44 09/25/2018   LDLDIRECT 131 (H) 03/22/2019   TRIG 117 09/25/2018   CHOLHDL 2.5 09/25/2018  09/07/2016: 167/65/33/121 He was on pravastatin but could not tolerate it due to muscle aches and weakness.  He stopped Repatha 2/2 mm weakness (?). - last eye exam was in 2020: No DR; Dr. Lady Gary. - no numbness and tingling in his feet.  Pt was admitted for CP in 11/2014: NSTEMI >> CAD >> PTCA >> stent placed.  He was in ED with CP 05/2017 >> no cardiac pathology. He had shoulder surgery 01/18/2019 after rupture of his supraspinatus tendon. In 2020, he had  radiotherapy for his prostate cancer (with gold beads).    ROS: Constitutional: no weight gain/no weight loss, no fatigue, no subjective hyperthermia, no subjective hypothermia, + increased urination Eyes: no blurry vision, no xerophthalmia ENT: no sore throat, no nodules palpated in neck, no dysphagia, no odynophagia, no hoarseness Cardiovascular: no CP/no SOB/no palpitations/no leg swelling Respiratory: no cough/no SOB/no wheezing Gastrointestinal: no N/no V/no D/no C/no acid reflux Musculoskeletal: no muscle aches/+ joint aches (right shoulder). Skin: no rashes, no hair loss Neurological: no tremors/no numbness/no tingling/no dizziness  I reviewed pt's medications, allergies, PMH, social hx, family hx, and changes were  documented in the history of present illness. Otherwise, unchanged from my initial visit note.  Past Medical History:  Diagnosis Date  . Adenomatous colon polyp 2011  . Anemia   . Angina pectoris ([place]) 05/01/2015   med rx for 95% OM (u/a to access due to tortuosity), 95% D1 and other moderate CAD at cath  . Arthritis    "back, knees" (05/01/2015)  . Chest pain    pleuritic  . Chronic lower back pain   . Complex tear of medial meniscus of left knee 09/14/2018  . Coronary artery disease   . Depression   . GERD (gastroesophageal reflux disease)   . HTN (hypertension)   . Hyperlipidemia   . Obstructive sleep apnea    noncompliant with CPAP  . Osteoarthritis   . Pneumonia ~ 2014 X 1  . Prostate cancer ([facility])   . Refusal of blood transfusions as patient is Jehovah's Witness   . Rotator cuff arthropathy, right   . Rupture of right supraspinatus tendon 01/18/2019  . Type II or unspecified type diabetes mellitus without mention of complication, not stated as uncontrolled    Past Surgical History:  Procedure Laterality Date  . CARDIAC CATHETERIZATION  2004   patent coronary arteries  . CARDIAC CATHETERIZATION  05/01/2015   "tried to put stent in but couldn't"  . CARDIAC CATHETERIZATION N/A 05/01/2015   Procedure: Left Heart Cath and Coronary Angiography;  Surgeon: Belva Crome, MD; LAD 60%, oD1 90%, pD1 70%, D2 70%, CFX patent stent, 30% distal to prev stent, pRCA 20%, OM1 90/95%, NL LV  . CARDIAC CATHETERIZATION N/A 05/01/2015   Procedure: Coronary Stent Intervention;  Surgeon: Belva Crome, MD;  Unsuccessful PCI OM due to tortuosity  . CARDIAC CATHETERIZATION N/A 11/06/2014   Procedure: Left Heart Cath and Coronary Angiography;  Surgeon: Belva Crome, MD;  Location: [facility];  Service: Cardiovascular;  Laterality: N/A;  . CLOSED REDUCTION SHOULDER DISLOCATION Right ~ 1975   "& reattached muscle"  . COLONOSCOPY W/ BIOPSIES AND POLYPECTOMY  X 2  . ESOPHAGOGASTRODUODENOSCOPY  ENDOSCOPY    . KNEE ARTHROSCOPY WITH MEDIAL MENISECTOMY Left 09/14/2018   Procedure: LEFT KNEE ARTHROSCOPY CHONDROPLASY,  WITH MEDIAL MENISECTOMY;  Surgeon: Marchia Bond, MD;  Location: [facility];  Service: Orthopedics;  Laterality: Left;  . PILONIDAL CYST EXCISION    . SHOULDER ARTHROSCOPY WITH DEBRIDEMENT AND BICEP TENDON REPAIR Right 01/18/2019   Procedure: RIGHT SHOULDER ARTHROSCOPY WITH DEBRIDEMENT AND DECOMPRSSION SUBACROMIAL PARTIAL ACROMIOPLASTY;  Surgeon: Marchia Bond, MD;  Location: [facility];  Service: Orthopedics;  Laterality: Right;  . SHOULDER ARTHROSCOPY WITH ROTATOR CUFF REPAIR Right 01/18/2019   Procedure: SHOULDER ARTHROSCOPY WITH ROTATOR CUFF REPAIR;  Surgeon: Marchia Bond, MD;  Location: [facility];  Service: Orthopedics;  Laterality: Right;   Social History   Socioeconomic History  . Marital status: Married  Spouse name: [name]  . Number of children: 4  . Years of education: Not on file  . Highest education level: Not on file  Occupational History  . Occupation: Retired    Fish farm manager: UNEMPLOYED  Tobacco Use  . Smoking status: Former Smoker    Packs/day: 0.50    Years: 10.00    Pack years: 5.00    Types: Cigarettes    Quit date: 04/06/1983    Years since quitting: 36.3  . Smokeless tobacco: Never Used  Substance and Sexual Activity  . Alcohol use: Yes    Alcohol/week: 0.0 standard drinks    Comment: `/26/2017 "might drink a beer q couple months mostly; summertime I might drink 2-3 beers/week"  . Drug use: No  . Sexual activity: Not Currently  Other Topics Concern  . Not on file  Social History Narrative   Lives in [place] with spouse Taisei Syzdek).  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   Social Determinants of Health   Financial Resource Strain:   . Difficulty of Paying Living Expenses:   Food Insecurity:   . Worried About Charity fundraiser  in the Last Year:   . Arboriculturist in the Last Year:   Transportation Needs:   . Film/video editor (Medical):   Marland Kitchen Lack of Transportation (Non-Medical):   Physical Activity:   . Days of Exercise per Week:   . Minutes of Exercise per Session:   Stress:   . Feeling of Stress :   Social Connections:   . Frequency of Communication with Friends and Family:   . Frequency of Social Gatherings with Friends and Family:   . Attends Religious Services:   . Active Member of Clubs or Organizations:   . Attends Archivist Meetings:   Marland Kitchen Marital Status:   Intimate Partner Violence:   . Fear of Current or Ex-Partner:   . Emotionally Abused:   Marland Kitchen Physically Abused:   . Sexually Abused:    Current Outpatient Medications on File Prior to Visit  Medication Sig Dispense Refill  . albuterol (VENTOLIN HFA) 108 (90 Base) MCG/ACT inhaler Inhale 1-2 puffs into the lungs every 4 (four) hours as needed for wheezing or shortness of breath. 18 g 0  . Blood Glucose Monitoring Suppl (RELION PREMIER BLU MONITOR) DEVI 1 Device by Does not apply route daily. 1 Device 0  . Evolocumab (REPATHA SURECLICK) [id number] MG/ML SOAJ Inject 1 pen into the skin every 14 (fourteen) days. 2 pen 11  . fluticasone (FLONASE) 50 MCG/ACT nasal spray Place 1-2 sprays into both nostrils daily. 16 g 6  . glipiZIDE (GLUCOTROL XL) 5 MG 24 hr tablet TAKE 1 TABLET BY MOUTH TWICE A DAY 180 tablet 1  . glucose blood test strip Use as instructed to check sugar 2 times daily 200 each 5  . hydrALAZINE (APRESOLINE) 25 MG tablet Take 1 tablet (25 mg total) by mouth 3 (three) times daily. 270 tablet 3  . hydrochlorothiazide (HYDRODIURIL) 25 MG tablet TAKE 1 TABLET BY MOUTH EVERY DAY 90 tablet 3  . Insulin Detemir (LEVEMIR FLEXTOUCH) 100 UNIT/ML Pen INJECT 20 UNITS INTO THE SKIN DAILY AT 10 PM. 15 mL 3  . Insulin Pen Needle (NOVOFINE PLUS) 32G X 4 MM MISC Use 1x a day 100 each 11  . losartan (COZAAR) 100 MG tablet TAKE 1 TABLET BY MOUTH  EVERY DAY 90 tablet 3  . metFORMIN (GLUCOPHAGE) 500  MG tablet TAKE 2 TABLETS (1,000 MG TOTAL) BY MOUTH 2 (TWO) TIMES DAILY WITH A MEAL. 360 tablet 0  . metoprolol tartrate (LOPRESSOR) 50 MG tablet Take 1 tablet (50 mg total) by mouth 2 (two) times daily. 180 tablet 3  . nitroGLYCERIN (NITROSTAT) 0.4 MG SL tablet PLACE 1 TABLET UNDER THE TONGUE EVERY 5 MIN X 3 DOSES AS NEEDED FOR CHEST PAIN 75 tablet 2  . pravastatin (PRAVACHOL) 40 MG tablet TAKE 1 TABLET ONCE DAILY 90 tablet 3  . sertraline (ZOLOFT) 25 MG tablet Take 0.5 tablets (12.5 mg total) by mouth daily. 45 tablet 1   No current facility-administered medications on file prior to visit.   Allergies  Allergen Reactions  . Statins Other (See Comments)    REACTION: joint pain Lipitor- headaches Has also tried Livalo, pravachol, zetia, crestor, welchol  . Imdur [Isosorbide Dinitrate]     Headache - "violent"   Family History  Problem Relation Age of Onset  . Coronary artery disease Other        CABG  . Coronary artery disease Mother   . Stroke Mother   . Heart disease Mother   . Diabetes Mother   . Other Father   . Diabetes Sister   . Diabetes Maternal Uncle        x2  . Alcohol abuse Other   . Diabetes Paternal Uncle        x2  . Colon cancer Paternal Uncle   . Colon polyps Neg Hx   . Esophageal cancer Neg Hx     PE: BP (!) 148/80   Pulse 80   Ht 5\' 10"  (1.778 m)   Wt 257 lb (116.6 kg)   SpO2 96%   BMI 36.88 kg/m   Wt Readings from Last 3 Encounters:  08/13/19 257 lb (116.6 kg)  06/11/19 258 lb (117 kg)  05/02/19 249 lb (112.9 kg)   Constitutional: overweight, in NAD Eyes: PERRLA, EOMI, no exophthalmos ENT: moist mucous membranes, no thyromegaly, no cervical lymphadenopathy Cardiovascular: RRR, No MRG Respiratory: CTA B Gastrointestinal: abdomen soft, NT, ND, BS+ Musculoskeletal: no deformities, strength intact in all 4 Skin: moist, warm, no rashes Neurological: no tremor with outstretched hands, DTR  normal in all 4  ASSESSMENT: 1. DM2, insulin-dependent, uncontrolled, with complications - CAD, s/p NSTEMI, s/p stent 11/2014 - ED  2. Obesity class II BMI Classification:  < 18.5 underweight   18.5-24.9 normal weight   25.0-29.9 overweight   30.0-34.9 class I obesity   35.0-39.9 class II obesity   ? 40.0 class III obesity   3. HL  PLAN:  1. Patient with worsening diabetes control at last visit despite a regimen including oral medications (Metformin, sulfonylurea, also long-acting insulin), to which we added a weekly GLP-1 receptor agonist in 02/2019.  At that time, he returned after period of inactivity, in which she was sleeping during the day and staying up at night snacking frequently.  He was also forgetting his glipizide.  I advised him to restart taking glipizide consistently and I advised him to start Ozempic.  We also discussed about trying to reverse his circadian rhythm so that he goes to bed at night instead of during the day. -At this visit, he is still staying up late and his sugars are higher at bedtime, even higher than before.  He does admit that he started Ozempic and he only took it for approximately 1 month, after which she stopped it as he was not sure whether it would  take it along with Repatha (?).  Unfortunately, he did not call me to clarify.  At this visit, I advised him that there are 2 different medication classes and do not interfere with each other.  Since sugars continue to increase, I again advised him to start Ozempic (now for the third time) in an effort to avoid the need to start mealtime insulin. - I suggested to:  Patient Instructions  Please continue: - Metformin 1000 mg 2x a day - Glipizide XL 5 mg 2x a day - break/crush the evening dose - Levemir 20 units at bedtime  Please start Ozempic 0.25 mg weekly in a.m. (for example on Sunday morning) x 2 weeks, then increase to 0.5 mg weekly in a.m. if no nausea or hypoglycemia.  Please come back for  a follow-up appointment in 3-4 months.  - we checked his HbA1c: 7.9% (slightly lower) - advised to check sugars at different times of the day - 1-2x a day, rotating check times - advised for yearly eye exams >> he is UTD - return to clinic in 3-4 months  2. Obesity -will start Ozempic, which should also help with weight loss -no wt loss wince last visit  3. HL -Reviewed latest lipid panel from 03/2019: LDL increased, now above target, the rest of the fractions at goal: Lab Results  Component Value Date   CHOL 111 09/25/2018   HDL 44 09/25/2018   LDLCALC 44 09/25/2018   LDLDIRECT 131 (H) 03/22/2019   TRIG 117 09/25/2018   CHOLHDL 2.5 09/25/2018  -He was started on Repatha but he stopped as he experienced muscle weakness -He had side effects to statins: Stiffness and muscle weakness  [name], MD PhD [facility] End[facility]

## 2019-08-29 ENCOUNTER — Ambulatory Visit (INDEPENDENT_AMBULATORY_CARE_PROVIDER_SITE_OTHER): Payer: Federal, State, Local not specified - PPO | Admitting: Physician Assistant

## 2019-08-29 ENCOUNTER — Telehealth: Payer: Self-pay | Admitting: Physician Assistant

## 2019-08-29 ENCOUNTER — Encounter: Payer: Self-pay | Admitting: Physician Assistant

## 2019-08-29 ENCOUNTER — Other Ambulatory Visit: Payer: Self-pay

## 2019-08-29 VITALS — BP 148/84 | HR 81 | Ht 70.0 in | Wt 257.1 lb

## 2019-08-29 DIAGNOSIS — I2510 Atherosclerotic heart disease of native coronary artery without angina pectoris: Secondary | ICD-10-CM

## 2019-08-29 DIAGNOSIS — I10 Essential (primary) hypertension: Secondary | ICD-10-CM | POA: Diagnosis not present

## 2019-08-29 DIAGNOSIS — E785 Hyperlipidemia, unspecified: Secondary | ICD-10-CM

## 2019-08-29 MED ORDER — HYDRALAZINE HCL 25 MG PO TABS: 25.0000 mg | ORAL_TABLET | Freq: Two times a day (BID) | ORAL | 1 refills | Status: DC

## 2019-08-29 NOTE — Progress Notes (Signed)
Cardiology Office Note:    Date:  08/29/2019   ID:  [name], DOB 08-May-1950, MRN [id number]  PCP:  Ronald Agreste, MD  Cardiologist:  Ronald Grooms, MD   Electrophysiologist:  None   Referring MD: Ronald Agreste, MD   Chief Complaint:  Follow-up (CAD, HTN, Hyperlipidemia)    Patient Profile:    [name] is a 69 y.o. male with:   Coronary artery disease   S/p DES to LCx, POBA to OM1 in 11/2014  Cath in 1/17: LCx stent ok, OM1 95 >> PCI: unsuccessful PCI of OM1  Echocardiogram 12/17: EF 55-60, Gr 1 DD  Paroxysmal atrial fibrillation   Holter 8/17: low burden; seen by Dr. Rayann Brock >> no need for anticoagulation   Diabetes mellitus   Hypertension   Hyperlipidemia   OSA   Prostate CA, s/p XRT  Prior CV studies: Echocardiogram 03/08/16 Mild LVH, Gr 1 DD, EF 55-60, Ao root 41 mm (mild dilation), no RMWA, mod LAE, MAC  Holter 8/17 Normal sinus rhythm, brief atrial fibrillation, rare PVCs  Cardiac catheterization 05/01/2015 LAD mid 60; D1 ost 90, 70; D2 75 LCx mid stent patent, dist 30; OM1 95 RCA prox 25  Myoview 10/08/14 Inf defect - diaph atten vs infarct; no ischemia, not gated   History of Present Illness:    Mr. [name] was last seen in clinic by Ronald Perch, NP in 03/2019.  He returns for follow-up.  He has been started on CPAP.  His blood pressures have been somewhat better since starting this.  He tends to stay up fairly late.  He has not had anything to eat today and has not taken any blood pressure medications yet.  He has difficulty taking hydralazine 3 times a day.  He has not had chest pain, shortness of breath, syncope, orthopnea.  He has mild pedal edema without significant change       Past Medical History:  Diagnosis Date  . Adenomatous colon polyp 2011  . Anemia   . Angina pectoris ([facility]) 05/01/2015   med rx for 95% OM (u/a to access due to tortuosity), 95% D1 and other moderate CAD at cath  . Arthritis    "back, knees"  (05/01/2015)  . Chest pain    pleuritic  . Chronic lower back pain   . Complex tear of medial meniscus of left knee 09/14/2018  . Coronary artery disease   . Depression   . GERD (gastroesophageal reflux disease)   . HTN (hypertension)   . Hyperlipidemia   . Obstructive sleep apnea    noncompliant with CPAP  . Osteoarthritis   . Pneumonia ~ 2014 X 1  . Prostate cancer ([facility])   . Refusal of blood transfusions as patient is Jehovah's Witness   . Rotator cuff arthropathy, right   . Rupture of right supraspinatus tendon 01/18/2019  . Type II or unspecified type diabetes mellitus without mention of complication, not stated as uncontrolled     Current Medications: Current Meds  Medication Sig  . albuterol (VENTOLIN HFA) 108 (90 Base) MCG/ACT inhaler Inhale 1-2 puffs into the lungs every 4 (four) hours as needed for wheezing or shortness of breath.  . Blood Glucose Monitoring Suppl (RELION PREMIER BLU MONITOR) DEVI 1 Device by Does not apply route daily.  . fluticasone (FLONASE) 50 MCG/ACT nasal spray Place 1-2 sprays into both nostrils daily.  Marland Kitchen glipiZIDE (GLUCOTROL XL) 5 MG 24 hr tablet TAKE 1 TABLET BY MOUTH TWICE A DAY  .  glucose blood test strip Use as instructed to check sugar 2 times daily  . hydrALAZINE (APRESOLINE) 25 MG tablet Take 1 tablet (25 mg total) by mouth in the morning and at bedtime.  . hydrochlorothiazide (HYDRODIURIL) 25 MG tablet TAKE 1 TABLET BY MOUTH EVERY DAY  . Insulin Detemir (LEVEMIR FLEXTOUCH) 100 UNIT/ML Pen INJECT 20 UNITS INTO THE SKIN DAILY AT 10 PM.  . Insulin Pen Needle (NOVOFINE PLUS) 32G X 4 MM MISC Use 1x a day  . losartan (COZAAR) 100 MG tablet TAKE 1 TABLET BY MOUTH EVERY DAY  . metFORMIN (GLUCOPHAGE) 500 MG tablet TAKE 2 TABLETS (1,000 MG TOTAL) BY MOUTH 2 (TWO) TIMES DAILY WITH A MEAL.  . metoprolol tartrate (LOPRESSOR) 50 MG tablet Take 1 tablet (50 mg total) by mouth 2 (two) times daily.  . nitroGLYCERIN (NITROSTAT) 0.4 MG SL tablet PLACE 1 TABLET  UNDER THE TONGUE EVERY 5 MIN X 3 DOSES AS NEEDED FOR CHEST PAIN  . pravastatin (PRAVACHOL) 40 MG tablet TAKE 1 TABLET ONCE DAILY  . Semaglutide, 1 MG/DOSE, 2 MG/1.5ML SOPN Inject into the skin once a week.  . sertraline (ZOLOFT) 25 MG tablet Take 0.5 tablets (12.5 mg total) by mouth daily.  . [DISCONTINUED] hydrALAZINE (APRESOLINE) 25 MG tablet Take 1 tablet (25 mg total) by mouth 3 (three) times daily.     Allergies:   Statins and Imdur [isosorbide dinitrate]   Social History   Tobacco Use  . Smoking status: Former Smoker    Packs/day: 0.50    Years: 10.00    Pack years: 5.00    Types: Cigarettes    Quit date: 04/06/1983    Years since quitting: 36.4  . Smokeless tobacco: Never Used  Substance Use Topics  . Alcohol use: Yes    Alcohol/week: 0.0 standard drinks    Comment: `/26/2017 "might drink a beer q couple months mostly; summertime I might drink 2-3 beers/week"  . Drug use: No     Family Hx: The patient's family history includes Alcohol abuse in an other family member; Colon cancer in his paternal uncle; Coronary artery disease in his mother and another family member; Diabetes in his maternal uncle, mother, paternal uncle, and sister; Heart disease in his mother; Other in his father; Stroke in his mother. There is no history of Colon polyps or Esophageal cancer.  Review of Systems  Constitution: Negative for fever.  Respiratory: Negative for cough.   Musculoskeletal: Positive for myalgias.  Gastrointestinal: Negative for hematochezia.  Genitourinary: Negative for hematuria.     EKGs/Labs/Other Test Reviewed:    EKG:  EKG is   ordered today.  The ekg ordered today demonstrates normal sinus rhythm HR 70 LAD, septal Q waves, 1st degree AVB (PR 248 ms), QTc 416 ms, non-specific ST-TW changes, no change since last tracing   Recent Labs: 09/25/2018: ALT 18 04/09/2019: BUN 12; Creatinine, Ser 0.91; Hemoglobin 14.6; Platelets 186; Potassium 3.9; Sodium 141; TSH 1.210   Recent  Lipid Panel Lab Results  Component Value Date/Time   CHOL 111 09/25/2018 09:30 AM   TRIG 117 09/25/2018 09:30 AM   HDL 44 09/25/2018 09:30 AM   CHOLHDL 2.5 09/25/2018 09:30 AM   CHOLHDL 4 06/19/2015 04:24 PM   LDLCALC 44 09/25/2018 09:30 AM   LDLDIRECT 131 (H) 03/22/2019 02:27 PM   LDLDIRECT 168.0 12/18/2012 01:40 PM    Physical Exam:    VS:  BP (!) 148/84   Pulse 81   Ht _0  (1.778 m)   Wt  257 lb 1.9 oz (116.6 kg)   SpO2 96%   BMI 36.89 kg/m     Wt Readings from Last 3 Encounters:  08/29/19 257 lb 1.9 oz (116.6 kg)  08/13/19 257 lb (116.6 kg)  06/11/19 258 lb (117 kg)     Constitutional:      Appearance: Healthy appearance. Not in distress.  Neck:     Thyroid: No thyromegaly.     Vascular: JVD normal.  Pulmonary:     Effort: Pulmonary effort is normal.     Breath sounds: No wheezing. No rales.  Cardiovascular:     Normal rate. Regular rhythm. Normal S1. Normal S2.     Murmurs: There is no murmur.  Edema:    Pretibial: bilateral trace edema of the pretibial area. Abdominal:     Palpations: Abdomen is soft. There is no hepatomegaly.  Skin:    General: Skin is warm and dry.  Neurological:     General: No focal deficit present.     Mental Status: Alert and oriented to person, place and time.     Cranial Nerves: Cranial nerves are intact.       ASSESSMENT & PLAN:    1. Coronary artery disease involving native coronary artery of native heart without angina pectoris History of drug-eluting stent to the LCx in 2016.  He had a failed attempted PCI of the OM1 in 2017.  Overall, he is doing well without anginal symptoms.  It is noted that aspirin is not on his medication list.  We will contact him to determine if he is able to take aspirin.  If so, I will have him resume aspirin 81 mg daily.  Continue pravastatin.  2. Essential (primary) hypertension Blood pressure above target.  He has not taken any medications yet today.  I have advised him to change his  hydralazine to twice daily and try to get it every 12 hours.  I have also advised him to try to get metoprolol every 12 hours.  I have asked him to increase his activity, work on weight loss and limit salt.  I will have him see the hypertension clinic in 4 weeks.  I have asked him to check his blood pressure with his arm cuff and bring a list of blood pressure readings as well as his machine with him to his appointment.    3. Hyperlipidemia LDL goal <70 He was unable to tolerate evolocumab.  He stopped this due to myalgias.  He is back on pravastatin.  He is fasting c-Met, lipids.  If LDL above 70, consider referral to the lipid clinic.    Dispo:  Return in about 6 months (around 02/29/2020) for Routine Follow Up w/ Dr. Tamala Julian, in person.   Medication Adjustments/Labs and Tests Ordered: Current medicines are reviewed at length with the patient today.  Concerns regarding medicines are outlined above.  Tests Ordered: Orders Placed This Encounter  Procedures  . Comprehensive metabolic panel  . Lipid panel  . AMB REFERRAL TO [facility]ED [facility] CLIN[facility]  . EKG 12-Lead   Medication Changes: Meds ordered this encounter  Medications  . hydrALAZINE (APRESOLINE) 25 MG tablet    Sig: Take 1 tablet (25 mg total) by mouth in the morning and at bedtime.    Dispense:  180 tablet    Refill:  1    Signed, Richardson Dopp, PA-C  08/29/2019 5:03 PM    [facility] [address], [place], [place]  [address] Phone: [phone];  Fax: [phone]

## 2019-08-29 NOTE — Telephone Encounter (Signed)
I noticed the patient is not on ASA after he left. Please see if he is able to take ASA. If so, start ASA 81 mg once daily. Richardson Dopp, PA-C    08/29/2019 5:24 PM

## 2019-08-29 NOTE — Patient Instructions (Addendum)
Medication Instructions:   Your physician has recommended you make the following change in your medication:   1) Decrease Hydralazine to 25 mg, 1 tablet by mouth twice a day  *If you need a refill on your cardiac medications before your next appointment, please call your pharmacy*  Lab Work:  You will have labs drawn today: CMET/lipds  If you have labs (blood work) drawn today and your tests are completely normal, you will receive your results only by: Marland Kitchen MyChart Message (if you have MyChart) OR . A paper copy in the mail If you have any lab test that is abnormal or we need to change your treatment, we will call you to review the results.  Testing/Procedures:  None ordered today  Follow-Up:  On 03/04/20 at 11:20AM with [name], MD  Other Instructions  Take your Metoprolol and Hydralazine every 12 hours. Check your blood pressure 4 times a week and bring readings and blood pressure machine to appointment with hypertension clinic.  Two Gram Sodium Diet 2000 mg  What is Sodium? Sodium is a mineral found naturally in many foods. The most significant source of sodium in the diet is table salt, which is about 40% sodium.  Processed, convenience, and preserved foods also contain a large amount of sodium.  The body needs only 500 mg of sodium daily to function,  A normal diet provides more than enough sodium even if you do not use salt.  Why Limit Sodium? A build up of sodium in the body can cause thirst, increased blood pressure, shortness of breath, and water retention.  Decreasing sodium in the diet can reduce edema and risk of heart attack or stroke associated with high blood pressure.  Keep in mind that there are many other factors involved in these health problems.  Heredity, obesity, lack of exercise, cigarette smoking, stress and what you eat all play a role.  General Guidelines:  Do not add salt at the table or in cooking.  One teaspoon of salt contains over 2 grams of  sodium.  Read food labels  Avoid processed and convenience foods  Ask your dietitian before eating any foods not dicussed in the menu planning guidelines  Consult your physician if you wish to use a salt substitute or a sodium containing medication such as antacids.  Limit milk and milk products to 16 oz (2 cups) per day.  Shopping Hints:  READ LABELS!! "Dietetic" does not necessarily mean low sodium.  Salt and other sodium ingredients are often added to foods during processing.   Menu Planning Guidelines Food Group Choose More Often Avoid  Beverages (see also the milk group All fruit juices, low-sodium, salt-free vegetables juices, low-sodium carbonated beverages Regular vegetable or tomato juices, commercially softened water used for drinking or cooking  Breads and Cereals Enriched white, wheat, rye and pumpernickel bread, hard rolls and dinner rolls; muffins, cornbread and waffles; most dry cereals, cooked cereal without added salt; unsalted crackers and breadsticks; low sodium or homemade bread crumbs Bread, rolls and crackers with salted tops; quick breads; instant hot cereals; pancakes; commercial bread stuffing; self-rising flower and biscuit mixes; regular bread crumbs or cracker crumbs  Desserts and Sweets Desserts and sweets mad with mild should be within allowance Instant pudding mixes and cake mixes  Fats Butter or margarine; vegetable oils; unsalted salad dressings, regular salad dressings limited to 1 Tbs; light, sour and heavy cream Regular salad dressings containing bacon fat, bacon bits, and salt pork; snack dips made with instant soup  mixes or processed cheese; salted nuts  Fruits Most fresh, frozen and canned fruits Fruits processed with salt or sodium-containing ingredient (some dried fruits are processed with sodium sulfites        Vegetables Fresh, frozen vegetables and low- sodium canned vegetables Regular canned vegetables, sauerkraut, pickled vegetables, and  others prepared in brine; frozen vegetables in sauces; vegetables seasoned with ham, bacon or salt pork  Condiments, Sauces, Miscellaneous  Salt substitute with physician's approval; pepper, herbs, spices; vinegar, lemon or lime juice; hot pepper sauce; garlic powder, onion powder, low sodium soy sauce (1 Tbs.); low sodium condiments (ketchup, chili sauce, mustard) in limited amounts (1 tsp.) fresh ground horseradish; unsalted tortilla chips, pretzels, potato chips, popcorn, salsa (1/4 cup) Any seasoning made with salt including garlic salt, celery salt, onion salt, and seasoned salt; sea salt, rock salt, kosher salt; meat tenderizers; monosodium glutamate; mustard, regular soy sauce, barbecue, sauce, chili sauce, teriyaki sauce, steak sauce, Worcestershire sauce, and most flavored vinegars; canned gravy and mixes; regular condiments; salted snack foods, olives, picles, relish, horseradish sauce, catsup   Food preparation: Try these seasonings Meats:    Pork Sage, onion Serve with applesauce  Chicken Poultry seasoning, thyme, parsley Serve with cranberry sauce  Lamb Curry powder, rosemary, garlic, thyme Serve with mint sauce or jelly  Veal Marjoram, basil Serve with current jelly, cranberry sauce  Beef Pepper, bay leaf Serve with dry mustard, unsalted chive butter  Fish [place] leaf, dill Serve with unsalted lemon butter, unsalted parsley butter  Vegetables:    Asparagus Lemon juice   Broccoli Lemon juice   Carrots Mustard dressing parsley, mint, nutmeg, glazed with unsalted butter and sugar   Green beans Marjoram, lemon juice, nutmeg,dill seed   Tomatoes Basil, marjoram, onion   Spice /blend for [employer]" 4 tsp ground thyme 1 tsp ground sage 3 tsp ground rosemary 4 tsp ground marjoram   Test your knowledge 1. A product that says "Salt Free" may still contain sodium. True or False 2. Garlic Powder and Hot Pepper Sauce an be used as alternative seasonings.True or False 3. Processed foods have  more sodium than fresh foods.  True or False 4. Canned Vegetables have less sodium than froze True or False  WAYS TO DECREASE YOUR SODIUM INTAKE 1. Avoid the use of added salt in cooking and at the table.  Table salt (and other prepared seasonings which contain salt) is probably one of the greatest sources of sodium in the diet.  Unsalted foods can gain flavor from the sweet, sour, and butter taste sensations of herbs and spices.  Instead of using salt for seasoning, try the following seasonings with the foods listed.  Remember: how you use them to enhance natural food flavors is limited only by your creativity... Allspice-Meat, fish, eggs, fruit, peas, red and yellow vegetables Almond Extract-Fruit baked goods Anise Seed-Sweet breads, fruit, carrots, beets, cottage cheese, cookies (tastes like licorice) Basil-Meat, fish, eggs, vegetables, rice, vegetables salads, soups, sauces Bay Leaf-Meat, fish, stews, poultry Burnet-Salad, vegetables (cucumber-like flavor) Caraway Seed-Bread, cookies, cottage cheese, meat, vegetables, cheese, rice Cardamon-Baked goods, fruit, soups Celery Powder or seed-Salads, salad dressings, sauces, meatloaf, soup, bread.Do not use  celery salt Chervil-Meats, salads, fish, eggs, vegetables, cottage cheese (parsley-like flavor) Chili Power-Meatloaf, chicken cheese, corn, eggplant, egg dishes Chives-Salads cottage cheese, egg dishes, soups, vegetables, sauces Cilantro-Salsa, casseroles Cinnamon-Baked goods, fruit, pork, lamb, chicken, carrots Cloves-Fruit, baked goods, fish, pot roast, green beans, beets, carrots Coriander-Pastry, cookies, meat, salads, cheese (lemon-orange flavor) Cumin-Meatloaf, fish,cheese, eggs, cabbage,fruit  pie (caraway flavor) [employer], fruit, eggs, fish, poultry, cottage cheese, vegetables Dill Seed-Meat, cottage cheese, poultry, vegetables, fish, salads, bread Fennel Seed-Bread, cookies, apples, pork, eggs, fish, beets, cabbage, cheese,  Licorice-like flavor Garlic-(buds or powder) Salads, meat, poultry, fish, bread, butter, vegetables, potatoes.Do not  use garlic salt Ginger-Fruit, vegetables, baked goods, meat, fish, poultry Horseradish Root-Meet, vegetables, butter Lemon Juice or Extract-Vegetables, fruit, tea, baked goods, fish salads Mace-Baked goods fruit, vegetables, fish, poultry (taste like nutmeg) Maple Extract-Syrups Marjoram-Meat, chicken, fish, vegetables, breads, green salads (taste like Sage) Mint-Tea, lamb, sherbet, vegetables, desserts, carrots, cabbage Mustard, Dry or Seed-Cheese, eggs, meats, vegetables, poultry Nutmeg-Baked goods, fruit, chicken, eggs, vegetables, desserts Onion Powder-Meat, fish, poultry, vegetables, cheese, eggs, bread, rice salads (Do not use   Onion salt) Orange Extract-Desserts, baked goods Oregano-Pasta, eggs, cheese, onions, pork, lamb, fish, chicken, vegetables, green salads Paprika-Meat, fish, poultry, eggs, cheese, vegetables Parsley Flakes-Butter, vegetables, meat fish, poultry, eggs, bread, salads (certain forms may   Contain sodium Pepper-Meat fish, poultry, vegetables, eggs Peppermint Extract-Desserts, baked goods Poppy Seed-Eggs, bread, cheese, fruit dressings, baked goods, noodles, vegetables, cottage  [employer], poultry, meat, fish, cauliflower, turnips,eggs bread Saffron-Rice, bread, veal, chicken, fish, eggs Sage-Meat, fish, poultry, onions, eggplant, tomateos, pork, stews Savory-Eggs, salads, poultry, meat, rice, vegetables, soups, pork Tarragon-Meat, poultry, fish, eggs, butter, vegetables (licorice-like flavor)  Thyme-Meat, poultry, fish, eggs, vegetables, (clover-like flavor), sauces, soups Tumeric-Salads, butter, eggs, fish, rice, vegetables (saffron-like flavor) Vanilla Extract-Baked goods, candy Vinegar-Salads, vegetables, meat marinades Walnut Extract-baked goods, candy  2. Choose your Foods Wisely   The  following is a list of foods to avoid which are high in sodium:  Meats-Avoid all smoked, canned, salt cured, dried and kosher meat and fish as well as Anchovies   [employer] meats:Bologna, Liverwurst, Pastrami Canned meat or fish  Marinated herring Caviar    Pepperoni Corned Beef   Pizza Dried chipped beef  Salami Frozen breaded fish or meat Salt pork Frankfurters or hot dogs  Sardines Gefilte fish   Sausage Ham (boiled ham, Proscuitto Smoked butt    spiced ham)   Spam      TV Dinners Vegetables Canned vegetables (Regular) Relish Canned mushrooms  Sauerkraut Olives    Tomato juice Pickles  Bakery and Dessert Products Canned puddings  Cream pies Cheesecake   Decorated cakes Cookies  Beverages/Juices Tomato juice, regular  Gatorade   V-8 vegetable juice, regular  Breads and Cereals Biscuit mixes   Salted potato chips, corn chips, pretzels Bread stuffing mixes  Salted crackers and rolls Pancake and waffle mixes Self-rising flour  Seasonings Accent    Meat sauces Barbecue sauce  Meat tenderizer Catsup    Monosodium glutamate (MSG) Celery salt   Onion salt Chili sauce   Prepared mustard Garlic salt   Salt, seasoned salt, sea salt Gravy mixes   Soy sauce Horseradish   Steak sauce Ketchup   Tartar sauce Lite salt    Teriyaki sauce Marinade mixes   Worcestershire sauce  Others Baking powder   Cocoa and cocoa mixes Baking soda   Commercial casserole mixes Candy-caramels, chocolate  Dehydrated soups    Bars, fudge,nougats  Instant rice and pasta mixes Canned broth or soup  Maraschino cherries Cheese, aged and processed cheese and cheese spreads  Learning Assessment Quiz  Indicated T (for True) or F (for False) for each of the following statements:  1. _____ Fresh fruits and vegetables and unprocessed grains are generally low in sodium 2. _____ Water may  contain a considerable amount of sodium, depending on the source 3. _____ You can always tell if a  food is high in sodium by tasting it 4. _____ Certain laxatives my be high in sodium and should be avoided unless prescribed   by a physician or pharmacist 5. _____ Salt substitutes may be used freely by anyone on a sodium restricted diet 6. _____ Sodium is present in table salt, food additives and as a natural component of   most foods 7. _____ Table salt is approximately 90% sodium 8. _____ Limiting sodium intake may help prevent excess fluid accumulation in the body 9. _____ On a sodium-restricted diet, seasonings such as bouillon soy sauce, and    cooking wine should be used in place of table salt 10. _____ On an ingredient list, a product which lists monosodium glutamate as the first   ingredient is an appropriate food to include on a low sodium diet  Circle the best answer(s) to the following statements (Hint: there may be more than one correct answer)  11. On a low-sodium diet, some acceptable snack items are:    A. Olives  F. Bean dip   K. Grapefruit juice    B. Salted Pretzels G. Commercial Popcorn   L. Canned peaches    C. Carrot Sticks  H. Bouillon   M. Unsalted nuts   D. [place] fries  I. Peanut butter crackers N. Salami   E. Sweet pickles J. Tomato Juice   O. Pizza  12.  Seasonings that may be used freely on a reduced - sodium diet include   A. Lemon wedges F.Monosodium glutamate K. Celery seed    B.Soysauce   G. Pepper   L. Mustard powder   C. Sea salt  H. Cooking wine  M. Onion flakes   D. Vinegar  E. Prepared horseradish N. Salsa   E. Sage   J. Worcestershire sauce  O. Chutney

## 2019-08-30 LAB — COMPREHENSIVE METABOLIC PANEL
ALT: 17 IU/L (ref 0–44)
AST: 13 IU/L (ref 0–40)
Albumin/Globulin Ratio: 1.6 (ref 1.2–2.2)
Albumin: 3.9 g/dL (ref 3.8–4.8)
Alkaline Phosphatase: 96 IU/L (ref 48–121)
BUN/Creatinine Ratio: 11 (ref 10–24)
BUN: 11 mg/dL (ref 8–27)
Bilirubin Total: 0.2 mg/dL (ref 0.0–1.2)
CO2: 27 mmol/L (ref 20–29)
Calcium: 9 mg/dL (ref 8.6–10.2)
Chloride: 105 mmol/L (ref 96–106)
Creatinine, Ser: 0.97 mg/dL (ref 0.76–1.27)
GFR calc Af Amer: 92 mL/min/{1.73_m2} (ref >59)
GFR calc non Af Amer: 79 mL/min/{1.73_m2} (ref >59)
Globulin, Total: 2.5 g/dL (ref 1.5–4.5)
Glucose: 119 mg/dL — ABNORMAL HIGH (ref 65–99)
Potassium: 4.2 mmol/L (ref 3.5–5.2)
Sodium: 144 mmol/L (ref 134–144)
Total Protein: 6.4 g/dL (ref 6.0–8.5)

## 2019-08-30 LAB — LIPID PANEL
Chol/HDL Ratio: 4.1 ratio (ref 0.0–5.0)
Cholesterol, Total: 154 mg/dL (ref 100–199)
HDL: 38 mg/dL — ABNORMAL LOW (ref >39)
LDL Chol Calc (NIH): 101 mg/dL — ABNORMAL HIGH (ref 0–99)
Triglycerides: 78 mg/dL (ref 0–149)
VLDL Cholesterol Cal: 15 mg/dL (ref 5–40)

## 2019-08-31 ENCOUNTER — Other Ambulatory Visit: Payer: Self-pay

## 2019-08-31 DIAGNOSIS — E785 Hyperlipidemia, unspecified: Secondary | ICD-10-CM

## 2019-08-31 NOTE — Progress Notes (Signed)
Referral started  

## 2019-08-31 NOTE — Telephone Encounter (Signed)
I tried to call patient, there was no answer and voicemail has not been set up yet.

## 2019-08-31 NOTE — Telephone Encounter (Signed)
Sent patient a Designer, television/film set information per [employer]

## 2019-09-05 DIAGNOSIS — G4733 Obstructive sleep apnea (adult) (pediatric): Secondary | ICD-10-CM | POA: Diagnosis not present

## 2019-09-16 ENCOUNTER — Other Ambulatory Visit: Payer: Self-pay | Admitting: Internal Medicine

## 2019-09-18 ENCOUNTER — Ambulatory Visit: Payer: Federal, State, Local not specified - PPO

## 2019-09-21 ENCOUNTER — Other Ambulatory Visit: Payer: Self-pay | Admitting: Family Medicine

## 2019-09-21 DIAGNOSIS — R059 Cough, unspecified: Secondary | ICD-10-CM

## 2019-09-21 DIAGNOSIS — R0982 Postnasal drip: Secondary | ICD-10-CM

## 2019-09-25 ENCOUNTER — Other Ambulatory Visit: Payer: Self-pay

## 2019-09-25 ENCOUNTER — Ambulatory Visit (INDEPENDENT_AMBULATORY_CARE_PROVIDER_SITE_OTHER): Payer: Federal, State, Local not specified - PPO | Admitting: Pharmacist

## 2019-09-25 VITALS — BP 164/82 | HR 71

## 2019-09-25 DIAGNOSIS — I10 Essential (primary) hypertension: Secondary | ICD-10-CM

## 2019-09-25 MED ORDER — SPIRONOLACTONE 25 MG PO TABS: 12.5000 mg | ORAL_TABLET | Freq: Every day | ORAL | 1 refills | Status: DC

## 2019-09-25 NOTE — Patient Instructions (Addendum)
It was great meeting with you today!  Your blood pressure goal is less than 130/80  Continue your hydrochlorothiazide, losartan, hydralazine and metoprolol  Your new medication is spironolactone.  I want you to take 1/2 tablet once a day  We will check your blood work next Tuesday  Karren Cobble, PharmD, [employer], [place] [address] [place], [place] [address] Phone: [phone]; Fax: [phone] 09/25/2019 2:13 PM

## 2019-09-25 NOTE — Progress Notes (Signed)
Patient ID: NAJEH CREDIT                 DOB: March 07, 1951                      MRN: [id number]     HPI: [name] is a 69 y.o. male referred by Richardson Dopp to HTN and HLD clinic. PMH is significant for CAD, HTN, HLD, T2DM, and prostate cancer.  Was seen on 5/26 and it was revealed that patient was not taking his BP medications at 12 hour intervals.  Was taking one tablet ~11pm and then 4am.   Patient presents today in good spirits.  However, he believes he is on too many medications and he self reports that he is not always compliant.  Had his CPAP for many months before he started using it and realized the benefits.  Was prescribed spironolactone in early 2020 but did not take it because he was told his bloodwork would need to be monitored.  Does not always follow directions as prescribed.  Has noticed however that he feels better now that he is taking his metoprolol and hydralazine 12 hours apart.  Uses CPAP nightly now.  Patient has two BP cuffs, manual and electronic but is confused how to use them.  He thought his Omron machine was not working properly so he mailed it to the manufacturer who then mailed it back and told him it was calibrated properly.  Checked BP readings in memory and all readings elevated, typically 170s/110s.     Current HTN meds: HCTZ 25mg , losartan 100mg  daily, metoprolol tartrate 50 mg BID, hydralazine 25 mg BID   Home BP readings:   Wt Readings from Last 3 Encounters:  08/29/19 257 lb 1.9 oz (116.6 kg)  08/13/19 257 lb (116.6 kg)  06/11/19 258 lb (117 kg)   BP Readings from Last 3 Encounters:  08/29/19 (!) 148/84  08/13/19 (!) 148/80  06/11/19 134/80   Pulse Readings from Last 3 Encounters:  08/29/19 81  08/13/19 80  06/11/19 75    Renal function: CrCl cannot be calculated (Patient's most recent lab result is older than the maximum 21 days allowed.).  Past Medical History:  Diagnosis Date  . Adenomatous colon polyp 2011  . Anemia   . Angina  pectoris ([place]) 05/01/2015   med rx for 95% OM (u/a to access due to tortuosity), 95% D1 and other moderate CAD at cath  . Arthritis    "back, knees" (05/01/2015)  . Chest pain    pleuritic  . Chronic lower back pain   . Complex tear of medial meniscus of left knee 09/14/2018  . Coronary artery disease   . Depression   . GERD (gastroesophageal reflux disease)   . HTN (hypertension)   . Hyperlipidemia   . Obstructive sleep apnea    noncompliant with CPAP  . Osteoarthritis   . Pneumonia ~ 2014 X 1  . Prostate cancer ([place])   . Refusal of blood transfusions as patient is Jehovah's Witness   . Rotator cuff arthropathy, right   . Rupture of right supraspinatus tendon 01/18/2019  . Type II or unspecified type diabetes mellitus without mention of complication, not stated as uncontrolled     Current Outpatient Medications on File Prior to Visit  Medication Sig Dispense Refill  . albuterol (VENTOLIN HFA) 108 (90 Base) MCG/ACT inhaler Inhale 1-2 puffs into the lungs every 4 (four) hours as needed for wheezing or shortness of  breath. 18 g 0  . Blood Glucose Monitoring Suppl (RELION PREMIER BLU MONITOR) DEVI 1 Device by Does not apply route daily. 1 Device 0  . fluticasone (FLONASE) 50 MCG/ACT nasal spray PLACE 1-2 SPRAYS INTO BOTH NOSTRILS DAILY. 48 mL 2  . glipiZIDE (GLUCOTROL XL) 5 MG 24 hr tablet TAKE 1 TABLET BY MOUTH TWICE A DAY 180 tablet 1  . glucose blood test strip Use as instructed to check sugar 2 times daily 200 each 5  . hydrALAZINE (APRESOLINE) 25 MG tablet Take 1 tablet (25 mg total) by mouth in the morning and at bedtime. 180 tablet 1  . hydrochlorothiazide (HYDRODIURIL) 25 MG tablet TAKE 1 TABLET BY MOUTH EVERY DAY 90 tablet 3  . Insulin Detemir (LEVEMIR FLEXTOUCH) 100 UNIT/ML Pen INJECT 20 UNITS INTO THE SKIN DAILY AT 10 PM. 15 mL 3  . Insulin Pen Needle (NOVOFINE PLUS) 32G X 4 MM MISC Use 1x a day 100 each 11  . losartan (COZAAR) 100 MG tablet TAKE 1 TABLET BY MOUTH EVERY DAY  90 tablet 3  . metFORMIN (GLUCOPHAGE) 500 MG tablet TAKE 2 TABLETS (1,000 MG TOTAL) BY MOUTH 2 (TWO) TIMES DAILY WITH A MEAL. 360 tablet 0  . metoprolol tartrate (LOPRESSOR) 50 MG tablet Take 1 tablet (50 mg total) by mouth 2 (two) times daily. 180 tablet 3  . nitroGLYCERIN (NITROSTAT) 0.4 MG SL tablet PLACE 1 TABLET UNDER THE TONGUE EVERY 5 MIN X 3 DOSES AS NEEDED FOR CHEST PAIN 75 tablet 2  . pravastatin (PRAVACHOL) 40 MG tablet TAKE 1 TABLET ONCE DAILY 90 tablet 3  . Semaglutide, 1 MG/DOSE, 2 MG/1.5ML SOPN Inject into the skin once a week.    . sertraline (ZOLOFT) 25 MG tablet Take 0.5 tablets (12.5 mg total) by mouth daily. 45 tablet 1   No current facility-administered medications on file prior to visit.    Allergies  Allergen Reactions  . Statins Other (See Comments)    REACTION: joint pain Lipitor- headaches Has also tried Livalo, pravachol, zetia, crestor, welchol  . Imdur [Isosorbide Dinitrate]     Headache - "violent"     Assessment/Plan:  1. Hypertension - Patient blood pressure in room today 164/82 which is above goal of <130/80.  Counseled patient on importance of medication compliance.  Taught patient proper technique on how to use electronic blood pressure monitor in room and patient was able to use properly.  Discussed importance of taking medications as directed and achieving BP/lipid/DM goals can help eliminate pill burden.  Recommended he try to increase physical activity up to 30 minutes a day up to 5 days a week.  After advising patient the reason why bloodwork was needed when he was prescribed spironolactone over a year ago, patient was willing to try again.  Will start spironolactone 12.5 mg once daily and check BMP in 1 week.  Will schedule follow up at that time.  Patient voiced understanding.  2. Hyperlipidemia - Will continue pravastatin 40 mg once daily at this time and reassess in 1 month.  Karren Cobble, PharmD, BCACP, [place] [address], [place], [place] [address] Phone: [phone]; Fax: [phone] 09/25/2019 4:32 PM

## 2019-09-26 DIAGNOSIS — C61 Malignant neoplasm of prostate: Secondary | ICD-10-CM | POA: Diagnosis not present

## 2019-09-27 ENCOUNTER — Encounter: Payer: Self-pay | Admitting: Internal Medicine

## 2019-09-27 ENCOUNTER — Other Ambulatory Visit: Payer: Self-pay

## 2019-09-27 ENCOUNTER — Ambulatory Visit: Payer: Federal, State, Local not specified - PPO

## 2019-09-27 ENCOUNTER — Ambulatory Visit (INDEPENDENT_AMBULATORY_CARE_PROVIDER_SITE_OTHER): Payer: Federal, State, Local not specified - PPO | Admitting: Internal Medicine

## 2019-09-27 VITALS — BP 130/80 | HR 88 | Ht 70.0 in | Wt 254.0 lb

## 2019-09-27 DIAGNOSIS — E1159 Type 2 diabetes mellitus with other circulatory complications: Secondary | ICD-10-CM | POA: Diagnosis not present

## 2019-09-27 DIAGNOSIS — E669 Obesity, unspecified: Secondary | ICD-10-CM | POA: Diagnosis not present

## 2019-09-27 DIAGNOSIS — E785 Hyperlipidemia, unspecified: Secondary | ICD-10-CM | POA: Diagnosis not present

## 2019-09-27 LAB — POCT GLYCOSYLATED HEMOGLOBIN (HGB A1C): Hemoglobin A1C: 6.7 % — AB (ref 4.0–5.6)

## 2019-09-27 NOTE — Progress Notes (Signed)
Patient ID: [name], male   DOB: 16-Feb-1951, 69 y.o.   MRN: [id number]  This visit occurred during the SARS-CoV-2 public health emergency.  Safety protocols were in place, including screening questions prior to the visit, additional usage of staff PPE, and extensive cleaning of exam room while observing appropriate contact time as indicated for disinfecting solutions.   HPI: [name] is a 69 y.o.-year-old male, returning for f/u for DM2, dx 2007, prev. insulin-dependent since 2014, uncontrolled, with complications (CAD - s/p NSTEMI 11/2014 - stent; ED). Last visit 1.5 months ago. PCP: Dr Odette Fraction.  Reviewed HbA1c levels: Lab Results  Component Value Date   HGBA1C 7.9 (A) 08/13/2019   HGBA1C 8.1 (A) 02/13/2019   HGBA1C 7.7 (A) 10/05/2018   Pt is on a regimen of: - Metformin 1000 mg 2x a day - Glipizide XL 5 mg 2x a day - break/crush the evening dose - Levemir 20 units at bedtime - Ozempic 0.5 mg weekly >> recommended 10/2018 >> finally started 02/2019 - took 4 doses and stopped for fear of SEs >> restarted 08/2019 at 0.25 mg weekly - initially took it EVERY DAY for few days >> CBG 23!!!!  He tried Toujeo 20 units at night but did not like it He did not start Januvia as suggested at last visit ("I don't think I need it"). He was Levemir 140 units in am >> decreased to 70 units>> stopped as this was too expensive for him He stopped Welchol 3.7g at bedtime a day. Stopped Farxiga 10 mg b/c frequent urination and urgency. He was on Glipizide in the past. He was on Metformin in the past >> no N/V.   Pt checks his sugars once a day: - am: 150-180 (eating during the night) >> 150 >> 130-140 >> 98-100 - 2h after b'fast: n/c >> 128, 134 >> n/c - before lunch:   206 >> n/c >> 140s >> n/c >> 100-120 - 2h after lunch: n/c >> 140-157 >> n/c  - before dinner:  106-120 >> n/c >> 80-130 >> n/c - 2h after dinner: 200-245 >> 150-180, 200 >> n/c - bedtime:  180-200 >> 180-200 >> 180-200 >>  230-240 >> 137-140 - nighttime:94, 190-305 >> 148-216 >> n/c  Lowest sugar was 140 >> 130 >> 130 >> 23!!!; he has hypoglycemia awareness in the 80s. Highest sugar was 250 >> 200 >> 240 >> 180.  -+ Mild CKD, last BUN/creatinine:  Lab Results  Component Value Date   BUN 11 08/29/2019   CREATININE 0.97 08/29/2019  09/07/2016: 13/0.9, glucose 133 On losartan. -+ HL; last set of lipids: Lab Results  Component Value Date   CHOL 154 08/29/2019   HDL 38 (L) 08/29/2019   LDLCALC 101 (H) 08/29/2019   LDLDIRECT 131 (H) 03/22/2019   TRIG 78 08/29/2019   CHOLHDL 4.1 08/29/2019  09/07/2016: 167/65/33/121 He was on pravastatin but could not tolerate it due to muscle aches and weakness.  He stopped Repatha 2/2 mm weakness (?). - last eye exam was in 2020: No DR; Dr. Lady Gary. - no numbness and tingling in his feet.  Pt was admitted for CP in 11/2014: NSTEMI >> CAD >> PTCA >> stent placed.  He was in ED with CP 05/2017 >> no cardiac pathology. He had shoulder surgery 01/18/2019 after rupture of his supraspinatus tendon. In 2020, he had radiotherapy for his prostate cancer (with gold beads).    ROS: Constitutional: no weight gain/no weight loss, no fatigue, no subjective hyperthermia, no subjective  hypothermia Eyes: no blurry vision, no xerophthalmia ENT: no sore throat, no nodules palpated in neck, no dysphagia, no odynophagia, no hoarseness Cardiovascular: no CP/no SOB/no palpitations/no leg swelling Respiratory: no cough/no SOB/no wheezing Gastrointestinal: no N/no V/no D/no C/no acid reflux Musculoskeletal: no muscle aches/+ joint aches (right shoulder) Skin: no rashes, no hair loss Neurological: no tremors/no numbness/no tingling/no dizziness  I reviewed pt's medications, allergies, PMH, social hx, family hx, and changes were documented in the history of present illness. Otherwise, unchanged from my initial visit note.  Past Medical History:  Diagnosis Date  . Adenomatous colon  polyp 2011  . Anemia   . Angina pectoris ([place]) 05/01/2015   med rx for 95% OM (u/a to access due to tortuosity), 95% D1 and other moderate CAD at cath  . Arthritis    "back, knees" (05/01/2015)  . Chest pain    pleuritic  . Chronic lower back pain   . Complex tear of medial meniscus of left knee 09/14/2018  . Coronary artery disease   . Depression   . GERD (gastroesophageal reflux disease)   . HTN (hypertension)   . Hyperlipidemia   . Obstructive sleep apnea    noncompliant with CPAP  . Osteoarthritis   . Pneumonia ~ 2014 X 1  . Prostate cancer ([place])   . Refusal of blood transfusions as patient is Jehovah's Witness   . Rotator cuff arthropathy, right   . Rupture of right supraspinatus tendon 01/18/2019  . Type II or unspecified type diabetes mellitus without mention of complication, not stated as uncontrolled    Past Surgical History:  Procedure Laterality Date  . CARDIAC CATHETERIZATION  2004   patent coronary arteries  . CARDIAC CATHETERIZATION  05/01/2015   "tried to put stent in but couldn't"  . CARDIAC CATHETERIZATION N/A 05/01/2015   Procedure: Left Heart Cath and Coronary Angiography;  Surgeon: Belva Crome, MD; LAD 60%, oD1 90%, pD1 70%, D2 70%, CFX patent stent, 30% distal to prev stent, pRCA 20%, OM1 90/95%, NL LV  . CARDIAC CATHETERIZATION N/A 05/01/2015   Procedure: Coronary Stent Intervention;  Surgeon: Belva Crome, MD;  Unsuccessful PCI OM due to tortuosity  . CARDIAC CATHETERIZATION N/A 11/06/2014   Procedure: Left Heart Cath and Coronary Angiography;  Surgeon: Belva Crome, MD;  Location: [facility];  Service: Cardiovascular;  Laterality: N/A;  . CLOSED REDUCTION SHOULDER DISLOCATION Right ~ 1975   "& reattached muscle"  . COLONOSCOPY W/ BIOPSIES AND POLYPECTOMY  X 2  . ESOPHAGOGASTRODUODENOSCOPY ENDOSCOPY    . KNEE ARTHROSCOPY WITH MEDIAL MENISECTOMY Left 09/14/2018   Procedure: LEFT KNEE ARTHROSCOPY CHONDROPLASY,  WITH MEDIAL MENISECTOMY;  Surgeon:  Marchia Bond, MD;  Location: [facility];  Service: Orthopedics;  Laterality: Left;  . PILONIDAL CYST EXCISION    . SHOULDER ARTHROSCOPY WITH DEBRIDEMENT AND BICEP TENDON REPAIR Right 01/18/2019   Procedure: RIGHT SHOULDER ARTHROSCOPY WITH DEBRIDEMENT AND DECOMPRSSION SUBACROMIAL PARTIAL ACROMIOPLASTY;  Surgeon: Marchia Bond, MD;  Location: [facility];  Service: Orthopedics;  Laterality: Right;  . SHOULDER ARTHROSCOPY WITH ROTATOR CUFF REPAIR Right 01/18/2019   Procedure: SHOULDER ARTHROSCOPY WITH ROTATOR CUFF REPAIR;  Surgeon: Marchia Bond, MD;  Location: [facility];  Service: Orthopedics;  Laterality: Right;   Social History   Socioeconomic History  . Marital status: Married    Spouse name: [name]  . Number of children: 4  . Years of education: Not on file  . Highest education level: Not on file  Occupational History  . Occupation: Retired    Fish farm manager: UNEMPLOYED  Tobacco Use  . Smoking status: Former Smoker    Packs/day: 0.50    Years: 10.00    Pack years: 5.00    Types: Cigarettes    Quit date: 04/06/1983    Years since quitting: 36.5  . Smokeless tobacco: Never Used  Vaping Use  . Vaping Use: Never used  Substance and Sexual Activity  . Alcohol use: Yes    Alcohol/week: 0.0 standard drinks    Comment: `/26/2017 "might drink a beer q couple months mostly; summertime I might drink 2-3 beers/week"  . Drug use: No  . Sexual activity: Not Currently  Other Topics Concern  . Not on file  Social History Narrative   Lives in [place] with spouse Giankarlo Leamer).  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   Social Determinants of Health   Financial Resource Strain:   . Difficulty of Paying Living Expenses:   Food Insecurity:   . Worried About Charity fundraiser in the Last Year:   . Arboriculturist in the Last Year:   Transportation Needs:   . Film/video editor  (Medical):   Marland Kitchen Lack of Transportation (Non-Medical):   Physical Activity:   . Days of Exercise per Week:   . Minutes of Exercise per Session:   Stress:   . Feeling of Stress :   Social Connections:   . Frequency of Communication with Friends and Family:   . Frequency of Social Gatherings with Friends and Family:   . Attends Religious Services:   . Active Member of Clubs or Organizations:   . Attends Archivist Meetings:   Marland Kitchen Marital Status:   Intimate Partner Violence:   . Fear of Current or Ex-Partner:   . Emotionally Abused:   Marland Kitchen Physically Abused:   . Sexually Abused:    Current Outpatient Medications on File Prior to Visit  Medication Sig Dispense Refill  . albuterol (VENTOLIN HFA) 108 (90 Base) MCG/ACT inhaler Inhale 1-2 puffs into the lungs every 4 (four) hours as needed for wheezing or shortness of breath. 18 g 0  . aspirin EC 81 MG tablet Take 81 mg by mouth daily. Swallow whole.    . Blood Glucose Monitoring Suppl (RELION PREMIER BLU MONITOR) DEVI 1 Device by Does not apply route daily. 1 Device 0  . fluticasone (FLONASE) 50 MCG/ACT nasal spray PLACE 1-2 SPRAYS INTO BOTH NOSTRILS DAILY. 48 mL 2  . glipiZIDE (GLUCOTROL XL) 5 MG 24 hr tablet TAKE 1 TABLET BY MOUTH TWICE A DAY 180 tablet 1  . glucose blood test strip Use as instructed to check sugar 2 times daily 200 each 5  . hydrALAZINE (APRESOLINE) 25 MG tablet Take 1 tablet (25 mg total) by mouth in the morning and at bedtime. 180 tablet 1  . hydrochlorothiazide (HYDRODIURIL) 25 MG tablet TAKE 1 TABLET BY MOUTH EVERY DAY 90 tablet 3  . Insulin Detemir (LEVEMIR FLEXTOUCH) 100 UNIT/ML Pen INJECT 20 UNITS INTO THE SKIN DAILY AT 10 PM. 15 mL 3  . Insulin Pen Needle (NOVOFINE PLUS) 32G X 4 MM MISC Use 1x a day 100 each 11  . losartan (COZAAR) 100 MG tablet TAKE 1 TABLET BY MOUTH EVERY DAY 90 tablet 3  . metFORMIN (GLUCOPHAGE) 500 MG tablet TAKE 2 TABLETS (1,000 MG TOTAL) BY MOUTH 2 (TWO) TIMES DAILY WITH A MEAL. 360  tablet  0  . metoprolol tartrate (LOPRESSOR) 50 MG tablet Take 1 tablet (50 mg total) by mouth 2 (two) times daily. 180 tablet 3  . nitroGLYCERIN (NITROSTAT) 0.4 MG SL tablet PLACE 1 TABLET UNDER THE TONGUE EVERY 5 MIN X 3 DOSES AS NEEDED FOR CHEST PAIN 75 tablet 2  . pravastatin (PRAVACHOL) 40 MG tablet TAKE 1 TABLET ONCE DAILY 90 tablet 3  . Semaglutide, 1 MG/DOSE, 2 MG/1.5ML SOPN Inject into the skin once a week.    . sertraline (ZOLOFT) 25 MG tablet Take 0.5 tablets (12.5 mg total) by mouth daily. 45 tablet 1  . spironolactone (ALDACTONE) 25 MG tablet Take 0.5 tablets (12.5 mg total) by mouth daily. 15 tablet 1   No current facility-administered medications on file prior to visit.   Allergies  Allergen Reactions  . Statins Other (See Comments)    REACTION: joint pain Lipitor- headaches Has also tried Livalo, pravachol, zetia, crestor, welchol  . Imdur [Isosorbide Dinitrate]     Headache - "violent"   Family History  Problem Relation Age of Onset  . Coronary artery disease Other        CABG  . Coronary artery disease Mother   . Stroke Mother   . Heart disease Mother   . Diabetes Mother   . Other Father   . Diabetes Sister   . Diabetes Maternal Uncle        x2  . Alcohol abuse Other   . Diabetes Paternal Uncle        x2  . Colon cancer Paternal Uncle   . Colon polyps Neg Hx   . Esophageal cancer Neg Hx     PE: BP 130/80   Pulse 88   Ht 5\' 10"  (1.778 m)   Wt 254 lb (115.2 kg)   SpO2 97%   BMI 36.45 kg/m   Wt Readings from Last 3 Encounters:  09/27/19 254 lb (115.2 kg)  08/29/19 257 lb 1.9 oz (116.6 kg)  08/13/19 257 lb (116.6 kg)   Constitutional: overweight, in NAD Eyes: PERRLA, EOMI, no exophthalmos ENT: moist mucous membranes, no thyromegaly, no cervical lymphadenopathy Cardiovascular: RRR, No MRG Respiratory: CTA B Gastrointestinal: abdomen soft, NT, ND, BS+ Musculoskeletal: no deformities, strength intact in all 4 Skin: moist, warm, no  rashes Neurological: no tremor with outstretched hands, DTR normal in all 4  ASSESSMENT: 1. DM2, insulin-dependent, uncontrolled, with complications - CAD, s/p NSTEMI, s/p stent 11/2014 - ED  2. Obesity class II BMI Classification:  < 18.5 underweight   18.5-24.9 normal weight   25.0-29.9 overweight   30.0-34.9 class I obesity   35.0-39.9 class II obesity   ? 40.0 class III obesity   3. HL  PLAN:  1. Patient with uncontrolled type 2 diabetes, on oral antidiabetic regimen (Metformin, sulfonylurea) long-acting insulin, to which we added Ozempic at last visit.  He has been on this at the previous visits but he was reticent to start.  He finally started after last visit, but unfortunately he started taking it every day, rather than every week, as advised.  As a consequence, he developed dizziness, nausea, and blood sugar dropped to 23.  He was able to correct this and then he read the instructions mentioning that he was supposed to take it every week.  He started to take it weekly and he is diabetes control significantly improved even though he did not increase the dose to 0.5 mg weekly but continues on the 0.25 mg weekly.   -His sugars are  at goal throughout the day, without significant hyperglycemic peaks or hypoglycemic events.  He is also happy that Ozempic is decreasing his appetite. -He mentions that he does have some belching and GERD so for now, we will not increase the dose of Ozempic.  At next visit, if he tolerates it better, we can increase the dose and stop glipizide. - I suggested to:  Patient Instructions  Please continue: - Metformin 1000 mg 2x a day - Glipizide XL 5 mg 2x a day - break/crush the evening dose - Ozempic 0.25 mg weekly - Levemir 20 units at bedtime  Please come back for a follow-up appointment in 4 months.  - advised to check sugars at different times of the day - 1-2x a day, rotating check times - advised for yearly eye exams >> he is UTD - return  to clinic in 4 months  2. Obesity class 2 -We started Ozempic, which can help with weight loss -At last visit, weight was stable, at this visit, he lost 3 pounds, despite just returning from vacation in [place]  3. HL -Reviewed latest lipid panel from 08/2019: LDL improved, above goal, though, HDL low: Lab Results  Component Value Date   CHOL 154 08/29/2019   HDL 38 (L) 08/29/2019   LDLCALC 101 (H) 08/29/2019   LDLDIRECT 131 (H) 03/22/2019   TRIG 78 08/29/2019   CHOLHDL 4.1 08/29/2019  -He was started on Repatha but he stopped after he experienced muscle weakness -He also had side effects of statins: Stiffness and muscle weakness  [name], MD PhD [facility]

## 2019-09-27 NOTE — Patient Instructions (Signed)
Please continue: - Metformin 1000 mg 2x a day - Glipizide XL 5 mg 2x a day - break/crush the evening dose - Ozempic 0.25 mg weekly - Levemir 20 units at bedtime  Please come back for a follow-up appointment in 4 months.  

## 2019-09-27 NOTE — Addendum Note (Signed)
Addended by: Cardell Peach I on: 09/27/2019 04:41 PM   Modules accepted: Orders

## 2019-09-28 ENCOUNTER — Other Ambulatory Visit: Payer: Self-pay | Admitting: Internal Medicine

## 2019-09-28 ENCOUNTER — Other Ambulatory Visit: Payer: Self-pay | Admitting: Interventional Cardiology

## 2019-10-02 ENCOUNTER — Other Ambulatory Visit: Payer: Federal, State, Local not specified - PPO

## 2019-10-03 DIAGNOSIS — C61 Malignant neoplasm of prostate: Secondary | ICD-10-CM | POA: Diagnosis not present

## 2019-10-03 DIAGNOSIS — R3915 Urgency of urination: Secondary | ICD-10-CM | POA: Diagnosis not present

## 2019-10-04 ENCOUNTER — Other Ambulatory Visit: Payer: Self-pay | Admitting: Family Medicine

## 2019-10-04 DIAGNOSIS — F32A Depression, unspecified: Secondary | ICD-10-CM

## 2019-10-10 ENCOUNTER — Ambulatory Visit: Payer: PPO | Admitting: Family Medicine

## 2019-10-22 ENCOUNTER — Encounter: Payer: Self-pay | Admitting: Family Medicine

## 2019-10-22 ENCOUNTER — Ambulatory Visit (INDEPENDENT_AMBULATORY_CARE_PROVIDER_SITE_OTHER): Payer: Federal, State, Local not specified - PPO | Admitting: Family Medicine

## 2019-10-22 ENCOUNTER — Other Ambulatory Visit: Payer: Self-pay

## 2019-10-22 ENCOUNTER — Telehealth: Payer: Self-pay | Admitting: Family Medicine

## 2019-10-22 VITALS — BP 150/90 | HR 77 | Temp 98.1°F | Ht 70.0 in | Wt 258.0 lb

## 2019-10-22 DIAGNOSIS — G8929 Other chronic pain: Secondary | ICD-10-CM

## 2019-10-22 DIAGNOSIS — M545 Low back pain, unspecified: Secondary | ICD-10-CM

## 2019-10-22 DIAGNOSIS — Z9989 Dependence on other enabling machines and devices: Secondary | ICD-10-CM | POA: Diagnosis not present

## 2019-10-22 DIAGNOSIS — I10 Essential (primary) hypertension: Secondary | ICD-10-CM

## 2019-10-22 DIAGNOSIS — R5383 Other fatigue: Secondary | ICD-10-CM | POA: Diagnosis not present

## 2019-10-22 DIAGNOSIS — G720 Drug-induced myopathy: Secondary | ICD-10-CM | POA: Diagnosis not present

## 2019-10-22 DIAGNOSIS — R058 Other specified cough: Secondary | ICD-10-CM

## 2019-10-22 DIAGNOSIS — M25561 Pain in right knee: Secondary | ICD-10-CM

## 2019-10-22 DIAGNOSIS — G4733 Obstructive sleep apnea (adult) (pediatric): Secondary | ICD-10-CM

## 2019-10-22 DIAGNOSIS — M25562 Pain in left knee: Secondary | ICD-10-CM | POA: Diagnosis not present

## 2019-10-22 DIAGNOSIS — R05 Cough: Secondary | ICD-10-CM | POA: Diagnosis not present

## 2019-10-22 DIAGNOSIS — T466X5A Adverse effect of antihyperlipidemic and antiarteriosclerotic drugs, initial encounter: Secondary | ICD-10-CM | POA: Diagnosis not present

## 2019-10-22 DIAGNOSIS — R059 Cough, unspecified: Secondary | ICD-10-CM

## 2019-10-22 MED ORDER — ALBUTEROL SULFATE HFA 108 (90 BASE) MCG/ACT IN AERS: 1.0000 | INHALATION_SPRAY | RESPIRATORY_TRACT | 0 refills | Status: DC | PRN

## 2019-10-22 NOTE — Progress Notes (Addendum)
Subjective:  Patient ID: [name], male    DOB: Jan 24, 1951  Age: 69 y.o. MRN: [id number]  CC:  Chief Complaint  Patient presents with  . Hypertension    Pt reports hedosen't think his monitors are any good. Pt states he has had them caompaired to one another and even compaired them to his reading with our machine and the readings was off by 20 points. Pt states he feels fatiged sometimes,but no other physocal symptoms.  . Arthritis    Pt states he has bad arthritis in his Lower back and both knees. Pt states he has some bad pain in these areas that he takes tylonal for and it helps a little  per pt.    HPI FERGUS THRONE presents for   Hypertension: Complicated by history of CAD (stent in 2016 cath in 2017, unsuccessful PCI of OM1), paroxysmal atrial fibrillation, followed by cardiology, seen by Richardson Dopp 08/29/2019, then hypertension/hyperlipidemia clinic 09/25/2019. New medication of spironolactone 12.5 mg daily, continued on hydrochlorothiazide, losartan, hydralazine, metoprolol.  Blood pressure was improved at diabetes visit with endocrinology June 24. Low PAF burden with Holter in August 2017., followed by Dr. Rayann Heman, no need for anticoagulation.  Never started the spirinolactone.  Home readings: not reliable - his machine was not working right off when compared to in office machine  BP Readings from Last 3 Encounters:  10/22/19 (!) 150/90  09/27/19 130/80  09/25/19 (!) 164/82   Lab Results  Component Value Date   CREATININE 0.97 08/29/2019   Albuterol refill: Uses albuterol inhaler every few weeks. Uses for cough at times. Treated in the past for possible upper airway cough syndrome.  Recommended fluticasone nasal spray. Only using flonase every few weeks. Starts with drainage, then cough.    Fatigue Discussed in January, thought to have component of depression that was contributing.  Started on Zoloft 12.5 mg daily.  Was improving at his March visit with more  energy, less fatigue and not feeling depressed.  Does have a history of OSA and uses CPAP.  Regular sleep schedule was discussed at last visit. Some fatigue persists. Feels like sleeping ok - sleeps 3-4 hours without the CPAP, wakes up for 1-2 hours, then asleep again for 4 hours with using the CPAP (does not wake up prematurely on CPAP).  Tx: sleep ease otc in middle of night at times.  zoloft is working well - does not feel depressed.   Depression screen [facility] 2/9 10/22/2019 06/11/2019 04/30/2019 04/09/2019 03/15/2019  Decreased Interest 0 0 0 0 3  Down, Depressed, Hopeless 0 0 0 0 1  PHQ - 2 Score 0 0 0 0 4  Altered sleeping - - 1 - 3  Tired, decreased energy - - 2 - 3  Change in appetite - - 2 - 3  Feeling bad or failure about yourself  - - 0 - 0  Trouble concentrating - - 1 - 0  Moving slowly or fidgety/restless - - 0 - 0  Suicidal thoughts - - 0 - 0  PHQ-9 Score - - 6 - 13  Difficult doing work/chores - - - - Not difficult at all  Some recent data might be hidden   Low back pain History of degenerative disc disease, MRI in 2013. Same pain for years. No changes recently - helps with stretching or massage.  No changes in pain. Seen by Dr. Ron Agee in the past at [facility] ortho.  Tx: rare tylenol.   Bilateral knee pain Both  knees past 1 year. Told had bone on bone on R knee by ortho in past.  No injury. Working on cars - some soreness behind kneecap. . Had injections in past year. Dr. [name].  Pain worse past year - injections done there. Trouble getting on a bicycle - used to ride bike.  Tx: rare tylenol.   History Patient Active Problem List   Diagnosis Date Noted  . Statin myopathy 04/04/2019  . Statin intolerance -weakness, stiffness 02/13/2019  . Rupture of right supraspinatus tendon 01/18/2019  . Complex tear of medial meniscus of left knee 09/14/2018  . Malignant neoplasm of prostate ([place]) 01/04/2018  . Refusal of blood transfusions as patient is Jehovah's Witness   .  Pneumonia   . Osteoarthritis   . Obstructive sleep apnea   . Hyperlipidemia   . HTN (hypertension)   . GERD (gastroesophageal reflux disease)   . Depression   . Coronary artery disease   . Chronic lower back pain   . Chest pain   . Arthritis   . Anemia   . Benign essential HTN 01/03/2017  . PAT (paroxysmal atrial tachycardia) ([place]) 10/27/2015  . Type 2 diabetes mellitus with circulatory disorder, without long-term current use of insulin ([place]) 06/19/2015  . Angina pectoris ([place])   . Elevated PSA 12/20/2014  . CAD (coronary artery disease)   . Obstructive sleep apnea 09/06/2014  . Personal history of colonic polyps 12/13/2013  . Blood in stool 12/13/2013  . Prostatitis, acute 12/13/2013  . Insomnia 04/19/2013  . Obesity with body mass index of 30.0-39.9 10/18/2012  . Right lumbar radiculitis 09/29/2011  . Adenomatous colon polyp 04/05/2009  . ORGANIC IMPOTENCE 11/11/2008  . Hyperlipidemia LDL goal <70 08/23/2008  . GERD 08/23/2008  . BPH (benign prostatic hypertrophy) with urinary obstruction 08/23/2008  . OSTEOARTHRITIS 08/23/2008   Past Medical History:  Diagnosis Date  . Adenomatous colon polyp 2011  . Anemia   . Angina pectoris ([place]) 05/01/2015   med rx for 95% OM (u/a to access due to tortuosity), 95% D1 and other moderate CAD at cath  . Arthritis    "back, knees" (05/01/2015)  . Chest pain    pleuritic  . Chronic lower back pain   . Complex tear of medial meniscus of left knee 09/14/2018  . Coronary artery disease   . Depression   . GERD (gastroesophageal reflux disease)   . HTN (hypertension)   . Hyperlipidemia   . Obstructive sleep apnea    noncompliant with CPAP  . Osteoarthritis   . Pneumonia ~ 2014 X 1  . Prostate cancer ([place])   . Refusal of blood transfusions as patient is Jehovah's Witness   . Rotator cuff arthropathy, right   . Rupture of right supraspinatus tendon 01/18/2019  . Type II or unspecified type diabetes mellitus without mention of  complication, not stated as uncontrolled    Past Surgical History:  Procedure Laterality Date  . CARDIAC CATHETERIZATION  2004   patent coronary arteries  . CARDIAC CATHETERIZATION  05/01/2015   "tried to put stent in but couldn't"  . CARDIAC CATHETERIZATION N/A 05/01/2015   Procedure: Left Heart Cath and Coronary Angiography;  Surgeon: Belva Crome, MD; LAD 60%, oD1 90%, pD1 70%, D2 70%, CFX patent stent, 30% distal to prev stent, pRCA 20%, OM1 90/95%, NL LV  . CARDIAC CATHETERIZATION N/A 05/01/2015   Procedure: Coronary Stent Intervention;  Surgeon: Belva Crome, MD;  Unsuccessful PCI OM due to tortuosity  . CARDIAC CATHETERIZATION N/A  11/06/2014   Procedure: Left Heart Cath and Coronary Angiography;  Surgeon: Belva Crome, MD;  Location: [facility];  Service: Cardiovascular;  Laterality: N/A;  . CLOSED REDUCTION SHOULDER DISLOCATION Right ~ 1975   "& reattached muscle"  . COLONOSCOPY W/ BIOPSIES AND POLYPECTOMY  X 2  . ESOPHAGOGASTRODUODENOSCOPY ENDOSCOPY    . KNEE ARTHROSCOPY WITH MEDIAL MENISECTOMY Left 09/14/2018   Procedure: LEFT KNEE ARTHROSCOPY CHONDROPLASY,  WITH MEDIAL MENISECTOMY;  Surgeon: Marchia Bond, MD;  Location: [place];  Service: Orthopedics;  Laterality: Left;  . PILONIDAL CYST EXCISION    . SHOULDER ARTHROSCOPY WITH DEBRIDEMENT AND BICEP TENDON REPAIR Right 01/18/2019   Procedure: RIGHT SHOULDER ARTHROSCOPY WITH DEBRIDEMENT AND DECOMPRSSION SUBACROMIAL PARTIAL ACROMIOPLASTY;  Surgeon: Marchia Bond, MD;  Location: [place];  Service: Orthopedics;  Laterality: Right;  . SHOULDER ARTHROSCOPY WITH ROTATOR CUFF REPAIR Right 01/18/2019   Procedure: SHOULDER ARTHROSCOPY WITH ROTATOR CUFF REPAIR;  Surgeon: Marchia Bond, MD;  Location: [place];  Service: Orthopedics;  Laterality: Right;   Allergies  Allergen Reactions  . Statins Other (See Comments)    REACTION: joint pain Lipitor- headaches Has also tried  Livalo, pravachol, zetia, crestor, welchol  . Imdur [Isosorbide Dinitrate]     Headache - "violent"   Prior to Admission medications   Medication Sig Start Date End Date Taking? Authorizing Provider  albuterol (VENTOLIN HFA) 108 (90 Base) MCG/ACT inhaler Inhale 1-2 puffs into the lungs every 4 (four) hours as needed for wheezing or shortness of breath. 03/15/19  Yes Wendie Agreste, MD  aspirin EC 81 MG tablet Take 81 mg by mouth daily. Swallow whole.   Yes [provider]  Blood Glucose Monitoring Suppl (RELION PREMIER BLU MONITOR) DEVI 1 Device by Does not apply route daily. 07/12/16  Yes [name], MD  fluticasone (FLONASE) 50 MCG/ACT nasal spray PLACE 1-2 SPRAYS INTO BOTH NOSTRILS DAILY. 09/21/19  Yes Wendie Agreste, MD  glipiZIDE (GLUCOTROL XL) 5 MG 24 hr tablet TAKE 1 TABLET BY MOUTH TWICE A DAY 09/17/19  Yes [name], MD  glucose blood test strip Use as instructed to check sugar 2 times daily 07/12/16  Yes [name], MD  hydrALAZINE (APRESOLINE) 25 MG tablet Take 1 tablet (25 mg total) by mouth in the morning and at bedtime. 08/29/19  Yes Weaver, Scott T, PA-C  hydrochlorothiazide (HYDRODIURIL) 25 MG tablet TAKE 1 TABLET BY MOUTH EVERY DAY 05/25/19  Yes Belva Crome, MD  insulin detemir (LEVEMIR FLEXTOUCH) 100 UNIT/ML FlexPen INJECT 16 UNITS INTO THE SKIN DAILY AT 10 PM.(1 BOX =140 DAYS) 09/28/19  Yes [name], MD  Insulin Pen Needle (NOVOFINE PLUS) 32G X 4 MM MISC Use 1x a day 05/09/14  Yes [name], MD  losartan (COZAAR) 100 MG tablet TAKE 1 TABLET BY MOUTH EVERY DAY 05/25/19  Yes Belva Crome, MD  metFORMIN (GLUCOPHAGE) 500 MG tablet TAKE 2 TABLETS (1,000 MG TOTAL) BY MOUTH 2 (TWO) TIMES DAILY WITH A MEAL. 07/31/19  Yes [name], MD  metoprolol tartrate (LOPRESSOR) 50 MG tablet Take 1 tablet (50 mg total) by mouth 2 (two) times daily. 04/18/18  Yes Belva Crome, MD  nitroGLYCERIN (NITROSTAT) 0.4 MG SL tablet PLACE 1 TABLET UNDER  THE TONGUE EVERY 5 MIN X 3 DOSES AS NEEDED FOR CHEST PAIN 08/02/19  Yes Belva Crome, MD  pravastatin (PRAVACHOL) 40 MG tablet TAKE 1 TABLET ONCE DAILY 05/30/19  Yes Wendie Agreste, MD  Semaglutide,  1 MG/DOSE, 2 MG/1.5ML SOPN Inject into the skin once a week.   Yes [provider]  sertraline (ZOLOFT) 25 MG tablet TAKE 1/2 TABLET BY MOUTH EVERY DAY 10/04/19  Yes Wendie Agreste, MD  spironolactone (ALDACTONE) 25 MG tablet TAKE 1/2 TABLET BY MOUTH EVERY DAY Patient not taking: Reported on 10/22/2019 10/01/19   Belva Crome, MD   Social History   Socioeconomic History  . Marital status: Married    Spouse name: [name]  . Number of children: 4  . Years of education: Not on file  . Highest education level: Not on file  Occupational History  . Occupation: Retired    Fish farm manager: UNEMPLOYED  Tobacco Use  . Smoking status: Former Smoker    Packs/day: 0.50    Years: 10.00    Pack years: 5.00    Types: Cigarettes    Quit date: 04/06/1983    Years since quitting: 36.5  . Smokeless tobacco: Never Used  Vaping Use  . Vaping Use: Never used  Substance and Sexual Activity  . Alcohol use: Yes    Alcohol/week: 0.0 standard drinks    Comment: `/26/2017 "might drink a beer q couple months mostly; summertime I might drink 2-3 beers/week"  . Drug use: No  . Sexual activity: Not Currently  Other Topics Concern  . Not on file  Social History Narrative   Lives in [place] with spouse Tecumseh Yeagley).  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   Social Determinants of Health   Financial Resource Strain:   . Difficulty of Paying Living Expenses:   Food Insecurity:   . Worried About Charity fundraiser in the Last Year:   . Arboriculturist in the Last Year:   Transportation Needs:   . Film/video editor (Medical):   Marland Kitchen Lack of Transportation (Non-Medical):   Physical Activity:   . Days of Exercise per Week:   . Minutes of Exercise  per Session:   Stress:   . Feeling of Stress :   Social Connections:   . Frequency of Communication with Friends and Family:   . Frequency of Social Gatherings with Friends and Family:   . Attends Religious Services:   . Active Member of Clubs or Organizations:   . Attends Archivist Meetings:   Marland Kitchen Marital Status:   Intimate Partner Violence:   . Fear of Current or Ex-Partner:   . Emotionally Abused:   Marland Kitchen Physically Abused:   . Sexually Abused:     Review of Systems  Constitutional: Negative for fatigue and unexpected weight change.  Eyes: Negative for visual disturbance.  Respiratory: Negative for cough, chest tightness and shortness of breath.   Cardiovascular: Negative for chest pain, palpitations and leg swelling.  Gastrointestinal: Negative for abdominal pain and blood in stool.  Musculoskeletal: Positive for arthralgias (bil knees. ) and back pain.  Neurological: Negative for dizziness, light-headedness and headaches.   No bowel or bladder incontinence, no saddle anesthesia, no lower extremity weakness.    Objective:   Vitals:   10/22/19 1357 10/22/19 1505  BP: (!) 165/92 (!) 150/90  Pulse: 77   Temp: 98.1 F (36.7 C)   TempSrc: Temporal   SpO2: 99%   Weight: 258 lb (117 kg)   Height: 5\' 10"  (1.778 m)      Physical Exam Vitals reviewed.  Constitutional:      Appearance: He is well-developed.  HENT:  Head: Normocephalic and atraumatic.  Eyes:     Pupils: Pupils are equal, round, and reactive to light.  Neck:     Vascular: No carotid bruit or JVD.  Cardiovascular:     Rate and Rhythm: Normal rate and regular rhythm.     Heart sounds: Normal heart sounds. No murmur heard.   Pulmonary:     Effort: Pulmonary effort is normal.     Breath sounds: Normal breath sounds. No rales.  Musculoskeletal:     Comments: Lumbar spine flexion to 90 degrees, slight decreased lateral flexion bilaterally, rotation bilaterally.  Negative seated straight leg  raise.  Describes area of discomfort at paraspinal muscles, no midline bony tenderness.  Bilateral knees: No apparent effusion, full range of motion.  Minimal tenderness at the medial joint line bilaterally, minimal tenderness anterior knees bilaterally, minimal crepitance.  Skin intact, no erythema, no warmth.  Skin:    General: Skin is warm and dry.  Neurological:     General: No focal deficit present.     Mental Status: He is alert and oriented to person, place, and time.  Psychiatric:        Mood and Affect: Mood normal.        Behavior: Behavior normal.        Assessment & Plan:  CHANNIN AGUSTIN is a 69 y.o. male . Pain in both knees, unspecified chronicity  -Has previously been treated by orthopedics.  Including injections.  More anterior pain, could be more patellofemoral in nature.  Tylenol as needed for now, orthopedic follow-up recommended.  Cough - Plan: albuterol (VENTOLIN HFA) 108 (90 Base) MCG/ACT inhaler Upper airway cough syndrome  -Flonase daily recommended to see if that will lessen cough and need for albuterol.  Albuterol was refilled but RTC precautions given if frequent use.  Still suspect upper airway as primary cause of cough.  Chronic bilateral low back pain without sciatica  -Remote MRI results reviewed.  Suspected degenerative changes, chronic back pain, no red flags on exam/history.  Tylenol, symptomatic care and handout given with follow-up with orthopedic back specialist recommended.  Fatigue, unspecified type  -Possibly multifactorial, but did recommend consistent CPAP usage including early in the night.  RTC precautions  OSA on CPAP  -As above, potentially may be waking up after few hours without CPAP due to uncontrolled OSA symptoms.  Essential hypertension  Decreased control.  Recommended he start the half of spironolactone as recommended by cardiology, but if there are concerns regarding the medications and side effects, can discuss this further  with cardiology/hypertension clinic.   Statin induced myopathy Has not tolerated multiple statins, with arthralgias.  See allergy list Meds ordered this encounter  Medications  . albuterol (VENTOLIN HFA) 108 (90 Base) MCG/ACT inhaler    Sig: Inhale 1-2 puffs into the lungs every 4 (four) hours as needed for wheezing or shortness of breath.    Dispense:  18 g    Refill:  0   Patient Instructions    I do recommend starting the spirinolactone that was prescribed by cardiology to see if that may help. Please discuss any concerns with those medicines with your cardiology office.   Make sure to use CPAP machine ANYTIME when you go to sleep. If sleep apnea is treated better, blood pressure and fatigue may also improve.  No other med changes for now.  Try Tylenol few times per day as needed for back and knee pain. I do recommend following up with Dr. [name] about your  knee pain and options.   I would also recommend following up with Dr. Ron Agee about your back pain. See other info below. Tylenol a few times per day if needed.   Try fluticasone nasal spray daily, and see if lessens the need for albuterol. Follow up in next 6 weeks.  I did refill albuterol if needed for now.    Chronic Back Pain When back pain lasts longer than 3 months, it is called chronic back pain.The cause of your back pain may not be known. Some common causes include:  Wear and tear (degenerative disease) of the bones, ligaments, or disks in your back.  Inflammation and stiffness in your back (arthritis). People who have chronic back pain often go through certain periods in which the pain is more intense (flare-ups). Many people can learn to manage the pain with home care. Follow these instructions at home: Pay attention to any changes in your symptoms. Take these actions to help with your pain: Activity   Avoid bending and other activities that make the problem worse.  Maintain a proper position when standing  or sitting: ? When standing, keep your upper back and neck straight, with your shoulders pulled back. Avoid slouching. ? When sitting, keep your back straight and relax your shoulders. Do not round your shoulders or pull them backward.  Do not sit or stand in one place for long periods of time.  Take brief periods of rest throughout the day. This will reduce your pain. Resting in a lying or standing position is usually better than sitting to rest.  When you are resting for longer periods, mix in some mild activity or stretching between periods of rest. This will help to prevent stiffness and pain.  Get regular exercise. Ask your health care provider what activities are safe for you.  Do not lift anything that is heavier than 10 lb (4.5 kg). Always use proper lifting technique, which includes: ? Bending your knees. ? Keeping the load close to your body. ? Avoiding twisting.  Sleep on a firm mattress in a comfortable position. Try lying on your side with your knees slightly bent. If you lie on your back, put a pillow under your knees. Managing pain  If directed, apply ice to the painful area. Your health care provider may recommend applying ice during the first 24-48 hours after a flare-up begins. ? Put ice in a plastic bag. ? Place a towel between your skin and the bag. ? Leave the ice on for 20 minutes, 2-3 times per day.  If directed, apply heat to the affected area as often as told by your health care provider. Use the heat source that your health care provider recommends, such as a moist heat pack or a heating pad. ? Place a towel between your skin and the heat source. ? Leave the heat on for 20-30 minutes. ? Remove the heat if your skin turns bright red. This is especially important if you are unable to feel pain, heat, or cold. You may have a greater risk of getting burned.  Try soaking in a warm tub.  Take over-the-counter and prescription medicines only as told by your health  care provider.  Keep all follow-up visits as told by your health care provider. This is important. Contact a health care provider if:  You have pain that is not relieved with rest or medicine. Get help right away if:  You have weakness or numbness in one or both of your legs or  feet.  You have trouble controlling your bladder or your bowels.  You have nausea or vomiting.  You have pain in your abdomen.  You have shortness of breath or you faint. This information is not intended to replace advice given to you by your health care provider. Make sure you discuss any questions you have with your health care provider. Document Revised: 07/13/2018 Document Reviewed: 09/29/2016 Elsevier Patient Education  [employer].   If you have lab work done today you will be contacted with your lab results within the next 2 weeks.  If you have not heard from [place] then please contact us. The fastest way to get your results is to register for My Chart.   IF you received an x-ray today, you will receive an invoice from [facility]. Please contact [facility] at [phone] with questions or concerns regarding your invoice.   IF you received labwork today, you will receive an invoice from [facility]. Please contact [facility] at [phone] with questions or concerns regarding your invoice.   Our billing staff will not be able to assist you with questions regarding bills from these companies.  You will be contacted with the lab results as soon as they are available. The fastest way to get your results is to activate your My Chart account. Instructions are located on the last page of this paperwork. If you have not heard from [place] regarding the results in 2 weeks, please contact this office.         Signed, [name], MD [facility]

## 2019-10-22 NOTE — Patient Instructions (Addendum)
I do recommend starting the spirinolactone that was prescribed by cardiology to see if that may help. Please discuss any concerns with those medicines with your cardiology office.   Make sure to use CPAP machine ANYTIME when you go to sleep. If sleep apnea is treated better, blood pressure and fatigue may also improve.  No other med changes for now.  Try Tylenol few times per day as needed for back and knee pain. I do recommend following up with Dr. [name] about your knee pain and options.   I would also recommend following up with Dr. Ron Agee about your back pain. See other info below. Tylenol a few times per day if needed.   Try fluticasone nasal spray daily, and see if lessens the need for albuterol. Follow up in next 6 weeks.  I did refill albuterol if needed for now.    Chronic Back Pain When back pain lasts longer than 3 months, it is called chronic back pain.The cause of your back pain may not be known. Some common causes include:  Wear and tear (degenerative disease) of the bones, ligaments, or disks in your back.  Inflammation and stiffness in your back (arthritis). People who have chronic back pain often go through certain periods in which the pain is more intense (flare-ups). Many people can learn to manage the pain with home care. Follow these instructions at home: Pay attention to any changes in your symptoms. Take these actions to help with your pain: Activity   Avoid bending and other activities that make the problem worse.  Maintain a proper position when standing or sitting: ? When standing, keep your upper back and neck straight, with your shoulders pulled back. Avoid slouching. ? When sitting, keep your back straight and relax your shoulders. Do not round your shoulders or pull them backward.  Do not sit or stand in one place for long periods of time.  Take brief periods of rest throughout the day. This will reduce your pain. Resting in a lying or standing  position is usually better than sitting to rest.  When you are resting for longer periods, mix in some mild activity or stretching between periods of rest. This will help to prevent stiffness and pain.  Get regular exercise. Ask your health care provider what activities are safe for you.  Do not lift anything that is heavier than 10 lb (4.5 kg). Always use proper lifting technique, which includes: ? Bending your knees. ? Keeping the load close to your body. ? Avoiding twisting.  Sleep on a firm mattress in a comfortable position. Try lying on your side with your knees slightly bent. If you lie on your back, put a pillow under your knees. Managing pain  If directed, apply ice to the painful area. Your health care provider may recommend applying ice during the first 24-48 hours after a flare-up begins. ? Put ice in a plastic bag. ? Place a towel between your skin and the bag. ? Leave the ice on for 20 minutes, 2-3 times per day.  If directed, apply heat to the affected area as often as told by your health care provider. Use the heat source that your health care provider recommends, such as a moist heat pack or a heating pad. ? Place a towel between your skin and the heat source. ? Leave the heat on for 20-30 minutes. ? Remove the heat if your skin turns bright red. This is especially important if you are unable to feel pain,  heat, or cold. You may have a greater risk of getting burned.  Try soaking in a warm tub.  Take over-the-counter and prescription medicines only as told by your health care provider.  Keep all follow-up visits as told by your health care provider. This is important. Contact a health care provider if:  You have pain that is not relieved with rest or medicine. Get help right away if:  You have weakness or numbness in one or both of your legs or feet.  You have trouble controlling your bladder or your bowels.  You have nausea or vomiting.  You have pain in your  abdomen.  You have shortness of breath or you faint. This information is not intended to replace advice given to you by your health care provider. Make sure you discuss any questions you have with your health care provider. Document Revised: 07/13/2018 Document Reviewed: 09/29/2016 Elsevier Patient Education  [employer].   If you have lab work done today you will be contacted with your lab results within the next 2 weeks.  If you have not heard from [place] then please contact us. The fastest way to get your results is to register for My Chart.   IF you received an x-ray today, you will receive an invoice from [facility]. Please contact [facility] at [phone] with questions or concerns regarding your invoice.   IF you received labwork today, you will receive an invoice from [facility]. Please contact [facility] at [phone] with questions or concerns regarding your invoice.   Our billing staff will not be able to assist you with questions regarding bills from these companies.  You will be contacted with the lab results as soon as they are available. The fastest way to get your results is to activate your My Chart account. Instructions are located on the last page of this paperwork. If you have not heard from [place] regarding the results in 2 weeks, please contact this office.

## 2019-10-22 NOTE — Telephone Encounter (Signed)
Patient is already in office to be seen. Received msg too late

## 2019-10-22 NOTE — Telephone Encounter (Signed)
Patient is calling to come earlier for his appt. Patient is interested in taking the 1:40p. Appt. Thank you. CB- [phone]

## 2019-10-23 ENCOUNTER — Encounter: Payer: Self-pay | Admitting: Family Medicine

## 2019-11-01 ENCOUNTER — Other Ambulatory Visit: Payer: Self-pay | Admitting: Interventional Cardiology

## 2019-11-12 NOTE — Telephone Encounter (Signed)
Please call the patient to review his symptoms to determine if he needs to go to the ED, needs urgent follow up or can be scheduled routinely.  He has not seen Dr. Tamala Julian since 04/2018.  He has seen several other providers since.  I saw him last.  It would be nice to get him in to see Dr. Tamala Julian or APP on his team.  If he needs urgent follow up and there is no availability, since I saw him last, I can see him.  If he sees an APP and if it is possible, please schedule on a day Dr. Tamala Julian is there.  If that is not possible, that is ok.  I just think it is always good to have the primary MD available if there are significant decisions that need to be made.  Richardson Dopp, PA-C    11/12/2019 8:51 AM

## 2019-11-12 NOTE — Telephone Encounter (Signed)
Call returned to Pt.  He states he has noticed increased shortness of breath and fatigue.  He states these are newer issues.  States when he "pushes himself or rushes" he has increased gas.    He is concerned about these issues.  States "my uncle has a pacemaker".  Concerned he may have an issue with his heart.  After speaking with Pt, do not think he needs to go to ER or be seen urgently.  DO recommend he see Dr. Tamala Julian.  Will schedule to see Dr. Tamala Julian August 19 or 20.

## 2019-11-21 NOTE — Progress Notes (Signed)
Cardiology Office Note:    Date:  11/22/2019   ID:  [name], DOB 07/21/1950, MRN [id number]  PCP:  Wendie Agreste, MD  Cardiologist:  [name], MD   Referring MD: Wendie Agreste, MD   Chief Complaint  Patient presents with  . Coronary Artery Disease  . Hypertension  . Hyperlipidemia    History of Present Illness:    [name] is a 69 y.o. male with a hx of CAD s/p DES to LCx 11/2014, OM1 not stented due to inability of stent to track (likely due to wire tangle), HTN, HLD, OSA not on CAPA and PAT/PAF (not on anticoagulation due to low burden),   Activity, excitement, and stress associated with physical activity can cause a pressure-like sensation in the chest that then leads to burping to relieve the discomfort.  It usually happens within the first 10 minutes of activity.  Once it resolves, he is able to participate in the activity without any recurrence of the discomfort.  This is been going on for months.  It does not occur at rest.  He was on Repatha but the medication was discontinued because he became "weak".  Past Medical History:  Diagnosis Date  . Adenomatous colon polyp 2011  . Anemia   . Angina pectoris ([place]) 05/01/2015   med rx for 95% OM (u/a to access due to tortuosity), 95% D1 and other moderate CAD at cath  . Arthritis    "back, knees" (05/01/2015)  . Chest pain    pleuritic  . Chronic lower back pain   . Complex tear of medial meniscus of left knee 09/14/2018  . Coronary artery disease   . Depression   . GERD (gastroesophageal reflux disease)   . HTN (hypertension)   . Hyperlipidemia   . Obstructive sleep apnea    noncompliant with CPAP  . Osteoarthritis   . Pneumonia ~ 2014 X 1  . Prostate cancer ([place])   . Refusal of blood transfusions as patient is Jehovah's Witness   . Rotator cuff arthropathy, right   . Rupture of right supraspinatus tendon 01/18/2019  . Type II or unspecified type diabetes mellitus without mention of  complication, not stated as uncontrolled     Past Surgical History:  Procedure Laterality Date  . CARDIAC CATHETERIZATION  2004   patent coronary arteries  . CARDIAC CATHETERIZATION  05/01/2015   "tried to put stent in but couldn't"  . CARDIAC CATHETERIZATION N/A 05/01/2015   Procedure: Left Heart Cath and Coronary Angiography;  Surgeon: Belva Crome, MD; LAD 60%, oD1 90%, pD1 70%, D2 70%, CFX patent stent, 30% distal to prev stent, pRCA 20%, OM1 90/95%, NL LV  . CARDIAC CATHETERIZATION N/A 05/01/2015   Procedure: Coronary Stent Intervention;  Surgeon: Belva Crome, MD;  Unsuccessful PCI OM due to tortuosity  . CARDIAC CATHETERIZATION N/A 11/06/2014   Procedure: Left Heart Cath and Coronary Angiography;  Surgeon: Belva Crome, MD;  Location: [place] [facility];  Service: Cardiovascular;  Laterality: N/A;  . CLOSED REDUCTION SHOULDER DISLOCATION Right ~ 1975   "& reattached muscle"  . COLONOSCOPY W/ BIOPSIES AND POLYPECTOMY  X 2  . ESOPHAGOGASTRODUODENOSCOPY ENDOSCOPY    . KNEE ARTHROSCOPY WITH MEDIAL MENISECTOMY Left 09/14/2018   Procedure: LEFT KNEE ARTHROSCOPY CHONDROPLASY,  WITH MEDIAL MENISECTOMY;  Surgeon: Marchia Bond, MD;  Location: [facility];  Service: Orthopedics;  Laterality: Left;  . PILONIDAL CYST EXCISION    . SHOULDER ARTHROSCOPY WITH  DEBRIDEMENT AND BICEP TENDON REPAIR Right 01/18/2019   Procedure: RIGHT SHOULDER ARTHROSCOPY WITH DEBRIDEMENT AND DECOMPRSSION SUBACROMIAL PARTIAL ACROMIOPLASTY;  Surgeon: Marchia Bond, MD;  Location: [facility];  Service: Orthopedics;  Laterality: Right;  . SHOULDER ARTHROSCOPY WITH ROTATOR CUFF REPAIR Right 01/18/2019   Procedure: SHOULDER ARTHROSCOPY WITH ROTATOR CUFF REPAIR;  Surgeon: Marchia Bond, MD;  Location: [facility];  Service: Orthopedics;  Laterality: Right;    Current Medications: Current Meds  Medication Sig  . albuterol (VENTOLIN HFA) 108 (90 Base) MCG/ACT inhaler Inhale 1-2  puffs into the lungs every 4 (four) hours as needed for wheezing or shortness of breath.  Marland Kitchen aspirin EC 81 MG tablet Take 81 mg by mouth daily. Swallow whole.  . Blood Glucose Monitoring Suppl (RELION PREMIER BLU MONITOR) DEVI 1 Device by Does not apply route daily.  . fluticasone (FLONASE) 50 MCG/ACT nasal spray PLACE 1-2 SPRAYS INTO BOTH NOSTRILS DAILY.  Marland Kitchen glipiZIDE (GLUCOTROL XL) 5 MG 24 hr tablet TAKE 1 TABLET BY MOUTH TWICE A DAY  . glucose blood test strip Use as instructed to check sugar 2 times daily  . hydrALAZINE (APRESOLINE) 25 MG tablet Take 1 tablet (25 mg total) by mouth in the morning and at bedtime.  . hydrochlorothiazide (HYDRODIURIL) 25 MG tablet TAKE 1 TABLET BY MOUTH EVERY DAY  . insulin detemir (LEVEMIR FLEXTOUCH) 100 UNIT/ML FlexPen INJECT 16 UNITS INTO THE SKIN DAILY AT 10 PM.(1 BOX =140 DAYS)  . Insulin Pen Needle (NOVOFINE PLUS) 32G X 4 MM MISC Use 1x a day  . losartan (COZAAR) 100 MG tablet TAKE 1 TABLET BY MOUTH EVERY DAY  . metFORMIN (GLUCOPHAGE) 500 MG tablet TAKE 2 TABLETS (1,000 MG TOTAL) BY MOUTH 2 (TWO) TIMES DAILY WITH A MEAL.  . metoprolol tartrate (LOPRESSOR) 50 MG tablet TAKE 1 TABLET BY MOUTH TWICE A DAY  . nitroGLYCERIN (NITROSTAT) 0.4 MG SL tablet PLACE 1 TABLET UNDER THE TONGUE EVERY 5 MIN X 3 DOSES AS NEEDED FOR CHEST PAIN  . pravastatin (PRAVACHOL) 40 MG tablet TAKE 1 TABLET ONCE DAILY  . Semaglutide, 1 MG/DOSE, 2 MG/1.5ML SOPN Inject into the skin once a week.  . sertraline (ZOLOFT) 25 MG tablet TAKE 1/2 TABLET BY MOUTH EVERY DAY     Allergies:   Statins and Imdur [isosorbide dinitrate]   Social History   Socioeconomic History  . Marital status: Married    Spouse name: [name]  . Number of children: 4  . Years of education: Not on file  . Highest education level: Not on file  Occupational History  . Occupation: Retired    Fish farm manager: UNEMPLOYED  Tobacco Use  . Smoking status: Former Smoker    Packs/day: 0.50    Years: 10.00    Pack years:  5.00    Types: Cigarettes    Quit date: 04/06/1983    Years since quitting: 36.6  . Smokeless tobacco: Never Used  Vaping Use  . Vaping Use: Never used  Substance and Sexual Activity  . Alcohol use: Yes    Alcohol/week: 0.0 standard drinks    Comment: `/26/2017 "might drink a beer q couple months mostly; summertime I might drink 2-3 beers/week"  . Drug use: No  . Sexual activity: Not Currently  Other Topics Concern  . Not on file  Social History Narrative   Lives in [place] with spouse Darrek Leasure).  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   Social Determinants  of Health   Financial Resource Strain:   . Difficulty of Paying Living Expenses: Not on file  Food Insecurity:   . Worried About Charity fundraiser in the Last Year: Not on file  . Ran Out of Food in the Last Year: Not on file  Transportation Needs:   . Lack of Transportation (Medical): Not on file  . Lack of Transportation (Non-Medical): Not on file  Physical Activity:   . Days of Exercise per Week: Not on file  . Minutes of Exercise per Session: Not on file  Stress:   . Feeling of Stress : Not on file  Social Connections:   . Frequency of Communication with Friends and Family: Not on file  . Frequency of Social Gatherings with Friends and Family: Not on file  . Attends Religious Services: Not on file  . Active Member of Clubs or Organizations: Not on file  . Attends Archivist Meetings: Not on file  . Marital Status: Not on file     Family History: The patient's family history includes Alcohol abuse in an other family member; Colon cancer in his paternal uncle; Coronary artery disease in his mother and another family member; Diabetes in his maternal uncle, mother, paternal uncle, and sister; Heart disease in his mother; Other in his father; Stroke in his mother. There is no history of Colon polyps or Esophageal cancer.  ROS:   Please see the  history of present illness.    Currently on Repatha.  Could not tolerate isosorbide dinitrate.  Activity/lifestyle is heavily sedentary.  All other systems reviewed and are negative.  EKGs/Labs/Other Studies Reviewed:    The following studies were reviewed today: No new data  EKG:  EKG normal sinus rhythm, QS pattern V1 through V3, left axis deviation, first-degree AV block. When compared to the tracing performed in May 2021 no changes noted.  Recent Labs: 04/09/2019: Hemoglobin 14.6; Platelets 186; TSH 1.210 08/29/2019: ALT 17; BUN 11; Creatinine, Ser 0.97; Potassium 4.2; Sodium 144  Recent Lipid Panel    Component Value Date/Time   CHOL 154 08/29/2019 1631   TRIG 78 08/29/2019 1631   HDL 38 (L) 08/29/2019 1631   CHOLHDL 4.1 08/29/2019 1631   CHOLHDL 4 06/19/2015 1624   VLDL 12.2 06/19/2015 1624   LDLCALC 101 (H) 08/29/2019 1631   LDLDIRECT 131 (H) 03/22/2019 1427   LDLDIRECT 168.0 12/18/2012 1340    Physical Exam:    VS:  BP (!) 148/80   Pulse 68   Ht 5\' 10"  (1.778 m)   Wt 254 lb 3.2 oz (115.3 kg)   SpO2 98%   BMI 36.47 kg/m     Wt Readings from Last 3 Encounters:  11/22/19 254 lb 3.2 oz (115.3 kg)  10/22/19 258 lb (117 kg)  09/27/19 254 lb (115.2 kg)     GEN: Obese. No acute distress HEENT: Normal NECK: No JVD. LYMPHATICS: No lymphadenopathy CARDIAC:  RRR without murmur, gallop, but with bilateral shin to ankle edema. VASCULAR:  Normal Pulses. No bruits. RESPIRATORY:  Clear to auscultation without rales, wheezing or rhonchi  ABDOMEN: Soft, non-tender, non-distended, No pulsatile mass, MUSCULOSKELETAL: No deformity  SKIN: Warm and dry NEUROLOGIC:  Alert and oriented x 3 PSYCHIATRIC:  Normal affect   ASSESSMENT:    1. Coronary artery disease involving native coronary artery of native heart without angina pectoris   2. Hyperlipidemia LDL goal <70   3. Essential (primary) hypertension   4. Obstructive sleep apnea   5. Type  2 diabetes mellitus with other  circulatory complication, with long-term current use of insulin ([place])   6. PAF (paroxysmal atrial fibrillation) ([place])   7. Educated about COVID-19 virus infection    PLAN:    In order of problems listed above:  1. Secondary prevention discussed.  He is falling short of the physical activity target of 150 minutes.  I encouraged that he increase his activity.  Also encouraged him to take a sublingual nitroglycerin prior to doing heavy physical activity.  We will start low-dose Imdur 30 mg/day and try to increase to 60 mg/day if he can tolerate. 2. Target LDL should be less than 70.  Most recent total cholesterol is 154 in May.  He is now off PCSK9 therapy. 3. Blood pressure target should be 130/80.  Blood pressure is elevated today.  Long-acting nitrates may help to control.  May need to intensify antihypertensive regimen to improve angina.  If he is unable to tolerate isosorbide, consider adding amlodipine.  Monitor blood pressures at home and notify us. 4. CPAP use is sporadic. 5. A1c target less than 7.  Consider SGLT2 therapy. 6. No recent atrial fibrillation. 7. COVID-19 vaccination has been received.  Overall education and awareness concerning primary/secondary risk prevention was discussed in detail: LDL less than 70, hemoglobin A1c less than 7, blood pressure target less than 130/80 mmHg, >150 minutes of moderate aerobic activity per week, avoidance of smoking, weight control (via diet and exercise), and continued surveillance/management of/for obstructive sleep apnea.    Medication Adjustments/Labs and Tests Ordered: Current medicines are reviewed at length with the patient today.  Concerns regarding medicines are outlined above.  Orders Placed This Encounter  Procedures  . EKG 12-Lead   No orders of the defined types were placed in this encounter.   There are no Patient Instructions on file for this visit.   Signed, [name], MD  11/22/2019 11:08 AM    [place]

## 2019-11-22 ENCOUNTER — Other Ambulatory Visit: Payer: Self-pay

## 2019-11-22 ENCOUNTER — Ambulatory Visit (INDEPENDENT_AMBULATORY_CARE_PROVIDER_SITE_OTHER): Payer: Federal, State, Local not specified - PPO | Admitting: Interventional Cardiology

## 2019-11-22 ENCOUNTER — Encounter: Payer: Self-pay | Admitting: Interventional Cardiology

## 2019-11-22 VITALS — BP 148/80 | HR 68 | Ht 70.0 in | Wt 254.2 lb

## 2019-11-22 DIAGNOSIS — I10 Essential (primary) hypertension: Secondary | ICD-10-CM | POA: Diagnosis not present

## 2019-11-22 DIAGNOSIS — Z7189 Other specified counseling: Secondary | ICD-10-CM

## 2019-11-22 DIAGNOSIS — I2510 Atherosclerotic heart disease of native coronary artery without angina pectoris: Secondary | ICD-10-CM

## 2019-11-22 DIAGNOSIS — Z794 Long term (current) use of insulin: Secondary | ICD-10-CM

## 2019-11-22 DIAGNOSIS — G4733 Obstructive sleep apnea (adult) (pediatric): Secondary | ICD-10-CM | POA: Diagnosis not present

## 2019-11-22 DIAGNOSIS — E1159 Type 2 diabetes mellitus with other circulatory complications: Secondary | ICD-10-CM | POA: Diagnosis not present

## 2019-11-22 DIAGNOSIS — I480 Paroxysmal atrial fibrillation: Secondary | ICD-10-CM | POA: Diagnosis not present

## 2019-11-22 DIAGNOSIS — E785 Hyperlipidemia, unspecified: Secondary | ICD-10-CM | POA: Diagnosis not present

## 2019-11-22 MED ORDER — ISOSORBIDE MONONITRATE ER 60 MG PO TB24
60.0000 mg | ORAL_TABLET | Freq: Every day | ORAL | 3 refills | Status: DC
Start: 2019-11-22 — End: 2020-05-27

## 2019-11-22 NOTE — Patient Instructions (Signed)
Medication Instructions:  1) START Imdur 30mg  once daily for 2 weeks, then increase to 60mg  once daily.  Monitor your blood pressure daily, at least 2 hours after medicaitons, and send [place] those readings through MyChart or call the office.    *If you need a refill on your cardiac medications before your next appointment, please call your pharmacy*   Lab Work: None If you have labs (blood work) drawn today and your tests are completely normal, you will receive your results only by: Marland Kitchen MyChart Message (if you have MyChart) OR . A paper copy in the mail If you have any lab test that is abnormal or we need to change your treatment, we will call you to review the results.   Testing/Procedures: None   Follow-Up: At [facility], you and your health needs are our priority.  As part of our continuing mission to provide you with exceptional heart care, we have created designated Provider Care Teams.  These Care Teams include your primary Cardiologist (physician) and Advanced Practice Providers (APPs -  Physician Assistants and Nurse Practitioners) who all work together to provide you with the care you need, when you need it.  We recommend signing up for the patient portal called "MyChart".  Sign up information is provided on this After Visit Summary.  MyChart is used to connect with patients for Virtual Visits (Telemedicine).  Patients are able to view lab/test results, encounter notes, upcoming appointments, etc.  Non-urgent messages can be sent to your provider as well.   To learn more about what you can do with MyChart, go to [email or web address].    Your next appointment:   6 month(s)  The format for your next appointment:   In Person  Provider:   You may see [name], MD or one of the following Advanced Practice Providers on your designated Care Team:    Truitt Merle, NP  [name], NP  Kathyrn Drown, NP    Other Instructions

## 2019-12-03 ENCOUNTER — Ambulatory Visit: Payer: Federal, State, Local not specified - PPO | Admitting: Family Medicine

## 2019-12-13 DIAGNOSIS — G4733 Obstructive sleep apnea (adult) (pediatric): Secondary | ICD-10-CM | POA: Diagnosis not present

## 2019-12-17 ENCOUNTER — Ambulatory Visit: Payer: Federal, State, Local not specified - PPO | Admitting: Family Medicine

## 2019-12-28 ENCOUNTER — Ambulatory Visit (INDEPENDENT_AMBULATORY_CARE_PROVIDER_SITE_OTHER): Payer: Federal, State, Local not specified - PPO | Admitting: Family Medicine

## 2019-12-28 ENCOUNTER — Other Ambulatory Visit: Payer: Self-pay

## 2019-12-28 ENCOUNTER — Encounter: Payer: Self-pay | Admitting: Family Medicine

## 2019-12-28 VITALS — BP 146/82 | HR 80 | Temp 98.1°F | Ht 70.0 in | Wt 255.0 lb

## 2019-12-28 DIAGNOSIS — G4733 Obstructive sleep apnea (adult) (pediatric): Secondary | ICD-10-CM

## 2019-12-28 DIAGNOSIS — R410 Disorientation, unspecified: Secondary | ICD-10-CM | POA: Diagnosis not present

## 2019-12-28 DIAGNOSIS — F329 Major depressive disorder, single episode, unspecified: Secondary | ICD-10-CM | POA: Diagnosis not present

## 2019-12-28 DIAGNOSIS — R413 Other amnesia: Secondary | ICD-10-CM | POA: Diagnosis not present

## 2019-12-28 DIAGNOSIS — Z23 Encounter for immunization: Secondary | ICD-10-CM

## 2019-12-28 DIAGNOSIS — F32A Depression, unspecified: Secondary | ICD-10-CM

## 2019-12-28 DIAGNOSIS — R5383 Other fatigue: Secondary | ICD-10-CM

## 2019-12-28 DIAGNOSIS — Z9989 Dependence on other enabling machines and devices: Secondary | ICD-10-CM | POA: Diagnosis not present

## 2019-12-28 MED ORDER — SERTRALINE HCL 25 MG PO TABS: 25.0000 mg | ORAL_TABLET | Freq: Every day | ORAL | 1 refills | Status: DC

## 2019-12-28 NOTE — Progress Notes (Signed)
Subjective:  Patient ID: [name], male    DOB: 1950-07-29  Age: 69 y.o. MRN: [id number]  CC:  Chief Complaint  Patient presents with  . Follow-up    on fatigue. Pt reports it has gotten a little better since last OV, but it still comes and goes. pt reports no other concersnat this time.  . confussion    Pt reports conserns that he was driving one day and just forgot where he was for a momment even though it was an area he knows. pt was worried about possible dementia. since his mother had it before she past.    HPI SULLY DYMENT presents for   Fatigue: Discussed in July.  Possibly multifactorial at that time but did recommend consistent CPAP usage including early in the night. Had been falling asleep a few hours prior to going to bed and using cpap.   Overall fatigue has somewhat improved. Using cpap more consistently, still falling asleep without putting in on at times. Stopped repatha d/t feeling weak - feels better off this meds. Did not tolerate statins prior.    Confusion: Few weeks ago - feeling confused.  Specifically had 1 day where he was driving and forgot where he was, felt like in dream state. Was in area where he was familiar.  Felt this way for about 5 mins, then resolved. No recurrence. Some trouble remembering at times, turning off the faucet at times, mixing up days of appointments - feels like worse past 6 months.  Has some stress with children. Feels down at times, depressed feeling at times past year or so.  No suicidal ideation. Started on low dose zoloft in January, initially felt better, not sure recently.  35 yrs ago hospitalized for depression.  Does have concerns of dementia as mother had dementia.  Depression screen [facility] 2/9 10/22/2019 06/11/2019 04/30/2019 04/09/2019 03/15/2019  Decreased Interest 0 0 0 0 3  Down, Depressed, Hopeless 0 0 0 0 1  PHQ - 2 Score 0 0 0 0 4  Altered sleeping - - 1 - 3  Tired, decreased energy - - 2 - 3  Change in appetite -  - 2 - 3  Feeling bad or failure about yourself  - - 0 - 0  Trouble concentrating - - 1 - 0  Moving slowly or fidgety/restless - - 0 - 0  Suicidal thoughts - - 0 - 0  PHQ-9 Score - - 6 - 13  Difficult doing work/chores - - - - Not difficult at all  Some recent data might be hidden      6CIT Screen 12/28/2019  What Year? 0 points  What month? 0 points  What time? 0 points  Count back from 20 0 points  Months in reverse 0 points  Repeat phrase 4 points  Total Score 4       History Patient Active Problem List   Diagnosis Date Noted  . Statin myopathy 04/04/2019  . Statin intolerance -weakness, stiffness 02/13/2019  . Rupture of right supraspinatus tendon 01/18/2019  . Complex tear of medial meniscus of left knee 09/14/2018  . Malignant neoplasm of prostate ([place]) 01/04/2018  . Refusal of blood transfusions as patient is Jehovah's Witness   . Pneumonia   . Osteoarthritis   . Obstructive sleep apnea   . Hyperlipidemia   . HTN (hypertension)   . GERD (gastroesophageal reflux disease)   . Depression   . Coronary artery disease   . Chronic lower back  pain   . Chest pain   . Arthritis   . Anemia   . Benign essential HTN 01/03/2017  . PAT (paroxysmal atrial tachycardia) ([place]) 10/27/2015  . Type 2 diabetes mellitus with circulatory disorder, without long-term current use of insulin ([place]) 06/19/2015  . Angina pectoris ([place])   . Elevated PSA 12/20/2014  . CAD (coronary artery disease)   . Obstructive sleep apnea 09/06/2014  . Personal history of colonic polyps 12/13/2013  . Blood in stool 12/13/2013  . Prostatitis, acute 12/13/2013  . Insomnia 04/19/2013  . Obesity with body mass index of 30.0-39.9 10/18/2012  . Right lumbar radiculitis 09/29/2011  . Adenomatous colon polyp 04/05/2009  . ORGANIC IMPOTENCE 11/11/2008  . Hyperlipidemia LDL goal <70 08/23/2008  . GERD 08/23/2008  . BPH (benign prostatic hypertrophy) with urinary obstruction 08/23/2008  . OSTEOARTHRITIS  08/23/2008   Past Medical History:  Diagnosis Date  . Adenomatous colon polyp 2011  . Anemia   . Angina pectoris ([place]) 05/01/2015   med rx for 95% OM (u/a to access due to tortuosity), 95% D1 and other moderate CAD at cath  . Arthritis    "back, knees" (05/01/2015)  . Chest pain    pleuritic  . Chronic lower back pain   . Complex tear of medial meniscus of left knee 09/14/2018  . Coronary artery disease   . Depression   . GERD (gastroesophageal reflux disease)   . HTN (hypertension)   . Hyperlipidemia   . Obstructive sleep apnea    noncompliant with CPAP  . Osteoarthritis   . Pneumonia ~ 2014 X 1  . Prostate cancer ([place])   . Refusal of blood transfusions as patient is Jehovah's Witness   . Rotator cuff arthropathy, right   . Rupture of right supraspinatus tendon 01/18/2019  . Type II or unspecified type diabetes mellitus without mention of complication, not stated as uncontrolled    Past Surgical History:  Procedure Laterality Date  . CARDIAC CATHETERIZATION  2004   patent coronary arteries  . CARDIAC CATHETERIZATION  05/01/2015   "tried to put stent in but couldn't"  . CARDIAC CATHETERIZATION N/A 05/01/2015   Procedure: Left Heart Cath and Coronary Angiography;  Surgeon: Belva Crome, MD; LAD 60%, oD1 90%, pD1 70%, D2 70%, CFX patent stent, 30% distal to prev stent, pRCA 20%, OM1 90/95%, NL LV  . CARDIAC CATHETERIZATION N/A 05/01/2015   Procedure: Coronary Stent Intervention;  Surgeon: Belva Crome, MD;  Unsuccessful PCI OM due to tortuosity  . CARDIAC CATHETERIZATION N/A 11/06/2014   Procedure: Left Heart Cath and Coronary Angiography;  Surgeon: Belva Crome, MD;  Location: [facility];  Service: Cardiovascular;  Laterality: N/A;  . CLOSED REDUCTION SHOULDER DISLOCATION Right ~ 1975   "& reattached muscle"  . COLONOSCOPY W/ BIOPSIES AND POLYPECTOMY  X 2  . ESOPHAGOGASTRODUODENOSCOPY ENDOSCOPY    . KNEE ARTHROSCOPY WITH MEDIAL MENISECTOMY Left 09/14/2018   Procedure:  LEFT KNEE ARTHROSCOPY CHONDROPLASY,  WITH MEDIAL MENISECTOMY;  Surgeon: Marchia Bond, MD;  Location: [facility];  Service: Orthopedics;  Laterality: Left;  . PILONIDAL CYST EXCISION    . SHOULDER ARTHROSCOPY WITH DEBRIDEMENT AND BICEP TENDON REPAIR Right 01/18/2019   Procedure: RIGHT SHOULDER ARTHROSCOPY WITH DEBRIDEMENT AND DECOMPRSSION SUBACROMIAL PARTIAL ACROMIOPLASTY;  Surgeon: Marchia Bond, MD;  Location: [facility];  Service: Orthopedics;  Laterality: Right;  . SHOULDER ARTHROSCOPY WITH ROTATOR CUFF REPAIR Right 01/18/2019   Procedure: SHOULDER ARTHROSCOPY WITH ROTATOR CUFF REPAIR;  Surgeon: [name],  Vonna Kotyk, MD;  Location: [facility];  Service: Orthopedics;  Laterality: Right;   Allergies  Allergen Reactions  . Statins Other (See Comments)    REACTION: joint pain Lipitor- headaches Has also tried Livalo, pravachol, zetia, crestor, welchol  . Imdur [Isosorbide Dinitrate]     Headache - "violent"   Prior to Admission medications   Medication Sig Start Date End Date Taking? Authorizing Provider  albuterol (VENTOLIN HFA) 108 (90 Base) MCG/ACT inhaler Inhale 1-2 puffs into the lungs every 4 (four) hours as needed for wheezing or shortness of breath. 10/22/19  Yes Wendie Agreste, MD  aspirin EC 81 MG tablet Take 81 mg by mouth daily. Swallow whole.   Yes [provider]  Blood Glucose Monitoring Suppl (RELION PREMIER BLU MONITOR) DEVI 1 Device by Does not apply route daily. 07/12/16  Yes [name], MD  fluticasone (FLONASE) 50 MCG/ACT nasal spray PLACE 1-2 SPRAYS INTO BOTH NOSTRILS DAILY. 09/21/19  Yes Wendie Agreste, MD  glipiZIDE (GLUCOTROL XL) 5 MG 24 hr tablet TAKE 1 TABLET BY MOUTH TWICE A DAY 09/17/19  Yes [name], MD  glucose blood test strip Use as instructed to check sugar 2 times daily 07/12/16  Yes [name], MD  hydrALAZINE (APRESOLINE) 25 MG tablet Take 1 tablet (25 mg total) by mouth in the morning  and at bedtime. 08/29/19  Yes Weaver, Scott T, PA-C  hydrochlorothiazide (HYDRODIURIL) 25 MG tablet TAKE 1 TABLET BY MOUTH EVERY DAY 05/25/19  Yes Belva Crome, MD  insulin detemir (LEVEMIR FLEXTOUCH) 100 UNIT/ML FlexPen INJECT 16 UNITS INTO THE SKIN DAILY AT 10 PM.(1 BOX =140 DAYS) 09/28/19  Yes [name], MD  Insulin Pen Needle (NOVOFINE PLUS) 32G X 4 MM MISC Use 1x a day 05/09/14  Yes [name], MD  isosorbide mononitrate (IMDUR) 60 MG 24 hr tablet Take 1 tablet (60 mg total) by mouth daily. 11/22/19  Yes Belva Crome, MD  losartan (COZAAR) 100 MG tablet TAKE 1 TABLET BY MOUTH EVERY DAY 05/25/19  Yes Belva Crome, MD  metFORMIN (GLUCOPHAGE) 500 MG tablet TAKE 2 TABLETS (1,000 MG TOTAL) BY MOUTH 2 (TWO) TIMES DAILY WITH A MEAL. 07/31/19  Yes [name], MD  metoprolol tartrate (LOPRESSOR) 50 MG tablet TAKE 1 TABLET BY MOUTH TWICE A DAY 11/01/19  Yes Belva Crome, MD  nitroGLYCERIN (NITROSTAT) 0.4 MG SL tablet PLACE 1 TABLET UNDER THE TONGUE EVERY 5 MIN X 3 DOSES AS NEEDED FOR CHEST PAIN 08/02/19  Yes Belva Crome, MD  pravastatin (PRAVACHOL) 40 MG tablet TAKE 1 TABLET ONCE DAILY 05/30/19  Yes Wendie Agreste, MD  Semaglutide, 1 MG/DOSE, 2 MG/1.5ML SOPN Inject into the skin once a week.   Yes [provider]  sertraline (ZOLOFT) 25 MG tablet TAKE 1/2 TABLET BY MOUTH EVERY DAY 10/04/19  Yes Wendie Agreste, MD   Social History   Socioeconomic History  . Marital status: Married    Spouse name: [name]  . Number of children: 4  . Years of education: Not on file  . Highest education level: Not on file  Occupational History  . Occupation: Retired    Fish farm manager: UNEMPLOYED  Tobacco Use  . Smoking status: Former Smoker    Packs/day: 0.50    Years: 10.00    Pack years: 5.00    Types: Cigarettes    Quit date: 04/06/1983    Years since quitting: 36.7  . Smokeless tobacco: Never Used  Vaping Use  . Vaping  Use: Never used  Substance and Sexual Activity  .  Alcohol use: Yes    Alcohol/week: 0.0 standard drinks    Comment: `/26/2017 "might drink a beer q couple months mostly; summertime I might drink 2-3 beers/week"  . Drug use: No  . Sexual activity: Not Currently  Other Topics Concern  . Not on file  Social History Narrative   Lives in [place] with spouse Dorian Duval).  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   Social Determinants of Health   Financial Resource Strain:   . Difficulty of Paying Living Expenses: Not on file  Food Insecurity:   . Worried About Charity fundraiser in the Last Year: Not on file  . Ran Out of Food in the Last Year: Not on file  Transportation Needs:   . Lack of Transportation (Medical): Not on file  . Lack of Transportation (Non-Medical): Not on file  Physical Activity:   . Days of Exercise per Week: Not on file  . Minutes of Exercise per Session: Not on file  Stress:   . Feeling of Stress : Not on file  Social Connections:   . Frequency of Communication with Friends and Family: Not on file  . Frequency of Social Gatherings with Friends and Family: Not on file  . Attends Religious Services: Not on file  . Active Member of Clubs or Organizations: Not on file  . Attends Archivist Meetings: Not on file  . Marital Status: Not on file  Intimate Partner Violence:   . Fear of Current or Ex-Partner: Not on file  . Emotionally Abused: Not on file  . Physically Abused: Not on file  . Sexually Abused: Not on file    Review of Systems   Objective:   Vitals:   12/28/19 1133 12/28/19 1141  BP: (!) 186/109 (!) 146/82  Pulse: 80   Temp: 98.1 F (36.7 C)   TempSrc: Temporal   SpO2: 99%   Weight: 255 lb (115.7 kg)   Height: 5' 10" (1.778 m)      Physical Exam Vitals reviewed.  Constitutional:      Appearance: He is well-developed.  HENT:     Head: Normocephalic and atraumatic.  Eyes:     Pupils: Pupils are equal, round, and  reactive to light.  Neck:     Vascular: No carotid bruit or JVD.  Cardiovascular:     Rate and Rhythm: Normal rate and regular rhythm.     Heart sounds: Normal heart sounds. No murmur heard.   Pulmonary:     Effort: Pulmonary effort is normal.     Breath sounds: Normal breath sounds. No rales.  Skin:    General: Skin is warm and dry.  Neurological:     Mental Status: He is alert and oriented to person, place, and time.  Psychiatric:        Mood and Affect: Mood normal.        Behavior: Behavior normal.        Thought Content: Thought content normal.        Assessment & Plan:  MARKEES CARNS is a 69 y.o. male . Fatigue, unspecified type OSA on CPAP  -Improved fatigue, recommended continued compliance with CPAP, with any sleep.  Could have component of depression also impacting fatigue.  See below.  Recheck 1 month.  Needs flu shot - Plan: Flu Vaccine QUAD High Dose(Fluad)  Transient confusion -  Plan: Ambulatory referral to Neurology Memory difficulties - Plan: Ambulatory referral to Neurology  -Potential stress component/depression but did have episode of transient confusion, has noticed some increased difficulty with memory past 6 months or so.  Will refer to neurology to discuss the recent transient confusion episode as well as further testing.  Depression, unspecified depression type - Plan: sertraline (ZOLOFT) 25 MG tablet  -Some increased stressors, will try higher dose of Zoloft for depressive symptoms.  Recheck 1 month.  Meds ordered this encounter  Medications  . sertraline (ZOLOFT) 25 MG tablet    Sig: Take 1 tablet (25 mg total) by mouth daily.    Dispense:  90 tablet    Refill:  1   Patient Instructions     Make sure to use cpap anytime you sleep. I expect fatigue to continue to improve with better use of cpap.   Tried a higher dose of Zoloft at 25 mg each day to see if some of your symptoms may be due to depression.  I will refer you to a neurologist to  evaluate for the memory changes and episode you had a few weeks ago.  See information below on stress and stress management, but would also consider meeting with a therapist if you are feeling more stressed.  Recheck with me in 1 month.   Return to the clinic or go to the nearest emergency room if any of your symptoms worsen or new symptoms occur.   Fatigue If you have fatigue, you feel tired all the time and have a lack of energy or a lack of motivation. Fatigue may make it difficult to start or complete tasks because of exhaustion. In general, occasional or mild fatigue is often a normal response to activity or life. However, long-lasting (chronic) or extreme fatigue may be a symptom of a medical condition. Follow these instructions at home: General instructions  Watch your fatigue for any changes.  Go to bed and get up at the same time every day.  Avoid fatigue by pacing yourself during the day and getting enough sleep at night.  Maintain a healthy weight. Medicines  Take over-the-counter and prescription medicines only as told by your health care provider.  Take a multivitamin, if told by your health care provider.  Do not use herbal or dietary supplements unless they are approved by your health care provider. Activity   Exercise regularly, as told by your health care provider.  Use or practice techniques to help you relax, such as yoga, tai chi, meditation, or massage therapy. Eating and drinking   Avoid heavy meals in the evening.  Eat a well-balanced diet, which includes lean proteins, whole grains, plenty of fruits and vegetables, and low-fat dairy products.  Avoid consuming too much caffeine.  Avoid the use of alcohol.  Drink enough fluid to keep your urine pale yellow. Lifestyle  Change situations that cause you stress. Try to keep your work and personal schedule in balance.  Do not use any products that contain nicotine or tobacco, such as cigarettes and  e-cigarettes. If you need help quitting, ask your health care provider.  Do not use drugs. Contact a health care provider if:  Your fatigue does not get better.  You have a fever.  You suddenly lose or gain weight.  You have headaches.  You have trouble falling asleep or sleeping through the night.  You feel angry, guilty, anxious, or sad.  You are unable to have a bowel movement (constipation).  Your  skin is dry.  You have swelling in your legs or another part of your body. Get help right away if:  You feel confused.  Your vision is blurry.  You feel faint or you pass out.  You have a severe headache.  You have severe pain in your abdomen, your back, or the area between your waist and hips (pelvis).  You have chest pain, shortness of breath, or an irregular or fast heartbeat.  You are unable to urinate, or you urinate less than normal.  You have abnormal bleeding, such as bleeding from the rectum, vagina, nose, lungs, or nipples.  You vomit blood.  You have thoughts about hurting yourself or others. If you ever feel like you may hurt yourself or others, or have thoughts about taking your own life, get help right away. You can go to your nearest emergency department or call:  Your local emergency services (911 in the [place]  A suicide crisis helpline, such as the [facility] at 838-291-7631. This is open 24 hours a day. Summary  If you have fatigue, you feel tired all the time and have a lack of energy or a lack of motivation.  Fatigue may make it difficult to start or complete tasks because of exhaustion.  Long-lasting (chronic) or extreme fatigue may be a symptom of a medical condition.  Exercise regularly, as told by your health care provider.  Change situations that cause you stress. Try to keep your work and personal schedule in balance. This information is not intended to replace advice given to you by your health care  provider. Make sure you discuss any questions you have with your health care provider. Document Revised: 10/11/2018 Document Reviewed: 12/15/2016 Elsevier Patient Education  2020 [place] With Depression Everyone experiences occasional disappointment, sadness, and loss in their lives. When you are feeling down, blue, or sad for at least 2 weeks in a row, it may mean that you have depression. Depression can affect your thoughts and feelings, relationships, daily activities, and physical health. It is caused by changes in the way your brain functions. If you receive a diagnosis of depression, your health care provider will tell you which type of depression you have and what treatment options are available to you. If you are living with depression, there are ways to help you recover from it and also ways to prevent it from coming back. How to cope with lifestyle changes Coping with stress     Stress is your body's reaction to life changes and events, both good and bad. Stressful situations may include:  Getting married.  The death of a spouse.  Losing a job.  Retiring.  Having a baby. Stress can last just a few hours or it can be ongoing. Stress can play a major role in depression, so it is important to learn both how to cope with stress and how to think about it differently. Talk with your health care provider or a counselor if you would like to learn more about stress reduction. He or she may suggest some stress reduction techniques, such as:  Music therapy. This can include creating music or listening to music. Choose music that you enjoy and that inspires you.  Mindfulness-based meditation. This kind of meditation can be done while sitting or walking. It involves being aware of your normal breaths, rather than trying to control your breathing.  Centering prayer. This is a kind of meditation that involves focusing on a spiritual  word or phrase. Choose a word, phrase, or  sacred image that is meaningful to you and that brings you peace.  Deep breathing. To do this, expand your stomach and inhale slowly through your nose. Hold your breath for 3-5 seconds, then exhale slowly, allowing your stomach muscles to relax.  Muscle relaxation. This involves intentionally tensing muscles then relaxing them. Choose a stress reduction technique that fits your lifestyle and personality. Stress reduction techniques take time and practice to develop. Set aside 5-15 minutes a day to do them. Therapists can offer training in these techniques. The training may be covered by some insurance plans. Other things you can do to manage stress include:  Keeping a stress diary. This can help you learn what triggers your stress and ways to control your response.  Understanding what your limits are and saying no to requests or events that lead to a schedule that is too full.  Thinking about how you respond to certain situations. You may not be able to control everything, but you can control how you react.  Adding humor to your life by watching funny films or TV shows.  Making time for activities that help you relax and not feeling guilty about spending your time this way.  Medicines Your health care provider may suggest certain medicines if he or she feels that they will help improve your condition. Avoid using alcohol and other substances that may prevent your medicines from working properly (may interact). It is also important to:  Talk with your pharmacist or health care provider about all the medicines that you take, their possible side effects, and what medicines are safe to take together.  Make it your goal to take part in all treatment decisions (shared decision-making). This includes giving input on the side effects of medicines. It is best if shared decision-making with your health care provider is part of your total treatment plan. If your health care provider prescribes a  medicine, you may not notice the full benefits of it for 4-8 weeks. Most people who are treated for depression need to be on medicine for at least 6-12 months after they feel better. If you are taking medicines as part of your treatment, do not stop taking medicines without first talking to your health care provider. You may need to have the medicine slowly decreased (tapered) over time to decrease the risk of harmful side effects. Relationships Your health care provider may suggest family therapy along with individual therapy and drug therapy. While there may not be family problems that are causing you to feel depressed, it is still important to make sure your family learns as much as they can about your mental health. Having your family's support can help make your treatment successful. How to recognize changes in your condition Everyone has a different response to treatment for depression. Recovery from major depression happens when you have not had signs of major depression for two months. This may mean that you will start to:  Have more interest in doing activities.  Feel less hopeless than you did 2 months ago.  Have more energy.  Overeat less often, or have better or improving appetite.  Have better concentration. Your health care provider will work with you to decide the next steps in your recovery. It is also important to recognize when your condition is getting worse. Watch for these signs:  Having fatigue or low energy.  Eating too much or too little.  Sleeping too much or too little.  Feeling restless, agitated, or hopeless.  Having trouble concentrating or making decisions.  Having unexplained physical complaints.  Feeling irritable, angry, or aggressive. Get help as soon as you or your family members notice these symptoms coming back. How to get support and help from others How to talk with friends and family members about your condition  Talking to friends and family  members about your condition can provide you with one way to get support and guidance. Reach out to trusted friends or family members, explain your symptoms to them, and let them know that you are working with a health care provider to treat your depression. Financial resources Not all insurance plans cover mental health care, so it is important to check with your insurance carrier. If paying for co-pays or counseling services is a problem, search for a local or county mental health care center. They may be able to offer public mental health care services at low or no cost when you are not able to see a private health care provider. If you are taking medicine for depression, you may be able to get the generic form, which may be less expensive. Some makers of prescription medicines also offer help to patients who cannot afford the medicines they need. Follow these instructions at home:   Get the right amount and quality of sleep.  Cut down on using caffeine, tobacco, alcohol, and other potentially harmful substances.  Try to exercise, such as walking or lifting small weights.  Take over-the-counter and prescription medicines only as told by your health care provider.  Eat a healthy diet that includes plenty of vegetables, fruits, whole grains, low-fat dairy products, and lean protein. Do not eat a lot of foods that are high in solid fats, added sugars, or salt.  Keep all follow-up visits as told by your health care provider. This is important. Contact a health care provider if:  You stop taking your antidepressant medicines, and you have any of these symptoms: ? Nausea. ? Headache. ? Feeling lightheaded. ? Chills and body aches. ? Not being able to sleep (insomnia).  You or your friends and family think your depression is getting worse. Get help right away if:  You have thoughts of hurting yourself or others. If you ever feel like you may hurt yourself or others, or have thoughts about  taking your own life, get help right away. You can go to your nearest emergency department or call:  Your local emergency services (911 in the [place]  A suicide crisis helpline, such as the [place] at (620)164-3386. This is open 24-hours a day. Summary  If you are living with depression, there are ways to help you recover from it and also ways to prevent it from coming back.  Work with your health care team to create a management plan that includes counseling, stress management techniques, and healthy lifestyle habits. This information is not intended to replace advice given to you by your health care provider. Make sure you discuss any questions you have with your health care provider. Document Revised: 07/14/2018 Document Reviewed: 02/23/2016 Elsevier Patient Education  [employer].   If you have lab work done today you will be contacted with your lab results within the next 2 weeks.  If you have not heard from [place] then please contact us. The fastest way to get your results is to register for My Chart.   IF you received an x-ray today, you will receive an invoice from [facility]  Radiology. Please contact [facility]iology at [phone] with questions or concerns regarding your invoice.   IF you received labwork today, you will receive an invoice from [place]. Please contact [facility] at [phone] with questions or concerns regarding your invoice.   Our billing staff will not be able to assist you with questions regarding bills from these companies.  You will be contacted with the lab results as soon as they are available. The fastest way to get your results is to activate your My Chart account. Instructions are located on the last page of this paperwork. If you have not heard from [place] regarding the results in 2 weeks, please contact this office.         Signed, [name], MD [facility]

## 2019-12-28 NOTE — Patient Instructions (Addendum)
Make sure to use cpap anytime you sleep. I expect fatigue to continue to improve with better use of cpap.   Tried a higher dose of Zoloft at 25 mg each day to see if some of your symptoms may be due to depression.  I will refer you to a neurologist to evaluate for the memory changes and episode you had a few weeks ago.  See information below on stress and stress management, but would also consider meeting with a therapist if you are feeling more stressed.  Recheck with me in 1 month.   Return to the clinic or go to the nearest emergency room if any of your symptoms worsen or new symptoms occur.   Fatigue If you have fatigue, you feel tired all the time and have a lack of energy or a lack of motivation. Fatigue may make it difficult to start or complete tasks because of exhaustion. In general, occasional or mild fatigue is often a normal response to activity or life. However, long-lasting (chronic) or extreme fatigue may be a symptom of a medical condition. Follow these instructions at home: General instructions  Watch your fatigue for any changes.  Go to bed and get up at the same time every day.  Avoid fatigue by pacing yourself during the day and getting enough sleep at night.  Maintain a healthy weight. Medicines  Take over-the-counter and prescription medicines only as told by your health care provider.  Take a multivitamin, if told by your health care provider.  Do not use herbal or dietary supplements unless they are approved by your health care provider. Activity   Exercise regularly, as told by your health care provider.  Use or practice techniques to help you relax, such as yoga, tai chi, meditation, or massage therapy. Eating and drinking   Avoid heavy meals in the evening.  Eat a well-balanced diet, which includes lean proteins, whole grains, plenty of fruits and vegetables, and low-fat dairy products.  Avoid consuming too much caffeine.  Avoid the use of  alcohol.  Drink enough fluid to keep your urine pale yellow. Lifestyle  Change situations that cause you stress. Try to keep your work and personal schedule in balance.  Do not use any products that contain nicotine or tobacco, such as cigarettes and e-cigarettes. If you need help quitting, ask your health care provider.  Do not use drugs. Contact a health care provider if:  Your fatigue does not get better.  You have a fever.  You suddenly lose or gain weight.  You have headaches.  You have trouble falling asleep or sleeping through the night.  You feel angry, guilty, anxious, or sad.  You are unable to have a bowel movement (constipation).  Your skin is dry.  You have swelling in your legs or another part of your body. Get help right away if:  You feel confused.  Your vision is blurry.  You feel faint or you pass out.  You have a severe headache.  You have severe pain in your abdomen, your back, or the area between your waist and hips (pelvis).  You have chest pain, shortness of breath, or an irregular or fast heartbeat.  You are unable to urinate, or you urinate less than normal.  You have abnormal bleeding, such as bleeding from the rectum, vagina, nose, lungs, or nipples.  You vomit blood.  You have thoughts about hurting yourself or others. If you ever feel like you may hurt yourself or others, or  have thoughts about taking your own life, get help right away. You can go to your nearest emergency department or call:  Your local emergency services (911 in the [place]  A suicide crisis helpline, such as the [place] at 973-310-8371. This is open 24 hours a day. Summary  If you have fatigue, you feel tired all the time and have a lack of energy or a lack of motivation.  Fatigue may make it difficult to start or complete tasks because of exhaustion.  Long-lasting (chronic) or extreme fatigue may be a symptom of a medical  condition.  Exercise regularly, as told by your health care provider.  Change situations that cause you stress. Try to keep your work and personal schedule in balance. This information is not intended to replace advice given to you by your health care provider. Make sure you discuss any questions you have with your health care provider. Document Revised: 10/11/2018 Document Reviewed: 12/15/2016 Elsevier Patient Education  2020 [place] With Depression Everyone experiences occasional disappointment, sadness, and loss in their lives. When you are feeling down, blue, or sad for at least 2 weeks in a row, it may mean that you have depression. Depression can affect your thoughts and feelings, relationships, daily activities, and physical health. It is caused by changes in the way your brain functions. If you receive a diagnosis of depression, your health care provider will tell you which type of depression you have and what treatment options are available to you. If you are living with depression, there are ways to help you recover from it and also ways to prevent it from coming back. How to cope with lifestyle changes Coping with stress     Stress is your body's reaction to life changes and events, both good and bad. Stressful situations may include:  Getting married.  The death of a spouse.  Losing a job.  Retiring.  Having a baby. Stress can last just a few hours or it can be ongoing. Stress can play a major role in depression, so it is important to learn both how to cope with stress and how to think about it differently. Talk with your health care provider or a counselor if you would like to learn more about stress reduction. He or she may suggest some stress reduction techniques, such as:  Music therapy. This can include creating music or listening to music. Choose music that you enjoy and that inspires you.  Mindfulness-based meditation. This kind of meditation can  be done while sitting or walking. It involves being aware of your normal breaths, rather than trying to control your breathing.  Centering prayer. This is a kind of meditation that involves focusing on a spiritual word or phrase. Choose a word, phrase, or sacred image that is meaningful to you and that brings you peace.  Deep breathing. To do this, expand your stomach and inhale slowly through your nose. Hold your breath for 3-5 seconds, then exhale slowly, allowing your stomach muscles to relax.  Muscle relaxation. This involves intentionally tensing muscles then relaxing them. Choose a stress reduction technique that fits your lifestyle and personality. Stress reduction techniques take time and practice to develop. Set aside 5-15 minutes a day to do them. Therapists can offer training in these techniques. The training may be covered by some insurance plans. Other things you can do to manage stress include:  Keeping a stress diary. This can help you learn what triggers your stress  and ways to control your response.  Understanding what your limits are and saying no to requests or events that lead to a schedule that is too full.  Thinking about how you respond to certain situations. You may not be able to control everything, but you can control how you react.  Adding humor to your life by watching funny films or TV shows.  Making time for activities that help you relax and not feeling guilty about spending your time this way.  Medicines Your health care provider may suggest certain medicines if he or she feels that they will help improve your condition. Avoid using alcohol and other substances that may prevent your medicines from working properly (may interact). It is also important to:  Talk with your pharmacist or health care provider about all the medicines that you take, their possible side effects, and what medicines are safe to take together.  Make it your goal to take part in all  treatment decisions (shared decision-making). This includes giving input on the side effects of medicines. It is best if shared decision-making with your health care provider is part of your total treatment plan. If your health care provider prescribes a medicine, you may not notice the full benefits of it for 4-8 weeks. Most people who are treated for depression need to be on medicine for at least 6-12 months after they feel better. If you are taking medicines as part of your treatment, do not stop taking medicines without first talking to your health care provider. You may need to have the medicine slowly decreased (tapered) over time to decrease the risk of harmful side effects. Relationships Your health care provider may suggest family therapy along with individual therapy and drug therapy. While there may not be family problems that are causing you to feel depressed, it is still important to make sure your family learns as much as they can about your mental health. Having your family's support can help make your treatment successful. How to recognize changes in your condition Everyone has a different response to treatment for depression. Recovery from major depression happens when you have not had signs of major depression for two months. This may mean that you will start to:  Have more interest in doing activities.  Feel less hopeless than you did 2 months ago.  Have more energy.  Overeat less often, or have better or improving appetite.  Have better concentration. Your health care provider will work with you to decide the next steps in your recovery. It is also important to recognize when your condition is getting worse. Watch for these signs:  Having fatigue or low energy.  Eating too much or too little.  Sleeping too much or too little.  Feeling restless, agitated, or hopeless.  Having trouble concentrating or making decisions.  Having unexplained physical complaints.  Feeling  irritable, angry, or aggressive. Get help as soon as you or your family members notice these symptoms coming back. How to get support and help from others How to talk with friends and family members about your condition  Talking to friends and family members about your condition can provide you with one way to get support and guidance. Reach out to trusted friends or family members, explain your symptoms to them, and let them know that you are working with a health care provider to treat your depression. Financial resources Not all insurance plans cover mental health care, so it is important to check with your insurance carrier. If  paying for co-pays or counseling services is a problem, search for a local or county mental health care center. They may be able to offer public mental health care services at low or no cost when you are not able to see a private health care provider. If you are taking medicine for depression, you may be able to get the generic form, which may be less expensive. Some makers of prescription medicines also offer help to patients who cannot afford the medicines they need. Follow these instructions at home:   Get the right amount and quality of sleep.  Cut down on using caffeine, tobacco, alcohol, and other potentially harmful substances.  Try to exercise, such as walking or lifting small weights.  Take over-the-counter and prescription medicines only as told by your health care provider.  Eat a healthy diet that includes plenty of vegetables, fruits, whole grains, low-fat dairy products, and lean protein. Do not eat a lot of foods that are high in solid fats, added sugars, or salt.  Keep all follow-up visits as told by your health care provider. This is important. Contact a health care provider if:  You stop taking your antidepressant medicines, and you have any of these symptoms: ? Nausea. ? Headache. ? Feeling lightheaded. ? Chills and body aches. ? Not being  able to sleep (insomnia).  You or your friends and family think your depression is getting worse. Get help right away if:  You have thoughts of hurting yourself or others. If you ever feel like you may hurt yourself or others, or have thoughts about taking your own life, get help right away. You can go to your nearest emergency department or call:  Your local emergency services (911 in the [place]  A suicide crisis helpline, such as the [place] at (575)046-8278. This is open 24-hours a day. Summary  If you are living with depression, there are ways to help you recover from it and also ways to prevent it from coming back.  Work with your health care team to create a management plan that includes counseling, stress management techniques, and healthy lifestyle habits. This information is not intended to replace advice given to you by your health care provider. Make sure you discuss any questions you have with your health care provider. Document Revised: 07/14/2018 Document Reviewed: 02/23/2016 Elsevier Patient Education  [employer].   If you have lab work done today you will be contacted with your lab results within the next 2 weeks.  If you have not heard from [place] then please contact us. The fastest way to get your results is to register for My Chart.   IF you received an x-ray today, you will receive an invoice from [facility] Radiology. Please contact [facility] Radiology at [phone] with questions or concerns regarding your invoice.   IF you received labwork today, you will receive an invoice from [place]. Please contact [facility] at [phone] with questions or concerns regarding your invoice.   Our billing staff will not be able to assist you with questions regarding bills from these companies.  You will be contacted with the lab results as soon as they are available. The fastest way to get your results is to activate your My Chart account.  Instructions are located on the last page of this paperwork. If you have not heard from [place] regarding the results in 2 weeks, please contact this office.

## 2019-12-31 ENCOUNTER — Ambulatory Visit (INDEPENDENT_AMBULATORY_CARE_PROVIDER_SITE_OTHER): Payer: Federal, State, Local not specified - PPO | Admitting: Emergency Medicine

## 2019-12-31 VITALS — BP 143/82 | Ht 70.0 in | Wt 255.0 lb

## 2019-12-31 DIAGNOSIS — Z0000 Encounter for general adult medical examination without abnormal findings: Secondary | ICD-10-CM

## 2019-12-31 DIAGNOSIS — H9193 Unspecified hearing loss, bilateral: Secondary | ICD-10-CM

## 2019-12-31 NOTE — Progress Notes (Addendum)
Presents today for [employer] Visit   Date of last exam: 12-28-2019  Interpreter used for this visit?  No  I connected with  [name] on 12/31/19 by a telephone  and verified that I am speaking with the correct person using two identifiers.   I discussed the limitations of evaluation and management by telemedicine. The patient expressed understanding and agreed to proceed.  Patient location: home  Provider location: in office  I provided 20 minutes of non face - to - face time during this encounter.   Patient Care Team: Wendie Agreste, MD as PCP - General (Family Medicine) Belva Crome, MD as PCP - Cardiology (Cardiology)   Other items to address today:   Discussed immunizations Discussed Eye/Dental Follow up scheduled 10-25 @ 2:20 Dr. Carlota Raspberry   Other Screening: Last screening for diabetes: 08/29/2019 Last lipid screening: 08/29/2019  ADVANCE DIRECTIVES: Discussed: yes On File: no Materials Provided: yes  Immunization status:  Immunization History  Administered Date(s) Administered   Fluad Quad(high Dose 65+) 02/13/2019, 12/28/2019   Influenza Whole 02/04/2011   Influenza,inj,Quad PF,6+ Mos 12/18/2012, 01/24/2014, 03/31/2018   Moderna SARS-COVID-2 Vaccination 05/09/2019, 06/06/2019   Pneumococcal Conjugate-13 05/04/2018   Pneumococcal Polysaccharide-23 08/23/2008, 02/13/2019   Td 08/23/2008     There are no preventive care reminders to display for this patient.   Functional Status Survey: Is the patient deaf or have difficulty hearing?: No Does the patient have difficulty seeing, even when wearing glasses/contacts?: No Does the patient have difficulty concentrating, remembering, or making decisions?: No Does the patient have difficulty walking or climbing stairs?: No Does the patient have difficulty dressing or bathing?: No Does the patient have difficulty doing errands alone such as visiting a doctor's office or shopping?:  No    6CIT Screen 12/31/2019 12/28/2019  What Year? 0 points 0 points  What month? 0 points 0 points  What time? 0 points 0 points  Count back from 20 0 points 0 points  Months in reverse 0 points 0 points  Repeat phrase 0 points 4 points  Total Score 0 4        Clinical Support from 12/31/2019 in [place] at [facility]  AUDIT-C Score 2        Home Environment:   Lives in a one story home No trouble climbing stairs No scattered rugs No grab bars Adequate lighting/ no clutter   Patient Active Problem List   Diagnosis Date Noted   Statin myopathy 04/04/2019   Statin intolerance -weakness, stiffness 02/13/2019   Rupture of right supraspinatus tendon 01/18/2019   Complex tear of medial meniscus of left knee 09/14/2018   Malignant neoplasm of prostate ([place]) 01/04/2018   Refusal of blood transfusions as patient is Jehovah's Witness    Pneumonia    Osteoarthritis    Obstructive sleep apnea    Hyperlipidemia    HTN (hypertension)    GERD (gastroesophageal reflux disease)    Depression    Coronary artery disease    Chronic lower back pain    Chest pain    Arthritis    Anemia    Benign essential HTN 01/03/2017   PAT (paroxysmal atrial tachycardia) ([place]) 10/27/2015   Type 2 diabetes mellitus with circulatory disorder, without long-term current use of insulin ([place]) 06/19/2015   Angina pectoris ([place])    Elevated PSA 12/20/2014   CAD (coronary artery disease)    Obstructive sleep apnea 09/06/2014   Personal history of colonic polyps 12/13/2013  Blood in stool 12/13/2013   Prostatitis, acute 12/13/2013   Insomnia 04/19/2013   Obesity with body mass index of 30.0-39.9 10/18/2012   Right lumbar radiculitis 09/29/2011   Adenomatous colon polyp 04/05/2009   ORGANIC IMPOTENCE 11/11/2008   Hyperlipidemia LDL goal <70 08/23/2008   GERD 08/23/2008   BPH (benign prostatic hypertrophy) with urinary obstruction 08/23/2008   OSTEOARTHRITIS 08/23/2008     Past Medical  History:  Diagnosis Date   Adenomatous colon polyp 2011   Anemia    Angina pectoris ([place]) 05/01/2015   med rx for 95% OM (u/a to access due to tortuosity), 95% D1 and other moderate CAD at cath   Arthritis    "back, knees" (05/01/2015)   Chest pain    pleuritic   Chronic lower back pain    Complex tear of medial meniscus of left knee 09/14/2018   Coronary artery disease    Depression    GERD (gastroesophageal reflux disease)    HTN (hypertension)    Hyperlipidemia    Obstructive sleep apnea    noncompliant with CPAP   Osteoarthritis    Pneumonia ~ 2014 X 1   Prostate cancer ([place])    Refusal of blood transfusions as patient is Jehovah's Witness    Rotator cuff arthropathy, right    Rupture of right supraspinatus tendon 01/18/2019   Type II or unspecified type diabetes mellitus without mention of complication, not stated as uncontrolled      Past Surgical History:  Procedure Laterality Date   CARDIAC CATHETERIZATION  2004   patent coronary arteries   CARDIAC CATHETERIZATION  05/01/2015   "tried to put stent in but couldn't"   CARDIAC CATHETERIZATION N/A 05/01/2015   Procedure: Left Heart Cath and Coronary Angiography;  Surgeon: Belva Crome, MD; LAD 60%, oD1 90%, pD1 70%, D2 70%, CFX patent stent, 30% distal to prev stent, pRCA 20%, OM1 90/95%, NL LV   CARDIAC CATHETERIZATION N/A 05/01/2015   Procedure: Coronary Stent Intervention;  Surgeon: Belva Crome, MD;  Unsuccessful PCI OM due to tortuosity   CARDIAC CATHETERIZATION N/A 11/06/2014   Procedure: Left Heart Cath and Coronary Angiography;  Surgeon: Belva Crome, MD;  Location: [facility];  Service: Cardiovascular;  Laterality: N/A;   CLOSED REDUCTION SHOULDER DISLOCATION Right ~ 1975   "& reattached muscle"   COLONOSCOPY W/ BIOPSIES AND POLYPECTOMY  X 2   ESOPHAGOGASTRODUODENOSCOPY ENDOSCOPY     KNEE ARTHROSCOPY WITH MEDIAL MENISECTOMY Left 09/14/2018   Procedure: LEFT KNEE ARTHROSCOPY CHONDROPLASY,  WITH MEDIAL  MENISECTOMY;  Surgeon: Marchia Bond, MD;  Location: [place];  Service: Orthopedics;  Laterality: Left;   PILONIDAL CYST EXCISION     SHOULDER ARTHROSCOPY WITH DEBRIDEMENT AND BICEP TENDON REPAIR Right 01/18/2019   Procedure: RIGHT SHOULDER ARTHROSCOPY WITH DEBRIDEMENT AND DECOMPRSSION SUBACROMIAL PARTIAL ACROMIOPLASTY;  Surgeon: Marchia Bond, MD;  Location: [facility] [facility];  Service: Orthopedics;  Laterality: Right;   SHOULDER ARTHROSCOPY WITH ROTATOR CUFF REPAIR Right 01/18/2019   Procedure: SHOULDER ARTHROSCOPY WITH ROTATOR CUFF REPAIR;  Surgeon: Marchia Bond, MD;  Location: [place];  Service: Orthopedics;  Laterality: Right;     Family History  Problem Relation Age of Onset   Coronary artery disease Other        CABG   Coronary artery disease Mother    Stroke Mother    Heart disease Mother    Diabetes Mother    Other Father    Diabetes Sister    Diabetes  Maternal Uncle        x2   Alcohol abuse Other    Diabetes Paternal Uncle        x2   Colon cancer Paternal Uncle    Colon polyps Neg Hx    Esophageal cancer Neg Hx      Social History   Socioeconomic History   Marital status: Married    Spouse name: [name]   Number of children: 4   Years of education: Not on file   Highest education level: Not on file  Occupational History   Occupation: Retired    Fish farm manager: UNEMPLOYED  Tobacco Use   Smoking status: Former Smoker    Packs/day: 0.50    Years: 10.00    Pack years: 5.00    Types: Cigarettes    Quit date: 04/06/1983    Years since quitting: 36.7   Smokeless tobacco: Never Used  Vaping Use   Vaping Use: Never used  Substance and Sexual Activity   Alcohol use: Yes    Alcohol/week: 0.0 standard drinks    Comment: `/26/2017 "might drink a beer q couple months mostly; summertime I might drink 2-3 beers/week"   Drug use: No   Sexual activity: Not Currently  Other Topics Concern   Not on file  Social History  Narrative   Lives in [place] with spouse Travanti Mcmanus).  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   Social Determinants of Health   Financial Resource Strain:    Difficulty of Paying Living Expenses: Not on file  Food Insecurity:    Worried About Charity fundraiser in the Last Year: Not on file   [employer] of Food in the Last Year: Not on file  Transportation Needs:    Lack of Transportation (Medical): Not on file   Lack of Transportation (Non-Medical): Not on file  Physical Activity:    Days of Exercise per Week: Not on file   Minutes of Exercise per Session: Not on file  Stress:    Feeling of Stress : Not on file  Social Connections:    Frequency of Communication with Friends and Family: Not on file   Frequency of Social Gatherings with Friends and Family: Not on file   Attends Religious Services: Not on file   Active Member of [place] or Organizations: Not on file   Attends Archivist Meetings: Not on file   Marital Status: Not on file  Intimate Partner Violence:    Fear of Current or Ex-Partner: Not on file   Emotionally Abused: Not on file   Physically Abused: Not on file   Sexually Abused: Not on file     Allergies  Allergen Reactions   Statins Other (See Comments)    REACTION: joint pain Lipitor- headaches Has also tried Livalo, pravachol, zetia, crestor, welchol   Imdur [Isosorbide Dinitrate]     Headache - "violent"     Prior to Admission medications   Medication Sig Start Date End Date Taking? Authorizing Provider  albuterol (VENTOLIN HFA) 108 (90 Base) MCG/ACT inhaler Inhale 1-2 puffs into the lungs every 4 (four) hours as needed for wheezing or shortness of breath. 10/22/19  Yes Wendie Agreste, MD  aspirin EC 81 MG tablet Take 81 mg by mouth daily. Swallow whole.   Yes [provider]  Blood Glucose Monitoring Suppl (RELION PREMIER BLU MONITOR) DEVI 1 Device by Does not apply route  daily. 07/12/16  Yes [name], MD  fluticasone (FLONASE) 50 MCG/ACT nasal spray PLACE 1-2 SPRAYS INTO BOTH NOSTRILS DAILY. 09/21/19  Yes Wendie Agreste, MD  glipiZIDE (GLUCOTROL XL) 5 MG 24 hr tablet TAKE 1 TABLET BY MOUTH TWICE A DAY 09/17/19  Yes [name], MD  glucose blood test strip Use as instructed to check sugar 2 times daily 07/12/16  Yes [name], MD  hydrALAZINE (APRESOLINE) 25 MG tablet Take 1 tablet (25 mg total) by mouth in the morning and at bedtime. 08/29/19  Yes Weaver, Scott T, PA-C  hydrochlorothiazide (HYDRODIURIL) 25 MG tablet TAKE 1 TABLET BY MOUTH EVERY DAY 05/25/19  Yes Belva Crome, MD  insulin detemir (LEVEMIR FLEXTOUCH) 100 UNIT/ML FlexPen INJECT 16 UNITS INTO THE SKIN DAILY AT 10 PM.(1 BOX =140 DAYS) 09/28/19  Yes [name], MD  Insulin Pen Needle (NOVOFINE PLUS) 32G X 4 MM MISC Use 1x a day 05/09/14  Yes [name], MD  isosorbide mononitrate (IMDUR) 60 MG 24 hr tablet Take 1 tablet (60 mg total) by mouth daily. 11/22/19  Yes Belva Crome, MD  losartan (COZAAR) 100 MG tablet TAKE 1 TABLET BY MOUTH EVERY DAY 05/25/19  Yes Belva Crome, MD  metFORMIN (GLUCOPHAGE) 500 MG tablet TAKE 2 TABLETS (1,000 MG TOTAL) BY MOUTH 2 (TWO) TIMES DAILY WITH A MEAL. 07/31/19  Yes [name], MD  metoprolol tartrate (LOPRESSOR) 50 MG tablet TAKE 1 TABLET BY MOUTH TWICE A DAY 11/01/19  Yes Belva Crome, MD  nitroGLYCERIN (NITROSTAT) 0.4 MG SL tablet PLACE 1 TABLET UNDER THE TONGUE EVERY 5 MIN X 3 DOSES AS NEEDED FOR CHEST PAIN 08/02/19  Yes Belva Crome, MD  pravastatin (PRAVACHOL) 40 MG tablet TAKE 1 TABLET ONCE DAILY 05/30/19  Yes Wendie Agreste, MD  Semaglutide, 1 MG/DOSE, 2 MG/1.5ML SOPN Inject into the skin once a week.   Yes [provider]  sertraline (ZOLOFT) 25 MG tablet Take 1 tablet (25 mg total) by mouth daily. 12/28/19  Yes Wendie Agreste, MD     Depression screen [facility] 2/9 12/31/2019 10/22/2019 06/11/2019 04/30/2019 04/09/2019   Decreased Interest 0 0 0 0 0  Down, Depressed, Hopeless 0 0 0 0 0  PHQ - 2 Score 0 0 0 0 0  Altered sleeping - - - 1 -  Tired, decreased energy - - - 2 -  Change in appetite - - - 2 -  Feeling bad or failure about yourself  - - - 0 -  Trouble concentrating - - - 1 -  Moving slowly or fidgety/restless - - - 0 -  Suicidal thoughts - - - 0 -  PHQ-9 Score - - - 6 -  Difficult doing work/chores - - - - -  Some recent data might be hidden     Fall Risk  12/31/2019 10/22/2019 10/22/2019 06/11/2019 04/30/2019  Falls in the past year? 0 0 0 0 0  Number falls in past yr: 0 - - - 0  Injury with Fall? 0 - - - 0  Follow up Falls evaluation completed;Education provided Falls evaluation completed Falls evaluation completed Falls evaluation completed -      PHYSICAL EXAM: BP (!) 143/82 Comment: taken from a previous visit/ not in clinic   Ht 5\' 10"  (1.778 m)    Wt 255 lb (115.7 kg)    BMI 36.59 kg/m    Wt Readings from Last 3 Encounters:  12/31/19 255 lb (115.7 kg)  12/28/19 255 lb (115.7 kg)  11/22/19 254 lb 3.2 oz (115.3 kg)       Education/Counseling provided regarding diet and exercise, prevention of chronic diseases, smoking/tobacco cessation, if applicable, and reviewed "Covered Medicare Preventive Services."   I have reviewed and agree with the above AWV documentation. Agustina Caroli, MD Patient: Home  Provider: Office

## 2019-12-31 NOTE — Patient Instructions (Addendum)
Thank you for taking time to come for your Medicare Wellness Visit. I appreciate your ongoing commitment to your health goals. Please review the following plan we discussed and let me know if I can assist you in the future.  Leroy Kennedy LPN  Preventive Care 70 Years and Older, Male Preventive care refers to lifestyle choices and visits with your health care provider that can promote health and wellness. This includes:  A yearly physical exam. This is also called an annual well check.  Regular dental and eye exams.  Immunizations.  Screening for certain conditions.  Healthy lifestyle choices, such as diet and exercise. What can I expect for my preventive care visit? Physical exam Your health care provider will check:  Height and weight. These may be used to calculate body mass index (BMI), which is a measurement that tells if you are at a healthy weight.  Heart rate and blood pressure.  Your skin for abnormal spots. Counseling Your health care provider may ask you questions about:  Alcohol, tobacco, and drug use.  Emotional well-being.  Home and relationship well-being.  Sexual activity.  Eating habits.  History of falls.  Memory and ability to understand (cognition).  Work and work Statistician. What immunizations do I need?  Influenza (flu) vaccine  This is recommended every year. Tetanus, diphtheria, and pertussis (Tdap) vaccine  You may need a Td booster every 10 years. Varicella (chickenpox) vaccine  You may need this vaccine if you have not already been vaccinated. Zoster (shingles) vaccine  You may need this after age 66. Pneumococcal conjugate (PCV13) vaccine  One dose is recommended after age 84. Pneumococcal polysaccharide (PPSV23) vaccine  One dose is recommended after age 88. Measles, mumps, and rubella (MMR) vaccine  You may need at least one dose of MMR if you were born in 1957 or later. You may also need a second dose. Meningococcal  conjugate (MenACWY) vaccine  You may need this if you have certain conditions. Hepatitis A vaccine  You may need this if you have certain conditions or if you travel or work in places where you may be exposed to hepatitis A. Hepatitis B vaccine  You may need this if you have certain conditions or if you travel or work in places where you may be exposed to hepatitis B. Haemophilus influenzae type b (Hib) vaccine  You may need this if you have certain conditions. You may receive vaccines as individual doses or as more than one vaccine together in one shot (combination vaccines). Talk with your health care provider about the risks and benefits of combination vaccines. What tests do I need? Blood tests  Lipid and cholesterol levels. These may be checked every 5 years, or more frequently depending on your overall health.  Hepatitis C test.  Hepatitis B test. Screening  Lung cancer screening. You may have this screening every year starting at age 24 if you have a 30-pack-year history of smoking and currently smoke or have quit within the past 15 years.  Colorectal cancer screening. All adults should have this screening starting at age 52 and continuing until age 61. Your health care provider may recommend screening at age 57 if you are at increased risk. You will have tests every 1-10 years, depending on your results and the type of screening test.  Prostate cancer screening. Recommendations will vary depending on your family history and other risks.  Diabetes screening. This is done by checking your blood sugar (glucose) after you have not eaten for  a while (fasting). You may have this done every 1-3 years.  Abdominal aortic aneurysm (AAA) screening. You may need this if you are a current or former smoker.  Sexually transmitted disease (STD) testing. Follow these instructions at home: Eating and drinking  Eat a diet that includes fresh fruits and vegetables, whole grains, lean  protein, and low-fat dairy products. Limit your intake of foods with high amounts of sugar, saturated fats, and salt.  Take vitamin and mineral supplements as recommended by your health care provider.  Do not drink alcohol if your health care provider tells you not to drink.  If you drink alcohol: ? Limit how much you have to 0-2 drinks a day. ? Be aware of how much alcohol is in your drink. In the [place] one drink equals one 12 oz bottle of beer (355 mL), one 5 oz glass of wine (148 mL), or one 1 oz glass of hard liquor (44 mL). Lifestyle  Take daily care of your teeth and gums.  Stay active. Exercise for at least 30 minutes on 5 or more days each week.  Do not use any products that contain nicotine or tobacco, such as cigarettes, e-cigarettes, and chewing tobacco. If you need help quitting, ask your health care provider.  If you are sexually active, practice safe sex. Use a condom or other form of protection to prevent STIs (sexually transmitted infections).  Talk with your health care provider about taking a low-dose aspirin or statin. What's next?  Visit your health care provider once a year for a well check visit.  Ask your health care provider how often you should have your eyes and teeth checked.  Stay up to date on all vaccines. This information is not intended to replace advice given to you by your health care provider. Make sure you discuss any questions you have with your health care provider. Document Revised: 03/16/2018 Document Reviewed: 03/16/2018 Elsevier Patient Education  2020 Elsevier Inc.  

## 2020-01-02 ENCOUNTER — Telehealth: Payer: Self-pay

## 2020-01-03 ENCOUNTER — Other Ambulatory Visit: Payer: Self-pay

## 2020-01-03 ENCOUNTER — Encounter: Payer: Self-pay | Admitting: Emergency Medicine

## 2020-01-03 ENCOUNTER — Ambulatory Visit (INDEPENDENT_AMBULATORY_CARE_PROVIDER_SITE_OTHER): Payer: Federal, State, Local not specified - PPO | Admitting: Emergency Medicine

## 2020-01-03 VITALS — BP 175/80 | HR 66 | Temp 98.3°F | Resp 16 | Ht 70.0 in | Wt 258.0 lb

## 2020-01-03 DIAGNOSIS — R350 Frequency of micturition: Secondary | ICD-10-CM | POA: Diagnosis not present

## 2020-01-03 DIAGNOSIS — R399 Unspecified symptoms and signs involving the genitourinary system: Secondary | ICD-10-CM

## 2020-01-03 LAB — POCT URINALYSIS DIP (MANUAL ENTRY)
Bilirubin, UA: NEGATIVE
Blood, UA: NEGATIVE
Glucose, UA: NEGATIVE mg/dL
Ketones, POC UA: NEGATIVE mg/dL
Leukocytes, UA: NEGATIVE
Nitrite, UA: NEGATIVE
Protein Ur, POC: NEGATIVE mg/dL
Spec Grav, UA: 1.025 (ref 1.010–1.025)
Urobilinogen, UA: 0.2 E.U./dL
pH, UA: 5.5 (ref 5.0–8.0)

## 2020-01-03 NOTE — Patient Instructions (Addendum)
If you have lab work done today you will be contacted with your lab results within the next 2 weeks.  If you have not heard from [place] then please contact us. The fastest way to get your results is to register for My Chart.   IF you received an x-ray today, you will receive an invoice from [facility]. Please contact [facility] at [phone] with questions or concerns regarding your invoice.   IF you received labwork today, you will receive an invoice from [facility]. Please contact [facility] at [phone] with questions or concerns regarding your invoice.   Our billing staff will not be able to assist you with questions regarding bills from these companies.  You will be contacted with the lab results as soon as they are available. The fastest way to get your results is to activate your My Chart account. Instructions are located on the last page of this paperwork. If you have not heard from [place] regarding the results in 2 weeks, please contact this office.     Urinary Frequency, Adult Urinary frequency means urinating more often than usual. You may urinate every 1-2 hours even though you drink a normal amount of fluid and do not have a bladder infection or condition. Although you urinate more often than normal, the total amount of urine produced in a day is normal. With urinary frequency, you may have an urgent need to urinate often. The stress and anxiety of needing to find a bathroom quickly can make this urge worse. This condition may go away on its own or you may need treatment at home. Home treatment may include bladder training, exercises, taking medicines, or making changes to your diet. Follow these instructions at home: Bladder health   Keep a bladder diary if told by your health care provider. Keep track of: ? What you eat and drink. ? How often you urinate. ? How much you urinate.  Follow a bladder training program if told by your health care provider. This  may include: ? Learning to delay going to the bathroom. ? Double urinating (voiding). This helps if you are not completely emptying your bladder. ? Scheduled voiding.  Do Kegel exercises as told by your health care provider. Kegel exercises strengthen the muscles that help control urination, which may help the condition. Eating and drinking  If told by your health care provider, make diet changes, such as: ? Avoiding caffeine. ? Drinking fewer fluids, especially alcohol. ? Not drinking in the evening. ? Avoiding foods or drinks that may irritate the bladder. These include coffee, tea, soda, artificial sweeteners, citrus, tomato-based foods, and chocolate. ? Eating foods that help prevent or ease constipation. Constipation can make this condition worse. Your health care provider may recommend that you:  Drink enough fluid to keep your urine pale yellow.  Take over-the-counter or prescription medicines.  Eat foods that are high in fiber, such as beans, whole grains, and fresh fruits and vegetables.  Limit foods that are high in fat and processed sugars, such as fried or sweet foods. General instructions  Take over-the-counter and prescription medicines only as told by your health care provider.  Keep all follow-up visits as told by your health care provider. This is important. Contact a health care provider if:  You start urinating more often.  You feel pain or irritation when you urinate.  You notice blood in your urine.  Your urine looks cloudy.  You develop a fever.  You begin vomiting. Get  help right away if:  You are unable to urinate. Summary  Urinary frequency means urinating more often than usual. With urinary frequency, you may urinate every 1-2 hours even though you drink a normal amount of fluid and do not have a bladder infection or other bladder condition.  Your health care provider may recommend that you keep a bladder diary, follow a bladder training  program, or make dietary changes.  If told by your health care provider, do Kegel exercises to strengthen the muscles that help control urination.  Take over-the-counter and prescription medicines only as told by your health care provider.  Contact a health care provider if your symptoms do not improve or get worse. This information is not intended to replace advice given to you by your health care provider. Make sure you discuss any questions you have with your health care provider. Document Revised: 09/29/2017 Document Reviewed: 09/29/2017 Elsevier Patient Education  [place].

## 2020-01-03 NOTE — Progress Notes (Signed)
[name] 69 y.o.   Chief Complaint  Patient presents with   KIDNEY FUNCTION    per patient to have labs to check kidney function    HISTORY OF PRESENT ILLNESS: This is a 69 y.o. male diabetic patient of Dr. Carlota Raspberry, complaining of urinary frequency.  Wants to have his kidney function checked. No other symptoms. No other complaints or medical concerns today.  HPI   Prior to Admission medications   Medication Sig Start Date End Date Taking? Authorizing Provider  albuterol (VENTOLIN HFA) 108 (90 Base) MCG/ACT inhaler Inhale 1-2 puffs into the lungs every 4 (four) hours as needed for wheezing or shortness of breath. 10/22/19  Yes Wendie Agreste, MD  aspirin EC 81 MG tablet Take 81 mg by mouth daily. Swallow whole.   Yes [provider]  fluticasone (FLONASE) 50 MCG/ACT nasal spray PLACE 1-2 SPRAYS INTO BOTH NOSTRILS DAILY. 09/21/19  Yes Wendie Agreste, MD  glipiZIDE (GLUCOTROL XL) 5 MG 24 hr tablet TAKE 1 TABLET BY MOUTH TWICE A DAY 09/17/19  Yes [name], MD  hydrALAZINE (APRESOLINE) 25 MG tablet Take 1 tablet (25 mg total) by mouth in the morning and at bedtime. 08/29/19  Yes Weaver, Scott T, PA-C  hydrochlorothiazide (HYDRODIURIL) 25 MG tablet TAKE 1 TABLET BY MOUTH EVERY DAY 05/25/19  Yes Belva Crome, MD  insulin detemir (LEVEMIR FLEXTOUCH) 100 UNIT/ML FlexPen INJECT 16 UNITS INTO THE SKIN DAILY AT 10 PM.(1 BOX =140 DAYS) 09/28/19  Yes [name], MD  losartan (COZAAR) 100 MG tablet TAKE 1 TABLET BY MOUTH EVERY DAY 05/25/19  Yes Belva Crome, MD  metFORMIN (GLUCOPHAGE) 500 MG tablet TAKE 2 TABLETS (1,000 MG TOTAL) BY MOUTH 2 (TWO) TIMES DAILY WITH A MEAL. 07/31/19  Yes [name], MD  metoprolol tartrate (LOPRESSOR) 50 MG tablet TAKE 1 TABLET BY MOUTH TWICE A DAY 11/01/19  Yes Belva Crome, MD  nitroGLYCERIN (NITROSTAT) 0.4 MG SL tablet PLACE 1 TABLET UNDER THE TONGUE EVERY 5 MIN X 3 DOSES AS NEEDED FOR CHEST PAIN 08/02/19  Yes Belva Crome, MD   sertraline (ZOLOFT) 25 MG tablet Take 1 tablet (25 mg total) by mouth daily. 12/28/19  Yes Wendie Agreste, MD  Blood Glucose Monitoring Suppl (RELION PREMIER BLU MONITOR) DEVI 1 Device by Does not apply route daily. 07/12/16   [name], MD  glucose blood test strip Use as instructed to check sugar 2 times daily 07/12/16   [name], MD  Insulin Pen Needle (NOVOFINE PLUS) 32G X 4 MM MISC Use 1x a day 05/09/14   [name], MD  isosorbide mononitrate (IMDUR) 60 MG 24 hr tablet Take 1 tablet (60 mg total) by mouth daily. Patient not taking: Reported on 01/03/2020 11/22/19   Belva Crome, MD  pravastatin (PRAVACHOL) 40 MG tablet TAKE 1 TABLET ONCE DAILY Patient not taking: Reported on 01/03/2020 05/30/19   Wendie Agreste, MD  Semaglutide, 1 MG/DOSE, 2 MG/1.5ML SOPN Inject into the skin once a week. Patient not taking: Reported on 01/03/2020    [provider]    Allergies  Allergen Reactions   Statins Other (See Comments)    REACTION: joint pain Lipitor- headaches Has also tried Livalo, pravachol, zetia, crestor, welchol   Imdur [Isosorbide Dinitrate]     Headache - "violent"    Patient Active Problem List   Diagnosis Date Noted   Statin myopathy 04/04/2019   Statin intolerance -weakness, stiffness 02/13/2019   Rupture of right supraspinatus  tendon 01/18/2019   Complex tear of medial meniscus of left knee 09/14/2018   Malignant neoplasm of prostate ([place]) 01/04/2018   Refusal of blood transfusions as patient is Jehovah's Witness    Pneumonia    Osteoarthritis    Obstructive sleep apnea    Hyperlipidemia    HTN (hypertension)    GERD (gastroesophageal reflux disease)    Depression    Coronary artery disease    Chronic lower back pain    Chest pain    Arthritis    Anemia    Benign essential HTN 01/03/2017   PAT (paroxysmal atrial tachycardia) ([place]) 10/27/2015   Type 2 diabetes mellitus with circulatory disorder, without  long-term current use of insulin ([place]) 06/19/2015   Angina pectoris ([place])    Elevated PSA 12/20/2014   CAD (coronary artery disease)    Obstructive sleep apnea 09/06/2014   Personal history of colonic polyps 12/13/2013   Blood in stool 12/13/2013   Prostatitis, acute 12/13/2013   Insomnia 04/19/2013   Obesity with body mass index of 30.0-39.9 10/18/2012   Right lumbar radiculitis 09/29/2011   Adenomatous colon polyp 04/05/2009   ORGANIC IMPOTENCE 11/11/2008   Hyperlipidemia LDL goal <70 08/23/2008   GERD 08/23/2008   BPH (benign prostatic hypertrophy) with urinary obstruction 08/23/2008   OSTEOARTHRITIS 08/23/2008    Past Medical History:  Diagnosis Date   Adenomatous colon polyp 2011   Anemia    Angina pectoris ([place]) 05/01/2015   med rx for 95% OM (u/a to access due to tortuosity), 95% D1 and other moderate CAD at cath   Arthritis    "back, knees" (05/01/2015)   Chest pain    pleuritic   Chronic lower back pain    Complex tear of medial meniscus of left knee 09/14/2018   Coronary artery disease    Depression    GERD (gastroesophageal reflux disease)    HTN (hypertension)    Hyperlipidemia    Obstructive sleep apnea    noncompliant with CPAP   Osteoarthritis    Pneumonia ~ 2014 X 1   Prostate cancer ([place])    Refusal of blood transfusions as patient is Jehovah's Witness    Rotator cuff arthropathy, right    Rupture of right supraspinatus tendon 01/18/2019   Type II or unspecified type diabetes mellitus without mention of complication, not stated as uncontrolled     Past Surgical History:  Procedure Laterality Date   CARDIAC CATHETERIZATION  2004   patent coronary arteries   CARDIAC CATHETERIZATION  05/01/2015   "tried to put stent in but couldn't"   CARDIAC CATHETERIZATION N/A 05/01/2015   Procedure: Left Heart Cath and Coronary Angiography;  Surgeon: Belva Crome, MD; LAD 60%, oD1 90%, pD1 70%, D2 70%, CFX patent stent, 30%  distal to prev stent, pRCA 20%, OM1 90/95%, NL LV   CARDIAC CATHETERIZATION N/A 05/01/2015   Procedure: Coronary Stent Intervention;  Surgeon: Belva Crome, MD;  Unsuccessful PCI OM due to tortuosity   CARDIAC CATHETERIZATION N/A 11/06/2014   Procedure: Left Heart Cath and Coronary Angiography;  Surgeon: Belva Crome, MD;  Location: [facility];  Service: Cardiovascular;  Laterality: N/A;   CLOSED REDUCTION SHOULDER DISLOCATION Right ~ 1975   "& reattached muscle"   COLONOSCOPY W/ BIOPSIES AND POLYPECTOMY  X 2   ESOPHAGOGASTRODUODENOSCOPY ENDOSCOPY     KNEE ARTHROSCOPY WITH MEDIAL MENISECTOMY Left 09/14/2018   Procedure: LEFT KNEE ARTHROSCOPY CHONDROPLASY,  WITH MEDIAL MENISECTOMY;  Surgeon: Marchia Bond, MD;  Location: [facility];  Service: Orthopedics;  Laterality: Left;   PILONIDAL CYST EXCISION     SHOULDER ARTHROSCOPY WITH DEBRIDEMENT AND BICEP TENDON REPAIR Right 01/18/2019   Procedure: RIGHT SHOULDER ARTHROSCOPY WITH DEBRIDEMENT AND DECOMPRSSION SUBACROMIAL PARTIAL ACROMIOPLASTY;  Surgeon: Marchia Bond, MD;  Location: [facility];  Service: Orthopedics;  Laterality: Right;   SHOULDER ARTHROSCOPY WITH ROTATOR CUFF REPAIR Right 01/18/2019   Procedure: SHOULDER ARTHROSCOPY WITH ROTATOR CUFF REPAIR;  Surgeon: Marchia Bond, MD;  Location: [facility];  Service: Orthopedics;  Laterality: Right;    Social History   Socioeconomic History   Marital status: Married    Spouse name: [name]   Number of children: 4   Years of education: Not on file   Highest education level: Not on file  Occupational History   Occupation: Retired    Fish farm manager: UNEMPLOYED  Tobacco Use   Smoking status: Former Smoker    Packs/day: 0.50    Years: 10.00    Pack years: 5.00    Types: Cigarettes    Quit date: 04/06/1983    Years since quitting: 36.7   Smokeless tobacco: Never Used  Vaping Use   Vaping Use: Never used  Substance and  Sexual Activity   Alcohol use: Yes    Alcohol/week: 0.0 standard drinks    Comment: `/26/2017 "might drink a beer q couple months mostly; summertime I might drink 2-3 beers/week"   Drug use: No   Sexual activity: Not Currently  Other Topics Concern   Not on file  Social History Narrative   Lives in [place] with spouse Marzell Isakson).  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   Social Determinants of Health   Financial Resource Strain:    Difficulty of Paying Living Expenses: Not on file  Food Insecurity:    Worried About Charity fundraiser in the Last Year: Not on file   [employer] of Food in the Last Year: Not on file  Transportation Needs:    Lack of Transportation (Medical): Not on file   Lack of Transportation (Non-Medical): Not on file  Physical Activity:    Days of Exercise per Week: Not on file   Minutes of Exercise per Session: Not on file  Stress:    Feeling of Stress : Not on file  Social Connections:    Frequency of Communication with Friends and Family: Not on file   Frequency of Social Gatherings with Friends and Family: Not on file   Attends Religious Services: Not on file   Active Member of Clubs or Organizations: Not on file   Attends Archivist Meetings: Not on file   Marital Status: Not on file  Intimate Partner Violence:    Fear of Current or Ex-Partner: Not on file   Emotionally Abused: Not on file   Physically Abused: Not on file   Sexually Abused: Not on file    Family History  Problem Relation Age of Onset   Coronary artery disease Other        CABG   Coronary artery disease Mother    Stroke Mother    Heart disease Mother    Diabetes Mother    Other Father    Diabetes Sister    Diabetes Maternal Uncle        x2   Alcohol abuse Other    Diabetes Paternal Uncle        x2   Colon  cancer Paternal Uncle    Colon polyps Neg Hx    Esophageal cancer  Neg Hx      Review of Systems  Constitutional: Negative.  Negative for chills and fever.  HENT: Negative.  Negative for congestion and sore throat.   Respiratory: Negative.  Negative for cough and shortness of breath.   Cardiovascular: Negative for chest pain and palpitations.  Gastrointestinal: Negative for abdominal pain, nausea and vomiting.  Genitourinary: Positive for frequency. Negative for dysuria, flank pain and hematuria.  Skin: Negative.  Negative for rash.  Neurological: Negative.  Negative for dizziness and headaches.  All other systems reviewed and are negative.   Today's Vitals   01/03/20 0925  BP: (!) 175/80  Pulse: 66  Resp: 16  Temp: 98.3 F (36.8 C)  TempSrc: Temporal  SpO2: 99%  Weight: 258 lb (117 kg)  Height: 5\' 10"  (1.778 m)   Body mass index is 37.02 kg/m.  Physical Exam Vitals reviewed.  Constitutional:      Appearance: Normal appearance.  HENT:     Head: Normocephalic.  Eyes:     Extraocular Movements: Extraocular movements intact.  Cardiovascular:     Rate and Rhythm: Normal rate.  Pulmonary:     Effort: Pulmonary effort is normal.  Musculoskeletal:     Cervical back: Normal range of motion.  Neurological:     Mental Status: He is alert and oriented to person, place, and time.  Psychiatric:        Mood and Affect: Mood normal.        Behavior: Behavior normal.      Results for orders placed or performed in visit on 01/03/20 (from the past 24 hour(s))  POCT urinalysis dipstick     Status: None   Collection Time: 01/03/20 10:05 AM  Result Value Ref Range   Color, UA yellow yellow   Clarity, UA clear clear   Glucose, UA negative negative mg/dL   Bilirubin, UA negative negative   Ketones, POC UA negative negative mg/dL   Spec Grav, UA 1.025 1.010 - 1.025   Blood, UA negative negative   pH, UA 5.5 5.0 - 8.0   Protein Ur, POC negative negative mg/dL   Urobilinogen, UA 0.2 0.2 or 1.0 E.U./dL   Nitrite, UA Negative Negative    Leukocytes, UA Negative Negative     ASSESSMENT & PLAN: [name] was seen today for kidney function.  Diagnoses and all orders for this visit:  Lower urinary tract symptoms -     Comprehensive metabolic panel -     POCT urinalysis dipstick  Urinary frequency -     Comprehensive metabolic panel -     POCT urinalysis dipstick    Patient Instructions       If you have lab work done today you will be contacted with your lab results within the next 2 weeks.  If you have not heard from [place] then please contact us. The fastest way to get your results is to register for My Chart.   IF you received an x-ray today, you will receive an invoice from [facility]. Please contact [facility] at [phone] with questions or concerns regarding your invoice.   IF you received labwork today, you will receive an invoice from [facility]. Please contact [facility] at [phone] with questions or concerns regarding your invoice.   Our billing staff will not be able to assist you with questions regarding bills from these companies.  You will be contacted with the lab results  as soon as they are available. The fastest way to get your results is to activate your My Chart account. Instructions are located on the last page of this paperwork. If you have not heard from [place] regarding the results in 2 weeks, please contact this office.     Urinary Frequency, Adult Urinary frequency means urinating more often than usual. You may urinate every 1-2 hours even though you drink a normal amount of fluid and do not have a bladder infection or condition. Although you urinate more often than normal, the total amount of urine produced in a day is normal. With urinary frequency, you may have an urgent need to urinate often. The stress and anxiety of needing to find a bathroom quickly can make this urge worse. This condition may go away on its own or you may need treatment at home. Home treatment may  include bladder training, exercises, taking medicines, or making changes to your diet. Follow these instructions at home: Bladder health   Keep a bladder diary if told by your health care provider. Keep track of: ? What you eat and drink. ? How often you urinate. ? How much you urinate.  Follow a bladder training program if told by your health care provider. This may include: ? Learning to delay going to the bathroom. ? Double urinating (voiding). This helps if you are not completely emptying your bladder. ? Scheduled voiding.  Do Kegel exercises as told by your health care provider. Kegel exercises strengthen the muscles that help control urination, which may help the condition. Eating and drinking  If told by your health care provider, make diet changes, such as: ? Avoiding caffeine. ? Drinking fewer fluids, especially alcohol. ? Not drinking in the evening. ? Avoiding foods or drinks that may irritate the bladder. These include coffee, tea, soda, artificial sweeteners, citrus, tomato-based foods, and chocolate. ? Eating foods that help prevent or ease constipation. Constipation can make this condition worse. Your health care provider may recommend that you:  Drink enough fluid to keep your urine pale yellow.  Take over-the-counter or prescription medicines.  Eat foods that are high in fiber, such as beans, whole grains, and fresh fruits and vegetables.  Limit foods that are high in fat and processed sugars, such as fried or sweet foods. General instructions  Take over-the-counter and prescription medicines only as told by your health care provider.  Keep all follow-up visits as told by your health care provider. This is important. Contact a health care provider if:  You start urinating more often.  You feel pain or irritation when you urinate.  You notice blood in your urine.  Your urine looks cloudy.  You develop a fever.  You begin vomiting. Get help right away  if:  You are unable to urinate. Summary  Urinary frequency means urinating more often than usual. With urinary frequency, you may urinate every 1-2 hours even though you drink a normal amount of fluid and do not have a bladder infection or other bladder condition.  Your health care provider may recommend that you keep a bladder diary, follow a bladder training program, or make dietary changes.  If told by your health care provider, do Kegel exercises to strengthen the muscles that help control urination.  Take over-the-counter and prescription medicines only as told by your health care provider.  Contact a health care provider if your symptoms do not improve or get worse. This information is not intended to replace advice given to you  by your health care provider. Make sure you discuss any questions you have with your health care provider. Document Revised: 09/29/2017 Document Reviewed: 09/29/2017 Elsevier Patient Education  2020 Elsevier Inc.      Agustina Caroli, MD [facility]

## 2020-01-04 LAB — COMPREHENSIVE METABOLIC PANEL
ALT: 15 IU/L (ref 0–44)
AST: 15 IU/L (ref 0–40)
Albumin/Globulin Ratio: 1.5 (ref 1.2–2.2)
Albumin: 4.2 g/dL (ref 3.8–4.8)
Alkaline Phosphatase: 90 IU/L (ref 44–121)
BUN/Creatinine Ratio: 11 (ref 10–24)
BUN: 11 mg/dL (ref 8–27)
Bilirubin Total: 0.3 mg/dL (ref 0.0–1.2)
CO2: 24 mmol/L (ref 20–29)
Calcium: 9.1 mg/dL (ref 8.6–10.2)
Chloride: 105 mmol/L (ref 96–106)
Creatinine, Ser: 0.98 mg/dL (ref 0.76–1.27)
GFR calc Af Amer: 91 mL/min/{1.73_m2} (ref >59)
GFR calc non Af Amer: 78 mL/min/{1.73_m2} (ref >59)
Globulin, Total: 2.8 g/dL (ref 1.5–4.5)
Glucose: 122 mg/dL — ABNORMAL HIGH (ref 65–99)
Potassium: 4.2 mmol/L (ref 3.5–5.2)
Sodium: 144 mmol/L (ref 134–144)
Total Protein: 7 g/dL (ref 6.0–8.5)

## 2020-01-14 ENCOUNTER — Ambulatory Visit: Payer: Federal, State, Local not specified - PPO | Attending: Family Medicine | Admitting: Audiologist

## 2020-01-14 ENCOUNTER — Ambulatory Visit: Payer: Federal, State, Local not specified - PPO | Admitting: Family Medicine

## 2020-01-17 ENCOUNTER — Ambulatory Visit: Payer: Federal, State, Local not specified - PPO | Admitting: Audiologist

## 2020-01-23 ENCOUNTER — Other Ambulatory Visit: Payer: Self-pay | Admitting: Internal Medicine

## 2020-01-28 ENCOUNTER — Ambulatory Visit (INDEPENDENT_AMBULATORY_CARE_PROVIDER_SITE_OTHER): Payer: Federal, State, Local not specified - PPO | Admitting: Family Medicine

## 2020-01-28 ENCOUNTER — Encounter: Payer: Self-pay | Admitting: Family Medicine

## 2020-01-28 ENCOUNTER — Other Ambulatory Visit: Payer: Self-pay

## 2020-01-28 VITALS — BP 150/86 | HR 62 | Temp 97.5°F | Ht 70.0 in | Wt 258.0 lb

## 2020-01-28 DIAGNOSIS — R5383 Other fatigue: Secondary | ICD-10-CM | POA: Diagnosis not present

## 2020-01-28 DIAGNOSIS — F32A Depression, unspecified: Secondary | ICD-10-CM

## 2020-01-28 DIAGNOSIS — R413 Other amnesia: Secondary | ICD-10-CM | POA: Diagnosis not present

## 2020-01-28 NOTE — Patient Instructions (Addendum)
  Glad to hear that your fatigue and depression symptoms have improved.  Continue 1 full pill of sertraline per day.  Recheck with me in 3 months.  Check your blood pressures outside of the office and if those run above 140/90, follow-up and we can review your medications further.  Let me know if there are questions.  Return to the clinic or go to the nearest emergency room if any of your symptoms worsen or new symptoms occur.    If you have lab work done today you will be contacted with your lab results within the next 2 weeks.  If you have not heard from [place] then please contact us. The fastest way to get your results is to register for My Chart.   IF you received an x-ray today, you will receive an invoice from [facility]. Please contact [facility] at [phone] with questions or concerns regarding your invoice.   IF you received labwork today, you will receive an invoice from [facility]. Please contact [facility] at [phone] with questions or concerns regarding your invoice.   Our billing staff will not be able to assist you with questions regarding bills from these companies.  You will be contacted with the lab results as soon as they are available. The fastest way to get your results is to activate your My Chart account. Instructions are located on the last page of this paperwork. If you have not heard from [place] regarding the results in 2 weeks, please contact this office.

## 2020-01-28 NOTE — Progress Notes (Signed)
Subjective:  Patient ID: [name], male    DOB: 1950/04/29  Age: 69 y.o. MRN: [id number]  CC:  Chief Complaint  Patient presents with  . Follow-up    ON HIS DEPRESSION, STRESS AND FATIGUE. Pt reports he is feeling alot better since last OV. PT states he actualy feels like getting up and doing stuff now. Pt states his stress hasn't changed, but the depression and fatigue have greatly improved.    HPI [name] presents for   Fatigue: Follow-up from September visit.  Thought to be multifactorial including with CPAP usage, situational stressors, depression.  Fatigue component had improved in September but thought to have depression recurrence.  Some memory difficulty and transient confusion discussed at that time.  Referred to neurology and was increased on Zoloft to a full 25 mg tablet daily, previously half tablet.    As above, his energy has improved, motivation has improved.  Fatigue has improved.  Still some stressors. Denies any recurrence of feeling lost or confused, exercising more.  No new side effects on higher dose of sertraline.  Neuro appt 03/10/20  Taking meds as prescribed. No missed doses of antihypertensives - rushing to get here today. Using cpap nightly.   Urinary symptoms resolved.   GAD 7 : Generalized Anxiety Score 01/28/2020 03/15/2019  Nervous, Anxious, on Edge 0 0  Control/stop worrying 0 1  Worry too much - different things 0 1  Trouble relaxing 0 0  Restless 0 0  Easily annoyed or irritable 0 0  Afraid - awful might happen 0 0  Total GAD 7 Score 0 2  Anxiety Difficulty - Not difficult at all     Depression screen [facility] 2/9 01/28/2020 01/03/2020 12/31/2019 10/22/2019 06/11/2019  Decreased Interest 1 0 0 0 0  Down, Depressed, Hopeless 0 0 0 0 0  PHQ - 2 Score 1 0 0 0 0  Altered sleeping 0 - - - -  Tired, decreased energy 0 - - - -  Change in appetite 0 - - - -  Feeling bad or failure about yourself  0 - - - -  Trouble concentrating 1 - - - -    Moving slowly or fidgety/restless 0 - - - -  Suicidal thoughts 0 - - - -  PHQ-9 Score 2 - - - -  Difficult doing work/chores - - - - -  Some recent data might be hidden      History Patient Active Problem List   Diagnosis Date Noted  . Statin myopathy 04/04/2019  . Statin intolerance -weakness, stiffness 02/13/2019  . Rupture of right supraspinatus tendon 01/18/2019  . Complex tear of medial meniscus of left knee 09/14/2018  . Malignant neoplasm of prostate ([place]) 01/04/2018  . Refusal of blood transfusions as patient is Jehovah's Witness   . Pneumonia   . Osteoarthritis   . Obstructive sleep apnea   . Hyperlipidemia   . HTN (hypertension)   . GERD (gastroesophageal reflux disease)   . Depression   . Coronary artery disease   . Chronic lower back pain   . Chest pain   . Arthritis   . Anemia   . Benign essential HTN 01/03/2017  . PAT (paroxysmal atrial tachycardia) ([place]) 10/27/2015  . Type 2 diabetes mellitus with circulatory disorder, without long-term current use of insulin ([place]) 06/19/2015  . Angina pectoris ([place])   . Elevated PSA 12/20/2014  . CAD (coronary artery disease)   . Obstructive sleep apnea 09/06/2014  .  Personal history of colonic polyps 12/13/2013  . Blood in stool 12/13/2013  . Prostatitis, acute 12/13/2013  . Insomnia 04/19/2013  . Obesity with body mass index of 30.0-39.9 10/18/2012  . Right lumbar radiculitis 09/29/2011  . Adenomatous colon polyp 04/05/2009  . ORGANIC IMPOTENCE 11/11/2008  . Hyperlipidemia LDL goal <70 08/23/2008  . GERD 08/23/2008  . BPH (benign prostatic hypertrophy) with urinary obstruction 08/23/2008  . OSTEOARTHRITIS 08/23/2008   Past Medical History:  Diagnosis Date  . Adenomatous colon polyp 2011  . Anemia   . Angina pectoris ([place]) 05/01/2015   med rx for 95% OM (u/a to access due to tortuosity), 95% D1 and other moderate CAD at cath  . Arthritis    "back, knees" (05/01/2015)  . Chest pain    pleuritic  . Chronic  lower back pain   . Complex tear of medial meniscus of left knee 09/14/2018  . Coronary artery disease   . Depression   . GERD (gastroesophageal reflux disease)   . HTN (hypertension)   . Hyperlipidemia   . Obstructive sleep apnea    noncompliant with CPAP  . Osteoarthritis   . Pneumonia ~ 2014 X 1  . Prostate cancer ([place])   . Refusal of blood transfusions as patient is Jehovah's Witness   . Rotator cuff arthropathy, right   . Rupture of right supraspinatus tendon 01/18/2019  . Type II or unspecified type diabetes mellitus without mention of complication, not stated as uncontrolled    Past Surgical History:  Procedure Laterality Date  . CARDIAC CATHETERIZATION  2004   patent coronary arteries  . CARDIAC CATHETERIZATION  05/01/2015   "tried to put stent in but couldn't"  . CARDIAC CATHETERIZATION N/A 05/01/2015   Procedure: Left Heart Cath and Coronary Angiography;  Surgeon: Belva Crome, MD; LAD 60%, oD1 90%, pD1 70%, D2 70%, CFX patent stent, 30% distal to prev stent, pRCA 20%, OM1 90/95%, NL LV  . CARDIAC CATHETERIZATION N/A 05/01/2015   Procedure: Coronary Stent Intervention;  Surgeon: Belva Crome, MD;  Unsuccessful PCI OM due to tortuosity  . CARDIAC CATHETERIZATION N/A 11/06/2014   Procedure: Left Heart Cath and Coronary Angiography;  Surgeon: Belva Crome, MD;  Location: [facility];  Service: Cardiovascular;  Laterality: N/A;  . CLOSED REDUCTION SHOULDER DISLOCATION Right ~ 1975   "& reattached muscle"  . COLONOSCOPY W/ BIOPSIES AND POLYPECTOMY  X 2  . ESOPHAGOGASTRODUODENOSCOPY ENDOSCOPY    . KNEE ARTHROSCOPY WITH MEDIAL MENISECTOMY Left 09/14/2018   Procedure: LEFT KNEE ARTHROSCOPY CHONDROPLASY,  WITH MEDIAL MENISECTOMY;  Surgeon: Marchia Bond, MD;  Location: [facility];  Service: Orthopedics;  Laterality: Left;  . PILONIDAL CYST EXCISION    . SHOULDER ARTHROSCOPY WITH DEBRIDEMENT AND BICEP TENDON REPAIR Right 01/18/2019   Procedure: RIGHT SHOULDER  ARTHROSCOPY WITH DEBRIDEMENT AND DECOMPRSSION SUBACROMIAL PARTIAL ACROMIOPLASTY;  Surgeon: Marchia Bond, MD;  Location: [facility];  Service: Orthopedics;  Laterality: Right;  . SHOULDER ARTHROSCOPY WITH ROTATOR CUFF REPAIR Right 01/18/2019   Procedure: SHOULDER ARTHROSCOPY WITH ROTATOR CUFF REPAIR;  Surgeon: Marchia Bond, MD;  Location: [facility];  Service: Orthopedics;  Laterality: Right;   Allergies  Allergen Reactions  . Statins Other (See Comments)    REACTION: joint pain Lipitor- headaches Has also tried Livalo, pravachol, zetia, crestor, welchol  . Imdur [Isosorbide Dinitrate]     Headache - "violent"   Prior to Admission medications   Medication Sig Start Date End Date Taking? Authorizing Provider  albuterol (VENTOLIN HFA) 108 (90 Base) MCG/ACT inhaler Inhale 1-2 puffs into the lungs every 4 (four) hours as needed for wheezing or shortness of breath. 10/22/19  Yes Wendie Agreste, MD  aspirin EC 81 MG tablet Take 81 mg by mouth daily. Swallow whole.   Yes [provider]  Blood Glucose Monitoring Suppl (RELION PREMIER BLU MONITOR) DEVI 1 Device by Does not apply route daily. 07/12/16  Yes [name], MD  fluticasone (FLONASE) 50 MCG/ACT nasal spray PLACE 1-2 SPRAYS INTO BOTH NOSTRILS DAILY. 09/21/19  Yes Wendie Agreste, MD  glipiZIDE (GLUCOTROL XL) 5 MG 24 hr tablet TAKE 1 TABLET BY MOUTH TWICE A DAY 09/17/19  Yes [name], MD  glucose blood test strip Use as instructed to check sugar 2 times daily 07/12/16  Yes [name], MD  hydrALAZINE (APRESOLINE) 25 MG tablet Take 1 tablet (25 mg total) by mouth in the morning and at bedtime. 08/29/19  Yes Weaver, Scott T, PA-C  hydrochlorothiazide (HYDRODIURIL) 25 MG tablet TAKE 1 TABLET BY MOUTH EVERY DAY 05/25/19  Yes Belva Crome, MD  insulin detemir (LEVEMIR FLEXTOUCH) 100 UNIT/ML FlexPen INJECT 16 UNITS INTO THE SKIN DAILY AT 10 PM.(1 BOX =140 DAYS) 09/28/19  Yes [name], MD  Insulin Pen Needle (NOVOFINE PLUS) 32G X 4 MM MISC Use 1x a day 05/09/14  Yes [name], MD  isosorbide mononitrate (IMDUR) 60 MG 24 hr tablet Take 1 tablet (60 mg total) by mouth daily. 11/22/19  Yes Belva Crome, MD  losartan (COZAAR) 100 MG tablet TAKE 1 TABLET BY MOUTH EVERY DAY 05/25/19  Yes Belva Crome, MD  metFORMIN (GLUCOPHAGE) 500 MG tablet TAKE 2 TABLETS (1,000 MG TOTAL) BY MOUTH 2 (TWO) TIMES DAILY WITH A MEAL. 01/23/20  Yes [name], MD  metoprolol tartrate (LOPRESSOR) 50 MG tablet TAKE 1 TABLET BY MOUTH TWICE A DAY 11/01/19  Yes Belva Crome, MD  nitroGLYCERIN (NITROSTAT) 0.4 MG SL tablet PLACE 1 TABLET UNDER THE TONGUE EVERY 5 MIN X 3 DOSES AS NEEDED FOR CHEST PAIN 08/02/19  Yes Belva Crome, MD  pravastatin (PRAVACHOL) 40 MG tablet TAKE 1 TABLET ONCE DAILY 05/30/19  Yes Wendie Agreste, MD  Semaglutide, 1 MG/DOSE, 2 MG/1.5ML SOPN Inject into the skin once a week.    Yes [provider]  sertraline (ZOLOFT) 25 MG tablet Take 1 tablet (25 mg total) by mouth daily. 12/28/19  Yes Wendie Agreste, MD   Social History   Socioeconomic History  . Marital status: Married    Spouse name: [name]  . Number of children: 4  . Years of education: Not on file  . Highest education level: Not on file  Occupational History  . Occupation: Retired    Fish farm manager: UNEMPLOYED  Tobacco Use  . Smoking status: Former Smoker    Packs/day: 0.50    Years: 10.00    Pack years: 5.00    Types: Cigarettes    Quit date: 04/06/1983    Years since quitting: 36.8  . Smokeless tobacco: Never Used  Vaping Use  . Vaping Use: Never used  Substance and Sexual Activity  . Alcohol use: Yes    Alcohol/week: 0.0 standard drinks    Comment: `/26/2017 "might drink a beer q couple months mostly; summertime I might drink 2-3 beers/week"  . Drug use: No  . Sexual activity: Not Currently  Other Topics Concern  . Not on file  Social History Narrative   Lives in  [place]  with spouse Elston Aldape).  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   Social Determinants of Health   Financial Resource Strain:   . Difficulty of Paying Living Expenses: Not on file  Food Insecurity:   . Worried About Charity fundraiser in the Last Year: Not on file  . Ran Out of Food in the Last Year: Not on file  Transportation Needs:   . Lack of Transportation (Medical): Not on file  . Lack of Transportation (Non-Medical): Not on file  Physical Activity:   . Days of Exercise per Week: Not on file  . Minutes of Exercise per Session: Not on file  Stress:   . Feeling of Stress : Not on file  Social Connections:   . Frequency of Communication with Friends and Family: Not on file  . Frequency of Social Gatherings with Friends and Family: Not on file  . Attends Religious Services: Not on file  . Active Member of Clubs or Organizations: Not on file  . Attends Archivist Meetings: Not on file  . Marital Status: Not on file  Intimate Partner Violence:   . Fear of Current or Ex-Partner: Not on file  . Emotionally Abused: Not on file  . Physically Abused: Not on file  . Sexually Abused: Not on file    Review of Systems Per HPI.   Objective:   Vitals:   01/28/20 1409 01/28/20 1413  BP: (!) 190/88 (!) 150/86  Pulse: 62   Temp: (!) 97.5 F (36.4 C)   TempSrc: Temporal   SpO2: 98%   Weight: 258 lb (117 kg)   Height: 5\' 10"  (1.778 m)    BP Readings from Last 3 Encounters:  01/28/20 (!) 150/86  01/03/20 (!) 175/80  12/31/19 (!) 143/82      Physical Exam Vitals reviewed.  Constitutional:      Appearance: He is well-developed.  HENT:     Head: Normocephalic and atraumatic.  Eyes:     Pupils: Pupils are equal, round, and reactive to light.  Neck:     Vascular: No carotid bruit or JVD.  Cardiovascular:     Rate and Rhythm: Normal rate and regular rhythm.     Heart sounds: Normal heart sounds.  No murmur heard.   Pulmonary:     Effort: Pulmonary effort is normal.     Breath sounds: Normal breath sounds. No rales.  Musculoskeletal:     Right lower leg: Edema (trace-1+ bilaterally. ) present.     Left lower leg: Edema present.  Skin:    General: Skin is warm and dry.  Neurological:     Mental Status: He is alert and oriented to person, place, and time.     Assessment & Plan:  REDMOND WHITTLEY is a 69 y.o. male . Fatigue, unspecified type Depression, unspecified depression type Memory difficulties  -Improved on higher dose of sertraline, continue same. Has follow-up planned with neurology for memory evaluation in December. Slight elevated BP initially with rushing here today, improved on recheck. Home monitoring with RTC precautions given. No med changes at this time.   No orders of the defined types were placed in this encounter.  Patient Instructions    Glad to hear that your fatigue and depression symptoms have improved.  Continue 1 full pill of sertraline per day.  Recheck with me in 3 months.  Check your blood pressures outside of the office and if those  run above 140/90, follow-up and we can review your medications further.  Let me know if there are questions.  Return to the clinic or go to the nearest emergency room if any of your symptoms worsen or new symptoms occur.    If you have lab work done today you will be contacted with your lab results within the next 2 weeks.  If you have not heard from [place] then please contact us. The fastest way to get your results is to register for My Chart.   IF you received an x-ray today, you will receive an invoice from [facility]. Please contact [facility] at [phone] with questions or concerns regarding your invoice.   IF you received labwork today, you will receive an invoice from [facility]. Please contact [facility] at [phone] with questions or concerns regarding your invoice.   Our billing staff will  not be able to assist you with questions regarding bills from these companies.  You will be contacted with the lab results as soon as they are available. The fastest way to get your results is to activate your My Chart account. Instructions are located on the last page of this paperwork. If you have not heard from [place] regarding the results in 2 weeks, please contact this office.         Signed, [name], MD [facility]

## 2020-01-31 ENCOUNTER — Other Ambulatory Visit: Payer: Self-pay

## 2020-01-31 ENCOUNTER — Encounter: Payer: Self-pay | Admitting: Internal Medicine

## 2020-01-31 ENCOUNTER — Ambulatory Visit (INDEPENDENT_AMBULATORY_CARE_PROVIDER_SITE_OTHER): Payer: Federal, State, Local not specified - PPO | Admitting: Internal Medicine

## 2020-01-31 VITALS — BP 130/80 | HR 68 | Ht 70.0 in | Wt 256.6 lb

## 2020-01-31 DIAGNOSIS — E785 Hyperlipidemia, unspecified: Secondary | ICD-10-CM | POA: Diagnosis not present

## 2020-01-31 DIAGNOSIS — E1159 Type 2 diabetes mellitus with other circulatory complications: Secondary | ICD-10-CM

## 2020-01-31 DIAGNOSIS — Z789 Other specified health status: Secondary | ICD-10-CM

## 2020-01-31 DIAGNOSIS — E669 Obesity, unspecified: Secondary | ICD-10-CM | POA: Diagnosis not present

## 2020-01-31 LAB — POCT GLYCOSYLATED HEMOGLOBIN (HGB A1C): Hemoglobin A1C: 6.8 % — AB (ref 4.0–5.6)

## 2020-01-31 NOTE — Patient Instructions (Addendum)
Please continue: - Metformin 1000 mg 2x a day - Glipizide XL 5 mg 2x a day - break/crush the evening dose - Ozempic 0.25 mg weekly - Levemir 20 units at bedtime  Please come back for a follow-up appointment in 4 months.

## 2020-01-31 NOTE — Progress Notes (Signed)
Patient ID: [name], male   DOB: July 13, 1950, 69 y.o.   MRN: [id number]  This visit occurred during the SARS-CoV-2 public health emergency.  Safety protocols were in place, including screening questions prior to the visit, additional usage of staff PPE, and extensive cleaning of exam room while observing appropriate contact time as indicated for disinfecting solutions.   HPI: [name] is a 69 y.o.-year-old male, returning for f/u for DM2, dx 2007, insulin-dependent since 2014, uncontrolled, with complications (CAD - s/p NSTEMI 11/2014 - stent; ED; mild CKD). Last visit 4 months ago. PCP: Dr Odette Fraction.  He recently started on Zoloft and his fatigue and depression are much better.  Reviewed HbA1c levels: Lab Results  Component Value Date   HGBA1C 6.7 (A) 09/27/2019   HGBA1C 7.9 (A) 08/13/2019   HGBA1C 8.1 (A) 02/13/2019   Pt is on a regimen of: - Metformin 1000 mg 2x a day - Glipizide XL 5 mg 2x a day -crushes the evening dose - Levemir 20 units at bedtime - Ozempic 0.5 mg weekly >> recommended 10/2018 >> finally started 02/2019 - took 4 doses and stopped for fear of SEs >> restarted 08/2019 at 0.25 mg weekly  He tried Toujeo 20 units at night but did not like it He did not start Januvia as suggested at last visit ("I don't think I need it"). He was Levemir 140 units in am >> decreased to 70 units>> stopped as this was too expensive for him He stopped Welchol 3.7g at bedtime a day. Stopped Farxiga 10 mg b/c frequent urination and urgency. He was on Glipizide in the past. He was on Metformin in the past >> no N/V.   Pt checks his sugars once a day: - am: 150-180 >> 150 >> 130-140 >> 98-100 >> 120-130 - 2h after b'fast: n/c >> 128, 134 >> n/c - before lunch: n/c >> 140s >> n/c >> 100-120 >> n/c - 2h after lunch: n/c >> 140-157 >> n/c  - before dinner:  106-120 >> n/c >> 80-130 >> n/c - 2h after dinner: 200-245 >> 150-180, 200 >> n/c - bedtime:   180-200 >> 230-240 >>  137-140 >> 150 - nighttime:94, 190-305 >> 148-216 >> n/c  Lowest sugar was 130 >> 23!!! when took Ozempic every day by mistake >> 101; he has hypoglycemia awareness in the 80s Highest sugar was 240 >> 180 >> 200 x1.  -+ Mild CKD, last BUN/creatinine:  Lab Results  Component Value Date   BUN 11 01/03/2020   CREATININE 0.98 01/03/2020  09/07/2016: 13/0.9, glucose 133 On losartan. -+ HL; last set of lipids: Lab Results  Component Value Date   CHOL 154 08/29/2019   HDL 38 (L) 08/29/2019   LDLCALC 101 (H) 08/29/2019   LDLDIRECT 131 (H) 03/22/2019   TRIG 78 08/29/2019   CHOLHDL 4.1 08/29/2019  09/07/2016: 167/65/33/121 He was on pravastatin but could not tolerate it due to muscle aches and weakness.  She also stopped Repatha due to muscle weakness. - last eye exam was in 04/03/2019: No DR; Dr. Lady Gary. - He denies  numbness and tingling in his feet.  Pt was admitted for CP in 11/2014: NSTEMI >> CAD >> PTCA >> stent placed.  He was in ED with CP 05/2017 >> no cardiac pathology. He had shoulder surgery 01/18/2019 after rupture of his supraspinatus tendon. In 2020, he had radiotherapy for his prostate cancer (with gold beads).    ROS: Constitutional: + weight gain (but not on our scale  today)/no weight loss, no fatigue, no subjective hyperthermia, no subjective hypothermia Eyes: no blurry vision, no xerophthalmia ENT: no sore throat, no nodules palpated in neck, no dysphagia, no odynophagia, no hoarseness Cardiovascular: no CP/no SOB/no palpitations/no leg swelling Respiratory: no cough/no SOB/no wheezing Gastrointestinal: no N/no V/no D/no C/+ acid reflux (shoulder pain) Musculoskeletal: no muscle aches/no joint aches Skin: no rashes, no hair loss Neurological: no tremors/no numbness/no tingling/no dizziness  I reviewed pt's medications, allergies, PMH, social hx, family hx, and changes were documented in the history of present illness. Otherwise, unchanged from my initial visit  note.  Past Medical History:  Diagnosis Date  . Adenomatous colon polyp 2011  . Anemia   . Angina pectoris ([place]) 05/01/2015   med rx for 95% OM (u/a to access due to tortuosity), 95% D1 and other moderate CAD at cath  . Arthritis    "back, knees" (05/01/2015)  . Chest pain    pleuritic  . Chronic lower back pain   . Complex tear of medial meniscus of left knee 09/14/2018  . Coronary artery disease   . Depression   . GERD (gastroesophageal reflux disease)   . HTN (hypertension)   . Hyperlipidemia   . Obstructive sleep apnea    noncompliant with CPAP  . Osteoarthritis   . Pneumonia ~ 2014 X 1  . Prostate cancer ([place])   . Refusal of blood transfusions as patient is Jehovah's Witness   . Rotator cuff arthropathy, right   . Rupture of right supraspinatus tendon 01/18/2019  . Type II or unspecified type diabetes mellitus without mention of complication, not stated as uncontrolled    Past Surgical History:  Procedure Laterality Date  . CARDIAC CATHETERIZATION  2004   patent coronary arteries  . CARDIAC CATHETERIZATION  05/01/2015   "tried to put stent in but couldn't"  . CARDIAC CATHETERIZATION N/A 05/01/2015   Procedure: Left Heart Cath and Coronary Angiography;  Surgeon: Belva Crome, MD; LAD 60%, oD1 90%, pD1 70%, D2 70%, CFX patent stent, 30% distal to prev stent, pRCA 20%, OM1 90/95%, NL LV  . CARDIAC CATHETERIZATION N/A 05/01/2015   Procedure: Coronary Stent Intervention;  Surgeon: Belva Crome, MD;  Unsuccessful PCI OM due to tortuosity  . CARDIAC CATHETERIZATION N/A 11/06/2014   Procedure: Left Heart Cath and Coronary Angiography;  Surgeon: Belva Crome, MD;  Location: [facility];  Service: Cardiovascular;  Laterality: N/A;  . CLOSED REDUCTION SHOULDER DISLOCATION Right ~ 1975   "& reattached muscle"  . COLONOSCOPY W/ BIOPSIES AND POLYPECTOMY  X 2  . ESOPHAGOGASTRODUODENOSCOPY ENDOSCOPY    . KNEE ARTHROSCOPY WITH MEDIAL MENISECTOMY Left 09/14/2018   Procedure: LEFT  KNEE ARTHROSCOPY CHONDROPLASY,  WITH MEDIAL MENISECTOMY;  Surgeon: Marchia Bond, MD;  Location: [facility];  Service: Orthopedics;  Laterality: Left;  . PILONIDAL CYST EXCISION    . SHOULDER ARTHROSCOPY WITH DEBRIDEMENT AND BICEP TENDON REPAIR Right 01/18/2019   Procedure: RIGHT SHOULDER ARTHROSCOPY WITH DEBRIDEMENT AND DECOMPRSSION SUBACROMIAL PARTIAL ACROMIOPLASTY;  Surgeon: Marchia Bond, MD;  Location: [facility];  Service: Orthopedics;  Laterality: Right;  . SHOULDER ARTHROSCOPY WITH ROTATOR CUFF REPAIR Right 01/18/2019   Procedure: SHOULDER ARTHROSCOPY WITH ROTATOR CUFF REPAIR;  Surgeon: Marchia Bond, MD;  Location: [facility];  Service: Orthopedics;  Laterality: Right;   Social History   Socioeconomic History  . Marital status: Married    Spouse name: [name]  . Number of children: 4  . Years of education: Not on  file  . Highest education level: Not on file  Occupational History  . Occupation: Retired    Fish farm manager: UNEMPLOYED  Tobacco Use  . Smoking status: Former Smoker    Packs/day: 0.50    Years: 10.00    Pack years: 5.00    Types: Cigarettes    Quit date: 04/06/1983    Years since quitting: 36.8  . Smokeless tobacco: Never Used  Vaping Use  . Vaping Use: Never used  Substance and Sexual Activity  . Alcohol use: Yes    Alcohol/week: 0.0 standard drinks    Comment: `/26/2017 "might drink a beer q couple months mostly; summertime I might drink 2-3 beers/week"  . Drug use: No  . Sexual activity: Not Currently  Other Topics Concern  . Not on file  Social History Narrative   Lives in [place] with spouse Senai Kingsley).  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   Social Determinants of Health   Financial Resource Strain:   . Difficulty of Paying Living Expenses: Not on file  Food Insecurity:   . Worried About Charity fundraiser in the Last Year: Not on file  . Ran  Out of Food in the Last Year: Not on file  Transportation Needs:   . Lack of Transportation (Medical): Not on file  . Lack of Transportation (Non-Medical): Not on file  Physical Activity:   . Days of Exercise per Week: Not on file  . Minutes of Exercise per Session: Not on file  Stress:   . Feeling of Stress : Not on file  Social Connections:   . Frequency of Communication with Friends and Family: Not on file  . Frequency of Social Gatherings with Friends and Family: Not on file  . Attends Religious Services: Not on file  . Active Member of Clubs or Organizations: Not on file  . Attends Archivist Meetings: Not on file  . Marital Status: Not on file  Intimate Partner Violence:   . Fear of Current or Ex-Partner: Not on file  . Emotionally Abused: Not on file  . Physically Abused: Not on file  . Sexually Abused: Not on file   Current Outpatient Medications on File Prior to Visit  Medication Sig Dispense Refill  . albuterol (VENTOLIN HFA) 108 (90 Base) MCG/ACT inhaler Inhale 1-2 puffs into the lungs every 4 (four) hours as needed for wheezing or shortness of breath. 18 g 0  . aspirin EC 81 MG tablet Take 81 mg by mouth daily. Swallow whole.    . Blood Glucose Monitoring Suppl (RELION PREMIER BLU MONITOR) DEVI 1 Device by Does not apply route daily. 1 Device 0  . fluticasone (FLONASE) 50 MCG/ACT nasal spray PLACE 1-2 SPRAYS INTO BOTH NOSTRILS DAILY. 48 mL 2  . glipiZIDE (GLUCOTROL XL) 5 MG 24 hr tablet TAKE 1 TABLET BY MOUTH TWICE A DAY 180 tablet 1  . glucose blood test strip Use as instructed to check sugar 2 times daily 200 each 5  . hydrALAZINE (APRESOLINE) 25 MG tablet Take 1 tablet (25 mg total) by mouth in the morning and at bedtime. 180 tablet 1  . hydrochlorothiazide (HYDRODIURIL) 25 MG tablet TAKE 1 TABLET BY MOUTH EVERY DAY 90 tablet 3  . insulin detemir (LEVEMIR FLEXTOUCH) 100 UNIT/ML FlexPen INJECT 16 UNITS INTO THE SKIN DAILY AT 10 PM.(1 BOX =140 DAYS) 15 mL 3  .  Insulin Pen Needle (NOVOFINE PLUS) 32G X 4 MM  MISC Use 1x a day 100 each 11  . isosorbide mononitrate (IMDUR) 60 MG 24 hr tablet Take 1 tablet (60 mg total) by mouth daily. 90 tablet 3  . losartan (COZAAR) 100 MG tablet TAKE 1 TABLET BY MOUTH EVERY DAY 90 tablet 3  . metFORMIN (GLUCOPHAGE) 500 MG tablet TAKE 2 TABLETS (1,000 MG TOTAL) BY MOUTH 2 (TWO) TIMES DAILY WITH A MEAL. 360 tablet 0  . metoprolol tartrate (LOPRESSOR) 50 MG tablet TAKE 1 TABLET BY MOUTH TWICE A DAY 180 tablet 2  . nitroGLYCERIN (NITROSTAT) 0.4 MG SL tablet PLACE 1 TABLET UNDER THE TONGUE EVERY 5 MIN X 3 DOSES AS NEEDED FOR CHEST PAIN 75 tablet 2  . pravastatin (PRAVACHOL) 40 MG tablet TAKE 1 TABLET ONCE DAILY 90 tablet 3  . Semaglutide, 1 MG/DOSE, 2 MG/1.5ML SOPN Inject into the skin once a week.     . sertraline (ZOLOFT) 25 MG tablet Take 1 tablet (25 mg total) by mouth daily. 90 tablet 1   No current facility-administered medications on file prior to visit.   Allergies  Allergen Reactions  . Statins Other (See Comments)    REACTION: joint pain Lipitor- headaches Has also tried Livalo, pravachol, zetia, crestor, welchol  . Imdur [Isosorbide Dinitrate]     Headache - "violent"   Family History  Problem Relation Age of Onset  . Coronary artery disease Other        CABG  . Coronary artery disease Mother   . Stroke Mother   . Heart disease Mother   . Diabetes Mother   . Other Father   . Diabetes Sister   . Diabetes Maternal Uncle        x2  . Alcohol abuse Other   . Diabetes Paternal Uncle        x2  . Colon cancer Paternal Uncle   . Colon polyps Neg Hx   . Esophageal cancer Neg Hx     PE: BP 130/80   Pulse 68   Ht 5\' 10"  (1.778 m)   Wt 256 lb 9.6 oz (116.4 kg)   SpO2 96%   BMI 36.82 kg/m   Wt Readings from Last 3 Encounters:  01/31/20 256 lb 9.6 oz (116.4 kg)  01/28/20 258 lb (117 kg)  01/03/20 258 lb (117 kg)   Constitutional: overweight, in NAD Eyes: PERRLA, EOMI, no exophthalmos ENT:  moist mucous membranes, no thyromegaly, no cervical lymphadenopathy Cardiovascular: RRR, No MRG Respiratory: CTA B Gastrointestinal: abdomen soft, NT, ND, BS+ Musculoskeletal: no deformities, strength intact in all 4 Skin: moist, warm, no rashes Neurological: no tremor with outstretched hands, DTR normal in all 4  ASSESSMENT: 1. DM2, insulin-dependent, uncontrolled, with complications - CAD, s/p NSTEMI, s/p stent 11/2014 - ED - mild CKD  2. Obesity class II BMI Classification:  < 18.5 underweight   18.5-24.9 normal weight   25.0-29.9 overweight   30.0-34.9 class I obesity   35.0-39.9 class II obesity   ? 40.0 class III obesity   3. HL  PLAN:  1. Patient with uncontrolled type 2 diabetes, on oral antidiabetic regimen with Metformin and sulfonylurea, also on long-acting insulin and weekly GLP-1 receptor agonist.  At last visit, he was having some eructations and GERD so we did not increase the dose of Ozempic from the lowest dose to 0.5 mg weekly.  He continues on 0.25 mg weekly. -At today's visit, sugars are still at goal, but they are higher than before.  We did discuss about increasing the Ozempic  but he would not want to do so as he is afraid that Ozempic may hurt his kidneys.  We did discuss that if anything, Ozempic is helping prevent CKD progression.  However, for now, we can continue on the same low dose.  We will also continue the rest of the regimen.  He has no low blood sugars. - I suggested to:  Patient Instructions  Please continue: - Metformin 1000 mg 2x a day - Glipizide XL 5 mg 2x a day - break/crush the evening dose - Ozempic 0.25 mg weekly - Levemir 20 units at bedtime  Please come back for a follow-up appointment in 4 months.  - we checked his HbA1c: 6.8% (slightly higher, but still at goal) - advised to check sugars at different times of the day - 1x a day, rotating check times - advised for yearly eye exams >> he is UTD - return to clinic in 4  months  2. Obesity class 2 -We started Ozempic, which can also help, however, we are using a low dose -At last visit, he was down 3 pounds despite going indication right before the visit -At this visit, weight is stable  3. HL -Reviewed latest lipid panel from 08/2019: LDL improved, but still above goal, HDL low: Lab Results  Component Value Date   CHOL 154 08/29/2019   HDL 38 (L) 08/29/2019   LDLCALC 101 (H) 08/29/2019   LDLDIRECT 131 (H) 03/22/2019   TRIG 78 08/29/2019   CHOLHDL 4.1 08/29/2019  -Unfortunately, he could not tolerate statins or Repatha (see below)  4.  Statin intolerance -He had side effects from statins: Stiffness and muscle weakness -He was started on Repatha but he could not tolerate it due to muscle weakness  [name], MD PhD [facility] Endocrinology

## 2020-01-31 NOTE — Addendum Note (Signed)
Addended by: Caprice Beaver T on: 01/31/2020 02:14 PM   Modules accepted: Orders

## 2020-02-06 ENCOUNTER — Telehealth: Payer: Self-pay | Admitting: Family Medicine

## 2020-02-06 NOTE — Telephone Encounter (Signed)
Referral followup 

## 2020-02-19 DIAGNOSIS — Z20822 Contact with and (suspected) exposure to covid-19: Secondary | ICD-10-CM | POA: Diagnosis not present

## 2020-02-20 ENCOUNTER — Other Ambulatory Visit (HOSPITAL_COMMUNITY): Payer: Self-pay | Admitting: Internal Medicine

## 2020-02-20 ENCOUNTER — Ambulatory Visit: Payer: Federal, State, Local not specified - PPO | Attending: Internal Medicine

## 2020-02-20 DIAGNOSIS — Z23 Encounter for immunization: Secondary | ICD-10-CM

## 2020-02-20 NOTE — Progress Notes (Signed)
   Covid-19 Vaccination Clinic  Name:  [name]    MRN: [id number] DOB: 1950/07/14  02/20/2020  [name] was observed post Covid-19 immunization for 15 minutes without incident. He was provided with Vaccine Information Sheet and instruction to access the V-Safe system.   [name] was instructed to call 911 with any severe reactions post vaccine: Marland Kitchen Difficulty breathing  . Swelling of face and throat  . A fast heartbeat  . A bad rash all over body  . Dizziness and weakness   Immunizations Administered    No immunizations on file.

## 2020-03-04 ENCOUNTER — Ambulatory Visit: Payer: Federal, State, Local not specified - PPO | Admitting: Interventional Cardiology

## 2020-03-10 ENCOUNTER — Telehealth: Payer: Self-pay | Admitting: Diagnostic Neuroimaging

## 2020-03-10 ENCOUNTER — Encounter: Payer: Self-pay | Admitting: Diagnostic Neuroimaging

## 2020-03-10 ENCOUNTER — Ambulatory Visit (INDEPENDENT_AMBULATORY_CARE_PROVIDER_SITE_OTHER): Payer: Federal, State, Local not specified - PPO | Admitting: Diagnostic Neuroimaging

## 2020-03-10 VITALS — BP 167/82 | HR 60 | Ht 70.0 in | Wt 250.6 lb

## 2020-03-10 DIAGNOSIS — R413 Other amnesia: Secondary | ICD-10-CM | POA: Diagnosis not present

## 2020-03-10 NOTE — Telephone Encounter (Signed)
Called patient and advised his insurance would most likely not pay for lab due to fact Dr Leta Baptist is neurologist. I suggested he call PCP and ask to just have lab done, not wait until his annual FU. He agreed, verbalized understanding, appreciation.

## 2020-03-10 NOTE — Progress Notes (Signed)
[facility]  PATIENT: [name] DOB: March 16, 1951  REFERRING CLINICIAN: Wendie Agreste, MD HISTORY FROM: patient  REASON FOR VISIT: new consult    HISTORICAL  CHIEF COMPLAINT:  Chief Complaint  Patient presents with  . Transient confusion, memory difficulties    rm 7 New Pt MMSE 24    HISTORY OF PRESENT ILLNESS:   69 year old male with hypertension, diabetes, obstructive sleep apnea, depression, here for evaluation of memory loss.  For past 1 to 2 years patient has had some short-term memory loss and confusion, missing appointments, losing things, having difficulty with recent conversations.  He also had an episode where he was driving and went into a "dreamlike state and did not recognize where he was going.  No major changes in ADLs.  Wife is noted some of these issues.  Patient's mother had dementia and he is concerned about similar diagnosis.    REVIEW OF SYSTEMS: Full 14 system review of systems performed and negative with exception of: As per HPI.  ALLERGIES: Allergies  Allergen Reactions  . Statins Other (See Comments)    REACTION: joint pain Lipitor- headaches Has also tried Livalo, pravachol, zetia, crestor, welchol  . Imdur [Isosorbide Dinitrate]     Headache - "violent"    HOME MEDICATIONS: Outpatient Medications Prior to Visit  Medication Sig Dispense Refill  . albuterol (VENTOLIN HFA) 108 (90 Base) MCG/ACT inhaler Inhale 1-2 puffs into the lungs every 4 (four) hours as needed for wheezing or shortness of breath. 18 g 0  . aspirin EC 81 MG tablet Take 81 mg by mouth daily. Swallow whole.    . Blood Glucose Monitoring Suppl (RELION PREMIER BLU MONITOR) DEVI 1 Device by Does not apply route daily. 1 Device 0  . fluticasone (FLONASE) 50 MCG/ACT nasal spray PLACE 1-2 SPRAYS INTO BOTH NOSTRILS DAILY. 48 mL 2  . glipiZIDE (GLUCOTROL XL) 5 MG 24 hr tablet TAKE 1 TABLET BY MOUTH TWICE A DAY 180 tablet 1  . glucose blood test strip Use as  instructed to check sugar 2 times daily 200 each 5  . hydrALAZINE (APRESOLINE) 25 MG tablet Take 1 tablet (25 mg total) by mouth in the morning and at bedtime. 180 tablet 1  . hydrochlorothiazide (HYDRODIURIL) 25 MG tablet TAKE 1 TABLET BY MOUTH EVERY DAY 90 tablet 3  . insulin detemir (LEVEMIR FLEXTOUCH) 100 UNIT/ML FlexPen INJECT 16 UNITS INTO THE SKIN DAILY AT 10 PM.(1 BOX =140 DAYS) 15 mL 3  . Insulin Pen Needle (NOVOFINE PLUS) 32G X 4 MM MISC Use 1x a day 100 each 11  . losartan (COZAAR) 100 MG tablet TAKE 1 TABLET BY MOUTH EVERY DAY 90 tablet 3  . metFORMIN (GLUCOPHAGE) 500 MG tablet TAKE 2 TABLETS (1,000 MG TOTAL) BY MOUTH 2 (TWO) TIMES DAILY WITH A MEAL. 360 tablet 0  . metoprolol tartrate (LOPRESSOR) 50 MG tablet TAKE 1 TABLET BY MOUTH TWICE A DAY 180 tablet 2  . nitroGLYCERIN (NITROSTAT) 0.4 MG SL tablet PLACE 1 TABLET UNDER THE TONGUE EVERY 5 MIN X 3 DOSES AS NEEDED FOR CHEST PAIN 75 tablet 2  . pravastatin (PRAVACHOL) 40 MG tablet TAKE 1 TABLET ONCE DAILY 90 tablet 3  . Semaglutide, 1 MG/DOSE, 2 MG/1.5ML SOPN Inject into the skin once a week.     . sertraline (ZOLOFT) 25 MG tablet Take 1 tablet (25 mg total) by mouth daily. 90 tablet 1  . isosorbide mononitrate (IMDUR) 60 MG 24 hr tablet Take 1 tablet (60 mg  total) by mouth daily. (Patient not taking: Reported on 03/10/2020) 90 tablet 3   No facility-administered medications prior to visit.    PAST MEDICAL HISTORY: Past Medical History:  Diagnosis Date  . Adenomatous colon polyp 2011  . Anemia   . Angina pectoris ([place]) 05/01/2015   med rx for 95% OM (u/a to access due to tortuosity), 95% D1 and other moderate CAD at cath  . Arthritis    "back, knees" (05/01/2015)  . Chest pain    pleuritic  . Chronic lower back pain   . Complex tear of medial meniscus of left knee 09/14/2018  . Coronary artery disease   . Depression   . GERD (gastroesophageal reflux disease)   . HTN (hypertension)   . Hyperlipidemia   . Obstructive sleep  apnea    noncompliant with CPAP  . Osteoarthritis   . Pneumonia ~ 2014 X 1  . Prostate cancer ([place])   . Refusal of blood transfusions as patient is Jehovah's Witness   . Rotator cuff arthropathy, right   . Rupture of right supraspinatus tendon 01/18/2019  . Type II or unspecified type diabetes mellitus without mention of complication, not stated as uncontrolled     PAST SURGICAL HISTORY: Past Surgical History:  Procedure Laterality Date  . CARDIAC CATHETERIZATION  2004   patent coronary arteries  . CARDIAC CATHETERIZATION  05/01/2015   "tried to put stent in but couldn't"  . CARDIAC CATHETERIZATION N/A 05/01/2015   Procedure: Left Heart Cath and Coronary Angiography;  Surgeon: Belva Crome, MD; LAD 60%, oD1 90%, pD1 70%, D2 70%, CFX patent stent, 30% distal to prev stent, pRCA 20%, OM1 90/95%, NL LV  . CARDIAC CATHETERIZATION N/A 05/01/2015   Procedure: Coronary Stent Intervention;  Surgeon: Belva Crome, MD;  Unsuccessful PCI OM due to tortuosity  . CARDIAC CATHETERIZATION N/A 11/06/2014   Procedure: Left Heart Cath and Coronary Angiography;  Surgeon: Belva Crome, MD;  Location: [facility];  Service: Cardiovascular;  Laterality: N/A;  . CLOSED REDUCTION SHOULDER DISLOCATION Right ~ 1975   "& reattached muscle"  . COLONOSCOPY W/ BIOPSIES AND POLYPECTOMY  X 2  . ESOPHAGOGASTRODUODENOSCOPY ENDOSCOPY    . KNEE ARTHROSCOPY WITH MEDIAL MENISECTOMY Left 09/14/2018   Procedure: LEFT KNEE ARTHROSCOPY CHONDROPLASY,  WITH MEDIAL MENISECTOMY;  Surgeon: Marchia Bond, MD;  Location: [facility];  Service: Orthopedics;  Laterality: Left;  . PILONIDAL CYST EXCISION    . SHOULDER ARTHROSCOPY WITH DEBRIDEMENT AND BICEP TENDON REPAIR Right 01/18/2019   Procedure: RIGHT SHOULDER ARTHROSCOPY WITH DEBRIDEMENT AND DECOMPRSSION SUBACROMIAL PARTIAL ACROMIOPLASTY;  Surgeon: Marchia Bond, MD;  Location: [facility];  Service: Orthopedics;  Laterality: Right;  . SHOULDER  ARTHROSCOPY WITH ROTATOR CUFF REPAIR Right 01/18/2019   Procedure: SHOULDER ARTHROSCOPY WITH ROTATOR CUFF REPAIR;  Surgeon: Marchia Bond, MD;  Location: [place];  Service: Orthopedics;  Laterality: Right;    FAMILY HISTORY: Family History  Problem Relation Age of Onset  . Coronary artery disease Other        CABG  . Coronary artery disease Mother   . Stroke Mother   . Heart disease Mother   . Diabetes Mother   . Other Father   . Diabetes Sister   . Diabetes Maternal Uncle        x2  . Alcohol abuse Other   . Diabetes Paternal Uncle        x2  . Colon cancer Paternal Uncle   . Colon  polyps Neg Hx   . Esophageal cancer Neg Hx     SOCIAL HISTORY: Social History   Socioeconomic History  . Marital status: Married    Spouse name: [name]  . Number of children: 4  . Years of education: Not on file  . Highest education level: High school graduate  Occupational History  . Occupation: Retired    Fish farm manager: UNEMPLOYED  Tobacco Use  . Smoking status: Former Smoker    Packs/day: 0.50    Years: 10.00    Pack years: 5.00    Types: Cigarettes    Quit date: 04/06/1983    Years since quitting: 36.9  . Smokeless tobacco: Never Used  Vaping Use  . Vaping Use: Never used  Substance and Sexual Activity  . Alcohol use: Yes    Alcohol/week: 0.0 standard drinks    Comment: `/26/2017 "might drink a beer q couple months mostly; summertime I might drink 2-3 beers/week"  . Drug use: No  . Sexual activity: Not Currently  Other Topics Concern  . Not on file  Social History Narrative   Lives in [place] with spouse Diontae Route).  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   Social Determinants of Health   Financial Resource Strain:   . Difficulty of Paying Living Expenses: Not on file  Food Insecurity:   . Worried About Charity fundraiser in the Last Year: Not on file  . Ran Out of Food in the Last Year: Not on  file  Transportation Needs:   . Lack of Transportation (Medical): Not on file  . Lack of Transportation (Non-Medical): Not on file  Physical Activity:   . Days of Exercise per Week: Not on file  . Minutes of Exercise per Session: Not on file  Stress:   . Feeling of Stress : Not on file  Social Connections:   . Frequency of Communication with Friends and Family: Not on file  . Frequency of Social Gatherings with Friends and Family: Not on file  . Attends Religious Services: Not on file  . Active Member of Clubs or Organizations: Not on file  . Attends Archivist Meetings: Not on file  . Marital Status: Not on file  Intimate Partner Violence:   . Fear of Current or Ex-Partner: Not on file  . Emotionally Abused: Not on file  . Physically Abused: Not on file  . Sexually Abused: Not on file     PHYSICAL EXAM  GENERAL EXAM/CONSTITUTIONAL: Vitals:  Vitals:   03/10/20 1103 03/10/20 1113  BP: (!) 198/95 (!) 167/82  Pulse: 61 60  Weight: 250 lb 9.6 oz (113.7 kg)   Height: 5\' 10"  (1.778 m)      Body mass index is 35.96 kg/m. Wt Readings from Last 3 Encounters:  03/10/20 250 lb 9.6 oz (113.7 kg)  01/31/20 256 lb 9.6 oz (116.4 kg)  01/28/20 258 lb (117 kg)     Patient is in no distress; well developed, nourished and groomed; neck is supple  CARDIOVASCULAR:  Examination of carotid arteries is normal; no carotid bruits  Regular rate and rhythm, no murmurs  Examination of peripheral vascular system by observation and palpation is normal  EYES:  Ophthalmoscopic exam of optic discs and posterior segments is normal; no papilledema or hemorrhages  No exam data present  MUSCULOSKELETAL:  Gait, strength, tone, movements noted in Neurologic exam below  NEUROLOGIC: MENTAL STATUS:  MMSE - [place]  Exam 03/10/2020  Orientation to time 5  Orientation to Place 5  Registration 3  Attention/ Calculation 1  Recall 2  Language- name 2 objects 2  Language-  repeat 0  Language- follow 3 step command 3  Language- read & follow direction 1  Write a sentence 1  Copy design 1  Total score 24    awake, alert, oriented to person, place and time  recent and remote memory intact  normal attention and concentration  language fluent, comprehension intact, naming intact  fund of knowledge appropriate  CRANIAL NERVE:   2nd - no papilledema on fundoscopic exam  2nd, 3rd, 4th, 6th - pupils equal and reactive to light, visual fields full to confrontation, extraocular muscles intact, no nystagmus  5th - facial sensation symmetric  7th - facial strength symmetric  8th - hearing intact  9th - palate elevates symmetrically, uvula midline  11th - shoulder shrug symmetric  12th - tongue protrusion midline  MOTOR:   normal bulk and tone, full strength in the BUE, BLE  SENSORY:   normal and symmetric to light touch, temperature, vibration  COORDINATION:   finger-nose-finger, fine finger movements normal  REFLEXES:   deep tendon reflexes present and symmetric  GAIT/STATION:   narrow based gait     DIAGNOSTIC DATA (LABS, IMAGING, TESTING) - I reviewed patient records, labs, notes, testing and imaging myself where available.  Lab Results  Component Value Date   WBC 6.3 04/09/2019   HGB 14.6 04/09/2019   HCT 44.8 04/09/2019   MCV 87 04/09/2019   PLT 186 04/09/2019      Component Value Date/Time   NA 144 01/03/2020 1007   K 4.2 01/03/2020 1007   CL 105 01/03/2020 1007   CO2 24 01/03/2020 1007   GLUCOSE 122 (H) 01/03/2020 1007   GLUCOSE 252 (H) 01/15/2019 1030   BUN 11 01/03/2020 1007   CREATININE 0.98 01/03/2020 1007   CREATININE 1.04 11/11/2015 1058   CALCIUM 9.1 01/03/2020 1007   PROT 7.0 01/03/2020 1007   ALBUMIN 4.2 01/03/2020 1007   AST 15 01/03/2020 1007   ALT 15 01/03/2020 1007   ALKPHOS 90 01/03/2020 1007   BILITOT 0.3 01/03/2020 1007   GFRNONAA 78 01/03/2020 1007   GFRAA 91 01/03/2020 1007   Lab  Results  Component Value Date   CHOL 154 08/29/2019   HDL 38 (L) 08/29/2019   LDLCALC 101 (H) 08/29/2019   LDLDIRECT 131 (H) 03/22/2019   TRIG 78 08/29/2019   CHOLHDL 4.1 08/29/2019   Lab Results  Component Value Date   HGBA1C 6.8 (A) 01/31/2020   Lab Results  Component Value Date   VITAMINB12 465 10/23/2010   Lab Results  Component Value Date   TSH 1.210 04/09/2019    01/16/10 CT head  - No evidence of intracranial abnormality.     ASSESSMENT AND PLAN  69 y.o. year old male here with:  Dx:  1. Memory loss      PLAN:  INTERMITTENT CONFUSION / SHORT TERM MEMORY LOSS (since ~2019; h/o depression, OSA, HTN, DM) - check MRI brain, B12 level - safety / supervision issues reviewed - daily physical activity / exercise (at least 15-30 minutes) - eat more plants / vegetables - increase social activities, brain stimulation, games, puzzles, hobbies, crafts, arts, music - aim for at least 7-8 hours sleep per night (or more) - avoid smoking and alcohol - caregiver resources provided - caution with driving and finances  Orders Placed This Encounter  Procedures  .  MR BRAIN W WO CONTRAST  . Vitamin B12   Return for pending if symptoms worsen or fail to improve.    Penni Bombard, MD 95/06/9670, 89:79 AM Certified in Neurology, Neurophysiology and Neuroimaging  [facility] [address], [place], [place] [address] [phone]

## 2020-03-10 NOTE — Telephone Encounter (Signed)
Pt states he needs his "PSA" checked and his usual dr is unable to do it for many months, he'd like to know if we can check it with the blood work he had done today. Best call back # is [phone].

## 2020-03-10 NOTE — Telephone Encounter (Signed)
Health team/bcbs fed order sent to GI. No auth they will reach out to the patient to schedule.

## 2020-03-10 NOTE — Patient Instructions (Signed)
CONFUSION / MEMORY LOSS (h/o depression, OSA, HTN, DM) - check MRI brain, labs - safety / supervision issues reviewed - daily physical activity / exercise (at least 15-30 minutes) - eat more plants / vegetables - increase social activities, brain stimulation, games, puzzles, hobbies, crafts, arts, music - aim for at least 7-8 hours sleep per night (or more) - avoid smoking and alcohol - caregiver resources provided - caution with driving and finances

## 2020-03-11 ENCOUNTER — Encounter: Payer: Self-pay | Admitting: *Deleted

## 2020-03-11 LAB — VITAMIN B12: Vitamin B-12: 640 pg/mL (ref 232–1245)

## 2020-03-14 ENCOUNTER — Other Ambulatory Visit: Payer: Self-pay | Admitting: Family Medicine

## 2020-03-14 DIAGNOSIS — R059 Cough, unspecified: Secondary | ICD-10-CM

## 2020-03-14 DIAGNOSIS — I2510 Atherosclerotic heart disease of native coronary artery without angina pectoris: Secondary | ICD-10-CM

## 2020-03-14 DIAGNOSIS — I10 Essential (primary) hypertension: Secondary | ICD-10-CM

## 2020-03-15 NOTE — Telephone Encounter (Signed)
Is this request related to progressive symptoms? If so, please give me an update on complaints. He may need repeat cath.

## 2020-03-18 ENCOUNTER — Other Ambulatory Visit: Payer: Self-pay | Admitting: Internal Medicine

## 2020-03-19 ENCOUNTER — Other Ambulatory Visit: Payer: Self-pay | Admitting: Internal Medicine

## 2020-03-23 ENCOUNTER — Ambulatory Visit
Admission: RE | Admit: 2020-03-23 | Discharge: 2020-03-23 | Disposition: A | Discharge status: Home / Self Care | Payer: Federal, State, Local not specified - PPO | Source: Ambulatory Visit | Attending: Diagnostic Neuroimaging | Admitting: Diagnostic Neuroimaging

## 2020-03-23 ENCOUNTER — Other Ambulatory Visit: Payer: Self-pay

## 2020-03-23 DIAGNOSIS — R413 Other amnesia: Secondary | ICD-10-CM

## 2020-03-23 MED ORDER — GADOBENATE DIMEGLUMINE 529 MG/ML IV SOLN
20.0000 mL | Freq: Once | INTRAVENOUS | Status: AC | PRN
  Administered 2020-03-23: 20 mL via INTRAVENOUS

## 2020-03-25 ENCOUNTER — Telehealth: Payer: Self-pay | Admitting: *Deleted

## 2020-03-25 NOTE — Telephone Encounter (Signed)
-----   Message from Penni Bombard, MD sent at 03/25/2020  2:09 PM EST ----- Unremarkable imaging results. Please call patient. Continue current plan. -VRP

## 2020-04-01 DIAGNOSIS — H6123 Impacted cerumen, bilateral: Secondary | ICD-10-CM | POA: Insufficient documentation

## 2020-04-01 DIAGNOSIS — H9313 Tinnitus, bilateral: Secondary | ICD-10-CM | POA: Diagnosis not present

## 2020-04-02 DIAGNOSIS — Z7984 Long term (current) use of oral hypoglycemic drugs: Secondary | ICD-10-CM | POA: Diagnosis not present

## 2020-04-02 DIAGNOSIS — E119 Type 2 diabetes mellitus without complications: Secondary | ICD-10-CM | POA: Diagnosis not present

## 2020-04-02 DIAGNOSIS — H5203 Hypermetropia, bilateral: Secondary | ICD-10-CM | POA: Diagnosis not present

## 2020-04-02 DIAGNOSIS — H25093 Other age-related incipient cataract, bilateral: Secondary | ICD-10-CM | POA: Diagnosis not present

## 2020-04-02 DIAGNOSIS — H524 Presbyopia: Secondary | ICD-10-CM | POA: Diagnosis not present

## 2020-04-02 LAB — HM DIABETES EYE EXAM

## 2020-04-07 ENCOUNTER — Telehealth (HOSPITAL_COMMUNITY): Payer: Self-pay | Admitting: *Deleted

## 2020-04-07 ENCOUNTER — Other Ambulatory Visit: Payer: Self-pay | Admitting: Internal Medicine

## 2020-04-07 NOTE — Telephone Encounter (Signed)
Patient given detailed instructions per Myocardial Perfusion Study Information Sheet for the test on 04/09/20  Patient notified to arrive 15 minutes early and that it is imperative to arrive on time for appointment to keep from having the test rescheduled. ° If you need to cancel or reschedule your appointment, please call the office within 24 hours of your appointment. . Patient verbalized understanding. [name] ° ° °

## 2020-04-08 DIAGNOSIS — M542 Cervicalgia: Secondary | ICD-10-CM | POA: Diagnosis not present

## 2020-04-08 DIAGNOSIS — M25511 Pain in right shoulder: Secondary | ICD-10-CM | POA: Diagnosis not present

## 2020-04-08 DIAGNOSIS — M25572 Pain in left ankle and joints of left foot: Secondary | ICD-10-CM | POA: Diagnosis not present

## 2020-04-09 ENCOUNTER — Other Ambulatory Visit: Payer: Self-pay

## 2020-04-09 ENCOUNTER — Ambulatory Visit (HOSPITAL_COMMUNITY): Payer: Federal, State, Local not specified - PPO | Attending: Cardiovascular Disease

## 2020-04-09 DIAGNOSIS — I2510 Atherosclerotic heart disease of native coronary artery without angina pectoris: Secondary | ICD-10-CM | POA: Insufficient documentation

## 2020-04-09 DIAGNOSIS — I10 Essential (primary) hypertension: Secondary | ICD-10-CM | POA: Diagnosis not present

## 2020-04-09 LAB — MYOCARDIAL PERFUSION IMAGING
LV dias vol: 186 mL (ref 62–150)
LV sys vol: 92 mL
Peak HR: 84 {beats}/min
Rest HR: 65 {beats}/min
SDS: 1
SRS: 1
SSS: 2
TID: 1.01

## 2020-04-09 MED ORDER — TECHNETIUM TC 99M TETROFOSMIN IV KIT
32.2000 | PACK | Freq: Once | INTRAVENOUS | Status: AC | PRN
  Administered 2020-04-09: 32.2 via INTRAVENOUS
  Filled 2020-04-09: qty 33

## 2020-04-09 MED ORDER — TECHNETIUM TC 99M TETROFOSMIN IV KIT
9.4000 | PACK | Freq: Once | INTRAVENOUS | Status: AC | PRN
  Administered 2020-04-09: 9.4 via INTRAVENOUS
  Filled 2020-04-09: qty 10

## 2020-04-09 MED ORDER — REGADENOSON 0.4 MG/5ML IV SOLN
0.4000 mg | Freq: Once | INTRAVENOUS | Status: AC
Start: 2020-04-09 — End: 2020-04-09
  Administered 2020-04-09: 0.4 mg via INTRAVENOUS

## 2020-04-25 DIAGNOSIS — C61 Malignant neoplasm of prostate: Secondary | ICD-10-CM | POA: Diagnosis not present

## 2020-04-30 ENCOUNTER — Ambulatory Visit (INDEPENDENT_AMBULATORY_CARE_PROVIDER_SITE_OTHER): Payer: Federal, State, Local not specified - PPO | Admitting: Family Medicine

## 2020-04-30 ENCOUNTER — Encounter: Payer: Self-pay | Admitting: Family Medicine

## 2020-04-30 ENCOUNTER — Other Ambulatory Visit: Payer: Self-pay

## 2020-04-30 VITALS — BP 156/90 | HR 98 | Temp 98.3°F | Ht 70.0 in | Wt 253.0 lb

## 2020-04-30 DIAGNOSIS — I10 Essential (primary) hypertension: Secondary | ICD-10-CM | POA: Diagnosis not present

## 2020-04-30 DIAGNOSIS — F32A Depression, unspecified: Secondary | ICD-10-CM

## 2020-04-30 DIAGNOSIS — E785 Hyperlipidemia, unspecified: Secondary | ICD-10-CM

## 2020-04-30 DIAGNOSIS — R059 Cough, unspecified: Secondary | ICD-10-CM | POA: Diagnosis not present

## 2020-04-30 DIAGNOSIS — R0982 Postnasal drip: Secondary | ICD-10-CM

## 2020-04-30 MED ORDER — SERTRALINE HCL 25 MG PO TABS: 25.0000 mg | ORAL_TABLET | Freq: Every day | ORAL | 1 refills | Status: DC

## 2020-04-30 MED ORDER — FLUTICASONE PROPIONATE 50 MCG/ACT NA SUSP: 1.0000 | Freq: Every day | NASAL | 2 refills | Status: DC

## 2020-04-30 MED ORDER — PRAVASTATIN SODIUM 40 MG PO TABS: 40.0000 mg | ORAL_TABLET | Freq: Every day | ORAL | 3 refills | Status: DC

## 2020-04-30 MED ORDER — AMLODIPINE BESYLATE 2.5 MG PO TABS: 2.5000 mg | ORAL_TABLET | Freq: Every day | ORAL | 1 refills | Status: DC

## 2020-04-30 NOTE — Progress Notes (Unsigned)
Subjective:  Patient ID: [name], male    DOB: 11-12-1950  Age: 70 y.o. MRN: [id number]  CC:  Chief Complaint  Patient presents with  . Follow-up    On medication for review/refill and depression. Pt reports improvement with his depression since last OV. Pt reports his medication seems to work well for him with no known side effects. Pt reports no other concerns to discuss this OV.    HPI [name] presents for   Fatigue/depression . Better on higher dose sertraline 25mg  qd. No new side effects.  Son passed away few weeks ago. Getting by ok. Has necessary resources.  Denies needs at this time.  Depression screen [facility] 2/9 04/30/2020 01/28/2020 01/03/2020 12/31/2019 10/22/2019  Decreased Interest 0 1 0 0 0  Down, Depressed, Hopeless 0 0 0 0 0  PHQ - 2 Score 0 1 0 0 0  Altered sleeping 0 0 - - -  Tired, decreased energy 1 0 - - -  Change in appetite 0 0 - - -  Feeling bad or failure about yourself  0 0 - - -  Trouble concentrating 2 1 - - -  Moving slowly or fidgety/restless 0 0 - - -  Suicidal thoughts 0 0 - - -  PHQ-9 Score 3 2 - - -  Difficult doing work/chores - - - - -  Some recent data might be hidden   Followed by endocrine for diabetes..  Lab Results  Component Value Date   HGBA1C 6.8 (A) 01/31/2020   Hyperlipidemia: Pravastatin daily - no new side effects or myalgias.   Lab Results  Component Value Date   CHOL 207 (H) 04/30/2020   HDL 41 04/30/2020   LDLCALC 148 (H) 04/30/2020   LDLDIRECT 131 (H) 03/22/2019   TRIG 101 04/30/2020   CHOLHDL 5.0 04/30/2020   Lab Results  Component Value Date   ALT 16 04/30/2020   AST 14 04/30/2020   ALKPHOS 106 04/30/2020   BILITOT 0.3 04/30/2020   All rhinitis: flonase ns works well.   Hypertension: No missed doses of meds. Losartan 100mg , metoprolol 50mg  bid.  hctz 30m qd, hydralazine 25mg  BID. Did not tolerate IMDUR. Headache. Amlodipine option per cardiology. Goal 130/80.  Home readings: 140-150/80.  BP  Readings from Last 3 Encounters:  04/30/20 (!) 156/90  03/10/20 (!) 167/82  01/31/20 130/80   Lab Results  Component Value Date   CREATININE 0.89 04/30/2020       History Patient Active Problem List   Diagnosis Date Noted  . Statin myopathy 04/04/2019  . Statin intolerance -weakness, stiffness 02/13/2019  . Rupture of right supraspinatus tendon 01/18/2019  . Complex tear of medial meniscus of left knee 09/14/2018  . Malignant neoplasm of prostate ([place]) 01/04/2018  . Refusal of blood transfusions as patient is Jehovah's Witness   . Pneumonia   . Osteoarthritis   . Obstructive sleep apnea   . Hyperlipidemia   . HTN (hypertension)   . GERD (gastroesophageal reflux disease)   . Depression   . Coronary artery disease   . Chronic lower back pain   . Chest pain   . Arthritis   . Anemia   . Benign essential HTN 01/03/2017  . [name] (paroxysmal atrial tachycardia) ([place]) 10/27/2015  . Type 2 diabetes mellitus with circulatory disorder, without long-term current use of insulin ([name]) 06/19/2015  . Angina pectoris ([place])   . Elevated PSA 12/20/2014  . CAD (coronary artery disease)   . Obstructive sleep apnea  09/06/2014  . Personal history of colonic polyps 12/13/2013  . Blood in stool 12/13/2013  . Prostatitis, acute 12/13/2013  . Insomnia 04/19/2013  . Obesity with body mass index of 30.0-39.9 10/18/2012  . Right lumbar radiculitis 09/29/2011  . Adenomatous colon polyp 04/05/2009  . ORGANIC IMPOTENCE 11/11/2008  . Hyperlipidemia LDL goal <70 08/23/2008  . GERD 08/23/2008  . BPH (benign prostatic hypertrophy) with urinary obstruction 08/23/2008  . OSTEOARTHRITIS 08/23/2008   Past Medical History:  Diagnosis Date  . Adenomatous colon polyp 2011  . Anemia   . Angina pectoris ([place]) 05/01/2015   med rx for 95% OM (u/a to access due to tortuosity), 95% D1 and other moderate CAD at cath  . Arthritis    "back, knees" (05/01/2015)  . Chest pain    pleuritic  . Chronic lower back  pain   . Complex tear of medial meniscus of left knee 09/14/2018  . Coronary artery disease   . Depression   . GERD (gastroesophageal reflux disease)   . HTN (hypertension)   . Hyperlipidemia   . Obstructive sleep apnea    noncompliant with CPAP  . Osteoarthritis   . Pneumonia ~ 2014 X 1  . Prostate cancer ([place])   . Refusal of blood transfusions as patient is Jehovah's Witness   . Rotator cuff arthropathy, right   . Rupture of right supraspinatus tendon 01/18/2019  . Type II or unspecified type diabetes mellitus without mention of complication, not stated as uncontrolled    Past Surgical History:  Procedure Laterality Date  . CARDIAC CATHETERIZATION  2004   patent coronary arteries  . CARDIAC CATHETERIZATION  05/01/2015   "tried to put stent in but couldn't"  . CARDIAC CATHETERIZATION N/A 05/01/2015   Procedure: Left Heart Cath and Coronary Angiography;  Surgeon: Belva Crome, MD; LAD 60%, oD1 90%, pD1 70%, D2 70%, CFX patent stent, 30% distal to prev stent, pRCA 20%, OM1 90/95%, NL LV  . CARDIAC CATHETERIZATION N/A 05/01/2015   Procedure: Coronary Stent Intervention;  Surgeon: Belva Crome, MD;  Unsuccessful PCI OM due to tortuosity  . CARDIAC CATHETERIZATION N/A 11/06/2014   Procedure: Left Heart Cath and Coronary Angiography;  Surgeon: Belva Crome, MD;  Location: [place] [facility];  Service: Cardiovascular;  Laterality: N/A;  . CLOSED REDUCTION SHOULDER DISLOCATION Right ~ 1975   "& reattached muscle"  . COLONOSCOPY W/ BIOPSIES AND POLYPECTOMY  X 2  . ESOPHAGOGASTRODUODENOSCOPY ENDOSCOPY    . KNEE ARTHROSCOPY WITH MEDIAL MENISECTOMY Left 09/14/2018   Procedure: LEFT KNEE ARTHROSCOPY CHONDROPLASY,  WITH MEDIAL MENISECTOMY;  Surgeon: Marchia Bond, MD;  Location: [place];  Service: Orthopedics;  Laterality: Left;  . PILONIDAL CYST EXCISION    . SHOULDER ARTHROSCOPY WITH DEBRIDEMENT AND BICEP TENDON REPAIR Right 01/18/2019   Procedure: RIGHT SHOULDER ARTHROSCOPY  WITH DEBRIDEMENT AND DECOMPRSSION SUBACROMIAL PARTIAL ACROMIOPLASTY;  Surgeon: Marchia Bond, MD;  Location: [place];  Service: Orthopedics;  Laterality: Right;  . SHOULDER ARTHROSCOPY WITH ROTATOR CUFF REPAIR Right 01/18/2019   Procedure: SHOULDER ARTHROSCOPY WITH ROTATOR CUFF REPAIR;  Surgeon: Marchia Bond, MD;  Location: [place];  Service: Orthopedics;  Laterality: Right;   Allergies  Allergen Reactions  . Statins Other (See Comments)    REACTION: joint pain Lipitor- headaches Has also tried Livalo, pravachol, zetia, crestor, welchol  . Imdur [Isosorbide Dinitrate]     Headache - "violent"   Prior to Admission medications   Medication Sig Start Date End Date Taking?  Authorizing Provider  albuterol (VENTOLIN HFA) 108 (90 Base) MCG/ACT inhaler INHALE 1-2 PUFFS INTO THE LUNGS EVERY 4 (FOUR) HOURS AS NEEDED FOR WHEEZING OR SHORTNESS OF BREATH. 03/14/20  Yes Wendie Agreste, MD  aspirin EC 81 MG tablet Take 81 mg by mouth daily. Swallow whole.   Yes [provider]  Blood Glucose Monitoring Suppl (RELION PREMIER BLU MONITOR) DEVI 1 Device by Does not apply route daily. 07/12/16  Yes [name], MD  fluticasone (FLONASE) 50 MCG/ACT nasal spray PLACE 1-2 SPRAYS INTO BOTH NOSTRILS DAILY. 09/21/19  Yes Wendie Agreste, MD  glipiZIDE (GLUCOTROL XL) 5 MG 24 hr tablet TAKE 1 TABLET BY MOUTH TWICE A DAY 09/17/19  Yes [name], MD  glucose blood test strip Use as instructed to check sugar 2 times daily 07/12/16  Yes [name], MD  hydrALAZINE (APRESOLINE) 25 MG tablet Take 1 tablet (25 mg total) by mouth in the morning and at bedtime. 08/29/19  Yes Weaver, Scott T, PA-C  hydrochlorothiazide (HYDRODIURIL) 25 MG tablet TAKE 1 TABLET BY MOUTH EVERY DAY 05/25/19  Yes Belva Crome, MD  insulin detemir (LEVEMIR FLEXTOUCH) 100 UNIT/ML FlexPen INJECT 16 UNITS INTO THE SKIN DAILY AT 10 PM.(1 BOX =140 DAYS) 09/28/19  Yes [name], MD   Insulin Pen Needle (NOVOFINE PLUS) 32G X 4 MM MISC Use 1x a day 05/09/14  Yes [name], MD  isosorbide mononitrate (IMDUR) 60 MG 24 hr tablet Take 1 tablet (60 mg total) by mouth daily. 11/22/19  Yes Belva Crome, MD  losartan (COZAAR) 100 MG tablet TAKE 1 TABLET BY MOUTH EVERY DAY 05/25/19  Yes Belva Crome, MD  metFORMIN (GLUCOPHAGE) 500 MG tablet TAKE 2 TABLETS (1,000 MG TOTAL) BY MOUTH 2 (TWO) TIMES DAILY WITH A MEAL. 01/23/20  Yes [name], MD  metoprolol tartrate (LOPRESSOR) 50 MG tablet TAKE 1 TABLET BY MOUTH TWICE A DAY 11/01/19  Yes Belva Crome, MD  nitroGLYCERIN (NITROSTAT) 0.4 MG SL tablet PLACE 1 TABLET UNDER THE TONGUE EVERY 5 MIN X 3 DOSES AS NEEDED FOR CHEST PAIN 08/02/19  Yes Belva Crome, MD  OZEMPIC, 0.25 OR 0.5 MG/DOSE, 2 MG/1.5ML SOPN INJECT 0.5 MG INTO THE SKIN ONCE A WEEK. 04/07/20  Yes [name], MD  pravastatin (PRAVACHOL) 40 MG tablet TAKE 1 TABLET ONCE DAILY 05/30/19  Yes Wendie Agreste, MD  Semaglutide, 1 MG/DOSE, 2 MG/1.5ML SOPN Inject into the skin once a week.    Yes [provider]  sertraline (ZOLOFT) 25 MG tablet Take 1 tablet (25 mg total) by mouth daily. 12/28/19  Yes Wendie Agreste, MD   Social History   Socioeconomic History  . Marital status: Married    Spouse name: [name]  . Number of children: 4  . Years of education: Not on file  . Highest education level: High school graduate  Occupational History  . Occupation: Retired    Fish farm manager: UNEMPLOYED  Tobacco Use  . Smoking status: Former Smoker    Packs/day: 0.50    Years: 10.00    Pack years: 5.00    Types: Cigarettes    Quit date: 04/06/1983    Years since quitting: 37.0  . Smokeless tobacco: Never Used  Vaping Use  . Vaping Use: Never used  Substance and Sexual Activity  . Alcohol use: Yes    Alcohol/week: 0.0 standard drinks    Comment: `/26/2017 "might drink a beer q couple months mostly; summertime I might drink 2-3 beers/week"  . Drug  use: No  .  Sexual activity: Not Currently  Other Topics Concern  . Not on file  Social History Narrative   Lives in [place] with spouse Haiden Garde).  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   Social Determinants of Health   Financial Resource Strain: Not on file  Food Insecurity: Not on file  Transportation Needs: Not on file  Physical Activity: Not on file  Stress: Not on file  Social Connections: Not on file  Intimate Partner Violence: Not on file    Review of Systems  Constitutional: Negative for fatigue and unexpected weight change.  Eyes: Negative for visual disturbance.  Respiratory: Negative for cough, chest tightness and shortness of breath.   Cardiovascular: Negative for chest pain, palpitations and leg swelling.  Gastrointestinal: Negative for abdominal pain and blood in stool.  Neurological: Negative for dizziness, light-headedness and headaches.     Objective:   Vitals:   04/30/20 1319 04/30/20 1324  BP: (!) 170/83 (!) 156/90  Pulse: 98   Temp: 98.3 F (36.8 C)   TempSrc: Temporal   SpO2: 98%   Weight: 253 lb (114.8 kg)   Height: 5\' 10"  (1.778 m)      Physical Exam Vitals reviewed.  Constitutional:      Appearance: He is well-developed and well-nourished. He is obese.  HENT:     Head: Normocephalic and atraumatic.  Eyes:     Extraocular Movements: EOM normal.     Pupils: Pupils are equal, round, and reactive to light.  Neck:     Vascular: No carotid bruit or JVD.  Cardiovascular:     Rate and Rhythm: Normal rate and regular rhythm.     Heart sounds: Normal heart sounds. No murmur heard.   Pulmonary:     Effort: Pulmonary effort is normal.     Breath sounds: Normal breath sounds. No rales.  Musculoskeletal:        General: No edema.  Skin:    General: Skin is warm and dry.  Neurological:     Mental Status: He is alert and oriented to person, place, and time.  Psychiatric:        Mood and  Affect: Mood and affect normal.        Assessment & Plan:  SHAUN ETHERIDGE is a 70 y.o. male . Essential hypertension - Plan: Comprehensive metabolic panel, amLODipine (NORVASC) 2.5 MG tablet  -Decreased control, add amlodipine low-dose, continue other regimen.  Check labs  Postnasal drip - Plan: fluticasone (FLONASE) 50 MCG/ACT nasal spray Cough - Plan: fluticasone (FLONASE) 50 MCG/ACT nasal spray  Depression, unspecified depression type - Plan: sertraline (ZOLOFT) 25 MG tablet  -Stable symptom control, continue sertraline 25 mg daily  Hyperlipidemia, unspecified hyperlipidemia type - Plan: pravastatin (PRAVACHOL) 40 MG tablet, Lipid panel  -Tolerating current meds, check labs, no changes.  Meds ordered this encounter  Medications  . fluticasone (FLONASE) 50 MCG/ACT nasal spray    Sig: Place 1-2 sprays into both nostrils daily.    Dispense:  48 mL    Refill:  2  . pravastatin (PRAVACHOL) 40 MG tablet    Sig: Take 1 tablet (40 mg total) by mouth daily.    Dispense:  90 tablet    Refill:  3    PATIENT INDICATES USING THIS MEDICINE NOW  . sertraline (ZOLOFT) 25 MG tablet    Sig: Take 1 tablet (25 mg total) by mouth daily.  Dispense:  90 tablet    Refill:  1  . amLODipine (NORVASC) 2.5 MG tablet    Sig: Take 1 tablet (2.5 mg total) by mouth daily.    Dispense:  90 tablet    Refill:  1   Patient Instructions    Glad you are the sertraline is working at current dose.  Continue same.  I will check some cholesterol and other labs today but continue same dose pravastatin.  Blood pressure is running too high, add amlodipine 1 pill/day, continue other medications and follow-up in 6 weeks.  Sooner if any new side effects or new symptoms.  Thank you for coming in today   If you have lab work done today you will be contacted with your lab results within the next 2 weeks.  If you have not heard from [place] then please contact us. The fastest way to get your results is to register for My  Chart.   IF you received an x-ray today, you will receive an invoice from [facility]. Please contact [facility] at [phone] with questions or concerns regarding your invoice.   IF you received labwork today, you will receive an invoice from [facility]. Please contact [facility] at [phone] with questions or concerns regarding your invoice.   Our billing staff will not be able to assist you with questions regarding bills from these companies.  You will be contacted with the lab results as soon as they are available. The fastest way to get your results is to activate your My Chart account. Instructions are located on the last page of this paperwork. If you have not heard from [place] regarding the results in 2 weeks, please contact this office.         Signed, [name], MD [facility]

## 2020-04-30 NOTE — Patient Instructions (Addendum)
  Glad you are the sertraline is working at current dose.  Continue same.  I will check some cholesterol and other labs today but continue same dose pravastatin.  Blood pressure is running too high, add amlodipine 1 pill/day, continue other medications and follow-up in 6 weeks.  Sooner if any new side effects or new symptoms.  Thank you for coming in today   If you have lab work done today you will be contacted with your lab results within the next 2 weeks.  If you have not heard from [place] then please contact us. The fastest way to get your results is to register for My Chart.   IF you received an x-ray today, you will receive an invoice from [facility]. Please contact [facility] at [phone] with questions or concerns regarding your invoice.   IF you received labwork today, you will receive an invoice from [facility]. Please contact [facility] at [phone] with questions or concerns regarding your invoice.   Our billing staff will not be able to assist you with questions regarding bills from these companies.  You will be contacted with the lab results as soon as they are available. The fastest way to get your results is to activate your My Chart account. Instructions are located on the last page of this paperwork. If you have not heard from [place] regarding the results in 2 weeks, please contact this office.

## 2020-05-01 ENCOUNTER — Encounter: Payer: Self-pay | Admitting: Family Medicine

## 2020-05-01 ENCOUNTER — Other Ambulatory Visit: Payer: Self-pay

## 2020-05-01 ENCOUNTER — Other Ambulatory Visit: Payer: Self-pay | Admitting: Internal Medicine

## 2020-05-01 LAB — COMPREHENSIVE METABOLIC PANEL
ALT: 16 IU/L (ref 0–44)
AST: 14 IU/L (ref 0–40)
Albumin/Globulin Ratio: 1.5 (ref 1.2–2.2)
Albumin: 4.4 g/dL (ref 3.8–4.8)
Alkaline Phosphatase: 106 IU/L (ref 44–121)
BUN/Creatinine Ratio: 11 (ref 10–24)
BUN: 10 mg/dL (ref 8–27)
Bilirubin Total: 0.3 mg/dL (ref 0.0–1.2)
CO2: 27 mmol/L (ref 20–29)
Calcium: 9.3 mg/dL (ref 8.6–10.2)
Chloride: 106 mmol/L (ref 96–106)
Creatinine, Ser: 0.89 mg/dL (ref 0.76–1.27)
GFR calc Af Amer: 100 mL/min/{1.73_m2} (ref >59)
GFR calc non Af Amer: 87 mL/min/{1.73_m2} (ref >59)
Globulin, Total: 2.9 g/dL (ref 1.5–4.5)
Glucose: 90 mg/dL (ref 65–99)
Potassium: 4.4 mmol/L (ref 3.5–5.2)
Sodium: 145 mmol/L — ABNORMAL HIGH (ref 134–144)
Total Protein: 7.3 g/dL (ref 6.0–8.5)

## 2020-05-01 LAB — LIPID PANEL
Chol/HDL Ratio: 5 ratio (ref 0.0–5.0)
Cholesterol, Total: 207 mg/dL — ABNORMAL HIGH (ref 100–199)
HDL: 41 mg/dL (ref >39)
LDL Chol Calc (NIH): 148 mg/dL — ABNORMAL HIGH (ref 0–99)
Triglycerides: 101 mg/dL (ref 0–149)
VLDL Cholesterol Cal: 18 mg/dL (ref 5–40)

## 2020-05-01 MED ORDER — HYDRALAZINE HCL 25 MG PO TABS: 25.0000 mg | ORAL_TABLET | Freq: Two times a day (BID) | ORAL | 2 refills | Status: DC

## 2020-05-26 NOTE — Progress Notes (Unsigned)
Cardiology Office Note:    Date:  05/27/2020   ID:  Brock, Ronald [birth year], MRN [id number]  PCP:  Wendie Agreste, MD  Cardiologist:  [name], MD   Referring MD: Wendie Agreste, MD   Chief Complaint  Patient presents with  . Hypertension  . Hyperlipidemia  . Congestive Heart Failure  . Atrial Fibrillation  . Coronary Artery Disease    History of Present Illness:    [name] is a 70 y.o. male with a hx of CAD s/p DES to LCx 11/2014, OM1 not stented due to inability of stent to track (likely due to wire tangle), HTN, HLD, OSA not on CPAP and PAT/PAF (not on anticoagulation due to low burden).   Mr. [name] is here today doing relatively well but disappointed in his blood pressure control. He has gained some weight. He is not exercising. He is not eating a heart healthy diet. He is greater than 3 g of sodium per day. He takes an excessive fluid. He does not always wear his CPAP. He is not having angina. He has not needed sublingual nitroglycerin.  Related to his blood pressure, he is on Norvasc 2.5 mg/day (higher doses caused lower extremity edema), metoprolol 50 mg twice daily, hydralazine 25 mg twice daily, hydrochlorothiazide 25 mg/day, and Cozaar 100 mg/day.  Past Medical History:  Diagnosis Date  . Adenomatous colon polyp 2011  . Anemia   . Angina pectoris ([place]) 05/01/2015   med rx for 95% OM (u/a to access due to tortuosity), 95% D1 and other moderate CAD at cath  . Arthritis    "back, knees" (05/01/2015)  . Chest pain    pleuritic  . Chronic lower back pain   . Complex tear of medial meniscus of left knee 09/14/2018  . Coronary artery disease   . Depression   . GERD (gastroesophageal reflux disease)   . HTN (hypertension)   . Hyperlipidemia   . Obstructive sleep apnea    noncompliant with CPAP  . Osteoarthritis   . Pneumonia ~ 2014 X 1  . Prostate cancer ([place])   . Refusal of blood transfusions as patient is Jehovah's Witness   .  Rotator cuff arthropathy, right   . Rupture of right supraspinatus tendon 01/18/2019  . Type II or unspecified type diabetes mellitus without mention of complication, not stated as uncontrolled     Past Surgical History:  Procedure Laterality Date  . CARDIAC CATHETERIZATION  2004   patent coronary arteries  . CARDIAC CATHETERIZATION  05/01/2015   "tried to put stent in but couldn't"  . CARDIAC CATHETERIZATION N/A 05/01/2015   Procedure: Left Heart Cath and Coronary Angiography;  Surgeon: Belva Crome, MD; LAD 60%, oD1 90%, pD1 70%, D2 70%, CFX patent stent, 30% distal to prev stent, pRCA 20%, OM1 90/95%, NL LV  . CARDIAC CATHETERIZATION N/A 05/01/2015   Procedure: Coronary Stent Intervention;  Surgeon: Belva Crome, MD;  Unsuccessful PCI OM due to tortuosity  . CARDIAC CATHETERIZATION N/A 11/06/2014   Procedure: Left Heart Cath and Coronary Angiography;  Surgeon: Belva Crome, MD;  Location: [facility];  Service: Cardiovascular;  Laterality: N/A;  . CLOSED REDUCTION SHOULDER DISLOCATION Right ~ 1975   "& reattached muscle"  . COLONOSCOPY W/ BIOPSIES AND POLYPECTOMY  X 2  . ESOPHAGOGASTRODUODENOSCOPY ENDOSCOPY    . KNEE ARTHROSCOPY WITH MEDIAL MENISECTOMY Left 09/14/2018   Procedure: LEFT KNEE ARTHROSCOPY CHONDROPLASY,  WITH MEDIAL MENISECTOMY;  Surgeon: [name],  Vonna Kotyk, MD;  Location: [facility];  Service: Orthopedics;  Laterality: Left;  . PILONIDAL CYST EXCISION    . SHOULDER ARTHROSCOPY WITH DEBRIDEMENT AND BICEP TENDON REPAIR Right 01/18/2019   Procedure: RIGHT SHOULDER ARTHROSCOPY WITH DEBRIDEMENT AND DECOMPRSSION SUBACROMIAL PARTIAL ACROMIOPLASTY;  Surgeon: Marchia Bond, MD;  Location: [facility];  Service: Orthopedics;  Laterality: Right;  . SHOULDER ARTHROSCOPY WITH ROTATOR CUFF REPAIR Right 01/18/2019   Procedure: SHOULDER ARTHROSCOPY WITH ROTATOR CUFF REPAIR;  Surgeon: Marchia Bond, MD;  Location: [facility];  Service:  Orthopedics;  Laterality: Right;    Current Medications: Current Meds  Medication Sig  . albuterol (VENTOLIN HFA) 108 (90 Base) MCG/ACT inhaler INHALE 1-2 PUFFS INTO THE LUNGS EVERY 4 (FOUR) HOURS AS NEEDED FOR WHEEZING OR SHORTNESS OF BREATH.  Marland Kitchen amLODipine (NORVASC) 2.5 MG tablet Take 1 tablet (2.5 mg total) by mouth daily.  Marland Kitchen aspirin EC 81 MG tablet Take 81 mg by mouth daily. Swallow whole.  . Blood Glucose Monitoring Suppl (RELION PREMIER BLU MONITOR) DEVI 1 Device by Does not apply route daily.  . fluticasone (FLONASE) 50 MCG/ACT nasal spray Place 1-2 sprays into both nostrils daily.  Marland Kitchen glipiZIDE (GLUCOTROL XL) 5 MG 24 hr tablet TAKE 1 TABLET BY MOUTH TWICE A DAY  . glucose blood test strip Use as instructed to check sugar 2 times daily  . hydrALAZINE (APRESOLINE) 25 MG tablet Take 1 tablet (25 mg total) by mouth in the morning and at bedtime.  . hydrochlorothiazide (HYDRODIURIL) 25 MG tablet TAKE 1 TABLET BY MOUTH EVERY DAY  . insulin detemir (LEVEMIR FLEXTOUCH) 100 UNIT/ML FlexPen INJECT 16 UNITS INTO THE SKIN DAILY AT 10 PM.(1 BOX =140 DAYS)  . Insulin Pen Needle (NOVOFINE PLUS) 32G X 4 MM MISC Use 1x a day  . losartan (COZAAR) 100 MG tablet TAKE 1 TABLET BY MOUTH EVERY DAY  . metFORMIN (GLUCOPHAGE) 500 MG tablet TAKE 2 TABLETS (1,000 MG TOTAL) BY MOUTH 2 (TWO) TIMES DAILY WITH A MEAL.  . metoprolol tartrate (LOPRESSOR) 50 MG tablet TAKE 1 TABLET BY MOUTH TWICE A DAY  . nitroGLYCERIN (NITROSTAT) 0.4 MG SL tablet PLACE 1 TABLET UNDER THE TONGUE EVERY 5 MIN X 3 DOSES AS NEEDED FOR CHEST PAIN  . OZEMPIC, 0.25 OR 0.5 MG/DOSE, 2 MG/1.5ML SOPN INJECT 0.5 MG INTO THE SKIN ONCE A WEEK.  . pravastatin (PRAVACHOL) 40 MG tablet Take 80 mg by mouth daily.  . sertraline (ZOLOFT) 25 MG tablet Take 1 tablet (25 mg total) by mouth daily.  Marland Kitchen spironolactone (ALDACTONE) 25 MG tablet Take 0.5 tablets (12.5 mg total) by mouth daily.     Allergies:   Statins and Imdur [isosorbide dinitrate]   Social  History   Socioeconomic History  . Marital status: Married    Spouse name: [name]  . Number of children: 4  . Years of education: Not on file  . Highest education level: High school graduate  Occupational History  . Occupation: Retired    Fish farm manager: UNEMPLOYED  Tobacco Use  . Smoking status: Former Smoker    Packs/day: 0.50    Years: 10.00    Pack years: 5.00    Types: Cigarettes    Quit date: 04/06/1983    Years since quitting: 37.1  . Smokeless tobacco: Never Used  Vaping Use  . Vaping Use: Never used  Substance and Sexual Activity  . Alcohol use: Yes    Alcohol/week: 0.0 standard drinks    Comment: `/26/2017 "might drink a beer  q couple months mostly; summertime I might drink 2-3 beers/week"  . Drug use: No  . Sexual activity: Not Currently  Other Topics Concern  . Not on file  Social History Narrative   Lives in [place] with spouse Severino Paolo).  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   Social Determinants of Health   Financial Resource Strain: Not on file  Food Insecurity: Not on file  Transportation Needs: Not on file  Physical Activity: Not on file  Stress: Not on file  Social Connections: Not on file     Family History: The patient's family history includes Alcohol abuse in an other family member; Colon cancer in his paternal uncle; Coronary artery disease in his mother and another family member; Diabetes in his maternal uncle, mother, paternal uncle, and sister; Heart disease in his mother; Other in his father; Stroke in his mother. There is no history of Colon polyps or Esophageal cancer.  ROS:   Please see the history of present illness.    Has arthritis in both knees. All other systems reviewed and are negative.  EKGs/Labs/Other Studies Reviewed:    The following studies were reviewed today: No new data  EKG:  EKG not performed.  Recent Labs: 04/30/2020: ALT 16; BUN 10; Creatinine, Ser 0.89;  Potassium 4.4; Sodium 145  Recent Lipid Panel    Component Value Date/Time   CHOL 207 (H) 04/30/2020 1624   TRIG 101 04/30/2020 1624   HDL 41 04/30/2020 1624   CHOLHDL 5.0 04/30/2020 1624   CHOLHDL 4 06/19/2015 1624   VLDL 12.2 06/19/2015 1624   LDLCALC 148 (H) 04/30/2020 1624   LDLDIRECT 131 (H) 03/22/2019 1427   LDLDIRECT 168.0 12/18/2012 1340    Physical Exam:    VS:  BP (!) 164/88   Pulse 66   Ht 5\' 10"  (1.778 m)   Wt 250 lb 9.6 oz (113.7 kg)   SpO2 97%   BMI 35.96 kg/m     Wt Readings from Last 3 Encounters:  05/27/20 250 lb 9.6 oz (113.7 kg)  04/30/20 253 lb (114.8 kg)  04/09/20 250 lb (113.4 kg)     GEN: Moderate to severe obesity.. No acute distress HEENT: Normal NECK: No JVD. LYMPHATICS: No lymphadenopathy CARDIAC: No murmur. RRR S4 gallop, but no edema. VASCULAR:  Normal Pulses. No bruits. RESPIRATORY:  Clear to auscultation without rales, wheezing or rhonchi  ABDOMEN: Soft, non-tender, non-distended, No pulsatile mass, MUSCULOSKELETAL: No deformity  SKIN: Warm and dry NEUROLOGIC:  Alert and oriented x 3 PSYCHIATRIC:  Normal affect   ASSESSMENT:    1. Coronary artery disease involving native coronary artery of native heart without angina pectoris   2. Hypertension, unspecified type   3. Hyperlipidemia LDL goal <70   4. Obstructive sleep apnea   5. Type 2 diabetes mellitus with other circulatory complication, with long-term current use of insulin ([place])   6. PAF (paroxysmal atrial fibrillation) ([place])   7. Educated about COVID-19 virus infection    PLAN:    In order of problems listed above:  Secondary prevention discussed.Overall education and awareness concerning secondary risk prevention was discussed in detail: LDL less than 70, hemoglobin A1c less than 7, blood pressure target less than 130/80 mmHg, >150 minutes of moderate aerobic activity per week, avoidance of smoking, weight control (via diet and exercise), and continued  surveillance/management of/for obstructive sleep apnea. Avoid nonsteroidals, avoid alcohol, compliance with CPAP, weight loss, and 2.4 g  sodium diet. Add spironolactone 12.5 mg/day and will likely need to increase to 25 mg/day. Basic metabolic panel in 7 days. Target is 130/80 mmHg. LDL cholesterol was too high. Target less than 70. Most recent was 148. Rosuvastatin was doubled to 80 mg/day. He will need to be on a more potent statin such as rosuvastatin or atorvastatin. This is being managed by primary care. Weight loss and compliance with CPAP A1c target less than 7. Continue semaglutide and consider adding SGLT2. Continue to monitor for recurrent atrial fibrillation. No current complaints. Vaccinated and practicing social distancing.  Target BP: <130/80 mmHg  Diet and lifestyle measures for BP control were reviewed in detail: Low sodium diet (<2.5 gm daily); alcohol restriction (<3 ounces per day); weight loss (Mediterranean); avoid non-steroidal agents; > 6 hours sleep per day; 150 min moderate exercise per week. Medical regimen will include at least 2 agents. Resistant hypertension if not controlled on 3 agents. Consider further evaluation: Sleep study to r/o OSA; Renal angiogram; Primary hyperaldonism and Pheochromocytoma w/u. After 3 agents, consider MRA (spironolactone)/ Epleronone), hydralazine, beta-blocker, and Minoxidil if not already in use due to patient profile.    Medication Adjustments/Labs and Tests Ordered: Current medicines are reviewed at length with the patient today.  Concerns regarding medicines are outlined above.  Orders Placed This Encounter  Procedures  . Basic metabolic panel   Meds ordered this encounter  Medications  . spironolactone (ALDACTONE) 25 MG tablet    Sig: Take 0.5 tablets (12.5 mg total) by mouth daily.    Dispense:  45 tablet    Refill:  3    Patient Instructions   Medication Instructions:  1) START Spironolactone 12.5mg  once daily  *If  you need a refill on your cardiac medications before your next appointment, please call your pharmacy*   Lab Work: BMET in 7-10 days  If you have labs (blood work) drawn today and your tests are completely normal, you will receive your results only by: Marland Kitchen MyChart Message (if you have MyChart) OR . A paper copy in the mail If you have any lab test that is abnormal or we need to change your treatment, we will call you to review the results.   Testing/Procedures: None   Follow-Up:  Your physician recommends that you schedule a follow-up appointment in: 4 weeks with our Hypertension team.    At [facility], you and your health needs are our priority.  As part of our continuing mission to provide you with exceptional heart care, we have created designated Provider Care Teams.  These Care Teams include your primary Cardiologist (physician) and Advanced Practice Providers (APPs -  Physician Assistants and Nurse Practitioners) who all work together to provide you with the care you need, when you need it.  We recommend signing up for the patient portal called "MyChart".  Sign up information is provided on this After Visit Summary.  MyChart is used to connect with patients for Virtual Visits (Telemedicine).  Patients are able to view lab/test results, encounter notes, upcoming appointments, etc.  Non-urgent messages can be sent to your provider as well.   To learn more about what you can do with MyChart, go to [email or web address].    Your next appointment:   4-6 month(s)  The format for your next appointment:   In Person  Provider:   You may see [name], MD or one of the following Advanced Practice Providers on your designated Care Team:    Kathyrn Drown, NP  Other Instructions  Your physician recommends that you increase your exercise, decrease salt intake, lose weight and use your CPAP machine.   Low-Sodium Eating Plan Sodium, which is an element that makes up  salt, helps you maintain a healthy balance of fluids in your body. Too much sodium can increase your blood pressure and cause fluid and waste to be held in your body. Your health care provider or dietitian may recommend following this plan if you have high blood pressure (hypertension), kidney disease, liver disease, or heart failure. Eating less sodium can help lower your blood pressure, reduce swelling, and protect your heart, liver, and kidneys. What are tips for following this plan? Reading food labels  The Nutrition Facts label lists the amount of sodium in one serving of the food. If you eat more than one serving, you must multiply the listed amount of sodium by the number of servings.  Choose foods with less than 140 mg of sodium per serving.  Avoid foods with 300 mg of sodium or more per serving. Shopping  Look for lower-sodium products, often labeled as "low-sodium" or "no salt added."  Always check the sodium content, even if foods are labeled as "unsalted" or "no salt added."  Buy fresh foods. ? Avoid canned foods and pre-made or frozen meals. ? Avoid canned, cured, or processed meats.  Buy breads that have less than 80 mg of sodium per slice.   Cooking  Eat more home-cooked food and less restaurant, buffet, and fast food.  Avoid adding salt when cooking. Use salt-free seasonings or herbs instead of table salt or sea salt. Check with your health care provider or pharmacist before using salt substitutes.  Cook with plant-based oils, such as canola, sunflower, or olive oil.   Meal planning  When eating at a restaurant, ask that your food be prepared with less salt or no salt, if possible. Avoid dishes labeled as brined, pickled, cured, smoked, or made with soy sauce, miso, or teriyaki sauce.  Avoid foods that contain MSG (monosodium glutamate). MSG is sometimes added to [place] food, bouillon, and some canned foods.  Make meals that can be grilled, baked, poached, roasted,  or steamed. These are generally made with less sodium. General information Most people on this plan should limit their sodium intake to 1,500-2,000 mg (milligrams) of sodium each day. What foods should I eat? Fruits Fresh, frozen, or canned fruit. Fruit juice. Vegetables Fresh or frozen vegetables. "No salt added" canned vegetables. "No salt added" tomato sauce and paste. Low-sodium or reduced-sodium tomato and vegetable juice. Grains Low-sodium cereals, including oats, puffed wheat and rice, and shredded wheat. Low-sodium crackers. Unsalted rice. Unsalted pasta. Low-sodium bread. Whole-grain breads and whole-grain pasta. Meats and other proteins Fresh or frozen (no salt added) meat, poultry, seafood, and fish. Low-sodium canned tuna and salmon. Unsalted nuts. Dried peas, beans, and lentils without added salt. Unsalted canned beans. Eggs. Unsalted nut butters. Dairy Milk. Soy milk. Cheese that is naturally low in sodium, such as ricotta cheese, fresh mozzarella, or Swiss cheese. Low-sodium or reduced-sodium cheese. Cream cheese. Yogurt. Seasonings and condiments Fresh and dried herbs and spices. Salt-free seasonings. Low-sodium mustard and ketchup. Sodium-free salad dressing. Sodium-free light mayonnaise. Fresh or refrigerated horseradish. Lemon juice. Vinegar. Other foods Homemade, reduced-sodium, or low-sodium soups. Unsalted popcorn and pretzels. Low-salt or salt-free chips. The items listed above may not be a complete list of foods and beverages you can eat. Contact a dietitian for more information. What foods should I avoid? Vegetables Sauerkraut,  pickled vegetables, and relishes. Olives. [place] fries. Onion rings. Regular canned vegetables (not low-sodium or reduced-sodium). Regular canned tomato sauce and paste (not low-sodium or reduced-sodium). Regular tomato and vegetable juice (not low-sodium or reduced-sodium). Frozen vegetables in sauces. Grains Instant hot cereals. Bread  stuffing, pancake, and biscuit mixes. Croutons. Seasoned rice or pasta mixes. Noodle soup cups. Boxed or frozen macaroni and cheese. Regular salted crackers. Self-rising flour. Meats and other proteins Meat or fish that is salted, canned, smoked, spiced, or pickled. Precooked or cured meat, such as sausages or meat loaves. Berniece Salines. Ham. Pepperoni. Hot dogs. Corned beef. Chipped beef. Salt pork. Jerky. Pickled herring. Anchovies and sardines. Regular canned tuna. Salted nuts. Dairy Processed cheese and cheese spreads. Hard cheeses. Cheese curds. Blue cheese. Feta cheese. String cheese. Regular cottage cheese. Buttermilk. Canned milk. Fats and oils Salted butter. Regular margarine. Ghee. Bacon fat. Seasonings and condiments Onion salt, garlic salt, seasoned salt, table salt, and sea salt. Canned and packaged gravies. Worcestershire sauce. Tartar sauce. Barbecue sauce. Teriyaki sauce. Soy sauce, including reduced-sodium. Steak sauce. Fish sauce. Oyster sauce. Cocktail sauce. Horseradish that you find on the shelf. Regular ketchup and mustard. Meat flavorings and tenderizers. Bouillon cubes. Hot sauce. Pre-made or packaged marinades. Pre-made or packaged taco seasonings. Relishes. Regular salad dressings. Salsa. Other foods Salted popcorn and pretzels. Corn chips and puffs. Potato and tortilla chips. Canned or dried soups. Pizza. Frozen entrees and pot pies. The items listed above may not be a complete list of foods and beverages you should avoid. Contact a dietitian for more information. Summary  Eating less sodium can help lower your blood pressure, reduce swelling, and protect your heart, liver, and kidneys.  Most people on this plan should limit their sodium intake to 1,500-2,000 mg (milligrams) of sodium each day.  Canned, boxed, and frozen foods are high in sodium. Restaurant foods, fast foods, and pizza are also very high in sodium. You also get sodium by adding salt to food.  Try to cook at  home, eat more fresh fruits and vegetables, and eat less fast food and canned, processed, or prepared foods. This information is not intended to replace advice given to you by your health care provider. Make sure you discuss any questions you have with your health care provider. Document Revised: 04/27/2019 Document Reviewed: 02/21/2019 Elsevier Patient Education  2021 [place].     Signed, [name], MD  05/27/2020 3:46 PM    [place]

## 2020-05-27 ENCOUNTER — Encounter: Payer: Self-pay | Admitting: Interventional Cardiology

## 2020-05-27 ENCOUNTER — Ambulatory Visit (INDEPENDENT_AMBULATORY_CARE_PROVIDER_SITE_OTHER): Payer: Federal, State, Local not specified - PPO | Admitting: Interventional Cardiology

## 2020-05-27 ENCOUNTER — Other Ambulatory Visit: Payer: Self-pay

## 2020-05-27 VITALS — BP 164/88 | HR 66 | Ht 70.0 in | Wt 250.6 lb

## 2020-05-27 DIAGNOSIS — E785 Hyperlipidemia, unspecified: Secondary | ICD-10-CM | POA: Diagnosis not present

## 2020-05-27 DIAGNOSIS — I2510 Atherosclerotic heart disease of native coronary artery without angina pectoris: Secondary | ICD-10-CM

## 2020-05-27 DIAGNOSIS — I480 Paroxysmal atrial fibrillation: Secondary | ICD-10-CM

## 2020-05-27 DIAGNOSIS — Z7189 Other specified counseling: Secondary | ICD-10-CM

## 2020-05-27 DIAGNOSIS — Z794 Long term (current) use of insulin: Secondary | ICD-10-CM

## 2020-05-27 DIAGNOSIS — G4733 Obstructive sleep apnea (adult) (pediatric): Secondary | ICD-10-CM

## 2020-05-27 DIAGNOSIS — E1159 Type 2 diabetes mellitus with other circulatory complications: Secondary | ICD-10-CM

## 2020-05-27 DIAGNOSIS — I10 Essential (primary) hypertension: Secondary | ICD-10-CM

## 2020-05-27 MED ORDER — SPIRONOLACTONE 25 MG PO TABS: 12.5000 mg | ORAL_TABLET | Freq: Every day | ORAL | 3 refills | Status: DC

## 2020-05-27 NOTE — Patient Instructions (Addendum)
Medication Instructions:  1) START Spironolactone 12.5mg  once daily  *If you need a refill on your cardiac medications before your next appointment, please call your pharmacy*   Lab Work: BMET in 7-10 days  If you have labs (blood work) drawn today and your tests are completely normal, you will receive your results only by: Marland Kitchen MyChart Message (if you have MyChart) OR . A paper copy in the mail If you have any lab test that is abnormal or we need to change your treatment, we will call you to review the results.   Testing/Procedures: None   Follow-Up:  Your physician recommends that you schedule a follow-up appointment in: 4 weeks with our Hypertension team.    At [facility], you and your health needs are our priority.  As part of our continuing mission to provide you with exceptional heart care, we have created designated Provider Care Teams.  These Care Teams include your primary Cardiologist (physician) and Advanced Practice Providers (APPs -  Physician Assistants and Nurse Practitioners) who all work together to provide you with the care you need, when you need it.  We recommend signing up for the patient portal called "MyChart".  Sign up information is provided on this After Visit Summary.  MyChart is used to connect with patients for Virtual Visits (Telemedicine).  Patients are able to view lab/test results, encounter notes, upcoming appointments, etc.  Non-urgent messages can be sent to your provider as well.   To learn more about what you can do with MyChart, go to [email or web address].    Your next appointment:   4-6 month(s)  The format for your next appointment:   In Person  Provider:   You may see [name], MD or one of the following Advanced Practice Providers on your designated Care Team:    Kathyrn Drown, NP    Other Instructions  Your physician recommends that you increase your exercise, decrease salt intake, lose weight and use your CPAP  machine.   Low-Sodium Eating Plan Sodium, which is an element that makes up salt, helps you maintain a healthy balance of fluids in your body. Too much sodium can increase your blood pressure and cause fluid and waste to be held in your body. Your health care provider or dietitian may recommend following this plan if you have high blood pressure (hypertension), kidney disease, liver disease, or heart failure. Eating less sodium can help lower your blood pressure, reduce swelling, and protect your heart, liver, and kidneys. What are tips for following this plan? Reading food labels  The Nutrition Facts label lists the amount of sodium in one serving of the food. If you eat more than one serving, you must multiply the listed amount of sodium by the number of servings.  Choose foods with less than 140 mg of sodium per serving.  Avoid foods with 300 mg of sodium or more per serving. Shopping  Look for lower-sodium products, often labeled as "low-sodium" or "no salt added."  Always check the sodium content, even if foods are labeled as "unsalted" or "no salt added."  Buy fresh foods. ? Avoid canned foods and pre-made or frozen meals. ? Avoid canned, cured, or processed meats.  Buy breads that have less than 80 mg of sodium per slice.   Cooking  Eat more home-cooked food and less restaurant, buffet, and fast food.  Avoid adding salt when cooking. Use salt-free seasonings or herbs instead of table salt or sea salt. Check with your health  care provider or pharmacist before using salt substitutes.  Cook with plant-based oils, such as canola, sunflower, or olive oil.   Meal planning  When eating at a restaurant, ask that your food be prepared with less salt or no salt, if possible. Avoid dishes labeled as brined, pickled, cured, smoked, or made with soy sauce, miso, or teriyaki sauce.  Avoid foods that contain MSG (monosodium glutamate). MSG is sometimes added to [place] food, bouillon, and  some canned foods.  Make meals that can be grilled, baked, poached, roasted, or steamed. These are generally made with less sodium. General information Most people on this plan should limit their sodium intake to 1,500-2,000 mg (milligrams) of sodium each day. What foods should I eat? Fruits Fresh, frozen, or canned fruit. Fruit juice. Vegetables Fresh or frozen vegetables. "No salt added" canned vegetables. "No salt added" tomato sauce and paste. Low-sodium or reduced-sodium tomato and vegetable juice. Grains Low-sodium cereals, including oats, puffed wheat and rice, and shredded wheat. Low-sodium crackers. Unsalted rice. Unsalted pasta. Low-sodium bread. Whole-grain breads and whole-grain pasta. Meats and other proteins Fresh or frozen (no salt added) meat, poultry, seafood, and fish. Low-sodium canned tuna and salmon. Unsalted nuts. Dried peas, beans, and lentils without added salt. Unsalted canned beans. Eggs. Unsalted nut butters. Dairy Milk. Soy milk. Cheese that is naturally low in sodium, such as ricotta cheese, fresh mozzarella, or Swiss cheese. Low-sodium or reduced-sodium cheese. Cream cheese. Yogurt. Seasonings and condiments Fresh and dried herbs and spices. Salt-free seasonings. Low-sodium mustard and ketchup. Sodium-free salad dressing. Sodium-free light mayonnaise. Fresh or refrigerated horseradish. Lemon juice. Vinegar. Other foods Homemade, reduced-sodium, or low-sodium soups. Unsalted popcorn and pretzels. Low-salt or salt-free chips. The items listed above may not be a complete list of foods and beverages you can eat. Contact a dietitian for more information. What foods should I avoid? Vegetables Sauerkraut, pickled vegetables, and relishes. Olives. [place] fries. Onion rings. Regular canned vegetables (not low-sodium or reduced-sodium). Regular canned tomato sauce and paste (not low-sodium or reduced-sodium). Regular tomato and vegetable juice (not low-sodium or  reduced-sodium). Frozen vegetables in sauces. Grains Instant hot cereals. Bread stuffing, pancake, and biscuit mixes. Croutons. Seasoned rice or pasta mixes. Noodle soup cups. Boxed or frozen macaroni and cheese. Regular salted crackers. Self-rising flour. Meats and other proteins Meat or fish that is salted, canned, smoked, spiced, or pickled. Precooked or cured meat, such as sausages or meat loaves. Berniece Salines. Ham. Pepperoni. Hot dogs. Corned beef. Chipped beef. Salt pork. Jerky. Pickled herring. Anchovies and sardines. Regular canned tuna. Salted nuts. Dairy Processed cheese and cheese spreads. Hard cheeses. Cheese curds. Blue cheese. Feta cheese. String cheese. Regular cottage cheese. Buttermilk. Canned milk. Fats and oils Salted butter. Regular margarine. Ghee. Bacon fat. Seasonings and condiments Onion salt, garlic salt, seasoned salt, table salt, and sea salt. Canned and packaged gravies. Worcestershire sauce. Tartar sauce. Barbecue sauce. Teriyaki sauce. Soy sauce, including reduced-sodium. Steak sauce. Fish sauce. Oyster sauce. Cocktail sauce. Horseradish that you find on the shelf. Regular ketchup and mustard. Meat flavorings and tenderizers. Bouillon cubes. Hot sauce. Pre-made or packaged marinades. Pre-made or packaged taco seasonings. Relishes. Regular salad dressings. Salsa. Other foods Salted popcorn and pretzels. Corn chips and puffs. Potato and tortilla chips. Canned or dried soups. Pizza. Frozen entrees and pot pies. The items listed above may not be a complete list of foods and beverages you should avoid. Contact a dietitian for more information. Summary  Eating less sodium can help lower your blood pressure, reduce swelling,  and protect your heart, liver, and kidneys.  Most people on this plan should limit their sodium intake to 1,500-2,000 mg (milligrams) of sodium each day.  Canned, boxed, and frozen foods are high in sodium. Restaurant foods, fast foods, and pizza are also very  high in sodium. You also get sodium by adding salt to food.  Try to cook at home, eat more fresh fruits and vegetables, and eat less fast food and canned, processed, or prepared foods. This information is not intended to replace advice given to you by your health care provider. Make sure you discuss any questions you have with your health care provider. Document Revised: 04/27/2019 Document Reviewed: 02/21/2019 Elsevier Patient Education  2021 [employer].

## 2020-06-03 ENCOUNTER — Ambulatory Visit: Payer: Federal, State, Local not specified - PPO | Admitting: Internal Medicine

## 2020-06-05 ENCOUNTER — Other Ambulatory Visit: Payer: Medicare Other

## 2020-06-06 ENCOUNTER — Telehealth: Payer: Self-pay | Admitting: Interventional Cardiology

## 2020-06-06 MED ORDER — HYDRALAZINE HCL 50 MG PO TABS: 50.0000 mg | ORAL_TABLET | Freq: Three times a day (TID) | ORAL | 3 refills | Status: DC

## 2020-06-06 NOTE — Telephone Encounter (Signed)
Called patient with Dr. Thompson Caul advisement. Patient verbalized understanding.

## 2020-06-06 NOTE — Telephone Encounter (Signed)
Increase Hydralazine to 50 mg TID. Record BP and report/

## 2020-06-06 NOTE — Telephone Encounter (Signed)
Called patient back about his message. Patient stated he cannot tolerate aldactone 12.5 mg. Patient canceled his HTN clinic appointment and he stated he has not had any success with them in the past. Patient stated he could try another medication. Informed patient that a message would be sent to Dr. Tamala Julian for advisement.

## 2020-06-06 NOTE — Telephone Encounter (Signed)
    Pt c/o medication issue:  1. Name of Medication:   spironolactone (ALDACTONE) 25 MG tablet    2. How are you currently taking this medication (dosage and times per day)? Take 0.5 tablets (12.5 mg total) by mouth daily.  3. Are you having a reaction (difficulty breathing--STAT)?   4. What is your medication issue? Pt decided not to take this medication. He said he took this before and he didn't like the side effects. He cancelled his pharmD appt, he would like to let Dr. Tamala Julian know, he said let him know if Dr. Tamala Julian has other recommendations

## 2020-06-11 ENCOUNTER — Ambulatory Visit: Payer: Medicare Other | Admitting: Family Medicine

## 2020-06-12 DIAGNOSIS — G4733 Obstructive sleep apnea (adult) (pediatric): Secondary | ICD-10-CM | POA: Diagnosis not present

## 2020-06-16 DIAGNOSIS — M47812 Spondylosis without myelopathy or radiculopathy, cervical region: Secondary | ICD-10-CM | POA: Diagnosis not present

## 2020-06-16 DIAGNOSIS — M7551 Bursitis of right shoulder: Secondary | ICD-10-CM | POA: Diagnosis not present

## 2020-06-23 ENCOUNTER — Encounter: Payer: Self-pay | Admitting: Family Medicine

## 2020-06-23 ENCOUNTER — Ambulatory Visit (INDEPENDENT_AMBULATORY_CARE_PROVIDER_SITE_OTHER): Payer: Federal, State, Local not specified - PPO | Admitting: Family Medicine

## 2020-06-23 ENCOUNTER — Other Ambulatory Visit: Payer: Self-pay

## 2020-06-23 VITALS — BP 160/88 | HR 78 | Temp 98.3°F | Ht 70.0 in | Wt 253.0 lb

## 2020-06-23 DIAGNOSIS — I10 Essential (primary) hypertension: Secondary | ICD-10-CM | POA: Diagnosis not present

## 2020-06-23 DIAGNOSIS — G4733 Obstructive sleep apnea (adult) (pediatric): Secondary | ICD-10-CM | POA: Diagnosis not present

## 2020-06-23 NOTE — Patient Instructions (Addendum)
My new office address: [facility] Address: [address] [place], [place], [place] [address] Phone: [phone]  Continued use of CPAP may help blood pressure as well.  Blood pressure still running a little high. I would recommend sending a message to Dr. Tamala Julian about your recent readings to decide on other changes. You can try the 2.5mg  amlodipine to see if that is tolerated (you were on higher doses in the past when you had swelling).   If feet swell on that dose, then stop it.   Return to the clinic or go to the nearest emergency room if any of your symptoms worsen or new symptoms occur.   Managing Your Hypertension Hypertension, also called high blood pressure, is when the force of the blood pressing against the walls of the arteries is too strong. Arteries are blood vessels that carry blood from your heart throughout your body. Hypertension forces the heart to work harder to pump blood and may cause the arteries to become narrow or stiff. Understanding blood pressure readings Your personal target blood pressure may vary depending on your medical conditions, your age, and other factors. A blood pressure reading includes a higher number over a lower number. Ideally, your blood pressure should be below 120/80. You should know that:  The first, or top, number is called the systolic pressure. It is a measure of the pressure in your arteries as your heart beats.  The second, or bottom number, is called the diastolic pressure. It is a measure of the pressure in your arteries as the heart relaxes. Blood pressure is classified into four stages. Based on your blood pressure reading, your health care provider may use the following stages to determine what type of treatment you need, if any. Systolic pressure and diastolic pressure are measured in a unit called mmHg. Normal  Systolic pressure: below 007.  Diastolic pressure: below 80. Elevated  Systolic pressure: 121-975.  Diastolic  pressure: below 80. Hypertension stage 1  Systolic pressure: 883-254.  Diastolic pressure: 98-26. Hypertension stage 2  Systolic pressure: 415 or above.  Diastolic pressure: 90 or above. How can this condition affect me? Managing your hypertension is an important responsibility. Over time, hypertension can damage the arteries and decrease blood flow to important parts of the body, including the brain, heart, and kidneys. Having untreated or uncontrolled hypertension can lead to:  A heart attack.  A stroke.  A weakened blood vessel (aneurysm).  Heart failure.  Kidney damage.  Eye damage.  Metabolic syndrome.  Memory and concentration problems.  Vascular dementia. What actions can I take to manage this condition? Hypertension can be managed by making lifestyle changes and possibly by taking medicines. Your health care provider will help you make a plan to bring your blood pressure within a normal range. Nutrition  Eat a diet that is high in fiber and potassium, and low in salt (sodium), added sugar, and fat. An example eating plan is called the Dietary Approaches to Stop Hypertension (DASH) diet. To eat this way: ? Eat plenty of fresh fruits and vegetables. Try to fill one-half of your plate at each meal with fruits and vegetables. ? Eat whole grains, such as whole-wheat pasta, brown rice, or whole-grain bread. Fill about one-fourth of your plate with whole grains. ? Eat low-fat dairy products. ? Avoid fatty cuts of meat, processed or cured meats, and poultry with skin. Fill about one-fourth of your plate with lean proteins such as fish, chicken without skin, beans, eggs, and  tofu. ? Avoid pre-made and processed foods. These tend to be higher in sodium, added sugar, and fat.  Reduce your daily sodium intake. Most people with hypertension should eat less than 1,500 mg of sodium a day.   Lifestyle  Work with your health care provider to maintain a healthy body weight or to  lose weight. Ask what an ideal weight is for you.  Get at least 30 minutes of exercise that causes your heart to beat faster (aerobic exercise) most days of the week. Activities may include walking, swimming, or biking.  Include exercise to strengthen your muscles (resistance exercise), such as weight lifting, as part of your weekly exercise routine. Try to do these types of exercises for 30 minutes at least 3 days a week.  Do not use any products that contain nicotine or tobacco, such as cigarettes, e-cigarettes, and chewing tobacco. If you need help quitting, ask your health care provider.  Control any long-term (chronic) conditions you have, such as high cholesterol or diabetes.  Identify your sources of stress and find ways to manage stress. This may include meditation, deep breathing, or making time for fun activities.   Alcohol use  Do not drink alcohol if: ? Your health care provider tells you not to drink. ? You are pregnant, may be pregnant, or are planning to become pregnant.  If you drink alcohol: ? Limit how much you use to:  0-1 drink a day for women.  0-2 drinks a day for men. ? Be aware of how much alcohol is in your drink. In the [place] one drink equals one 12 oz bottle of beer (355 mL), one 5 oz glass of wine (148 mL), or one 1 oz glass of hard liquor (44 mL). Medicines Your health care provider may prescribe medicine if lifestyle changes are not enough to get your blood pressure under control and if:  Your systolic blood pressure is 130 or higher.  Your diastolic blood pressure is 80 or higher. Take medicines only as told by your health care provider. Follow the directions carefully. Blood pressure medicines must be taken as told by your health care provider. The medicine does not work as well when you skip doses. Skipping doses also puts you at risk for problems. Monitoring Before you monitor your blood pressure:  Do not smoke, drink caffeinated beverages, or  exercise within 30 minutes before taking a measurement.  Use the bathroom and empty your bladder (urinate).  Sit quietly for at least 5 minutes before taking measurements. Monitor your blood pressure at home as told by your health care provider. To do this:  Sit with your back straight and supported.  Place your feet flat on the floor. Do not cross your legs.  Support your arm on a flat surface, such as a table. Make sure your upper arm is at heart level.  Each time you measure, take two or three readings one minute apart and record the results. You may also need to have your blood pressure checked regularly by your health care provider.   General information  Talk with your health care provider about your diet, exercise habits, and other lifestyle factors that may be contributing to hypertension.  Review all the medicines you take with your health care provider because there may be side effects or interactions.  Keep all visits as told by your health care provider. Your health care provider can help you create and adjust your plan for managing your high blood pressure. Where to  find more information  National Heart, Lung, and Blood Institute: https://wilson-eaton.com/  American Heart Association: www.heart.org Contact a health care provider if:  You think you are having a reaction to medicines you have taken.  You have repeated (recurrent) headaches.  You feel dizzy.  You have swelling in your ankles.  You have trouble with your vision. Get help right away if:  You develop a severe headache or confusion.  You have unusual weakness or numbness, or you feel faint.  You have severe pain in your chest or abdomen.  You vomit repeatedly.  You have trouble breathing. These symptoms may represent a serious problem that is an emergency. Do not wait to see if the symptoms will go away. Get medical help right away. Call your local emergency services (911 in the [place] Do not drive  yourself to the hospital. Summary  Hypertension is when the force of blood pumping through your arteries is too strong. If this condition is not controlled, it may put you at risk for serious complications.  Your personal target blood pressure may vary depending on your medical conditions, your age, and other factors. For most people, a normal blood pressure is less than 120/80.  Hypertension is managed by lifestyle changes, medicines, or both.  Lifestyle changes to help manage hypertension include losing weight, eating a healthy, low-sodium diet, exercising more, stopping smoking, and limiting alcohol. This information is not intended to replace advice given to you by your health care provider. Make sure you discuss any questions you have with your health care provider. Document Revised: 04/27/2019 Document Reviewed: 02/20/2019 Elsevier Patient Education  2021 [employer].   If you have lab work done today you will be contacted with your lab results within the next 2 weeks.  If you have not heard from [place] then please contact us. The fastest way to get your results is to register for My Chart.   IF you received an x-ray today, you will receive an invoice from [facility]. Please contact [facility] at [phone] with questions or concerns regarding your invoice.   IF you received labwork today, you will receive an invoice from [facility]. Please contact [facility] at [phone] with questions or concerns regarding your invoice.   Our billing staff will not be able to assist you with questions regarding bills from these companies.  You will be contacted with the lab results as soon as they are available. The fastest way to get your results is to activate your My Chart account. Instructions are located on the last page of this paperwork. If you have not heard from [place] regarding the results in 2 weeks, please contact this office.

## 2020-06-23 NOTE — Progress Notes (Signed)
Subjective:  Patient ID: [name], male    DOB: 1950/05/26  Age: 70 y.o. MRN: [id number]  CC:  Chief Complaint  Patient presents with   Follow-up    On hypertension. Pt reports his BP runs high at home. Pt reports it has come down some since last OV, but still runs high. Pt reports that he cant take amlodipine due to edema in his feet when he takes it.     HPI [name] presents for   Hypertension:  Follow-up from January 26.  At that time he was taking losartan 100 mg, metoprolol 50 mg twice daily, HCTZ 25 mg daily, hydralazine 25 mg twice daily.  Did not tolerate Imdur due to headache.  Option of adding amlodipine per cardiology with a goal of blood pressure 130/80 or below.  Home readings were 140-150/80 at that time.  Amlodipine 2.5 mg added. Did not try amlodipine - concerned about feet swelling with that med in the past.   Saw Dr. Tamala Julian on 2/22 - started on spirinolactone 12.5mg , tried twice  - HA both times.  Wearing CPAP - come off at times during the night. New mask past few weeks - working better.  Phone message re: BP meds on 3/4 - increased hydralazine to 50mg  tid. Tolerating this dose.  Home readings past few weeks - 150/88.  Feels better with more recent improved readings.  Cutting back on salt in food.  BP Readings from Last 3 Encounters:  06/23/20 (!) 160/88  05/27/20 (!) 164/88  04/30/20 (!) 156/90   Lab Results  Component Value Date   CREATININE 0.89 04/30/2020     History Patient Active Problem List   Diagnosis Date Noted   Statin myopathy 04/04/2019   Statin intolerance -weakness, stiffness 02/13/2019   Rupture of right supraspinatus tendon 01/18/2019   Complex tear of medial meniscus of left knee 09/14/2018   Malignant neoplasm of prostate ([place]) 01/04/2018   Refusal of blood transfusions as patient is Jehovah's Witness    Pneumonia    Osteoarthritis    Obstructive sleep apnea    Hyperlipidemia    HTN (hypertension)     GERD (gastroesophageal reflux disease)    Depression    Coronary artery disease    Chronic lower back pain    Chest pain    Arthritis    Anemia    Benign essential HTN 01/03/2017   PAT (paroxysmal atrial tachycardia) ([place]) 10/27/2015   Type 2 diabetes mellitus with circulatory disorder, without long-term current use of insulin ([place]) 06/19/2015   Angina pectoris ([place])    Elevated PSA 12/20/2014   CAD (coronary artery disease)    Obstructive sleep apnea 09/06/2014   Personal history of colonic polyps 12/13/2013   Blood in stool 12/13/2013   Prostatitis, acute 12/13/2013   Insomnia 04/19/2013   Obesity with body mass index of 30.0-39.9 10/18/2012   Right lumbar radiculitis 09/29/2011   Adenomatous colon polyp 04/05/2009   ORGANIC IMPOTENCE 11/11/2008   Hyperlipidemia LDL goal <70 08/23/2008   GERD 08/23/2008   BPH (benign prostatic hypertrophy) with urinary obstruction 08/23/2008   OSTEOARTHRITIS 08/23/2008   Past Medical History:  Diagnosis Date   Adenomatous colon polyp 2011   Anemia    Angina pectoris ([place]) 05/01/2015   med rx for 95% OM (u/a to access due to tortuosity), 95% D1 and other moderate CAD at cath   Arthritis    "back, knees" (05/01/2015)   Chest pain    pleuritic  Chronic lower back pain    Complex tear of medial meniscus of left knee 09/14/2018   Coronary artery disease    Depression    GERD (gastroesophageal reflux disease)    HTN (hypertension)    Hyperlipidemia    Obstructive sleep apnea    noncompliant with CPAP   Osteoarthritis    Pneumonia ~ 2014 X 1   Prostate cancer ([facility])    Refusal of blood transfusions as patient is Jehovah's Witness    Rotator cuff arthropathy, right    Rupture of right supraspinatus tendon 01/18/2019   Type II or unspecified type diabetes mellitus without mention of complication, not stated as uncontrolled    Past Surgical History:  Procedure Laterality Date   CARDIAC  CATHETERIZATION  2004   patent coronary arteries   CARDIAC CATHETERIZATION  05/01/2015   "tried to put stent in but couldn't"   CARDIAC CATHETERIZATION N/A 05/01/2015   Procedure: Left Heart Cath and Coronary Angiography;  Surgeon: Belva Crome, MD; LAD 60%, oD1 90%, pD1 70%, D2 70%, CFX patent stent, 30% distal to prev stent, pRCA 20%, OM1 90/95%, NL LV   CARDIAC CATHETERIZATION N/A 05/01/2015   Procedure: Coronary Stent Intervention;  Surgeon: Belva Crome, MD;  Unsuccessful PCI OM due to tortuosity   CARDIAC CATHETERIZATION N/A 11/06/2014   Procedure: Left Heart Cath and Coronary Angiography;  Surgeon: Belva Crome, MD;  Location: [facility];  Service: Cardiovascular;  Laterality: N/A;   CLOSED REDUCTION SHOULDER DISLOCATION Right ~ 1975   "& reattached muscle"   COLONOSCOPY W/ BIOPSIES AND POLYPECTOMY  X 2   ESOPHAGOGASTRODUODENOSCOPY ENDOSCOPY     KNEE ARTHROSCOPY WITH MEDIAL MENISECTOMY Left 09/14/2018   Procedure: LEFT KNEE ARTHROSCOPY CHONDROPLASY,  WITH MEDIAL MENISECTOMY;  Surgeon: Marchia Bond, MD;  Location: [facility];  Service: Orthopedics;  Laterality: Left;   PILONIDAL CYST EXCISION     SHOULDER ARTHROSCOPY WITH DEBRIDEMENT AND BICEP TENDON REPAIR Right 01/18/2019   Procedure: RIGHT SHOULDER ARTHROSCOPY WITH DEBRIDEMENT AND DECOMPRSSION SUBACROMIAL PARTIAL ACROMIOPLASTY;  Surgeon: Marchia Bond, MD;  Location: [facility];  Service: Orthopedics;  Laterality: Right;   SHOULDER ARTHROSCOPY WITH ROTATOR CUFF REPAIR Right 01/18/2019   Procedure: SHOULDER ARTHROSCOPY WITH ROTATOR CUFF REPAIR;  Surgeon: Marchia Bond, MD;  Location: [facility];  Service: Orthopedics;  Laterality: Right;   Allergies  Allergen Reactions   Statins Other (See Comments)    REACTION: joint pain Lipitor- headaches Has also tried Livalo, pravachol, zetia, crestor, welchol   Imdur [Isosorbide Dinitrate]     Headache - "violent"   Prior  to Admission medications   Medication Sig Start Date End Date Taking? Authorizing Provider  albuterol (VENTOLIN HFA) 108 (90 Base) MCG/ACT inhaler INHALE 1-2 PUFFS INTO THE LUNGS EVERY 4 (FOUR) HOURS AS NEEDED FOR WHEEZING OR SHORTNESS OF BREATH. 03/14/20  Yes Wendie Agreste, MD  aspirin EC 81 MG tablet Take 81 mg by mouth daily. Swallow whole.   Yes [provider]  Blood Glucose Monitoring Suppl (RELION PREMIER BLU MONITOR) DEVI 1 Device by Does not apply route daily. 07/12/16  Yes [name], MD  fluticasone (FLONASE) 50 MCG/ACT nasal spray Place 1-2 sprays into both nostrils daily. 04/30/20  Yes Wendie Agreste, MD  glipiZIDE (GLUCOTROL XL) 5 MG 24 hr tablet TAKE 1 TABLET BY MOUTH TWICE A DAY 09/17/19  Yes [name], MD  glucose blood test strip Use as instructed to check sugar 2 times daily 07/12/16  Yes [name], MD  hydrALAZINE (APRESOLINE) 50 MG tablet Take 1 tablet (50 mg total) by mouth 3 (three) times daily. 06/06/20  Yes Belva Crome, MD  hydrochlorothiazide (HYDRODIURIL) 25 MG tablet TAKE 1 TABLET BY MOUTH EVERY DAY 05/25/19  Yes Belva Crome, MD  insulin detemir (LEVEMIR FLEXTOUCH) 100 UNIT/ML FlexPen INJECT 16 UNITS INTO THE SKIN DAILY AT 10 PM.(1 BOX =140 DAYS) 09/28/19  Yes [name], MD  Insulin Pen Needle (NOVOFINE PLUS) 32G X 4 MM MISC Use 1x a day 05/09/14  Yes [name], MD  losartan (COZAAR) 100 MG tablet TAKE 1 TABLET BY MOUTH EVERY DAY 05/25/19  Yes Belva Crome, MD  metFORMIN (GLUCOPHAGE) 500 MG tablet TAKE 2 TABLETS (1,000 MG TOTAL) BY MOUTH 2 (TWO) TIMES DAILY WITH A MEAL. 05/01/20  Yes [name], MD  metoprolol tartrate (LOPRESSOR) 50 MG tablet TAKE 1 TABLET BY MOUTH TWICE A DAY 11/01/19  Yes Belva Crome, MD  nitroGLYCERIN (NITROSTAT) 0.4 MG SL tablet PLACE 1 TABLET UNDER THE TONGUE EVERY 5 MIN X 3 DOSES AS NEEDED FOR CHEST PAIN 08/02/19  Yes Belva Crome, MD  OZEMPIC, 0.25 OR 0.5 MG/DOSE, 2 MG/1.5ML SOPN INJECT 0.5  MG INTO THE SKIN ONCE A WEEK. 04/07/20  Yes [name], MD  pravastatin (PRAVACHOL) 40 MG tablet Take 80 mg by mouth daily.   Yes [provider]  sertraline (ZOLOFT) 25 MG tablet Take 1 tablet (25 mg total) by mouth daily. 04/30/20  Yes Wendie Agreste, MD  amLODipine (NORVASC) 2.5 MG tablet Take 1 tablet (2.5 mg total) by mouth daily. Patient not taking: Reported on 06/23/2020 04/30/20   Wendie Agreste, MD  spironolactone (ALDACTONE) 25 MG tablet Take 0.5 tablets (12.5 mg total) by mouth daily. Patient not taking: Reported on 06/23/2020 05/27/20   Belva Crome, MD   Social History   Socioeconomic History   Marital status: Married    Spouse name: [name]   Number of children: 4   Years of education: Not on file   Highest education level: High school graduate  Occupational History   Occupation: Retired    Fish farm manager: UNEMPLOYED  Tobacco Use   Smoking status: Former Smoker    Packs/day: 0.50    Years: 10.00    Pack years: 5.00    Types: Cigarettes    Quit date: 04/06/1983    Years since quitting: 37.2   Smokeless tobacco: Never Used  Vaping Use   Vaping Use: Never used  Substance and Sexual Activity   Alcohol use: Yes    Alcohol/week: 0.0 standard drinks    Comment: `/26/2017 "might drink a beer q couple months mostly; summertime I might drink 2-3 beers/week"   Drug use: No   Sexual activity: Not Currently  Other Topics Concern   Not on file  Social History Narrative   Lives in [place] with spouse Arlyn Buerkle).  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   Social Determinants of Health   Financial Resource Strain: Not on file  Food Insecurity: Not on file  Transportation Needs: Not on file  Physical Activity: Not on file  Stress: Not on file  Social Connections: Not on file  Intimate Partner Violence: Not on file    Review of Systems  Constitutional: Negative for fatigue and unexpected  weight change.  Eyes: Negative for visual disturbance.  Respiratory: Negative for cough, chest tightness and shortness of breath.  Cardiovascular: Negative for chest pain, palpitations and leg swelling.  Gastrointestinal: Negative for abdominal pain and blood in stool.  Neurological: Negative for dizziness, light-headedness and headaches.     Objective:   Vitals:   06/23/20 1426 06/23/20 1430  BP: (!) 158/86 (!) 160/88  Pulse: 78   Temp: 98.3 F (36.8 C)   TempSrc: Temporal   SpO2: 97%   Weight: 253 lb (114.8 kg)   Height: 5\' 10"  (1.778 m)      Physical Exam Vitals reviewed.  Constitutional:      Appearance: He is well-developed. He is obese.  HENT:     Head: Normocephalic and atraumatic.  Eyes:     Pupils: Pupils are equal, round, and reactive to light.  Neck:     Vascular: No carotid bruit or JVD.  Cardiovascular:     Rate and Rhythm: Normal rate and regular rhythm.     Heart sounds: Normal heart sounds. No murmur heard.   Pulmonary:     Effort: Pulmonary effort is normal.     Breath sounds: Normal breath sounds. No rales.  Musculoskeletal:     Right lower leg: Edema (1+ bilaterrally at just above ankles. ) present.     Left lower leg: Edema present.  Skin:    General: Skin is warm and dry.  Neurological:     Mental Status: He is alert and oriented to person, place, and time.      Assessment & Plan:  [name] is a 70 y.o. male . Essential hypertension  OSA (obstructive sleep apnea)  Encouraged continued use of CPAP as may help with BP control.  Unfortunately did not tolerate spirinolactone. Can try low dose amlodipine as prior dosing 5-10 mg with edema (some baseline pedal edema). Follow up with cardiology if ineffective or not tolerated.   No orders of the defined types were placed in this encounter.  Patient Instructions   My new office address: [facility] Address: [address] [place], [place], [place] [address] Phone: [phone]  Continued use of CPAP may help blood pressure as well.  Blood pressure still running a little high. I would recommend sending a message to Dr. Tamala Julian about your recent readings to decide on other changes. You can try the 2.5mg  amlodipine to see if that is tolerated (you were on higher doses in the past when you had swelling).   If feet swell on that dose, then stop it.   Return to the clinic or go to the nearest emergency room if any of your symptoms worsen or new symptoms occur.   Managing Your Hypertension Hypertension, also called high blood pressure, is when the force of the blood pressing against the walls of the arteries is too strong. Arteries are blood vessels that carry blood from your heart throughout your body. Hypertension forces the heart to work harder to pump blood and may cause the arteries to become narrow or stiff. Understanding blood pressure readings Your personal target blood pressure may vary depending on your medical conditions, your age, and other factors. A blood pressure reading includes a higher number over a lower number. Ideally, your blood pressure should be below 120/80. You should know that:  The first, or top, number is called the systolic pressure. It is a measure of the pressure in your arteries as your heart beats.  The second, or bottom number, is called the diastolic pressure. It is a measure of the pressure in your arteries as the heart  relaxes. Blood pressure is classified into four stages. Based on your blood pressure reading, your health care provider may use the following stages to determine what type of treatment you need, if any. Systolic pressure and diastolic pressure are measured in a unit called mmHg. Normal  Systolic pressure: below 517.  Diastolic pressure: below 80. Elevated  Systolic pressure: 616-073.  Diastolic pressure: below 80. Hypertension stage 1  Systolic pressure: 710-626.  Diastolic pressure: 94-85. Hypertension  stage 2  Systolic pressure: 462 or above.  Diastolic pressure: 90 or above. How can this condition affect me? Managing your hypertension is an important responsibility. Over time, hypertension can damage the arteries and decrease blood flow to important parts of the body, including the brain, heart, and kidneys. Having untreated or uncontrolled hypertension can lead to:  A heart attack.  A stroke.  A weakened blood vessel (aneurysm).  Heart failure.  Kidney damage.  Eye damage.  Metabolic syndrome.  Memory and concentration problems.  Vascular dementia. What actions can I take to manage this condition? Hypertension can be managed by making lifestyle changes and possibly by taking medicines. Your health care provider will help you make a plan to bring your blood pressure within a normal range. Nutrition  Eat a diet that is high in fiber and potassium, and low in salt (sodium), added sugar, and fat. An example eating plan is called the Dietary Approaches to Stop Hypertension (DASH) diet. To eat this way: ? Eat plenty of fresh fruits and vegetables. Try to fill one-half of your plate at each meal with fruits and vegetables. ? Eat whole grains, such as whole-wheat pasta, brown rice, or whole-grain bread. Fill about one-fourth of your plate with whole grains. ? Eat low-fat dairy products. ? Avoid fatty cuts of meat, processed or cured meats, and poultry with skin. Fill about one-fourth of your plate with lean proteins such as fish, chicken without skin, beans, eggs, and tofu. ? Avoid pre-made and processed foods. These tend to be higher in sodium, added sugar, and fat.  Reduce your daily sodium intake. Most people with hypertension should eat less than 1,500 mg of sodium a day.   Lifestyle  Work with your health care provider to maintain a healthy body weight or to lose weight. Ask what an ideal weight is for you.  Get at least 30 minutes of exercise that causes your heart to beat  faster (aerobic exercise) most days of the week. Activities may include walking, swimming, or biking.  Include exercise to strengthen your muscles (resistance exercise), such as weight lifting, as part of your weekly exercise routine. Try to do these types of exercises for 30 minutes at least 3 days a week.  Do not use any products that contain nicotine or tobacco, such as cigarettes, e-cigarettes, and chewing tobacco. If you need help quitting, ask your health care provider.  Control any long-term (chronic) conditions you have, such as high cholesterol or diabetes.  Identify your sources of stress and find ways to manage stress. This may include meditation, deep breathing, or making time for fun activities.   Alcohol use  Do not drink alcohol if: ? Your health care provider tells you not to drink. ? You are pregnant, may be pregnant, or are planning to become pregnant.  If you drink alcohol: ? Limit how much you use to:  0-1 drink a day for women.  0-2 drinks a day for men. ? Be aware of how much alcohol is in your drink. In  the [place] one drink equals one 12 oz bottle of beer (355 mL), one 5 oz glass of wine (148 mL), or one 1 oz glass of hard liquor (44 mL). Medicines Your health care provider may prescribe medicine if lifestyle changes are not enough to get your blood pressure under control and if:  Your systolic blood pressure is 130 or higher.  Your diastolic blood pressure is 80 or higher. Take medicines only as told by your health care provider. Follow the directions carefully. Blood pressure medicines must be taken as told by your health care provider. The medicine does not work as well when you skip doses. Skipping doses also puts you at risk for problems. Monitoring Before you monitor your blood pressure:  Do not smoke, drink caffeinated beverages, or exercise within 30 minutes before taking a measurement.  Use the bathroom and empty your bladder (urinate).  Sit quietly  for at least 5 minutes before taking measurements. Monitor your blood pressure at home as told by your health care provider. To do this:  Sit with your back straight and supported.  Place your feet flat on the floor. Do not cross your legs.  Support your arm on a flat surface, such as a table. Make sure your upper arm is at heart level.  Each time you measure, take two or three readings one minute apart and record the results. You may also need to have your blood pressure checked regularly by your health care provider.   General information  Talk with your health care provider about your diet, exercise habits, and other lifestyle factors that may be contributing to hypertension.  Review all the medicines you take with your health care provider because there may be side effects or interactions.  Keep all visits as told by your health care provider. Your health care provider can help you create and adjust your plan for managing your high blood pressure. Where to find more information  National Heart, Lung, and Blood Institute: https://wilson-eaton.com/  American Heart Association: www.heart.org Contact a health care provider if:  You think you are having a reaction to medicines you have taken.  You have repeated (recurrent) headaches.  You feel dizzy.  You have swelling in your ankles.  You have trouble with your vision. Get help right away if:  You develop a severe headache or confusion.  You have unusual weakness or numbness, or you feel faint.  You have severe pain in your chest or abdomen.  You vomit repeatedly.  You have trouble breathing. These symptoms may represent a serious problem that is an emergency. Do not wait to see if the symptoms will go away. Get medical help right away. Call your local emergency services (911 in the [place] Do not drive yourself to the hospital. Summary  Hypertension is when the force of blood pumping through your arteries is too strong. If  this condition is not controlled, it may put you at risk for serious complications.  Your personal target blood pressure may vary depending on your medical conditions, your age, and other factors. For most people, a normal blood pressure is less than 120/80.  Hypertension is managed by lifestyle changes, medicines, or both.  Lifestyle changes to help manage hypertension include losing weight, eating a healthy, low-sodium diet, exercising more, stopping smoking, and limiting alcohol. This information is not intended to replace advice given to you by your health care provider. Make sure you discuss any questions you have with your health care provider. Document Revised:  04/27/2019 Document Reviewed: 02/20/2019 Elsevier Patient Education  2021 [employer].   If you have lab work done today you will be contacted with your lab results within the next 2 weeks.  If you have not heard from [place] then please contact us. The fastest way to get your results is to register for My Chart.   IF you received an x-ray today, you will receive an invoice from [facility]. Please contact [facility] at [phone] with questions or concerns regarding your invoice.   IF you received labwork today, you will receive an invoice from [facility]. Please contact [facility] at [phone] with questions or concerns regarding your invoice.   Our billing staff will not be able to assist you with questions regarding bills from these companies.  You will be contacted with the lab results as soon as they are available. The fastest way to get your results is to activate your My Chart account. Instructions are located on the last page of this paperwork. If you have not heard from [place] regarding the results in 2 weeks, please contact this office.         Signed, [name], MD [facility]

## 2020-06-24 ENCOUNTER — Encounter: Payer: Self-pay | Admitting: Family Medicine

## 2020-06-26 ENCOUNTER — Ambulatory Visit: Payer: Medicare Other

## 2020-07-11 ENCOUNTER — Encounter: Payer: Self-pay | Admitting: Internal Medicine

## 2020-07-14 ENCOUNTER — Ambulatory Visit: Payer: Federal, State, Local not specified - PPO | Admitting: Internal Medicine

## 2020-07-14 NOTE — Progress Notes (Deleted)
Patient ID: [name], male   DOB: 09/13/1950, 70 y.o.   MRN: [id number]  This visit occurred during the SARS-CoV-2 public health emergency.  Safety protocols were in place, including screening questions prior to the visit, additional usage of staff PPE, and extensive cleaning of exam room while observing appropriate contact time as indicated for disinfecting solutions.   HPI: [name] is a 70 y.o.-year-old male, returning for f/u for DM2, dx 2007, insulin-dependent since 2014, uncontrolled, with complications (CAD - s/p NSTEMI 11/2014 - stent; ED; mild CKD). Last visit 4 months ago. PCP: Dr Odette Fraction.  Interim history: Before last visit, he just started on Zoloft and his fatigue and depression improved significantly.  He feels that this is not helping. However, he has memory loss for which he sees neurology.  Reviewed HbA1c levels: Lab Results  Component Value Date   HGBA1C 6.8 (A) 01/31/2020   HGBA1C 6.7 (A) 09/27/2019   HGBA1C 7.9 (A) 08/13/2019   Pt is on a regimen of: - Metformin 1000 mg 2x a day - Glipizide XL 5 mg 2x a day -crushes the evening dose - Levemir 20 units at bedtime - Ozempic 0.5 mg weekly >> recommended 10/2018 >> finally started 02/2019 - took 4 doses and stopped for fear of SEs >> restarted 08/2019 at 0.25 mg weekly  He tried Toujeo 20 units at night but did not like it He did not start Januvia as suggested at last visit ("I don't think I need it"). He was Levemir 140 units in am >> decreased to 70 units>> stopped as this was too expensive for him He stopped Welchol 3.7g at bedtime a day. Stopped Farxiga 10 mg b/c frequent urination and urgency. He was on Glipizide in the past. He was on Metformin in the past >> no N/V.   Pt checks his sugars once a day: - am: 150-180 >> 150 >> 130-140 >> 98-100 >> 120-130 - 2h after b'fast: n/c >> 128, 134 >> n/c - before lunch: n/c >> 140s >> n/c >> 100-120 >> n/c - 2h after lunch: n/c >> 140-157 >> n/c  - before  dinner:  106-120 >> n/c >> 80-130 >> n/c - 2h after dinner: 200-245 >> 150-180, 200 >> n/c - bedtime:   180-200 >> 230-240 >> 137-140 >> 150 - nighttime:94, 190-305 >> 148-216 >> n/c  Lowest sugar was 130 >> 23!!! when took Ozempic every day by mistake >> 101; he has hypoglycemia awareness in the 80s Highest sugar was 240 >> 180 >> 200 x1.  -+ Mild CKD, last BUN/creatinine:  Lab Results  Component Value Date   BUN 10 04/30/2020   CREATININE 0.89 04/30/2020  09/07/2016: 13/0.9, glucose 133 On losartan. -+ HL; last set of lipids: Lab Results  Component Value Date   CHOL 207 (H) 04/30/2020   HDL 41 04/30/2020   LDLCALC 148 (H) 04/30/2020   LDLDIRECT 131 (H) 03/22/2019   TRIG 101 04/30/2020   CHOLHDL 5.0 04/30/2020  09/07/2016: 167/65/33/121 He was on pravastatin but could not tolerate it due to muscle aches and weakness.  He also stopped Repatha due to muscle weakness. - last eye exam was in 04/02/2020: No DR; Dr. Lady Gary. - He denies  numbness and tingling in his feet.  Pt was admitted for CP in 11/2014: NSTEMI >> CAD >> PTCA >> stent placed.  He was in ED with CP 05/2017 >> no cardiac pathology. He had shoulder surgery 01/18/2019 after rupture of his supraspinatus tendon. In 2020, he  had radiotherapy for his prostate cancer (with gold beads).    ROS: Constitutional: No weight gain/no weight loss, no fatigue, no subjective hyperthermia, no subjective hypothermia Eyes: no blurry vision, no xerophthalmia ENT: no sore throat, no nodules palpated in neck, no dysphagia, no odynophagia, no hoarseness Cardiovascular: no CP/no SOB/no palpitations/no leg swelling Respiratory: no cough/no SOB/no wheezing Gastrointestinal: no N/no V/no D/no C/+ acid reflux  Musculoskeletal: no muscle aches/no joint aches Skin: no rashes, no hair loss Neurological: no tremors/no numbness/no tingling/no dizziness  I reviewed pt's medications, allergies, PMH, social hx, family hx, and changes were  documented in the history of present illness. Otherwise, unchanged from my initial visit note.  Past Medical History:  Diagnosis Date  . Adenomatous colon polyp 2011  . Anemia   . Angina pectoris ([place]) 05/01/2015   med rx for 95% OM (u/a to access due to tortuosity), 95% D1 and other moderate CAD at cath  . Arthritis    "back, knees" (05/01/2015)  . Chest pain    pleuritic  . Chronic lower back pain   . Complex tear of medial meniscus of left knee 09/14/2018  . Coronary artery disease   . Depression   . GERD (gastroesophageal reflux disease)   . HTN (hypertension)   . Hyperlipidemia   . Obstructive sleep apnea    noncompliant with CPAP  . Osteoarthritis   . Pneumonia ~ 2014 X 1  . Prostate cancer ([place])   . Refusal of blood transfusions as patient is Jehovah's Witness   . Rotator cuff arthropathy, right   . Rupture of right supraspinatus tendon 01/18/2019  . Type II or unspecified type diabetes mellitus without mention of complication, not stated as uncontrolled    Past Surgical History:  Procedure Laterality Date  . CARDIAC CATHETERIZATION  2004   patent coronary arteries  . CARDIAC CATHETERIZATION  05/01/2015   "tried to put stent in but couldn't"  . CARDIAC CATHETERIZATION N/A 05/01/2015   Procedure: Left Heart Cath and Coronary Angiography;  Surgeon: Belva Crome, MD; LAD 60%, oD1 90%, pD1 70%, D2 70%, CFX patent stent, 30% distal to prev stent, pRCA 20%, OM1 90/95%, NL LV  . CARDIAC CATHETERIZATION N/A 05/01/2015   Procedure: Coronary Stent Intervention;  Surgeon: Belva Crome, MD;  Unsuccessful PCI OM due to tortuosity  . CARDIAC CATHETERIZATION N/A 11/06/2014   Procedure: Left Heart Cath and Coronary Angiography;  Surgeon: Belva Crome, MD;  Location: [facility];  Service: Cardiovascular;  Laterality: N/A;  . CLOSED REDUCTION SHOULDER DISLOCATION Right ~ 1975   "& reattached muscle"  . COLONOSCOPY W/ BIOPSIES AND POLYPECTOMY  X 2  . ESOPHAGOGASTRODUODENOSCOPY  ENDOSCOPY    . KNEE ARTHROSCOPY WITH MEDIAL MENISECTOMY Left 09/14/2018   Procedure: LEFT KNEE ARTHROSCOPY CHONDROPLASY,  WITH MEDIAL MENISECTOMY;  Surgeon: Marchia Bond, MD;  Location: [facility];  Service: Orthopedics;  Laterality: Left;  . PILONIDAL CYST EXCISION    . SHOULDER ARTHROSCOPY WITH DEBRIDEMENT AND BICEP TENDON REPAIR Right 01/18/2019   Procedure: RIGHT SHOULDER ARTHROSCOPY WITH DEBRIDEMENT AND DECOMPRSSION SUBACROMIAL PARTIAL ACROMIOPLASTY;  Surgeon: Marchia Bond, MD;  Location: [facility];  Service: Orthopedics;  Laterality: Right;  . SHOULDER ARTHROSCOPY WITH ROTATOR CUFF REPAIR Right 01/18/2019   Procedure: SHOULDER ARTHROSCOPY WITH ROTATOR CUFF REPAIR;  Surgeon: Marchia Bond, MD;  Location: [facility];  Service: Orthopedics;  Laterality: Right;   Social History   Socioeconomic History  . Marital status: Married    Spouse  name: [name]  . Number of children: 4  . Years of education: Not on file  . Highest education level: High school graduate  Occupational History  . Occupation: Retired    Fish farm manager: UNEMPLOYED  Tobacco Use  . Smoking status: Former Smoker    Packs/day: 0.50    Years: 10.00    Pack years: 5.00    Types: Cigarettes    Quit date: 04/06/1983    Years since quitting: 37.2  . Smokeless tobacco: Never Used  Vaping Use  . Vaping Use: Never used  Substance and Sexual Activity  . Alcohol use: Yes    Alcohol/week: 0.0 standard drinks    Comment: `/26/2017 "might drink a beer q couple months mostly; summertime I might drink 2-3 beers/week"  . Drug use: No  . Sexual activity: Not Currently  Other Topics Concern  . Not on file  Social History Narrative   Lives in [place] with spouse Zollie Ellery).  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   Social Determinants of Health   Financial Resource Strain: Not on file  Food Insecurity: Not on file   Transportation Needs: Not on file  Physical Activity: Not on file  Stress: Not on file  Social Connections: Not on file  Intimate Partner Violence: Not on file   Current Outpatient Medications on File Prior to Visit  Medication Sig Dispense Refill  . albuterol (VENTOLIN HFA) 108 (90 Base) MCG/ACT inhaler INHALE 1-2 PUFFS INTO THE LUNGS EVERY 4 (FOUR) HOURS AS NEEDED FOR WHEEZING OR SHORTNESS OF BREATH. 18 each 2  . amLODipine (NORVASC) 2.5 MG tablet Take 1 tablet (2.5 mg total) by mouth daily. (Patient not taking: Reported on 06/23/2020) 90 tablet 1  . aspirin EC 81 MG tablet Take 81 mg by mouth daily. Swallow whole.    . Blood Glucose Monitoring Suppl (RELION PREMIER BLU MONITOR) DEVI 1 Device by Does not apply route daily. 1 Device 0  . COVID-19 mRNA vaccine, Moderna, 100 MCG/0.5ML injection USE AS DIRECTED .25 mL 0  . fluticasone (FLONASE) 50 MCG/ACT nasal spray Place 1-2 sprays into both nostrils daily. 48 mL 2  . glipiZIDE (GLUCOTROL XL) 5 MG 24 hr tablet TAKE 1 TABLET BY MOUTH TWICE A DAY 180 tablet 1  . glucose blood test strip Use as instructed to check sugar 2 times daily 200 each 5  . hydrALAZINE (APRESOLINE) 50 MG tablet Take 1 tablet (50 mg total) by mouth 3 (three) times daily. 270 tablet 3  . hydrochlorothiazide (HYDRODIURIL) 25 MG tablet TAKE 1 TABLET BY MOUTH EVERY DAY 90 tablet 3  . insulin detemir (LEVEMIR FLEXTOUCH) 100 UNIT/ML FlexPen INJECT 16 UNITS INTO THE SKIN DAILY AT 10 PM.(1 BOX =140 DAYS) 15 mL 3  . Insulin Pen Needle (NOVOFINE PLUS) 32G X 4 MM MISC Use 1x a day 100 each 11  . losartan (COZAAR) 100 MG tablet TAKE 1 TABLET BY MOUTH EVERY DAY 90 tablet 3  . metFORMIN (GLUCOPHAGE) 500 MG tablet TAKE 2 TABLETS (1,000 MG TOTAL) BY MOUTH 2 (TWO) TIMES DAILY WITH A MEAL. 360 tablet 0  . metoprolol tartrate (LOPRESSOR) 50 MG tablet TAKE 1 TABLET BY MOUTH TWICE A DAY 180 tablet 2  . nitroGLYCERIN (NITROSTAT) 0.4 MG SL tablet PLACE 1 TABLET UNDER THE TONGUE EVERY 5 MIN X 3  DOSES AS NEEDED FOR CHEST PAIN 75 tablet 2  . OZEMPIC, 0.25 OR 0.5 MG/DOSE, 2 MG/1.5ML SOPN INJECT 0.5 MG  INTO THE SKIN ONCE A WEEK. 4.5 mL 2  . pravastatin (PRAVACHOL) 40 MG tablet Take 80 mg by mouth daily.    . sertraline (ZOLOFT) 25 MG tablet Take 1 tablet (25 mg total) by mouth daily. 90 tablet 1   No current facility-administered medications on file prior to visit.   Allergies  Allergen Reactions  . Statins Other (See Comments)    REACTION: joint pain Lipitor- headaches Has also tried Livalo, pravachol, zetia, crestor, welchol  . Imdur [Isosorbide Dinitrate]     Headache - "violent"   Family History  Problem Relation Age of Onset  . Coronary artery disease Other        CABG  . Coronary artery disease Mother   . Stroke Mother   . Heart disease Mother   . Diabetes Mother   . Other Father   . Diabetes Sister   . Diabetes Maternal Uncle        x2  . Alcohol abuse Other   . Diabetes Paternal Uncle        x2  . Colon cancer Paternal Uncle   . Colon polyps Neg Hx   . Esophageal cancer Neg Hx     PE: There were no vitals taken for this visit.  Wt Readings from Last 3 Encounters:  06/23/20 253 lb (114.8 kg)  05/27/20 250 lb 9.6 oz (113.7 kg)  04/30/20 253 lb (114.8 kg)   Constitutional: overweight, in NAD Eyes: PERRLA, EOMI, no exophthalmos ENT: moist mucous membranes, no thyromegaly, no cervical lymphadenopathy Cardiovascular: RRR, No MRG Respiratory: CTA B Gastrointestinal: abdomen soft, NT, ND, BS+ Musculoskeletal: no deformities, strength intact in all 4 Skin: moist, warm, no rashes Neurological: no tremor with outstretched hands, DTR normal in all 4  ASSESSMENT: 1. DM2, insulin-dependent, uncontrolled, with complications - CAD, s/p NSTEMI, s/p stent 11/2014 - ED - mild CKD  2. Obesity class II BMI Classification:  < 18.5 underweight   18.5-24.9 normal weight   25.0-29.9 overweight   30.0-34.9 class I obesity   35.0-39.9 class II obesity   ?  40.0 class III obesity   3. HL  PLAN:  1. Patient with uncontrolled type 2 diabetes, on oral antidiabetic regimen with Metformin, sulfonylurea, long-acting insulin and weekly GLP-1 receptor.  We are using a low-dose of Ozempic due to previous GI symptoms (GERD, eructations).  At last visit, sugars were still at goal but higher than before and an HbA1c was also higher, at 6.8%.  I suggested to increase the Ozempic dose but he did not want to do this at the time.  Therefore, we did not change the regimen at that time.  - I suggested to:  Patient Instructions  Please continue: - Metformin 1000 mg 2x a day - Glipizide XL 5 mg 2x a day - break/crush the evening dose - Ozempic 0.25 mg weekly - Levemir 20 units at bedtime  Please come back for a follow-up appointment in 4 months.  - we checked his HbA1c: 7%  - advised to check sugars at different times of the day - 1-2x a day, rotating check times - advised for yearly eye exams >> he is UTD - return to clinic in 4 months  2. Obesity class 2 -She is on Ozempic, which is also help with weight loss, but we have to use a low-dose due to GI symptoms -Weight was stable at last visit  3. HL -Reviewed latest lipid panel from 04/2020: LDL high, as was the total cholesterol.  Triglycerides  and HDL at goal Lab Results  Component Value Date   CHOL 207 (H) 04/30/2020   HDL 41 04/30/2020   LDLCALC 148 (H) 04/30/2020   LDLDIRECT 131 (H) 03/22/2019   TRIG 101 04/30/2020   CHOLHDL 5.0 04/30/2020  -He could not tolerate statins due to muscle weakness   [name], MD PhD [facility] Endocr[facility]

## 2020-07-29 ENCOUNTER — Other Ambulatory Visit: Payer: Self-pay | Admitting: Interventional Cardiology

## 2020-08-04 DIAGNOSIS — H524 Presbyopia: Secondary | ICD-10-CM | POA: Diagnosis not present

## 2020-08-17 ENCOUNTER — Other Ambulatory Visit: Payer: Self-pay

## 2020-08-17 ENCOUNTER — Encounter (HOSPITAL_COMMUNITY): Payer: Self-pay | Admitting: Emergency Medicine

## 2020-08-17 ENCOUNTER — Encounter: Payer: Self-pay | Admitting: Internal Medicine

## 2020-08-17 ENCOUNTER — Emergency Department (HOSPITAL_COMMUNITY)
Admission: EM | Admit: 2020-08-17 | Discharge: 2020-08-17 | Disposition: A | Discharge status: Home / Self Care | Payer: Federal, State, Local not specified - PPO | Attending: Emergency Medicine | Admitting: Emergency Medicine

## 2020-08-17 DIAGNOSIS — Y9301 Activity, walking, marching and hiking: Secondary | ICD-10-CM | POA: Insufficient documentation

## 2020-08-17 DIAGNOSIS — I2510 Atherosclerotic heart disease of native coronary artery without angina pectoris: Secondary | ICD-10-CM | POA: Insufficient documentation

## 2020-08-17 DIAGNOSIS — Z79899 Other long term (current) drug therapy: Secondary | ICD-10-CM | POA: Insufficient documentation

## 2020-08-17 DIAGNOSIS — Z7982 Long term (current) use of aspirin: Secondary | ICD-10-CM | POA: Diagnosis not present

## 2020-08-17 DIAGNOSIS — E119 Type 2 diabetes mellitus without complications: Secondary | ICD-10-CM | POA: Diagnosis not present

## 2020-08-17 DIAGNOSIS — Z8546 Personal history of malignant neoplasm of prostate: Secondary | ICD-10-CM | POA: Insufficient documentation

## 2020-08-17 DIAGNOSIS — S80869A Insect bite (nonvenomous), unspecified lower leg, initial encounter: Secondary | ICD-10-CM | POA: Diagnosis not present

## 2020-08-17 DIAGNOSIS — W57XXXA Bitten or stung by nonvenomous insect and other nonvenomous arthropods, initial encounter: Secondary | ICD-10-CM | POA: Insufficient documentation

## 2020-08-17 DIAGNOSIS — Z87891 Personal history of nicotine dependence: Secondary | ICD-10-CM | POA: Insufficient documentation

## 2020-08-17 DIAGNOSIS — I10 Essential (primary) hypertension: Secondary | ICD-10-CM | POA: Diagnosis not present

## 2020-08-17 DIAGNOSIS — Z9861 Coronary angioplasty status: Secondary | ICD-10-CM | POA: Insufficient documentation

## 2020-08-17 DIAGNOSIS — Z7984 Long term (current) use of oral hypoglycemic drugs: Secondary | ICD-10-CM | POA: Insufficient documentation

## 2020-08-17 DIAGNOSIS — S80811A Abrasion, right lower leg, initial encounter: Secondary | ICD-10-CM | POA: Diagnosis not present

## 2020-08-17 DIAGNOSIS — Z794 Long term (current) use of insulin: Secondary | ICD-10-CM | POA: Insufficient documentation

## 2020-08-17 NOTE — ED Provider Notes (Signed)
[facility] DEPT Provider Note  CSN: [id number] Arrival date & time: 08/17/20 0127  Chief Complaint(s) Insect Bite  HPI [name] is a 70 y.o. male here for right lower leg wound.  He reports that he was walking outdoors in tall grass at night when he suddenly felt something hit him in the leg.  He thought it might have been a spider or snake bite.  He did not see anything bite him.  Reports that the incident was very fast.  Bleeding has been controlled.  HPI  Past Medical History Past Medical History:  Diagnosis Date  . Adenomatous colon polyp 2011  . Anemia   . Angina pectoris ([place]) 05/01/2015   med rx for 95% OM (u/a to access due to tortuosity), 95% D1 and other moderate CAD at cath  . Arthritis    "back, knees" (05/01/2015)  . Chest pain    pleuritic  . Chronic lower back pain   . Complex tear of medial meniscus of left knee 09/14/2018  . Coronary artery disease   . Depression   . GERD (gastroesophageal reflux disease)   . HTN (hypertension)   . Hyperlipidemia   . Obstructive sleep apnea    noncompliant with CPAP  . Osteoarthritis   . Pneumonia ~ 2014 X 1  . Prostate cancer ([place])   . Refusal of blood transfusions as patient is Jehovah's Witness   . Rotator cuff arthropathy, right   . Rupture of right supraspinatus tendon 01/18/2019  . Type II or unspecified type diabetes mellitus without mention of complication, not stated as uncontrolled    Patient Active Problem List   Diagnosis Date Noted  . Statin myopathy 04/04/2019  . Statin intolerance -weakness, stiffness 02/13/2019  . Rupture of right supraspinatus tendon 01/18/2019  . Complex tear of medial meniscus of left knee 09/14/2018  . Malignant neoplasm of prostate ([place]) 01/04/2018  . Refusal of blood transfusions as patient is Jehovah's Witness   . Pneumonia   . Osteoarthritis   . Obstructive sleep apnea   . Hyperlipidemia   . HTN (hypertension)   . GERD (gastroesophageal  reflux disease)   . Depression   . Coronary artery disease   . Chronic lower back pain   . Chest pain   . Arthritis   . Anemia   . Benign essential HTN 01/03/2017  . PAT (paroxysmal atrial tachycardia) ([place]) 10/27/2015  . Type 2 diabetes mellitus with circulatory disorder, without long-term current use of insulin ([place]) 06/19/2015  . Angina pectoris ([place])   . Elevated PSA 12/20/2014  . CAD (coronary artery disease)   . Obstructive sleep apnea 09/06/2014  . Personal history of colonic polyps 12/13/2013  . Blood in stool 12/13/2013  . Prostatitis, acute 12/13/2013  . Insomnia 04/19/2013  . Obesity with body mass index of 30.0-39.9 10/18/2012  . Right lumbar radiculitis 09/29/2011  . Adenomatous colon polyp 04/05/2009  . ORGANIC IMPOTENCE 11/11/2008  . Hyperlipidemia LDL goal <70 08/23/2008  . GERD 08/23/2008  . BPH (benign prostatic hypertrophy) with urinary obstruction 08/23/2008  . OSTEOARTHRITIS 08/23/2008   Home Medication(s) Prior to Admission medications   Medication Sig Start Date End Date Taking? Authorizing Provider  albuterol (VENTOLIN HFA) 108 (90 Base) MCG/ACT inhaler INHALE 1-2 PUFFS INTO THE LUNGS EVERY 4 (FOUR) HOURS AS NEEDED FOR WHEEZING OR SHORTNESS OF BREATH. 03/14/20   Wendie Agreste, MD  amLODipine (NORVASC) 2.5 MG tablet Take 1 tablet (2.5 mg total) by mouth daily. Patient not taking:  Reported on 06/23/2020 04/30/20   Wendie Agreste, MD  aspirin EC 81 MG tablet Take 81 mg by mouth daily. Swallow whole.    [provider]  Blood Glucose Monitoring Suppl (RELION PREMIER BLU MONITOR) DEVI 1 Device by Does not apply route daily. 07/12/16   [name], MD  COVID-19 mRNA vaccine, Moderna, 100 MCG/0.5ML injection USE AS DIRECTED 02/20/20 02/19/21  Carlyle Basques, MD  fluticasone [facility]) 50 MCG/ACT nasal spray Place 1-2 sprays into both nostrils daily. 04/30/20   Wendie Agreste, MD  glipiZIDE (GLUCOTROL XL) 5 MG 24 hr tablet TAKE 1 TABLET BY  MOUTH TWICE A DAY 09/17/19   [name], MD  glucose blood test strip Use as instructed to check sugar 2 times daily 07/12/16   [name], MD  hydrALAZINE (APRESOLINE) 50 MG tablet Take 1 tablet (50 mg total) by mouth 3 (three) times daily. 06/06/20   Belva Crome, MD  hydrochlorothiazide (HYDRODIURIL) 25 MG tablet TAKE 1 TABLET BY MOUTH EVERY DAY 07/29/20   Belva Crome, MD  insulin detemir (LEVEMIR FLEXTOUCH) 100 UNIT/ML FlexPen INJECT 16 UNITS INTO THE SKIN DAILY AT 10 PM.(1 BOX =140 DAYS) 09/28/19   [name], MD  Insulin Pen Needle (NOVOFINE PLUS) 32G X 4 MM MISC Use 1x a day 05/09/14   [name], MD  losartan (COZAAR) 100 MG tablet TAKE 1 TABLET BY MOUTH EVERY DAY 07/29/20   Belva Crome, MD  metFORMIN (GLUCOPHAGE) 500 MG tablet TAKE 2 TABLETS (1,000 MG TOTAL) BY MOUTH 2 (TWO) TIMES DAILY WITH A MEAL. 05/01/20   [name], MD  metoprolol tartrate (LOPRESSOR) 50 MG tablet TAKE 1 TABLET BY MOUTH TWICE A DAY 11/01/19   Belva Crome, MD  nitroGLYCERIN (NITROSTAT) 0.4 MG SL tablet PLACE 1 TABLET UNDER THE TONGUE EVERY 5 MIN X 3 DOSES AS NEEDED FOR CHEST PAIN 08/02/19   Belva Crome, MD  OZEMPIC, 0.25 OR 0.5 MG/DOSE, 2 MG/1.5ML SOPN INJECT 0.5 MG INTO THE SKIN ONCE A WEEK. 04/07/20   [name], MD  pravastatin (PRAVACHOL) 40 MG tablet Take 80 mg by mouth daily.    [provider]  sertraline (ZOLOFT) 25 MG tablet Take 1 tablet (25 mg total) by mouth daily. 04/30/20   Wendie Agreste, MD                                                                                                                                    Past Surgical History Past Surgical History:  Procedure Laterality Date  . CARDIAC CATHETERIZATION  2004   patent coronary arteries  . CARDIAC CATHETERIZATION  05/01/2015   "tried to put stent in but couldn't"  . CARDIAC CATHETERIZATION N/A 05/01/2015   Procedure: Left Heart Cath and Coronary Angiography;  Surgeon: Belva Crome, MD;  LAD 60%, oD1 90%, pD1 70%, D2 70%, CFX patent stent, 30% distal to prev stent, pRCA 20%,  OM1 90/95%, NL LV  . CARDIAC CATHETERIZATION N/A 05/01/2015   Procedure: Coronary Stent Intervention;  Surgeon: Belva Crome, MD;  Unsuccessful PCI OM due to tortuosity  . CARDIAC CATHETERIZATION N/A 11/06/2014   Procedure: Left Heart Cath and Coronary Angiography;  Surgeon: Belva Crome, MD;  Location: [facility];  Service: Cardiovascular;  Laterality: N/A;  . CLOSED REDUCTION SHOULDER DISLOCATION Right ~ 1975   "& reattached muscle"  . COLONOSCOPY W/ BIOPSIES AND POLYPECTOMY  X 2  . ESOPHAGOGASTRODUODENOSCOPY ENDOSCOPY    . KNEE ARTHROSCOPY WITH MEDIAL MENISECTOMY Left 09/14/2018   Procedure: LEFT KNEE ARTHROSCOPY CHONDROPLASY,  WITH MEDIAL MENISECTOMY;  Surgeon: Marchia Bond, MD;  Location: [facility];  Service: Orthopedics;  Laterality: Left;  . PILONIDAL CYST EXCISION    . SHOULDER ARTHROSCOPY WITH DEBRIDEMENT AND BICEP TENDON REPAIR Right 01/18/2019   Procedure: RIGHT SHOULDER ARTHROSCOPY WITH DEBRIDEMENT AND DECOMPRSSION SUBACROMIAL PARTIAL ACROMIOPLASTY;  Surgeon: Marchia Bond, MD;  Location: [facility];  Service: Orthopedics;  Laterality: Right;  . SHOULDER ARTHROSCOPY WITH ROTATOR CUFF REPAIR Right 01/18/2019   Procedure: SHOULDER ARTHROSCOPY WITH ROTATOR CUFF REPAIR;  Surgeon: Marchia Bond, MD;  Location: [facility];  Service: Orthopedics;  Laterality: Right;   Family History Family History  Problem Relation Age of Onset  . Coronary artery disease Other        CABG  . Coronary artery disease Mother   . Stroke Mother   . Heart disease Mother   . Diabetes Mother   . Other Father   . Diabetes Sister   . Diabetes Maternal Uncle        x2  . Alcohol abuse Other   . Diabetes Paternal Uncle        x2  . Colon cancer Paternal Uncle   . Colon polyps Neg Hx   . Esophageal cancer Neg Hx     Social History Social History   Tobacco  Use  . Smoking status: Former Smoker    Packs/day: 0.50    Years: 10.00    Pack years: 5.00    Types: Cigarettes    Quit date: 04/06/1983    Years since quitting: 37.3  . Smokeless tobacco: Never Used  Vaping Use  . Vaping Use: Never used  Substance Use Topics  . Alcohol use: Yes    Alcohol/week: 0.0 standard drinks    Comment: `/26/2017 "might drink a beer q couple months mostly; summertime I might drink 2-3 beers/week"  . Drug use: No   Allergies Statins and Imdur [isosorbide dinitrate]  Review of Systems Review of Systems All other systems are reviewed and are negative for acute change except as noted in the HPI  Physical Exam Vital Signs  I have reviewed the triage vital signs BP (!) 186/80   Pulse 67   Temp 98.4 F (36.9 C) (Oral)   Resp 18   Ht 5\' 10"  (1.778 m)   Wt 112 kg   SpO2 97%   BMI 35.44 kg/m   Physical Exam Vitals reviewed.  Constitutional:      General: He is not in acute distress.    Appearance: He is well-developed. He is not diaphoretic.  HENT:     Head: Normocephalic and atraumatic.     Jaw: No trismus.     Right Ear: External ear normal.     Left Ear: External ear normal.     Nose: Nose normal.  Eyes:     General: No scleral  icterus.    Conjunctiva/sclera: Conjunctivae normal.  Neck:     Trachea: Phonation normal.  Cardiovascular:     Rate and Rhythm: Normal rate and regular rhythm.  Pulmonary:     Effort: Pulmonary effort is normal. No respiratory distress.     Breath sounds: No stridor.  Abdominal:     General: There is no distension.  Musculoskeletal:        General: Normal range of motion.     Cervical back: Normal range of motion.  Skin:      Neurological:     Mental Status: He is alert and oriented to person, place, and time.  Psychiatric:        Behavior: Behavior normal.     ED Results and Treatments Labs (all labs ordered are listed, but only abnormal results are displayed) Labs Reviewed - No data to display                                                                                                                        EKG  EKG Interpretation  Date/Time:    Ventricular Rate:    PR Interval:    QRS Duration:   QT Interval:    QTC Calculation:   R Axis:     Text Interpretation:        Radiology No results found.  Pertinent labs & imaging results that were available during my care of the patient were reviewed by me and considered in my medical decision making (see chart for details).  Medications Ordered in ED Medications - No data to display                                                                                                                                  Procedures Procedures  (including critical care time)  Medical Decision Making / ED Course I have reviewed the nursing notes for this encounter and the patient's prior records (if available in EHR or on provided paperwork).   [name] was evaluated in Emergency Department on 08/17/2020 for the symptoms described in the history of present illness. He was evaluated in the context of the global COVID-19 pandemic, which necessitated consideration that the patient might be at risk for infection with the SARS-CoV-2 virus that causes COVID-19. Institutional protocols and algorithms that pertain to the evaluation of patients at risk for COVID-19 are in a state of rapid change  based on information released by regulatory bodies including the CDC and federal and state organizations. These policies and algorithms were followed during the patient's care in the ED.  No bite marks noted.  Abrasion w/o superimposed infection. Close monitoring recommended.  During evaluation patient also noted to be hypertensive.  Reports he is compliant with his medications.  He denies any chest pain or shortness of breath.  No headache.  No focal deficits. Initial blood pressure is worse incorrect due to cuff sizing.  When appropriate cuff used BP  improved.  Still elevated but now with BPs in 180/80s.      Final Clinical Impression(s) / ED Diagnoses Final diagnoses:  Abrasion of right lower extremity, initial encounter    The patient appears reasonably screened and/or stabilized for discharge and I doubt any other medical condition or other [facility] requiring further screening, evaluation, or treatment in the ED at this time prior to discharge. Safe for discharge with strict return precautions.  Disposition: Discharge  Condition: Good  I have discussed the results, Dx and Tx plan with the patient/family who expressed understanding and agree(s) with the plan. Discharge instructions discussed at length. The patient/family was given strict return precautions who verbalized understanding of the instructions. No further questions at time of discharge.    ED Discharge Orders    None       Follow Up: Wendie Agreste, MD [address] [place] [id number] [phone]  Schedule an appointment as soon as possible for a visit in 2 days for wound re-check     This chart was dictated using voice recognition software.  Despite best efforts to proofread,  errors can occur which can change the documentation meaning.   Fatima Blank, MD 08/17/20 [phone]

## 2020-08-17 NOTE — ED Triage Notes (Addendum)
Pt reports that he was working in some tall grass and felt a sharp, stinging pain to his R lower leg. Bleeding sore noted to area. He brought in what looks like a sunflower seed kernel in a tissue that he said was in the wound. A&Ox4. HTN noted and he reports a hx of same, but states that it is not usually this high.

## 2020-08-18 ENCOUNTER — Other Ambulatory Visit: Payer: Self-pay | Admitting: Endocrinology

## 2020-08-21 ENCOUNTER — Telehealth: Payer: Self-pay

## 2020-08-21 NOTE — Telephone Encounter (Signed)
Outbound call to [facility] to provide information needed for a PA. Called [phone] and spoke with Tameka who confirmed approval. Awaiting fax or return call with approval number.

## 2020-08-22 ENCOUNTER — Telehealth: Payer: Self-pay | Admitting: Physical Medicine and Rehabilitation

## 2020-08-22 NOTE — Telephone Encounter (Signed)
Patient's wife called. She would like him to see Dr. Ernestina Patches for an injection. Her call back number is [phone]

## 2020-08-22 NOTE — Telephone Encounter (Signed)
[name] Medicare ph# [phone], Option 5 called to let Dr. Cruzita Lederer know that RX for Ozempic has been approved through 08/21/2021. [name] states they do not have approval numbers to provide.

## 2020-08-25 NOTE — Telephone Encounter (Signed)
Scheduled for new patient OV for neck and shoulder pain.

## 2020-09-03 ENCOUNTER — Encounter: Payer: Self-pay | Admitting: Physical Medicine and Rehabilitation

## 2020-09-03 ENCOUNTER — Ambulatory Visit: Payer: Self-pay

## 2020-09-03 ENCOUNTER — Other Ambulatory Visit: Payer: Self-pay

## 2020-09-03 ENCOUNTER — Ambulatory Visit (INDEPENDENT_AMBULATORY_CARE_PROVIDER_SITE_OTHER): Payer: Federal, State, Local not specified - PPO | Admitting: Physical Medicine and Rehabilitation

## 2020-09-03 VITALS — BP 166/95 | HR 70

## 2020-09-03 DIAGNOSIS — M47812 Spondylosis without myelopathy or radiculopathy, cervical region: Secondary | ICD-10-CM | POA: Diagnosis not present

## 2020-09-03 DIAGNOSIS — M542 Cervicalgia: Secondary | ICD-10-CM

## 2020-09-03 DIAGNOSIS — M25511 Pain in right shoulder: Secondary | ICD-10-CM

## 2020-09-03 DIAGNOSIS — M5412 Radiculopathy, cervical region: Secondary | ICD-10-CM

## 2020-09-03 DIAGNOSIS — G8929 Other chronic pain: Secondary | ICD-10-CM

## 2020-09-03 NOTE — Progress Notes (Signed)
Right sided neck pain with pain in posterior right shoulder. Has been to several doctors outside of [facility] for this complaint and had x-rays there.  History of rotator cuff surgery a year ago- [facility] Ip. Ordered neck brace on [place] and reports that this helps some. Numeric Pain Rating Scale and Functional Assessment Average Pain 4 Pain Right Now 2 My pain is intermittent, dull and aching Pain is worse with: some activites Pain improves with: rest   In the last MONTH (on 0-10 scale) has pain interfered with the following?  1. General activity like being  able to carry out your everyday physical activities such as walking, climbing stairs, carrying groceries, or moving a chair?  Rating(5)  2. Relation with others like being able to carry out your usual social activities and roles such as  activities at home, at work and in your community. Rating(5)  3. Enjoyment of life such that you have  been bothered by emotional problems such as feeling anxious, depressed or irritable?  Rating(5)

## 2020-09-03 NOTE — Progress Notes (Signed)
[name] - 70 y.o. male MRN [id number]  Date of birth: 08/27/1950  Office Visit Note: Visit Date: 09/03/2020 PCP: Wendie Agreste, MD Referred by: Wendie Agreste, MD  Subjective: Chief Complaint  Patient presents with   Neck - Pain   Right Shoulder - Pain   HPI: [name] is a 70 y.o. male who comes in today For new patient evaluation for chronic worsening severe neck pain with right shoulder pain.  Patient comes in today at the request really of his wife who we have seen.  He reports chronic many year history of neck and right shoulder pain particularly in the right posterior shoulder.  He reports he has been to several doctors outside of the [facility] system and no real help at this point.  He has had multiple rounds of physical therapy with shoulder injection etc.  He does have a history of rotator cuff surgery 1 year ago at [facility].  He describes the pain as 4 out of 10 on average with intermittent dull and aching pain worse with some activities better at rest and with less activity.  He has been using a neck brace that he ordered off of [facility] that seems to help to a degree.  Nothing really down the arms per se.  No focal tingling or weakness.  No history of carpal tunnel syndrome.  Patient is a diabetic.  Hemoglobin A1c fairly good control.  Medications really have not been helpful including pain medication and nonsteroidal anti-inflammatory.  Reports no recent cervical spine imaging.  No cervical MRI except from 2013.  That is reviewed below.  I also reviewed MRI of the shoulder which is below.  Review of Systems  Musculoskeletal:  Positive for back pain, joint pain and neck pain.  All other systems reviewed and are negative. Otherwise per HPI.  Assessment & Plan: Visit Diagnoses:    ICD-10-CM   1. Neck pain  M54.2 XR Cervical Spine 2 or 3 views    2. Radiculopathy, cervical region  M54.12 MR CERVICAL SPINE WO CONTRAST    3. Cervicalgia  M54.2     4. Cervical  spondylosis without myelopathy  M47.812     5. Chronic right shoulder pain  M25.511    G89.29        Plan: Findings:  Multifactorial chronic severe at times intermittently not so bad with least activity neck pain and right posterior shoulder pain.  Likely does have some intrinsic shoulder pain from prior rotator cuff issues and bursitis.  Pain seems to be more radicular in nature now referring into the shoulder laterally some into the forearm at times.  Cervical spine x-ray shows degenerative changes particular at C5-6 with uncovertebral joint hypertrophy and foraminal narrowing on the right.  Old MRI is very similar to that.  I think the neck step really is to repeat the MRI of the cervical spine with goal towards potential for diagnostic cervical epidural injection.  Depending on MRI would dictate other therapy or possible surgical consideration.   Meds & Orders: No orders of the defined types were placed in this encounter.   Orders Placed This Encounter  Procedures   XR Cervical Spine 2 or 3 views   MR CERVICAL SPINE WO CONTRAST    Follow-up: No follow-ups on file.   Procedures: No procedures performed      Clinical History: MRI OF THE RIGHT SHOULDER WITHOUT CONTRAST  TECHNIQUE: Multiplanar, multisequence MR imaging of the shoulder was performed. No  intravenous contrast was administered.  COMPARISON: None.  FINDINGS: Rotator cuff: There is a small focal transverse tear involving the footprint attachment region of the supraspinatus tendon anteriorly. This contacts the bursal surface. There is also a small oblique component. Otherwise, the rotator cuff tendons appear normal.  Muscles: Normal  Biceps long head: Intact  Acromioclavicular Joint: Mild AC joint degenerative changes. Type 2-3 acromion. Mild lateral downsloping and mild undersurface spurring.  Glenohumeral Joint: No degenerative changes or joint effusion or synovitis.  Labrum: No definite labral  tears.  Bones: No acute bony findings.  Other: Moderate subacromial/subdeltoid bursitis.  IMPRESSION: 1. Small focal deep bursal surface tear involving the footprint attachment region of the supraspinatus tendon anteriorly. 2. Intact long head biceps tendon and glenoid labrum. 3. Moderate subacromial/subdeltoid bursitis. 4. Type 2-3 acromion with mild lateral downsloping and mild undersurface spurring.   Electronically Signed By: Marijo Sanes M.D. On: 11/02/2018 15:35 ----------   MRI CERVICAL SPINE WITHOUT CONTRAST 2013  Technique: Multiplanar and multiecho pulse sequences of the cervical spine, to include the craniocervical junction and cervicothoracic junction, were obtained according to standard protocol without intravenous contrast.  Comparison: Cervical radiographs dated 06/07/2011  Findings: The visualized intracranial contents are normal. Cervical spinal cord is normal. Paraspinal soft tissues are normal.  C1-2 and C2-3: Normal.  C3-4: Small broad-based disc bulge with accompanying osteophytes. No neural impingement.  C4-5: Disc space narrowing with small broad-based disc bulge with accompanying osteophytes. No neural impingement.  C5-6: Slightly more prominent broad-based disc bulge and osteophytes asymmetric to the right with slight narrowing of the right neural foramen and right lateral recess which might affect the right C6 nerve. No disc protrusion.  C6-7: Small broad-based disc bulge with slight uncinate spurring without neural impingement.  C7-T1 through T2-3: No significant abnormality.  There is no significant facet arthritis in the cervical spine.  IMPRESSION:  1. Multilevel degenerative disc disease with no disc protrusions. 2. Asymmetric bulge and osteophytes at C5-6 to the right with slight right foraminal stenosis which might affect the right C6 nerve.   He reports that he quit smoking about 37 years ago. His smoking use included  cigarettes. He has a 5.00 pack-year smoking history. He has never used smokeless tobacco.  Recent Labs    01/31/20 1413 09/09/20 1342  HGBA1C 6.8* 6.6*    Objective:  VS:  HT:    WT:   BMI:     BP:(!) 166/95  HR:70bpm  TEMP: ( )  RESP:  Physical Exam Vitals and nursing note reviewed.  Constitutional:      General: He is not in acute distress.    Appearance: Normal appearance. He is not ill-appearing.  HENT:     Head: Normocephalic and atraumatic.     Right Ear: External ear normal.     Left Ear: External ear normal.  Eyes:     Extraocular Movements: Extraocular movements intact.  Cardiovascular:     Rate and Rhythm: Normal rate.     Pulses: Normal pulses.  Abdominal:     General: There is no distension.     Palpations: Abdomen is soft.  Musculoskeletal:        General: No signs of injury.     Cervical back: Neck supple. Tenderness present. No rigidity.     Right lower leg: No edema.     Left lower leg: No edema.     Comments: He sits with a forward flexed cervical spine.  Pain at end ranges of right rotation more  than left.  Negative Spurling's test bilaterally but does elicit pain on the right.  Some shoulder impingement signs right more than left but not necessarily concordant with his allover pain.  Mild focal trigger points.  Patient has good strength in the upper extremities with 5 out of 5 strength in wrist extension long finger flexion APB.  No intrinsic hand muscle atrophy.  Negative Hoffmann's test.  Lymphadenopathy:     Cervical: No cervical adenopathy.  Skin:    Findings: No erythema or rash.  Neurological:     General: No focal deficit present.     Mental Status: He is alert and oriented to person, place, and time.     Sensory: No sensory deficit.     Motor: No weakness or abnormal muscle tone.     Coordination: Coordination normal.  Psychiatric:        Mood and Affect: Mood normal.        Behavior: Behavior normal.    Ortho Exam  Imaging: No results  found.   Past Medical/Family/Surgical/Social History: Medications & Allergies reviewed per EMR, new medications updated. Patient Active Problem List   Diagnosis Date Noted   Statin myopathy 04/04/2019   Statin intolerance -weakness, stiffness 02/13/2019   Rupture of right supraspinatus tendon 01/18/2019   Complex tear of medial meniscus of left knee 09/14/2018   Malignant neoplasm of prostate ([place]) 01/04/2018   Refusal of blood transfusions as patient is Jehovah's Witness    Pneumonia    Osteoarthritis    Obstructive sleep apnea    Hyperlipidemia    HTN (hypertension)    GERD (gastroesophageal reflux disease)    Depression    Coronary artery disease    Chronic lower back pain    Chest pain    Arthritis    Anemia    Benign essential HTN 01/03/2017   PAT (paroxysmal atrial tachycardia) ([place]) 10/27/2015   Type 2 diabetes mellitus with circulatory disorder, without long-term current use of insulin ([place]) 06/19/2015   Angina pectoris ([place])    Elevated PSA 12/20/2014   CAD (coronary artery disease)    Obstructive sleep apnea 09/06/2014   Personal history of colonic polyps 12/13/2013   Blood in stool 12/13/2013   Prostatitis, acute 12/13/2013   Insomnia 04/19/2013   Obesity with body mass index of 30.0-39.9 10/18/2012   Right lumbar radiculitis 09/29/2011   Adenomatous colon polyp 04/05/2009   ORGANIC IMPOTENCE 11/11/2008   Hyperlipidemia LDL goal <70 08/23/2008   GERD 08/23/2008   BPH (benign prostatic hypertrophy) with urinary obstruction 08/23/2008   OSTEOARTHRITIS 08/23/2008   Past Medical History:  Diagnosis Date   Adenomatous colon polyp 2011   Anemia    Angina pectoris ([place]) 05/01/2015   med rx for 95% OM (u/a to access due to tortuosity), 95% D1 and other moderate CAD at cath   Arthritis    "back, knees" (05/01/2015)   Chest pain    pleuritic   Chronic lower back pain    Complex tear of medial meniscus of left knee 09/14/2018   Coronary artery disease     Depression    GERD (gastroesophageal reflux disease)    HTN (hypertension)    Hyperlipidemia    Obstructive sleep apnea    noncompliant with CPAP   Osteoarthritis    Pneumonia ~ 2014 X 1   Prostate cancer ([place])    Refusal of blood transfusions as patient is Jehovah's Witness    Rotator cuff arthropathy, right    Rupture of right supraspinatus  tendon 01/18/2019   Type II or unspecified type diabetes mellitus without mention of complication, not stated as uncontrolled    Family History  Problem Relation Age of Onset   Coronary artery disease Other        CABG   Coronary artery disease Mother    Stroke Mother    Heart disease Mother    Diabetes Mother    Other Father    Diabetes Sister    Diabetes Maternal Uncle        x2   Alcohol abuse Other    Diabetes Paternal Uncle        x2   Colon cancer Paternal Uncle    Colon polyps Neg Hx    Esophageal cancer Neg Hx    Past Surgical History:  Procedure Laterality Date   CARDIAC CATHETERIZATION  2004   patent coronary arteries   CARDIAC CATHETERIZATION  05/01/2015   "tried to put stent in but couldn't"   CARDIAC CATHETERIZATION N/A 05/01/2015   Procedure: Left Heart Cath and Coronary Angiography;  Surgeon: Belva Crome, MD; LAD 60%, oD1 90%, pD1 70%, D2 70%, CFX patent stent, 30% distal to prev stent, pRCA 20%, OM1 90/95%, NL LV   CARDIAC CATHETERIZATION N/A 05/01/2015   Procedure: Coronary Stent Intervention;  Surgeon: Belva Crome, MD;  Unsuccessful PCI OM due to tortuosity   CARDIAC CATHETERIZATION N/A 11/06/2014   Procedure: Left Heart Cath and Coronary Angiography;  Surgeon: Belva Crome, MD;  Location: [place] [facility];  Service: Cardiovascular;  Laterality: N/A;   CLOSED REDUCTION SHOULDER DISLOCATION Right ~ 1975   "& reattached muscle"   COLONOSCOPY W/ BIOPSIES AND POLYPECTOMY  X 2   ESOPHAGOGASTRODUODENOSCOPY ENDOSCOPY     KNEE ARTHROSCOPY WITH MEDIAL MENISECTOMY Left 09/14/2018   Procedure: LEFT KNEE ARTHROSCOPY  CHONDROPLASY,  WITH MEDIAL MENISECTOMY;  Surgeon: Marchia Bond, MD;  Location: [facility];  Service: Orthopedics;  Laterality: Left;   PILONIDAL CYST EXCISION     SHOULDER ARTHROSCOPY WITH DEBRIDEMENT AND BICEP TENDON REPAIR Right 01/18/2019   Procedure: RIGHT SHOULDER ARTHROSCOPY WITH DEBRIDEMENT AND DECOMPRSSION SUBACROMIAL PARTIAL ACROMIOPLASTY;  Surgeon: Marchia Bond, MD;  Location: [place];  Service: Orthopedics;  Laterality: Right;   SHOULDER ARTHROSCOPY WITH ROTATOR CUFF REPAIR Right 01/18/2019   Procedure: SHOULDER ARTHROSCOPY WITH ROTATOR CUFF REPAIR;  Surgeon: Marchia Bond, MD;  Location: [facility];  Service: Orthopedics;  Laterality: Right;   Social History   Occupational History   Occupation: Retired    Fish farm manager: UNEMPLOYED  Tobacco Use   Smoking status: Former    Packs/day: 0.50    Years: 10.00    Pack years: 5.00    Types: Cigarettes    Quit date: 04/06/1983    Years since quitting: 37.5   Smokeless tobacco: Never  Vaping Use   Vaping Use: Never used  Substance and Sexual Activity   Alcohol use: Yes    Alcohol/week: 0.0 standard drinks    Comment: `/26/2017 "might drink a beer q couple months mostly; summertime I might drink 2-3 beers/week"   Drug use: No   Sexual activity: Not Currently

## 2020-09-09 ENCOUNTER — Ambulatory Visit (INDEPENDENT_AMBULATORY_CARE_PROVIDER_SITE_OTHER): Payer: Federal, State, Local not specified - PPO | Admitting: Internal Medicine

## 2020-09-09 ENCOUNTER — Other Ambulatory Visit: Payer: Self-pay

## 2020-09-09 ENCOUNTER — Encounter: Payer: Self-pay | Admitting: Internal Medicine

## 2020-09-09 VITALS — BP 150/88 | HR 72 | Ht 70.0 in | Wt 248.6 lb

## 2020-09-09 DIAGNOSIS — E785 Hyperlipidemia, unspecified: Secondary | ICD-10-CM | POA: Diagnosis not present

## 2020-09-09 DIAGNOSIS — E669 Obesity, unspecified: Secondary | ICD-10-CM

## 2020-09-09 DIAGNOSIS — E1159 Type 2 diabetes mellitus with other circulatory complications: Secondary | ICD-10-CM | POA: Diagnosis not present

## 2020-09-09 LAB — POCT GLYCOSYLATED HEMOGLOBIN (HGB A1C): Hemoglobin A1C: 6.6 % — AB (ref 4.0–5.6)

## 2020-09-09 MED ORDER — METFORMIN HCL 500 MG PO TABS: 2.0000 | ORAL_TABLET | Freq: Two times a day (BID) | ORAL | 3 refills | Status: DC

## 2020-09-09 MED ORDER — GLIPIZIDE ER 5 MG PO TB24: 5.0000 mg | ORAL_TABLET | Freq: Two times a day (BID) | ORAL | 3 refills | Status: DC

## 2020-09-09 MED ORDER — OZEMPIC (0.25 OR 0.5 MG/DOSE) 2 MG/1.5ML ~~LOC~~ SOPN: 0.5000 mg | PEN_INJECTOR | SUBCUTANEOUS | 3 refills | Status: DC

## 2020-09-09 NOTE — Progress Notes (Signed)
Patient ID: [name], male   DOB: 1950-12-14, 70 y.o.   MRN: [id number]  This visit occurred during the SARS-CoV-2 public health emergency.  Safety protocols were in place, including screening questions prior to the visit, additional usage of staff PPE, and extensive cleaning of exam room while observing appropriate contact time as indicated for disinfecting solutions.   HPI: [name] is a 70 y.o.-year-old male, returning for f/u for DM2, dx 2007, insulin-dependent since 2014, uncontrolled, with complications (CAD - s/p NSTEMI 11/2014 - stent; ED; mild CKD). Last visit 7 months ago. PCP: Dr Odette Fraction.  Interim history: Before last visit he started Zoloft and his fatigue and depression were much better.  At this visit, he continues to feel well, without complaints. He tells me that he actually stopped insulin as he could not refill it after last visit.  His sugars did not increase, in fact, they improved.  She lost 8 pounds since last visit. No increased urination, blurry vision, nausea, chest pain.  Reviewed HbA1c levels: Lab Results  Component Value Date   HGBA1C 6.8 (A) 01/31/2020   HGBA1C 6.7 (A) 09/27/2019   HGBA1C 7.9 (A) 08/13/2019   Pt is on a regimen of: - Metformin 1000 mg 2x a day - Glipizide XL 5 mg 2x a day -crushes the evening dose - >> off since last OV as he could not refill - Ozempic 0.5 mg weekly >> recommended 10/2018 >> finally started 02/2019 - took 4 doses and stopped for fear of SEs >> restarted 08/2019 at 0.25 mg weekly He tried Toujeo 20 units at night but did not like it He did not start Januvia as suggested at last visit ("I don't think I need it"). He was Levemir 140 units in am >> decreased to 70 units>> stopped as this was too expensive for him He stopped Welchol 3.7g at bedtime a day. Stopped Farxiga 10 mg b/c frequent urination and urgency. He was on Glipizide in the past. He was on Metformin in the past >> no N/V.   Pt checks his sugars once a  day: - am:  150 >> 130-140 >> 98-100 >> 120-130 >> 90-120 - 2h after b'fast: n/c >> 128, 134 >> n/c - before lunch: n/c >> 140s >> n/c >> 100-120 >> n/c - 2h after lunch: n/c >> 140-157 >> n/c >> 130s - before dinner:  106-120 >> n/c >> 80-130 >> n/c - 2h after dinner: 200-245 >> 150-180, 200 >> n/c - bedtime:   180-200 >> 230-240 >> 137-140 >> 150 >> n/c - nighttime:94, 190-305 >> 148-216 >> n/c  Lowest sugar was 130 >> 23!!! when took Ozempic every day by mistake >> 101 >> 90; he has hypoglycemia awareness in the 80s Highest sugar was 240 >> 180 >> 200 x1 >> 170.  -+ Mild CKD, last BUN/creatinine:  Lab Results  Component Value Date   BUN 10 04/30/2020   CREATININE 0.89 04/30/2020  09/07/2016: 13/0.9, glucose 133 On losartan. -+ HL; last set of lipids: Lab Results  Component Value Date   CHOL 207 (H) 04/30/2020   HDL 41 04/30/2020   LDLCALC 148 (H) 04/30/2020   LDLDIRECT 131 (H) 03/22/2019   TRIG 101 04/30/2020   CHOLHDL 5.0 04/30/2020  09/07/2016: 167/65/33/121 He is on pravastatin but could not tolerate it daily due to muscle aches and weakness (he has not taken it lately).  He also stopped Repatha due to muscle weakness. - last eye exam was in 04/02/2020: No DR;  Dr. Lady Gary. - He denies  numbness and tingling in his feet.  Pt was admitted for CP in 11/2014: NSTEMI >> CAD >> PTCA >> stent placed.  He was in ED with CP 05/2017 >> no cardiac pathology. He had shoulder surgery 01/18/2019 after rupture of his supraspinatus tendon. In 2020, he had radiotherapy for his prostate cancer (with gold beads).    ROS: Constitutional: No weight gain/+ weight loss, no fatigue, no subjective hyperthermia, no subjective hypothermia Eyes: no blurry vision, no xerophthalmia ENT: no sore throat, no nodules palpated in neck, no dysphagia, no odynophagia, no hoarseness Cardiovascular: no CP/no SOB/no palpitations/no leg swelling Respiratory: no cough/no SOB/no wheezing Gastrointestinal:  no N/no V/no D/no C/+ acid reflux Musculoskeletal: no muscle aches/no joint aches Skin: no rashes, no hair loss Neurological: no tremors/no numbness/no tingling/no dizziness  I reviewed pt's medications, allergies, PMH, social hx, family hx, and changes were documented in the history of present illness. Otherwise, unchanged from my initial visit note.  Past Medical History:  Diagnosis Date  . Adenomatous colon polyp 2011  . Anemia   . Angina pectoris ([place]) 05/01/2015   med rx for 95% OM (u/a to access due to tortuosity), 95% D1 and other moderate CAD at cath  . Arthritis    "back, knees" (05/01/2015)  . Chest pain    pleuritic  . Chronic lower back pain   . Complex tear of medial meniscus of left knee 09/14/2018  . Coronary artery disease   . Depression   . GERD (gastroesophageal reflux disease)   . HTN (hypertension)   . Hyperlipidemia   . Obstructive sleep apnea    noncompliant with CPAP  . Osteoarthritis   . Pneumonia ~ 2014 X 1  . Prostate cancer ([place])   . Refusal of blood transfusions as patient is Jehovah's Witness   . Rotator cuff arthropathy, right   . Rupture of right supraspinatus tendon 01/18/2019  . Type II or unspecified type diabetes mellitus without mention of complication, not stated as uncontrolled    Past Surgical History:  Procedure Laterality Date  . CARDIAC CATHETERIZATION  2004   patent coronary arteries  . CARDIAC CATHETERIZATION  05/01/2015   "tried to put stent in but couldn't"  . CARDIAC CATHETERIZATION N/A 05/01/2015   Procedure: Left Heart Cath and Coronary Angiography;  Surgeon: Belva Crome, MD; LAD 60%, oD1 90%, pD1 70%, D2 70%, CFX patent stent, 30% distal to prev stent, pRCA 20%, OM1 90/95%, NL LV  . CARDIAC CATHETERIZATION N/A 05/01/2015   Procedure: Coronary Stent Intervention;  Surgeon: Belva Crome, MD;  Unsuccessful PCI OM due to tortuosity  . CARDIAC CATHETERIZATION N/A 11/06/2014   Procedure: Left Heart Cath and Coronary Angiography;   Surgeon: Belva Crome, MD;  Location: [facility];  Service: Cardiovascular;  Laterality: N/A;  . CLOSED REDUCTION SHOULDER DISLOCATION Right ~ 1975   "& reattached muscle"  . COLONOSCOPY W/ BIOPSIES AND POLYPECTOMY  X 2  . ESOPHAGOGASTRODUODENOSCOPY ENDOSCOPY    . KNEE ARTHROSCOPY WITH MEDIAL MENISECTOMY Left 09/14/2018   Procedure: LEFT KNEE ARTHROSCOPY CHONDROPLASY,  WITH MEDIAL MENISECTOMY;  Surgeon: Marchia Bond, MD;  Location: [facility];  Service: Orthopedics;  Laterality: Left;  . PILONIDAL CYST EXCISION    . SHOULDER ARTHROSCOPY WITH DEBRIDEMENT AND BICEP TENDON REPAIR Right 01/18/2019   Procedure: RIGHT SHOULDER ARTHROSCOPY WITH DEBRIDEMENT AND DECOMPRSSION SUBACROMIAL PARTIAL ACROMIOPLASTY;  Surgeon: Marchia Bond, MD;  Location: [facility];  Service: Orthopedics;  Laterality: Right;  .  SHOULDER ARTHROSCOPY WITH ROTATOR CUFF REPAIR Right 01/18/2019   Procedure: SHOULDER ARTHROSCOPY WITH ROTATOR CUFF REPAIR;  Surgeon: Marchia Bond, MD;  Location: [facility];  Service: Orthopedics;  Laterality: Right;   Social History   Socioeconomic History  . Marital status: Married    Spouse name: [name]  . Number of children: 4  . Years of education: Not on file  . Highest education level: High school graduate  Occupational History  . Occupation: Retired    Fish farm manager: UNEMPLOYED  Tobacco Use  . Smoking status: Former Smoker    Packs/day: 0.50    Years: 10.00    Pack years: 5.00    Types: Cigarettes    Quit date: 04/06/1983    Years since quitting: 37.4  . Smokeless tobacco: Never Used  Vaping Use  . Vaping Use: Never used  Substance and Sexual Activity  . Alcohol use: Yes    Alcohol/week: 0.0 standard drinks    Comment: `/26/2017 "might drink a beer q couple months mostly; summertime I might drink 2-3 beers/week"  . Drug use: No  . Sexual activity: Not Currently  Other Topics Concern  . Not on file  Social History  Narrative   Lives in [place] with spouse Mahlon Gabrielle).  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   Social Determinants of Health   Financial Resource Strain: Not on file  Food Insecurity: Not on file  Transportation Needs: Not on file  Physical Activity: Not on file  Stress: Not on file  Social Connections: Not on file  Intimate Partner Violence: Not on file   Current Outpatient Medications on File Prior to Visit  Medication Sig Dispense Refill  . albuterol (VENTOLIN HFA) 108 (90 Base) MCG/ACT inhaler INHALE 1-2 PUFFS INTO THE LUNGS EVERY 4 (FOUR) HOURS AS NEEDED FOR WHEEZING OR SHORTNESS OF BREATH. 18 each 2  . amLODipine (NORVASC) 2.5 MG tablet Take 1 tablet (2.5 mg total) by mouth daily. (Patient not taking: No sig reported) 90 tablet 1  . aspirin EC 81 MG tablet Take 81 mg by mouth daily. Swallow whole.    . Blood Glucose Monitoring Suppl (RELION PREMIER BLU MONITOR) DEVI 1 Device by Does not apply route daily. 1 Device 0  . COVID-19 mRNA vaccine, Moderna, 100 MCG/0.5ML injection USE AS DIRECTED .25 mL 0  . fluticasone (FLONASE) 50 MCG/ACT nasal spray Place 1-2 sprays into both nostrils daily. 48 mL 2  . glipiZIDE (GLUCOTROL XL) 5 MG 24 hr tablet TAKE 1 TABLET BY MOUTH TWICE A DAY 180 tablet 1  . glucose blood test strip Use as instructed to check sugar 2 times daily 200 each 5  . hydrALAZINE (APRESOLINE) 50 MG tablet Take 1 tablet (50 mg total) by mouth 3 (three) times daily. 270 tablet 3  . hydrochlorothiazide (HYDRODIURIL) 25 MG tablet TAKE 1 TABLET BY MOUTH EVERY DAY 90 tablet 3  . insulin detemir (LEVEMIR FLEXTOUCH) 100 UNIT/ML FlexPen INJECT 16 UNITS INTO THE SKIN DAILY AT 10 PM.(1 BOX =140 DAYS) 15 mL 3  . Insulin Pen Needle (NOVOFINE PLUS) 32G X 4 MM MISC Use 1x a day 100 each 11  . losartan (COZAAR) 100 MG tablet TAKE 1 TABLET BY MOUTH EVERY DAY 90 tablet 3  . metFORMIN (GLUCOPHAGE) 500 MG tablet TAKE 2 TABLETS (1,000 MG  TOTAL) BY MOUTH 2 (TWO) TIMES DAILY WITH A MEAL. 360 tablet 0  . metoprolol tartrate (LOPRESSOR) 50 MG tablet TAKE  1 TABLET BY MOUTH TWICE A DAY 180 tablet 2  . nitroGLYCERIN (NITROSTAT) 0.4 MG SL tablet PLACE 1 TABLET UNDER THE TONGUE EVERY 5 MIN X 3 DOSES AS NEEDED FOR CHEST PAIN 75 tablet 2  . OZEMPIC, 0.25 OR 0.5 MG/DOSE, 2 MG/1.5ML SOPN INJECT 0.5 MG INTO THE SKIN ONCE A WEEK. 4.5 mL 2  . pravastatin (PRAVACHOL) 40 MG tablet Take 80 mg by mouth daily.    . sertraline (ZOLOFT) 25 MG tablet Take 1 tablet (25 mg total) by mouth daily. 90 tablet 1   No current facility-administered medications on file prior to visit.   Allergies  Allergen Reactions  . Statins Other (See Comments)    REACTION: joint pain Lipitor- headaches Has also tried Livalo, pravachol, zetia, crestor, welchol  . Imdur [Isosorbide Dinitrate]     Headache - "violent"   Family History  Problem Relation Age of Onset  . Coronary artery disease Other        CABG  . Coronary artery disease Mother   . Stroke Mother   . Heart disease Mother   . Diabetes Mother   . Other Father   . Diabetes Sister   . Diabetes Maternal Uncle        x2  . Alcohol abuse Other   . Diabetes Paternal Uncle        x2  . Colon cancer Paternal Uncle   . Colon polyps Neg Hx   . Esophageal cancer Neg Hx     PE: BP (!) 150/88 (BP Location: Right Arm, Patient Position: Sitting, Cuff Size: Normal)   Pulse 72   Ht 5\' 10"  (1.778 m)   Wt 248 lb 9.6 oz (112.8 kg)   SpO2 98%   BMI 35.67 kg/m   Wt Readings from Last 3 Encounters:  09/09/20 248 lb 9.6 oz (112.8 kg)  08/17/20 247 lb (112 kg)  06/23/20 253 lb (114.8 kg)   Constitutional: overweight, in NAD Eyes: PERRLA, EOMI, no exophthalmos ENT: moist mucous membranes, no thyromegaly, no cervical lymphadenopathy Cardiovascular: RRR, No MRG Respiratory: CTA B Gastrointestinal: abdomen soft, NT, ND, BS+ Musculoskeletal: no deformities, strength intact in all 4 Skin: moist, warm, no  rashes Neurological: no tremor with outstretched hands, DTR normal in all 4  ASSESSMENT: 1. DM2, insulin-dependent, uncontrolled, with complications - CAD, s/p NSTEMI, s/p stent 11/2014 - ED - mild CKD  2. Obesity class II BMI Classification:  < 18.5 underweight   18.5-24.9 normal weight   25.0-29.9 overweight   30.0-34.9 class I obesity   35.0-39.9 class II obesity   ? 40.0 class III obesity   3. HL  PLAN:  1. Patient with uncontrolled type 2 diabetes, on oral antidiabetic regimen with metformin and sulfonylurea and also weekly GLP-1 receptor agonist.  At last visit, he was also on daily long-acting insulin but apparently he could not refill it after last visit and he is now off insulin..  In the past he had some irritations and GERD so we did not increase the Ozempic dose beyond the lowest dose.  At last visit, sugars were still at goal but they were higher than before.  He was reticent to increase Ozempic.  At that time he was afraid that [place] may hurt his kidneys but we discussed that quite the opposite is true.  However, we ended up continuing the same dose of Ozempic at last visit.  HbA1c was 6.8%, slightly higher. -At today's visit, sugars are excellent, all at goal, despite being off insulin.  He does not have any lows.  For now, there is no need to add back insulin. - I suggested to:  Patient Instructions  Please continue: - Metformin 1000 mg 2x a day - Glipizide XL 5 mg 2x a day - break/crush the evening dose - Ozempic 0.25 mg weekly  Please come back for a follow-up appointment in 4 months.  - we checked his HbA1c: 6.6% (better) - advised to check sugars at different times of the day - 1-2x a day, rotating check times - advised for yearly eye exams >> he is UTD - return to clinic in 4 months  2. Obesity class 2 -Continues Ozempic which should also help with weight loss, however, we are using a lower dose. - lost 8 lbs since last OV, most likely after  stopping insulin  3. HL -Reviewed latest lipid panel from 04/2020: LDL above target, rest of the fractions at goal: Lab Results  Component Value Date   CHOL 207 (H) 04/30/2020   HDL 41 04/30/2020   LDLCALC 148 (H) 04/30/2020   LDLDIRECT 131 (H) 03/22/2019   TRIG 101 04/30/2020   CHOLHDL 5.0 04/30/2020  -Unfortunately, he could not tolerate statins or Repatha due to muscle weakness -He tells me that he is only occasionally taking pravastatin, but has not been taking it lately.  I advised him that even if he takes it every second day or even every third, it can still help.  He will try this.  [name], MD PhD [facility]

## 2020-09-09 NOTE — Patient Instructions (Addendum)
Please continue: - Metformin 1000 mg 2x a day - Glipizide XL 5 mg 2x a day - break/crush the evening dose - Ozempic 0.25 mg weekly  Please come back for a follow-up appointment in 4 months.

## 2020-09-10 ENCOUNTER — Ambulatory Visit (INDEPENDENT_AMBULATORY_CARE_PROVIDER_SITE_OTHER): Payer: Federal, State, Local not specified - PPO | Admitting: Urology

## 2020-09-10 VITALS — BP 132/74 | HR 64 | Ht 70.0 in | Wt 248.0 lb

## 2020-09-10 DIAGNOSIS — R350 Frequency of micturition: Secondary | ICD-10-CM

## 2020-09-10 DIAGNOSIS — C61 Malignant neoplasm of prostate: Secondary | ICD-10-CM

## 2020-09-10 NOTE — Progress Notes (Signed)
09/10/2020 8:03 AM   [name] 1950-08-30 [id number]  Referring provider: Wendie Agreste, MD [address] [place],  [place] [address]  Chief Complaint  Patient presents with   Prostate Cancer    HPI: 70 year old male with a personal history of prostate cancer returns for follow-up care.  He was last seen in our office in 11/2017 at which time he elected to undergo IMRT with Dr. Tammi Klippel.  He completed treatment December 2019 at which time he received 62 Gray of radiation over 5-week course.  Pathology Gleason 3+4, iPSA 5.8  He has been followed by Dr. Alinda Money in the interim.  His most recent PSA was 0.34 on 04/2020.  He does have baseline urinary urgency/frequency.  This was most recently managed with dietary control and behavioral modification.  He is also had better diabetic control in the recent past.  He believes he may have had another PSA in April but is uncertain.  He essentially returns today with the intention of reestablishing care locally with me.     PMH: Past Medical History:  Diagnosis Date   Adenomatous colon polyp 2011   Anemia    Angina pectoris ([place]) 05/01/2015   med rx for 95% OM (u/a to access due to tortuosity), 95% D1 and other moderate CAD at cath   Arthritis    "back, knees" (05/01/2015)   Chest pain    pleuritic   Chronic lower back pain    Complex tear of medial meniscus of left knee 09/14/2018   Coronary artery disease    Depression    GERD (gastroesophageal reflux disease)    HTN (hypertension)    Hyperlipidemia    Obstructive sleep apnea    noncompliant with CPAP   Osteoarthritis    Pneumonia ~ 2014 X 1   Prostate cancer ([place])    Refusal of blood transfusions as patient is Jehovah's Witness    Rotator cuff arthropathy, right    Rupture of right supraspinatus tendon 01/18/2019   Type II or unspecified type diabetes mellitus without mention of complication, not stated as uncontrolled     Surgical History: Past Surgical  History:  Procedure Laterality Date   CARDIAC CATHETERIZATION  2004   patent coronary arteries   CARDIAC CATHETERIZATION  05/01/2015   "tried to put stent in but couldn't"   CARDIAC CATHETERIZATION N/A 05/01/2015   Procedure: Left Heart Cath and Coronary Angiography;  Surgeon: Belva Crome, MD; LAD 60%, oD1 90%, pD1 70%, D2 70%, CFX patent stent, 30% distal to prev stent, pRCA 20%, OM1 90/95%, NL LV   CARDIAC CATHETERIZATION N/A 05/01/2015   Procedure: Coronary Stent Intervention;  Surgeon: Belva Crome, MD;  Unsuccessful PCI OM due to tortuosity   CARDIAC CATHETERIZATION N/A 11/06/2014   Procedure: Left Heart Cath and Coronary Angiography;  Surgeon: Belva Crome, MD;  Location: [facility];  Service: Cardiovascular;  Laterality: N/A;   CLOSED REDUCTION SHOULDER DISLOCATION Right ~ 1975   "& reattached muscle"   COLONOSCOPY W/ BIOPSIES AND POLYPECTOMY  X 2   ESOPHAGOGASTRODUODENOSCOPY ENDOSCOPY     KNEE ARTHROSCOPY WITH MEDIAL MENISECTOMY Left 09/14/2018   Procedure: LEFT KNEE ARTHROSCOPY CHONDROPLASY,  WITH MEDIAL MENISECTOMY;  Surgeon: Marchia Bond, MD;  Location: [facility];  Service: Orthopedics;  Laterality: Left;   PILONIDAL CYST EXCISION     SHOULDER ARTHROSCOPY WITH DEBRIDEMENT AND BICEP TENDON REPAIR Right 01/18/2019   Procedure: RIGHT SHOULDER ARTHROSCOPY WITH DEBRIDEMENT AND DECOMPRSSION SUBACROMIAL PARTIAL  ACROMIOPLASTY;  Surgeon: Marchia Bond, MD;  Location: [facility];  Service: Orthopedics;  Laterality: Right;   SHOULDER ARTHROSCOPY WITH ROTATOR CUFF REPAIR Right 01/18/2019   Procedure: SHOULDER ARTHROSCOPY WITH ROTATOR CUFF REPAIR;  Surgeon: Marchia Bond, MD;  Location: [facility];  Service: Orthopedics;  Laterality: Right;    Home Medications:  Allergies as of 09/10/2020       Reactions   Statins Other (See Comments)   REACTION: joint pain Lipitor- headaches Has also tried Livalo, pravachol, zetia, crestor, welchol    Imdur [isosorbide Dinitrate]    Headache - "violent"        Medication List        Accurate as of September 10, 2020 11:59 PM. If you have any questions, ask your nurse or doctor.          albuterol 108 (90 Base) MCG/ACT inhaler Commonly known as: VENTOLIN HFA INHALE 1-2 PUFFS INTO THE LUNGS EVERY 4 (FOUR) HOURS AS NEEDED FOR WHEEZING OR SHORTNESS OF BREATH.   amLODipine 2.5 MG tablet Commonly known as: NORVASC Take 1 tablet (2.5 mg total) by mouth daily.   aspirin EC 81 MG tablet Take 81 mg by mouth daily. Swallow whole.   fluticasone 50 MCG/ACT nasal spray Commonly known as: FLONASE Place 1-2 sprays into both nostrils daily.   glipiZIDE 5 MG 24 hr tablet Commonly known as: GLUCOTROL XL Take 1 tablet (5 mg total) by mouth 2 (two) times daily.   glucose blood test strip Use as instructed to check sugar 2 times daily   hydrALAZINE 50 MG tablet Commonly known as: APRESOLINE Take 1 tablet (50 mg total) by mouth 3 (three) times daily.   hydrochlorothiazide 25 MG tablet Commonly known as: HYDRODIURIL TAKE 1 TABLET BY MOUTH EVERY DAY   isosorbide mononitrate 60 MG 24 hr tablet Commonly known as: IMDUR isosorbide mononitrate ER 60 mg tablet,extended release 24 hr  TAKE 1 TABLET BY MOUTH EVERY DAY   losartan 100 MG tablet Commonly known as: COZAAR TAKE 1 TABLET BY MOUTH EVERY DAY   metFORMIN 500 MG tablet Commonly known as: GLUCOPHAGE Take 2 tablets (1,000 mg total) by mouth 2 (two) times daily with a meal.   metoprolol tartrate 50 MG tablet Commonly known as: LOPRESSOR TAKE 1 TABLET BY MOUTH TWICE A DAY   Moderna COVID-19 Vaccine 100 MCG/0.5ML injection Generic drug: COVID-19 mRNA vaccine (Moderna) USE AS DIRECTED   nitroGLYCERIN 0.4 MG SL tablet Commonly known as: NITROSTAT PLACE 1 TABLET UNDER THE TONGUE EVERY 5 MIN X 3 DOSES AS NEEDED FOR CHEST PAIN   Ozempic (0.25 or 0.5 MG/DOSE) 2 MG/1.5ML Sopn Generic drug: Semaglutide(0.25 or 0.5MG /DOS) Inject 0.5  mg into the skin once a week.   pravastatin 40 MG tablet Commonly known as: PRAVACHOL Take 80 mg by mouth daily.   [employer] Facilities manager 1 Device by Does not apply route daily.   sertraline 25 MG tablet Commonly known as: ZOLOFT Take 1 tablet (25 mg total) by mouth daily.   spironolactone 25 MG tablet Commonly known as: ALDACTONE spironolactone 25 mg tablet  TAKE 1/2 TABLET BY MOUTH EVERY DAY        Allergies:  Allergies  Allergen Reactions   Statins Other (See Comments)    REACTION: joint pain Lipitor- headaches Has also tried Livalo, pravachol, zetia, crestor, welchol   Imdur [Isosorbide Dinitrate]     Headache - "violent"    Family History: Family History  Problem Relation Age of Onset  Coronary artery disease Other        CABG   Coronary artery disease Mother    Stroke Mother    Heart disease Mother    Diabetes Mother    Other Father    Diabetes Sister    Diabetes Maternal Uncle        x2   Alcohol abuse Other    Diabetes Paternal Uncle        x2   Colon cancer Paternal Uncle    Colon polyps Neg Hx    Esophageal cancer Neg Hx     Social History:  reports that he quit smoking about 37 years ago. His smoking use included cigarettes. He has a 5.00 pack-year smoking history. He has never used smokeless tobacco. He reports current alcohol use. He reports that he does not use drugs.   Physical Exam: BP 132/74   Pulse 64   Ht 5\' 10"  (1.778 m)   Wt 248 lb (112.5 kg)   BMI 35.58 kg/m   Constitutional:  Alert and oriented, No acute distress. HEENT: [place] AT, moist mucus membranes.  Trachea midline, no masses. Cardiovascular: No clubbing, cyanosis, or edema. Respiratory: Normal respiratory effort, no increased work of breathing. Skin: No rashes, bruises or suspicious lesions. Neurologic: Grossly intact, no focal deficits, moving all 4 extremities. Psychiatric: Normal mood and affect.  Laboratory Data: Lab Results  Component Value Date   WBC  6.3 04/09/2019   HGB 14.6 04/09/2019   HCT 44.8 04/09/2019   MCV 87 04/09/2019   PLT 186 04/09/2019    Lab Results  Component Value Date   CREATININE 0.89 04/30/2020     Lab Results  Component Value Date   HGBA1C 6.6 (A) 09/09/2020     Assessment & Plan:    1. Malignant neoplasm of prostate ([place]) Status post XRT with stably low PSA  We will continue to follow in a every 6 month basis - PSA; Future  2. Urinary frequency Improved with dietary behavioral change, will continue to monitor symptoms     Return in about 6 months (around 03/12/2021) for PSA in 6 months.  [name], MD  [facility] [address] [place], [place] [address] [phone]

## 2020-09-11 DIAGNOSIS — G4733 Obstructive sleep apnea (adult) (pediatric): Secondary | ICD-10-CM | POA: Diagnosis not present

## 2020-09-17 ENCOUNTER — Other Ambulatory Visit: Payer: Self-pay | Admitting: Interventional Cardiology

## 2020-09-23 ENCOUNTER — Telehealth: Payer: Self-pay | Admitting: Physical Medicine and Rehabilitation

## 2020-09-23 NOTE — Telephone Encounter (Signed)
Pt called and said he did not want to do the mri Dr. Ernestina Patches suggested, instead wants to get knee looked at first before continuing and wanted to let Dr. Ernestina Patches know if he needed to.

## 2020-09-23 NOTE — Telephone Encounter (Signed)
FYI

## 2020-09-27 ENCOUNTER — Encounter: Payer: Self-pay | Admitting: Physical Medicine and Rehabilitation

## 2020-09-30 ENCOUNTER — Ambulatory Visit (INDEPENDENT_AMBULATORY_CARE_PROVIDER_SITE_OTHER): Payer: Federal, State, Local not specified - PPO | Admitting: Orthopaedic Surgery

## 2020-09-30 ENCOUNTER — Ambulatory Visit: Payer: Self-pay

## 2020-09-30 ENCOUNTER — Encounter: Payer: Self-pay | Admitting: Orthopaedic Surgery

## 2020-09-30 DIAGNOSIS — M1712 Unilateral primary osteoarthritis, left knee: Secondary | ICD-10-CM | POA: Diagnosis not present

## 2020-09-30 DIAGNOSIS — M1711 Unilateral primary osteoarthritis, right knee: Secondary | ICD-10-CM

## 2020-09-30 MED ORDER — TRAMADOL HCL 50 MG PO TABS: 50.0000 mg | ORAL_TABLET | Freq: Three times a day (TID) | ORAL | 2 refills | Status: DC | PRN

## 2020-09-30 NOTE — Progress Notes (Signed)
Office Visit Note   Patient: [name]           Date of Birth: 1951/03/27           MRN: [id number] Visit Date: 09/30/2020              Requested by: Wendie Agreste, MD [id number] [place],  [place] [id number] PCP: Wendie Agreste, MD   Assessment & Plan: Visit Diagnoses:  1. Primary osteoarthritis of left knee   2. Primary osteoarthritis of right knee     Plan: Impression is bilateral knee degenerative joint disease.  We did discuss a trial viscosupplementation injection as well in addition to total knee arthroplasty.  The patient would like to first try viscosupplementation injections.  We will submit for approval.  He will follow-up with [place] once approved.  Have agreed to call in tramadol to take in the meantime.  Call with concerns or questions in the meantime. This patient is diagnosed with osteoarthritis of the knee(s).    Radiographs show evidence of joint space narrowing, osteophytes, subchondral sclerosis and/or subchondral cysts.  This patient has knee pain which interferes with functional and activities of daily living.    This patient has experienced inadequate response, adverse effects and/or intolerance with conservative treatments such as acetaminophen, NSAIDS, topical creams, physical therapy or regular exercise, knee bracing and/or weight loss.   This patient has experienced inadequate response or has a contraindication to intra articular steroid injections for at least 3 months.   This patient is not scheduled to have a total knee replacement within 6 months of starting treatment with viscosupplementation.   Follow-Up Instructions: No follow-ups on file.   Orders:  Orders Placed This Encounter  Procedures   XR KNEE 3 VIEW LEFT   XR KNEE 3 VIEW RIGHT   No orders of the defined types were placed in this encounter.     Procedures: No procedures performed   Clinical Data: No additional findings.   Subjective: Chief Complaint  Patient  presents with   Left Knee - Pain   Right Knee - Pain    HPI patient is a pleasant 70 year old gentleman who comes in today with bilateral knee pain left greater than right.  He has been dealing with this for several years.  His pain is primarily located to the anterior aspect of both knees.  Pain is worse kneeling, walking on uneven grounds or going from a seated to standing position.  He has recently been taking anti-inflammatories without significant relief.  He is status post left knee arthroscopic debridement medial meniscus by Dr. [name] approximately 2 years ago.  He has had cortisone injections in the past without relief lasting longer than a month.  He has not previously undergone viscosupplementation injection.  Review of Systems as detailed in HPI.  All others reviewed and are negative.   Objective: Vital Signs: There were no vitals taken for this visit.  Physical Exam well-developed well-nourished gentleman in no acute distress.  Alert and oriented x3.  Ortho Exam examination of the left knee shows range of motion from 0 to 120 degrees.  Medial joint line tenderness.  Moderate patellofemoral crepitus.  Ligaments are stable.  He is neurovascular intact distally.  Right knee exam shows range of motion from 0 to 120 degrees.  No joint line tenderness.  Moderate patellofemoral crepitus.  Ligaments are stable.  He is neurovascular intact distally.  Specialty Comments:  No specialty comments available.  Imaging:  No results found.   PMFS History: Patient Active Problem List   Diagnosis Date Noted   Statin myopathy 04/04/2019   Statin intolerance -weakness, stiffness 02/13/2019   Rupture of right supraspinatus tendon 01/18/2019   Complex tear of medial meniscus of left knee 09/14/2018   Malignant neoplasm of prostate ([facility]) 01/04/2018   Refusal of blood transfusions as patient is Jehovah's Witness    Pneumonia    Osteoarthritis    Obstructive sleep apnea    Hyperlipidemia     HTN (hypertension)    GERD (gastroesophageal reflux disease)    Depression    Coronary artery disease    Chronic lower back pain    Chest pain    Arthritis    Anemia    Benign essential HTN 01/03/2017   PAT (paroxysmal atrial tachycardia) ([facility]) 10/27/2015   Type 2 diabetes mellitus with circulatory disorder, without long-term current use of insulin ([facility]) 06/19/2015   Angina pectoris ([facility])    Elevated PSA 12/20/2014   CAD (coronary artery disease)    Obstructive sleep apnea 09/06/2014   Personal history of colonic polyps 12/13/2013   Blood in stool 12/13/2013   Prostatitis, acute 12/13/2013   Insomnia 04/19/2013   Obesity with body mass index of 30.0-39.9 10/18/2012   Right lumbar radiculitis 09/29/2011   Adenomatous colon polyp 04/05/2009   ORGANIC IMPOTENCE 11/11/2008   Hyperlipidemia LDL goal <70 08/23/2008   GERD 08/23/2008   BPH (benign prostatic hypertrophy) with urinary obstruction 08/23/2008   OSTEOARTHRITIS 08/23/2008   Past Medical History:  Diagnosis Date   Adenomatous colon polyp 2011   Anemia    Angina pectoris ([facility]) 05/01/2015   med rx for 95% OM (u/a to access due to tortuosity), 95% D1 and other moderate CAD at cath   Arthritis    "back, knees" (05/01/2015)   Chest pain    pleuritic   Chronic lower back pain    Complex tear of medial meniscus of left knee 09/14/2018   Coronary artery disease    Depression    GERD (gastroesophageal reflux disease)    HTN (hypertension)    Hyperlipidemia    Obstructive sleep apnea    noncompliant with CPAP   Osteoarthritis    Pneumonia ~ 2014 X 1   Prostate cancer ([facility])    Refusal of blood transfusions as patient is Jehovah's Witness    Rotator cuff arthropathy, right    Rupture of right supraspinatus tendon 01/18/2019   Type II or unspecified type diabetes mellitus without mention of complication, not stated as uncontrolled     Family History  Problem Relation Age of Onset   Coronary artery disease Other         CABG   Coronary artery disease Mother    Stroke Mother    Heart disease Mother    Diabetes Mother    Other Father    Diabetes Sister    Diabetes Maternal Uncle        x2   Alcohol abuse Other    Diabetes Paternal Uncle        x2   Colon cancer Paternal Uncle    Colon polyps Neg Hx    Esophageal cancer Neg Hx     Past Surgical History:  Procedure Laterality Date   CARDIAC CATHETERIZATION  2004   patent coronary arteries   CARDIAC CATHETERIZATION  05/01/2015   "tried to put stent in but couldn't"   CARDIAC CATHETERIZATION N/A 05/01/2015   Procedure: Left Heart Cath and Coronary Angiography;  Surgeon: Belva Crome, MD; LAD 60%, oD1 90%, pD1 70%, D2 70%, CFX patent stent, 30% distal to prev stent, pRCA 20%, OM1 90/95%, NL LV   CARDIAC CATHETERIZATION N/A 05/01/2015   Procedure: Coronary Stent Intervention;  Surgeon: Belva Crome, MD;  Unsuccessful PCI OM due to tortuosity   CARDIAC CATHETERIZATION N/A 11/06/2014   Procedure: Left Heart Cath and Coronary Angiography;  Surgeon: Belva Crome, MD;  Location: [facility];  Service: Cardiovascular;  Laterality: N/A;   CLOSED REDUCTION SHOULDER DISLOCATION Right ~ 1975   "& reattached muscle"   COLONOSCOPY W/ BIOPSIES AND POLYPECTOMY  X 2   ESOPHAGOGASTRODUODENOSCOPY ENDOSCOPY     KNEE ARTHROSCOPY WITH MEDIAL MENISECTOMY Left 09/14/2018   Procedure: LEFT KNEE ARTHROSCOPY CHONDROPLASY,  WITH MEDIAL MENISECTOMY;  Surgeon: Marchia Bond, MD;  Location: [facility];  Service: Orthopedics;  Laterality: Left;   PILONIDAL CYST EXCISION     SHOULDER ARTHROSCOPY WITH DEBRIDEMENT AND BICEP TENDON REPAIR Right 01/18/2019   Procedure: RIGHT SHOULDER ARTHROSCOPY WITH DEBRIDEMENT AND DECOMPRSSION SUBACROMIAL PARTIAL ACROMIOPLASTY;  Surgeon: Marchia Bond, MD;  Location: [facility];  Service: Orthopedics;  Laterality: Right;   SHOULDER ARTHROSCOPY WITH ROTATOR CUFF REPAIR Right 01/18/2019   Procedure: SHOULDER  ARTHROSCOPY WITH ROTATOR CUFF REPAIR;  Surgeon: Marchia Bond, MD;  Location: [facility];  Service: Orthopedics;  Laterality: Right;   Social History   Occupational History   Occupation: Retired    Fish farm manager: UNEMPLOYED  Tobacco Use   Smoking status: Former    Packs/day: 0.50    Years: 10.00    Pack years: 5.00    Types: Cigarettes    Quit date: 04/06/1983    Years since quitting: 37.5   Smokeless tobacco: Never  Vaping Use   Vaping Use: Never used  Substance and Sexual Activity   Alcohol use: Yes    Alcohol/week: 0.0 standard drinks    Comment: `/26/2017 "might drink a beer q couple months mostly; summertime I might drink 2-3 beers/week"   Drug use: No   Sexual activity: Not Currently

## 2020-10-07 ENCOUNTER — Telehealth: Payer: Self-pay

## 2020-10-07 ENCOUNTER — Other Ambulatory Visit: Payer: Self-pay | Admitting: Interventional Cardiology

## 2020-10-07 NOTE — Telephone Encounter (Signed)
Patient called triage. He states that his pharmacy is requiring a prior authorization on his Tramadol script that was sent in. He first wants to know if [facility] would be willing to fill 3 other medications instead of the Tramadol. He states that he was given colchicine, indomethacin, and baclofen. He feels like those medicines have been working well and would rather have a refill of those instead of the Tramadol. Call back # [phone]

## 2020-10-08 ENCOUNTER — Telehealth: Payer: Self-pay

## 2020-10-08 ENCOUNTER — Other Ambulatory Visit: Payer: Self-pay | Admitting: Physician Assistant

## 2020-10-08 NOTE — Telephone Encounter (Signed)
Tried to call patient. No answer. No voicemail has been sent up.

## 2020-10-08 NOTE — Telephone Encounter (Signed)
Ok.  I just looked as well and don't see.  I do see that he is on medicine for diabetes and hypertension.  I am happy to call in nsaid and muscle relaxer as long as he says that he is allowed to take nsaids due to those above issues

## 2020-10-08 NOTE — Telephone Encounter (Signed)
The first two mentioned are for gout, but I am happy to call in once more.  Can you send all three medications in with same previous instructions that I used prior?

## 2020-10-08 NOTE — Telephone Encounter (Signed)
Patient called he stated he received a missed call from [place], I advised patient of previous message patient is okay with nsaids  and a muslce relaxer to be called in he is requesting the rx to be sent to [id number] - [place], [place] call back:[phone]

## 2020-10-09 ENCOUNTER — Other Ambulatory Visit: Payer: Self-pay | Admitting: Physician Assistant

## 2020-10-09 MED ORDER — MELOXICAM 7.5 MG PO TABS: 7.5000 mg | ORAL_TABLET | Freq: Every day | ORAL | 2 refills | Status: DC

## 2020-10-09 MED ORDER — METHOCARBAMOL 500 MG PO TABS: 500.0000 mg | ORAL_TABLET | Freq: Two times a day (BID) | ORAL | 0 refills | Status: DC | PRN

## 2020-10-09 NOTE — Telephone Encounter (Signed)
Sent to [facility] 

## 2020-10-09 NOTE — Telephone Encounter (Signed)
I called patient and advised. 

## 2020-10-09 NOTE — Telephone Encounter (Signed)
Sent to pharmacy. I called patient and advised. 

## 2020-10-09 NOTE — Telephone Encounter (Signed)
Sent in

## 2020-10-13 ENCOUNTER — Telehealth: Payer: Self-pay

## 2020-10-13 NOTE — Telephone Encounter (Signed)
Noted  

## 2020-10-13 NOTE — Telephone Encounter (Signed)
Please submit for bil knee gel inj's.

## 2020-10-13 NOTE — Telephone Encounter (Signed)
VOB submitted for SynviscOne, bilateral knee. Pending BV. 

## 2020-10-17 ENCOUNTER — Telehealth: Payer: Self-pay

## 2020-10-17 NOTE — Telephone Encounter (Signed)
Called and left a VM on home phone advising patient to CB to schedule with Dr. Erlinda Hong for gel injection. Called cell phone as well, but no answer and no VM setup to leave a message.  Approved for SynviscOne, bilateral knee. [place] Patient may be responsible for 20% OOP. Co-pay of $25.00 No PA required

## 2020-10-21 NOTE — Progress Notes (Deleted)
Cardiology Office Note:    Date:  10/21/2020   ID:  [name], DOB 09-16-1950, MRN [id number]  PCP:  Ronald Agreste, MD  Cardiologist:  Ronald Grooms, MD   Referring MD: Ronald Agreste, MD   No chief complaint on file.   History of Present Illness:    [name] is a 70 y.o. male with a hx of CAD s/p DES to LCx 11/2014, OM1 not stented due to inability of stent to track (likely due to wire tangle), HTN, HLD, OSA not on CPAP and PAT/PAF (not on anticoagulation due to low burden).   ***  Past Medical History:  Diagnosis Date   Adenomatous colon polyp 2011   Anemia    Angina pectoris ([facility]) 05/01/2015   med rx for 95% OM (u/a to access due to tortuosity), 95% D1 and other moderate CAD at cath   Arthritis    "back, knees" (05/01/2015)   Chest pain    pleuritic   Chronic lower back pain    Complex tear of medial meniscus of left knee 09/14/2018   Coronary artery disease    Depression    GERD (gastroesophageal reflux disease)    HTN (hypertension)    Hyperlipidemia    Obstructive sleep apnea    noncompliant with CPAP   Osteoarthritis    Pneumonia ~ 2014 X 1   Prostate cancer ([facility])    Refusal of blood transfusions as patient is Jehovah's Witness    Rotator cuff arthropathy, right    Rupture of right supraspinatus tendon 01/18/2019   Type II or unspecified type diabetes mellitus without mention of complication, not stated as uncontrolled     Past Surgical History:  Procedure Laterality Date   CARDIAC CATHETERIZATION  2004   patent coronary arteries   CARDIAC CATHETERIZATION  05/01/2015   "tried to put stent in but couldn't"   CARDIAC CATHETERIZATION N/A 05/01/2015   Procedure: Left Heart Cath and Coronary Angiography;  Surgeon: Belva Crome, MD; LAD 60%, oD1 90%, pD1 70%, D2 70%, CFX patent stent, 30% distal to prev stent, pRCA 20%, OM1 90/95%, NL LV   CARDIAC CATHETERIZATION N/A 05/01/2015   Procedure: Coronary Stent Intervention;  Surgeon: Belva Crome, MD;  Unsuccessful PCI OM due to tortuosity   CARDIAC CATHETERIZATION N/A 11/06/2014   Procedure: Left Heart Cath and Coronary Angiography;  Surgeon: Belva Crome, MD;  Location: [facility];  Service: Cardiovascular;  Laterality: N/A;   CLOSED REDUCTION SHOULDER DISLOCATION Right ~ 1975   "& reattached muscle"   COLONOSCOPY W/ BIOPSIES AND POLYPECTOMY  X 2   ESOPHAGOGASTRODUODENOSCOPY ENDOSCOPY     KNEE ARTHROSCOPY WITH MEDIAL MENISECTOMY Left 09/14/2018   Procedure: LEFT KNEE ARTHROSCOPY CHONDROPLASY,  WITH MEDIAL MENISECTOMY;  Surgeon: Marchia Bond, MD;  Location: [facility];  Service: Orthopedics;  Laterality: Left;   PILONIDAL CYST EXCISION     SHOULDER ARTHROSCOPY WITH DEBRIDEMENT AND BICEP TENDON REPAIR Right 01/18/2019   Procedure: RIGHT SHOULDER ARTHROSCOPY WITH DEBRIDEMENT AND DECOMPRSSION SUBACROMIAL PARTIAL ACROMIOPLASTY;  Surgeon: Marchia Bond, MD;  Location: [facility];  Service: Orthopedics;  Laterality: Right;   SHOULDER ARTHROSCOPY WITH ROTATOR CUFF REPAIR Right 01/18/2019   Procedure: SHOULDER ARTHROSCOPY WITH ROTATOR CUFF REPAIR;  Surgeon: Marchia Bond, MD;  Location: [facility];  Service: Orthopedics;  Laterality: Right;    Current Medications: No outpatient medications have been marked as taking for the 10/22/20 encounter (Appointment) with Belva Crome,  MD.     Allergies:   Statins and Imdur [isosorbide dinitrate]   Social History   Socioeconomic History   Marital status: Married    Spouse name: [name]   Number of children: 4   Years of education: Not on file   Highest education level: High school graduate  Occupational History   Occupation: Retired    Fish farm manager: UNEMPLOYED  Tobacco Use   Smoking status: Former    Packs/day: 0.50    Years: 10.00    Pack years: 5.00    Types: Cigarettes    Quit date: 04/06/1983    Years since quitting: 37.5   Smokeless tobacco: Never  Vaping Use   Vaping Use:  Never used  Substance and Sexual Activity   Alcohol use: Yes    Alcohol/week: 0.0 standard drinks    Comment: `/26/2017 "might drink a beer q couple months mostly; summertime I might drink 2-3 beers/week"   Drug use: No   Sexual activity: Not Currently  Other Topics Concern   Not on file  Social History Narrative   Lives in [place] with spouse [name]).  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   Social Determinants of Health   Financial Resource Strain: Not on file  Food Insecurity: Not on file  Transportation Needs: Not on file  Physical Activity: Not on file  Stress: Not on file  Social Connections: Not on file     Family History: The patient's family history includes Alcohol abuse in an other family member; Colon cancer in his paternal uncle; Coronary artery disease in his mother and another family member; Diabetes in his maternal uncle, mother, paternal uncle, and sister; Heart disease in his mother; Other in his father; Stroke in his mother. There is no history of Colon polyps or Esophageal cancer.  ROS:   Please see the history of present illness.    *** All other systems reviewed and are negative.  EKGs/Labs/Other Studies Reviewed:    The following studies were reviewed today: ***  EKG:  EKG ***  Recent Labs: 04/30/2020: ALT 16; BUN 10; Creatinine, Ser 0.89; Potassium 4.4; Sodium 145  Recent Lipid Panel    Component Value Date/Time   CHOL 207 (H) 04/30/2020 1624   TRIG 101 04/30/2020 1624   HDL 41 04/30/2020 1624   CHOLHDL 5.0 04/30/2020 1624   CHOLHDL 4 06/19/2015 1624   VLDL 12.2 06/19/2015 1624   LDLCALC 148 (H) 04/30/2020 1624   LDLDIRECT 131 (H) 03/22/2019 1427   LDLDIRECT 168.0 12/18/2012 1340    Physical Exam:    VS:  There were no vitals taken for this visit.    Wt Readings from Last 3 Encounters:  09/10/20 248 lb (112.5 kg)  09/09/20 248 lb 9.6 oz (112.8 kg)  08/17/20 247 lb (112 kg)      GEN: ***. No acute distress HEENT: Normal NECK: No JVD. LYMPHATICS: No lymphadenopathy CARDIAC: *** murmur. RRR *** gallop, or edema. VASCULAR: *** Normal Pulses. No bruits. RESPIRATORY:  Clear to auscultation without rales, wheezing or rhonchi  ABDOMEN: Soft, non-tender, non-distended, No pulsatile mass, MUSCULOSKELETAL: No deformity  SKIN: Warm and dry NEUROLOGIC:  Alert and oriented x 3 PSYCHIATRIC:  Normal affect   ASSESSMENT:    1. Coronary artery disease involving native coronary artery of native heart with angina pectoris ([facility])   2. Hyperlipidemia LDL goal <70   3. Benign essential HTN   4. Mixed hyperlipidemia  5. Type 2 diabetes mellitus with other circulatory complication, without long-term current use of insulin ([facility])   6. PAT (paroxysmal atrial tachycardia) ([facility])    PLAN:    In order of problems listed above:  ***   Medication Adjustments/Labs and Tests Ordered: Current medicines are reviewed at length with the patient today.  Concerns regarding medicines are outlined above.  No orders of the defined types were placed in this encounter.  No orders of the defined types were placed in this encounter.   There are no Patient Instructions on file for this visit.   Signed, Ronald Grooms, MD  10/21/2020 8:37 PM    [place]

## 2020-10-22 ENCOUNTER — Ambulatory Visit: Payer: Federal, State, Local not specified - PPO | Admitting: Interventional Cardiology

## 2020-10-22 DIAGNOSIS — E782 Mixed hyperlipidemia: Secondary | ICD-10-CM

## 2020-10-22 DIAGNOSIS — I471 Supraventricular tachycardia: Secondary | ICD-10-CM

## 2020-10-22 DIAGNOSIS — I10 Essential (primary) hypertension: Secondary | ICD-10-CM

## 2020-10-22 DIAGNOSIS — I25119 Atherosclerotic heart disease of native coronary artery with unspecified angina pectoris: Secondary | ICD-10-CM

## 2020-10-22 DIAGNOSIS — E785 Hyperlipidemia, unspecified: Secondary | ICD-10-CM

## 2020-10-22 DIAGNOSIS — E1159 Type 2 diabetes mellitus with other circulatory complications: Secondary | ICD-10-CM

## 2020-10-28 ENCOUNTER — Other Ambulatory Visit: Payer: Self-pay

## 2020-10-28 ENCOUNTER — Other Ambulatory Visit: Payer: Federal, State, Local not specified - PPO

## 2020-10-28 DIAGNOSIS — C61 Malignant neoplasm of prostate: Secondary | ICD-10-CM | POA: Diagnosis not present

## 2020-10-29 ENCOUNTER — Encounter: Payer: Self-pay | Admitting: *Deleted

## 2020-10-29 LAB — PSA: Prostate Specific Ag, Serum: 0.5 ng/mL (ref 0.0–4.0)

## 2020-11-04 ENCOUNTER — Other Ambulatory Visit: Payer: Self-pay

## 2020-11-04 ENCOUNTER — Encounter: Payer: Self-pay | Admitting: Orthopaedic Surgery

## 2020-11-04 ENCOUNTER — Ambulatory Visit (INDEPENDENT_AMBULATORY_CARE_PROVIDER_SITE_OTHER): Payer: Federal, State, Local not specified - PPO | Admitting: Orthopaedic Surgery

## 2020-11-04 DIAGNOSIS — M1712 Unilateral primary osteoarthritis, left knee: Secondary | ICD-10-CM

## 2020-11-04 DIAGNOSIS — M170 Bilateral primary osteoarthritis of knee: Secondary | ICD-10-CM

## 2020-11-04 DIAGNOSIS — M1711 Unilateral primary osteoarthritis, right knee: Secondary | ICD-10-CM

## 2020-11-04 MED ORDER — HYLAN G-F 20 48 MG/6ML IX SOSY
48.0000 mg | PREFILLED_SYRINGE | INTRA_ARTICULAR | Status: AC | PRN
  Administered 2020-11-04: 48 mg via INTRA_ARTICULAR

## 2020-11-04 MED ORDER — LIDOCAINE HCL 1 % IJ SOLN
2.0000 mL | INTRAMUSCULAR | Status: AC | PRN
  Administered 2020-11-04: 2 mL

## 2020-11-04 MED ORDER — BUPIVACAINE HCL 0.5 % IJ SOLN
2.0000 mL | INTRAMUSCULAR | Status: AC | PRN
  Administered 2020-11-04: 2 mL via INTRA_ARTICULAR

## 2020-11-04 NOTE — Progress Notes (Signed)
Office Visit Note   Patient: [name]           Date of Birth: 1950/06/15           MRN: [id number] Visit Date: 11/04/2020              Requested by: Wendie Agreste, MD [address] [place],  [place] [address] PCP: Wendie Agreste, MD   Assessment & Plan: Visit Diagnoses:  1. Bilateral primary osteoarthritis of knee     Plan: he underwent bilateral synvisc injections today and tolerated them well . Follow up as needed  Follow-Up Instructions: Return if symptoms worsen or fail to improve.   Orders:  No orders of the defined types were placed in this encounter.  No orders of the defined types were placed in this encounter.     Procedures: Large Joint Inj: bilateral knee on 11/04/2020 2:30 PM Indications: pain Details: 22 G needle  Arthrogram: No  Medications (Right): 2 mL lidocaine 1 %; 2 mL bupivacaine 0.5 %; 48 mg Hylan 48 MG/6ML Medications (Left): 2 mL lidocaine 1 %; 2 mL bupivacaine 0.5 %; 48 mg Hylan 48 MG/6ML Outcome: tolerated well, no immediate complications Patient was prepped and draped in the usual sterile fashion.      Clinical Data: No additional findings.   Subjective: Chief Complaint  Patient presents with   Right Knee - Follow-up   Left Knee - Follow-up    Patient here for bilateral synvisc injections.   Review of Systems   Objective: Vital Signs: There were no vitals taken for this visit.  Physical Exam  Ortho Exam  Specialty Comments:  No specialty comments available.  Imaging: No results found.   PMFS History: Patient Active Problem List   Diagnosis Date Noted   Statin myopathy 04/04/2019   Statin intolerance -weakness, stiffness 02/13/2019   Rupture of right supraspinatus tendon 01/18/2019   Complex tear of medial meniscus of left knee 09/14/2018   Malignant neoplasm of prostate ([place]) 01/04/2018   Refusal of blood transfusions as patient is Jehovah's Witness    Pneumonia    Osteoarthritis     Obstructive sleep apnea    Hyperlipidemia    HTN (hypertension)    GERD (gastroesophageal reflux disease)    Depression    Coronary artery disease    Chronic lower back pain    Chest pain    Arthritis    Anemia    Benign essential HTN 01/03/2017   PAT (paroxysmal atrial tachycardia) ([facility]) 10/27/2015   Type 2 diabetes mellitus with circulatory disorder, without long-term current use of insulin ([place]) 06/19/2015   Angina pectoris ([place])    Elevated PSA 12/20/2014   CAD (coronary artery disease)    Obstructive sleep apnea 09/06/2014   Personal history of colonic polyps 12/13/2013   Blood in stool 12/13/2013   Prostatitis, acute 12/13/2013   Insomnia 04/19/2013   Obesity with body mass index of 30.0-39.9 10/18/2012   Right lumbar radiculitis 09/29/2011   Adenomatous colon polyp 04/05/2009   ORGANIC IMPOTENCE 11/11/2008   Hyperlipidemia LDL goal <70 08/23/2008   GERD 08/23/2008   BPH (benign prostatic hypertrophy) with urinary obstruction 08/23/2008   OSTEOARTHRITIS 08/23/2008   Past Medical History:  Diagnosis Date   Adenomatous colon polyp 2011   Anemia    Angina pectoris ([facility]) 05/01/2015   med rx for 95% OM (u/a to access due to tortuosity), 95% D1 and other moderate CAD at cath   Arthritis    "  back, knees" (05/01/2015)   Chest pain    pleuritic   Chronic lower back pain    Complex tear of medial meniscus of left knee 09/14/2018   Coronary artery disease    Depression    GERD (gastroesophageal reflux disease)    HTN (hypertension)    Hyperlipidemia    Obstructive sleep apnea    noncompliant with CPAP   Osteoarthritis    Pneumonia ~ 2014 X 1   Prostate cancer ([facility])    Refusal of blood transfusions as patient is Jehovah's Witness    Rotator cuff arthropathy, right    Rupture of right supraspinatus tendon 01/18/2019   Type II or unspecified type diabetes mellitus without mention of complication, not stated as uncontrolled     Family History  Problem Relation Age of  Onset   Coronary artery disease Other        CABG   Coronary artery disease Mother    Stroke Mother    Heart disease Mother    Diabetes Mother    Other Father    Diabetes Sister    Diabetes Maternal Uncle        x2   Alcohol abuse Other    Diabetes Paternal Uncle        x2   Colon cancer Paternal Uncle    Colon polyps Neg Hx    Esophageal cancer Neg Hx     Past Surgical History:  Procedure Laterality Date   CARDIAC CATHETERIZATION  2004   patent coronary arteries   CARDIAC CATHETERIZATION  05/01/2015   "tried to put stent in but couldn't"   CARDIAC CATHETERIZATION N/A 05/01/2015   Procedure: Left Heart Cath and Coronary Angiography;  Surgeon: Belva Crome, MD; LAD 60%, oD1 90%, pD1 70%, D2 70%, CFX patent stent, 30% distal to prev stent, pRCA 20%, OM1 90/95%, NL LV   CARDIAC CATHETERIZATION N/A 05/01/2015   Procedure: Coronary Stent Intervention;  Surgeon: Belva Crome, MD;  Unsuccessful PCI OM due to tortuosity   CARDIAC CATHETERIZATION N/A 11/06/2014   Procedure: Left Heart Cath and Coronary Angiography;  Surgeon: Belva Crome, MD;  Location: [facility];  Service: Cardiovascular;  Laterality: N/A;   CLOSED REDUCTION SHOULDER DISLOCATION Right ~ 1975   "& reattached muscle"   COLONOSCOPY W/ BIOPSIES AND POLYPECTOMY  X 2   ESOPHAGOGASTRODUODENOSCOPY ENDOSCOPY     KNEE ARTHROSCOPY WITH MEDIAL MENISECTOMY Left 09/14/2018   Procedure: LEFT KNEE ARTHROSCOPY CHONDROPLASY,  WITH MEDIAL MENISECTOMY;  Surgeon: Marchia Bond, MD;  Location: [facility];  Service: Orthopedics;  Laterality: Left;   PILONIDAL CYST EXCISION     SHOULDER ARTHROSCOPY WITH DEBRIDEMENT AND BICEP TENDON REPAIR Right 01/18/2019   Procedure: RIGHT SHOULDER ARTHROSCOPY WITH DEBRIDEMENT AND DECOMPRSSION SUBACROMIAL PARTIAL ACROMIOPLASTY;  Surgeon: Marchia Bond, MD;  Location: [facility];  Service: Orthopedics;  Laterality: Right;   SHOULDER ARTHROSCOPY WITH ROTATOR CUFF REPAIR  Right 01/18/2019   Procedure: SHOULDER ARTHROSCOPY WITH ROTATOR CUFF REPAIR;  Surgeon: Marchia Bond, MD;  Location: [facility];  Service: Orthopedics;  Laterality: Right;   Social History   Occupational History   Occupation: Retired    Fish farm manager: UNEMPLOYED  Tobacco Use   Smoking status: Former    Packs/day: 0.50    Years: 10.00    Pack years: 5.00    Types: Cigarettes    Quit date: 04/06/1983    Years since quitting: 37.6   Smokeless tobacco: Never  Vaping Use   Vaping  Use: Never used  Substance and Sexual Activity   Alcohol use: Yes    Alcohol/week: 0.0 standard drinks    Comment: `/26/2017 "might drink a beer q couple months mostly; summertime I might drink 2-3 beers/week"   Drug use: No   Sexual activity: Not Currently

## 2020-11-04 NOTE — Progress Notes (Deleted)
Office Visit Note   Patient: [name]           Date of Birth: May 18, 1950           MRN: [id number] Visit Date: 11/04/2020              Requested by: Wendie Agreste, MD [address] [place],  [place] [address] PCP: Wendie Agreste, MD   Assessment & Plan: Visit Diagnoses:  1. Bilateral primary osteoarthritis of knee     Plan: ***  Follow-Up Instructions: Return if symptoms worsen or fail to improve.   Orders:  Orders Placed This Encounter  Procedures   Large Joint Inj    No orders of the defined types were placed in this encounter.     Procedures: Large Joint Inj: bilateral knee on 11/04/2020 2:10 PM Indications: pain Details: 22 G needle, anterolateral approach Medications (Right): 0.66 mL bupivacaine 0.25 %; 3 mL lidocaine 1 %; 48 mg Hylan 48 MG/6ML Medications (Left): 0.66 mL bupivacaine 0.25 %; 3 mL lidocaine 1 %; 48 mg Hylan 48 MG/6ML     Clinical Data: No additional findings.   Subjective: No chief complaint on file.   HPI  Review of Systems   Objective: Vital Signs: There were no vitals taken for this visit.  Physical Exam  Ortho Exam  Specialty Comments:  No specialty comments available.  Imaging: No results found.   PMFS History: Patient Active Problem List   Diagnosis Date Noted   Statin myopathy 04/04/2019   Statin intolerance -weakness, stiffness 02/13/2019   Rupture of right supraspinatus tendon 01/18/2019   Complex tear of medial meniscus of left knee 09/14/2018   Malignant neoplasm of prostate ([place]) 01/04/2018   Refusal of blood transfusions as patient is Jehovah's Witness    Pneumonia    Osteoarthritis    Obstructive sleep apnea    Hyperlipidemia    HTN (hypertension)    GERD (gastroesophageal reflux disease)    Depression    Coronary artery disease    Chronic lower back pain    Chest pain    Arthritis    Anemia    Benign essential HTN 01/03/2017   PAT (paroxysmal atrial tachycardia) ([place])  10/27/2015   Type 2 diabetes mellitus with circulatory disorder, without long-term current use of insulin ([place]) 06/19/2015   Angina pectoris ([place])    Elevated PSA 12/20/2014   CAD (coronary artery disease)    Obstructive sleep apnea 09/06/2014   Personal history of colonic polyps 12/13/2013   Blood in stool 12/13/2013   Prostatitis, acute 12/13/2013   Insomnia 04/19/2013   Obesity with body mass index of 30.0-39.9 10/18/2012   Right lumbar radiculitis 09/29/2011   Adenomatous colon polyp 04/05/2009   ORGANIC IMPOTENCE 11/11/2008   Hyperlipidemia LDL goal <70 08/23/2008   GERD 08/23/2008   BPH (benign prostatic hypertrophy) with urinary obstruction 08/23/2008   OSTEOARTHRITIS 08/23/2008   Past Medical History:  Diagnosis Date   Adenomatous colon polyp 2011   Anemia    Angina pectoris ([facility]) 05/01/2015   med rx for 95% OM (u/a to access due to tortuosity), 95% D1 and other moderate CAD at cath   Arthritis    "back, knees" (05/01/2015)   Chest pain    pleuritic   Chronic lower back pain    Complex tear of medial meniscus of left knee 09/14/2018   Coronary artery disease    Depression    GERD (gastroesophageal reflux disease)    HTN (  hypertension)    Hyperlipidemia    Obstructive sleep apnea    noncompliant with CPAP   Osteoarthritis    Pneumonia ~ 2014 X 1   Prostate cancer ([facility])    Refusal of blood transfusions as patient is Jehovah's Witness    Rotator cuff arthropathy, right    Rupture of right supraspinatus tendon 01/18/2019   Type II or unspecified type diabetes mellitus without mention of complication, not stated as uncontrolled     Family History  Problem Relation Age of Onset   Coronary artery disease Other        CABG   Coronary artery disease Mother    Stroke Mother    Heart disease Mother    Diabetes Mother    Other Father    Diabetes Sister    Diabetes Maternal Uncle        x2   Alcohol abuse Other    Diabetes Paternal Uncle        x2   Colon cancer  Paternal Uncle    Colon polyps Neg Hx    Esophageal cancer Neg Hx     Past Surgical History:  Procedure Laterality Date   CARDIAC CATHETERIZATION  2004   patent coronary arteries   CARDIAC CATHETERIZATION  05/01/2015   "tried to put stent in but couldn't"   CARDIAC CATHETERIZATION N/A 05/01/2015   Procedure: Left Heart Cath and Coronary Angiography;  Surgeon: Belva Crome, MD; LAD 60%, oD1 90%, pD1 70%, D2 70%, CFX patent stent, 30% distal to prev stent, pRCA 20%, OM1 90/95%, NL LV   CARDIAC CATHETERIZATION N/A 05/01/2015   Procedure: Coronary Stent Intervention;  Surgeon: Belva Crome, MD;  Unsuccessful PCI OM due to tortuosity   CARDIAC CATHETERIZATION N/A 11/06/2014   Procedure: Left Heart Cath and Coronary Angiography;  Surgeon: Belva Crome, MD;  Location: [facility];  Service: Cardiovascular;  Laterality: N/A;   CLOSED REDUCTION SHOULDER DISLOCATION Right ~ 1975   "& reattached muscle"   COLONOSCOPY W/ BIOPSIES AND POLYPECTOMY  X 2   ESOPHAGOGASTRODUODENOSCOPY ENDOSCOPY     KNEE ARTHROSCOPY WITH MEDIAL MENISECTOMY Left 09/14/2018   Procedure: LEFT KNEE ARTHROSCOPY CHONDROPLASY,  WITH MEDIAL MENISECTOMY;  Surgeon: Marchia Bond, MD;  Location: [facility];  Service: Orthopedics;  Laterality: Left;   PILONIDAL CYST EXCISION     SHOULDER ARTHROSCOPY WITH DEBRIDEMENT AND BICEP TENDON REPAIR Right 01/18/2019   Procedure: RIGHT SHOULDER ARTHROSCOPY WITH DEBRIDEMENT AND DECOMPRSSION SUBACROMIAL PARTIAL ACROMIOPLASTY;  Surgeon: Marchia Bond, MD;  Location: [facility];  Service: Orthopedics;  Laterality: Right;   SHOULDER ARTHROSCOPY WITH ROTATOR CUFF REPAIR Right 01/18/2019   Procedure: SHOULDER ARTHROSCOPY WITH ROTATOR CUFF REPAIR;  Surgeon: Marchia Bond, MD;  Location: [facility];  Service: Orthopedics;  Laterality: Right;   Social History   Occupational History   Occupation: Retired    Fish farm manager: UNEMPLOYED  Tobacco Use   Smoking  status: Former    Packs/day: 0.50    Years: 10.00    Pack years: 5.00    Types: Cigarettes    Quit date: 04/06/1983    Years since quitting: 37.6   Smokeless tobacco: Never  Vaping Use   Vaping Use: Never used  Substance and Sexual Activity   Alcohol use: Yes    Alcohol/week: 0.0 standard drinks    Comment: `/26/2017 "might drink a beer q couple months mostly; summertime I might drink 2-3 beers/week"   Drug use: No   Sexual activity: Not  Currently

## 2020-11-05 ENCOUNTER — Other Ambulatory Visit: Payer: Self-pay | Admitting: Physician Assistant

## 2020-11-21 ENCOUNTER — Other Ambulatory Visit: Payer: Self-pay

## 2020-11-21 DIAGNOSIS — M7989 Other specified soft tissue disorders: Secondary | ICD-10-CM

## 2020-12-01 NOTE — Progress Notes (Signed)
[facility]  ASSESSMENT / PLAN: [name] is a 70 y.o. male with venous insufficiency of bilateral lower extremities.  He has venous ulceration of his right lower extremity (C6 disease).  He has edema of his left lower extremity (C3 disease).  No evidence of superficial vein reflux.  We will initiate multilayer compression therapy of his right lower extremity today.  He needs to be compliant with compression therapy of his left lower extremity.  We will continue weekly Unna boot changes to the right lower extremity until he is healed.   CHIEF COMPLAINT: leg swelling, venous disease  HISTORY OF PRESENT ILLNESS: [name] is a 70 y.o. male clinic for evaluation of bilateral lower extremity swelling.  The right leg is more affected than the left.  He reports he presented to the ER recently for evaluation of spontaneous bleeding from a varicosity at his right medial ankle.  This resolved with pressure.  He is developed 2 areas of blistering and ulceration over the pretibial skin of the right lower extremity.  Some days are worse than others.  The swelling.  He has had good but temporary response from Lasix.  Past Medical History:  Diagnosis Date   Adenomatous colon polyp 2011   Anemia    Angina pectoris ([place]) 05/01/2015   med rx for 95% OM (u/a to access due to tortuosity), 95% D1 and other moderate CAD at cath   Arthritis    "back, knees" (05/01/2015)   Chest pain    pleuritic   Chronic lower back pain    Complex tear of medial meniscus of left knee 09/14/2018   Coronary artery disease    Depression    GERD (gastroesophageal reflux disease)    HTN (hypertension)    Hyperlipidemia    Obstructive sleep apnea    noncompliant with CPAP   Osteoarthritis    Pneumonia ~ 2014 X 1   Prostate cancer ([place])    Refusal of blood transfusions as patient is Jehovah's Witness    Rotator cuff arthropathy, right    Rupture of right supraspinatus tendon 01/18/2019    Type II or unspecified type diabetes mellitus without mention of complication, not stated as uncontrolled     Past Surgical History:  Procedure Laterality Date   CARDIAC CATHETERIZATION  2004   patent coronary arteries   CARDIAC CATHETERIZATION  05/01/2015   "tried to put stent in but couldn't"   CARDIAC CATHETERIZATION N/A 05/01/2015   Procedure: Left Heart Cath and Coronary Angiography;  Surgeon: Belva Crome, MD; LAD 60%, oD1 90%, pD1 70%, D2 70%, CFX patent stent, 30% distal to prev stent, pRCA 20%, OM1 90/95%, NL LV   CARDIAC CATHETERIZATION N/A 05/01/2015   Procedure: Coronary Stent Intervention;  Surgeon: Belva Crome, MD;  Unsuccessful PCI OM due to tortuosity   CARDIAC CATHETERIZATION N/A 11/06/2014   Procedure: Left Heart Cath and Coronary Angiography;  Surgeon: Belva Crome, MD;  Location: [facility];  Service: Cardiovascular;  Laterality: N/A;   CLOSED REDUCTION SHOULDER DISLOCATION Right ~ 1975   "& reattached muscle"   COLONOSCOPY W/ BIOPSIES AND POLYPECTOMY  X 2   ESOPHAGOGASTRODUODENOSCOPY ENDOSCOPY     KNEE ARTHROSCOPY WITH MEDIAL MENISECTOMY Left 09/14/2018   Procedure: LEFT KNEE ARTHROSCOPY CHONDROPLASY,  WITH MEDIAL MENISECTOMY;  Surgeon: Marchia Bond, MD;  Location: [facility];  Service: Orthopedics;  Laterality: Left;   PILONIDAL CYST EXCISION     SHOULDER ARTHROSCOPY WITH DEBRIDEMENT  AND BICEP TENDON REPAIR Right 01/18/2019   Procedure: RIGHT SHOULDER ARTHROSCOPY WITH DEBRIDEMENT AND DECOMPRSSION SUBACROMIAL PARTIAL ACROMIOPLASTY;  Surgeon: Marchia Bond, MD;  Location: [place];  Service: Orthopedics;  Laterality: Right;   SHOULDER ARTHROSCOPY WITH ROTATOR CUFF REPAIR Right 01/18/2019   Procedure: SHOULDER ARTHROSCOPY WITH ROTATOR CUFF REPAIR;  Surgeon: Marchia Bond, MD;  Location: [place];  Service: Orthopedics;  Laterality: Right;    Family History  Problem Relation Age of Onset   Coronary artery  disease Other        CABG   Coronary artery disease Mother    Stroke Mother    Heart disease Mother    Diabetes Mother    Other Father    Diabetes Sister    Diabetes Maternal Uncle        x2   Alcohol abuse Other    Diabetes Paternal Uncle        x2   Colon cancer Paternal Uncle    Colon polyps Neg Hx    Esophageal cancer Neg Hx     Social History   Socioeconomic History   Marital status: Married    Spouse name: [name]   Number of children: 4   Years of education: Not on file   Highest education level: High school graduate  Occupational History   Occupation: Retired    Fish farm manager: UNEMPLOYED  Tobacco Use   Smoking status: Former    Packs/day: 0.50    Years: 10.00    Pack years: 5.00    Types: Cigarettes    Quit date: 04/06/1983    Years since quitting: 37.6   Smokeless tobacco: Never  Vaping Use   Vaping Use: Never used  Substance and Sexual Activity   Alcohol use: Yes    Alcohol/week: 0.0 standard drinks    Comment: `/26/2017 "might drink a beer q couple months mostly; summertime I might drink 2-3 beers/week"   Drug use: No   Sexual activity: Not Currently  Other Topics Concern   Not on file  Social History Narrative   Lives in [place] with spouse Jamarrie Angello).  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   Social Determinants of Health   Financial Resource Strain: Not on file  Food Insecurity: Not on file  Transportation Needs: Not on file  Physical Activity: Not on file  Stress: Not on file  Social Connections: Not on file  Intimate Partner Violence: Not on file    Allergies  Allergen Reactions   Statins Other (See Comments)    REACTION: joint pain Lipitor- headaches Has also tried Livalo, pravachol, zetia, crestor, welchol   Imdur [Isosorbide Dinitrate]     Headache - "violent"    Current Outpatient Medications  Medication Sig Dispense Refill   albuterol (VENTOLIN HFA) 108 (90 Base) MCG/ACT  inhaler INHALE 1-2 PUFFS INTO THE LUNGS EVERY 4 (FOUR) HOURS AS NEEDED FOR WHEEZING OR SHORTNESS OF BREATH. 18 each 2   aspirin EC 81 MG tablet Take 81 mg by mouth daily. Swallow whole.     Blood Glucose Monitoring Suppl (RELION PREMIER BLU MONITOR) DEVI 1 Device by Does not apply route daily. 1 Device 0   fluticasone (FLONASE) 50 MCG/ACT nasal spray Place 1-2 sprays into both nostrils daily. 48 mL 2   glipiZIDE (GLUCOTROL XL) 5 MG 24 hr tablet Take 1 tablet (5 mg total) by mouth 2 (two) times daily. 180 tablet 3   glucose  blood test strip Use as instructed to check sugar 2 times daily 200 each 5   hydrALAZINE (APRESOLINE) 50 MG tablet Take 1 tablet (50 mg total) by mouth 3 (three) times daily. 270 tablet 3   hydrochlorothiazide (HYDRODIURIL) 25 MG tablet TAKE 1 TABLET BY MOUTH EVERY DAY 90 tablet 3   losartan (COZAAR) 100 MG tablet TAKE 1 TABLET BY MOUTH EVERY DAY 90 tablet 3   meloxicam (MOBIC) 7.5 MG tablet Take 1 tablet (7.5 mg total) by mouth daily. 30 tablet 2   metFORMIN (GLUCOPHAGE) 500 MG tablet Take 2 tablets (1,000 mg total) by mouth 2 (two) times daily with a meal. 360 tablet 3   metoprolol tartrate (LOPRESSOR) 50 MG tablet TAKE 1 TABLET BY MOUTH TWICE A DAY 180 tablet 1   nitroGLYCERIN (NITROSTAT) 0.4 MG SL tablet PLACE 1 TABLET UNDER THE TONGUE EVERY 5 MIN X 3 DOSES AS NEEDED FOR CHEST PAIN 75 tablet 2   pravastatin (PRAVACHOL) 40 MG tablet Take 80 mg by mouth daily.     Semaglutide,0.25 or 0.'5MG'$ /DOS, (OZEMPIC, 0.25 OR 0.5 MG/DOSE,) 2 MG/1.5ML SOPN Inject 0.5 mg into the skin once a week. 4.5 mL 3   sertraline (ZOLOFT) 25 MG tablet Take 1 tablet (25 mg total) by mouth daily. 90 tablet 1   amLODipine (NORVASC) 2.5 MG tablet Take 1 tablet (2.5 mg total) by mouth daily. (Patient not taking: Reported on 12/02/2020) 90 tablet 1   isosorbide mononitrate (IMDUR) 60 MG 24 hr tablet isosorbide mononitrate ER 60 mg tablet,extended release 24 hr  TAKE 1 TABLET BY MOUTH EVERY DAY (Patient not  taking: Reported on 12/02/2020)     methocarbamol (ROBAXIN) 500 MG tablet Take 1 tablet (500 mg total) by mouth 2 (two) times daily as needed. (Patient not taking: Reported on 12/02/2020) 20 tablet 0   spironolactone (ALDACTONE) 25 MG tablet spironolactone 25 mg tablet  TAKE 1/2 TABLET BY MOUTH EVERY DAY (Patient not taking: Reported on 12/02/2020)     traMADol (ULTRAM) 50 MG tablet Take 1 tablet (50 mg total) by mouth 3 (three) times daily as needed. (Patient not taking: Reported on 12/02/2020) 30 tablet 2   No current facility-administered medications for this visit.    REVIEW OF SYSTEMS:  '[X]'$  denotes positive finding, '[ ]'$  denotes negative finding Cardiac  Comments:  Chest pain or chest pressure:    Shortness of breath upon exertion:    Short of breath when lying flat:    Irregular heart rhythm:        Vascular    Pain in calf, thigh, or hip brought on by ambulation:    Pain in feet at night that wakes you up from your sleep:     Blood clot in your veins:    Leg swelling:  x       Pulmonary    Oxygen at home:    Productive cough:     Wheezing:         Neurologic    Sudden weakness in arms or legs:     Sudden numbness in arms or legs:     Sudden onset of difficulty speaking or slurred speech:    Temporary loss of vision in one eye:     Problems with dizziness:         Gastrointestinal    Blood in stool:     Vomited blood:         Genitourinary    Burning when urinating:     Blood in  urine:        Psychiatric    Major depression:         Hematologic    Bleeding problems:    Problems with blood clotting too easily:        Skin    Rashes or ulcers:        Constitutional    Fever or chills:      PHYSICAL EXAM Vitals:   12/02/20 1511  BP: 119/71  Pulse: 66  Resp: 20  Temp: 98.7 F (37.1 C)  SpO2: 97%  Weight: 250 lb (113.4 kg)  Height: '5\' 10"'$  (1.778 m)    Constitutional: well appearing. no distress. Appears well nourished.  Neurologic: CN intact. no  focal findings. no sensory loss. Psychiatric:  Mood and affect symmetric and appropriate. Eyes:  No icterus. No conjunctival pallor. Ears, nose, throat:  mucous membranes moist. Midline trachea.  Cardiac: regular rate and rhythm.  Respiratory:  unlabored. Abdominal:  soft, non-tender, non-distended.  Peripheral vascular: 2+ DP pulses.  Extremity: 1-2+ edema ankles to knees bilaterally. No cyanosis. No pallor.  Skin: No gangrene. Venous ulceration about right pretibial skin.  Lymphatic: No Stemmer's sign. No palpable lymphadenopathy.  PERTINENT LABORATORY AND RADIOLOGIC DATA  Most recent CBC CBC Latest Ref Rng & Units 04/09/2019 05/18/2017 11/11/2015  WBC 3.4 - 10.8 x10E3/uL 6.3 6.5 8.0  Hemoglobin 13.0 - 17.7 g/dL 14.6 13.8 13.6  Hematocrit 37.5 - 51.0 % 44.8 42.4 42.2  Platelets 150 - 450 x10E3/uL 186 216 240     Most recent CMP CMP Latest Ref Rng & Units 04/30/2020 01/03/2020 08/29/2019  Glucose 65 - 99 mg/dL 90 122(H) 119(H)  BUN 8 - 27 mg/dL '10 11 11  '$ Creatinine 0.76 - 1.27 mg/dL 0.89 0.98 0.97  Sodium 134 - 144 mmol/L 145(H) 144 144  Potassium 3.5 - 5.2 mmol/L 4.4 4.2 4.2  Chloride 96 - 106 mmol/L 106 105 105  CO2 20 - 29 mmol/L '27 24 27  '$ Calcium 8.6 - 10.2 mg/dL 9.3 9.1 9.0  Total Protein 6.0 - 8.5 g/dL 7.3 7.0 6.4  Total Bilirubin 0.0 - 1.2 mg/dL 0.3 0.3 0.2  Alkaline Phos 44 - 121 IU/L 106 90 96  AST 0 - 40 IU/L '14 15 13  '$ ALT 0 - 44 IU/L '16 15 17    '$ Renal function CrCl cannot be calculated (Patient's most recent lab result is older than the maximum 21 days allowed.).  Hemoglobin A1C  Date Value  09/09/2020 6.6 % (A)  09/09/2016 7.9    LDL Chol Calc (NIH)  Date Value Ref Range Status  04/30/2020 148 (H) 0 - 99 mg/dL Final   Direct LDL  Date Value Ref Range Status  12/18/2012 168.0 mg/dL Final    Comment:    Optimal:  <100 mg/dLNear or Above Optimal:  100-129 mg/dLBorderline High:  130-159 mg/dLHigh:  160-189 mg/dLVery High:  >190 mg/dL   LDL Direct  Date  Value Ref Range Status  03/22/2019 131 (H) 0 - 99 mg/dL Final     Lower Venous Reflux Study   Patient Name:  DASHAY SOELBERG  Date of Exam:   12/02/2020  Medical Rec #: [id number]       Accession #:    [id number]  Date of Birth: 1950/10/16      Patient Gender: M  Patient Age:   43 years  Exam Location:  [facility]  Procedure:      VAS [place] LOWER EXTREMITY VENOUS REFLUX  Referring Phys:  Antwanette Wesche    ---------------------------------------------------------------------------  -----     Indications: Swelling, and sores on leg.   Other Indications: Patient states swelling has improved after taking water                     pills.   Comparison Study: None   Performing Technologist: Ivan Croft      Examination Guidelines: A complete evaluation includes B-mode imaging,  spectral  Doppler, color Doppler, and power Doppler as needed of all accessible  portions  of each vessel. Bilateral testing is considered an integral part of a  complete  examination. Limited examinations for reoccurring indications may be  performed  as noted. The reflux portion of the exam is performed with the patient in  reverse Trendelenburg.  Significant venous reflux is defined as >500 ms in the superficial venous  system, and >1 second in the deep venous system.      Venous Reflux Times  +--------------+---------+------+-----------+------------+--------+  RIGHT         Reflux NoRefluxReflux TimeDiameter cmsComments                          Yes                                   +--------------+---------+------+-----------+------------+--------+  CFV           no                                              +--------------+---------+------+-----------+------------+--------+  FV mid        no                                              +--------------+---------+------+-----------+------------+--------+  Popliteal     no                                               +--------------+---------+------+-----------+------------+--------+  GSV at [facility]    no                            0.68              +--------------+---------+------+-----------+------------+--------+  GSV prox thighno                            0.46              +--------------+---------+------+-----------+------------+--------+  GSV mid thigh no                            0.43              +--------------+---------+------+-----------+------------+--------+  GSV dist thighno                            0.47              +--------------+---------+------+-----------+------------+--------+  GSV at knee   no                            0.38              +--------------+---------+------+-----------+------------+--------+  GSV prox calf no                            0.41              +--------------+---------+------+-----------+------------+--------+  SSV Pop Fossa no                            0.38              +--------------+---------+------+-----------+------------+--------+  SSV prox calf no                            0.44              +--------------+---------+------+-----------+------------+--------+  SSV mid calf  no                            0.27              +--------------+---------+------+-----------+------------+--------+      +--------------+---------+------+-----------+------------+--------+  LEFT          Reflux NoRefluxReflux TimeDiameter cmsComments                          Yes                                   +--------------+---------+------+-----------+------------+--------+  CFV           no                                              +--------------+---------+------+-----------+------------+--------+  FV mid        no                                              +--------------+---------+------+-----------+------------+--------+  Popliteal     no                                               +--------------+---------+------+-----------+------------+--------+  GSV at [facility]    no                            0.51              +--------------+---------+------+-----------+------------+--------+  GSV prox thighno                            0.46              +--------------+---------+------+-----------+------------+--------+  GSV mid thigh no  0.38              +--------------+---------+------+-----------+------------+--------+  GSV dist thighno                            0.39              +--------------+---------+------+-----------+------------+--------+  GSV at knee   no                            0.36              +--------------+---------+------+-----------+------------+--------+  GSV prox calf no                            0.32              +--------------+---------+------+-----------+------------+--------+  SSV Pop Fossa no                            0.27              +--------------+---------+------+-----------+------------+--------+  SSV prox calf no                            0.26              +--------------+---------+------+-----------+------------+--------+  SSV mid calf  no                            0.37              +--------------+---------+------+-----------+------------+--------+           Summary:  Bilateral:  - No evidence of deep vein thrombosis seen in the lower extremities,  bilaterally, from the common femoral through the popliteal veins.  - No evidence of superficial venous thrombosis in the lower extremities,  bilaterally.     - No evidence of deep or superficial venous insufficiency seen bilaterally  in the lower extremity.     *See table(s) above for measurements and observations.   Electronically signed by Jamelle Haring on 12/02/2020 at 5:16:47 PM.   Yevonne Aline. Stanford Breed, MD [facility]  Phone Number: [phone] 12/02/2020 4:10 PM  Total time spent on preparing this encounter including chart review, data review, collecting history, examining the patient, coordinating care for this new patient, 45 minutes.  Portions of this report may have been transcribed using voice recognition software.  Every effort has been made to ensure accuracy; however, inadvertent computerized transcription errors may still be present.

## 2020-12-02 ENCOUNTER — Other Ambulatory Visit: Payer: Self-pay

## 2020-12-02 ENCOUNTER — Ambulatory Visit (INDEPENDENT_AMBULATORY_CARE_PROVIDER_SITE_OTHER): Payer: Federal, State, Local not specified - PPO | Admitting: Vascular Surgery

## 2020-12-02 ENCOUNTER — Encounter: Payer: Self-pay | Admitting: Vascular Surgery

## 2020-12-02 ENCOUNTER — Ambulatory Visit (HOSPITAL_COMMUNITY)
Admission: RE | Admit: 2020-12-02 | Discharge: 2020-12-02 | Disposition: A | Discharge status: Home / Self Care | Payer: Federal, State, Local not specified - PPO | Source: Ambulatory Visit | Attending: Vascular Surgery | Admitting: Vascular Surgery

## 2020-12-02 VITALS — BP 119/71 | HR 66 | Temp 98.7°F | Resp 20 | Ht 70.0 in | Wt 250.0 lb

## 2020-12-02 DIAGNOSIS — M7989 Other specified soft tissue disorders: Secondary | ICD-10-CM | POA: Diagnosis not present

## 2020-12-02 DIAGNOSIS — I872 Venous insufficiency (chronic) (peripheral): Secondary | ICD-10-CM

## 2020-12-03 ENCOUNTER — Encounter: Payer: Self-pay | Admitting: Internal Medicine

## 2020-12-05 ENCOUNTER — Ambulatory Visit (INDEPENDENT_AMBULATORY_CARE_PROVIDER_SITE_OTHER): Payer: Federal, State, Local not specified - PPO | Admitting: Registered Nurse

## 2020-12-05 ENCOUNTER — Encounter: Payer: Self-pay | Admitting: Registered Nurse

## 2020-12-05 ENCOUNTER — Other Ambulatory Visit: Payer: Self-pay

## 2020-12-05 VITALS — BP 160/85 | HR 75 | Temp 98.2°F | Resp 19 | Ht 70.0 in | Wt 255.2 lb

## 2020-12-05 DIAGNOSIS — Z23 Encounter for immunization: Secondary | ICD-10-CM | POA: Diagnosis not present

## 2020-12-05 DIAGNOSIS — I10 Essential (primary) hypertension: Secondary | ICD-10-CM | POA: Diagnosis not present

## 2020-12-05 DIAGNOSIS — E785 Hyperlipidemia, unspecified: Secondary | ICD-10-CM

## 2020-12-05 DIAGNOSIS — R350 Frequency of micturition: Secondary | ICD-10-CM

## 2020-12-05 LAB — POCT URINALYSIS DIPSTICK
Bilirubin, UA: NEGATIVE
Blood, UA: NEGATIVE
Glucose, UA: NEGATIVE
Ketones, UA: NEGATIVE
Leukocytes, UA: NEGATIVE
Nitrite, UA: NEGATIVE
Protein, UA: NEGATIVE
Spec Grav, UA: >=1.03 — AB (ref 1.010–1.025)
Urobilinogen, UA: 0.2 E.U./dL
pH, UA: 6 (ref 5.0–8.0)

## 2020-12-05 LAB — LIPID PANEL
Cholesterol: 197 mg/dL (ref 0–200)
HDL: 38.8 mg/dL — ABNORMAL LOW (ref >39.00)
LDL Cholesterol: 139 mg/dL — ABNORMAL HIGH (ref 0–99)
NonHDL: 157.76
Total CHOL/HDL Ratio: 5
Triglycerides: 93 mg/dL (ref 0.0–149.0)
VLDL: 18.6 mg/dL (ref 0.0–40.0)

## 2020-12-05 LAB — COMPREHENSIVE METABOLIC PANEL
ALT: 21 U/L (ref 0–53)
AST: 18 U/L (ref 0–37)
Albumin: 4 g/dL (ref 3.5–5.2)
Alkaline Phosphatase: 81 U/L (ref 39–117)
BUN: 19 mg/dL (ref 6–23)
CO2: 32 mEq/L (ref 19–32)
Calcium: 9.4 mg/dL (ref 8.4–10.5)
Chloride: 103 mEq/L (ref 96–112)
Creatinine, Ser: 1.04 mg/dL (ref 0.40–1.50)
GFR: 72.61 mL/min (ref >60.00)
Glucose, Bld: 119 mg/dL — ABNORMAL HIGH (ref 70–99)
Potassium: 4.2 mEq/L (ref 3.5–5.1)
Sodium: 141 mEq/L (ref 135–145)
Total Bilirubin: 0.4 mg/dL (ref 0.2–1.2)
Total Protein: 7 g/dL (ref 6.0–8.3)

## 2020-12-05 LAB — PSA, MEDICARE: PSA: 0.9 ng/ml (ref 0.10–4.00)

## 2020-12-05 LAB — HEMOGLOBIN A1C: Hgb A1c MFr Bld: 7 % — ABNORMAL HIGH (ref 4.6–6.5)

## 2020-12-05 MED ORDER — SULFAMETHOXAZOLE-TRIMETHOPRIM 800-160 MG PO TABS: 1.0000 | ORAL_TABLET | Freq: Two times a day (BID) | ORAL | 0 refills | Status: DC

## 2020-12-05 NOTE — Patient Instructions (Signed)
Mr. [name] to meet you  In brief:  Bactrim - take twice daily for five days for urinary symptoms. Sending out a urine test to tell if this will be effective. Stay very well hydrated. Testing for cholesterol, liver and kidney function, sugars, and prostate abnormalities today. I'll call if there are any major worries. Let's plan to have you see Dr. Carlota Raspberry in 6 months, sooner if labs show concerns.  Call me if you need anything  Thanks,  Denice Paradise

## 2020-12-07 LAB — URINE CULTURE
MICRO NUMBER:: 12329555
Result:: NO GROWTH
SPECIMEN QUALITY:: ADEQUATE

## 2020-12-09 ENCOUNTER — Ambulatory Visit: Payer: Federal, State, Local not specified - PPO | Admitting: Orthopaedic Surgery

## 2020-12-10 ENCOUNTER — Other Ambulatory Visit: Payer: Self-pay

## 2020-12-10 ENCOUNTER — Ambulatory Visit (INDEPENDENT_AMBULATORY_CARE_PROVIDER_SITE_OTHER): Payer: Federal, State, Local not specified - PPO | Admitting: Physician Assistant

## 2020-12-10 ENCOUNTER — Ambulatory Visit (HOSPITAL_BASED_OUTPATIENT_CLINIC_OR_DEPARTMENT_OTHER): Payer: Federal, State, Local not specified - PPO | Admitting: Family

## 2020-12-10 VITALS — BP 147/87 | HR 69 | Temp 97.9°F | Resp 20 | Ht 70.0 in | Wt 254.6 lb

## 2020-12-10 DIAGNOSIS — L97819 Non-pressure chronic ulcer of other part of right lower leg with unspecified severity: Secondary | ICD-10-CM | POA: Diagnosis not present

## 2020-12-10 DIAGNOSIS — I83018 Varicose veins of right lower extremity with ulcer other part of lower leg: Secondary | ICD-10-CM | POA: Diagnosis not present

## 2020-12-10 DIAGNOSIS — I8393 Asymptomatic varicose veins of bilateral lower extremities: Secondary | ICD-10-CM

## 2020-12-10 DIAGNOSIS — I872 Venous insufficiency (chronic) (peripheral): Secondary | ICD-10-CM

## 2020-12-10 NOTE — Progress Notes (Signed)
Patient present in office today for Unilateral Unna boot change on RLE. Today the wound located on the RLE [name] has slightly improved per patient. He denies pain,swelling and drainage. Today unna boot wrap was replaced. Patient tolerated Unna boot wrapping well & advised he will need to return to our office for the next 2 weeks (Unless the wound heals sooner) for Unna boot changes. If his wound heals sooner he is ok to discontinue unna boot wraps and resume wearing Knee-high 20-30 Compression hose along with proper elevation. Patient also advised to elevate his legs while wearing unna boot. Patient purchased an additional pair of Knee-high 20-30 compression hose while in office today & voiced his understanding of how to properly elevate his legs.   Supervising MD: Saverio Danker A.,CMA

## 2020-12-11 ENCOUNTER — Encounter: Payer: Self-pay | Admitting: Family Medicine

## 2020-12-13 DIAGNOSIS — Z20822 Contact with and (suspected) exposure to covid-19: Secondary | ICD-10-CM | POA: Diagnosis not present

## 2020-12-18 NOTE — Progress Notes (Signed)
Established Patient Office Visit  Subjective:  Patient ID: [name], male    DOB: Nov 14, 1950  Age: 70 y.o. MRN: [id number]  CC:  Chief Complaint  Patient presents with   Hyperlipidemia   Urinary Tract Infection    HPI [name] presents for follow up on lipids and urinary symptoms  Lipids: Taking pravastatin '40mg'$  po qd. Unsure of efficacy.about 8 mo since last labs Lab Results  Component Value Date   CHOL 197 12/05/2020   CHOL 207 (H) 04/30/2020   CHOL 154 08/29/2019   Lab Results  Component Value Date   HDL 38.80 (L) 12/05/2020   HDL 41 04/30/2020   HDL 38 (L) 08/29/2019   Lab Results  Component Value Date   LDLCALC 139 (H) 12/05/2020   LDLCALC 148 (H) 04/30/2020   LDLCALC 101 (H) 08/29/2019   Lab Results  Component Value Date   TRIG 93.0 12/05/2020   TRIG 101 04/30/2020   TRIG 78 08/29/2019   Lab Results  Component Value Date   CHOLHDL 5 12/05/2020   CHOLHDL 5.0 04/30/2020   CHOLHDL 4.1 08/29/2019   Lab Results  Component Value Date   LDLDIRECT 131 (H) 03/22/2019   LDLDIRECT 117 (H) 01/17/2018   LDLDIRECT 168.0 12/18/2012   t2dm Last A1c: 6.6 on 09/09/20 Currently taking: metformin '1000mg'$  po bid ac, glipizide '5mg'$  ER PO qd ac No new complications Reports good compliance with medications Diet has been steady Exercise habits have been limited  Hypertension: Patient Currently taking: hydralazine '50mg'$  po tid, amlodipine 2.'5mg'$  po qd,  Good effect. No AEs. Denies CV symptoms including: chest pain, shob, doe, headache, visual changes, fatigue, claudication, and dependent edema.   Previous readings and labs: BP Readings from Last 3 Encounters:  12/10/20 (!) 147/87  12/05/20 (!) 160/85  12/02/20 119/71   Lab Results  Component Value Date   CREATININE 1.04 12/05/2020     Urinary symptoms Frequency, urgency, burning No STI exposure No gross hematuria or hematospermia No constitutional symptoms.  Past Medical History:  Diagnosis  Date   Adenomatous colon polyp 2011   Anemia    Angina pectoris ([place]) 05/01/2015   med rx for 95% OM (u/a to access due to tortuosity), 95% D1 and other moderate CAD at cath   Arthritis    "back, knees" (05/01/2015)   Chest pain    pleuritic   Chronic lower back pain    Complex tear of medial meniscus of left knee 09/14/2018   Coronary artery disease    Depression    GERD (gastroesophageal reflux disease)    HTN (hypertension)    Hyperlipidemia    Obstructive sleep apnea    noncompliant with CPAP   Osteoarthritis    Pneumonia ~ 2014 X 1   Prostate cancer ([place])    Refusal of blood transfusions as patient is Jehovah's Witness    Rotator cuff arthropathy, right    Rupture of right supraspinatus tendon 01/18/2019   Type II or unspecified type diabetes mellitus without mention of complication, not stated as uncontrolled     Past Surgical History:  Procedure Laterality Date   CARDIAC CATHETERIZATION  2004   patent coronary arteries   CARDIAC CATHETERIZATION  05/01/2015   "tried to put stent in but couldn't"   CARDIAC CATHETERIZATION N/A 05/01/2015   Procedure: Left Heart Cath and Coronary Angiography;  Surgeon: Belva Crome, MD; LAD 60%, oD1 90%, pD1 70%, D2 70%, CFX patent stent, 30% distal to prev stent, pRCA 20%, OM1  90/95%, NL LV   CARDIAC CATHETERIZATION N/A 05/01/2015   Procedure: Coronary Stent Intervention;  Surgeon: Belva Crome, MD;  Unsuccessful PCI OM due to tortuosity   CARDIAC CATHETERIZATION N/A 11/06/2014   Procedure: Left Heart Cath and Coronary Angiography;  Surgeon: Belva Crome, MD;  Location: [facility];  Service: Cardiovascular;  Laterality: N/A;   CLOSED REDUCTION SHOULDER DISLOCATION Right ~ 1975   "& reattached muscle"   COLONOSCOPY W/ BIOPSIES AND POLYPECTOMY  X 2   ESOPHAGOGASTRODUODENOSCOPY ENDOSCOPY     KNEE ARTHROSCOPY WITH MEDIAL MENISECTOMY Left 09/14/2018   Procedure: LEFT KNEE ARTHROSCOPY CHONDROPLASY,  WITH MEDIAL MENISECTOMY;  Surgeon:  Marchia Bond, MD;  Location: [facility];  Service: Orthopedics;  Laterality: Left;   PILONIDAL CYST EXCISION     SHOULDER ARTHROSCOPY WITH DEBRIDEMENT AND BICEP TENDON REPAIR Right 01/18/2019   Procedure: RIGHT SHOULDER ARTHROSCOPY WITH DEBRIDEMENT AND DECOMPRSSION SUBACROMIAL PARTIAL ACROMIOPLASTY;  Surgeon: Marchia Bond, MD;  Location: [facility];  Service: Orthopedics;  Laterality: Right;   SHOULDER ARTHROSCOPY WITH ROTATOR CUFF REPAIR Right 01/18/2019   Procedure: SHOULDER ARTHROSCOPY WITH ROTATOR CUFF REPAIR;  Surgeon: Marchia Bond, MD;  Location: [facility];  Service: Orthopedics;  Laterality: Right;    Family History  Problem Relation Age of Onset   Coronary artery disease Other        CABG   Coronary artery disease Mother    Stroke Mother    Heart disease Mother    Diabetes Mother    Other Father    Diabetes Sister    Diabetes Maternal Uncle        x2   Alcohol abuse Other    Diabetes Paternal Uncle        x2   Colon cancer Paternal Uncle    Colon polyps Neg Hx    Esophageal cancer Neg Hx     Social History   Socioeconomic History   Marital status: Married    Spouse name: [name]   Number of children: 4   Years of education: Not on file   Highest education level: High school graduate  Occupational History   Occupation: Retired    Fish farm manager: UNEMPLOYED  Tobacco Use   Smoking status: Former    Packs/day: 0.50    Years: 10.00    Pack years: 5.00    Types: Cigarettes    Quit date: 04/06/1983    Years since quitting: 37.7   Smokeless tobacco: Never  Vaping Use   Vaping Use: Never used  Substance and Sexual Activity   Alcohol use: Yes    Alcohol/week: 0.0 standard drinks    Comment: `/26/2017 "might drink a beer q couple months mostly; summertime I might drink 2-3 beers/week"   Drug use: No   Sexual activity: Not Currently  Other Topics Concern   Not on file  Social History Narrative   Lives in [place]  with spouse Les Geeslin).  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   Social Determinants of Health   Financial Resource Strain: Not on file  Food Insecurity: Not on file  Transportation Needs: Not on file  Physical Activity: Not on file  Stress: Not on file  Social Connections: Not on file  Intimate Partner Violence: Not on file    Outpatient Medications Prior to Visit  Medication Sig Dispense Refill   albuterol (VENTOLIN HFA) 108 (90 Base) MCG/ACT inhaler INHALE 1-2 PUFFS INTO  THE LUNGS EVERY 4 (FOUR) HOURS AS NEEDED FOR WHEEZING OR SHORTNESS OF BREATH. 18 each 2   aspirin EC 81 MG tablet Take 81 mg by mouth daily. Swallow whole.     Blood Glucose Monitoring Suppl (RELION PREMIER BLU MONITOR) DEVI 1 Device by Does not apply route daily. 1 Device 0   fluticasone (FLONASE) 50 MCG/ACT nasal spray Place 1-2 sprays into both nostrils daily. 48 mL 2   glipiZIDE (GLUCOTROL XL) 5 MG 24 hr tablet Take 1 tablet (5 mg total) by mouth 2 (two) times daily. 180 tablet 3   glucose blood test strip Use as instructed to check sugar 2 times daily 200 each 5   hydrALAZINE (APRESOLINE) 50 MG tablet Take 1 tablet (50 mg total) by mouth 3 (three) times daily. 270 tablet 3   hydrochlorothiazide (HYDRODIURIL) 25 MG tablet TAKE 1 TABLET BY MOUTH EVERY DAY 90 tablet 3   losartan (COZAAR) 100 MG tablet TAKE 1 TABLET BY MOUTH EVERY DAY 90 tablet 3   meloxicam (MOBIC) 7.5 MG tablet Take 1 tablet (7.5 mg total) by mouth daily. 30 tablet 2   metFORMIN (GLUCOPHAGE) 500 MG tablet Take 2 tablets (1,000 mg total) by mouth 2 (two) times daily with a meal. 360 tablet 3   metoprolol tartrate (LOPRESSOR) 50 MG tablet TAKE 1 TABLET BY MOUTH TWICE A DAY 180 tablet 1   nitroGLYCERIN (NITROSTAT) 0.4 MG SL tablet PLACE 1 TABLET UNDER THE TONGUE EVERY 5 MIN X 3 DOSES AS NEEDED FOR CHEST PAIN 75 tablet 2   pravastatin (PRAVACHOL) 40 MG tablet Take 80 mg by mouth daily.      Semaglutide,0.25 or 0.'5MG'$ /DOS, (OZEMPIC, 0.25 OR 0.5 MG/DOSE,) 2 MG/1.5ML SOPN Inject 0.5 mg into the skin once a week. 4.5 mL 3   sertraline (ZOLOFT) 25 MG tablet Take 1 tablet (25 mg total) by mouth daily. 90 tablet 1   amLODipine (NORVASC) 2.5 MG tablet Take 1 tablet (2.5 mg total) by mouth daily. 90 tablet 1   isosorbide mononitrate (IMDUR) 60 MG 24 hr tablet isosorbide mononitrate ER 60 mg tablet,extended release 24 hr  TAKE 1 TABLET BY MOUTH EVERY DAY     methocarbamol (ROBAXIN) 500 MG tablet Take 1 tablet (500 mg total) by mouth 2 (two) times daily as needed. 20 tablet 0   spironolactone (ALDACTONE) 25 MG tablet spironolactone 25 mg tablet  TAKE 1/2 TABLET BY MOUTH EVERY DAY     traMADol (ULTRAM) 50 MG tablet Take 1 tablet (50 mg total) by mouth 3 (three) times daily as needed. 30 tablet 2   No facility-administered medications prior to visit.    Allergies  Allergen Reactions   Statins Other (See Comments)    REACTION: joint pain Lipitor- headaches Has also tried Livalo, pravachol, zetia, crestor, welchol   Imdur [Isosorbide Dinitrate]     Headache - "violent"    ROS Review of Systems Per hpi     Objective:    Physical Exam Constitutional:      General: He is not in acute distress.    Appearance: Normal appearance. He is normal weight. He is not ill-appearing, toxic-appearing or diaphoretic.  Cardiovascular:     Rate and Rhythm: Normal rate and regular rhythm.     Heart sounds: Normal heart sounds. No murmur heard.   No friction rub. No gallop.  Pulmonary:     Effort: Pulmonary effort is normal. No respiratory distress.     Breath sounds: Normal breath sounds. No stridor. No wheezing, rhonchi  or rales.  Chest:     Chest wall: No tenderness.  Musculoskeletal:        General: No tenderness.  Neurological:     General: No focal deficit present.     Mental Status: He is alert and oriented to person, place, and time. Mental status is at baseline.  Psychiatric:         Mood and Affect: Mood normal.        Behavior: Behavior normal.        Thought Content: Thought content normal.        Judgment: Judgment normal.    BP (!) 160/85   Pulse 75   Temp 98.2 F (36.8 C) (Temporal)   Resp 19   Ht '5\' 10"'$  (1.778 m)   Wt 255 lb 3.2 oz (115.8 kg)   SpO2 98%   BMI 36.62 kg/m  Wt Readings from Last 3 Encounters:  12/10/20 254 lb 9.6 oz (115.5 kg)  12/05/20 255 lb 3.2 oz (115.8 kg)  12/02/20 250 lb (113.4 kg)     Health Maintenance Due  Topic Date Due   Zoster Vaccines- Shingrix (1 of 2) Never done   COVID-19 Vaccine (4 - Booster for Moderna series) 05/14/2020    There are no preventive care reminders to display for this patient.  Lab Results  Component Value Date   TSH 1.210 04/09/2019   Lab Results  Component Value Date   WBC 6.3 04/09/2019   HGB 14.6 04/09/2019   HCT 44.8 04/09/2019   MCV 87 04/09/2019   PLT 186 04/09/2019   Lab Results  Component Value Date   NA 141 12/05/2020   K 4.2 12/05/2020   CO2 32 12/05/2020   GLUCOSE 119 (H) 12/05/2020   BUN 19 12/05/2020   CREATININE 1.04 12/05/2020   BILITOT 0.4 12/05/2020   ALKPHOS 81 12/05/2020   AST 18 12/05/2020   ALT 21 12/05/2020   PROT 7.0 12/05/2020   ALBUMIN 4.0 12/05/2020   CALCIUM 9.4 12/05/2020   ANIONGAP 9 01/15/2019   GFR 72.61 12/05/2020   Lab Results  Component Value Date   CHOL 197 12/05/2020   Lab Results  Component Value Date   HDL 38.80 (L) 12/05/2020   Lab Results  Component Value Date   LDLCALC 139 (H) 12/05/2020   Lab Results  Component Value Date   TRIG 93.0 12/05/2020   Lab Results  Component Value Date   CHOLHDL 5 12/05/2020   Lab Results  Component Value Date   HGBA1C 7.0 (H) 12/05/2020      Assessment & Plan:   Problem List Items Addressed This Visit       Other   Hyperlipidemia   Relevant Orders   Comprehensive metabolic panel (Completed)   Lipid panel (Completed)   Hemoglobin A1c (Completed)   Other Visit Diagnoses      Frequent urination    -  Primary   Relevant Medications   sulfamethoxazole-trimethoprim (BACTRIM DS) 800-160 MG tablet   Other Relevant Orders   POCT urinalysis dipstick (Completed)   Hemoglobin A1c (Completed)   Urine Culture (Completed)   PSA, Medicare (  [facility] only) (Completed)   Essential hypertension       Relevant Orders   Comprehensive metabolic panel (Completed)   Lipid panel (Completed)   Flu vaccine need       Relevant Orders   Flu Vaccine QUAD High Dose(Fluad) (Completed)       Meds ordered this encounter  Medications   sulfamethoxazole-trimethoprim (  BACTRIM DS) 800-160 MG tablet    Sig: Take 1 tablet by mouth 2 (two) times daily.    Dispense:  10 tablet    Refill:  0    Order Specific Question:   Supervising Provider    Answer:   Carlota Raspberry, JEFFREY R [id number]    Follow-up: Return in about 6 months (around 06/04/2021) for htn,hld with PCP Carlota Raspberry.   PLAN Labs collected. Will follow up with the patient as warranted. Poct ua shows some concern for UTI. Treat presumtively. Will culture and follow up. Flu shot given. Return in 6 mo unless labs indicate otherwise. Patient encouraged to call clinic with any questions, comments, or concerns.  Maximiano Coss, NP

## 2020-12-19 DIAGNOSIS — G4733 Obstructive sleep apnea (adult) (pediatric): Secondary | ICD-10-CM | POA: Diagnosis not present

## 2020-12-21 ENCOUNTER — Other Ambulatory Visit: Payer: Self-pay | Admitting: Physician Assistant

## 2021-01-06 ENCOUNTER — Other Ambulatory Visit: Payer: Self-pay

## 2021-01-06 ENCOUNTER — Ambulatory Visit: Payer: Federal, State, Local not specified - PPO | Attending: Internal Medicine

## 2021-01-06 ENCOUNTER — Other Ambulatory Visit (HOSPITAL_BASED_OUTPATIENT_CLINIC_OR_DEPARTMENT_OTHER): Payer: Self-pay

## 2021-01-06 DIAGNOSIS — Z23 Encounter for immunization: Secondary | ICD-10-CM

## 2021-01-06 MED ORDER — MODERNA COVID-19 BIVAL BOOSTER 50 MCG/0.5ML IM SUSP
INTRAMUSCULAR | 0 refills | Status: DC
  Filled 2021-01-06: qty 0.5, 1d supply, fill #0

## 2021-01-06 NOTE — Progress Notes (Signed)
   Covid-19 Vaccination Clinic  Name:  [name]    MRN: [id number] DOB: 15-Feb-1951  01/06/2021  [name] was observed post Covid-19 immunization for 15 minutes without incident. He was provided with Vaccine Information Sheet and instruction to access the V-Safe system.   [name] was instructed to call 911 with any severe reactions post vaccine: Difficulty breathing  Swelling of face and throat  A fast heartbeat  A bad rash all over body  Dizziness and weakness

## 2021-01-07 ENCOUNTER — Other Ambulatory Visit (HOSPITAL_BASED_OUTPATIENT_CLINIC_OR_DEPARTMENT_OTHER): Payer: Self-pay

## 2021-01-08 ENCOUNTER — Telehealth: Payer: Self-pay | Admitting: Family Medicine

## 2021-01-08 NOTE — Chronic Care Management (AMB) (Signed)
  Chronic Care Management   Note  01/08/2021 Name: NIKOLIS BERENT MRN: [id number] DOB: 13-Apr-1950  JAMORRIS NDIAYE is a 70 y.o. year old male who is a primary care patient of Wendie Agreste, MD. I reached out to [name] by phone today in response to a referral sent by Mr. Caryn Section Tejera's PCP, Wendie Agreste, MD.   Mr. Franzel was given information about Chronic Care Management services today including:  CCM service includes personalized support from designated clinical staff supervised by his physician, including individualized plan of care and coordination with other care providers 24/7 contact phone numbers for assistance for urgent and routine care needs. Service will only be billed when office clinical staff spend 20 minutes or more in a month to coordinate care. Only one practitioner may furnish and bill the service in a calendar month. The patient may stop CCM services at any time (effective at the end of the month) by phone call to the office staff.   Patient agreed to services and verbal consent obtained.   Follow up plan:   Tatjana Secretary/administrator

## 2021-01-13 ENCOUNTER — Encounter: Payer: Self-pay | Admitting: Family Medicine

## 2021-01-18 DIAGNOSIS — G4733 Obstructive sleep apnea (adult) (pediatric): Secondary | ICD-10-CM | POA: Diagnosis not present

## 2021-01-20 ENCOUNTER — Ambulatory Visit: Payer: Medicare Other | Admitting: Internal Medicine

## 2021-01-26 ENCOUNTER — Telehealth: Payer: Self-pay

## 2021-01-26 NOTE — Progress Notes (Signed)
Chronic Care Management Pharmacy Assistant   Name: [name]  MRN: [id number] DOB: [birth year]  [name] is an 70 y.o. year old male who presents for his initial CCM visit with the clinical pharmacist.  Reason for Encounter: Chart Prep for Initial visit with CPP   Recent office visits:   12/05/20 Nedra Hai, NP - Family Medicine  - Urinary frequency 0- Labs ordered. Sulfamethoxazole-trimethoprim (BACTRIM DS) 800-160 MG tablet Take 1 tablet by mouth 2 (two) times daily prescribed. Flu vaccine administered. Follow up in 6 months.   Recent consult visits:  12/13/20 [place] with suspected exposure to [facility] [facility] testing performed and negative. Follow up not indicated.   12/10/20 Emma collins, PA-C 0 Vascular - Chronic venous insufficiencies - unna boot wrap was replaced. Follow up in 2 weeks.   12/02/20 Jamelle Haring, MD - Vascular - Chronic venous insufficiencies -  No medication changes. Follow up not indicated.   11/04/20 [name], MD - Orthopedics - Bilateral osteoarthritis of both knees - Large joint injection bilateral done in office. Follow up as needed.   09/30/20 [name], MD - Orthopedics - Bilateral osteoarthritis of both knees - Bilateral xrays ordered. traMADol (ULTRAM) 50 MG tablet Take 1 tablet (50 mg total) by mouth 3 (three) times daily as needed prescribed. Follow up in 6 months.   09/10/20 [name], MD - Urology - Prostate Cancer - Labs ordered. No medication changes. Follow up in 6 months.  09/09/20 Charlesetta Shanks, MD - Internal Medicine - Diabetes - Labs were ordered. No medication changes. Follow up in 4 months.   09/03/20 [name], MD - Physical Medicine - Neck pain - Xrays ordered. MRI C spine ordered. No medication changes. Follow up not indicated.    Hospital visits:  Medication Reconciliation was completed by comparing discharge summary, patient's EMR and Pharmacy list, and upon discussion with  patient.  Admitted to the hospital on 08/17/20 due to Abrasion of right lower extremity. Discharge date was 08/17/20. Discharged from [facility]?Medications Started at [facility] Discharge:?? None noted.   Medication Changes at Hospital Discharge: No changes noted.   Medications Discontinued at Hospital Discharge: No medications were discontinued at discharge.   Medications that remain the same after Hospital Discharge:??  All other medications will remain the same.    Medications: Outpatient Encounter Medications as of 01/26/2021  Medication Sig   albuterol (VENTOLIN HFA) 108 (90 Base) MCG/ACT inhaler INHALE 1-2 PUFFS INTO THE LUNGS EVERY 4 (FOUR) HOURS AS NEEDED FOR WHEEZING OR SHORTNESS OF BREATH.   amLODipine (NORVASC) 2.5 MG tablet Take 1 tablet (2.5 mg total) by mouth daily.   aspirin EC 81 MG tablet Take 81 mg by mouth daily. Swallow whole.   Blood Glucose Monitoring Suppl (RELION PREMIER BLU MONITOR) DEVI 1 Device by Does not apply route daily.   COVID-19 mRNA bivalent vaccine, Moderna, (MODERNA COVID-19 BIVAL BOOSTER) 50 MCG/0.5ML injection Inject into the muscle.   fluticasone (FLONASE) 50 MCG/ACT nasal spray Place 1-2 sprays into both nostrils daily.   glipiZIDE (GLUCOTROL XL) 5 MG 24 hr tablet Take 1 tablet (5 mg total) by mouth 2 (two) times daily.   glucose blood test strip Use as instructed to check sugar 2 times daily   hydrALAZINE (APRESOLINE) 50 MG tablet Take 1 tablet (50 mg total) by mouth 3 (three) times daily.   hydrochlorothiazide (HYDRODIURIL) 25 MG tablet TAKE 1 TABLET BY MOUTH EVERY DAY  isosorbide mononitrate (IMDUR) 60 MG 24 hr tablet isosorbide mononitrate ER 60 mg tablet,extended release 24 hr  TAKE 1 TABLET BY MOUTH EVERY DAY   losartan (COZAAR) 100 MG tablet TAKE 1 TABLET BY MOUTH EVERY DAY   meloxicam (MOBIC) 7.5 MG tablet Take 1 tablet (7.5 mg total) by mouth daily.   metFORMIN (GLUCOPHAGE) 500 MG tablet Take 2 tablets (1,000 mg  total) by mouth 2 (two) times daily with a meal.   methocarbamol (ROBAXIN) 500 MG tablet Take 1 tablet (500 mg total) by mouth 2 (two) times daily as needed.   metoprolol tartrate (LOPRESSOR) 50 MG tablet TAKE 1 TABLET BY MOUTH TWICE A DAY   nitroGLYCERIN (NITROSTAT) 0.4 MG SL tablet PLACE 1 TABLET UNDER THE TONGUE EVERY 5 MIN X 3 DOSES AS NEEDED FOR CHEST PAIN   pravastatin (PRAVACHOL) 40 MG tablet Take 80 mg by mouth daily.   Semaglutide,0.25 or 0.5MG /DOS, (OZEMPIC, 0.25 OR 0.5 MG/DOSE,) 2 MG/1.5ML SOPN Inject 0.5 mg into the skin once a week.   sertraline (ZOLOFT) 25 MG tablet Take 1 tablet (25 mg total) by mouth daily.   spironolactone (ALDACTONE) 25 MG tablet spironolactone 25 mg tablet  TAKE 1/2 TABLET BY MOUTH EVERY DAY   sulfamethoxazole-trimethoprim (BACTRIM DS) 800-160 MG tablet Take 1 tablet by mouth 2 (two) times daily.   traMADol (ULTRAM) 50 MG tablet Take 1 tablet (50 mg total) by mouth 3 (three) times daily as needed.   No facility-administered encounter medications on file as of 01/26/2021.    Have you seen any other providers since your last visit? No  Any changes in your medications or health? no  Any side effects from any medications? no  Do you have an symptoms or problems not managed by your medications? No  Any concerns about your health right now? no  Has your provider asked that you check blood pressure, blood sugar, or follow special diet at home? Yes. Patient is checking his blood sugar at home daily. Averaging around 120 fasting in the am.   Do you get any type of exercise on a regular basis? no  Can you think of a goal you would like to reach for your health?  Patient reported he would like to get his blood sugars stabilized.   Do you have any problems getting your medications? no  Is there anything that you would like to discuss during the appointment? Patient would like more information about the CCM program and what to expect moving forward.  Please  bring medications and supplements to appointment  Patient confirmed appointment date and time.    Care Gaps  AWV: overdue  Colonoscopy:  due 08/01/24 DM Eye Exam: due 04/02/21 DM Foot Exam: unknown  Microalbumin: overdue  HbgAIC: done 12/05/20 (7.0) DEXA: never  Mammogram: N/A   Star Rating Drugs: pravastatin (PRAVACHOL) 40 MG tablet - last filled 10/28/20 90 days  metFORMIN (GLUCOPHAGE) 500 MG tablet - last filled 11/04/20 90 days  losartan (COZAAR) 100 MG tablet - last filled 12/04/20 90 days  glipiZIDE (GLUCOTROL XL) 5 MG 24 hr tablet - last filled 12/13/20 90 days   Future Appointments  Date Time Provider [place]  01/27/2021  2:00 PM [facility] CCM PHARMACIST [facility]  03/12/2021  9:00 AM [facility] None  03/18/2021 11:15 AM [name], MD [facility] None  04/07/2021  2:00 PM [name], MD [facility] None  06/04/2021  1:20 PM Wendie Agreste, MD [facility] [place] [place] Clinical Pharmacist  Assistant  [phone]  Time Spent: 48 minutes

## 2021-01-27 ENCOUNTER — Ambulatory Visit (INDEPENDENT_AMBULATORY_CARE_PROVIDER_SITE_OTHER): Payer: Federal, State, Local not specified - PPO

## 2021-01-27 DIAGNOSIS — I10 Essential (primary) hypertension: Secondary | ICD-10-CM

## 2021-01-27 DIAGNOSIS — I471 Supraventricular tachycardia: Secondary | ICD-10-CM

## 2021-01-27 DIAGNOSIS — G720 Drug-induced myopathy: Secondary | ICD-10-CM

## 2021-01-27 DIAGNOSIS — E785 Hyperlipidemia, unspecified: Secondary | ICD-10-CM

## 2021-01-27 DIAGNOSIS — E1159 Type 2 diabetes mellitus with other circulatory complications: Secondary | ICD-10-CM

## 2021-01-27 NOTE — Progress Notes (Signed)
Chronic Care Management Pharmacy Note  01/28/2021 Name:  [name] MRN:  [id number] DOB:  [birth year]  Summary: Sertraline increase from 25 mg to 50 mg once daily. PHQ 14. Note. MDD dgx may need update on current problem list.  Higher dose sent to pharmacy, will notify pt   Subjective: [name] is an 70 y.o. year old male who is a primary patient of Carlota Raspberry, Ranell Patrick, MD.  The CCM team was consulted for assistance with disease management and care coordination needs.    Engaged with patient by telephone for initial visit in response to provider referral for pharmacy case management and/or care coordination services.   Consent to Services:  The patient was given the following information about Chronic Care Management services today, agreed to services, and gave verbal consent: 1. CCM service includes personalized support from designated clinical staff supervised by the primary care provider, including individualized plan of care and coordination with other care providers 2. 24/7 contact phone numbers for assistance for urgent and routine care needs. 3. Service will only be billed when office clinical staff spend 20 minutes or more in a month to coordinate care. 4. Only one practitioner may furnish and bill the service in a calendar month. 5.The patient may stop CCM services at any time (effective at the end of the month) by phone call to the office staff. 6. The patient will be responsible for cost sharing (co-pay) of up to 20% of the service fee (after annual deductible is met). Patient agreed to services and consent obtained.  Patient Care Team: Wendie Agreste, MD as PCP - General (Family Medicine) Belva Crome, MD as PCP - Cardiology (Cardiology) Madelin Rear, [facility] as Pharmacist (Pharmacist)  Hospital visits: None in previous 6 months  Objective:  Lab Results  Component Value Date   CREATININE 1.04 12/05/2020   CREATININE 0.89 04/30/2020   CREATININE 0.98 01/03/2020     Lab Results  Component Value Date   HGBA1C 7.0 (H) 12/05/2020   HGBA1C 6.6 (A) 09/09/2020   HGBA1C 6.8 (A) 01/31/2020   Last diabetic Eye exam:  Lab Results  Component Value Date/Time   HMDIABEYEEXA No Retinopathy 04/02/2020 12:00 AM    Last diabetic Foot exam:  Lab Results  Component Value Date/Time   HMDIABFOOTEX normal 08/09/2013 12:00 AM        Component Value Date/Time   CHOL 197 12/05/2020 1230   CHOL 207 (H) 04/30/2020 1624   CHOL 154 08/29/2019 1631   CHOL 111 09/25/2018 0930   TRIG 93.0 12/05/2020 1230   TRIG 101 04/30/2020 1624   TRIG 78 08/29/2019 1631   HDL 38.80 (L) 12/05/2020 1230   HDL 41 04/30/2020 1624   HDL 38 (L) 08/29/2019 1631   HDL 44 09/25/2018 0930   CHOLHDL 5 12/05/2020 1230   VLDL 18.6 12/05/2020 1230   LDLCALC 139 (H) 12/05/2020 1230   LDLCALC 148 (H) 04/30/2020 1624   LDLCALC 101 (H) 08/29/2019 1631   LDLCALC 44 09/25/2018 0930   LDLCALC 114 (H) 06/19/2015 1624   LDLCALC 141 (H) 05/09/2014 0909   LDLDIRECT 131 (H) 03/22/2019 1427   LDLDIRECT 117 (H) 01/17/2018 1456   LDLDIRECT 168.0 12/18/2012 1340   LDLDIRECT 174.4 06/14/2012 1510   LDLDIRECT 170.3 05/28/2011 1143    Hepatic Function Latest Ref Rng & Units 12/05/2020 04/30/2020 01/03/2020  Total Protein 6.0 - 8.3 g/dL 7.0 7.3 7.0  Albumin 3.5 - 5.2 g/dL 4.0 4.4 4.2  AST 0 - 37  U/L _0 ALT 0 - 53 U/L _1 Alk Phosphatase 39 - 117 U/L 81 106 90  Total Bilirubin 0.2 - 1.2 mg/dL 0.4 0.3 0.3  Bilirubin, Direct 0.00 - 0.40 mg/dL - - -    Lab Results  Component Value Date/Time   TSH 1.210 04/09/2019 05:08 PM   TSH 1.52 11/11/2015 10:58 AM    CBC Latest Ref Rng & Units 04/09/2019 05/18/2017 11/11/2015  WBC 3.4 - 10.8 x10E3/uL 6.3 6.5 8.0  Hemoglobin 13.0 - 17.7 g/dL 14.6 13.8 13.6  Hematocrit 37.5 - 51.0 % 44.8 42.4 42.2  Platelets 150 - 450 x10E3/uL 186 216 240    No results found for: VD25OH  Clinical ASCVD:  The ASCVD Risk score (Arnett DK, et al., 2019) failed to  calculate for the following reasons:   The patient has a prior MI or stroke diagnosis   Social History   Tobacco Use  Smoking Status Former   Packs/day: 0.50   Years: 10.00   Pack years: 5.00   Types: Cigarettes   Quit date: 04/06/1983   Years since quitting: 37.8  Smokeless Tobacco Never   BP Readings from Last 3 Encounters:  12/10/20 (!) 147/87  12/05/20 (!) 160/85  12/02/20 119/71   Pulse Readings from Last 3 Encounters:  12/10/20 69  12/05/20 75  12/02/20 66   Wt Readings from Last 3 Encounters:  12/10/20 254 lb 9.6 oz (115.5 kg)  12/05/20 255 lb 3.2 oz (115.8 kg)  12/02/20 250 lb (113.4 kg)   BMI Readings from Last 3 Encounters:  12/10/20 36.53 kg/m  12/05/20 36.62 kg/m  12/02/20 35.87 kg/m    Assessment: Review of patient past medical history, allergies, medications, health status, including review of consultants reports, laboratory and other test data, was performed as part of comprehensive evaluation and provision of chronic care management services.   SDOH:  (Social Determinants of Health) assessments and interventions performed: Yes   CCM Care Plan  Allergies  Allergen Reactions   Statins Other (See Comments)    REACTION: joint pain Lipitor- headaches Has also tried Livalo, pravachol, zetia, crestor, welchol   Imdur [Isosorbide Dinitrate]     Headache - "violent"    Medications Reviewed Today     Reviewed by Madelin Rear, [facility] (Pharmacist) on 01/27/21 at 1610  Med List Status: <None>   Medication Order Taking? Sig Documenting Provider Last Dose Status Informant  albuterol (VENTOLIN HFA) 108 (90 Base) MCG/ACT inhaler [id number]  INHALE 1-2 PUFFS INTO THE LUNGS EVERY 4 (FOUR) HOURS AS NEEDED FOR WHEEZING OR SHORTNESS OF BREATH. Wendie Agreste, MD  Active   aspirin EC 81 MG tablet [id number] Yes Take 81 mg by mouth daily. Swallow whole. [provider] Taking Active   Blood Glucose Monitoring Suppl (RELION PREMIER BLU MONITOR) DEVI [id number]   1 Device by Does not apply route daily. [name], MD  Active Self           Med Note Louretta Shorten, APRIL   Tue May 27, 2020  3:11 PM)    COVID-19 mRNA bivalent vaccine, Moderna, (MODERNA COVID-19 BIVAL BOOSTER) 50 MCG/0.5ML injection [id number]  Inject into the muscle.   Active   fluticasone (FLONASE) 50 MCG/ACT nasal spray [id number]  Place 1-2 sprays into both nostrils daily. Wendie Agreste, MD  Active   glipiZIDE (GLUCOTROL XL) 5 MG 24 hr tablet [id number] Yes Take 1 tablet (5 mg total) by mouth 2 (two) times daily. [name], MD Taking Active  glucose blood test strip [id number]  Use as instructed to check sugar 2 times daily [name], MD  Active Self           Med Note Louretta Shorten, APRIL   Tue May 27, 2020  3:11 PM)    hydrALAZINE (APRESOLINE) 50 MG tablet [id number] Yes Take 1 tablet (50 mg total) by mouth 3 (three) times daily. Belva Crome, MD Taking Active   hydrochlorothiazide (HYDRODIURIL) 25 MG tablet [id number] Yes TAKE 1 TABLET BY MOUTH EVERY DAY Belva Crome, MD Taking Active   isosorbide mononitrate (IMDUR) 60 MG 24 hr tablet [id number] No isosorbide mononitrate ER 60 mg tablet,extended release 24 hr  TAKE 1 TABLET BY MOUTH EVERY DAY  Patient not taking: Reported on 01/27/2021   [provider] Not Taking Active   losartan (COZAAR) 100 MG tablet [id number] Yes TAKE 1 TABLET BY MOUTH EVERY DAY Belva Crome, MD Taking Active   metFORMIN (GLUCOPHAGE) 500 MG tablet [id number] Yes Take 2 tablets (1,000 mg total) by mouth 2 (two) times daily with a meal. [name], MD Taking Active   metoprolol tartrate (LOPRESSOR) 50 MG tablet [id number] Yes TAKE 1 TABLET BY MOUTH TWICE A DAY Belva Crome, MD Taking Active   nitroGLYCERIN (NITROSTAT) 0.4 MG SL tablet [id number]  PLACE 1 TABLET UNDER THE TONGUE EVERY 5 MIN X 3 DOSES AS NEEDED FOR CHEST PAIN Belva Crome, MD  Active   pravastatin (PRAVACHOL) 40 MG tablet [id number]  Take 80 mg by mouth daily.  [provider]  Active   Semaglutide,0.25 or 0.5MG/DOS, (OZEMPIC, 0.25 OR 0.5 MG/DOSE,) 2 MG/1.5ML SOPN [id number] Yes Inject 0.5 mg into the skin once a week. [name], MD Taking Active   sertraline (ZOLOFT) 25 MG tablet [id number] Yes Take 1 tablet (25 mg total) by mouth daily. Wendie Agreste, MD Taking Active   spironolactone (ALDACTONE) 25 MG tablet [id number] No spironolactone 25 mg tablet  TAKE 1/2 TABLET BY MOUTH EVERY DAY  Patient not taking: Reported on 01/27/2021   [provider] Not Taking Active             Patient Active Problem List   Diagnosis Date Noted   Ceruminosis, bilateral 04/01/2020   Acute non-ST elevation myocardial infarction (NSTEMI) ([place]) 05/04/2019   Statin myopathy 04/04/2019   Statin intolerance -weakness, stiffness 02/13/2019   Rupture of right supraspinatus tendon 01/18/2019   Complex tear of medial meniscus of left knee 09/14/2018   Malignant neoplasm of prostate ([place]) 01/04/2018   Refusal of blood transfusions as patient is Jehovah's Witness    Pneumonia    Osteoarthritis    Obstructive sleep apnea    Hyperlipidemia    HTN (hypertension)    GERD (gastroesophageal reflux disease)    Depression    Coronary artery disease    Chronic lower back pain    Chest pain    Arthritis    Anemia    Benign essential HTN 01/03/2017   PAT (paroxysmal atrial tachycardia) ([place]) 10/27/2015   Type 2 diabetes mellitus with circulatory disorder, without long-term current use of insulin ([place]) 06/19/2015   Angina pectoris ([place])    Elevated PSA 12/20/2014   CAD (coronary artery disease)    Obstructive sleep apnea 09/06/2014   Personal history of colonic polyps 12/13/2013   Blood in stool 12/13/2013   Prostatitis, acute 12/13/2013   Hematochezia 12/13/2013   Insomnia 04/19/2013   Obesity with body mass index of 30.0-39.9 10/18/2012   Right lumbar radiculitis  09/29/2011   Adenomatous colon polyp 04/05/2009   ORGANIC IMPOTENCE  11/11/2008   Hyperlipidemia LDL goal <70 08/23/2008   GERD 08/23/2008   BPH (benign prostatic hypertrophy) with urinary obstruction 08/23/2008   OSTEOARTHRITIS 08/23/2008    Immunization History  Administered Date(s) Administered   Fluad Quad(high Dose 65+) 02/13/2019, 12/28/2019, 12/05/2020   Influenza Whole 02/04/2011   Influenza,inj,Quad PF,6+ Mos 12/18/2012, 01/24/2014, 03/31/2018   Moderna Covid-19 Vaccine Bivalent Booster 90yrs & up 01/06/2021   Moderna SARS-COV2 Booster Vaccination 02/20/2020   Moderna Sars-Covid-2 Vaccination 05/09/2019, 06/06/2019   Pneumococcal Conjugate-13 05/04/2018   Pneumococcal Polysaccharide-23 08/23/2008, 02/13/2019   Td 08/23/2008    Conditions to be addressed/monitored: HLD HTN DM PVD NSTEMI  Care Plan : ccm pharmacy care plan  Updates made by Madelin Rear, [facility] since 01/28/2021 12:00 AM     Problem: HLD HTN DM PVD NSTEMI      Long-Range Goal: disease management   Start Date: 01/28/2021  Expected End Date: 01/28/2022  This Visit's Progress: On track  Priority: High  Note:    Pharmacist Clinical Goal(s):  Patient will verbalize ability to afford treatment regimen through collaboration with PharmD and provider.   Interventions: 1:1 collaboration with Wendie Agreste, MD regarding development and update of comprehensive plan of care as evidenced by provider attestation and co-signature Inter-disciplinary care team collaboration (see longitudinal plan of care) Comprehensive medication review performed; medication list updated in electronic medical record  Hyperlipidemia: (LDL goal < 100) -Controlled -Current treatment: Pravastatin 80 mg once daily - reports to have started back on and is working towards taking consistently -Medications previously tried: intolerance to statins reported - would like to attempt routine pravastatin. -Current dietary patterns: low salt, minimal processed foods  -Current exercise habits: no routine  exercise -Educated on Cholesterol goals;  Benefits of statin for ASCVD risk reduction; -Counseled on diet and exercise extensively Offered support if Repatha or Praluent financial support may be needed in future Depression/Anxiety (Goal: minimize symptoms) -Controlled -Current treatment: Sertraline 25 mg once daily -Medications previously tried/failed: n/a -PHQ9: 14  -Declined behavioral health support at this time  -Educated on Benefits of medication for symptom control -Sertraline 25 mg increase to 50 mg once daily HC 1 month to review tolerability, phq 6-8 weeks  Diabetes (A1c goal <7%) -Controlled -Current medications: Metformin 500 mg 2 tablets by mouth twice daily Glipizide XL 5 mg twice daily  Ozempic 0.5 mg once weekly (had been taking 0.25 mg, will change to 0.5 mg 10/25)  -Medications previously tried: Levemir  -Current home glucose readings fasting glucose: 120s post prandial glucose: n/a -Denies hypoglycemic/hyperglycemic symptoms -Current meal patterns:  -Current exercise: previous would ride bicycle but limited by knee pain, wanting to get back to the gym now as next step -Educated on A1c and blood sugar goals; Exercise goal of 150 minutes per week; -Counseled to check feet daily and get yearly eye exams -Recommended to continue current medication Will message endocrinology to for metformin consolidation to 1000 mg ir bid   Hypertension (BP goal <130/80) -Not ideally controlled -Current treatment: Hydralazine 50 mg three times daily  Losartan 100 mg once daily Hydrochlorothiazide 25 mg once daily  -Medications previously tried: isosorbide mononitrate (headache), amlodipine (swelling), spironolactone (never took) -Current home readings: [id number] systolic  -Denies hypotensive/hypertensive symptoms -Educated on BP goals and benefits of medications for prevention of heart attack, stroke and kidney damage; Exercise goal of 150 minutes per week; -Counseled to  monitor BP at home 1-2x/wk, document, and provide log  at future appointments -Recommended to continue current medication  Patient Goals/Self-Care Activities Patient will:  - take medications as prescribed  Medication Assistance: None required.  Patient affirms current coverage meets needs.     Patient's preferred pharmacy is:  [id number] [place]Phone: [phone]: [phone] [facility] [place]: [phone]: [phone] Follow Up:  Patient agrees to Care Plan and Follow-up. Plan: HC 1 month DM/BP call, check on daily pravastatin and exercise. Pharmacist 3 month f/u call.   Future Appointments  Date Time [place] 03/12/2021  9:00 AM [facility] None  03/18/2021 11:15 AM BHollice Espy MD [facility] None  04/07/2021  2:00 PM GPhilemon Kingdom MD [facility] None  06/04/2021  1:20 PM GWendie Agreste MD [facility] [place] PharmD, CPP Clinical Pharmacist Practitioner  [place] ([phone]

## 2021-01-28 MED ORDER — SERTRALINE HCL 50 MG PO TABS: 50.0000 mg | ORAL_TABLET | Freq: Every day | ORAL | 1 refills | Status: DC

## 2021-01-28 NOTE — Patient Instructions (Signed)
[name],  Thank you for talking with me today. I have included our care plan/goals in the following pages.   Please review and call me at [phone] with any questions.  Thanks! Ellin Mayhew, PharmD, CPP Clinical Pharmacist Practitioner  [phone]  Care Plan : ccm pharmacy care plan  Updates made by Madelin Rear, [facility] since 01/28/2021 12:00 AM     Problem: HLD HTN DM PVD NSTEMI      Long-Range Goal: disease management   Start Date: 01/28/2021  Expected End Date: 01/28/2022  This Visit's Progress: On track  Priority: High  Note:    Pharmacist Clinical Goal(s):  Patient will verbalize ability to afford treatment regimen through collaboration with PharmD and provider.   Interventions: 1:1 collaboration with Wendie Agreste, MD regarding development and update of comprehensive plan of care as evidenced by provider attestation and co-signature Inter-disciplinary care team collaboration (see longitudinal plan of care) Comprehensive medication review performed; medication list updated in electronic medical record  Hyperlipidemia: (LDL goal < 100) -Controlled -Current treatment: Pravastatin 80 mg once daily - reports to have started back on and is working towards taking consistently -Medications previously tried: intolerance to statins reported - would like to attempt routine pravastatin. -Current dietary patterns: low salt, minimal processed foods  -Current exercise habits: no routine exercise -Educated on Cholesterol goals;  Benefits of statin for ASCVD risk reduction; -Counseled on diet and exercise extensively Offered support if Repatha or Praluent financial support may be needed in future Depression/Anxiety (Goal: minimize symptoms) -Controlled -Current treatment: Sertraline 25 mg once daily -Medications previously tried/failed: n/a -PHQ9: 14  -Declined behavioral health support at this time  -Educated on Benefits of medication for symptom  control -Sertraline 25 mg increase to 50 mg once daily HC 1 month to review tolerability, phq 6-8 weeks  Diabetes (A1c goal <7%) -Controlled -Current medications: Metformin 500 mg 2 tablets by mouth twice daily Glipizide XL 5 mg twice daily  Ozempic 0.5 mg once weekly (had been taking 0.25 mg, will change to 0.5 mg 10/25)  -Medications previously tried: Levemir  -Current home glucose readings fasting glucose: 120s post prandial glucose: n/a -Denies hypoglycemic/hyperglycemic symptoms -Current meal patterns:  -Current exercise: previous would ride bicycle but limited by knee pain, wanting to get back to the gym now as next step -Educated on A1c and blood sugar goals; Exercise goal of 150 minutes per week; -Counseled to check feet daily and get yearly eye exams -Recommended to continue current medication Will message endocrinology to for metformin consolidation to 1000 mg ir bid   Hypertension (BP goal <130/80) -Not ideally controlled -Current treatment: Hydralazine 50 mg three times daily  Losartan 100 mg once daily Hydrochlorothiazide 25 mg once daily  -Medications previously tried: isosorbide mononitrate (headache), amlodipine (swelling), spironolactone (never took) -Current home readings: 532D-924Q systolic  -Denies hypotensive/hypertensive symptoms -Educated on BP goals and benefits of medications for prevention of heart attack, stroke and kidney damage; Exercise goal of 150 minutes per week; -Counseled to monitor BP at home 1-2x/wk, document, and provide log at future appointments -Recommended to continue current medication  Patient Goals/Self-Care Activities Patient will:  - take medications as prescribed  Medication Assistance: None required.  Patient affirms current coverage meets needs.    The patient was given the following information about Chronic Care Management services today, agreed to services, and gave verbal consent: 1. CCM service includes personalized  support from designated clinical staff supervised by the primary care provider, including individualized  plan of care and coordination with other care providers 2. 24/7 contact phone numbers for assistance for urgent and routine care needs. 3. Service will only be billed when office clinical staff spend 20 minutes or more in a month to coordinate care. 4. Only one practitioner may furnish and bill the service in a calendar month. 5.The patient may stop CCM services at any time (effective at the end of the month) by phone call to the office staff. 6. The patient will be responsible for cost sharing (co-pay) of up to 20% of the service fee (after annual deductible is met). Patient agreed to services and consent obtained.  The patient verbalized understanding of instructions provided today and agreed to receive a MyChart copy of patient instruction and/or educational materials. Telephone follow up appointment with pharmacy team member scheduled for: See next appointment with "Care Management Staff" under "What's Next" below.  High Cholesterol High cholesterol is a condition in which the blood has high levels of a white, waxy substance similar to fat (cholesterol). The liver makes all the cholesterol that the body needs. The human body needs small amounts of cholesterol to help build cells. A person gets extra or excess cholesterol from the food that he or she eats. The blood carries cholesterol from the liver to the rest of the body. If you have high cholesterol, deposits (plaques) may build up on the walls of your arteries. Arteries are the blood vessels that carry blood away from your heart. These plaques make the arteries narrow and stiff. Cholesterol plaques increase your risk for heart attack and stroke. Work with your health care provider to keep your cholesterol levels in a healthy range. What increases the risk? The following factors may make you more likely to develop this condition: Eating foods that  are high in animal fat (saturated fat) or cholesterol. Being overweight. Not getting enough exercise. A family history of high cholesterol (familial hypercholesterolemia). Use of tobacco products. Having diabetes. What are the signs or symptoms? In most cases, high cholesterol does not usually cause any symptoms. In severe cases, very high cholesterol levels can cause: Fatty bumps under the skin (xanthomas). A white or gray ring around the black center (pupil) of the eye. How is this diagnosed? This condition may be diagnosed based on the results of a blood test. If you are older than 71 years of age, your health care provider may check your cholesterol levels every 4-6 years. You may be checked more often if you have high cholesterol or other risk factors for heart disease. The blood test for cholesterol measures: "Bad" cholesterol, or LDL cholesterol. This is the main type of cholesterol that causes heart disease. The desired level is less than 100 mg/dL (2.59 mmol/L). "Good" cholesterol, or HDL cholesterol. HDL helps protect against heart disease by cleaning the arteries and carrying the LDL to the liver for processing. The desired level for HDL is 60 mg/dL (1.55 mmol/L) or higher. Triglycerides. These are fats that your body can store or burn for energy. The desired level is less than 150 mg/dL (1.69 mmol/L). Total cholesterol. This measures the total amount of cholesterol in your blood and includes LDL, HDL, and triglycerides. The desired level is less than 200 mg/dL (5.17 mmol/L). How is this treated? Treatment for high cholesterol starts with lifestyle changes, such as diet and exercise. Diet changes. You may be asked to eat foods that have more fiber and less saturated fats or added sugar. Lifestyle changes. These may include regular  exercise, maintaining a healthy weight, and quitting use of tobacco products. Medicines. These are given when diet and lifestyle changes have not worked.  You may be prescribed a statin medicine to help lower your cholesterol levels. Follow these instructions at home: Eating and drinking  Eat a healthy, balanced diet. This diet includes: Daily servings of a variety of fresh, frozen, or canned fruits and vegetables. Daily servings of whole grain foods that are rich in fiber. Foods that are low in saturated fats and trans fats. These include poultry and fish without skin, lean cuts of meat, and low-fat dairy products. A variety of fish, especially oily fish that contain omega-3 fatty acids. Aim to eat fish at least 2 times a week. Avoid foods and drinks that have added sugar. Use healthy cooking methods, such as roasting, grilling, broiling, baking, poaching, steaming, and stir-frying. Do not fry your food except for stir-frying. If you drink alcohol: Limit how much you have to: 0-1 drink a day for women who are not pregnant. 0-2 drinks a day for men. Know how much alcohol is in a drink. In the [place] one drink equals one 12 oz bottle of beer (355 mL), one 5 oz glass of wine (148 mL), or one 1 oz glass of hard liquor (44 mL). Lifestyle  Get regular exercise. Aim to exercise for a total of 150 minutes a week. Increase your activity level by doing activities such as gardening, walking, and taking the stairs. Do not use any products that contain nicotine or tobacco. These products include cigarettes, chewing tobacco, and vaping devices, such as e-cigarettes. If you need help quitting, ask your health care provider. General instructions Take over-the-counter and prescription medicines only as told by your health care provider. Keep all follow-up visits. This is important. Where to find more information American Heart Association: www.heart.org National Heart, Lung, and Blood Institute: https://wilson-eaton.com/ Contact a health care provider if: You have trouble achieving or maintaining a healthy diet or weight. You are starting an exercise program. You  are unable to stop smoking. Get help right away if: You have chest pain. You have trouble breathing. You have discomfort or pain in your jaw, neck, back, shoulder, or arm. You have any symptoms of a stroke. "BE FAST" is an easy way to remember the main warning signs of a stroke: B - Balance. Signs are dizziness, sudden trouble walking, or loss of balance. E - Eyes. Signs are trouble seeing or a sudden change in vision. F - Face. Signs are sudden weakness or numbness of the face, or the face or eyelid drooping on one side. A - Arms. Signs are weakness or numbness in an arm. This happens suddenly and usually on one side of the body. S - Speech. Signs are sudden trouble speaking, slurred speech, or trouble understanding what people say. T - Time. Time to call emergency services. Write down what time symptoms started. You have other signs of a stroke, such as: A sudden, severe headache with no known cause. Nausea or vomiting. Seizure. These symptoms may represent a serious problem that is an emergency. Do not wait to see if the symptoms will go away. Get medical help right away. Call your local emergency services (911 in the [place] Do not drive yourself to the hospital. Summary Cholesterol plaques increase your risk for heart attack and stroke. Work with your health care provider to keep your cholesterol levels in a healthy range. Eat a healthy, balanced diet, get regular exercise, and maintain a  healthy weight. Do not use any products that contain nicotine or tobacco. These products include cigarettes, chewing tobacco, and vaping devices, such as e-cigarettes. Get help right away if you have any symptoms of a stroke. This information is not intended to replace advice given to you by your health care provider. Make sure you discuss any questions you have with your health care provider. Document Revised: 06/05/2020 Document Reviewed: 05/26/2020 Elsevier Patient Education  2022 [employer].

## 2021-01-29 NOTE — Progress Notes (Signed)
Called and informed patient of new dosing sent. Patient voiced understanding of directions./ls,cMA

## 2021-02-02 ENCOUNTER — Telehealth: Payer: Self-pay | Admitting: Family Medicine

## 2021-02-02 DIAGNOSIS — E1159 Type 2 diabetes mellitus with other circulatory complications: Secondary | ICD-10-CM

## 2021-02-02 DIAGNOSIS — E785 Hyperlipidemia, unspecified: Secondary | ICD-10-CM | POA: Diagnosis not present

## 2021-02-02 DIAGNOSIS — I10 Essential (primary) hypertension: Secondary | ICD-10-CM

## 2021-02-02 NOTE — Telephone Encounter (Signed)
Left message for patient to call back and schedule Medicare Annual Wellness Visit (AWV).   Please offer to do virtually or by telephone.  Left office number and my jabber [phone].  Last AWV:12/31/2019  Please schedule at anytime with Nurse Health Advisor.

## 2021-02-02 NOTE — Telephone Encounter (Signed)
Patient called back, I made an attempt to schedule appointment but he stated he would call back at a later time or to have someone call him at another time.

## 2021-02-05 ENCOUNTER — Telehealth: Payer: Self-pay

## 2021-02-05 NOTE — Telephone Encounter (Signed)
Called patient to discuss the PREVAIL trial with no answer. Message was left for a return phone call.

## 2021-02-14 ENCOUNTER — Other Ambulatory Visit: Payer: Self-pay | Admitting: Family Medicine

## 2021-02-14 DIAGNOSIS — R059 Cough, unspecified: Secondary | ICD-10-CM

## 2021-02-18 DIAGNOSIS — G4733 Obstructive sleep apnea (adult) (pediatric): Secondary | ICD-10-CM | POA: Diagnosis not present

## 2021-02-25 ENCOUNTER — Telehealth: Payer: Self-pay | Admitting: Pharmacist

## 2021-02-25 NOTE — Progress Notes (Addendum)
    Chronic Care Management Pharmacy Assistant   Name: [name]  MRN: [id number] DOB: 1950-08-14   Reason for Encounter: Follow up [place] Call  Per CPP on 01/28/21 DM HTN, see how patient is doing on sertraline dose increase to 50 mg in November. Plan for phq December 2022.  I called patient and spoke to patient about Sertraline dosing. He reported he had not increased to 50 mg yet but felt he was doing well. He will begin to increase to 50 mg as of tomorrow and call if any concerns or side effects.    Jobe Gibbon, [facility] Clinical Pharmacist Assistant  [phone]

## 2021-03-12 ENCOUNTER — Other Ambulatory Visit: Payer: Self-pay

## 2021-03-16 ENCOUNTER — Telehealth: Payer: Self-pay

## 2021-03-16 DIAGNOSIS — Z006 Encounter for examination for normal comparison and control in clinical research program: Secondary | ICD-10-CM

## 2021-03-16 NOTE — Telephone Encounter (Signed)
Message sent via [place]. Will continue to try and follow up with the patient

## 2021-03-18 ENCOUNTER — Ambulatory Visit: Payer: Self-pay | Admitting: Urology

## 2021-03-31 DIAGNOSIS — G4733 Obstructive sleep apnea (adult) (pediatric): Secondary | ICD-10-CM | POA: Diagnosis not present

## 2021-04-01 ENCOUNTER — Other Ambulatory Visit: Payer: Self-pay | Admitting: *Deleted

## 2021-04-01 DIAGNOSIS — C61 Malignant neoplasm of prostate: Secondary | ICD-10-CM

## 2021-04-07 ENCOUNTER — Encounter: Payer: Self-pay | Admitting: Internal Medicine

## 2021-04-07 ENCOUNTER — Other Ambulatory Visit: Payer: Self-pay

## 2021-04-07 ENCOUNTER — Ambulatory Visit (INDEPENDENT_AMBULATORY_CARE_PROVIDER_SITE_OTHER): Payer: Federal, State, Local not specified - PPO | Admitting: Internal Medicine

## 2021-04-07 VITALS — BP 128/84 | HR 72 | Ht 70.0 in | Wt 257.4 lb

## 2021-04-07 DIAGNOSIS — E1159 Type 2 diabetes mellitus with other circulatory complications: Secondary | ICD-10-CM | POA: Diagnosis not present

## 2021-04-07 DIAGNOSIS — E669 Obesity, unspecified: Secondary | ICD-10-CM | POA: Diagnosis not present

## 2021-04-07 DIAGNOSIS — E785 Hyperlipidemia, unspecified: Secondary | ICD-10-CM

## 2021-04-07 LAB — POCT GLYCOSYLATED HEMOGLOBIN (HGB A1C): Hemoglobin A1C: 7 % — AB (ref 4.0–5.6)

## 2021-04-07 NOTE — Patient Instructions (Addendum)
Please continue: - Metformin 1000 mg 2x a day - Glipizide XL 5 mg 2x a day - break/crush the evening dose - Ozempic 0.5 mg weekly  Check sugars at different times of the day.  Please come back for a follow-up appointment in 4 months.

## 2021-04-07 NOTE — Progress Notes (Signed)
Patient ID: [name], male   DOB: 1951/03/07, 71 y.o.   MRN: [id number]  This visit occurred during the SARS-CoV-2 public health emergency.  Safety protocols were in place, including screening questions prior to the visit, additional usage of staff PPE, and extensive cleaning of exam room while observing appropriate contact time as indicated for disinfecting solutions.   HPI: [name] is a 71 y.o.-year-old male, returning for f/u for DM2, dx 2007, insulin-dependent since 2014, uncontrolled, with complications (CAD - s/p NSTEMI 11/2014 - stent; ED; mild CKD). Last visit 7 months ago. PCP: Dr Carlota Raspberry  Interim history: No increased urination, blurry vision, nausea, chest pain. His dose of Zoloft was increased since last OV.  Reviewed HbA1c levels: Lab Results  Component Value Date   HGBA1C 7.0 (H) 12/05/2020   HGBA1C 6.6 (A) 09/09/2020   HGBA1C 6.8 (A) 01/31/2020   Pt is on a regimen of: - Metformin 1000 mg 2x a day - Glipizide XL 5 mg 2x a day -crushes the evening dose - Ozempic 0.5 mg weekly >> GERD >> restarted 08/2019 at 0.25 mg weekly >> 0.5 mg again in lat 2 months He came off Levemir 20 units at bedtime before our visit in 09/2020 after he ran out and could not refill it He tried Toujeo 20 units at night but did not like it He did not start Januvia as suggested at last visit ("I don't think I need it"). He was Levemir 140 units in am >> decreased to 70 units>> stopped as this was too expensive for him He stopped Welchol 3.7g at bedtime a day. Stopped Farxiga 10 mg b/c frequent urination and urgency. He was on Glipizide in the past. He was on Metformin in the past >> no N/V.   Pt checks his sugars once a day: - am:  150 >> 130-140 >> 98-100 >> 120-130 >> 90-120 >> 98-130 - 2h after b'fast: n/c >> 128, 134 >> n/c - before lunch: n/c >> 140s >> n/c >> 100-120 >> n/c - 2h after lunch: n/c >> 140-157 >> n/c >> 130s >> n/c - before dinner:  106-120 >> n/c >> 80-130 >>  n/c - 2h after dinner: 200-245 >> 150-180, 200 >> n/c >> up to 160s - bedtime:   180-200 >> 230-240 >> 137-140 >> 150 >> n/c - nighttime:94, 190-305 >> 148-216 >> n/c  Lowest sugar was 23!!! when took Ozempic every day by mistake >> 101 >> 90 >> 98; he has hypoglycemia awareness in the 80s Highest sugar was 200 x1 >> 170 >> 175.  -+ Mild CKD, last BUN/creatinine:  Lab Results  Component Value Date   BUN 19 12/05/2020   CREATININE 1.04 12/05/2020  09/07/2016: 13/0.9, glucose 133 On losartan.  -+ HL; last set of lipids: Lab Results  Component Value Date   CHOL 197 12/05/2020   HDL 38.80 (L) 12/05/2020   LDLCALC 139 (H) 12/05/2020   LDLDIRECT 131 (H) 03/22/2019   TRIG 93.0 12/05/2020   CHOLHDL 5 12/05/2020  09/07/2016: 167/65/33/121 He is on pravastatin inconsistently - every 2-3 days.  Previous muscle weakness with statins. He stopped Repatha due to muscle weakness.  - last eye exam was in 04/02/2020: No DR; Dr. Lady Gary.  Has appointment coming up next week.  - He denies  numbness and tingling in his feet.  Pt was admitted for CP in 11/2014: NSTEMI >> CAD >> PTCA >> stent placed.  He was in ED with CP 05/2017 >> no cardiac pathology.  He had shoulder surgery 01/18/2019 after rupture of his supraspinatus tendon. In 2020, he had radiotherapy for his prostate cancer (with gold beads).    ROS: + see HPI + acid reflux  I reviewed pt's medications, allergies, PMH, social hx, family hx, and changes were documented in the history of present illness. Otherwise, unchanged from my initial visit note.  Past Medical History:  Diagnosis Date   Adenomatous colon polyp 2011   Anemia    Angina pectoris ([place]) 05/01/2015   med rx for 95% OM (u/a to access due to tortuosity), 95% D1 and other moderate CAD at cath   Arthritis    "back, knees" (05/01/2015)   Chest pain    pleuritic   Chronic lower back pain    Complex tear of medial meniscus of left knee 09/14/2018   Coronary artery  disease    Depression    GERD (gastroesophageal reflux disease)    HTN (hypertension)    Hyperlipidemia    Obstructive sleep apnea    noncompliant with CPAP   Osteoarthritis    Pneumonia ~ 2014 X 1   Prostate cancer ([place])    Refusal of blood transfusions as patient is Jehovah's Witness    Rotator cuff arthropathy, right    Rupture of right supraspinatus tendon 01/18/2019   Type II or unspecified type diabetes mellitus without mention of complication, not stated as uncontrolled    Past Surgical History:  Procedure Laterality Date   CARDIAC CATHETERIZATION  2004   patent coronary arteries   CARDIAC CATHETERIZATION  05/01/2015   "tried to put stent in but couldn't"   CARDIAC CATHETERIZATION N/A 05/01/2015   Procedure: Left Heart Cath and Coronary Angiography;  Surgeon: Belva Crome, MD; LAD 60%, oD1 90%, pD1 70%, D2 70%, CFX patent stent, 30% distal to prev stent, pRCA 20%, OM1 90/95%, NL LV   CARDIAC CATHETERIZATION N/A 05/01/2015   Procedure: Coronary Stent Intervention;  Surgeon: Belva Crome, MD;  Unsuccessful PCI OM due to tortuosity   CARDIAC CATHETERIZATION N/A 11/06/2014   Procedure: Left Heart Cath and Coronary Angiography;  Surgeon: Belva Crome, MD;  Location: [facility];  Service: Cardiovascular;  Laterality: N/A;   CLOSED REDUCTION SHOULDER DISLOCATION Right ~ 1975   "& reattached muscle"   COLONOSCOPY W/ BIOPSIES AND POLYPECTOMY  X 2   ESOPHAGOGASTRODUODENOSCOPY ENDOSCOPY     KNEE ARTHROSCOPY WITH MEDIAL MENISECTOMY Left 09/14/2018   Procedure: LEFT KNEE ARTHROSCOPY CHONDROPLASY,  WITH MEDIAL MENISECTOMY;  Surgeon: Marchia Bond, MD;  Location: [facility];  Service: Orthopedics;  Laterality: Left;   PILONIDAL CYST EXCISION     SHOULDER ARTHROSCOPY WITH DEBRIDEMENT AND BICEP TENDON REPAIR Right 01/18/2019   Procedure: RIGHT SHOULDER ARTHROSCOPY WITH DEBRIDEMENT AND DECOMPRSSION SUBACROMIAL PARTIAL ACROMIOPLASTY;  Surgeon: Marchia Bond, MD;   Location: [facility];  Service: Orthopedics;  Laterality: Right;   SHOULDER ARTHROSCOPY WITH ROTATOR CUFF REPAIR Right 01/18/2019   Procedure: SHOULDER ARTHROSCOPY WITH ROTATOR CUFF REPAIR;  Surgeon: Marchia Bond, MD;  Location: [place] [facility];  Service: Orthopedics;  Laterality: Right;   Social History   Socioeconomic History   Marital status: Married    Spouse name: [name]   Number of children: 4   Years of education: Not on file   Highest education level: High school graduate  Occupational History   Occupation: Retired    Fish farm manager: UNEMPLOYED  Tobacco Use   Smoking status: Former    Packs/day: 0.50    Years: 10.00  Pack years: 5.00    Types: Cigarettes    Quit date: 04/06/1983    Years since quitting: 38.0   Smokeless tobacco: Never  Vaping Use   Vaping Use: Never used  Substance and Sexual Activity   Alcohol use: Yes    Alcohol/week: 0.0 standard drinks    Comment: `/26/2017 "might drink a beer q couple months mostly; summertime I might drink 2-3 beers/week"   Drug use: No   Sexual activity: Not Currently  Other Topics Concern   Not on file  Social History Narrative   Lives in [place] with spouse Shanon Becvar).  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   Social Determinants of Health   Financial Resource Strain: Not on file  Food Insecurity: Not on file  Transportation Needs: Not on file  Physical Activity: Not on file  Stress: Not on file  Social Connections: Not on file  Intimate Partner Violence: Not on file   Current Outpatient Medications on File Prior to Visit  Medication Sig Dispense Refill   albuterol (VENTOLIN HFA) 108 (90 Base) MCG/ACT inhaler INHALE 1-2 PUFFS INTO THE LUNGS EVERY 4 HOURS AS NEEDED FOR WHEEZING OR SHORTNESS OF BREATH. 18 each 2   aspirin EC 81 MG tablet Take 81 mg by mouth daily. Swallow whole.     Blood Glucose Monitoring Suppl (RELION PREMIER BLU  MONITOR) DEVI 1 Device by Does not apply route daily. 1 Device 0   COVID-19 mRNA bivalent vaccine, Moderna, (MODERNA COVID-19 BIVAL BOOSTER) 50 MCG/0.5ML injection Inject into the muscle. 0.5 mL 0   fluticasone (FLONASE) 50 MCG/ACT nasal spray Place 1-2 sprays into both nostrils daily. 48 mL 2   glipiZIDE (GLUCOTROL XL) 5 MG 24 hr tablet Take 1 tablet (5 mg total) by mouth 2 (two) times daily. 180 tablet 3   glucose blood test strip Use as instructed to check sugar 2 times daily 200 each 5   hydrALAZINE (APRESOLINE) 50 MG tablet Take 1 tablet (50 mg total) by mouth 3 (three) times daily. 270 tablet 3   hydrochlorothiazide (HYDRODIURIL) 25 MG tablet TAKE 1 TABLET BY MOUTH EVERY DAY 90 tablet 3   isosorbide mononitrate (IMDUR) 60 MG 24 hr tablet isosorbide mononitrate ER 60 mg tablet,extended release 24 hr  TAKE 1 TABLET BY MOUTH EVERY DAY (Patient not taking: Reported on 01/27/2021)     losartan (COZAAR) 100 MG tablet TAKE 1 TABLET BY MOUTH EVERY DAY 90 tablet 3   metFORMIN (GLUCOPHAGE) 500 MG tablet Take 2 tablets (1,000 mg total) by mouth 2 (two) times daily with a meal. 360 tablet 3   metoprolol tartrate (LOPRESSOR) 50 MG tablet TAKE 1 TABLET BY MOUTH TWICE A DAY 180 tablet 1   nitroGLYCERIN (NITROSTAT) 0.4 MG SL tablet PLACE 1 TABLET UNDER THE TONGUE EVERY 5 MIN X 3 DOSES AS NEEDED FOR CHEST PAIN 75 tablet 2   pravastatin (PRAVACHOL) 40 MG tablet Take 80 mg by mouth daily.     Semaglutide,0.25 or 0.5MG /DOS, (OZEMPIC, 0.25 OR 0.5 MG/DOSE,) 2 MG/1.5ML SOPN Inject 0.5 mg into the skin once a week. 4.5 mL 3   sertraline (ZOLOFT) 50 MG tablet Take 1 tablet (50 mg total) by mouth daily. 90 tablet 1   spironolactone (ALDACTONE) 25 MG tablet spironolactone 25 mg tablet  TAKE 1/2 TABLET BY MOUTH EVERY DAY (Patient not taking: Reported on 01/27/2021)     No current facility-administered medications on file prior to visit.  Allergies  Allergen Reactions   Statins Other (See Comments)    REACTION:  joint pain Lipitor- headaches Has also tried Livalo, pravachol, zetia, crestor, welchol   Imdur [Isosorbide Dinitrate]     Headache - "violent"   Family History  Problem Relation Age of Onset   Coronary artery disease Other        CABG   Coronary artery disease Mother    Stroke Mother    Heart disease Mother    Diabetes Mother    Other Father    Diabetes Sister    Diabetes Maternal Uncle        x2   Alcohol abuse Other    Diabetes Paternal Uncle        x2   Colon cancer Paternal Uncle    Colon polyps Neg Hx    Esophageal cancer Neg Hx     PE: BP 128/84 (BP Location: Right Arm, Patient Position: Sitting, Cuff Size: Normal)    Pulse 72    Ht 5\' 10"  (1.778 m)    Wt 257 lb 6.4 oz (116.8 kg)    SpO2 96%    BMI 36.93 kg/m   Wt Readings from Last 3 Encounters:  04/07/21 257 lb 6.4 oz (116.8 kg)  12/10/20 254 lb 9.6 oz (115.5 kg)  12/05/20 255 lb 3.2 oz (115.8 kg)   Constitutional: overweight, in NAD Eyes: PERRLA, EOMI, no exophthalmos ENT: moist mucous membranes, no thyromegaly, no cervical lymphadenopathy Cardiovascular: RRR, No MRG Respiratory: CTA B Musculoskeletal: no deformities, strength intact in all 4 Skin: moist, warm, no rashes Neurological: no tremor with outstretched hands, DTR normal in all 4  ASSESSMENT: 1. DM2, insulin-dependent, uncontrolled, with complications - CAD, s/p NSTEMI, s/p stent 11/2014 - ED - mild CKD  2. Obesity class II BMI Classification: < 18.5 underweight  18.5-24.9 normal weight  25.0-29.9 overweight  30.0-34.9 class I obesity  35.0-39.9 class II obesity  ? 40.0 class III obesity   3. HL  PLAN:  1. Patient with uncontrolled type 2 diabetes, on oral antidiabetic regimen with metformin, sulfonylurea, and also weekly GLP-1 receptor agonist.  Before last visit, he came off long-acting insulin as he could not refill this, but sugars remained controlled afterwards so we did not have to restart it.  At that time, sugars were at goal,  without any lows and he was tolerating Ozempic at the lower dose well.  Of note, he had GERD with a higher dose so we had to back off the dose.  Since then, he tells me that he was able to increase back to 0.5 mg weekly.  He tolerates this well now.  Most recent HbA1c increased to 7.0% 4 months ago, from 6.6% last visit. -At today's visit, sugars are at goal in the morning and also later in the day, but he is not checking consistently in the middle of the day.  I advised him to rotate his check times but for now, we did not need to change his regimen.  We will continue with a higher dose of Ozempic, 0.5 mg weekly.  At next visit, if sugars remain controlled, will start decreasing his glipizide dose. - I suggested to:  Patient Instructions  Please continue: - Metformin 1000 mg 2x a day - Glipizide XL 5 mg 2x a day - break/crush the evening dose - Ozempic 0.5 mg weekly  Check sugars at different times of the day.  Please come back for a follow-up appointment in 4 months.  - we  checked his HbA1c: 7.0% (stable) - advised to check sugars at different times of the day - 1x a day, rotating check times - advised for yearly eye exams >> he is due - coming up this week - return to clinic in 4 months  2. Obesity class 2 -He continues on Ozempic, which should also help with weight loss.  He was able to increase the dose back since last visit. -He lost 8 pounds before last visit, most likely due to stopping insulin.  We did not have to restart this.  3. HL -Reviewed latest lipid panel from 05/2020: LDL above target, the rest of fractions at goal: Lab Results  Component Value Date   CHOL 197 12/05/2020   HDL 38.80 (L) 12/05/2020   LDLCALC 139 (H) 12/05/2020   LDLDIRECT 131 (H) 03/22/2019   TRIG 93.0 12/05/2020   CHOLHDL 5 12/05/2020  -Unfortunately, he could not tolerate statins or Repatha due to muscle weakness -At last visit, however, he was telling me that he was taking pravastatin  inconsistently, but not recently.  I advised him to try to take it every second or even third day.  He is doing this now.  [name], MD PhD [facility] End[facility]

## 2021-04-07 NOTE — Addendum Note (Signed)
Addended by: Lauralyn Primes on: 04/07/2021 02:29 PM   Modules accepted: Orders

## 2021-04-08 ENCOUNTER — Telehealth: Payer: Self-pay

## 2021-04-08 NOTE — Telephone Encounter (Signed)
Called patient again about PREVAIL study with no answer. Message was left. Patient did read My Chart message on 04/02/21 with no further contact.

## 2021-04-09 ENCOUNTER — Other Ambulatory Visit: Payer: Federal, State, Local not specified - PPO

## 2021-04-09 ENCOUNTER — Other Ambulatory Visit: Payer: Self-pay

## 2021-04-09 DIAGNOSIS — C61 Malignant neoplasm of prostate: Secondary | ICD-10-CM

## 2021-04-10 LAB — PSA: Prostate Specific Ag, Serum: 0.5 ng/mL (ref 0.0–4.0)

## 2021-04-13 NOTE — Progress Notes (Signed)
04/14/21 2:58 PM   [name] 12/28/1950 [id number]  Referring provider:  Wendie Agreste, MD [address] [place],  [place] [address] Chief Complaint  Patient presents with   Prostate Cancer     HPI: [name] is a 71 y.o.male with a personal history of prostate cancer, who presents today for 6 month follow-up with PSA.   Prostate biopsy was performed on 11/15/2017.  This revealed 6 of 12 cores positive for malignancy, 3 on the left and 3 on the right.  This is primarily 3 posterior but there was a single core Gleason 3+4 involving 46% of the tissue at the left lateral mid base. iPSA 5.8  He elected to undergo IMRT with Dr. Tammi Klippel.  He completed treatment December 2019 at which time he received 48 Gray of radiation over 5-week course.   His most recent PSA on 04/09/2021 was 0.5.   He reports today that his urinary symptoms worsened when he drank soda and alcohol, he reports that he quit drinking these and has seen significant improvement. He has been having trouble with erections.   PMH: Past Medical History:  Diagnosis Date   Adenomatous colon polyp 2011   Anemia    Angina pectoris ([place]) 05/01/2015   med rx for 95% OM (u/a to access due to tortuosity), 95% D1 and other moderate CAD at cath   Arthritis    "back, knees" (05/01/2015)   Chest pain    pleuritic   Chronic lower back pain    Complex tear of medial meniscus of left knee 09/14/2018   Coronary artery disease    Depression    GERD (gastroesophageal reflux disease)    HTN (hypertension)    Hyperlipidemia    Obstructive sleep apnea    noncompliant with CPAP   Osteoarthritis    Pneumonia ~ 2014 X 1   Prostate cancer ([place] [address])    Refusal of blood transfusions as patient is Jehovah's Witness    Rotator cuff arthropathy, right    Rupture of right supraspinatus tendon 01/18/2019   Type II or unspecified type diabetes mellitus without mention of complication, not stated as uncontrolled     Surgical  History: Past Surgical History:  Procedure Laterality Date   CARDIAC CATHETERIZATION  2004   patent coronary arteries   CARDIAC CATHETERIZATION  05/01/2015   "tried to put stent in but couldn't"   CARDIAC CATHETERIZATION N/A 05/01/2015   Procedure: Left Heart Cath and Coronary Angiography;  Surgeon: Belva Crome, MD; LAD 60%, oD1 90%, pD1 70%, D2 70%, CFX patent stent, 30% distal to prev stent, pRCA 20%, OM1 90/95%, NL LV   CARDIAC CATHETERIZATION N/A 05/01/2015   Procedure: Coronary Stent Intervention;  Surgeon: Belva Crome, MD;  Unsuccessful PCI OM due to tortuosity   CARDIAC CATHETERIZATION N/A 11/06/2014   Procedure: Left Heart Cath and Coronary Angiography;  Surgeon: Belva Crome, MD;  Location: [facility];  Service: Cardiovascular;  Laterality: N/A;   CLOSED REDUCTION SHOULDER DISLOCATION Right ~ 1975   "& reattached muscle"   COLONOSCOPY W/ BIOPSIES AND POLYPECTOMY  X 2   ESOPHAGOGASTRODUODENOSCOPY ENDOSCOPY     KNEE ARTHROSCOPY WITH MEDIAL MENISECTOMY Left 09/14/2018   Procedure: LEFT KNEE ARTHROSCOPY CHONDROPLASY,  WITH MEDIAL MENISECTOMY;  Surgeon: Marchia Bond, MD;  Location: [facility];  Service: Orthopedics;  Laterality: Left;   PILONIDAL CYST EXCISION     SHOULDER ARTHROSCOPY WITH DEBRIDEMENT AND BICEP TENDON REPAIR Right 01/18/2019  Procedure: RIGHT SHOULDER ARTHROSCOPY WITH DEBRIDEMENT AND DECOMPRSSION SUBACROMIAL PARTIAL ACROMIOPLASTY;  Surgeon: Marchia Bond, MD;  Location: [place];  Service: Orthopedics;  Laterality: Right;   SHOULDER ARTHROSCOPY WITH ROTATOR CUFF REPAIR Right 01/18/2019   Procedure: SHOULDER ARTHROSCOPY WITH ROTATOR CUFF REPAIR;  Surgeon: Marchia Bond, MD;  Location: [place];  Service: Orthopedics;  Laterality: Right;    Home Medications:  Allergies as of 04/14/2021       Reactions   Statins Other (See Comments)   REACTION: joint pain Lipitor- headaches Has also tried Livalo, pravachol,  zetia, crestor, welchol   Imdur [isosorbide Dinitrate]    Headache - "violent"        Medication List        Accurate as of April 14, 2021  2:58 PM. If you have any questions, ask your nurse or doctor.          albuterol 108 (90 Base) MCG/ACT inhaler Commonly known as: VENTOLIN HFA INHALE 1-2 PUFFS INTO THE LUNGS EVERY 4 HOURS AS NEEDED FOR WHEEZING OR SHORTNESS OF BREATH.   aspirin EC 81 MG tablet Take 81 mg by mouth daily. Swallow whole.   fluticasone 50 MCG/ACT nasal spray Commonly known as: FLONASE Place 1-2 sprays into both nostrils daily.   glipiZIDE 5 MG 24 hr tablet Commonly known as: GLUCOTROL XL Take 1 tablet (5 mg total) by mouth 2 (two) times daily.   glucose blood test strip Use as instructed to check sugar 2 times daily   hydrALAZINE 50 MG tablet Commonly known as: APRESOLINE Take 1 tablet (50 mg total) by mouth 3 (three) times daily.   hydrochlorothiazide 25 MG tablet Commonly known as: HYDRODIURIL TAKE 1 TABLET BY MOUTH EVERY DAY   losartan 100 MG tablet Commonly known as: COZAAR TAKE 1 TABLET BY MOUTH EVERY DAY   metFORMIN 500 MG tablet Commonly known as: GLUCOPHAGE Take 2 tablets (1,000 mg total) by mouth 2 (two) times daily with a meal.   metoprolol tartrate 50 MG tablet Commonly known as: LOPRESSOR TAKE 1 TABLET BY MOUTH TWICE A DAY   Moderna COVID-19 Bival Booster 50 MCG/0.5ML injection Generic drug: COVID-19 mRNA bivalent vaccine (Moderna) Inject into the muscle.   nitroGLYCERIN 0.4 MG SL tablet Commonly known as: NITROSTAT PLACE 1 TABLET UNDER THE TONGUE EVERY 5 MIN X 3 DOSES AS NEEDED FOR CHEST PAIN   Ozempic (0.25 or 0.5 MG/DOSE) 2 MG/1.5ML Sopn Generic drug: Semaglutide(0.25 or 0.5MG /DOS) Inject 0.5 mg into the skin once a week.   pravastatin 40 MG tablet Commonly known as: PRAVACHOL Take 80 mg by mouth daily.   [employer] Facilities manager 1 Device by Does not apply route daily.   sertraline 50 MG  tablet Commonly known as: Zoloft Take 1 tablet (50 mg total) by mouth daily.        Allergies:  Allergies  Allergen Reactions   Statins Other (See Comments)    REACTION: joint pain Lipitor- headaches Has also tried Livalo, pravachol, zetia, crestor, welchol   Imdur [Isosorbide Dinitrate]     Headache - "violent"    Family History: Family History  Problem Relation Age of Onset   Coronary artery disease Other        CABG   Coronary artery disease Mother    Stroke Mother    Heart disease Mother    Diabetes Mother    Other Father    Diabetes Sister    Diabetes Maternal Uncle  x2   Alcohol abuse Other    Diabetes Paternal Uncle        x2   Colon cancer Paternal Uncle    Colon polyps Neg Hx    Esophageal cancer Neg Hx     Social History:  reports that he quit smoking about 38 years ago. His smoking use included cigarettes. He has a 5.00 pack-year smoking history. He has never used smokeless tobacco. He reports current alcohol use. He reports that he does not use drugs.   Physical Exam: BP (!) 155/76    Pulse 75    Ht 5\' 10"  (1.778 m)    Wt 257 lb (116.6 kg)    BMI 36.88 kg/m   Constitutional:  Alert and oriented, No acute distress. HEENT: [place] AT, moist mucus membranes.  Trachea midline, no masses. Cardiovascular: No clubbing, cyanosis, or edema. Respiratory: Normal respiratory effort, no increased work of breathing. Skin: No rashes, bruises or suspicious lesions. Neurologic: Grossly intact, no focal deficits, moving all 4 extremities. Psychiatric: Normal mood and affect.  Laboratory Data:  Lab Results  Component Value Date   CREATININE 1.04 12/05/2020    Lab Results  Component Value Date   PSA 0.90 12/05/2020   PSA 3.53 04/19/2013   PSA 2.41 09/29/2011    Lab Results  Component Value Date   TESTOSTERONE 408.15 05/20/2009    Lab Results  Component Value Date   HGBA1C 7.0 (A) 04/07/2021   Assessment & Plan:    1. Malignant neoplasm of  prostate ([place]) - Status post XRT with stably low PSA  - Discussed nadar PSA of 0.5 - PSA; Future  2. Urinary frequency  - resolved with diet changes.   3. Erectile dysfunction  - We discussed the pathophysiology of erectile dysfunction today along with possible contributing factors. Discussed possible treatment options including PDE 5 inhibitors.   In terms of PDE 5 inhibitors, we discussed contraindications for this medication as well as common side effects. Patient was counseled on optimal use. All of his questions were answered in detail. - Prescribed Sildenafil   F/u 6 months PSA only , 12 months with PSA / MD visit  Gevena Mart Littlejohn,acting as a scribe for [name], MD.,have documented all relevant documentation on the behalf of [name], MD,as directed by  [name], MD while in the presence of [name], MD.  I have reviewed the above documentation for accuracy and completeness, and I agree with the above.   [name], MD   [facility] - [facility] [address], [place], [place] [address] [phone]

## 2021-04-14 ENCOUNTER — Encounter: Payer: Self-pay | Admitting: Urology

## 2021-04-14 ENCOUNTER — Ambulatory Visit (INDEPENDENT_AMBULATORY_CARE_PROVIDER_SITE_OTHER): Payer: Federal, State, Local not specified - PPO | Admitting: Urology

## 2021-04-14 ENCOUNTER — Other Ambulatory Visit: Payer: Self-pay

## 2021-04-14 VITALS — BP 155/76 | HR 75 | Ht 70.0 in | Wt 257.0 lb

## 2021-04-14 DIAGNOSIS — C61 Malignant neoplasm of prostate: Secondary | ICD-10-CM

## 2021-04-14 MED ORDER — SILDENAFIL CITRATE 20 MG PO TABS: ORAL_TABLET | ORAL | 3 refills | Status: DC

## 2021-04-16 ENCOUNTER — Telehealth: Payer: Self-pay | Admitting: Physical Medicine and Rehabilitation

## 2021-04-16 NOTE — Telephone Encounter (Signed)
Pt called stating Dr.Newton wanted him to get a MRI in June of 2022 but it wasn't a good time so he didn't go through with it. Pt would like to get the MRI now and would like to have another referral sent in. Pt would like a CB when this has been sent to [facility].   [phone]

## 2021-05-01 DIAGNOSIS — G4733 Obstructive sleep apnea (adult) (pediatric): Secondary | ICD-10-CM | POA: Diagnosis not present

## 2021-05-05 ENCOUNTER — Telehealth: Payer: Self-pay | Admitting: Physical Medicine and Rehabilitation

## 2021-05-05 DIAGNOSIS — M542 Cervicalgia: Secondary | ICD-10-CM | POA: Diagnosis not present

## 2021-05-06 ENCOUNTER — Other Ambulatory Visit: Payer: Self-pay | Admitting: Family Medicine

## 2021-05-06 DIAGNOSIS — F32A Depression, unspecified: Secondary | ICD-10-CM

## 2021-05-08 ENCOUNTER — Ambulatory Visit
Admission: RE | Admit: 2021-05-08 | Discharge: 2021-05-08 | Disposition: A | Discharge status: Home / Self Care | Payer: Federal, State, Local not specified - PPO | Source: Ambulatory Visit | Attending: Physical Medicine and Rehabilitation | Admitting: Physical Medicine and Rehabilitation

## 2021-05-08 DIAGNOSIS — M4802 Spinal stenosis, cervical region: Secondary | ICD-10-CM | POA: Diagnosis not present

## 2021-05-08 DIAGNOSIS — M5412 Radiculopathy, cervical region: Secondary | ICD-10-CM

## 2021-05-08 DIAGNOSIS — M2578 Osteophyte, vertebrae: Secondary | ICD-10-CM | POA: Diagnosis not present

## 2021-05-11 ENCOUNTER — Telehealth: Payer: Self-pay | Admitting: Physical Medicine and Rehabilitation

## 2021-05-11 NOTE — Telephone Encounter (Signed)
Patient called. He would like an appointment with Dr. Ernestina Patches. His number is [phone]

## 2021-05-12 ENCOUNTER — Telehealth: Payer: Self-pay | Admitting: Physical Medicine and Rehabilitation

## 2021-05-12 NOTE — Telephone Encounter (Signed)
He needs f/up OV to review cervical MRI completed 2/3 or he can follow up with Dr. Retia Passe at [facility].

## 2021-05-19 ENCOUNTER — Encounter: Payer: Self-pay | Admitting: Diagnostic Neuroimaging

## 2021-05-19 ENCOUNTER — Ambulatory Visit (INDEPENDENT_AMBULATORY_CARE_PROVIDER_SITE_OTHER): Payer: Federal, State, Local not specified - PPO | Admitting: Diagnostic Neuroimaging

## 2021-05-19 VITALS — BP 193/94 | HR 71 | Ht 70.0 in | Wt 266.0 lb

## 2021-05-19 DIAGNOSIS — M542 Cervicalgia: Secondary | ICD-10-CM

## 2021-05-19 DIAGNOSIS — R413 Other amnesia: Secondary | ICD-10-CM | POA: Diagnosis not present

## 2021-05-19 DIAGNOSIS — M5481 Occipital neuralgia: Secondary | ICD-10-CM

## 2021-05-19 NOTE — Progress Notes (Signed)
[facility]  PATIENT: [name] DOB: 1950-12-09  REFERRING CLINICIAN: Wendie Agreste, MD HISTORY FROM: patient  REASON FOR VISIT: follow up   HISTORICAL  CHIEF COMPLAINT:  Chief Complaint  Patient presents with   Follow-up    Rm 6 alone here to discuss worsening neck pain. Pt has been reading on Occipital Neuralgia and feels like this could be what is causing his symptoms. Reports sx are mostly on the right side of the neck. Pt reports memory has been worse since last visit as well.     HISTORY OF PRESENT ILLNESS:   UPDATE (05/19/21, VRP): Since last visit, here for evaluation of neck pain, head pain, radiating to the right posterior scalp.  PRIOR HPI (03/10/20): 71 year old male with hypertension, diabetes, obstructive sleep apnea, depression, here for evaluation of memory loss.  For past 1 to 2 years patient has had some short-term memory loss and confusion, missing appointments, losing things, having difficulty with recent conversations.  He also had an episode where he was driving and went into a "dreamlike state and did not recognize where he was going.  No major changes in ADLs.  Wife is noted some of these issues.  Patient's mother had dementia and he is concerned about similar diagnosis.    REVIEW OF SYSTEMS: Full 14 system review of systems performed and negative with exception of: As per HPI.  ALLERGIES: Allergies  Allergen Reactions   Statins Other (See Comments)    REACTION: joint pain Lipitor- headaches Has also tried Livalo, pravachol, zetia, crestor, welchol   Imdur [Isosorbide Dinitrate]     Headache - "violent"    HOME MEDICATIONS: Outpatient Medications Prior to Visit  Medication Sig Dispense Refill   albuterol (VENTOLIN HFA) 108 (90 Base) MCG/ACT inhaler INHALE 1-2 PUFFS INTO THE LUNGS EVERY 4 HOURS AS NEEDED FOR WHEEZING OR SHORTNESS OF BREATH. 18 each 2   aspirin EC 81 MG tablet Take 81 mg by mouth daily. Swallow whole.      Blood Glucose Monitoring Suppl (RELION PREMIER BLU MONITOR) DEVI 1 Device by Does not apply route daily. 1 Device 0   COVID-19 mRNA bivalent vaccine, Moderna, (MODERNA COVID-19 BIVAL BOOSTER) 50 MCG/0.5ML injection Inject into the muscle. 0.5 mL 0   fluticasone (FLONASE) 50 MCG/ACT nasal spray Place 1-2 sprays into both nostrils daily. 48 mL 2   glipiZIDE (GLUCOTROL XL) 5 MG 24 hr tablet Take 1 tablet (5 mg total) by mouth 2 (two) times daily. 180 tablet 3   glucose blood test strip Use as instructed to check sugar 2 times daily 200 each 5   hydrALAZINE (APRESOLINE) 50 MG tablet Take 1 tablet (50 mg total) by mouth 3 (three) times daily. 270 tablet 3   hydrochlorothiazide (HYDRODIURIL) 25 MG tablet TAKE 1 TABLET BY MOUTH EVERY DAY 90 tablet 3   losartan (COZAAR) 100 MG tablet TAKE 1 TABLET BY MOUTH EVERY DAY 90 tablet 3   metFORMIN (GLUCOPHAGE) 500 MG tablet Take 2 tablets (1,000 mg total) by mouth 2 (two) times daily with a meal. 360 tablet 3   metoprolol tartrate (LOPRESSOR) 50 MG tablet TAKE 1 TABLET BY MOUTH TWICE A DAY 180 tablet 1   nitroGLYCERIN (NITROSTAT) 0.4 MG SL tablet PLACE 1 TABLET UNDER THE TONGUE EVERY 5 MIN X 3 DOSES AS NEEDED FOR CHEST PAIN 75 tablet 2   pravastatin (PRAVACHOL) 40 MG tablet Take 80 mg by mouth daily.     Semaglutide,0.25 or 0.5MG /DOS, (OZEMPIC, 0.25 OR 0.5 MG/DOSE,)  2 MG/1.5ML SOPN Inject 0.5 mg into the skin once a week. 4.5 mL 3   sertraline (ZOLOFT) 25 MG tablet TAKE 1 TABLET (25 MG TOTAL) BY MOUTH DAILY. 90 tablet 1   sertraline (ZOLOFT) 50 MG tablet Take 1 tablet (50 mg total) by mouth daily. 90 tablet 1   sildenafil (REVATIO) 20 MG tablet Take 3-5 tablets 1hr prior to intercourse 60 tablet 3   No facility-administered medications prior to visit.    PAST MEDICAL HISTORY: Past Medical History:  Diagnosis Date   Adenomatous colon polyp 2011   Anemia    Angina pectoris ([place]) 05/01/2015   med rx for 95% OM (u/a to access due to tortuosity), 95% D1 and  other moderate CAD at cath   Arthritis    "back, knees" (05/01/2015)   Chest pain    pleuritic   Chronic lower back pain    Complex tear of medial meniscus of left knee 09/14/2018   Coronary artery disease    Depression    GERD (gastroesophageal reflux disease)    HTN (hypertension)    Hyperlipidemia    Obstructive sleep apnea    noncompliant with CPAP   Osteoarthritis    Pneumonia ~ 2014 X 1   Prostate cancer ([place])    Refusal of blood transfusions as patient is Jehovah's Witness    Rotator cuff arthropathy, right    Rupture of right supraspinatus tendon 01/18/2019   Type II or unspecified type diabetes mellitus without mention of complication, not stated as uncontrolled     PAST SURGICAL HISTORY: Past Surgical History:  Procedure Laterality Date   CARDIAC CATHETERIZATION  2004   patent coronary arteries   CARDIAC CATHETERIZATION  05/01/2015   "tried to put stent in but couldn't"   CARDIAC CATHETERIZATION N/A 05/01/2015   Procedure: Left Heart Cath and Coronary Angiography;  Surgeon: Belva Crome, MD; LAD 60%, oD1 90%, pD1 70%, D2 70%, CFX patent stent, 30% distal to prev stent, pRCA 20%, OM1 90/95%, NL LV   CARDIAC CATHETERIZATION N/A 05/01/2015   Procedure: Coronary Stent Intervention;  Surgeon: Belva Crome, MD;  Unsuccessful PCI OM due to tortuosity   CARDIAC CATHETERIZATION N/A 11/06/2014   Procedure: Left Heart Cath and Coronary Angiography;  Surgeon: Belva Crome, MD;  Location: [facility];  Service: Cardiovascular;  Laterality: N/A;   CLOSED REDUCTION SHOULDER DISLOCATION Right ~ 1975   "& reattached muscle"   COLONOSCOPY W/ BIOPSIES AND POLYPECTOMY  X 2   ESOPHAGOGASTRODUODENOSCOPY ENDOSCOPY     KNEE ARTHROSCOPY WITH MEDIAL MENISECTOMY Left 09/14/2018   Procedure: LEFT KNEE ARTHROSCOPY CHONDROPLASY,  WITH MEDIAL MENISECTOMY;  Surgeon: Marchia Bond, MD;  Location: [facility];  Service: Orthopedics;  Laterality: Left;   PILONIDAL CYST EXCISION      SHOULDER ARTHROSCOPY WITH DEBRIDEMENT AND BICEP TENDON REPAIR Right 01/18/2019   Procedure: RIGHT SHOULDER ARTHROSCOPY WITH DEBRIDEMENT AND DECOMPRSSION SUBACROMIAL PARTIAL ACROMIOPLASTY;  Surgeon: Marchia Bond, MD;  Location: [place];  Service: Orthopedics;  Laterality: Right;   SHOULDER ARTHROSCOPY WITH ROTATOR CUFF REPAIR Right 01/18/2019   Procedure: SHOULDER ARTHROSCOPY WITH ROTATOR CUFF REPAIR;  Surgeon: Marchia Bond, MD;  Location: [place];  Service: Orthopedics;  Laterality: Right;    FAMILY HISTORY: Family History  Problem Relation Age of Onset   Coronary artery disease Other        CABG   Coronary artery disease Mother    Stroke Mother    Heart disease Mother  Diabetes Mother    Other Father    Diabetes Sister    Diabetes Maternal Uncle        x2   Alcohol abuse Other    Diabetes Paternal Uncle        x2   Colon cancer Paternal Uncle    Colon polyps Neg Hx    Esophageal cancer Neg Hx     SOCIAL HISTORY: Social History   Socioeconomic History   Marital status: Married    Spouse name: [name]   Number of children: 4   Years of education: Not on file   Highest education level: High school graduate  Occupational History   Occupation: Retired    Fish farm manager: UNEMPLOYED  Tobacco Use   Smoking status: Former    Packs/day: 0.50    Years: 10.00    Pack years: 5.00    Types: Cigarettes    Quit date: 04/06/1983    Years since quitting: 38.1   Smokeless tobacco: Never  Vaping Use   Vaping Use: Never used  Substance and Sexual Activity   Alcohol use: Yes    Alcohol/week: 0.0 standard drinks    Comment: `/26/2017 "might drink a beer q couple months mostly; summertime I might drink 2-3 beers/week"   Drug use: No   Sexual activity: Not Currently  Other Topics Concern   Not on file  Social History Narrative   Lives in [place] with spouse Leverett Camplin).  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   Social Determinants of Health   Financial Resource Strain: Not on file  Food Insecurity: Not on file  Transportation Needs: Not on file  Physical Activity: Not on file  Stress: Not on file  Social Connections: Not on file  Intimate Partner Violence: Not on file     PHYSICAL EXAM  GENERAL EXAM/CONSTITUTIONAL: Vitals:  Vitals:   05/19/21 1009  BP: (!) 193/94  Pulse: 71  Weight: 266 lb (120.7 kg)  Height: 5\' 10"  (1.778 m)   Body mass index is 38.17 kg/m. Wt Readings from Last 3 Encounters:  05/19/21 266 lb (120.7 kg)  04/14/21 257 lb (116.6 kg)  04/07/21 257 lb 6.4 oz (116.8 kg)   Patient is in no distress; well developed, nourished and groomed; STIFFNESS IN NECK ROM, ESP TURNING AND TILTING TO THE RIGHT  CARDIOVASCULAR: Examination of carotid arteries is normal; no carotid bruits Regular rate and rhythm, no murmurs Examination of peripheral vascular system by observation and palpation is normal  EYES: Ophthalmoscopic exam of optic discs and posterior segments is normal; no papilledema or hemorrhages No results found.  MUSCULOSKELETAL: Gait, strength, tone, movements noted in Neurologic exam below  NEUROLOGIC: MENTAL STATUS:  MMSE - Mini Mental State Exam 05/19/2021 03/10/2020  Orientation to time 5 5  Orientation to Place 5 5  Registration 3 3  Attention/ Calculation 2 1  Recall 3 2  Language- name 2 objects 2 2  Language- repeat 1 0  Language- follow 3 step command 3 3  Language- read & follow direction 1 1  Write a sentence 1 1  Copy design 1 1  Total score 27 24   awake, alert, oriented to person, place and time recent and remote memory intact normal attention and concentration language fluent, comprehension intact, naming intact fund of knowledge appropriate  CRANIAL NERVE:  2nd - no papilledema on fundoscopic exam 2nd, 3rd, 4th, 6th - pupils equal and reactive to light, visual  fields full to confrontation, extraocular muscles  intact, no nystagmus 5th - facial sensation symmetric 7th - facial strength symmetric 8th - hearing intact 9th - palate elevates symmetrically, uvula midline 11th - shoulder shrug symmetric 12th - tongue protrusion midline  MOTOR:  normal bulk and tone, full strength in the BUE, BLE  SENSORY:  normal and symmetric to light touch, temperature, vibration  COORDINATION:  finger-nose-finger, fine finger movements normal  REFLEXES:  deep tendon reflexes TRACE and symmetric  GAIT/STATION:  narrow based gait     DIAGNOSTIC DATA (LABS, IMAGING, TESTING) - I reviewed patient records, labs, notes, testing and imaging myself where available.  Lab Results  Component Value Date   WBC 6.3 04/09/2019   HGB 14.6 04/09/2019   HCT 44.8 04/09/2019   MCV 87 04/09/2019   PLT 186 04/09/2019      Component Value Date/Time   NA 141 12/05/2020 1230   NA 145 (H) 04/30/2020 1624   K 4.2 12/05/2020 1230   CL 103 12/05/2020 1230   CO2 32 12/05/2020 1230   GLUCOSE 119 (H) 12/05/2020 1230   BUN 19 12/05/2020 1230   BUN 10 04/30/2020 1624   CREATININE 1.04 12/05/2020 1230   CREATININE 1.04 11/11/2015 1058   CALCIUM 9.4 12/05/2020 1230   PROT 7.0 12/05/2020 1230   PROT 7.3 04/30/2020 1624   ALBUMIN 4.0 12/05/2020 1230   ALBUMIN 4.4 04/30/2020 1624   AST 18 12/05/2020 1230   ALT 21 12/05/2020 1230   ALKPHOS 81 12/05/2020 1230   BILITOT 0.4 12/05/2020 1230   BILITOT 0.3 04/30/2020 1624   GFRNONAA 87 04/30/2020 1624   GFRAA 100 04/30/2020 1624   Lab Results  Component Value Date   CHOL 197 12/05/2020   HDL 38.80 (L) 12/05/2020   LDLCALC 139 (H) 12/05/2020   LDLDIRECT 131 (H) 03/22/2019   TRIG 93.0 12/05/2020   CHOLHDL 5 12/05/2020   Lab Results  Component Value Date   HGBA1C 7.0 (A) 04/07/2021   Lab Results  Component Value Date   VITAMINB12 640 03/10/2020   Lab Results  Component Value Date   TSH 1.210 04/09/2019    01/16/10 CT head  - No evidence of intracranial  abnormality.   03/23/20 MRI of the brain with and without contrast shows the following: 1.   Mild generalized cortical atrophy, typical for age. 2.   T2/flair hyperintense foci consistent with mild age-related chronic microvascular ischemic change. 3.    No acute findings. 4.    Normal enhancement pattern.  05/08/21 MRI cervical spine [I reviewed images myself and agree with interpretation. Mild bilateral C2 nerve root impingement, right worse than left. -VRP]  - Degenerative spondylosis from C3-4 through C6-7. Endplate osteophytes and bulging of the disc narrows the ventral subarachnoid space but do not compress the cord. Bilateral foraminal stenosis throughout that region could affect the nerve roots on either or both sides. Compared to the study of 2013, the findings are quite similar, with slight worsening notable at C4-5 and C6-7.    ASSESSMENT AND PLAN  71 y.o. year old male here with:  Dx:  1. Memory loss   2. Neck pain   3. Occipital neuralgia of right side     PLAN:  NECK PAIN / RIGHT SHOULDER PAIN / RIGHT OCCIPITAL NEURALGIA (vs C2 radiculopathies) - follow up with pain mgmt and consider nerve blocks (Dr. Ernestina Patches) - consider PT /OT, massage therapy, acupuncture  INTERMITTENT CONFUSION / SHORT TERM MEMORY LOSS (since ~2019; h/o depression, OSA,  HTN, DM) - safety / supervision issues reviewed - daily physical activity / exercise (at least 15-30 minutes) - eat more plants / vegetables - increase social activities, brain stimulation, games, puzzles, hobbies, crafts, arts, music - aim for at least 7-8 hours sleep per night (or more) - avoid smoking and alcohol - caregiver resources provided - caution with driving and finances  Return for return to PCP.  I spent 45 minutes of face-to-face and non-face-to-face time with patient.  This included previsit chart review, lab review, study review, order entry, electronic health record documentation, patient education.      Penni Bombard, MD 09/17/7090, 95:74 AM Certified in Neurology, Neurophysiology and Neuroimaging  [facility] [address] [place], [place] [address] [phone]

## 2021-05-19 NOTE — Patient Instructions (Signed)
°  NECK PAIN / RIGHT SHOULDER PAIN / RIGHT OCCIPITAL NEURALGIA (vs C2 radiculopathies) - follow up with pain mgmt and consider nerve blocks (Dr. Ernestina Patches) - consider PT /OT, massage therapy, acupuncture  INTERMITTENT CONFUSION / SHORT TERM MEMORY LOSS (since ~2019; h/o depression, OSA, HTN, DM) - safety / supervision issues reviewed - daily physical activity / exercise (at least 15-30 minutes) - eat more plants / vegetables - increase social activities, brain stimulation, games, puzzles, hobbies, crafts, arts, music - aim for at least 7-8 hours sleep per night (or more) - avoid smoking and alcohol - caregiver resources provided - caution with driving and finances

## 2021-05-26 ENCOUNTER — Telehealth: Payer: Self-pay | Admitting: Physical Medicine and Rehabilitation

## 2021-05-26 NOTE — Telephone Encounter (Signed)
Patient called needing to schedule an appointment with Dr Ernestina Patches for his neck. The number to contact patient is [phone]

## 2021-06-01 ENCOUNTER — Ambulatory Visit (INDEPENDENT_AMBULATORY_CARE_PROVIDER_SITE_OTHER): Payer: Federal, State, Local not specified - PPO | Admitting: Physical Medicine and Rehabilitation

## 2021-06-01 ENCOUNTER — Encounter: Payer: Self-pay | Admitting: Physical Medicine and Rehabilitation

## 2021-06-01 ENCOUNTER — Other Ambulatory Visit: Payer: Self-pay

## 2021-06-01 VITALS — BP 177/90 | HR 73

## 2021-06-01 DIAGNOSIS — M47812 Spondylosis without myelopathy or radiculopathy, cervical region: Secondary | ICD-10-CM

## 2021-06-01 DIAGNOSIS — M542 Cervicalgia: Secondary | ICD-10-CM

## 2021-06-01 DIAGNOSIS — M7918 Myalgia, other site: Secondary | ICD-10-CM

## 2021-06-01 DIAGNOSIS — G4733 Obstructive sleep apnea (adult) (pediatric): Secondary | ICD-10-CM | POA: Diagnosis not present

## 2021-06-01 NOTE — Progress Notes (Signed)
Pt here today for an MRI cervical review.

## 2021-06-01 NOTE — Progress Notes (Signed)
Ronald HUMBARGER - 71 y.o. male MRN [id number]  Date of birth: 1950-09-24  Office Visit Note: Visit Date: 06/01/2021 PCP: Wendie Agreste, MD Referred by: Wendie Agreste, MD  Subjective: Chief Complaint  Patient presents with   Neck - Pain   HPI: [name] is a 71 y.o. male who comes in today for evaluation of chronic right sided neck pain radiating up to scalp. Patient reports pain has been ongoing for several years, he states pain increased since injury approximately 2 years ago while lifting a deep freezer. Patient states pain is exacerbated by movement and activity, he describes pain as a tenderness and sore sensation, currently rates as 3 out of 10. Patient states his pain has improved over the last several weeks with conservative treatments at home. Patient reports some pain relief with home exercise regimen, massage therapy, and use of medications. He is currently taking Meloxicam and Tizanidine. Patients recent cervical MRI exhibits slight change when compared to 2013, degenerative spondylosis from C3-C4 through C6-C7, bilateral foraminal narrowing noted at C4-C5, C5-C6 and C6-C7 that could affect descending nerve roots at each level. No high grade spinal canal stenosis noted. Patient does not have history of formal physical therapy for his chronic neck issues, patient states he does not wish to attend these types of treatments at this time. Patient states he is a very active person and would like to get more information about possible treatments and is inquiring about cervical epidural steroid injections. Patient was recently seen by Dr. Melina Schools at [facility] whom did not believe patient was surgical candidate at this time. Patient was also recently seen by Dr. [name] at [facility] for evaluation of memory issues, neck pain and occipital neuralgia. Dr. Leta Baptist recommended treatment with possible nerve blocks, PT/OT, massage therapy and  acupuncture. Patient denies focal weakness, numbness and tingling. Patient denies recent trauma or falls.    Review of Systems  Musculoskeletal:  Positive for myalgias and neck pain.  Neurological:  Negative for tingling, sensory change, focal weakness and weakness.  All other systems reviewed and are negative. Otherwise per HPI.  Assessment & Plan: Visit Diagnoses:    ICD-10-CM   1. Cervicalgia  M54.2     2. Neck pain  M54.2     3. Cervical spondylosis without myelopathy  M47.812     4. Facet arthropathy, cervical  M47.812     5. Myofascial pain syndrome  M79.18        Plan: Findings:  Chronic right sided neck pain radiating up to scalp. Patients pain has improved over the last several weeks with conservative treatments at home, does not have severe discomfort at this time. Patients clinical presentation and exam are consistent with myofascial pain vs facet mediated pain. There are palpable trigger points noted to right levator scapulae muscle and mild pain/stiffness noted when turning head. We believe the next step is to continue with conservative therapies at home, patient encouraged to perform massage therapy and continue use of medications. If patients pain worsens or persists we would consider trigger point injections and possible facet joint blocks if warranted. We will have patient follow-up with [place] in approximately 4 weeks to re-assess. We do feel patient would benefit from formal physical therapy with manual treatments and dry needling, however he does not wish to do so at this time. No red flag symptoms noted upon exam today.    Meds & Orders: No orders of the defined types were placed  in this encounter.  No orders of the defined types were placed in this encounter.   Follow-up: Return for 4 week follow-up to re-assess, possible trigger point injections.   Procedures: No procedures performed      Clinical History: MRI OF THE RIGHT SHOULDER WITHOUT  CONTRAST  TECHNIQUE: Multiplanar, multisequence MR imaging of the shoulder was performed. No intravenous contrast was administered.  COMPARISON: None.  FINDINGS: Rotator cuff: There is a small focal transverse tear involving the footprint attachment region of the supraspinatus tendon anteriorly. This contacts the bursal surface. There is also a small oblique component. Otherwise, the rotator cuff tendons appear normal.  Muscles: Normal  Biceps long head: Intact  Acromioclavicular Joint: Mild AC joint degenerative changes. Type 2-3 acromion. Mild lateral downsloping and mild undersurface spurring.  Glenohumeral Joint: No degenerative changes or joint effusion or synovitis.  Labrum: No definite labral tears.  Bones: No acute bony findings.  Other: Moderate subacromial/subdeltoid bursitis.  IMPRESSION: 1. Small focal deep bursal surface tear involving the footprint attachment region of the supraspinatus tendon anteriorly. 2. Intact long head biceps tendon and glenoid labrum. 3. Moderate subacromial/subdeltoid bursitis. 4. Type 2-3 acromion with mild lateral downsloping and mild undersurface spurring.   Electronically Signed By: Marijo Sanes M.D. On: 11/02/2018 15:35 ----------  MRI CERVICAL SPINE WITHOUT CONTRAST   TECHNIQUE: Multiplanar, multisequence MR imaging of the cervical spine was performed. No intravenous contrast was administered.   COMPARISON:  Radiography 09/03/2020.  MRI 11/15/2011.   FINDINGS: Alignment: Mild straightening of the normal cervical lordosis.   Vertebrae: No fracture or focal bone lesion.   Cord: No primary cord lesion.  See below regarding stenosis.   Posterior Fossa, vertebral arteries, paraspinal tissues: Negative   Disc levels:   Foramen magnum is widely patent.  C1-2 is unremarkable.   C2-3: Mild facet osteoarthritis.  No canal or foraminal stenosis.   C3-4: Endplate osteophytes and bulging of the disc. Narrowing of  the subarachnoid space surrounding the cord. AP diameter of the canal in the midline 10.4 mm. No cord compression. Bilateral bony foraminal narrowing. Similar appearance to the study of 2013.   C4-5: Endplate osteophytes and bulging of the disc. Narrowing of the subarachnoid space surrounding the cord. AP diameter canal 1 cm. Bilateral foraminal stenosis could affect either C5 nerve. Slight worsening since 2013.   C5-6: Spondylosis with endplate osteophytes and bulging of the disc. Narrowing of the subarachnoid space surrounding the cord. AP diameter of the canal in the midline 9.2 mm. Bilateral foraminal stenosis could affect either C6 nerve. Similar appearance to the prior study.   C6-7: Endplate osteophytes and bulging of the disc. Narrowing of the ventral subarachnoid space but no compression of the cord. AP diameter of the canal in the midline 1.1 cm. Bilateral foraminal narrowing at could possibly affect either C7 nerve. Slight worsening since 2013.   C7-T1: Normal interspace.   IMPRESSION: Degenerative spondylosis from C3-4 through C6-7. Endplate osteophytes and bulging of the disc narrows the ventral subarachnoid space but do not compress the cord. Bilateral foraminal stenosis throughout that region could affect the nerve roots on either or both sides. Compared to the study of 2013, the findings are quite similar, with slight worsening notable at C4-5 and C6-7.     Electronically Signed   By: Nelson Chimes M.D.   On: 05/09/2021 20:33   He reports that he quit smoking about 38 years ago. His smoking use included cigarettes. He has a 5.00 pack-year smoking history. He has never  used smokeless tobacco.  Recent Labs    09/09/20 1342 12/05/20 1230 04/07/21 1428  HGBA1C 6.6* 7.0* 7.0*    Objective:  VS:  HT:     WT:    BMI:      BP: (!) 177/90   HR:73bpm   TEMP: ( )   RESP:  Physical Exam Vitals and nursing note reviewed.  HENT:     Head: Normocephalic and  atraumatic.     Right Ear: External ear normal.     Left Ear: External ear normal.     Nose: Nose normal.     Mouth/Throat:     Mouth: Mucous membranes are moist.  Eyes:     Extraocular Movements: Extraocular movements intact.  Cardiovascular:     Rate and Rhythm: Normal rate.     Pulses: Normal pulses.  Pulmonary:     Effort: Pulmonary effort is normal.  Abdominal:     General: Abdomen is flat. There is no distension.  Musculoskeletal:        General: Tenderness present.     Cervical back: Tenderness present.     Comments: Discomfort noted with flexion, extension and side-to-side rotation. Patient has good strength in the upper extremities including 5 out of 5 strength in wrist extension, long finger flexion and APB.  There is no atrophy of the hands intrinsically.  Sensation intact bilaterally. Palpable trigger points noted to right levator scapulae muscle. Negative Hoffman's sign. Negative Spurling's sign.    Skin:    General: Skin is warm and dry.     Capillary Refill: Capillary refill takes less than 2 seconds.  Neurological:     General: No focal deficit present.     Mental Status: He is alert and oriented to person, place, and time.  Psychiatric:        Mood and Affect: Mood normal.        Behavior: Behavior normal.    Ortho Exam  Imaging: No results found.  Past Medical/Family/Surgical/Social History: Medications & Allergies reviewed per EMR, new medications updated. Patient Active Problem List   Diagnosis Date Noted   Ceruminosis, bilateral 04/01/2020   Acute non-ST elevation myocardial infarction (NSTEMI) ([place]) 05/04/2019   Statin myopathy 04/04/2019   Statin intolerance -weakness, stiffness 02/13/2019   Rupture of right supraspinatus tendon 01/18/2019   Complex tear of medial meniscus of left knee 09/14/2018   Malignant neoplasm of prostate ([place]) 01/04/2018   Refusal of blood transfusions as patient is Jehovah's Witness    Pneumonia    Osteoarthritis     Obstructive sleep apnea    Hyperlipidemia    HTN (hypertension)    GERD (gastroesophageal reflux disease)    Depression    Coronary artery disease    Chronic lower back pain    Chest pain    Arthritis    Anemia    Benign essential HTN 01/03/2017   PAT (paroxysmal atrial tachycardia) ([place]) 10/27/2015   Type 2 diabetes mellitus with circulatory disorder, without long-term current use of insulin ([place]) 06/19/2015   Angina pectoris ([place])    Elevated PSA 12/20/2014   CAD (coronary artery disease)    Obstructive sleep apnea 09/06/2014   Personal history of colonic polyps 12/13/2013   Blood in stool 12/13/2013   Prostatitis, acute 12/13/2013   Hematochezia 12/13/2013   Insomnia 04/19/2013   Obesity with body mass index of 30.0-39.9 10/18/2012   Right lumbar radiculitis 09/29/2011   Adenomatous colon polyp 04/05/2009   ORGANIC IMPOTENCE 11/11/2008   Hyperlipidemia LDL  goal <70 08/23/2008   GERD 08/23/2008   BPH (benign prostatic hypertrophy) with urinary obstruction 08/23/2008   OSTEOARTHRITIS 08/23/2008   Past Medical History:  Diagnosis Date   Adenomatous colon polyp 2011   Anemia    Angina pectoris ([place]) 05/01/2015   med rx for 95% OM (u/a to access due to tortuosity), 95% D1 and other moderate CAD at cath   Arthritis    "back, knees" (05/01/2015)   Chest pain    pleuritic   Chronic lower back pain    Complex tear of medial meniscus of left knee 09/14/2018   Coronary artery disease    Depression    GERD (gastroesophageal reflux disease)    HTN (hypertension)    Hyperlipidemia    Obstructive sleep apnea    noncompliant with CPAP   Osteoarthritis    Pneumonia ~ 2014 X 1   Prostate cancer (H[facility])    Refusal of blood transfusions as patient is Jehovah's Witness    Rotator cuff arthropathy, right    Rupture of right supraspinatus tendon 01/18/2019   Type II or unspecified type diabetes mellitus without mention of complication, not stated as uncontrolled    Family History   Problem Relation Age of Onset   Coronary artery disease Other        CABG   Coronary artery disease Mother    Stroke Mother    Heart disease Mother    Diabetes Mother    Other Father    Diabetes Sister    Diabetes Maternal Uncle        x2   Alcohol abuse Other    Diabetes Paternal Uncle        x2   Colon cancer Paternal Uncle    Colon polyps Neg Hx    Esophageal cancer Neg Hx    Past Surgical History:  Procedure Laterality Date   CARDIAC CATHETERIZATION  2004   patent coronary arteries   CARDIAC CATHETERIZATION  05/01/2015   "tried to put stent in but couldn't"   CARDIAC CATHETERIZATION N/A 05/01/2015   Procedure: Left Heart Cath and Coronary Angiography;  Surgeon: Belva Crome, MD; LAD 60%, oD1 90%, pD1 70%, D2 70%, CFX patent stent, 30% distal to prev stent, pRCA 20%, OM1 90/95%, NL LV   CARDIAC CATHETERIZATION N/A 05/01/2015   Procedure: Coronary Stent Intervention;  Surgeon: Belva Crome, MD;  Unsuccessful PCI OM due to tortuosity   CARDIAC CATHETERIZATION N/A 11/06/2014   Procedure: Left Heart Cath and Coronary Angiography;  Surgeon: Belva Crome, MD;  Location: [facility];  Service: Cardiovascular;  Laterality: N/A;   CLOSED REDUCTION SHOULDER DISLOCATION Right ~ 1975   "& reattached muscle"   COLONOSCOPY W/ BIOPSIES AND POLYPECTOMY  X 2   ESOPHAGOGASTRODUODENOSCOPY ENDOSCOPY     KNEE ARTHROSCOPY WITH MEDIAL MENISECTOMY Left 09/14/2018   Procedure: LEFT KNEE ARTHROSCOPY CHONDROPLASY,  WITH MEDIAL MENISECTOMY;  Surgeon: Marchia Bond, MD;  Location: [facility];  Service: Orthopedics;  Laterality: Left;   PILONIDAL CYST EXCISION     SHOULDER ARTHROSCOPY WITH DEBRIDEMENT AND BICEP TENDON REPAIR Right 01/18/2019   Procedure: RIGHT SHOULDER ARTHROSCOPY WITH DEBRIDEMENT AND DECOMPRSSION SUBACROMIAL PARTIAL ACROMIOPLASTY;  Surgeon: Marchia Bond, MD;  Location: [facility];  Service: Orthopedics;  Laterality: Right;   SHOULDER ARTHROSCOPY  WITH ROTATOR CUFF REPAIR Right 01/18/2019   Procedure: SHOULDER ARTHROSCOPY WITH ROTATOR CUFF REPAIR;  Surgeon: Marchia Bond, MD;  Location: [facility];  Service: Orthopedics;  Laterality: Right;  Social History   Occupational History   Occupation: Retired    Fish farm manager: UNEMPLOYED  Tobacco Use   Smoking status: Former    Packs/day: 0.50    Years: 10.00    Pack years: 5.00    Types: Cigarettes    Quit date: 04/06/1983    Years since quitting: 38.1   Smokeless tobacco: Never  Vaping Use   Vaping Use: Never used  Substance and Sexual Activity   Alcohol use: Yes    Alcohol/week: 0.0 standard drinks    Comment: `/26/2017 "might drink a beer q couple months mostly; summertime I might drink 2-3 beers/week"   Drug use: No   Sexual activity: Not Currently

## 2021-06-03 DIAGNOSIS — T466X5A Adverse effect of antihyperlipidemic and antiarteriosclerotic drugs, initial encounter: Secondary | ICD-10-CM

## 2021-06-03 NOTE — Progress Notes (Signed)
[facility])  ?                                          [facility] Quality Pharmacy Team  ?                                      Statin Quality Measure Assessment ?  ? ?06/03/2021 ? ?[name] ?Aug 15, 1950 ?[id number] ? ?Per review of chart and payor information, this patient has been flagged for non-adherence to the following CMS Quality Measure:  ? [x]  Statin Use in Persons with Diabetes ? [x]  Statin Use in Persons with Cardiovascular Disease ? ?The ASCVD Risk score (Arnett DK, et al., 2019) failed to calculate for the following reasons: ?  The patient has a prior MI or stroke diagnosis ? ?Currently prescribed statin:  [x]  Yes []  No     ? H/o statin intolerance. Exclusion code G72.0, G26.9S8N associated to encounter on 10/22/2019. Patient was taking pravastatin 40 mg daily and last filled on 02/04/2021. He was recently seen by his endocrinologist and statin intolerance was noted. If deemed clinically appropriate, please consider associating exclusion code (see options below), assessing medication adherence with pravastatin, and possibly reduced statin frequency.  ? ?Next appointment with PCP: 06/04/2021 ? ?Please consider ONE of the following recommendations:  ? ?Initiate high intensity statin Atorvastatin 40mg  once daily, #90, 3 refills  ? Rosuvastatin 20mg  once daily, #90, 3 refills  ?  ?Initiate moderate intensity  ?        statin with reduced frequency if prior  ?        statin intolerance 1x weekly, #13, 3 refills  ? 2x weekly, #26, 3 refills  ? 3x weekly, #39, 3 refills  ? ?Code for past statin intolerance or other exclusions (required annually) ? Drug Induced Myopathy G72.0  ? Myositis, unspecified M60.9  ? Rhabdomyolysis M62.82  ? Cirrhosis of liver K74.69  ? Biliary cirrhosis, unspecified K74.5  ? Abnormal blood glucose - for SUPD ONLY R73.09  ? Prediabetes - for SUPD ONLY  R73.03  ? Polycystic ovarian syndrome E28.2  ? Adverse effect of antihyperlipidemic  and antiarteriosclerotic drugs, initial encounter T46.6X5A  ? ?Thank you for your time, ? ?Kristeen Miss, PharmD ?Clinical Pharmacist ?[place] ?Cell: [phone]   ?

## 2021-06-04 ENCOUNTER — Ambulatory Visit (INDEPENDENT_AMBULATORY_CARE_PROVIDER_SITE_OTHER): Payer: Federal, State, Local not specified - PPO | Admitting: Family Medicine

## 2021-06-04 ENCOUNTER — Encounter: Payer: Self-pay | Admitting: Family Medicine

## 2021-06-04 VITALS — BP 140/78 | HR 68 | Temp 98.0°F | Resp 17 | Ht 70.0 in | Wt 257.8 lb

## 2021-06-04 DIAGNOSIS — E785 Hyperlipidemia, unspecified: Secondary | ICD-10-CM

## 2021-06-04 DIAGNOSIS — E1159 Type 2 diabetes mellitus with other circulatory complications: Secondary | ICD-10-CM | POA: Diagnosis not present

## 2021-06-04 DIAGNOSIS — G4700 Insomnia, unspecified: Secondary | ICD-10-CM

## 2021-06-04 DIAGNOSIS — F32A Depression, unspecified: Secondary | ICD-10-CM | POA: Diagnosis not present

## 2021-06-04 DIAGNOSIS — I10 Essential (primary) hypertension: Secondary | ICD-10-CM

## 2021-06-04 DIAGNOSIS — G4733 Obstructive sleep apnea (adult) (pediatric): Secondary | ICD-10-CM

## 2021-06-04 LAB — COMPREHENSIVE METABOLIC PANEL
ALT: 17 U/L (ref 0–53)
AST: 20 U/L (ref 0–37)
Albumin: 4.1 g/dL (ref 3.5–5.2)
Alkaline Phosphatase: 87 U/L (ref 39–117)
BUN: 18 mg/dL (ref 6–23)
CO2: 29 mEq/L (ref 19–32)
Calcium: 9.1 mg/dL (ref 8.4–10.5)
Chloride: 104 mEq/L (ref 96–112)
Creatinine, Ser: 1.05 mg/dL (ref 0.40–1.50)
GFR: 71.53 mL/min (ref >60.00)
Glucose, Bld: 79 mg/dL (ref 70–99)
Potassium: 4 mEq/L (ref 3.5–5.1)
Sodium: 141 mEq/L (ref 135–145)
Total Bilirubin: 0.5 mg/dL (ref 0.2–1.2)
Total Protein: 6.9 g/dL (ref 6.0–8.3)

## 2021-06-04 LAB — LIPID PANEL
Cholesterol: 179 mg/dL (ref 0–200)
HDL: 41.3 mg/dL (ref >39.00)
LDL Cholesterol: 126 mg/dL — ABNORMAL HIGH (ref 0–99)
NonHDL: 138.04
Total CHOL/HDL Ratio: 4
Triglycerides: 59 mg/dL (ref 0.0–149.0)
VLDL: 11.8 mg/dL (ref 0.0–40.0)

## 2021-06-04 NOTE — Progress Notes (Signed)
Subjective:  Patient ID: [name], male    DOB: [birth year]  Age: 71 y.o. MRN: [id number]  CC:  Chief Complaint  Patient presents with   Hypertension    Pt here for recheck, has been getting refills from  [name]   Hyperlipidemia    Due for recheck    Diabetes    Pt due for recheck but does note he had recent check with Monna Fam no concerns from pt     HPI [name] presents for  Follow-up.  Last visit with me 06/23/2020  Depression: Elevated PHQ at his 01/28/2021 chronic care management visit.  Sertraline was increased from 25 to 50 mg daily. Mood is better on higher dose of sertraline, no new side effects.   Depression screen [facility] 2/9 06/04/2021 01/27/2021 12/05/2020 06/23/2020 04/30/2020  Decreased Interest 0 3 2 0 0  Down, Depressed, Hopeless 0 3 2 0 0  PHQ - 2 Score 0 6 4 0 0  Altered sleeping _0 - 0  Tired, decreased energy _1 - 1  Change in appetite 3 0 0 - 0  Feeling bad or failure about yourself  0 1 0 - 0  Trouble concentrating _2 - 2  Moving slowly or fidgety/restless 0 1 2 - 0  Suicidal thoughts 0 1 0 - 0  PHQ-9 Score _3 - 3  Difficult doing work/chores - Somewhat difficult Somewhat difficult - -  Some recent data might be hidden    Hypertension: With OSA, uses CPAP nightly.   Also with history of CAD status post DES in 2016, paroxysmal atrial fibrillation, not on anticoagulation due to low burden.  Followed by cardiology, Dr. Tamala Julian, and last appointment 1 year ago.  Recommended adding spironolactone 12.5 mg daily at that time. Next appt 08/18/21.  Unable to tolerate amlodipine previously due to foot swelling. Chronic care management note from 01/27/2021, met with pharmacist. Taking hydralazine 50 mg 3 times daily, losartan 100 mg daily, HCTZ 25 mg daily.  Had headache with isosorbide, swelling with amlodipine.   Headache with spirinolactone?  Plans to try again - has at home BP 150/80. Sometimes better. 128/84 at endocrinology.eating some  salty foods.   Taking tizanidine for neck, gets to sleep - waking up during night and trouble getting back to sleep. No PND/CP//dyspnea at night.  Daytime naps.   BP Readings from Last 3 Encounters:  06/04/21 140/78  06/01/21 (!) 177/90  05/19/21 (!) 193/94   Lab Results  Component Value Date   CREATININE 1.04 12/05/2020   Diabetes with circulatory complication Followed by endocrinology, appointment noted from January 3 with Dr. Cruzita Lederer.  A1c 7.0.  Continued on metformin, glipizide, Ozempic.  61-monthfollow-up planned.  Off insulin. Unable to tolerate statins or Repatha due to muscle weakness.  However was taking pravastatin intermittently every 2 to 3 days at last visit with endocrinology. Taking pravastatin 2-3 days per week.  Last ate this am - over 6 hours.   Lab Results  Component Value Date   HGBA1C 7.0 (A) 04/07/2021   Lab Results  Component Value Date   CHOL 197 12/05/2020   HDL 38.80 (L) 12/05/2020   LDLCALC 139 (H) 12/05/2020   LDLDIRECT 131 (H) 03/22/2019   TRIG 93.0 12/05/2020   CHOLHDL 5 12/05/2020    Pro[facility].  Diagnosed 2019.  IMRT, 2019, ongoing follow-up with urology.  Last visit January 10.   History Patient Active  Problem List   Diagnosis Date Noted   Ceruminosis, bilateral 04/01/2020   Acute non-ST elevation myocardial infarction (NSTEMI) ([place]) 05/04/2019   Statin myopathy 04/04/2019   Statin intolerance -weakness, stiffness 02/13/2019   Rupture of right supraspinatus tendon 01/18/2019   Complex tear of medial meniscus of left knee 09/14/2018   Malignant neoplasm of prostate ([place]) 01/04/2018   Refusal of blood transfusions as patient is Jehovah's Witness    Pneumonia    Osteoarthritis    Obstructive sleep apnea    Hyperlipidemia    HTN (hypertension)    GERD (gastroesophageal reflux disease)    Depression    Coronary artery disease    Chronic lower back pain    Chest pain    Arthritis    Anemia     Benign essential HTN 01/03/2017   PAT (paroxysmal atrial tachycardia) ([place]) 10/27/2015   Type 2 diabetes mellitus with circulatory disorder, without long-term current use of insulin ([place]) 06/19/2015   Angina pectoris (HCC)    Elevated PSA 12/20/2014   CAD (coronary artery disease)    Obstructive sleep apnea 09/06/2014   Personal history of colonic polyps 12/13/2013   Blood in stool 12/13/2013   Prostatitis, acute 12/13/2013   Hematochezia 12/13/2013   Insomnia 04/19/2013   Obesity with body mass index of 30.0-39.9 10/18/2012   Right lumbar radiculitis 09/29/2011   Adenomatous colon polyp 04/05/2009   ORGANIC IMPOTENCE 11/11/2008   Hyperlipidemia LDL goal <70 08/23/2008   GERD 08/23/2008   BPH (benign prostatic hypertrophy) with urinary obstruction 08/23/2008   OSTEOARTHRITIS 08/23/2008   Past Medical History:  Diagnosis Date   Adenomatous colon polyp 2011   Anemia    Angina pectoris ([place]) 05/01/2015   med rx for 95% OM (u/a to access due to tortuosity), 95% D1 and other moderate CAD at cath   Arthritis    "back, knees" (05/01/2015)   Chest pain    pleuritic   Chronic lower back pain    Complex tear of medial meniscus of left knee 09/14/2018   Coronary artery disease    Depression    GERD (gastroesophageal reflux disease)    HTN (hypertension)    Hyperlipidemia    Obstructive sleep apnea    noncompliant with CPAP   Osteoarthritis    Pneumonia ~ 2014 X 1   Prostate cancer ([place])    Refusal of blood transfusions as patient is Jehovah's Witness    Rotator cuff arthropathy, right    Rupture of right supraspinatus tendon 01/18/2019   Type II or unspecified type diabetes mellitus without mention of complication, not stated as uncontrolled    Past Surgical History:  Procedure Laterality Date   CARDIAC CATHETERIZATION  2004   patent coronary arteries   CARDIAC CATHETERIZATION  05/01/2015   "tried to put stent in but couldn't"   CARDIAC CATHETERIZATION N/A 05/01/2015    Procedure: Left Heart Cath and Coronary Angiography;  Surgeon: Belva Crome, MD; LAD 60%, oD1 90%, pD1 70%, D2 70%, CFX patent stent, 30% distal to prev stent, pRCA 20%, OM1 90/95%, NL LV   CARDIAC CATHETERIZATION N/A 05/01/2015   Procedure: Coronary Stent Intervention;  Surgeon: Belva Crome, MD;  Unsuccessful PCI OM due to tortuosity   CARDIAC CATHETERIZATION N/A 11/06/2014   Procedure: Left Heart Cath and Coronary Angiography;  Surgeon: Belva Crome, MD;  Location: [place] [facility];  Service: Cardiovascular;  Laterality: N/A;   CLOSED REDUCTION SHOULDER DISLOCATION Right ~ 1975   "& reattached muscle"  COLONOSCOPY W/ BIOPSIES AND POLYPECTOMY  X 2   ESOPHAGOGASTRODUODENOSCOPY ENDOSCOPY     KNEE ARTHROSCOPY WITH MEDIAL MENISECTOMY Left 09/14/2018   Procedure: LEFT KNEE ARTHROSCOPY CHONDROPLASY,  WITH MEDIAL MENISECTOMY;  Surgeon: Marchia Bond, MD;  Location: [place];  Service: Orthopedics;  Laterality: Left;   PILONIDAL CYST EXCISION     SHOULDER ARTHROSCOPY WITH DEBRIDEMENT AND BICEP TENDON REPAIR Right 01/18/2019   Procedure: RIGHT SHOULDER ARTHROSCOPY WITH DEBRIDEMENT AND DECOMPRSSION SUBACROMIAL PARTIAL ACROMIOPLASTY;  Surgeon: Marchia Bond, MD;  Location: [place];  Service: Orthopedics;  Laterality: Right;   SHOULDER ARTHROSCOPY WITH ROTATOR CUFF REPAIR Right 01/18/2019   Procedure: SHOULDER ARTHROSCOPY WITH ROTATOR CUFF REPAIR;  Surgeon: Marchia Bond, MD;  Location: [place];  Service: Orthopedics;  Laterality: Right;   Allergies  Allergen Reactions   Statins Other (See Comments)    REACTION: joint pain Lipitor- headaches Has also tried Livalo, pravachol, zetia, crestor, welchol   Imdur [Isosorbide Dinitrate]     Headache - "violent"   Prior to Admission medications   Medication Sig Start Date End Date Taking? Authorizing Provider  albuterol (VENTOLIN HFA) 108 (90 Base) MCG/ACT inhaler INHALE 1-2 PUFFS INTO THE LUNGS  EVERY 4 HOURS AS NEEDED FOR WHEEZING OR SHORTNESS OF BREATH. 02/16/21   Wendie Agreste, MD  aspirin EC 81 MG tablet Take 81 mg by mouth daily. Swallow whole.    [provider]  Blood Glucose Monitoring Suppl (RELION PREMIER BLU MONITOR) DEVI 1 Device by Does not apply route daily. 07/12/16   [name], MD  COVID-19 mRNA bivalent vaccine, Moderna, (MODERNA COVID-19 BIVAL BOOSTER) 50 MCG/0.5ML injection Inject into the muscle. 01/06/21     fluticasone (FLONASE) 50 MCG/ACT nasal spray Place 1-2 sprays into both nostrils daily. 04/30/20   Wendie Agreste, MD  glipiZIDE (GLUCOTROL XL) 5 MG 24 hr tablet Take 1 tablet (5 mg total) by mouth 2 (two) times daily. 09/09/20   [name], MD  glucose blood test strip Use as instructed to check sugar 2 times daily 07/12/16   [name], MD  hydrALAZINE (APRESOLINE) 50 MG tablet Take 1 tablet (50 mg total) by mouth 3 (three) times daily. 06/06/20   Belva Crome, MD  hydrochlorothiazide (HYDRODIURIL) 25 MG tablet TAKE 1 TABLET BY MOUTH EVERY DAY 07/29/20   Belva Crome, MD  losartan (COZAAR) 100 MG tablet TAKE 1 TABLET BY MOUTH EVERY DAY 07/29/20   Belva Crome, MD  metFORMIN (GLUCOPHAGE) 500 MG tablet Take 2 tablets (1,000 mg total) by mouth 2 (two) times daily with a meal. 09/09/20   [name], MD  metoprolol tartrate (LOPRESSOR) 50 MG tablet TAKE 1 TABLET BY MOUTH TWICE A DAY 10/09/20   Belva Crome, MD  nitroGLYCERIN (NITROSTAT) 0.4 MG SL tablet PLACE 1 TABLET UNDER THE TONGUE EVERY 5 MIN X 3 DOSES AS NEEDED FOR CHEST PAIN 08/02/19   Belva Crome, MD  pravastatin (PRAVACHOL) 40 MG tablet Take 80 mg by mouth daily.    [provider]  Semaglutide,0.25 or 0.5MG/DOS, (OZEMPIC, 0.25 OR 0.5 MG/DOSE,) 2 MG/1.5ML SOPN Inject 0.5 mg into the skin once a week. 09/09/20   [name], MD  sertraline (ZOLOFT) 25 MG tablet TAKE 1 TABLET (25 MG TOTAL) BY MOUTH DAILY. 05/07/21   Wendie Agreste, MD  sertraline (ZOLOFT) 50 MG  tablet Take 1 tablet (50 mg total) by mouth daily. 01/28/21   Midge Minium, MD  sildenafil (REVATIO) 20 MG  tablet Take 3-5 tablets 1hr prior to intercourse 04/14/21   [name], MD   Social History   Socioeconomic History   Marital status: Married    Spouse name: [name]   Number of children: 4   Years of education: Not on file   Highest education level: High school graduate  Occupational History   Occupation: Retired    Fish farm manager: UNEMPLOYED  Tobacco Use   Smoking status: Former    Packs/day: 0.50    Years: 10.00    Pack years: 5.00    Types: Cigarettes    Quit date: 04/06/1983    Years since quitting: 38.1   Smokeless tobacco: Never  Vaping Use   Vaping Use: Never used  Substance and Sexual Activity   Alcohol use: Yes    Alcohol/week: 0.0 standard drinks    Comment: `/26/2017 "might drink a beer q couple months mostly; summertime I might drink 2-3 beers/week"   Drug use: No   Sexual activity: Not Currently  Other Topics Concern   Not on file  Social History Narrative   Lives in [place] with spouse [name]).  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   Social Determinants of Health   Financial Resource Strain: Not on file  Food Insecurity: Not on file  Transportation Needs: Not on file  Physical Activity: Not on file  Stress: Not on file  Social Connections: Not on file  Intimate Partner Violence: Not on file    Review of Systems  Constitutional:  Negative for fatigue and unexpected weight change.  Eyes:  Negative for visual disturbance.  Respiratory:  Negative for cough, chest tightness and shortness of breath.   Cardiovascular:  Negative for chest pain, palpitations and leg swelling.  Gastrointestinal:  Negative for abdominal pain and blood in stool.  Neurological:  Negative for dizziness, light-headedness and headaches.    Objective:   Vitals:   06/04/21 1341  BP: 140/78  Pulse: 68   Resp: 17  Temp: 98 F (36.7 C)  TempSrc: Temporal  SpO2: 95%  Weight: 257 lb 12.8 oz (116.9 kg)  Height: _0  (1.778 m)    Physical Exam Vitals reviewed.  Constitutional:      Appearance: He is well-developed.  HENT:     Head: Normocephalic and atraumatic.  Neck:     Vascular: No carotid bruit or JVD.  Cardiovascular:     Rate and Rhythm: Normal rate and regular rhythm.     Heart sounds: Normal heart sounds. No murmur heard. Pulmonary:     Effort: Pulmonary effort is normal.     Breath sounds: Normal breath sounds. No rales.  Musculoskeletal:     Right lower leg: No edema.     Left lower leg: No edema.  Skin:    General: Skin is warm and dry.  Neurological:     Mental Status: He is alert and oriented to person, place, and time.  Psychiatric:        Mood and Affect: Mood normal.        Behavior: Behavior normal.       Assessment & Plan:  IOAN LANDINI is a 71 y.o. male . Type 2 diabetes mellitus with other circulatory complication, without long-term current use of insulin (HCC) - Plan: Comprehensive metabolic panel  -Continue follow-up with endocrinology.  Recent A1c noted as above.  Hyperlipidemia LDL goal <70 - Plan: Comprehensive metabolic panel Primary hypertension - Plan: Lipid panel  -  Borderline hypertension, may need to restart spironolactone if elevated readings at home.  Avoidance of sodium discussed and handout given on common causes.  -Continue statin, goal of daily dosing.  Depression, unspecified depression type  -Improved on higher dose sertraline, continue same.  Insomnia, unspecified type OSA (obstructive sleep apnea)  -Nighttime wakening, difficulty getting back to sleep.  Potentially may be due to daytime napping.  Sleep hygiene discussed, handout given, RTC precautions if persistent.  Continue CPAP.  No orders of the defined types were placed in this encounter.  Patient Instructions  I do recommend starting spirinolactone that was  prescribed by Dr. Tamala Julian if blood pressure over 140/90 once you cut back on salty foods. See handout on some of the foods that can have more salt. Keep follow up with cardiologist.   Try to cut back on daytime naps and see if that helps remain asleep at night.  If that is not helping please schedule visit and we can discuss sleep issues and other treatments further.  See information below on difficulty with sleep.   Return to the clinic or go to the nearest emergency room if any of your symptoms worsen or new symptoms occur.  Insomnia Insomnia is a sleep disorder that makes it difficult to fall asleep or stay asleep. Insomnia can cause fatigue, low energy, difficulty concentrating, mood swings, and poor performance at work or school. There are three different ways to classify insomnia: Difficulty falling asleep. Difficulty staying asleep. Waking up too early in the morning. Any type of insomnia can be long-term (chronic) or short-term (acute). Both are common. Short-term insomnia usually lasts for three months or less. Chronic insomnia occurs at least three times a week for longer than three months. What are the causes? Insomnia may be caused by another condition, situation, or substance, such as: Anxiety. Certain medicines. Gastroesophageal reflux disease (GERD) or other gastrointestinal conditions. Asthma or other breathing conditions. Restless legs syndrome, sleep apnea, or other sleep disorders. Chronic pain. Menopause. Stroke. Abuse of alcohol, tobacco, or illegal drugs. Mental health conditions, such as depression. Caffeine. Neurological disorders, such as Alzheimer's disease. An overactive thyroid (hyperthyroidism). Sometimes, the cause of insomnia may not be known. What increases the risk? Risk factors for insomnia include: Gender. Women are affected more often than men. Age. Insomnia is more common as you get older. Stress. Lack of exercise. Irregular work schedule or  working night shifts. Traveling between different time zones. Certain medical and mental health conditions. What are the signs or symptoms? If you have insomnia, the main symptom is having trouble falling asleep or having trouble staying asleep. This may lead to other symptoms, such as: Feeling fatigued or having low energy. Feeling nervous about going to sleep. Not feeling rested in the morning. Having trouble concentrating. Feeling irritable, anxious, or depressed. How is this diagnosed? This condition may be diagnosed based on: Your symptoms and medical history. Your health care provider may ask about: Your sleep habits. Any medical conditions you have. Your mental health. A physical exam. How is this treated? Treatment for insomnia depends on the cause. Treatment may focus on treating an underlying condition that is causing insomnia. Treatment may also include: Medicines to help you sleep. Counseling or therapy. Lifestyle adjustments to help you sleep better. Follow these instructions at home: Eating and drinking  Limit or avoid alcohol, caffeinated beverages, and cigarettes, especially close to bedtime. These can disrupt your sleep. Do not eat a large meal or eat spicy foods right before bedtime. This  can lead to digestive discomfort that can make it hard for you to sleep. Sleep habits  Keep a sleep diary to help you and your health care provider figure out what could be causing your insomnia. Write down: When you sleep. When you wake up during the night. How well you sleep. How rested you feel the next day. Any side effects of medicines you are taking. What you eat and drink. Make your bedroom a dark, comfortable place where it is easy to fall asleep. Put up shades or blackout curtains to block light from outside. Use a white noise machine to block noise. Keep the temperature cool. Limit screen use before bedtime. This includes: Watching TV. Using your smartphone,  tablet, or computer. Stick to a routine that includes going to bed and waking up at the same times every day and night. This can help you fall asleep faster. Consider making a quiet activity, such as reading, part of your nighttime routine. Try to avoid taking naps during the day so that you sleep better at night. Get out of bed if you are still awake after 15 minutes of trying to sleep. Keep the lights down, but try reading or doing a quiet activity. When you feel sleepy, go back to bed. General instructions Take over-the-counter and prescription medicines only as told by your health care provider. Exercise regularly, as told by your health care provider. Avoid exercise starting several hours before bedtime. Use relaxation techniques to manage stress. Ask your health care provider to suggest some techniques that may work well for you. These may include: Breathing exercises. Routines to release muscle tension. Visualizing peaceful scenes. Make sure that you drive carefully. Avoid driving if you feel very sleepy. Keep all follow-up visits as told by your health care provider. This is important. Contact a health care provider if: You are tired throughout the day. You have trouble in your daily routine due to sleepiness. You continue to have sleep problems, or your sleep problems get worse. Get help right away if: You have serious thoughts about hurting yourself or someone else. If you ever feel like you may hurt yourself or others, or have thoughts about taking your own life, get help right away. You can go to your nearest emergency department or call: Your local emergency services (911 in the [place] A suicide crisis helpline, such as the [facility] at 856-152-2308 or 988 in the [place]. This is open 24 hours a day. Summary Insomnia is a sleep disorder that makes it difficult to fall asleep or stay asleep. Insomnia can be long-term (chronic) or short-term  (acute). Treatment for insomnia depends on the cause. Treatment may focus on treating an underlying condition that is causing insomnia. Keep a sleep diary to help you and your health care provider figure out what could be causing your insomnia. This information is not intended to replace advice given to you by your health care provider. Make sure you discuss any questions you have with your health care provider. Document Revised: 10/15/2020 Document Reviewed: 01/31/2020 Elsevier Patient Education  2022 [place],   [name], MD [place], [facility] 06/04/21 2:20 PM

## 2021-06-04 NOTE — Patient Instructions (Addendum)
I do recommend starting spirinolactone that was prescribed by Dr. Tamala Julian if blood pressure over 140/90 once you cut back on salty foods. See handout on some of the foods that can have more salt. Keep follow up with cardiologist.  ? ?Try to cut back on daytime naps and see if that helps remain asleep at night.  If that is not helping please schedule visit and we can discuss sleep issues and other treatments further.  See information below on difficulty with sleep.  ? ?Return to the clinic or go to the nearest emergency room if any of your symptoms worsen or new symptoms occur. ? ?Insomnia ?Insomnia is a sleep disorder that makes it difficult to fall asleep or stay asleep. Insomnia can cause fatigue, low energy, difficulty concentrating, mood swings, and poor performance at work or school. ?There are three different ways to classify insomnia: ?Difficulty falling asleep. ?Difficulty staying asleep. ?Waking up too early in the morning. ?Any type of insomnia can be long-term (chronic) or short-term (acute). Both are common. Short-term insomnia usually lasts for three months or less. Chronic insomnia occurs at least three times a week for longer than three months. ?What are the causes? ?Insomnia may be caused by another condition, situation, or substance, such as: ?Anxiety. ?Certain medicines. ?Gastroesophageal reflux disease (GERD) or other gastrointestinal conditions. ?Asthma or other breathing conditions. ?Restless legs syndrome, sleep apnea, or other sleep disorders. ?Chronic pain. ?Menopause. ?Stroke. ?Abuse of alcohol, tobacco, or illegal drugs. ?Mental health conditions, such as depression. ?Caffeine. ?Neurological disorders, such as Alzheimer's disease. ?An overactive thyroid (hyperthyroidism). ?Sometimes, the cause of insomnia may not be known. ?What increases the risk? ?Risk factors for insomnia include: ?Gender. Women are affected more often than men. ?Age. Insomnia is more common as you get  older. ?Stress. ?Lack of exercise. ?Irregular work schedule or working night shifts. ?Traveling between different time zones. ?Certain medical and mental health conditions. ?What are the signs or symptoms? ?If you have insomnia, the main symptom is having trouble falling asleep or having trouble staying asleep. This may lead to other symptoms, such as: ?Feeling fatigued or having low energy. ?Feeling nervous about going to sleep. ?Not feeling rested in the morning. ?Having trouble concentrating. ?Feeling irritable, anxious, or depressed. ?How is this diagnosed? ?This condition may be diagnosed based on: ?Your symptoms and medical history. Your health care provider may ask about: ?Your sleep habits. ?Any medical conditions you have. ?Your mental health. ?A physical exam. ?How is this treated? ?Treatment for insomnia depends on the cause. Treatment may focus on treating an underlying condition that is causing insomnia. Treatment may also include: ?Medicines to help you sleep. ?Counseling or therapy. ?Lifestyle adjustments to help you sleep better. ?Follow these instructions at home: ?Eating and drinking ? ?Limit or avoid alcohol, caffeinated beverages, and cigarettes, especially close to bedtime. These can disrupt your sleep. ?Do not eat a large meal or eat spicy foods right before bedtime. This can lead to digestive discomfort that can make it hard for you to sleep. ?Sleep habits ? ?Keep a sleep diary to help you and your health care provider figure out what could be causing your insomnia. Write down: ?When you sleep. ?When you wake up during the night. ?How well you sleep. ?How rested you feel the next day. ?Any side effects of medicines you are taking. ?What you eat and drink. ?Make your bedroom a dark, comfortable place where it is easy to fall asleep. ?Put up shades or blackout curtains to block light from  outside. ?Use a white noise machine to block noise. ?Keep the temperature cool. ?Limit screen use before  bedtime. This includes: ?Watching TV. ?Using your smartphone, tablet, or computer. ?Stick to a routine that includes going to bed and waking up at the same times every day and night. This can help you fall asleep faster. Consider making a quiet activity, such as reading, part of your nighttime routine. ?Try to avoid taking naps during the day so that you sleep better at night. ?Get out of bed if you are still awake after 15 minutes of trying to sleep. Keep the lights down, but try reading or doing a quiet activity. When you feel sleepy, go back to bed. ?General instructions ?Take over-the-counter and prescription medicines only as told by your health care provider. ?Exercise regularly, as told by your health care provider. Avoid exercise starting several hours before bedtime. ?Use relaxation techniques to manage stress. Ask your health care provider to suggest some techniques that may work well for you. These may include: ?Breathing exercises. ?Routines to release muscle tension. ?Visualizing peaceful scenes. ?Make sure that you drive carefully. Avoid driving if you feel very sleepy. ?Keep all follow-up visits as told by your health care provider. This is important. ?Contact a health care provider if: ?You are tired throughout the day. ?You have trouble in your daily routine due to sleepiness. ?You continue to have sleep problems, or your sleep problems get worse. ?Get help right away if: ?You have serious thoughts about hurting yourself or someone else. ?If you ever feel like you may hurt yourself or others, or have thoughts about taking your own life, get help right away. You can go to your nearest emergency department or call: ?Your local emergency services (911 in the [place] ?A suicide crisis helpline, such as the [facility] at 856 670 1164 or 988 in the [place]. This is open 24 hours a day. ?Summary ?Insomnia is a sleep disorder that makes it difficult to fall asleep or stay  asleep. ?Insomnia can be long-term (chronic) or short-term (acute). ?Treatment for insomnia depends on the cause. Treatment may focus on treating an underlying condition that is causing insomnia. ?Keep a sleep diary to help you and your health care provider figure out what could be causing your insomnia. ?This information is not intended to replace advice given to you by your health care provider. Make sure you discuss any questions you have with your health care provider. ?Document Revised: 10/15/2020 Document Reviewed: 01/31/2020 ?Elsevier Patient Education ? 2022 [place]. ? ? ? ? ? ? ? ?

## 2021-06-26 ENCOUNTER — Telehealth: Payer: Self-pay | Admitting: Physical Medicine and Rehabilitation

## 2021-06-26 NOTE — Telephone Encounter (Signed)
Patient called needing to reschedule his appointment     [phone] ?

## 2021-06-29 ENCOUNTER — Ambulatory Visit: Payer: Federal, State, Local not specified - PPO | Admitting: Physical Medicine and Rehabilitation

## 2021-07-06 ENCOUNTER — Ambulatory Visit: Payer: Medicare Other | Admitting: Diagnostic Neuroimaging

## 2021-08-06 ENCOUNTER — Encounter: Payer: Self-pay | Admitting: Internal Medicine

## 2021-08-06 ENCOUNTER — Ambulatory Visit (INDEPENDENT_AMBULATORY_CARE_PROVIDER_SITE_OTHER): Payer: Federal, State, Local not specified - PPO | Admitting: Internal Medicine

## 2021-08-06 VITALS — BP 150/90 | HR 74 | Ht 70.0 in | Wt 255.4 lb

## 2021-08-06 DIAGNOSIS — E669 Obesity, unspecified: Secondary | ICD-10-CM | POA: Diagnosis not present

## 2021-08-06 DIAGNOSIS — E785 Hyperlipidemia, unspecified: Secondary | ICD-10-CM | POA: Diagnosis not present

## 2021-08-06 DIAGNOSIS — E1159 Type 2 diabetes mellitus with other circulatory complications: Secondary | ICD-10-CM

## 2021-08-06 LAB — POCT GLYCOSYLATED HEMOGLOBIN (HGB A1C): Hemoglobin A1C: 7 % — AB (ref 4.0–5.6)

## 2021-08-06 NOTE — Progress Notes (Signed)
Patient ID: [name], male   DOB: 09-01-1950, 71 y.o.   MRN: [id number] ? ?This visit occurred during the SARS-CoV-2 public health emergency.  Safety protocols were in place, including screening questions prior to the visit, additional usage of staff PPE, and extensive cleaning of exam room while observing appropriate contact time as indicated for disinfecting solutions.  ? ?HPI: ?[name] is a 71 y.o.-year-old male, returning for f/u for DM2, dx 2007, insulin-dependent since 2014, uncontrolled, with complications (CAD - s/p NSTEMI 11/2014 - stent; ED; mild CKD). Last visit 4 months ago. ?PCP: Dr Carlota Raspberry ? ?Interim history: ?No increased urination, blurry vision, nausea, chest pain. ?He had a vein busting on the medial side of his R leg >> healed now. ? ?Reviewed HbA1c levels: ?Lab Results  ?Component Value Date  ? HGBA1C 7.0 (A) 04/07/2021  ? HGBA1C 7.0 (H) 12/05/2020  ? HGBA1C 6.6 (A) 09/09/2020  ? ?Pt is on a regimen of: ?- Metformin 1000 mg 2x a day ?- Glipizide XL 5 mg 2x a day -crushes the evening dose ?- Ozempic 0.5 mg weekly >> GERD >> restarted 08/2019 at 0.25 mg weekly >> 0.5 mg  ?He came off Levemir 20 units at bedtime before our visit in 09/2020 after he ran out and could not refill it ?He tried Toujeo 20 units at night but did not like it ?He did not start Januvia as suggested at last visit ("I don't think I need it"). ?He was Levemir 140 units in am >> decreased to 70 units>> stopped as this was too expensive for him ?He stopped Welchol 3.7g at bedtime a day. ?Stopped Farxiga 10 mg b/c frequent urination and urgency. ?He was on Glipizide in the past. ?He was on Metformin in the past >> no N/V.  ? ?Pt checks his sugars once a day: ?- am:  98-100 >> 120-130 >> 90-120 >> 98-130 >> 79, 89-112, 152 ?- 2h after b'fast: n/c >> 128, 134 >> n/c ?- before lunch: n/c >> 140s >> n/c >> 100-120 >> n/c ?- 2h after lunch: n/c >> 140-157 >> n/c >> 130s >> n/c ?- before dinner:  106-120 >> n/c >> 80-130 >>  n/c ?- 2h after dinner: 150-180, 200 >> n/c >> up to 160s >> n/c ?- bedtime:   180-200 >> 230-240 >> 137-140 >> 150 >> n/c ?- nighttime:94, 190-305 >> 148-216 >> n/c  ?Lowest sugar was 23!!! when took Ozempic every day by mistake >> 101 >> 90 >> 98 >> 79; he has hypoglycemia awareness in the 80s ?Highest sugar was 200 x1 >> 170 >> 175 >> 152. ? ?-+ Mild CKD, last BUN/creatinine:  ?Lab Results  ?Component Value Date  ? BUN 18 06/04/2021  ? CREATININE 1.05 06/04/2021  ?09/07/2016: 13/0.9, glucose 133 ?On losartan. ? ?-+ HL; last set of lipids: ?Lab Results  ?Component Value Date  ? CHOL 179 06/04/2021  ? HDL 41.30 06/04/2021  ? LDLCALC 126 (H) 06/04/2021  ? LDLDIRECT 131 (H) 03/22/2019  ? TRIG 59.0 06/04/2021  ? CHOLHDL 4 06/04/2021  ?09/07/2016: 167/65/33/121 ?He is on pravastatin inconsistently - every 2-3 days - now off for last week due to joint pains.  Previous muscle weakness with statins. He stopped Repatha due to muscle weakness. ? ?- last eye exam was in 07/2021: No DR reportedly; Dr. Lady Gary.  ? ?- He denies  numbness and tingling in his feet. ? ?Pt was admitted for CP in 11/2014: NSTEMI >> CAD >> PTCA >> stent placed.  ?  He was in ED with CP 05/2017 >> no cardiac pathology. ?He had shoulder surgery 01/18/2019 after rupture of his supraspinatus tendon. ?In 2020, he had radiotherapy for his prostate cancer (with gold beads).   ? ?ROS: ?+ see HPI ?+ acid reflux ? ?I reviewed pt's medications, allergies, PMH, social hx, family hx, and changes were documented in the history of present illness. Otherwise, unchanged from my initial visit note. ? ?Past Medical History:  ?Diagnosis Date  ? Adenomatous colon polyp 2011  ? Anemia   ? Angina pectoris ([place]) 05/01/2015  ? med rx for 95% OM (u/a to access due to tortuosity), 95% D1 and other moderate CAD at cath  ? Arthritis   ? "back, knees" (05/01/2015)  ? Chest pain   ? pleuritic  ? Chronic lower back pain   ? Complex tear of medial meniscus of left knee 09/14/2018  ?  Coronary artery disease   ? Depression   ? GERD (gastroesophageal reflux disease)   ? HTN (hypertension)   ? Hyperlipidemia   ? Obstructive sleep apnea   ? noncompliant with CPAP  ? Osteoarthritis   ? Pneumonia ~ 2014 X 1  ? Prostate cancer ([place])   ? Refusal of blood transfusions as patient is Jehovah's Witness   ? Rotator cuff arthropathy, right   ? Rupture of right supraspinatus tendon 01/18/2019  ? Type II or unspecified type diabetes mellitus without mention of complication, not stated as uncontrolled   ? ?Past Surgical History:  ?Procedure Laterality Date  ? CARDIAC CATHETERIZATION  2004  ? patent coronary arteries  ? CARDIAC CATHETERIZATION  05/01/2015  ? "tried to put stent in but couldn't"  ? CARDIAC CATHETERIZATION N/A 05/01/2015  ? Procedure: Left Heart Cath and Coronary Angiography;  Surgeon: Belva Crome, MD; LAD 60%, oD1 90%, pD1 70%, D2 70%, CFX patent stent, 30% distal to prev stent, pRCA 20%, OM1 90/95%, NL LV  ? CARDIAC CATHETERIZATION N/A 05/01/2015  ? Procedure: Coronary Stent Intervention;  Surgeon: Belva Crome, MD;  Unsuccessful PCI OM due to tortuosity  ? CARDIAC CATHETERIZATION N/A 11/06/2014  ? Procedure: Left Heart Cath and Coronary Angiography;  Surgeon: Belva Crome, MD;  Location: [facility];  Service: Cardiovascular;  Laterality: N/A;  ? CLOSED REDUCTION SHOULDER DISLOCATION Right ~ 1975  ? "& reattached muscle"  ? COLONOSCOPY W/ BIOPSIES AND POLYPECTOMY  X 2  ? ESOPHAGOGASTRODUODENOSCOPY ENDOSCOPY    ? KNEE ARTHROSCOPY WITH MEDIAL MENISECTOMY Left 09/14/2018  ? Procedure: LEFT KNEE ARTHROSCOPY CHONDROPLASY,  WITH MEDIAL MENISECTOMY;  Surgeon: Marchia Bond, MD;  Location: [facility];  Service: Orthopedics;  Laterality: Left;  ? PILONIDAL CYST EXCISION    ? SHOULDER ARTHROSCOPY WITH DEBRIDEMENT AND BICEP TENDON REPAIR Right 01/18/2019  ? Procedure: RIGHT SHOULDER ARTHROSCOPY WITH DEBRIDEMENT AND DECOMPRSSION SUBACROMIAL PARTIAL ACROMIOPLASTY;  Surgeon: Marchia Bond, MD;  Location: [facility];  Service: Orthopedics;  Laterality: Right;  ? SHOULDER ARTHROSCOPY WITH ROTATOR CUFF REPAIR Right 01/18/2019  ? Procedure: SHOULDER ARTHROSCOPY WITH ROTATOR CUFF REPAIR;  Surgeon: Marchia Bond, MD;  Location: [facility];  Service: Orthopedics;  Laterality: Right;  ? ?Social History  ? ?Socioeconomic History  ? Marital status: Married  ?  Spouse name: [name]  ? Number of children: 4  ? Years of education: Not on file  ? Highest education level: High school graduate  ?Occupational History  ? Occupation: Retired  ?  Employer: UNEMPLOYED  ?Tobacco Use  ? Smoking status:  Former  ?  Packs/day: 0.50  ?  Years: 10.00  ?  Pack years: 5.00  ?  Types: Cigarettes  ?  Quit date: 04/06/1983  ?  Years since quitting: 38.3  ? Smokeless tobacco: Never  ?Vaping Use  ? Vaping Use: Never used  ?Substance and Sexual Activity  ? Alcohol use: Yes  ?  Alcohol/week: 0.0 standard drinks  ?  Comment: `/26/2017 "might drink a beer q couple months mostly; summertime I might drink 2-3 beers/week"  ? Drug use: No  ? Sexual activity: Not Currently  ?Other Topics Concern  ? Not on file  ?Social History Narrative  ? Lives in [place] with spouse Mohit Zirbes).  4 children, grown and healthy  ?   ? 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  ? ?Social Determinants of Health  ? ?Financial Resource Strain: Not on file  ?Food Insecurity: Not on file  ?Transportation Needs: Not on file  ?Physical Activity: Not on file  ?Stress: Not on file  ?Social Connections: Not on file  ?Intimate Partner Violence: Not on file  ? ?Current Outpatient Medications on File Prior to Visit  ?Medication Sig Dispense Refill  ? albuterol (VENTOLIN HFA) 108 (90 Base) MCG/ACT inhaler INHALE 1-2 PUFFS INTO THE LUNGS EVERY 4 HOURS AS NEEDED FOR WHEEZING OR SHORTNESS OF BREATH. 18 each 2  ? aspirin EC 81 MG tablet Take 81 mg by mouth daily. Swallow whole.    ? Blood Glucose Monitoring Suppl (RELION  PREMIER BLU MONITOR) DEVI 1 Device by Does not apply route daily. 1 Device 0  ? COVID-19 mRNA bivalent vaccine, Moderna, (MODERNA COVID-19 BIVAL BOOSTER) 50 MCG/0.5ML injection Inject into the muscle. 0.5 mL 0

## 2021-08-06 NOTE — Patient Instructions (Addendum)
Please continue: ?- Metformin 1000 mg 2x a day ?- Glipizide XL 5 mg 2x a day - break/crush the evening dose ?- Ozempic 0.5 mg weekly ? ?Check sugars at different times of the day. Adjust the time and date on your meter. ? ?Please come back for a follow-up appointment in 4 months. ?

## 2021-08-12 ENCOUNTER — Telehealth: Payer: Self-pay | Admitting: Pharmacist

## 2021-08-12 NOTE — Progress Notes (Signed)
Chronic Care Management Pharmacy Assistant   Name: [name]  MRN: [id number] DOB: 03/19/1951   Reason for Encounter: Disease State - General Adherence Call     Recent office visits:  06/04/21 [name]. MD - Family Medicine - Diabetes - Labs were ordered.   Recent consult visits:  08/06/21 [name] MD - Endocrinology - Diabetes - Labs were ordered. Continue current medications. Follow up in 4 months.   06/01/21 [name] MD - [place] reviewed. Follow up in 4 weeks.   05/19/21 [name] MD - Neurology - Memory loss - No medication changes. Follow up as scheduled.  04/14/21 [name] MD - Urology - Malignant neoplasm of prostate - Labs were ordered. sildenafil (REVATIO) 20 MG tablet prescribed. Follow up in 6 months.   04/07/21 [name] MD - Endocrinology - Diabetes - Labs were ordered. No medication changes. Follow up in 4 months.  Hospital visits:  None in previous 6 months  Medications: Outpatient Encounter Medications as of 08/12/2021  Medication Sig   albuterol (VENTOLIN HFA) 108 (90 Base) MCG/ACT inhaler INHALE 1-2 PUFFS INTO THE LUNGS EVERY 4 HOURS AS NEEDED FOR WHEEZING OR SHORTNESS OF BREATH.   aspirin EC 81 MG tablet Take 81 mg by mouth daily. Swallow whole.   Blood Glucose Monitoring Suppl (RELION PREMIER BLU MONITOR) DEVI 1 Device by Does not apply route daily.   COVID-19 mRNA bivalent vaccine, Moderna, (MODERNA COVID-19 BIVAL BOOSTER) 50 MCG/0.5ML injection Inject into the muscle.   fluticasone (FLONASE) 50 MCG/ACT nasal spray Place 1-2 sprays into both nostrils daily.   glipiZIDE (GLUCOTROL XL) 5 MG 24 hr tablet Take 1 tablet (5 mg total) by mouth 2 (two) times daily.   glucose blood test strip Use as instructed to check sugar 2 times daily   hydrALAZINE (APRESOLINE) 50 MG tablet Take 1 tablet (50 mg total) by mouth 3 (three) times daily.   hydrochlorothiazide (HYDRODIURIL) 25 MG tablet TAKE 1 TABLET BY  MOUTH EVERY DAY   losartan (COZAAR) 100 MG tablet TAKE 1 TABLET BY MOUTH EVERY DAY   metFORMIN (GLUCOPHAGE) 500 MG tablet Take 2 tablets (1,000 mg total) by mouth 2 (two) times daily with a meal.   metoprolol tartrate (LOPRESSOR) 50 MG tablet TAKE 1 TABLET BY MOUTH TWICE A DAY   nitroGLYCERIN (NITROSTAT) 0.4 MG SL tablet PLACE 1 TABLET UNDER THE TONGUE EVERY 5 MIN X 3 DOSES AS NEEDED FOR CHEST PAIN   pravastatin (PRAVACHOL) 40 MG tablet Take 80 mg by mouth daily.   Semaglutide,0.25 or 0.'5MG'$ /DOS, (OZEMPIC, 0.25 OR 0.5 MG/DOSE,) 2 MG/1.5ML SOPN Inject 0.5 mg into the skin once a week.   sertraline (ZOLOFT) 25 MG tablet TAKE 1 TABLET (25 MG TOTAL) BY MOUTH DAILY.   sertraline (ZOLOFT) 50 MG tablet Take 1 tablet (50 mg total) by mouth daily.   sildenafil (REVATIO) 20 MG tablet Take 3-5 tablets 1hr prior to intercourse   No facility-administered encounter medications on file as of 08/12/2021.    [facility] for General Review Call   Chart Review:  Have there been any documented new, changed, or discontinued medications since last visit? No (If yes, include name, dose, frequency, date) Has there been any documented recent hospitalizations or ED visits since last visit with Clinical Pharmacist? No Brief Summary (including medication and/or Diagnosis changes):   Adherence Review:  Does the Clinical Pharmacist Assistant have access to adherence rates? Yes Adherence rates for STAR metric medications (List  medication(s)/day supply/ last 2 fill dates). Adherence rates for medications indicated for disease state being reviewed (List medication(s)/day supply/ last 2 fill dates). Does the patient have >5 day gap between last estimated fill dates for any of the above medications or other medication gaps? No Reason for medication gaps.   Disease State Questions:  Able to connect with Patient? No Did patient have any problems with their health recently?  Note problems and  Concerns: Have you had any admissions or emergency room visits or worsening of your condition(s) since last visit?  Details of ED visit, hospital visit and/or worsening condition(s): Have you had any visits with new specialists or providers since your last visit?  Explain: Have you had any new health care problem(s) since your last visit?  New problem(s) reported: Have you run out of any of your medications since you last spoke with clinical pharmacist?  What caused you to run out of your medications? Are there any medications you are not taking as prescribed?  What kept you from taking your medications as prescribed? Are you having any issues or side effects with your medications?  Note of issues or side effects: Do you have any other health concerns or questions you want to discuss with your Clinical Pharmacist before your next visit?  Note additional concerns and questions from Patient. Are there any health concerns that you feel we can do a better job addressing?  Note Patient's response. Are you having any problems with any of the following since the last visit: (select all that apply)    Details: 12. Any falls since last visit?   Details: 13. Any increased or uncontrolled pain since last visit?   Details: 14. Next visit Type:        Visit with: [name]         Date: 08/18/21        Time: 3:20 pm  15. Additional Details?    Care Gaps   AWV: 02/02/21 Colonoscopy:  due 08/01/24 DM Eye Exam: due 04/02/21 DM Foot Exam: unknown  Microalbumin: overdue  HbgAIC: done 08/06/21 (7.0) DEXA: never  Mammogram: N/A     Star Rating Drugs: pravastatin (PRAVACHOL) 40 MG tablet - last filled 02/04/21 90 days  metFORMIN (GLUCOPHAGE) 500 MG tablet - last filled 02/14/21 90 days  losartan (COZAAR) 100 MG tablet - last filled 06/06/21 90 days  glipiZIDE (GLUCOTROL XL) 5 MG 24 hr tablet - last filled 06/17/21 90 days   Future Appointments  Date Time Provider [place]  08/18/2021   3:20 PM Belva Crome, MD [facility]  10/12/2021 10:00 AM [facility] None  12/09/2021  1:40 PM Wendie Agreste, MD [facility]  12/24/2021  2:20 PM [name], MD [facility] None  04/14/2022 10:00 AM [name], MD [facility] None   Multiple attempts were made to contact patient. Attempts were unsuccessful. / ls,CMA   Jobe Gibbon, [facility] Pharmacist Assistant  [phone]

## 2021-08-17 NOTE — Progress Notes (Signed)
?Cardiology Office Note:   ? ?Date:  08/18/2021  ? ?ID:  CULLEY HEDEEN, DOB 06/13/1950, MRN [id number] ? ?PCP:  Wendie Agreste, MD  ?Cardiologist:  [name], MD  ? ?Referring MD: Wendie Agreste, MD  ? ?Chief Complaint  ?Patient presents with  ? Coronary Artery Disease  ? Follow-up  ?  Intolerance of statins  ? Hyperlipidemia  ? Hypertension  ? ? ?History of Present Illness:   ? ?[name] is a 71 y.o. male with a hx of CAD s/p DES to LCx 11/2014, OM1 not stented due to inability of stent to track (likely due to wire wrapping), HTN, HLD, OSA not on CPAP and PAT/PAF (not on anticoagulation due to low burden). ? ? ?Mr. Shepherd is doing well.  He is not having angina. ? ?His LDL cholesterol is 126.  This is far above the target which should be less than 70 and ideally less than 55.  Even though pravastatin is on his medication list, he is not taking it.  I was called by an Buyer, retail nurse that also notes that he is not picking up his prescriptions.  He states that every statin he has been on causes him to get profile muscle soreness and weakness.  He feels like "an old man".  He says that the same thing also happened when he was placed on Repatha.  He had muscle weakness and soreness and discontinued the medication.  He is not interested in going back on the medication at this time. ? ?Denies orthopnea, PND, lower extremity swelling. ? ?Past Medical History:  ?Diagnosis Date  ? Adenomatous colon polyp 2011  ? Anemia   ? Angina pectoris ([place]) 05/01/2015  ? med rx for 95% OM (u/a to access due to tortuosity), 95% D1 and other moderate CAD at cath  ? Arthritis   ? "back, knees" (05/01/2015)  ? Chest pain   ? pleuritic  ? Chronic lower back pain   ? Complex tear of medial meniscus of left knee 09/14/2018  ? Coronary artery disease   ? Depression   ? GERD (gastroesophageal reflux disease)   ? HTN (hypertension)   ? Hyperlipidemia   ? Obstructive sleep apnea   ? noncompliant with CPAP  ? Osteoarthritis    ? Pneumonia ~ 2014 X 1  ? Prostate cancer ([place])   ? Refusal of blood transfusions as patient is Jehovah's Witness   ? Rotator cuff arthropathy, right   ? Rupture of right supraspinatus tendon 01/18/2019  ? Type II or unspecified type diabetes mellitus without mention of complication, not stated as uncontrolled   ? ? ? ? ? ?Cardiology Office Note:   ? ?Date:  08/18/2021  ? ?ID:  LOVELLE LEMA, DOB 1950-09-20, MRN [id number] ? ?PCP:  Wendie Agreste, MD  ?Cardiologist:  [name], MD  ? ?Referring MD: Wendie Agreste, MD  ? ?Chief Complaint  ?Patient presents with  ? Coronary Artery Disease  ? Follow-up  ?  Intolerance of statins  ? Hyperlipidemia  ? Hypertension  ? ? ?History of Present Illness:   ? ?[name] is a 71 y.o. male with a hx of with a hx of CAD s/p DES to LCx 11/2014, OM1 not stented due to inability of stent to track (likely due to wire wrapping), HTN, HLD, OSA not on CPAP and PAT/PAF (not on anticoagulation due to low burden). ? ? ?Mr. Ksiazek is doing well.  He  is not having angina. ? ?His LDL cholesterol is 126.  This is far above the target which should be less than 70 and ideally less than 55.  Even though pravastatin is on his medication list, he is not taking it.  I was called by an Buyer, retail nurse that also notes that he is not picking up his prescriptions.  He states that every statin he has been on causes him to get profile muscle soreness and weakness.  He feels like "an old man".  He says that the same thing also happened when he was placed on Repatha.  He had muscle weakness and soreness and discontinued the medication.  He is not interested in going back on the medication at this time. ? ?Denies orthopnea, PND, lower extremity swelling. ? ?Past Medical History:  ?Diagnosis Date  ? Adenomatous colon polyp 2011  ? Anemia   ? Angina pectoris ([place]) 05/01/2015  ? med rx for 95% OM (u/a to access due to tortuosity), 95% D1 and other moderate CAD at cath  ? Arthritis   ?  "back, knees" (05/01/2015)  ? Chest pain   ? pleuritic  ? Chronic lower back pain   ? Complex tear of medial meniscus of left knee 09/14/2018  ? Coronary artery disease   ? Depression   ? GERD (gastroesophageal reflux disease)   ? HTN (hypertension)   ? Hyperlipidemia   ? Obstructive sleep apnea   ? noncompliant with CPAP  ? Osteoarthritis   ? Pneumonia ~ 2014 X 1  ? Prostate cancer ([place])   ? Refusal of blood transfusions as patient is Jehovah's Witness   ? Rotator cuff arthropathy, right   ? Rupture of right supraspinatus tendon 01/18/2019  ? Type II or unspecified type diabetes mellitus without mention of complication, not stated as uncontrolled   ? ? ?Past Surgical History:  ?Procedure Laterality Date  ? CARDIAC CATHETERIZATION  2004  ? patent coronary arteries  ? CARDIAC CATHETERIZATION  05/01/2015  ? "tried to put stent in but couldn't"  ? CARDIAC CATHETERIZATION N/A 05/01/2015  ? Procedure: Left Heart Cath and Coronary Angiography;  Surgeon: Belva Crome, MD; LAD 60%, oD1 90%, pD1 70%, D2 70%, CFX patent stent, 30% distal to prev stent, pRCA 20%, OM1 90/95%, NL LV  ? CARDIAC CATHETERIZATION N/A 05/01/2015  ? Procedure: Coronary Stent Intervention;  Surgeon: Belva Crome, MD;  Unsuccessful PCI OM due to tortuosity  ? CARDIAC CATHETERIZATION N/A 11/06/2014  ? Procedure: Left Heart Cath and Coronary Angiography;  Surgeon: Belva Crome, MD;  Location: [facility];  Service: Cardiovascular;  Laterality: N/A;  ? CLOSED REDUCTION SHOULDER DISLOCATION Right ~ 1975  ? "& reattached muscle"  ? COLONOSCOPY W/ BIOPSIES AND POLYPECTOMY  X 2  ? ESOPHAGOGASTRODUODENOSCOPY ENDOSCOPY    ? KNEE ARTHROSCOPY WITH MEDIAL MENISECTOMY Left 09/14/2018  ? Procedure: LEFT KNEE ARTHROSCOPY CHONDROPLASY,  WITH MEDIAL MENISECTOMY;  Surgeon: Marchia Bond, MD;  Location: [facility];  Service: Orthopedics;  Laterality: Left;  ? PILONIDAL CYST EXCISION    ? SHOULDER ARTHROSCOPY WITH DEBRIDEMENT AND BICEP TENDON REPAIR Right  01/18/2019  ? Procedure: RIGHT SHOULDER ARTHROSCOPY WITH DEBRIDEMENT AND DECOMPRSSION SUBACROMIAL PARTIAL ACROMIOPLASTY;  Surgeon: Marchia Bond, MD;  Location: [facility];  Service: Orthopedics;  Laterality: Right;  ? SHOULDER ARTHROSCOPY WITH ROTATOR CUFF REPAIR Right 01/18/2019  ? Procedure: SHOULDER ARTHROSCOPY WITH ROTATOR CUFF REPAIR;  Surgeon: Marchia Bond, MD;  Location: [facility];  Service:  Orthopedics;  Laterality: Right;  ? ? ?Current Medications: ?Current Meds  ?Medication Sig  ? albuterol (VENTOLIN HFA) 108 (90 Base) MCG/ACT inhaler INHALE 1-2 PUFFS INTO THE LUNGS EVERY 4 HOURS AS NEEDED FOR WHEEZING OR SHORTNESS OF BREATH.  ? aspirin EC 81 MG tablet Take 81 mg by mouth daily. Swallow whole.  ? Blood Glucose Monitoring Suppl (RELION PREMIER BLU MONITOR) DEVI 1 Device by Does not apply route daily.  ? COVID-19 mRNA bivalent vaccine, Moderna, (MODERNA COVID-19 BIVAL BOOSTER) 50 MCG/0.5ML injection Inject into the muscle.  ? fluticasone (FLONASE) 50 MCG/ACT nasal spray Place 1-2 sprays into both nostrils daily.  ? glipiZIDE (GLUCOTROL XL) 5 MG 24 hr tablet Take 1 tablet (5 mg total) by mouth 2 (two) times daily.  ? glucose blood test strip Use as instructed to check sugar 2 times daily  ? hydrALAZINE (APRESOLINE) 50 MG tablet Take 1 tablet (50 mg total) by mouth 3 (three) times daily.  ? hydrochlorothiazide (HYDRODIURIL) 25 MG tablet TAKE 1 TABLET BY MOUTH EVERY DAY  ? losartan (COZAAR) 100 MG tablet TAKE 1 TABLET BY MOUTH EVERY DAY  ? metFORMIN (GLUCOPHAGE) 500 MG tablet Take 2 tablets (1,000 mg total) by mouth 2 (two) times daily with a meal.  ? metoprolol tartrate (LOPRESSOR) 50 MG tablet TAKE 1 TABLET BY MOUTH TWICE A DAY  ? nitroGLYCERIN (NITROSTAT) 0.4 MG SL tablet PLACE 1 TABLET UNDER THE TONGUE EVERY 5 MIN X 3 DOSES AS NEEDED FOR CHEST PAIN  ? pravastatin (PRAVACHOL) 40 MG tablet Take 80 mg by mouth daily.  ? Semaglutide,0.25 or 0.'5MG'$ /DOS, (OZEMPIC, 0.25 OR 0.5  MG/DOSE,) 2 MG/1.5ML SOPN Inject 0.5 mg into the skin once a week.  ? sertraline (ZOLOFT) 25 MG tablet TAKE 1 TABLET (25 MG TOTAL) BY MOUTH DAILY.  ? sertraline (ZOLOFT) 50 MG tablet Take 1 tablet (50 mg tota

## 2021-08-18 ENCOUNTER — Ambulatory Visit (INDEPENDENT_AMBULATORY_CARE_PROVIDER_SITE_OTHER): Payer: Federal, State, Local not specified - PPO | Admitting: Interventional Cardiology

## 2021-08-18 ENCOUNTER — Encounter: Payer: Self-pay | Admitting: Interventional Cardiology

## 2021-08-18 VITALS — BP 142/82 | HR 61 | Ht 70.0 in | Wt 251.8 lb

## 2021-08-18 DIAGNOSIS — I2510 Atherosclerotic heart disease of native coronary artery without angina pectoris: Secondary | ICD-10-CM

## 2021-08-18 DIAGNOSIS — I10 Essential (primary) hypertension: Secondary | ICD-10-CM | POA: Diagnosis not present

## 2021-08-18 DIAGNOSIS — I480 Paroxysmal atrial fibrillation: Secondary | ICD-10-CM | POA: Diagnosis not present

## 2021-08-18 DIAGNOSIS — Z794 Long term (current) use of insulin: Secondary | ICD-10-CM | POA: Diagnosis not present

## 2021-08-18 DIAGNOSIS — E1159 Type 2 diabetes mellitus with other circulatory complications: Secondary | ICD-10-CM

## 2021-08-18 DIAGNOSIS — G4733 Obstructive sleep apnea (adult) (pediatric): Secondary | ICD-10-CM | POA: Diagnosis not present

## 2021-08-18 DIAGNOSIS — T466X5A Adverse effect of antihyperlipidemic and antiarteriosclerotic drugs, initial encounter: Secondary | ICD-10-CM

## 2021-08-18 DIAGNOSIS — G720 Drug-induced myopathy: Secondary | ICD-10-CM | POA: Diagnosis not present

## 2021-08-18 DIAGNOSIS — E785 Hyperlipidemia, unspecified: Secondary | ICD-10-CM | POA: Diagnosis not present

## 2021-08-18 NOTE — Patient Instructions (Signed)

## 2021-08-25 ENCOUNTER — Other Ambulatory Visit: Payer: Self-pay | Admitting: Internal Medicine

## 2021-08-25 DIAGNOSIS — E1159 Type 2 diabetes mellitus with other circulatory complications: Secondary | ICD-10-CM

## 2021-08-25 MED ORDER — SEMAGLUTIDE(0.25 OR 0.5MG/DOS) 2 MG/3ML ~~LOC~~ SOPN: 0.5000 mg | PEN_INJECTOR | SUBCUTANEOUS | 3 refills | Status: DC

## 2021-08-26 DIAGNOSIS — G4733 Obstructive sleep apnea (adult) (pediatric): Secondary | ICD-10-CM | POA: Diagnosis not present

## 2021-09-12 ENCOUNTER — Ambulatory Visit
Admission: EM | Admit: 2021-09-12 | Discharge: 2021-09-12 | Disposition: A | Discharge status: Home / Self Care | Payer: Federal, State, Local not specified - PPO | Attending: Urgent Care | Admitting: Urgent Care

## 2021-09-12 DIAGNOSIS — E1159 Type 2 diabetes mellitus with other circulatory complications: Secondary | ICD-10-CM

## 2021-09-12 DIAGNOSIS — B349 Viral infection, unspecified: Secondary | ICD-10-CM | POA: Diagnosis not present

## 2021-09-12 DIAGNOSIS — E119 Type 2 diabetes mellitus without complications: Secondary | ICD-10-CM

## 2021-09-12 DIAGNOSIS — I25709 Atherosclerosis of coronary artery bypass graft(s), unspecified, with unspecified angina pectoris: Secondary | ICD-10-CM | POA: Diagnosis not present

## 2021-09-12 MED ORDER — CETIRIZINE HCL 10 MG PO TABS: 10.0000 mg | ORAL_TABLET | Freq: Every day | ORAL | 0 refills | Status: DC

## 2021-09-12 MED ORDER — ACETAMINOPHEN 325 MG PO TABS: 650.0000 mg | ORAL_TABLET | Freq: Four times a day (QID) | ORAL | 0 refills | Status: DC | PRN

## 2021-09-12 MED ORDER — ONDANSETRON 8 MG PO TBDP: 8.0000 mg | ORAL_TABLET | Freq: Three times a day (TID) | ORAL | 0 refills | Status: DC | PRN

## 2021-09-12 MED ORDER — LOPERAMIDE HCL 2 MG PO CAPS
2.0000 mg | ORAL_CAPSULE | Freq: Two times a day (BID) | ORAL | 0 refills | Status: DC | PRN
Start: 2021-09-12 — End: 2021-11-15

## 2021-09-12 NOTE — ED Provider Notes (Addendum)
[place]   MRN: [id number] DOB: [birth year]  Subjective:   [name] is a 71 y.o. male presenting for 4-day history of acute onset persistent body aches, postnasal drainage, nausea, diarrhea, vomiting.  Yesterday he had an episode of indigestion, lower chest pain, upper abdominal pain.  He had some burping associated with it.  He took a COVID test and was negative.  He actually ended up taking 2 nitroglycerin tablets but did not go to the ER.  Has been using Imodium and Tums.  Has a history of coronary artery disease, has had multiple heart catheterizations.  He is also diabetic treated without insulin.  No active chest pain or abdominal pain.  No shortness of breath, wheezing.  No current facility-administered medications for this encounter.  Current Outpatient Medications:    albuterol (VENTOLIN HFA) 108 (90 Base) MCG/ACT inhaler, INHALE 1-2 PUFFS INTO THE LUNGS EVERY 4 HOURS AS NEEDED FOR WHEEZING OR SHORTNESS OF BREATH., Disp: 18 each, Rfl: 2   aspirin EC 81 MG tablet, Take 81 mg by mouth daily. Swallow whole., Disp: , Rfl:    Blood Glucose Monitoring Suppl (RELION PREMIER BLU MONITOR) DEVI, 1 Device by Does not apply route daily., Disp: 1 Device, Rfl: 0   COVID-19 mRNA bivalent vaccine, Moderna, (MODERNA COVID-19 BIVAL BOOSTER) 50 MCG/0.5ML injection, Inject into the muscle., Disp: 0.5 mL, Rfl: 0   fluticasone (FLONASE) 50 MCG/ACT nasal spray, Place 1-2 sprays into both nostrils daily., Disp: 48 mL, Rfl: 2   glipiZIDE (GLUCOTROL XL) 5 MG 24 hr tablet, Take 1 tablet (5 mg total) by mouth 2 (two) times daily., Disp: 180 tablet, Rfl: 3   glucose blood test strip, Use as instructed to check sugar 2 times daily, Disp: 200 each, Rfl: 5   hydrALAZINE (APRESOLINE) 50 MG tablet, Take 1 tablet (50 mg total) by mouth 3 (three) times daily., Disp: 270 tablet, Rfl: 3   hydrochlorothiazide (HYDRODIURIL) 25 MG tablet, TAKE 1 TABLET BY MOUTH EVERY DAY, Disp: 90 tablet, Rfl:  3   losartan (COZAAR) 100 MG tablet, TAKE 1 TABLET BY MOUTH EVERY DAY, Disp: 90 tablet, Rfl: 3   metFORMIN (GLUCOPHAGE) 500 MG tablet, Take 2 tablets (1,000 mg total) by mouth 2 (two) times daily with a meal., Disp: 360 tablet, Rfl: 3   metoprolol tartrate (LOPRESSOR) 50 MG tablet, TAKE 1 TABLET BY MOUTH TWICE A DAY, Disp: 180 tablet, Rfl: 1   nitroGLYCERIN (NITROSTAT) 0.4 MG SL tablet, PLACE 1 TABLET UNDER THE TONGUE EVERY 5 MIN X 3 DOSES AS NEEDED FOR CHEST PAIN, Disp: 75 tablet, Rfl: 2   pravastatin (PRAVACHOL) 40 MG tablet, Take 80 mg by mouth daily., Disp: , Rfl:    Semaglutide,0.25 or 0.'5MG'$ /DOS, 2 MG/3ML SOPN, Inject 0.5 mg into the skin once a week., Disp: 3 mL, Rfl: 3   sertraline (ZOLOFT) 25 MG tablet, TAKE 1 TABLET (25 MG TOTAL) BY MOUTH DAILY., Disp: 90 tablet, Rfl: 1   sertraline (ZOLOFT) 50 MG tablet, Take 1 tablet (50 mg total) by mouth daily., Disp: 90 tablet, Rfl: 1   sildenafil (REVATIO) 20 MG tablet, Take 3-5 tablets 1hr prior to intercourse, Disp: 60 tablet, Rfl: 3   Allergies  Allergen Reactions   Statins Other (See Comments)    REACTION: joint pain Lipitor- headaches Has also tried Livalo, pravachol, zetia, crestor, welchol   Imdur [Isosorbide Dinitrate]     Headache - "violent"    Past Medical History:  Diagnosis Date   Adenomatous colon  polyp 2011   Anemia    Angina pectoris ([place]) 05/01/2015   med rx for 95% OM (u/a to access due to tortuosity), 95% D1 and other moderate CAD at cath   Arthritis    "back, knees" (05/01/2015)   Chest pain    pleuritic   Chronic lower back pain    Complex tear of medial meniscus of left knee 09/14/2018   Coronary artery disease    Depression    GERD (gastroesophageal reflux disease)    HTN (hypertension)    Hyperlipidemia    Obstructive sleep apnea    noncompliant with CPAP   Osteoarthritis    Pneumonia ~ 2014 X 1   Prostate cancer ([place])    Refusal of blood transfusions as patient is Jehovah's Witness    Rotator cuff  arthropathy, right    Rupture of right supraspinatus tendon 01/18/2019   Type II or unspecified type diabetes mellitus without mention of complication, not stated as uncontrolled      Past Surgical History:  Procedure Laterality Date   CARDIAC CATHETERIZATION  2004   patent coronary arteries   CARDIAC CATHETERIZATION  05/01/2015   "tried to put stent in but couldn't"   CARDIAC CATHETERIZATION N/A 05/01/2015   Procedure: Left Heart Cath and Coronary Angiography;  Surgeon: Belva Crome, MD; LAD 60%, oD1 90%, pD1 70%, D2 70%, CFX patent stent, 30% distal to prev stent, pRCA 20%, OM1 90/95%, NL LV   CARDIAC CATHETERIZATION N/A 05/01/2015   Procedure: Coronary Stent Intervention;  Surgeon: Belva Crome, MD;  Unsuccessful PCI OM due to tortuosity   CARDIAC CATHETERIZATION N/A 11/06/2014   Procedure: Left Heart Cath and Coronary Angiography;  Surgeon: Belva Crome, MD;  Location: [facility];  Service: Cardiovascular;  Laterality: N/A;   CLOSED REDUCTION SHOULDER DISLOCATION Right ~ 1975   "& reattached muscle"   COLONOSCOPY W/ BIOPSIES AND POLYPECTOMY  X 2   ESOPHAGOGASTRODUODENOSCOPY ENDOSCOPY     KNEE ARTHROSCOPY WITH MEDIAL MENISECTOMY Left 09/14/2018   Procedure: LEFT KNEE ARTHROSCOPY CHONDROPLASY,  WITH MEDIAL MENISECTOMY;  Surgeon: Marchia Bond, MD;  Location: [facility];  Service: Orthopedics;  Laterality: Left;   PILONIDAL CYST EXCISION     SHOULDER ARTHROSCOPY WITH DEBRIDEMENT AND BICEP TENDON REPAIR Right 01/18/2019   Procedure: RIGHT SHOULDER ARTHROSCOPY WITH DEBRIDEMENT AND DECOMPRSSION SUBACROMIAL PARTIAL ACROMIOPLASTY;  Surgeon: Marchia Bond, MD;  Location: [facility];  Service: Orthopedics;  Laterality: Right;   SHOULDER ARTHROSCOPY WITH ROTATOR CUFF REPAIR Right 01/18/2019   Procedure: SHOULDER ARTHROSCOPY WITH ROTATOR CUFF REPAIR;  Surgeon: Marchia Bond, MD;  Location: [facility];  Service: Orthopedics;  Laterality: Right;     Family History  Problem Relation Age of Onset   Coronary artery disease Other        CABG   Coronary artery disease Mother    Stroke Mother    Heart disease Mother    Diabetes Mother    Other Father    Diabetes Sister    Diabetes Maternal Uncle        x2   Alcohol abuse Other    Diabetes Paternal Uncle        x2   Colon cancer Paternal Uncle    Colon polyps Neg Hx    Esophageal cancer Neg Hx     Social History   Tobacco Use   Smoking status: Former    Packs/day: 0.50    Years: 10.00    Total pack years:  5.00    Types: Cigarettes    Quit date: 04/06/1983    Years since quitting: 38.4   Smokeless tobacco: Never  Vaping Use   Vaping Use: Never used  Substance Use Topics   Alcohol use: Yes    Alcohol/week: 0.0 standard drinks of alcohol    Comment: `/26/2017 "might drink a beer q couple months mostly; summertime I might drink 2-3 beers/week"   Drug use: No    ROS   Objective:   Vitals: BP 125/70 (BP Location: Left Arm)   Pulse 76   Temp 97.8 F (36.6 C) (Oral)   Resp 18   SpO2 94%   Physical Exam Constitutional:      General: He is not in acute distress.    Appearance: Normal appearance. He is well-developed and normal weight. He is not ill-appearing, toxic-appearing or diaphoretic.  HENT:     Head: Normocephalic and atraumatic.     Right Ear: Tympanic membrane, ear canal and external ear normal. There is no impacted cerumen.     Left Ear: Tympanic membrane, ear canal and external ear normal. There is no impacted cerumen.     Nose: Nose normal. No congestion or rhinorrhea.     Mouth/Throat:     Mouth: Mucous membranes are moist.     Pharynx: No oropharyngeal exudate or posterior oropharyngeal erythema.  Eyes:     General: No scleral icterus.       Right eye: No discharge.        Left eye: No discharge.     Extraocular Movements: Extraocular movements intact.     Conjunctiva/sclera: Conjunctivae normal.  Cardiovascular:     Rate and Rhythm:  Normal rate and regular rhythm.     Heart sounds: Normal heart sounds. No murmur heard.    No friction rub. No gallop.  Pulmonary:     Effort: Pulmonary effort is normal. No respiratory distress.     Breath sounds: Normal breath sounds. No stridor. No wheezing, rhonchi or rales.  Abdominal:     General: Bowel sounds are normal. There is no distension.     Palpations: Abdomen is soft. There is no mass.     Tenderness: There is no abdominal tenderness. There is no right CVA tenderness, left CVA tenderness, guarding or rebound.  Musculoskeletal:     Cervical back: Normal range of motion and neck supple. No rigidity. No muscular tenderness.  Skin:    General: Skin is warm and dry.  Neurological:     General: No focal deficit present.     Mental Status: He is alert and oriented to person, place, and time.     Cranial Nerves: No cranial nerve deficit.     Motor: No weakness.     Coordination: Coordination normal.     Gait: Gait normal.     Deep Tendon Reflexes: Reflexes normal.  Psychiatric:        Mood and Affect: Mood normal.        Behavior: Behavior normal.        Thought Content: Thought content normal.        Judgment: Judgment normal.    ED ECG REPORT   Date: 09/12/2021  EKG Time: 3:00 PM  Rate: 68bpm  Rhythm: normal sinus rhythm,  unchanged from previous tracings  Axis: left  Intervals:first-degree A-V block   ST&T Change: T wave flattening in lead I-III, aVL  Narrative Interpretation: Sinus rhythm with first-degree AV block, premature atrial complexes, nonspecific T wave flattening.  EKG is very comparable to the previous one from a month ago.   Assessment and Plan :   PDMP not reviewed this encounter.  1. Acute viral syndrome   2. Coronary artery disease involving coronary bypass graft with angina pectoris, unspecified whether native or transplanted heart ([place])   3. Type 2 diabetes mellitus treated without insulin ([place])    COVID and flu test pending.  We will  otherwise manage for an acute viral syndrome.  Physical exam findings reassuring and vital signs stable for discharge. Advised supportive care, offered symptomatic relief.  No signs of ACS but emphasized need for strict ER precautions. Deferred imaging given clear cardiopulmonary exam, hemodynamically stable vital signs. Counseled patient on potential for adverse effects with medications prescribed/recommended today, ER and return-to-clinic precautions discussed, patient verbalized understanding.      Jaynee Eagles, [place] 09/12/21 1503

## 2021-09-12 NOTE — Discharge Instructions (Signed)
We will notify you of your test results as they arrive and may take between 48-72 hours.  I encourage you to sign up for MyChart if you have not already done so as this can be the easiest way for [place] to communicate results to you online or through a phone app.  Generally, we only contact you if it is a positive test result.  In the meantime, if you develop worsening symptoms including fever, chest pain, shortness of breath despite our current treatment plan then please report to the emergency room as this may be a sign of worsening status from possible viral infection.  Otherwise, we will manage this as a viral syndrome. For sore throat or cough try using a honey-based tea. Use 3 teaspoons of honey with juice squeezed from half lemon. Place shaved pieces of ginger into 1/2-1 cup of water and warm over stove top. Then mix the ingredients and repeat every 4 hours as needed. Please take Tylenol '500mg'$ -'650mg'$  every 6 hours for aches and pains, fevers. Hydrate very well with at least 2 liters of water. Eat light meals such as soups to replenish electrolytes and soft fruits, veggies. Start an antihistamine like Zyrtec for postnasal drainage, sinus congestion.  You can take this together with Zofran and Imodium for the nausea, vomiting and diarrhea.

## 2021-09-12 NOTE — ED Triage Notes (Addendum)
Pt c/o abd pain (ache), diarrhea, headache, generalized body aches, indigestion and nausea/ vomiting. Patient states his at home Covid test was negative yesterday.  Started: 4 days ago  Home interventions: imodium, tums

## 2021-09-12 NOTE — ED Notes (Signed)
EKG results given to provider.  

## 2021-09-13 LAB — COVID-19, FLU A+B NAA
Influenza A, NAA: NOT DETECTED
Influenza B, NAA: NOT DETECTED
SARS-CoV-2, NAA: NOT DETECTED

## 2021-09-15 ENCOUNTER — Other Ambulatory Visit: Payer: Self-pay | Admitting: Internal Medicine

## 2021-09-15 ENCOUNTER — Other Ambulatory Visit: Payer: Self-pay | Admitting: Interventional Cardiology

## 2021-09-17 ENCOUNTER — Encounter: Payer: Self-pay | Admitting: Interventional Cardiology

## 2021-09-20 ENCOUNTER — Ambulatory Visit (HOSPITAL_COMMUNITY)
Admission: EM | Admit: 2021-09-20 | Discharge: 2021-09-20 | Disposition: A | Discharge status: Home / Self Care | Payer: Federal, State, Local not specified - PPO | Attending: Nurse Practitioner | Admitting: Nurse Practitioner

## 2021-09-20 ENCOUNTER — Telehealth (HOSPITAL_COMMUNITY): Payer: Self-pay

## 2021-09-20 DIAGNOSIS — M25562 Pain in left knee: Secondary | ICD-10-CM | POA: Diagnosis not present

## 2021-09-20 DIAGNOSIS — W19XXXA Unspecified fall, initial encounter: Secondary | ICD-10-CM

## 2021-09-20 MED ORDER — TIZANIDINE HCL 2 MG PO TABS: 2.0000 mg | ORAL_TABLET | Freq: Three times a day (TID) | ORAL | 0 refills | Status: DC | PRN
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
## 2021-09-20 NOTE — Discharge Instructions (Addendum)
-   I am concerned that you may have injured a ligament or tendon around your left knee today -Please wear the knee immobilizer until you follow-up with an orthopedic provider -You can use tizanidine every 8 hours as needed for pain as well as Tylenol arthritis every 8 hours as needed -Please follow-up with [facility] urgent care first thing in the morning

## 2021-09-23 DIAGNOSIS — M25561 Pain in right knee: Secondary | ICD-10-CM | POA: Diagnosis not present

## 2021-09-23 DIAGNOSIS — M25562 Pain in left knee: Secondary | ICD-10-CM | POA: Diagnosis not present

## 2021-09-23 DIAGNOSIS — M25512 Pain in left shoulder: Secondary | ICD-10-CM | POA: Diagnosis not present

## 2021-09-26 DIAGNOSIS — G4733 Obstructive sleep apnea (adult) (pediatric): Secondary | ICD-10-CM | POA: Diagnosis not present

## 2021-09-26 DIAGNOSIS — M25512 Pain in left shoulder: Secondary | ICD-10-CM | POA: Diagnosis not present

## 2021-09-26 DIAGNOSIS — M25562 Pain in left knee: Secondary | ICD-10-CM | POA: Diagnosis not present

## 2021-09-29 ENCOUNTER — Telehealth: Payer: Self-pay

## 2021-09-29 DIAGNOSIS — Z9189 Other specified personal risk factors, not elsewhere classified: Secondary | ICD-10-CM

## 2021-09-30 ENCOUNTER — Encounter: Payer: Self-pay | Admitting: Pharmacist

## 2021-09-30 ENCOUNTER — Ambulatory Visit (INDEPENDENT_AMBULATORY_CARE_PROVIDER_SITE_OTHER): Payer: Federal, State, Local not specified - PPO | Admitting: Orthopaedic Surgery

## 2021-09-30 ENCOUNTER — Telehealth: Payer: Self-pay | Admitting: Orthopaedic Surgery

## 2021-09-30 VITALS — Ht 69.5 in | Wt 245.8 lb

## 2021-09-30 DIAGNOSIS — S76112A Strain of left quadriceps muscle, fascia and tendon, initial encounter: Secondary | ICD-10-CM | POA: Diagnosis not present
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
## 2021-09-30 NOTE — Telephone Encounter (Signed)
Patient came in today at 2:50 to drop off MRI paperwork from [facility] for Dr. Erlinda Hong it is in the black tray at the front desk. Has sticky note with name and MRN on it

## 2021-10-05 ENCOUNTER — Telehealth: Payer: Self-pay | Admitting: Pharmacist

## 2021-10-05 NOTE — Progress Notes (Signed)
Chronic Care Management Pharmacy Assistant   Name: [name]  MRN: [id number] DOB: [birth year]   Reason for Encounter: Disease State - General Adherence Call     Recent office visits:  06/04/21 [name]. MD - Family Medicine - Diabetes - Labs were ordered.    Recent consult visits:  09/30/21 Ronald Shown, MD - Orthopedics - Traumatic rupture of left quad - MRI results reviewed. Follow up as scheduled.   08/18/21 Ronald Schick, MD - Cardiology - CAD - EKG performed. No medication changes. Follow up in 1 year.   08/06/21 Ronald Kingdom MD - Endocrinology - Diabetes - Labs were ordered. Continue current medications. Follow up in 4 months.    06/01/21 Ronald Sinning MD - [place] reviewed. Follow up in 4 weeks.    05/19/21 Ronald Spearman MD - Neurology - Memory loss - No medication changes. Follow up as scheduled.   04/14/21 Ronald Espy MD - Urology - Malignant neoplasm of prostate - Labs were ordered. sildenafil (REVATIO) 20 MG tablet prescribed. Follow up in 6 months.    04/07/21 Ronald Kingdom MD - Endocrinology - Diabetes - Labs were ordered. No medication changes. Follow up in 4 months.  Hospital visits: 09/20/21 Medication Reconciliation was completed by comparing discharge summary, patient's EMR and Pharmacy list, and upon discussion with patient.  Admitted to the hospital on 09/20/21 due to Fall. Discharge date was 09/20/21. Discharged from [facility].  New?Medications Started at [facility] Discharge:?? tiZANidine (ZANAFLEX) 2 MG tablet  Medication Changes at Hospital Discharge: None noted.   Medications Discontinued at Hospital Discharge: None noted.   Medications that remain the same after Hospital Discharge:??  All other medications will remain the same.    Hospital visits: 09/12/21 Medication Reconciliation was completed by comparing discharge summary, patient's EMR and Pharmacy list, and upon discussion with  patient.  Admitted to the hospital on 09/12/21 due to Viral syndrome. Discharge date was 09/12/21. Discharged from [facility].  New?Medications Started at [facility] Discharge:?? ondansetron (ZOFRAN-ODT) 8 MG disintegrating tablet loperamide (IMODIUM) 2 MG capsule cetirizine (ZYRTEC ALLERGY) 10 MG tablet acetaminophen (TYLENOL) 325 MG tablet  Medication Changes at Hospital Discharge: None noted.   Medications Discontinued at Hospital Discharge: None noted.   Medications that remain the same after Hospital Discharge:??  All other medications will remain the same.  Medications: Outpatient Encounter Medications as of 10/05/2021  Medication Sig Note   acetaminophen (TYLENOL) 325 MG tablet Take 2 tablets (650 mg total) by mouth every 6 (six) hours as needed for moderate pain.    albuterol (VENTOLIN HFA) 108 (90 Base) MCG/ACT inhaler INHALE 1-2 PUFFS INTO THE LUNGS EVERY 4 HOURS AS NEEDED FOR WHEEZING OR SHORTNESS OF BREATH.    aspirin EC 81 MG tablet Take 81 mg by mouth daily. Swallow whole.    Blood Glucose Monitoring Suppl (RELION PREMIER BLU MONITOR) DEVI 1 Device by Does not apply route daily.    cetirizine (ZYRTEC ALLERGY) 10 MG tablet Take 1 tablet (10 mg total) by mouth daily. 09/29/2021: Patient take 10 mg once daily PRN.    COVID-19 mRNA bivalent vaccine, Moderna, (MODERNA COVID-19 BIVAL BOOSTER) 50 MCG/0.5ML injection Inject into the muscle. (Patient not taking: Reported on 09/29/2021)    fluticasone (FLONASE) 50 MCG/ACT nasal spray Place 1-2 sprays into both nostrils daily. 09/29/2021: Uses PRN   glipiZIDE (GLUCOTROL XL) 5 MG 24 hr tablet TAKE 1 TABLET BY MOUTH TWICE A DAY    glucose blood  test strip Use as instructed to check sugar 2 times daily    hydrALAZINE (APRESOLINE) 50 MG tablet Take 1 tablet (50 mg total) by mouth 3 (three) times daily. 09/29/2021: Patient taking differently: takes 50 mg once daily. He reports not being of prescription directions.     hydrochlorothiazide (HYDRODIURIL) 25 MG tablet TAKE 1 TABLET BY MOUTH EVERY DAY    loperamide (IMODIUM) 2 MG capsule Take 1 capsule (2 mg total) by mouth 2 (two) times daily as needed for diarrhea or loose stools.    losartan (COZAAR) 100 MG tablet TAKE 1 TABLET BY MOUTH EVERY DAY    metFORMIN (GLUCOPHAGE) 500 MG tablet Take 2 tablets (1,000 mg total) by mouth 2 (two) times daily with a meal.    metoprolol tartrate (LOPRESSOR) 50 MG tablet TAKE 1 TABLET BY MOUTH TWICE A DAY    nitroGLYCERIN (NITROSTAT) 0.4 MG SL tablet PLACE 1 TABLET UNDER THE TONGUE EVERY 5 MIN X 3 DOSES AS NEEDED FOR CHEST PAIN    ondansetron (ZOFRAN-ODT) 8 MG disintegrating tablet Take 1 tablet (8 mg total) by mouth every 8 (eight) hours as needed for nausea or vomiting.    pravastatin (PRAVACHOL) 40 MG tablet Take 80 mg by mouth daily. (Patient not taking: Reported on 09/29/2021)    Semaglutide,0.25 or 0.'5MG'$ /DOS, 2 MG/3ML SOPN Inject 0.5 mg into the skin once a week.    sertraline (ZOLOFT) 25 MG tablet TAKE 1 TABLET (25 MG TOTAL) BY MOUTH DAILY.    sertraline (ZOLOFT) 50 MG tablet Take 1 tablet (50 mg total) by mouth daily. (Patient not taking: Reported on 09/29/2021)    sildenafil (REVATIO) 20 MG tablet Take 3-5 tablets 1hr prior to intercourse    tiZANidine (ZANAFLEX) 2 MG tablet Take 1 tablet (2 mg total) by mouth every 8 (eight) hours as needed for muscle spasms.    No facility-administered encounter medications on file as of 10/05/2021.   [place] for General Review Call     Chart Review:   Have there been any documented new, changed, or discontinued medications since last visit? No (If yes, include name, dose, frequency, date) Has there been any documented recent hospitalizations or ED visits since last visit with Clinical Pharmacist? No Brief Summary (including medication and/or Diagnosis changes):     Adherence Review:   Does the Clinical Pharmacist Assistant have access to adherence rates?  Yes Adherence rates for STAR metric medications (List medication(s)/day supply/ last 2 fill dates). Adherence rates for medications indicated for disease state being reviewed (List medication(s)/day supply/ last 2 fill dates). Does the patient have >5 day gap between last estimated fill dates for any of the above medications or other medication gaps? No Reason for medication gaps.     Disease State Questions:   Able to connect with Patient? Yes Did patient have any problems with their health recently? Patient recently had a quad injury from a fall and scheduled for knee surgery on 10/08/21 Note problems and Concerns: Have you had any admissions or emergency room visits or worsening of your condition(s) since last visit? See above Details of ED visit, hospital visit and/or worsening condition(s): Have you had any visits with new specialists or providers since your last visit? Orthopedic Explain: Have you had any new health care problem(s) since your last visit?  See above New problem(s) reported: Have you run out of any of your medications since you last spoke with clinical pharmacist? No What caused you to run out of your medications?  Are there any medications you are not taking as prescribed?  What kept you from taking your medications as prescribed? No Are you having any issues or side effects with your medications? No Note of issues or side effects: Do you have any other health concerns or questions you want to discuss with your Clinical Pharmacist before your next visit? None other than discussed above Note additional concerns and questions from Patient. Are there any health concerns that you feel we can do a better job addressing? No Note Patient's response. Are you having any problems with any of the following since the last visit: (select all that apply) He is having some issues with mobility due to his knee injury.                Details: 12. Any falls since last visit? Yes was  seen 09/20/21             Details: 13. Any increased or uncontrolled pain since last visit? No             Details: 14. Next visit Type: Post Op Visit        Visit with: Leandrew Koyanagi, MD        Date: 10/15/21        Time: 10:45am   15. Additional Details?       Patient is scheduled for knee surgery on 10/08/21.        Care Gaps   AWV: 02/02/21 Colonoscopy:  due 08/01/24 DM Eye Exam: due 04/02/21 DM Foot Exam: unknown  Microalbumin: overdue  HbgAIC: done 08/06/21 (7.0) DEXA: never  Mammogram: N/A      Star Rating Drugs: pravastatin (PRAVACHOL) 40 MG tablet - last filled 02/04/21 90 days  metFORMIN (GLUCOPHAGE) 500 MG tablet - last filled 02/14/21 90 days  losartan (COZAAR) 100 MG tablet - last filled 09/15/21 90 days  glipiZIDE (GLUCOTROL XL) 5 MG 24 hr tablet - last filled 09/15/21 90 days   Future Appointments  Date Time Provider [place]  10/12/2021 10:00 AM [facility] None  10/15/2021 10:45 AM Leandrew Koyanagi, MD [facility] None  12/09/2021  1:40 PM Wendie Agreste, MD [facility]  12/24/2021  2:20 PM Ronald Kingdom, MD [facility] None  04/14/2022 10:00 AM Ronald Espy, MD [place], [facility] Clinical Pharmacist Assistant  [phone]

## 2021-10-07 ENCOUNTER — Other Ambulatory Visit: Payer: Self-pay | Admitting: Physician Assistant

## 2021-10-07 MED ORDER — OXYCODONE-ACETAMINOPHEN 5-325 MG PO TABS: 1.0000 | ORAL_TABLET | Freq: Four times a day (QID) | ORAL | 0 refills | Status: DC | PRN

## 2021-10-07 MED ORDER — ONDANSETRON HCL 4 MG PO TABS: 4.0000 mg | ORAL_TABLET | Freq: Three times a day (TID) | ORAL | 0 refills | Status: DC | PRN

## 2021-10-07 MED ORDER — METHOCARBAMOL 750 MG PO TABS: 750.0000 mg | ORAL_TABLET | Freq: Two times a day (BID) | ORAL | 2 refills | Status: DC | PRN

## 2021-10-08 DIAGNOSIS — Y999 Unspecified external cause status: Secondary | ICD-10-CM | POA: Diagnosis not present

## 2021-10-08 DIAGNOSIS — X58XXXA Exposure to other specified factors, initial encounter: Secondary | ICD-10-CM | POA: Diagnosis not present

## 2021-10-08 DIAGNOSIS — S76112A Strain of left quadriceps muscle, fascia and tendon, initial encounter: Secondary | ICD-10-CM | POA: Diagnosis not present

## 2021-10-08 DIAGNOSIS — S76112S Strain of left quadriceps muscle, fascia and tendon, sequela: Secondary | ICD-10-CM | POA: Diagnosis not present

## 2021-10-12 ENCOUNTER — Ambulatory Visit: Payer: Federal, State, Local not specified - PPO

## 2021-10-12 ENCOUNTER — Ambulatory Visit (INDEPENDENT_AMBULATORY_CARE_PROVIDER_SITE_OTHER): Payer: Federal, State, Local not specified - PPO | Admitting: Physician Assistant

## 2021-10-12 ENCOUNTER — Ambulatory Visit (INDEPENDENT_AMBULATORY_CARE_PROVIDER_SITE_OTHER): Payer: Federal, State, Local not specified - PPO | Admitting: Orthopedic Surgery

## 2021-10-12 DIAGNOSIS — S76112A Strain of left quadriceps muscle, fascia and tendon, initial encounter: Secondary | ICD-10-CM

## 2021-10-12 DIAGNOSIS — Z8546 Personal history of malignant neoplasm of prostate: Secondary | ICD-10-CM | POA: Diagnosis not present

## 2021-10-12 DIAGNOSIS — R350 Frequency of micturition: Secondary | ICD-10-CM | POA: Diagnosis not present

## 2021-10-12 DIAGNOSIS — C61 Malignant neoplasm of prostate: Secondary | ICD-10-CM

## 2021-10-12 LAB — BLADDER SCAN AMB NON-IMAGING

## 2021-10-12 MED ORDER — MIRABEGRON ER 50 MG PO TB24: 50.0000 mg | ORAL_TABLET | Freq: Every day | ORAL | 0 refills | Status: DC

## 2021-10-12 NOTE — Patient Instructions (Signed)
Start Myrbetriq once daily for the next 4 weeks to help with your urinary urgency, frequency, and leakage. I'll see you back in clinic in 1 month to check your symptoms on this medication.  Please also go home and size yourself for condom catheters, then call us back in clinic to let us know what size you need so I can order them for you. I will need to know both the size and length needed so I can order you the right thing.  Common foods that can worsen bladder pain, pain with urination, or urinary urgency/frequency include:  Caffeine Alcohol Carbonated beverages Chocolate Spicy foods Acidic foods including citrus and tomatoes

## 2021-10-12 NOTE — Progress Notes (Unsigned)
10/12/2021 12:56 PM   Pennelope Brock [birth year] [id number]  CC: Chief Complaint  Patient presents with   Prostate Cancer    HPI: [name] is a 71 y.o. male with PMH prostate cancer s/p IMRT, ED, and urinary frequency who originally presented for lab visit for 58-monthPSA but who was added onto my schedule with reports of possible UTI.  He is accompanied today by his wife, who contributes to HPI.  Today he reports a month-long history of increased urinary urgency, frequency, and urge incontinence.  He has been purchasing condom catheters online and using these for management of his symptoms.  He notes that his symptoms were particularly bothersome last week and he was concerned for possible UTI.  He denies dysuria.  He does report worsened urinary symptoms after he drinks soft drinks or teas.  In-office UA and microscopy today pan negative. PVR 04m  PMH: Past Medical History:  Diagnosis Date   Adenomatous colon polyp 2011   Anemia    [facility] p[facility]   med rx for 95% OM (u/a to access due to tortuosity), 95% D1 and other moderate CAD at cath   Arthritis    "back, knees" (05/01/2015)   Chest pain    pleuritic   Chronic lower back pain    Complex tear of medial meniscus of left knee 09/14/2018   Coronary artery disease    Depression    GERD (gastroesophageal reflux disease)    HTN (hypertension)    Hyperlipidemia    Obstructive sleep apnea    noncompliant with CPAP   Osteoarthritis    Pneumonia ~ 2014 X 1   [place]state cancer ([facility]   Refusal of blood transfusions as patient is Jehovah's Witness    Rotator cuff arthropathy, right    Rupture of right supraspinatus tendon 01/18/2019   Type II or unspecified type diabetes mellitus without mention of complication, not stated as uncontrolled     Surgical History: Past Surgical History:  Procedure Laterality Date   CARDIAC CATHETERIZATION  2004   patent coronary arteries   CARDIAC CATHETERIZATION   05/01/2015   "tried to put stent in but couldn't"   CARDIAC CATHETERIZATION N/A 05/01/2015   Procedure: Left Heart Cath and Coronary Angiography;  Surgeon: HeBelva CromeMD; LAD 60%, oD1 90%, pD1 70%, D2 70%, CFX patent stent, 30% distal to prev stent, pRCA 20%, OM1 90/95%, NL LV   CARDIAC CATHETERIZATION N/A 05/01/2015   Procedure: Coronary Stent Intervention;  Surgeon: HeBelva CromeMD;  Unsuccessful PCI OM due to tortuosity   CARDIAC CATHETERIZATION N/A 11/06/2014   Procedure: Left Heart Cath and Coronary Angiography;  Surgeon: HeBelva CromeMD;  [facility];  Service: Cardiovascular;  Laterality: N/A;   CLOSED REDUCTION SHOULDER DISLOCATION Right ~ 1975   "& reattached muscle"   COLONOSCOPY W/ BIOPSIES AND POLYPECTOMY  X 2   ESOPHAGOGASTRODUODENOSCOPY ENDOSCOPY     KNEE ARTHROSCOPY WITH MEDIAL MENISECTOMY Left 09/14/2018   Procedure: LEFT KNEE ARTHROSCOPY CHONDROPLASY,  WITH MEDIAL MENISECTOMY;  Surgeon: LaMarchia BondMD;  [facility] Service: Orthopedics;  Laterality: Left;   PILONIDAL CYST EXCISION     SHOULDER ARTHROSCOPY WITH DEBRIDEMENT AND BICEP TENDON REPAIR Right 01/18/2019   Procedure: RIGHT SHOULDER ARTHROSCOPY WITH DEBRIDEMENT AND DECOMPRSSION SUBACROMIAL PARTIAL ACROMIOPLASTY;  Surgeon: LaMarchia BondMD;  [place] Service: Orthopedics;  Laterality: Right;   SHOULDER ARTHROSCOPY WITH ROTATOR CUFF REPAIR Right 01/18/2019  Procedure: SHOULDER ARTHROSCOPY WITH ROTATOR CUFF REPAIR;  Surgeon: Marchia Bond, MD;  Location: [place];  Service: Orthopedics;  Laterality: Right;    Home Medications:  Allergies as of 10/12/2021       Reactions   Statins Other (See Comments)   REACTION: joint pain Lipitor- headaches Has also tried Livalo, pravachol, zetia, crestor, welchol   Imdur [isosorbide Dinitrate]    Headache - "violent"   Repatha [evolocumab]    Muscle weakness and soreness         Medication List        Accurate as of October 12, 2021 12:56 PM. If you have any questions, ask your nurse or doctor.          acetaminophen 325 MG tablet Commonly known as: Tylenol Take 2 tablets (650 mg total) by mouth every 6 (six) hours as needed for moderate pain.   albuterol 108 (90 Base) MCG/ACT inhaler Commonly known as: VENTOLIN HFA INHALE 1-2 PUFFS INTO THE LUNGS EVERY 4 HOURS AS NEEDED FOR WHEEZING OR SHORTNESS OF BREATH.   aspirin EC 81 MG tablet Take 81 mg by mouth daily. Swallow whole.   cetirizine 10 MG tablet Commonly known as: ZyrTEC Allergy Take 1 tablet (10 mg total) by mouth daily.   fluticasone 50 MCG/ACT nasal spray Commonly known as: FLONASE Place 1-2 sprays into both nostrils daily.   glipiZIDE 5 MG 24 hr tablet Commonly known as: GLUCOTROL XL TAKE 1 TABLET BY MOUTH TWICE A DAY   glucose blood test strip Use as instructed to check sugar 2 times daily   hydrALAZINE 50 MG tablet Commonly known as: APRESOLINE Take 1 tablet (50 mg total) by mouth 3 (three) times daily.   hydrochlorothiazide 25 MG tablet Commonly known as: HYDRODIURIL TAKE 1 TABLET BY MOUTH EVERY DAY   loperamide 2 MG capsule Commonly known as: IMODIUM Take 1 capsule (2 mg total) by mouth 2 (two) times daily as needed for diarrhea or loose stools.   losartan 100 MG tablet Commonly known as: COZAAR TAKE 1 TABLET BY MOUTH EVERY DAY   metFORMIN 500 MG tablet Commonly known as: GLUCOPHAGE Take 2 tablets (1,000 mg total) by mouth 2 (two) times daily with a meal.   methocarbamol 750 MG tablet Commonly known as: Robaxin-750 Take 1 tablet (750 mg total) by mouth 2 (two) times daily as needed for muscle spasms.   metoprolol tartrate 50 MG tablet Commonly known as: LOPRESSOR TAKE 1 TABLET BY MOUTH TWICE A DAY   mirabegron ER 50 MG Tb24 tablet Commonly known as: MYRBETRIQ Take 1 tablet (50 mg total) by mouth daily.   Moderna COVID-19 Bival Booster 50 MCG/0.5ML  injection Generic drug: COVID-19 mRNA bivalent vaccine (Moderna) Inject into the muscle.   nitroGLYCERIN 0.4 MG SL tablet Commonly known as: NITROSTAT PLACE 1 TABLET UNDER THE TONGUE EVERY 5 MIN X 3 DOSES AS NEEDED FOR CHEST PAIN   ondansetron 4 MG tablet Commonly known as: Zofran Take 1 tablet (4 mg total) by mouth every 8 (eight) hours as needed for nausea or vomiting.   ondansetron 8 MG disintegrating tablet Commonly known as: ZOFRAN-ODT Take 1 tablet (8 mg total) by mouth every 8 (eight) hours as needed for nausea or vomiting.   oxyCODONE-acetaminophen 5-325 MG tablet Commonly known as: Percocet Take 1-2 tablets by mouth every 6 (six) hours as needed.   pravastatin 40 MG tablet Commonly known as: PRAVACHOL Take 80 mg by mouth daily.   [place] 1  Device by Does not apply route daily.   Semaglutide(0.25 or 0.'5MG'$ /DOS) 2 MG/3ML Sopn Inject 0.5 mg into the skin once a week.   sertraline 50 MG tablet Commonly known as: Zoloft Take 1 tablet (50 mg total) by mouth daily.   sertraline 25 MG tablet Commonly known as: ZOLOFT TAKE 1 TABLET (25 MG TOTAL) BY MOUTH DAILY.   sildenafil 20 MG tablet Commonly known as: REVATIO Take 3-5 tablets 1hr prior to intercourse   tiZANidine 2 MG tablet Commonly known as: ZANAFLEX Take 1 tablet (2 mg total) by mouth every 8 (eight) hours as needed for muscle spasms.        Allergies:  Allergies  Allergen Reactions   Statins Other (See Comments)    REACTION: joint pain Lipitor- headaches Has also tried Livalo, pravachol, zetia, crestor, welchol   Imdur [Isosorbide Dinitrate]     Headache - "violent"   Repatha [Evolocumab]     Muscle weakness and soreness    Family History: Family History  Problem Relation Age of Onset   Coronary artery disease Other        CABG   Coronary artery disease Mother    Stroke Mother    Heart disease Mother    Diabetes Mother    Other Father    Diabetes Sister     Diabetes Maternal Uncle        x2   Alcohol abuse Other    Diabetes Paternal Uncle        x2   Colon cancer Paternal Uncle    Colon polyps Neg Hx    Esophageal cancer Neg Hx     Social History:   reports that he quit smoking about 38 years ago. His smoking use included cigarettes. He has a 5.00 pack-year smoking history. He has never used smokeless tobacco. He reports current alcohol use. He reports that he does not use drugs.  Physical Exam: There were no vitals taken for this visit.  Constitutional:  Alert and oriented, no acute distress, nontoxic appearing HEENT: [place], AT Cardiovascular: No clubbing, cyanosis, or edema Respiratory: Normal respiratory effort, no increased work of breathing Skin: No rashes, bruises or suspicious lesions Neurologic: Grossly intact, no focal deficits, moving all 4 extremities Psychiatric: Normal mood and affect  Laboratory Data: Results for orders placed or performed in visit on 10/12/21  Microscopic Examination   Urine  Result Value Ref Range   WBC, UA None seen 0 - 5 /hpf   RBC, Urine None seen 0 - 2 /hpf   Epithelial Cells (non renal) 0-10 0 - 10 /hpf   Bacteria, UA Few (A) None seen/Few  Urinalysis, Complete  Result Value Ref Range   Specific Gravity, UA 1.020 1.005 - 1.030   pH, UA 5.0 5.0 - 7.5   Color, UA Yellow Yellow   Appearance Ur Clear Clear   Leukocytes,UA Negative Negative   Protein,UA Negative Negative/Trace   Glucose, UA Negative Negative   Ketones, UA Negative Negative   RBC, UA Negative Negative   Bilirubin, UA Negative Negative   Urobilinogen, Ur 0.2 0.2 - 1.0 mg/dL   Nitrite, UA Negative Negative   Microscopic Examination See below:   PSA  Result Value Ref Range   Prostate Specific Ag, Serum 0.9 0.0 - 4.0 ng/mL  BLADDER SCAN AMB NON-IMAGING  Result Value Ref Range   Scan Result 66m    Assessment & Plan:   1. Urinary frequency Worsening urgency, frequency, urge incontinence without dysuria or gross hematuria  times  several months.  He has been managing this on his own with condom catheters.  I offered him a trial of pharmacotherapy and he accepted.  It looks like he has been on Myrbetriq 25 mg in the past, will increase dose to 50 mg and plan for symptom recheck and PVR in 4 weeks.  He is still interested in using condom catheters for any breakthrough urgency/leakage.  I gave him samples today, counseled him on how to size himself at home.  And asked him to contact our clinic with his needed size so that we may order these for him.  We discussed that condom catheter should be changed daily. - Urinalysis, Complete - BLADDER SCAN AMB NON-IMAGING - mirabegron ER (MYRBETRIQ) 50 MG TB24 tablet; Take 1 tablet (50 mg total) by mouth daily.  Dispense: 28 tablet; Refill: 0  2. Malignant neoplasm of prostate ([place]) Will obtain PSA as planned today in the absence of evidence of urinary infection. - PSA   Return in about 4 weeks (around 11/09/2021) for Symptom recheck with PVR.  Debroah Loop, PA-C  [facility] [address] [place], [place] [address] [phone]

## 2021-10-13 ENCOUNTER — Encounter: Payer: Self-pay | Admitting: Orthopedic Surgery

## 2021-10-13 LAB — URINALYSIS, COMPLETE
Bilirubin, UA: NEGATIVE
Glucose, UA: NEGATIVE
Ketones, UA: NEGATIVE
Leukocytes,UA: NEGATIVE
Nitrite, UA: NEGATIVE
Protein,UA: NEGATIVE
RBC, UA: NEGATIVE
Specific Gravity, UA: 1.02 (ref 1.005–1.030)
Urobilinogen, Ur: 0.2 mg/dL (ref 0.2–1.0)
pH, UA: 5 (ref 5.0–7.5)

## 2021-10-13 LAB — MICROSCOPIC EXAMINATION
RBC, Urine: NONE SEEN /hpf (ref 0–2)
WBC, UA: NONE SEEN /hpf (ref 0–5)

## 2021-10-13 LAB — PSA: Prostate Specific Ag, Serum: 0.9 ng/mL (ref 0.0–4.0)

## 2021-10-13 NOTE — Progress Notes (Signed)
Post-Op Visit Note   Patient: [name]           Date of Birth: 1950-07-31           MRN: [id number] Visit Date: 10/12/2021 PCP: Wendie Agreste, MD   Assessment & Plan:  Chief Complaint:  Chief Complaint  Patient presents with   Left Leg - Wound Check   Visit Diagnoses:  1. Traumatic rupture of left quadriceps tendon, initial encounter     Plan: [name] is a 71 year old patient with left quad rupture repaired by Dr. Erlinda Hong.  Concerned about incision is present.  On examination he has excellent appearing incision with no drainage erythema or fluctuance.  No calf tenderness with negative Homans today.  When he tightens the quad the patella moves appropriately.  Impression is normal-appearing incision following quad repair surgery.  Incision is cleaned with alcohol today and new dressing applied.  Follow-up with Dr. Erlinda Hong later this week.  Follow-Up Instructions: No follow-ups on file.   Orders:  No orders of the defined types were placed in this encounter.  No orders of the defined types were placed in this encounter.   Imaging: No results found.  PMFS History: Patient Active Problem List   Diagnosis Date Noted   Traumatic rupture of left quadriceps tendon 09/30/2021   Ceruminosis, bilateral 04/01/2020   Acute non-ST elevation myocardial infarction (NSTEMI) ([place]) 05/04/2019   Statin myopathy 04/04/2019   Statin intolerance -weakness, stiffness 02/13/2019   Rupture of right supraspinatus tendon 01/18/2019   Complex tear of medial meniscus of left knee 09/14/2018   Malignant neoplasm of prostate ([place]) 01/04/2018   Refusal of blood transfusions as patient is Jehovah's Witness    Pneumonia    Osteoarthritis    Obstructive sleep apnea    Hyperlipidemia    HTN (hypertension)    GERD (gastroesophageal reflux disease)    Depression    Coronary artery disease    Chronic lower back pain    Chest pain    Arthritis    Anemia    Benign essential HTN 01/03/2017   PAT  (paroxysmal atrial tachycardia) ([place]) 10/27/2015   Type 2 diabetes mellitus with circulatory disorder, without long-term current use of insulin ([place]) 06/19/2015   Angina pectoris ([place])    Elevated PSA 12/20/2014   CAD (coronary artery disease)    Obstructive sleep apnea 09/06/2014   Personal history of colonic polyps 12/13/2013   Blood in stool 12/13/2013   Prostatitis, acute 12/13/2013   Hematochezia 12/13/2013   Insomnia 04/19/2013   Obesity with body mass index of 30.0-39.9 10/18/2012   Right lumbar radiculitis 09/29/2011   Adenomatous colon polyp 04/05/2009   ORGANIC IMPOTENCE 11/11/2008   Hyperlipidemia LDL goal <70 08/23/2008   GERD 08/23/2008   BPH (benign prostatic hypertrophy) with urinary obstruction 08/23/2008   OSTEOARTHRITIS 08/23/2008   Past Medical History:  Diagnosis Date   Adenomatous colon polyp 2011   Anemia    Angina pectoris ([place]) 05/01/2015   med rx for 95% OM (u/a to access due to tortuosity), 95% D1 and other moderate CAD at cath   Arthritis    "back, knees" (05/01/2015)   Chest pain    pleuritic   Chronic lower back pain    Complex tear of medial meniscus of left knee 09/14/2018   Coronary artery disease    Depression    GERD (gastroesophageal reflux disease)    HTN (hypertension)    Hyperlipidemia    Obstructive sleep apnea  noncompliant with CPAP   Osteoarthritis    Pneumonia ~ 2014 X 1   Prostate cancer (HCC)    Refusal of blood transfusions as patient is Jehovah's Witness    Rotator cuff arthropathy, right    Rupture of right supraspinatus tendon 01/18/2019   Type II or unspecified type diabetes mellitus without mention of complication, not stated as uncontrolled     Family History  Problem Relation Age of Onset   Coronary artery disease Other        CABG   Coronary artery disease Mother    Stroke Mother    Heart disease Mother    Diabetes Mother    Other Father    Diabetes Sister    Diabetes Maternal Uncle        x2   Alcohol  abuse Other    Diabetes Paternal Uncle        x2   Colon cancer Paternal Uncle    Colon polyps Neg Hx    Esophageal cancer Neg Hx     Past Surgical History:  Procedure Laterality Date   CARDIAC CATHETERIZATION  2004   patent coronary arteries   CARDIAC CATHETERIZATION  05/01/2015   "tried to put stent in but couldn't"   CARDIAC CATHETERIZATION N/A 05/01/2015   Procedure: Left Heart Cath and Coronary Angiography;  Surgeon: Belva Crome, MD; LAD 60%, oD1 90%, pD1 70%, D2 70%, CFX patent stent, 30% distal to prev stent, pRCA 20%, OM1 90/95%, NL LV   CARDIAC CATHETERIZATION N/A 05/01/2015   Procedure: Coronary Stent Intervention;  Surgeon: Belva Crome, MD;  Unsuccessful PCI OM due to tortuosity   CARDIAC CATHETERIZATION N/A 11/06/2014   Procedure: Left Heart Cath and Coronary Angiography;  Surgeon: Belva Crome, MD;  Location: [facility];  Service: Cardiovascular;  Laterality: N/A;   CLOSED REDUCTION SHOULDER DISLOCATION Right ~ 1975   "& reattached muscle"   COLONOSCOPY W/ BIOPSIES AND POLYPECTOMY  X 2   ESOPHAGOGASTRODUODENOSCOPY ENDOSCOPY     KNEE ARTHROSCOPY WITH MEDIAL MENISECTOMY Left 09/14/2018   Procedure: LEFT KNEE ARTHROSCOPY CHONDROPLASY,  WITH MEDIAL MENISECTOMY;  Surgeon: Marchia Bond, MD;  Location: [facility];  Service: Orthopedics;  Laterality: Left;   PILONIDAL CYST EXCISION     SHOULDER ARTHROSCOPY WITH DEBRIDEMENT AND BICEP TENDON REPAIR Right 01/18/2019   Procedure: RIGHT SHOULDER ARTHROSCOPY WITH DEBRIDEMENT AND DECOMPRSSION SUBACROMIAL PARTIAL ACROMIOPLASTY;  Surgeon: Marchia Bond, MD;  Location: [facility];  Service: Orthopedics;  Laterality: Right;   SHOULDER ARTHROSCOPY WITH ROTATOR CUFF REPAIR Right 01/18/2019   Procedure: SHOULDER ARTHROSCOPY WITH ROTATOR CUFF REPAIR;  Surgeon: Marchia Bond, MD;  Location: [facility];  Service: Orthopedics;  Laterality: Right;   Social History   Occupational History    Occupation: Retired    Fish farm manager: UNEMPLOYED  Tobacco Use   Smoking status: Former    Packs/day: 0.50    Years: 10.00    Total pack years: 5.00    Types: Cigarettes    Quit date: 04/06/1983    Years since quitting: 38.5   Smokeless tobacco: Never  Vaping Use   Vaping Use: Never used  Substance and Sexual Activity   Alcohol use: Yes    Alcohol/week: 0.0 standard drinks of alcohol    Comment: `/26/2017 "might drink a beer q couple months mostly; summertime I might drink 2-3 beers/week"   Drug use: No   Sexual activity: Not Currently

## 2021-10-15 ENCOUNTER — Encounter: Payer: Self-pay | Admitting: Orthopaedic Surgery

## 2021-10-15 ENCOUNTER — Ambulatory Visit (INDEPENDENT_AMBULATORY_CARE_PROVIDER_SITE_OTHER): Payer: Federal, State, Local not specified - PPO | Admitting: Orthopaedic Surgery

## 2021-10-15 DIAGNOSIS — S76112A Strain of left quadriceps muscle, fascia and tendon, initial encounter: Secondary | ICD-10-CM

## 2021-10-15 MED ORDER — OXYCODONE HCL 5 MG PO TABS: 5.0000 mg | ORAL_TABLET | Freq: Two times a day (BID) | ORAL | 0 refills | Status: DC | PRN

## 2021-10-15 NOTE — Progress Notes (Signed)
Post-Op Visit Note   Patient: [name]           Date of Birth: April 08, 1950           MRN: [id number] Visit Date: 10/15/2021 PCP: Wendie Agreste, MD   Assessment & Plan:  Chief Complaint:  Chief Complaint  Patient presents with   Left Knee - Follow-up    Quadriceps repair 10/08/2021   Visit Diagnoses:  1. Traumatic rupture of left quadriceps tendon, initial encounter     Plan: Robertlee is 1 week status post left quadriceps repair on 10/08/2021.  Doing well overall.  Has been somewhat compliant with wearing the [place] brace.  Feels like it is too tight.  Examination of the left knee shows an intact surgical incision without any drainage or signs of infection.  We provided [facility] with a new brace today that is better fitting.  Range of motion 0 to 30 degrees.  Strongly urged compliance with the brace.  Recheck next week for suture removal.  Follow-Up Instructions: No follow-ups on file.   Orders:  No orders of the defined types were placed in this encounter.  No orders of the defined types were placed in this encounter.   Imaging: No results found.  PMFS History: Patient Active Problem List   Diagnosis Date Noted   Traumatic rupture of left quadriceps tendon 09/30/2021   Ceruminosis, bilateral 04/01/2020   Acute non-ST elevation myocardial infarction (NSTEMI) ([place]) 05/04/2019   Statin myopathy 04/04/2019   Statin intolerance -weakness, stiffness 02/13/2019   Rupture of right supraspinatus tendon 01/18/2019   Complex tear of medial meniscus of left knee 09/14/2018   Malignant neoplasm of prostate ([place]) 01/04/2018   Refusal of blood transfusions as patient is Jehovah's Witness    Pneumonia    Osteoarthritis    Obstructive sleep apnea    Hyperlipidemia    HTN (hypertension)    GERD (gastroesophageal reflux disease)    Depression    Coronary artery disease    Chronic lower back pain    Chest pain    Arthritis    Anemia    Benign essential HTN 01/03/2017    PAT (paroxysmal atrial tachycardia) ([place]) 10/27/2015   Type 2 diabetes mellitus with circulatory disorder, without long-term current use of insulin ([place]) 06/19/2015   Angina pectoris ([place])    Elevated PSA 12/20/2014   CAD (coronary artery disease)    Obstructive sleep apnea 09/06/2014   Personal history of colonic polyps 12/13/2013   Blood in stool 12/13/2013   Prostatitis, acute 12/13/2013   Hematochezia 12/13/2013   Insomnia 04/19/2013   Obesity with body mass index of 30.0-39.9 10/18/2012   Right lumbar radiculitis 09/29/2011   Adenomatous colon polyp 04/05/2009   ORGANIC IMPOTENCE 11/11/2008   Hyperlipidemia LDL goal <70 08/23/2008   GERD 08/23/2008   BPH (benign prostatic hypertrophy) with urinary obstruction 08/23/2008   OSTEOARTHRITIS 08/23/2008   Past Medical History:  Diagnosis Date   Adenomatous colon polyp 2011   Anemia    Angina pectoris ([place]) 05/01/2015   med rx for 95% OM (u/a to access due to tortuosity), 95% D1 and other moderate CAD at cath   Arthritis    "back, knees" (05/01/2015)   Chest pain    pleuritic   Chronic lower back pain    Complex tear of medial meniscus of left knee 09/14/2018   Coronary artery disease    Depression    GERD (gastroesophageal reflux disease)    HTN (hypertension)  Hyperlipidemia    Obstructive sleep apnea    noncompliant with CPAP   Osteoarthritis    Pneumonia ~ 2014 X 1   Prostate cancer (HCC)    Refusal of blood transfusions as patient is Jehovah's Witness    Rotator cuff arthropathy, right    Rupture of right supraspinatus tendon 01/18/2019   Type II or unspecified type diabetes mellitus without mention of complication, not stated as uncontrolled     Family History  Problem Relation Age of Onset   Coronary artery disease Other        CABG   Coronary artery disease Mother    Stroke Mother    Heart disease Mother    Diabetes Mother    Other Father    Diabetes Sister    Diabetes Maternal Uncle        x2    Alcohol abuse Other    Diabetes Paternal Uncle        x2   Colon cancer Paternal Uncle    Colon polyps Neg Hx    Esophageal cancer Neg Hx     Past Surgical History:  Procedure Laterality Date   CARDIAC CATHETERIZATION  2004   patent coronary arteries   CARDIAC CATHETERIZATION  05/01/2015   "tried to put stent in but couldn't"   CARDIAC CATHETERIZATION N/A 05/01/2015   Procedure: Left Heart Cath and Coronary Angiography;  Surgeon: Belva Crome, MD; LAD 60%, oD1 90%, pD1 70%, D2 70%, CFX patent stent, 30% distal to prev stent, pRCA 20%, OM1 90/95%, NL LV   CARDIAC CATHETERIZATION N/A 05/01/2015   Procedure: Coronary Stent Intervention;  Surgeon: Belva Crome, MD;  Unsuccessful PCI OM due to tortuosity   CARDIAC CATHETERIZATION N/A 11/06/2014   Procedure: Left Heart Cath and Coronary Angiography;  Surgeon: Belva Crome, MD;  Location: [facility];  Service: Cardiovascular;  Laterality: N/A;   CLOSED REDUCTION SHOULDER DISLOCATION Right ~ 1975   "& reattached muscle"   COLONOSCOPY W/ BIOPSIES AND POLYPECTOMY  X 2   ESOPHAGOGASTRODUODENOSCOPY ENDOSCOPY     KNEE ARTHROSCOPY WITH MEDIAL MENISECTOMY Left 09/14/2018   Procedure: LEFT KNEE ARTHROSCOPY CHONDROPLASY,  WITH MEDIAL MENISECTOMY;  Surgeon: Marchia Bond, MD;  Location: [facility];  Service: Orthopedics;  Laterality: Left;   PILONIDAL CYST EXCISION     SHOULDER ARTHROSCOPY WITH DEBRIDEMENT AND BICEP TENDON REPAIR Right 01/18/2019   Procedure: RIGHT SHOULDER ARTHROSCOPY WITH DEBRIDEMENT AND DECOMPRSSION SUBACROMIAL PARTIAL ACROMIOPLASTY;  Surgeon: Marchia Bond, MD;  Location: [facility];  Service: Orthopedics;  Laterality: Right;   SHOULDER ARTHROSCOPY WITH ROTATOR CUFF REPAIR Right 01/18/2019   Procedure: SHOULDER ARTHROSCOPY WITH ROTATOR CUFF REPAIR;  Surgeon: Marchia Bond, MD;  Location: [facility];  Service: Orthopedics;  Laterality: Right;   Social History   Occupational  History   Occupation: Retired    Fish farm manager: UNEMPLOYED  Tobacco Use   Smoking status: Former    Packs/day: 0.50    Years: 10.00    Total pack years: 5.00    Types: Cigarettes    Quit date: 04/06/1983    Years since quitting: 38.5   Smokeless tobacco: Never  Vaping Use   Vaping Use: Never used  Substance and Sexual Activity   Alcohol use: Yes    Alcohol/week: 0.0 standard drinks of alcohol    Comment: `/26/2017 "might drink a beer q couple months mostly; summertime I might drink 2-3 beers/week"   Drug use: No   Sexual activity: Not Currently

## 2021-10-16 ENCOUNTER — Telehealth: Payer: Self-pay | Admitting: *Deleted

## 2021-10-16 NOTE — Telephone Encounter (Signed)
Called pt to get more clairfication on mychart message sent to scheduling. Pt wanted to get his urine in the bottom of the container tested for semen. Advised pt that container had days of urine in it and it was sediment in the bottom. Urine was checked and no semen found.

## 2021-10-16 NOTE — Telephone Encounter (Signed)
-----   Message -----  From: [name]  Sent: 10/16/2021   7:48 AM EDT  To: Gerhard Perches  Subject: Appointment Scheduled                            I want to make clear that the container left there by my wife was to go back with me on my last visit. she was to have a sample taken from it. the rest was to be tested by my primary care doctor or urgent care visit. she thought it was asked to be thrown out rather than tested by your staff. it appeared to me to  be semen concentrated from by many bathroom visits I collected in this container. I stressed to that I never see any during ejaculation in months to the staff.

## 2021-10-22 ENCOUNTER — Ambulatory Visit: Payer: Federal, State, Local not specified - PPO

## 2021-10-23 ENCOUNTER — Telehealth: Payer: Self-pay | Admitting: Physician Assistant

## 2021-10-23 NOTE — Telephone Encounter (Signed)
Pt LMOM about condom catheters.  He said he will need a 86m standard.

## 2021-10-23 NOTE — Telephone Encounter (Signed)
Order placed. See media.

## 2021-10-26 DIAGNOSIS — G4733 Obstructive sleep apnea (adult) (pediatric): Secondary | ICD-10-CM | POA: Diagnosis not present

## 2021-11-03 ENCOUNTER — Other Ambulatory Visit: Payer: Self-pay | Admitting: Physician Assistant

## 2021-11-03 ENCOUNTER — Ambulatory Visit (INDEPENDENT_AMBULATORY_CARE_PROVIDER_SITE_OTHER): Payer: Federal, State, Local not specified - PPO | Admitting: Physician Assistant

## 2021-11-03 DIAGNOSIS — S76112D Strain of left quadriceps muscle, fascia and tendon, subsequent encounter: Secondary | ICD-10-CM

## 2021-11-03 MED ORDER — HYDROCODONE-ACETAMINOPHEN 5-325 MG PO TABS: 1.0000 | ORAL_TABLET | Freq: Two times a day (BID) | ORAL | 0 refills | Status: DC | PRN

## 2021-11-03 NOTE — Addendum Note (Signed)
Addended by: Azucena Cecil on: 11/03/2021 03:49 PM   Modules accepted: Orders

## 2021-11-03 NOTE — Progress Notes (Signed)
Post-Op Visit Note   Patient: [name]           Date of Birth: 1951-02-05           MRN: [id number] Visit Date: 11/03/2021 PCP: Wendie Agreste, MD   Assessment & Plan:  Chief Complaint:  Chief Complaint  Patient presents with   Left Leg - Pain   Visit Diagnoses:  1. Traumatic rupture of left quadriceps tendon, subsequent encounter     Plan: Patient is a pleasant 71 year old gentleman who comes in today 4 weeks status post left quad repair 10/08/2021.  He has been doing okay.  He has been compliant wearing his hinged knee brace locked from 0 to 30 degrees.  Minimal to no pain.  Examination left knee shows range of motion from 0 to 30 degrees.  He is neurovascular intact distally.  At this point, would like to open his brace up to 60 degrees of flexion.  I will start him outpatient physical therapy and referrals were made.  Follow-up in 4 weeks for recheck.  Call with concerns or questions in meantime.  Follow-Up Instructions: Return in about 4 weeks (around 12/01/2021).   Orders:  Orders Placed This Encounter  Procedures   Ambulatory referral to Physical Therapy   No orders of the defined types were placed in this encounter.   Imaging: No new imaging  PMFS History: Patient Active Problem List   Diagnosis Date Noted   Traumatic rupture of left quadriceps tendon 09/30/2021   Ceruminosis, bilateral 04/01/2020   Acute non-ST elevation myocardial infarction (NSTEMI) ([place]) 05/04/2019   Statin myopathy 04/04/2019   Statin intolerance -weakness, stiffness 02/13/2019   Rupture of right supraspinatus tendon 01/18/2019   Complex tear of medial meniscus of left knee 09/14/2018   Malignant neoplasm of prostate ([place]) 01/04/2018   Refusal of blood transfusions as patient is Jehovah's Witness    Pneumonia    Osteoarthritis    Obstructive sleep apnea    Hyperlipidemia    HTN (hypertension)    GERD (gastroesophageal reflux disease)    Depression    Coronary artery disease     Chronic lower back pain    Chest pain    Arthritis    Anemia    Benign essential HTN 01/03/2017   PAT (paroxysmal atrial tachycardia) ([place]) 10/27/2015   Type 2 diabetes mellitus with circulatory disorder, without long-term current use of insulin ([place]) 06/19/2015   Angina pectoris ([place])    Elevated PSA 12/20/2014   CAD (coronary artery disease)    Obstructive sleep apnea 09/06/2014   Personal history of colonic polyps 12/13/2013   Blood in stool 12/13/2013   Prostatitis, acute 12/13/2013   Hematochezia 12/13/2013   Insomnia 04/19/2013   Obesity with body mass index of 30.0-39.9 10/18/2012   Right lumbar radiculitis 09/29/2011   Adenomatous colon polyp 04/05/2009   ORGANIC IMPOTENCE 11/11/2008   Hyperlipidemia LDL goal <70 08/23/2008   GERD 08/23/2008   BPH (benign prostatic hypertrophy) with urinary obstruction 08/23/2008   OSTEOARTHRITIS 08/23/2008   Past Medical History:  Diagnosis Date   Adenomatous colon polyp 2011   Anemia    Angina pectoris ([place]) 05/01/2015   med rx for 95% OM (u/a to access due to tortuosity), 95% D1 and other moderate CAD at cath   Arthritis    "back, knees" (05/01/2015)   Chest pain    pleuritic   Chronic lower back pain    Complex tear of medial meniscus of left knee 09/14/2018  Coronary artery disease    Depression    GERD (gastroesophageal reflux disease)    HTN (hypertension)    Hyperlipidemia    Obstructive sleep apnea    noncompliant with CPAP   Osteoarthritis    Pneumonia ~ 2014 X 1   Prostate cancer (HCC)    Refusal of blood transfusions as patient is Jehovah's Witness    Rotator cuff arthropathy, right    Rupture of right supraspinatus tendon 01/18/2019   Type II or unspecified type diabetes mellitus without mention of complication, not stated as uncontrolled     Family History  Problem Relation Age of Onset   Coronary artery disease Other        CABG   Coronary artery disease Mother    Stroke Mother    Heart disease  Mother    Diabetes Mother    Other Father    Diabetes Sister    Diabetes Maternal Uncle        x2   Alcohol abuse Other    Diabetes Paternal Uncle        x2   Colon cancer Paternal Uncle    Colon polyps Neg Hx    Esophageal cancer Neg Hx     Past Surgical History:  Procedure Laterality Date   CARDIAC CATHETERIZATION  2004   patent coronary arteries   CARDIAC CATHETERIZATION  05/01/2015   "tried to put stent in but couldn't"   CARDIAC CATHETERIZATION N/A 05/01/2015   Procedure: Left Heart Cath and Coronary Angiography;  Surgeon: Belva Crome, MD; LAD 60%, oD1 90%, pD1 70%, D2 70%, CFX patent stent, 30% distal to prev stent, pRCA 20%, OM1 90/95%, NL LV   CARDIAC CATHETERIZATION N/A 05/01/2015   Procedure: Coronary Stent Intervention;  Surgeon: Belva Crome, MD;  Unsuccessful PCI OM due to tortuosity   CARDIAC CATHETERIZATION N/A 11/06/2014   Procedure: Left Heart Cath and Coronary Angiography;  Surgeon: Belva Crome, MD;  Location: [facility];  Service: Cardiovascular;  Laterality: N/A;   CLOSED REDUCTION SHOULDER DISLOCATION Right ~ 1975   "& reattached muscle"   COLONOSCOPY W/ BIOPSIES AND POLYPECTOMY  X 2   ESOPHAGOGASTRODUODENOSCOPY ENDOSCOPY     KNEE ARTHROSCOPY WITH MEDIAL MENISECTOMY Left 09/14/2018   Procedure: LEFT KNEE ARTHROSCOPY CHONDROPLASY,  WITH MEDIAL MENISECTOMY;  Surgeon: Marchia Bond, MD;  Location: [facility];  Service: Orthopedics;  Laterality: Left;   PILONIDAL CYST EXCISION     SHOULDER ARTHROSCOPY WITH DEBRIDEMENT AND BICEP TENDON REPAIR Right 01/18/2019   Procedure: RIGHT SHOULDER ARTHROSCOPY WITH DEBRIDEMENT AND DECOMPRSSION SUBACROMIAL PARTIAL ACROMIOPLASTY;  Surgeon: Marchia Bond, MD;  Location: [facility];  Service: Orthopedics;  Laterality: Right;   SHOULDER ARTHROSCOPY WITH ROTATOR CUFF REPAIR Right 01/18/2019   Procedure: SHOULDER ARTHROSCOPY WITH ROTATOR CUFF REPAIR;  Surgeon: Marchia Bond, MD;  Location: [facility];  Service: Orthopedics;  Laterality: Right;   Social History   Occupational History   Occupation: Retired    Fish farm manager: UNEMPLOYED  Tobacco Use   Smoking status: Former    Packs/day: 0.50    Years: 10.00    Total pack years: 5.00    Types: Cigarettes    Quit date: 04/06/1983    Years since quitting: 38.6   Smokeless tobacco: Never  Vaping Use   Vaping Use: Never used  Substance and Sexual Activity   Alcohol use: Yes    Alcohol/week: 0.0 standard drinks of alcohol    Comment: `/26/2017 "might drink a  beer q couple months mostly; summertime I might drink 2-3 beers/week"   Drug use: No   Sexual activity: Not Currently

## 2021-11-09 ENCOUNTER — Ambulatory Visit (INDEPENDENT_AMBULATORY_CARE_PROVIDER_SITE_OTHER): Payer: Federal, State, Local not specified - PPO | Admitting: Physician Assistant

## 2021-11-09 VITALS — BP 166/81 | HR 64 | Ht 70.0 in | Wt 230.0 lb

## 2021-11-09 DIAGNOSIS — Z8546 Personal history of malignant neoplasm of prostate: Secondary | ICD-10-CM | POA: Diagnosis not present

## 2021-11-09 DIAGNOSIS — C61 Malignant neoplasm of prostate: Secondary | ICD-10-CM

## 2021-11-09 DIAGNOSIS — R350 Frequency of micturition: Secondary | ICD-10-CM

## 2021-11-09 DIAGNOSIS — N5314 Retrograde ejaculation: Secondary | ICD-10-CM

## 2021-11-09 LAB — BLADDER SCAN AMB NON-IMAGING

## 2021-11-09 MED ORDER — GEMTESA 75 MG PO TABS: 75.0000 mg | ORAL_TABLET | Freq: Every day | ORAL | 0 refills | Status: DC

## 2021-11-09 NOTE — Progress Notes (Signed)
11/09/2021 1:51 PM   [name] [birth year] [id number]  CC: Chief Complaint  Patient presents with   Urinary Frequency   Follow-up   HPI: [name] is a 71 y.o. male with PMH diabetes, prostate cancer s/p IMRT in 2019, ED, and urinary frequency who presents today for emptying recheck on Myrbetriq 50 mg daily.   Today he reports he received a shipment of condom catheters and has been using them.  He is changing them once daily and reports they fit well and are working well for him.  Overall, he does not think that the Myrbetriq has helped with his urinary symptoms.  He continues to report bothersome urgency and frequency despite cutting back on caffeinated beverages.  He also reports ongoing retrograde ejaculation that has been happening for "a while."  He states that nothing comes out when he ejaculates and then he has cloudy urine afterward.  PVR 58 mL.  PSA on 10/12/2021 was 0.9, previously 0.5 7 months ago.  PMH: Past Medical History:  Diagnosis Date   Adenomatous colon polyp 2011   Anemia    Angina pectoris ([place]) 05/01/2015   med rx for 95% OM (u/a to access due to tortuosity), 95% D1 and other moderate CAD at cath   Arthritis    "back, knees" (05/01/2015)   Chest pain    pleuritic   Chronic lower back pain    Complex tear of medial meniscus of left knee 09/14/2018   Coronary artery disease    Depression    GERD (gastroesophageal reflux disease)    HTN (hypertension)    Hyperlipidemia    Obstructive sleep apnea    noncompliant with CPAP   Osteoarthritis    Pneumonia ~ 2014 X 1   Prostate cancer ([place])    Refusal of blood transfusions as patient is Jehov[employer]'s Witness    Rotator cuff arthropathy, right    Rupture of right supraspinatus tendon 01/18/2019   Type II or unspecified type diabetes mellitus without mention of complication, not stated as uncontrolled     Surgical History: Past Surgical History:  Procedure Laterality Date   CARDIAC  CATHETERIZATION  2004   patent coronary arteries   CARDIAC CATHETERIZATION  05/01/2015   "tried to put stent in but couldn't"   CARDIAC CATHETERIZATION N/A 05/01/2015   Procedure: Left Heart Cath and Coronary Angiography;  Surgeon: Belva Crome, MD; LAD 60%, oD1 90%, pD1 70%, D2 70%, CFX patent stent, 30% distal to prev stent, pRCA 20%, OM1 90/95%, NL LV   CARDIAC CATHETERIZATION N/A 05/01/2015   Procedure: Coronary Stent Intervention;  Surgeon: Belva Crome, MD;  Unsuccessful PCI OM due to tortuosity   CARDIAC CATHETERIZATION N/A 11/06/2014   Procedure: Left Heart Cath and Coronary Angiography;  Surgeon: Belva Crome, MD;  Location: [facility];  Service: Cardiovascular;  Laterality: N/A;   CLOSED REDUCTION SHOULDER DISLOCATION Right ~ 1975   "& reattached muscle"   COLONOSCOPY W/ BIOPSIES AND POLYPECTOMY  X 2   ESOPHAGOGASTRODUODENOSCOPY ENDOSCOPY     KNEE ARTHROSCOPY WITH MEDIAL MENISECTOMY Left 09/14/2018   Procedure: LEFT KNEE ARTHROSCOPY CHONDROPLASY,  WITH MEDIAL MENISECTOMY;  Surgeon: Marchia Bond, MD;  Location: [facility];  Service: Orthopedics;  Laterality: Left;   PILONIDAL CYST EXCISION     SHOULDER ARTHROSCOPY WITH DEBRIDEMENT AND BICEP TENDON REPAIR Right 01/18/2019   Procedure: RIGHT SHOULDER ARTHROSCOPY WITH DEBRIDEMENT AND DECOMPRSSION SUBACROMIAL PARTIAL ACROMIOPLASTY;  Surgeon: Marchia Bond, MD;  Location: [facility];  Service: Orthopedics;  Laterality: Right;   SHOULDER ARTHROSCOPY WITH ROTATOR CUFF REPAIR Right 01/18/2019   Procedure: SHOULDER ARTHROSCOPY WITH ROTATOR CUFF REPAIR;  Surgeon: Marchia Bond, MD;  Location: [facility];  Service: Orthopedics;  Laterality: Right;    Home Medications:  Allergies as of 11/09/2021       Reactions   Statins Other (See Comments)   REACTION: joint pain Lipitor- headaches Has also tried Livalo, pravachol, zetia, crestor, welchol   Imdur [isosorbide Dinitrate]    Headache -  "violent"   Repatha [evolocumab]    Muscle weakness and soreness        Medication List        Accurate as of November 09, 2021  1:51 PM. If you have any questions, ask your nurse or doctor.          STOP taking these medications    mirabegron ER 50 MG Tb24 tablet Commonly known as: MYRBETRIQ       TAKE these medications    acetaminophen 325 MG tablet Commonly known as: Tylenol Take 2 tablets (650 mg total) by mouth every 6 (six) hours as needed for moderate pain.   albuterol 108 (90 Base) MCG/ACT inhaler Commonly known as: VENTOLIN HFA INHALE 1-2 PUFFS INTO THE LUNGS EVERY 4 HOURS AS NEEDED FOR WHEEZING OR SHORTNESS OF BREATH.   aspirin EC 81 MG tablet Take 81 mg by mouth daily. Swallow whole.   cetirizine 10 MG tablet Commonly known as: ZyrTEC Allergy Take 1 tablet (10 mg total) by mouth daily.   fluticasone 50 MCG/ACT nasal spray Commonly known as: FLONASE Place 1-2 sprays into both nostrils daily.   Gemtesa 75 MG Tabs Generic drug: Vibegron Take 75 mg by mouth daily.   glipiZIDE 5 MG 24 hr tablet Commonly known as: GLUCOTROL XL TAKE 1 TABLET BY MOUTH TWICE A DAY   glucose blood test strip Use as instructed to check sugar 2 times daily   hydrALAZINE 50 MG tablet Commonly known as: APRESOLINE Take 1 tablet (50 mg total) by mouth 3 (three) times daily.   hydrochlorothiazide 25 MG tablet Commonly known as: HYDRODIURIL TAKE 1 TABLET BY MOUTH EVERY DAY   HYDROcodone-acetaminophen 5-325 MG tablet Commonly known as: Norco Take 1 tablet by mouth 2 (two) times daily as needed.   loperamide 2 MG capsule Commonly known as: IMODIUM Take 1 capsule (2 mg total) by mouth 2 (two) times daily as needed for diarrhea or loose stools.   losartan 100 MG tablet Commonly known as: COZAAR TAKE 1 TABLET BY MOUTH EVERY DAY   metFORMIN 500 MG tablet Commonly known as: GLUCOPHAGE Take 2 tablets (1,000 mg total) by mouth 2 (two) times daily with a meal.    methocarbamol 750 MG tablet Commonly known as: Robaxin-750 Take 1 tablet (750 mg total) by mouth 2 (two) times daily as needed for muscle spasms.   metoprolol tartrate 50 MG tablet Commonly known as: LOPRESSOR TAKE 1 TABLET BY MOUTH TWICE A DAY   Moderna COVID-19 Bival Booster 50 MCG/0.5ML injection Generic drug: COVID-19 mRNA bivalent vaccine (Moderna) Inject into the muscle.   nitroGLYCERIN 0.4 MG SL tablet Commonly known as: NITROSTAT PLACE 1 TABLET UNDER THE TONGUE EVERY 5 MIN X 3 DOSES AS NEEDED FOR CHEST PAIN   ondansetron 4 MG tablet Commonly known as: Zofran Take 1 tablet (4 mg total) by mouth every 8 (eight) hours as needed for nausea or vomiting.   ondansetron 8 MG disintegrating tablet Commonly known as:  ZOFRAN-ODT Take 1 tablet (8 mg total) by mouth every 8 (eight) hours as needed for nausea or vomiting.   oxyCODONE 5 MG immediate release tablet Commonly known as: Oxy IR/ROXICODONE Take 1-2 tablets (5-10 mg total) by mouth 2 (two) times daily as needed for severe pain.   oxyCODONE-acetaminophen 5-325 MG tablet Commonly known as: Percocet Take 1-2 tablets by mouth every 6 (six) hours as needed.   pravastatin 40 MG tablet Commonly known as: PRAVACHOL Take 80 mg by mouth daily.   [employer] Facilities manager 1 Device by Does not apply route daily.   Semaglutide(0.25 or 0.'5MG'$ /DOS) 2 MG/3ML Sopn Inject 0.5 mg into the skin once a week.   sertraline 50 MG tablet Commonly known as: Zoloft Take 1 tablet (50 mg total) by mouth daily.   sertraline 25 MG tablet Commonly known as: ZOLOFT TAKE 1 TABLET (25 MG TOTAL) BY MOUTH DAILY.   sildenafil 20 MG tablet Commonly known as: REVATIO Take 3-5 tablets 1hr prior to intercourse   tiZANidine 2 MG tablet Commonly known as: ZANAFLEX Take 1 tablet (2 mg total) by mouth every 8 (eight) hours as needed for muscle spasms.        Allergies:  Allergies  Allergen Reactions   Statins Other (See Comments)     REACTION: joint pain Lipitor- headaches Has also tried Livalo, pravachol, zetia, crestor, welchol   Imdur [Isosorbide Dinitrate]     Headache - "violent"   Repatha [Evolocumab]     Muscle weakness and soreness    Family History: Family History  Problem Relation Age of Onset   Coronary artery disease Other        CABG   Coronary artery disease Mother    Stroke Mother    Heart disease Mother    Diabetes Mother    Other Father    Diabetes Sister    Diabetes Maternal Uncle        x2   Alcohol abuse Other    Diabetes Paternal Uncle        x2   Colon cancer Paternal Uncle    Colon polyps Neg Hx    Esophageal cancer Neg Hx     Social History:   reports that he quit smoking about 38 years ago. His smoking use included cigarettes. He has a 5.00 pack-year smoking history. He has never used smokeless tobacco. He reports current alcohol use. He reports that he does not use drugs.  Physical Exam: BP (!) 166/81   Pulse 64   Ht '5\' 10"'$  (1.778 m)   Wt 230 lb (104.3 kg)   BMI 33.00 kg/m   Constitutional:  Alert and oriented, no acute distress, nontoxic appearing HEENT: [place], AT Cardiovascular: No clubbing, cyanosis, or edema Respiratory: Normal respiratory effort, no increased work of breathing Skin: No rashes, bruises or suspicious lesions Neurologic: Grossly intact, no focal deficits, moving all 4 extremities Psychiatric: Normal mood and affect  Laboratory Data: Results for orders placed or performed in visit on 11/09/21  Bladder Scan (Post Void Residual) in office  Result Value Ref Range   Scan Result 73m    Assessment & Plan:   1. Urinary frequency No symptomatic improvement on Myrbetriq.  We will attempt a trial of Gemtesa as an alternative.  Samples provided today.  Counseled patient to continue condom catheters for security. - Bladder Scan (Post Void Residual) in office - Vibegron (GEMTESA) 75 MG TABS; Take 75 mg by mouth daily.  Dispense: 28 tablet; Refill: 0  2.  Malignant neoplasm of prostate ([place]) PSA slightly increased over prior, though he does not meet the criteria for biochemical recurrence.  This may represent PSA bounce.  Will continue to monitor.  3. Retrograde ejaculation Versus anejaculation.  Reassured the patient that this is a benign finding.  He is not on alpha blockers.  We discussed that this is likely due to radiation therapy and/or his history of diabetes.  Return in about 4 weeks (around 12/07/2021) for Symptom recheck with PVR.  Debroah Loop, PA-C  [facility] [address], [place], [place] [address] [phone]

## 2021-11-15 ENCOUNTER — Ambulatory Visit
Admission: EM | Admit: 2021-11-15 | Discharge: 2021-11-15 | Disposition: A | Discharge status: Home / Self Care | Payer: Federal, State, Local not specified - PPO | Attending: Emergency Medicine | Admitting: Emergency Medicine

## 2021-11-15 DIAGNOSIS — U071 COVID-19: Secondary | ICD-10-CM

## 2021-11-15 MED ORDER — PAXLOVID (150/100) 10 X 150 MG & 10 X 100MG PO TBPK: 3.0000 | ORAL_TABLET | Freq: Two times a day (BID) | ORAL | 0 refills | Status: AC

## 2021-11-15 MED ORDER — PROMETHAZINE-DM 6.25-15 MG/5ML PO SYRP: 5.0000 mL | ORAL_SOLUTION | Freq: Four times a day (QID) | ORAL | 0 refills | Status: DC | PRN

## 2021-11-15 NOTE — ED Triage Notes (Signed)
The patient states he tested positive for covid at home today.  Symptoms: fatigue, weakness, sore throat, congestion, cough, and lack of concentration.   Home interventions: just regular prescribed medications

## 2021-11-15 NOTE — Discharge Instructions (Addendum)
Please read the enclosed information regarding quarantine, isolation and self-care at home for COVID-19.  I have sent a prescription for Paxlovid to your pharmacy, please take 3 tablets in the morning and 3 tablets in the evening for the next 5 days.    I have reviewed your current medications carefully and do not see that there are any drug to drug interactions with Paxlovid with the exception of hydrocodone.  Taking Paxlovid at the same time as hydrocodone can increase serum concentrations of hydrocodone because the drug take longer to clear your system.    I recommend that if you continue hydrocodone that you take only take a half dose no more often than every 6 hours.  If you begin to become extremely short of breath or feel confused, please call 911 for more emergent evaluation.  Thank you for visiting urgent care today.

## 2021-11-15 NOTE — ED Provider Notes (Addendum)
[facility]    CSN: [id number] Arrival date & time: 11/15/21  1454    HISTORY   Chief Complaint  Patient presents with   COVID-19   HPI [name] is a pleasant, 71 y.o. male who presents to urgent care today. The patient states he tested positive for covid at home today.   Symptoms: fatigue, weakness, sore throat, congestion, cough, and lack of concentration.    Home interventions: just regular prescribed medications  Patient is accompanied by his wife today, patient suffers from dementia.  According to wife, patient lives with at home with her but does attend group activities for seniors suffering from dementia.  Wife denies known sick contacts.  The history is provided by the patient.   Past Medical History:  Diagnosis Date   Adenomatous colon polyp 2011   Anemia    Angina pectoris ([place]) 05/01/2015   med rx for 95% OM (u/a to access due to tortuosity), 95% D1 and other moderate CAD at cath   Arthritis    "back, knees" (05/01/2015)   Chest pain    pleuritic   Chronic lower back pain    Complex tear of medial meniscus of left knee 09/14/2018   Coronary artery disease    Depression    GERD (gastroesophageal reflux disease)    HTN (hypertension)    Hyperlipidemia    Obstructive sleep apnea    noncompliant with CPAP   Osteoarthritis    Pneumonia ~ 2014 X 1   Prostate cancer ([place])    Refusal of blood transfusions as patient is Jehovah's Witness    Rotator cuff arthropathy, right    Rupture of right supraspinatus tendon 01/18/2019   Type II or unspecified type diabetes mellitus without mention of complication, not stated as uncontrolled    Patient Active Problem List   Diagnosis Date Noted   Traumatic rupture of left quadriceps tendon 09/30/2021   Ceruminosis, bilateral 04/01/2020   Acute non-ST elevation myocardial infarction (NSTEMI) ([place]) 05/04/2019   Statin myopathy 04/04/2019   Statin intolerance -weakness, stiffness 02/13/2019   Rupture of  right supraspinatus tendon 01/18/2019   Complex tear of medial meniscus of left knee 09/14/2018   Malignant neoplasm of prostate ([place]) 01/04/2018   Refusal of blood transfusions as patient is Jehovah's Witness    Pneumonia    Osteoarthritis    Obstructive sleep apnea    Hyperlipidemia    HTN (hypertension)    GERD (gastroesophageal reflux disease)    Depression    Coronary artery disease    Chronic lower back pain    Chest pain    Arthritis    Anemia    Benign essential HTN 01/03/2017   PAT (paroxysmal atrial tachycardia) ([place]) 10/27/2015   Type 2 diabetes mellitus with circulatory disorder, without long-term current use of insulin ([place]) 06/19/2015   Angina pectoris ([place])    Elevated PSA 12/20/2014   CAD (coronary artery disease)    Obstructive sleep apnea 09/06/2014   Personal history of colonic polyps 12/13/2013   Blood in stool 12/13/2013   Prostatitis, acute 12/13/2013   Hematochezia 12/13/2013   Insomnia 04/19/2013   Obesity with body mass index of 30.0-39.9 10/18/2012   Right lumbar radiculitis 09/29/2011   Adenomatous colon polyp 04/05/2009   ORGANIC IMPOTENCE 11/11/2008   Hyperlipidemia LDL goal <70 08/23/2008   GERD 08/23/2008   BPH (benign prostatic hypertrophy) with urinary obstruction 08/23/2008   OSTEOARTHRITIS 08/23/2008   Past Surgical History:  Procedure Laterality Date   CARDIAC  CATHETERIZATION  2004   patent coronary arteries   CARDIAC CATHETERIZATION  05/01/2015   "tried to put stent in but couldn't"   CARDIAC CATHETERIZATION N/A 05/01/2015   Procedure: Left Heart Cath and Coronary Angiography;  Surgeon: Belva Crome, MD; LAD 60%, oD1 90%, pD1 70%, D2 70%, CFX patent stent, 30% distal to prev stent, pRCA 20%, OM1 90/95%, NL LV   CARDIAC CATHETERIZATION N/A 05/01/2015   Procedure: Coronary Stent Intervention;  Surgeon: Belva Crome, MD;  Unsuccessful PCI OM due to tortuosity   CARDIAC CATHETERIZATION N/A 11/06/2014   Procedure: Left Heart Cath and  Coronary Angiography;  Surgeon: Belva Crome, MD;  Location: [facility];  Service: Cardiovascular;  Laterality: N/A;   CLOSED REDUCTION SHOULDER DISLOCATION Right ~ 1975   "& reattached muscle"   COLONOSCOPY W/ BIOPSIES AND POLYPECTOMY  X 2   ESOPHAGOGASTRODUODENOSCOPY ENDOSCOPY     KNEE ARTHROSCOPY WITH MEDIAL MENISECTOMY Left 09/14/2018   Procedure: LEFT KNEE ARTHROSCOPY CHONDROPLASY,  WITH MEDIAL MENISECTOMY;  Surgeon: Marchia Bond, MD;  Location: [facility];  Service: Orthopedics;  Laterality: Left;   PILONIDAL CYST EXCISION     SHOULDER ARTHROSCOPY WITH DEBRIDEMENT AND BICEP TENDON REPAIR Right 01/18/2019   Procedure: RIGHT SHOULDER ARTHROSCOPY WITH DEBRIDEMENT AND DECOMPRSSION SUBACROMIAL PARTIAL ACROMIOPLASTY;  Surgeon: Marchia Bond, MD;  Location: [facility];  Service: Orthopedics;  Laterality: Right;   SHOULDER ARTHROSCOPY WITH ROTATOR CUFF REPAIR Right 01/18/2019   Procedure: SHOULDER ARTHROSCOPY WITH ROTATOR CUFF REPAIR;  Surgeon: Marchia Bond, MD;  Location: [facility];  Service: Orthopedics;  Laterality: Right;    Home Medications    Prior to Admission medications   Medication Sig Start Date End Date Taking? Authorizing Provider  acetaminophen (TYLENOL) 325 MG tablet Take 2 tablets (650 mg total) by mouth every 6 (six) hours as needed for moderate pain. 09/12/21   Jaynee Eagles, PA-C  albuterol (VENTOLIN HFA) 108 (90 Base) MCG/ACT inhaler INHALE 1-2 PUFFS INTO THE LUNGS EVERY 4 HOURS AS NEEDED FOR WHEEZING OR SHORTNESS OF BREATH. 02/16/21   Wendie Agreste, MD  aspirin EC 81 MG tablet Take 81 mg by mouth daily. Swallow whole.    [provider]  Blood Glucose Monitoring Suppl (RELION PREMIER BLU MONITOR) DEVI 1 Device by Does not apply route daily. 07/12/16   [name], MD  cetirizine (ZYRTEC ALLERGY) 10 MG tablet Take 1 tablet (10 mg total) by mouth daily. 09/12/21   Jaynee Eagles, PA-C  fluticasone (FLONASE)  50 MCG/ACT nasal spray Place 1-2 sprays into both nostrils daily. 04/30/20   Wendie Agreste, MD  glipiZIDE (GLUCOTROL XL) 5 MG 24 hr tablet TAKE 1 TABLET BY MOUTH TWICE A DAY 09/15/21   [name], MD  glucose blood test strip Use as instructed to check sugar 2 times daily 07/12/16   [name], MD  hydrALAZINE (APRESOLINE) 50 MG tablet Take 1 tablet (50 mg total) by mouth 3 (three) times daily. 06/06/20   Belva Crome, MD  hydrochlorothiazide (HYDRODIURIL) 25 MG tablet TAKE 1 TABLET BY MOUTH EVERY DAY 09/15/21   Belva Crome, MD  HYDROcodone-acetaminophen (NORCO) 5-325 MG tablet Take 1 tablet by mouth 2 (two) times daily as needed. 11/03/21   Leandrew Koyanagi, MD  losartan (COZAAR) 100 MG tablet TAKE 1 TABLET BY MOUTH EVERY DAY 09/15/21   Belva Crome, MD  metFORMIN (GLUCOPHAGE) 500 MG tablet Take 2 tablets (1,000 mg total) by mouth 2 (two) times daily  with a meal. 09/09/20   [name], MD  methocarbamol (ROBAXIN-750) 750 MG tablet Take 1 tablet (750 mg total) by mouth 2 (two) times daily as needed for muscle spasms. 10/07/21   Aundra Dubin, PA-C  metoprolol tartrate (LOPRESSOR) 50 MG tablet TAKE 1 TABLET BY MOUTH TWICE A DAY 10/09/20   Belva Crome, MD  nitroGLYCERIN (NITROSTAT) 0.4 MG SL tablet PLACE 1 TABLET UNDER THE TONGUE EVERY 5 MIN X 3 DOSES AS NEEDED FOR CHEST PAIN 08/02/19   Belva Crome, MD  Semaglutide,0.25 or 0.'5MG'$ /DOS, 2 MG/3ML SOPN Inject 0.5 mg into the skin once a week. 08/25/21   [name], MD  sertraline (ZOLOFT) 25 MG tablet TAKE 1 TABLET (25 MG TOTAL) BY MOUTH DAILY. 05/07/21   Wendie Agreste, MD  sildenafil (REVATIO) 20 MG tablet Take 3-5 tablets 1hr prior to intercourse 04/14/21   [name], MD  Vibegron (GEMTESA) 75 MG TABS Take 75 mg by mouth daily. 11/09/21   Debroah Loop, PA-C    Family History Family History  Problem Relation Age of Onset   Coronary artery disease Other        CABG   Coronary artery disease Mother    Stroke  Mother    Heart disease Mother    Diabetes Mother    Other Father    Diabetes Sister    Diabetes Maternal Uncle        x2   Alcohol abuse Other    Diabetes Paternal Uncle        x2   Colon cancer Paternal Uncle    Colon polyps Neg Hx    Esophageal cancer Neg Hx    Social History Social History   Tobacco Use   Smoking status: Former    Packs/day: 0.50    Years: 10.00    Total pack years: 5.00    Types: Cigarettes    Quit date: 04/06/1983    Years since quitting: 38.6   Smokeless tobacco: Never  Vaping Use   Vaping Use: Never used  Substance Use Topics   Alcohol use: Yes    Alcohol/week: 0.0 standard drinks of alcohol    Comment: `/26/2017 "might drink a beer q couple months mostly; summertime I might drink 2-3 beers/week"   Drug use: No   Allergies   Statins, Imdur [isosorbide dinitrate], and Repatha [evolocumab]  Review of Systems Review of Systems Pertinent findings revealed after performing a 14 point review of systems has been noted in the history of present illness.  Physical Exam Triage Vital Signs ED Triage Vitals  Enc Vitals Group     BP 01/30/21 0827 (!) 147/82     Pulse Rate 01/30/21 0827 72     Resp 01/30/21 0827 18     Temp 01/30/21 0827 98.3 F (36.8 C)     Temp Source 01/30/21 0827 Oral     SpO2 01/30/21 0827 98 %     Weight --      Height --      Head Circumference --      Peak Flow --      Pain Score 01/30/21 0826 5     Pain Loc --      Pain Edu? --      Excl. in [place]? --   No data found.  Updated Vital Signs BP 134/89 (BP Location: Right Arm)   Pulse 84   Temp 97.7 F (36.5 C) (Oral)   Resp 18   SpO2 95%  Physical Exam Vitals and nursing note reviewed.  Constitutional:      General: He is not in acute distress.    Appearance: Normal appearance. He is not ill-appearing.  HENT:     Head: Normocephalic and atraumatic.     Salivary Glands: Right salivary gland is not diffusely enlarged or tender. Left salivary gland is not  diffusely enlarged or tender.     Right Ear: Tympanic membrane, ear canal and external ear normal. No drainage. No middle ear effusion. There is no impacted cerumen. Tympanic membrane is not erythematous or bulging.     Left Ear: Tympanic membrane, ear canal and external ear normal. No drainage.  No middle ear effusion. There is no impacted cerumen. Tympanic membrane is not erythematous or bulging.     Nose: Nose normal. No nasal deformity, septal deviation, mucosal edema, congestion or rhinorrhea.     Right Turbinates: Not enlarged, swollen or pale.     Left Turbinates: Not enlarged, swollen or pale.     Right Sinus: No maxillary sinus tenderness or frontal sinus tenderness.     Left Sinus: No maxillary sinus tenderness or frontal sinus tenderness.     Mouth/Throat:     Lips: Pink. No lesions.     Mouth: Mucous membranes are moist. No oral lesions.     Pharynx: Oropharynx is clear. Uvula midline. No posterior oropharyngeal erythema or uvula swelling.     Tonsils: No tonsillar exudate. 0 on the right. 0 on the left.  Eyes:     General: Lids are normal.        Right eye: No discharge.        Left eye: No discharge.     Extraocular Movements: Extraocular movements intact.     Conjunctiva/sclera: Conjunctivae normal.     Right eye: Right conjunctiva is not injected.     Left eye: Left conjunctiva is not injected.  Neck:     Trachea: Trachea and phonation normal.  Cardiovascular:     Rate and Rhythm: Normal rate and regular rhythm.     Pulses: Normal pulses.     Heart sounds: Normal heart sounds. No murmur heard.    No friction rub. No gallop.  Pulmonary:     Effort: Pulmonary effort is normal. No accessory muscle usage, prolonged expiration or respiratory distress.     Breath sounds: Normal breath sounds. No stridor, decreased air movement or transmitted upper airway sounds. No decreased breath sounds, wheezing, rhonchi or rales.  Chest:     Chest wall: No tenderness.  Musculoskeletal:         General: Normal range of motion.     Cervical back: Normal range of motion and neck supple. Normal range of motion.  Lymphadenopathy:     Cervical: No cervical adenopathy.  Skin:    General: Skin is warm and dry.     Findings: No erythema or rash.  Neurological:     General: No focal deficit present.     Mental Status: He is alert and oriented to person, place, and time.  Psychiatric:        Mood and Affect: Mood normal.        Behavior: Behavior normal.     Visual Acuity Right Eye Distance:   Left Eye Distance:   Bilateral Distance:    Right Eye Near:   Left Eye Near:    Bilateral Near:     UC Couse / Diagnostics / Procedures:     Radiology No results found.  Procedures Procedures (including critical care time) EKG  Pending results:  Labs Reviewed - No data to display  Medications Ordered in UC: Medications - No data to display  UC Diagnoses / Final Clinical Impressions(s)   I have reviewed the triage vital signs and the nursing notes.  Pertinent labs & imaging results that were available during my care of the patient were reviewed by me and considered in my medical decision making (see chart for details).    Final diagnoses:  COVID-19   Patient is in excellent spirits today, well-appearing on exam.  EMR reviewed, patient has good kidney function, Paxlovid provided, no drug to drug interactions were appreciated with Paxlovid with the exception of hydrocodone which patient states he does not take regularly.  Wife states she agrees to avoid giving it to him and only give a half dose if needed for pain while taking Paxlovid.  ED precautions advised.  ED Prescriptions     Medication Sig Dispense Auth. Provider   nirmatrelvir & ritonavir (PAXLOVID, 150/100,) 10 x 150 MG & 10 x '100MG'$  TBPK Take 3 tablets by mouth 2 (two) times daily for 5 days. 30 tablet Lynden Oxford Scales, PA-C   promethazine-dextromethorphan (PROMETHAZINE-DM) 6.25-15 MG/5ML syrup Take 5  mLs by mouth 4 (four) times daily as needed for cough. 60 mL Lynden Oxford Scales, PA-C      I have reviewed the PDMP during this encounter.  Disposition Upon Discharge:  Condition: stable for discharge home Home: take medications as prescribed; routine discharge instructions as discussed; follow up as advised.  Patient presented with an acute illness with associated systemic symptoms and significant discomfort requiring urgent management. In my opinion, this is a condition that a prudent lay person (someone who possesses an average knowledge of health and medicine) may potentially expect to result in complications if not addressed urgently such as respiratory distress, impairment of bodily function or dysfunction of bodily organs.   Routine symptom specific, illness specific and/or disease specific instructions were discussed with the patient and/or caregiver at length.   As such, the patient has been evaluated and assessed, work-up was performed and treatment was provided in alignment with urgent care protocols and evidence based medicine.  Patient/parent/caregiver has been advised that the patient may require follow up for further testing and treatment if the symptoms continue in spite of treatment, as clinically indicated and appropriate.  If the patient was tested for COVID-19, Influenza and/or RSV, then the patient/parent/guardian was advised to isolate at home pending the results of his/her diagnostic coronavirus test and potentially longer if they're positive. I have also advised pt that if his/her COVID-19 test returns positive, it's recommended to self-isolate for at least 10 days after symptoms first appeared AND until fever-free for 24 hours without fever reducer AND other symptoms have improved or resolved. Discussed self-isolation recommendations as well as instructions for household member/close contacts as per the [facility] and [place] [facility], and also gave patient the [facility] packet with this  information.  Patient/parent/caregiver has been advised to return to the [facility] or PCP in 3-5 days if no better; to PCP or the Emergency Department if new signs and symptoms develop, or if the current signs or symptoms continue to change or worsen for further workup, evaluation and treatment as clinically indicated and appropriate  The patient will follow up with their current PCP if and as advised. If the patient does not currently have a PCP we will assist them in obtaining one.   The patient may  need specialty follow up if the symptoms continue, in spite of conservative treatment and management, for further workup, evaluation, consultation and treatment as clinically indicated and appropriate.  Patient/parent/caregiver verbalized understanding and agreement of plan as discussed.  All questions were addressed during visit.  Please see discharge instructions below for further details of plan.  Discharge Instructions:   Discharge Instructions      Please read the enclosed information regarding quarantine, isolation and self-care at home for COVID-19.  I have sent a prescription for Paxlovid to your pharmacy, please take 3 tablets in the morning and 3 tablets in the evening for the next 5 days.    I have reviewed your current medications carefully and do not see that there are any drug to drug interactions with Paxlovid with the exception of hydrocodone.  Taking Paxlovid at the same time as hydrocodone can increase serum concentrations of hydrocodone because the drug take longer to clear your system.    I recommend that if you continue hydrocodone that you take only take a half dose no more often than every 6 hours.  If you begin to become extremely short of breath or feel confused, please call 911 for more emergent evaluation.  Thank you for visiting urgent care today.      This office note has been dictated using Museum/gallery curator.  Unfortunately, this method of  dictation can sometimes lead to typographical or grammatical errors.  I apologize for your inconvenience in advance if this occurs.  Please do not hesitate to reach out to me if clarification is needed.      Lynden Oxford Scales, PA-C 11/16/21 1454    Lynden Oxford Scales, PA-C 11/16/21 1455

## 2021-11-17 HISTORY — PX: KNEE SURGERY: SHX244

## 2021-11-18 ENCOUNTER — Other Ambulatory Visit: Payer: Self-pay | Admitting: Internal Medicine

## 2021-11-18 ENCOUNTER — Other Ambulatory Visit: Payer: Self-pay | Admitting: Family Medicine

## 2021-11-18 ENCOUNTER — Other Ambulatory Visit: Payer: Self-pay | Admitting: Lab

## 2021-11-18 DIAGNOSIS — R059 Cough, unspecified: Secondary | ICD-10-CM

## 2021-11-18 NOTE — Telephone Encounter (Signed)
Last filled in 02/2021.  I will refill albuterol but can discuss asthma, albuterol at upcoming visit.  It appears he was scheduled September 6, that is canceled currently, make sure he reschedules.  Thanks.

## 2021-11-19 ENCOUNTER — Ambulatory Visit: Payer: Federal, State, Local not specified - PPO | Admitting: Physical Therapy

## 2021-11-20 ENCOUNTER — Telehealth: Payer: Self-pay | Admitting: Orthopaedic Surgery

## 2021-11-20 ENCOUNTER — Telehealth: Payer: Self-pay

## 2021-11-20 NOTE — Telephone Encounter (Signed)
Patient states that the medication Zella Ball changed him to is working.

## 2021-11-20 NOTE — Telephone Encounter (Signed)
Patient called. Says the brace is not working for him. Will not stay on. Would like to know if he can just order one from [employer]. His call back number is [phone]

## 2021-11-20 NOTE — Telephone Encounter (Signed)
Tried to call patient. No answer. No voicemail set up. Will try again later.

## 2021-11-23 NOTE — Telephone Encounter (Signed)
Patient states that his brace will not stay up. He tightens it but comes down with activity. He wants to know if there is something else he can try. He also wants to know if he should be wearing this brace while in PT.  Please advise and I'll call patient back.

## 2021-11-23 NOTE — Telephone Encounter (Signed)
Yeah that brace does do that in everyone.  He can come in for nurse check if he wants someone to look at it.  Maybe we can identify something for him.  Thanks.

## 2021-11-23 NOTE — Telephone Encounter (Signed)
Spoke with patient. He will call us if he decides to come in for [place] to look at it. He said that he has been putting an ace wrap over top of the brace and that helps to hold it in place.

## 2021-11-30 ENCOUNTER — Encounter: Payer: Self-pay | Admitting: Urology

## 2021-12-01 ENCOUNTER — Ambulatory Visit (INDEPENDENT_AMBULATORY_CARE_PROVIDER_SITE_OTHER): Payer: Federal, State, Local not specified - PPO | Admitting: Physical Therapy

## 2021-12-01 ENCOUNTER — Encounter: Payer: Self-pay | Admitting: Physical Therapy

## 2021-12-01 DIAGNOSIS — M25562 Pain in left knee: Secondary | ICD-10-CM

## 2021-12-01 DIAGNOSIS — R29898 Other symptoms and signs involving the musculoskeletal system: Secondary | ICD-10-CM

## 2021-12-01 DIAGNOSIS — M6281 Muscle weakness (generalized): Secondary | ICD-10-CM
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
## 2021-12-02 ENCOUNTER — Encounter: Payer: Self-pay | Admitting: Emergency Medicine

## 2021-12-02 ENCOUNTER — Emergency Department
Admission: EM | Admit: 2021-12-02 | Discharge: 2021-12-02 | Disposition: A | Discharge status: Home / Self Care | Payer: Federal, State, Local not specified - PPO | Attending: Emergency Medicine | Admitting: Emergency Medicine

## 2021-12-02 ENCOUNTER — Other Ambulatory Visit: Payer: Self-pay

## 2021-12-02 DIAGNOSIS — R319 Hematuria, unspecified: Secondary | ICD-10-CM | POA: Diagnosis not present

## 2021-12-02 DIAGNOSIS — R310 Gross hematuria: Secondary | ICD-10-CM | POA: Diagnosis not present

## 2021-12-02 LAB — CBC
HCT: 40.2 % (ref 39.0–52.0)
Hemoglobin: 12.9 g/dL — ABNORMAL LOW (ref 13.0–17.0)
MCH: 27.7 pg (ref 26.0–34.0)
MCHC: 32.1 g/dL (ref 30.0–36.0)
MCV: 86.5 fL (ref 80.0–100.0)
Platelets: 274 10*3/uL (ref 150–400)
RBC: 4.65 MIL/uL (ref 4.22–5.81)
RDW: 14.1 % (ref 11.5–15.5)
WBC: 5.5 10*3/uL (ref 4.0–10.5)
nRBC: 0 % (ref 0.0–0.2)

## 2021-12-02 LAB — BASIC METABOLIC PANEL
Anion gap: 9 (ref 5–15)
BUN: 17 mg/dL (ref 8–23)
CO2: 24 mmol/L (ref 22–32)
Calcium: 9.3 mg/dL (ref 8.9–10.3)
Chloride: 107 mmol/L (ref 98–111)
Creatinine, Ser: 1.03 mg/dL (ref 0.61–1.24)
GFR, Estimated: >60 mL/min (ref >60)
Glucose, Bld: 190 mg/dL — ABNORMAL HIGH (ref 70–99)
Potassium: 3.7 mmol/L (ref 3.5–5.1)
Sodium: 140 mmol/L (ref 135–145)

## 2021-12-02 LAB — URINALYSIS, ROUTINE W REFLEX MICROSCOPIC: Specific Gravity, Urine: 1.02 (ref 1.005–1.030)

## 2021-12-02 LAB — URINALYSIS, MICROSCOPIC (REFLEX)
RBC / HPF: >50 RBC/hpf (ref 0–5)
Squamous Epithelial / HPF: NONE SEEN (ref 0–5)
WBC, UA: >50 WBC/hpf (ref 0–5)

## 2021-12-02 MED ORDER — CEPHALEXIN 500 MG PO CAPS
500.0000 mg | ORAL_CAPSULE | Freq: Four times a day (QID) | ORAL | 0 refills | Status: DC
Start: 2021-12-02 — End: 2021-12-10

## 2021-12-02 MED ORDER — CEPHALEXIN 500 MG PO CAPS: 500.0000 mg | ORAL_CAPSULE | Freq: Four times a day (QID) | ORAL | 0 refills | Status: DC

## 2021-12-02 NOTE — Discharge Instructions (Addendum)
Please seek medical attention for any high fevers, chest pain, shortness of breath, change in behavior, persistent vomiting, bloody stool or any other new or concerning symptoms.  

## 2021-12-02 NOTE — ED Notes (Signed)
Dr. Goodman, EDP at bedside at this time.  

## 2021-12-02 NOTE — ED Triage Notes (Signed)
Pt to ED via POV stating that he has had blood in his urine. Pt denies use of blood thinners. Pt has had symptoms x 2 days.

## 2021-12-02 NOTE — ED Provider Notes (Signed)
[facility] Provider Note    Event Date/Time   First MD Initiated Contact with Patient 12/02/21 [phone]     (approximate)   History   Hematuria   HPI  [name] is a 71 y.o. male  who presents to the emergency department today because of concern for bloody urine. The patient states that every time he urinates he sees blood in his urine. Also feels like has seen tissue in the urine as well. He has had some discomfort at the end of urination. Does have an appointment with urology early next month but says he wanted to come to the emergency department today because he felt it was an emergency and so he could "bypass" the urologists and get tests run. He is not pleased with the care he has received at the urologists office because he states he gave them a sample of urine with semen in it and he through it away and did not seem to care about it. Per urology note dated 11/09/21 he has retrograde ejaculation and that it was discussed with the patient.       Physical Exam   Triage Vital Signs: ED Triage Vitals [12/02/21 0757]  Enc Vitals Group     BP (!) 170/79     Pulse Rate 69     Resp 18     Temp 97.6 F (36.4 C)     Temp Source Oral     SpO2 96 %     Weight      Height      Head Circumference      Peak Flow      Pain Score      Pain Loc      Pain Edu?      Excl. in [place]?     Most recent vital signs: Vitals:   12/02/21 0757  BP: (!) 170/79  Pulse: 69  Resp: 18  Temp: 97.6 F (36.4 C)  SpO2: 96%    General: Awake, alert, oriented. CV:  Good peripheral perfusion. Regular rate and rhythm. Resp:  Normal effort. Lungs clear. Abd:  No distention.     ED Results / Procedures / Treatments   Labs (all labs ordered are listed, but only abnormal results are displayed) Labs Reviewed  CBC - Abnormal; Notable for the following components:      Result Value   Hemoglobin 12.9 (*)    All other components within normal limits  BASIC METABOLIC  PANEL - Abnormal; Notable for the following components:   Glucose, Bld 190 (*)    All other components within normal limits  URINALYSIS, ROUTINE W REFLEX MICROSCOPIC - Abnormal; Notable for the following components:   Color, Urine RED (*)    APPearance CLOUDY (*)    Glucose, UA   (*)    Value: TEST NOT REPORTED DUE TO COLOR INTERFERENCE OF URINE PIGMENT   Hgb urine dipstick   (*)    Value: TEST NOT REPORTED DUE TO COLOR INTERFERENCE OF URINE PIGMENT   Bilirubin Urine   (*)    Value: TEST NOT REPORTED DUE TO COLOR INTERFERENCE OF URINE PIGMENT   Ketones, ur   (*)    Value: TEST NOT REPORTED DUE TO COLOR INTERFERENCE OF URINE PIGMENT   Protein, ur   (*)    Value: TEST NOT REPORTED DUE TO COLOR INTERFERENCE OF URINE PIGMENT   Nitrite   (*)    Value: TEST NOT REPORTED DUE TO COLOR INTERFERENCE OF  URINE PIGMENT   Leukocytes,Ua   (*)    Value: TEST NOT REPORTED DUE TO COLOR INTERFERENCE OF URINE PIGMENT   All other components within normal limits  URINALYSIS, MICROSCOPIC (REFLEX) - Abnormal; Notable for the following components:   Bacteria, UA FEW (*)    All other components within normal limits  URINE CULTURE     EKG  None   RADIOLOGY None   PROCEDURES:  Critical Care performed: No  Procedures   MEDICATIONS ORDERED IN ED: Medications - No data to display   IMPRESSION / MDM / [place] / ED COURSE  I reviewed the triage vital signs and the nursing notes.                              Differential diagnosis includes, but is not limited to, cancer, infection.  Patient's presentation is most consistent with acute presentation with potential threat to life or bodily function.  Patient presented to the emergency department today because of concerns for gross hematuria.  Patient is neither hypotensive nor tachycardic.  Blood work with hemoglobin of 12.9.  At this time I doubt any significant blood loss.  Urine results do show a large amount of blood.  Showed some  bacteria and white blood cells.  While I do have low suspicion for infection I do think might benefit to try patient on a course of antibiotics.  Did have a discussion with the patient about follow-up.  I did discuss urology would be the appropriate service.  He appears very unhappy with his current urologic care.  I did discuss with patient that he can certainly seek out other urology clinics.  I did try to explain to patient however that these are the specialist that would help with blood in the urine.  Discussed that they can do test that we do not do here in the emergency department.  Discussed possibility of cystoscopy.  FINAL CLINICAL IMPRESSION(S) / ED DIAGNOSES   Final diagnoses:  Hematuria, unspecified type     Note:  This document was prepared using Dragon voice recognition software and may include unintentional dictation errors.    Nance Pear, MD 12/02/21 [phone]

## 2021-12-04 LAB — URINE CULTURE: Culture: >=100000 — AB

## 2021-12-04 NOTE — Therapy (Signed)
OUTPATIENT PHYSICAL THERAPY TREATMENT   Patient Name: [name] MRN: [id number] DOB:[birth year], 71 y.o., male Today's Date: 12/09/2021  END OF SESSION:   PT End of Session - 12/09/21 1507     Visit Number 2    Number of Visits 17    Date for PT Re-Evaluation 01/26/22    Authorization Type [employer] and [employer]    Authorization Time Period 12/01/21 to 03/03/2022    Authorization - Visit Number 2    Authorization - Number of Visits 12    PT Start Time 9798    PT Stop Time 9211    PT Time Calculation (min) 40 min    Activity Tolerance Patient tolerated treatment well    Behavior During Therapy [facility] for tasks assessed/performed              Past Medical History:  Diagnosis Date   Adenomatous colon polyp 2011   Anemia    Angina pectoris ([place]) 05/01/2015   med rx for 95% OM (u/a to access due to tortuosity), 95% D1 and other moderate CAD at cath   Arthritis    "back, knees" (05/01/2015)   Chest pain    pleuritic   Chronic lower back pain    Complex tear of medial meniscus of left knee 09/14/2018   Coronary artery disease    Depression    GERD (gastroesophageal reflux disease)    HTN (hypertension)    Hyperlipidemia    Obstructive sleep apnea    noncompliant with CPAP   Osteoarthritis    Pneumonia ~ 2014 X 1   Prostate cancer ([place])    Refusal of blood transfusions as patient is Jehovah's Witness    Rotator cuff arthropathy, right    Rupture of right supraspinatus tendon 01/18/2019   Type II or unspecified type diabetes mellitus without mention of complication, not stated as uncontrolled    Past Surgical History:  Procedure Laterality Date   CARDIAC CATHETERIZATION  2004   patent coronary arteries   CARDIAC CATHETERIZATION  05/01/2015   "tried to put stent in but couldn't"   CARDIAC CATHETERIZATION N/A 05/01/2015   Procedure: Left Heart Cath and Coronary Angiography;  Surgeon: Belva Crome, MD; LAD 60%, oD1 90%, pD1 70%, D2 70%, CFX patent stent, 30% distal to prev  stent, pRCA 20%, OM1 90/95%, NL LV   CARDIAC CATHETERIZATION N/A 05/01/2015   Procedure: Coronary Stent Intervention;  Surgeon: Belva Crome, MD;  Unsuccessful PCI OM due to tortuosity   CARDIAC CATHETERIZATION N/A 11/06/2014   Procedure: Left Heart Cath and Coronary Angiography;  Surgeon: Belva Crome, MD;  Location: [place] [facility];  Service: Cardiovascular;  Laterality: N/A;   CLOSED REDUCTION SHOULDER DISLOCATION Right ~ 1975   "& reattached muscle"   COLONOSCOPY W/ BIOPSIES AND POLYPECTOMY  X 2   ESOPHAGOGASTRODUODENOSCOPY ENDOSCOPY     KNEE ARTHROSCOPY WITH MEDIAL MENISECTOMY Left 09/14/2018   Procedure: LEFT KNEE ARTHROSCOPY CHONDROPLASY,  WITH MEDIAL MENISECTOMY;  Surgeon: Marchia Bond, MD;  Location: [place];  Service: Orthopedics;  Laterality: Left;   PILONIDAL CYST EXCISION     SHOULDER ARTHROSCOPY WITH DEBRIDEMENT AND BICEP TENDON REPAIR Right 01/18/2019   Procedure: RIGHT SHOULDER ARTHROSCOPY WITH DEBRIDEMENT AND DECOMPRSSION SUBACROMIAL PARTIAL ACROMIOPLASTY;  Surgeon: Marchia Bond, MD;  Location: [place];  Service: Orthopedics;  Laterality: Right;   SHOULDER ARTHROSCOPY WITH ROTATOR CUFF REPAIR Right 01/18/2019   Procedure: SHOULDER ARTHROSCOPY WITH ROTATOR CUFF REPAIR;  Surgeon: Marchia Bond, MD;  Location:  [place];  Service: Orthopedics;  Laterality: Right;   Patient Active Problem List   Diagnosis Date Noted   Traumatic rupture of left quadriceps tendon 09/30/2021   Ceruminosis, bilateral 04/01/2020   Acute non-ST elevation myocardial infarction (NSTEMI) ([place]) 05/04/2019   Statin myopathy 04/04/2019   Statin intolerance -weakness, stiffness 02/13/2019   Rupture of right supraspinatus tendon 01/18/2019   Complex tear of medial meniscus of left knee 09/14/2018   Malignant neoplasm of prostate ([place]) 01/04/2018   Refusal of blood transfusions as patient is Jehovah's Witness    Pneumonia    Osteoarthritis     Obstructive sleep apnea    Hyperlipidemia    HTN (hypertension)    GERD (gastroesophageal reflux disease)    Depression    Coronary artery disease    Chronic lower back pain    Chest pain    Arthritis    Anemia    Benign essential HTN 01/03/2017   PAT (paroxysmal atrial tachycardia) ([place]) 10/27/2015   Type 2 diabetes mellitus with circulatory disorder, without long-term current use of insulin ([place]) 06/19/2015   Angina pectoris ([place])    Elevated PSA 12/20/2014   CAD (coronary artery disease)    Obstructive sleep apnea 09/06/2014   Personal history of colonic polyps 12/13/2013   Blood in stool 12/13/2013   Prostatitis, acute 12/13/2013   Hematochezia 12/13/2013   Insomnia 04/19/2013   Obesity with body mass index of 30.0-39.9 10/18/2012   Right lumbar radiculitis 09/29/2011   Adenomatous colon polyp 04/05/2009   ORGANIC IMPOTENCE 11/11/2008   Hyperlipidemia LDL goal <70 08/23/2008   GERD 08/23/2008   BPH (benign prostatic hypertrophy) with urinary obstruction 08/23/2008   OSTEOARTHRITIS 08/23/2008    PCP: [name]   REFERRING PROVIDER: Aundra Dubin, PA-C   REFERRING DIAG: [id number] (ICD-10-CM) - Traumatic rupture of left quadriceps tendon, subsequent encounter   THERAPY DIAG:  Acute pain of left knee  Muscle weakness (generalized)  Other symptoms and signs involving the musculoskeletal system  Rationale for Evaluation and Treatment Rehabilitation  ONSET DATE: 11/03/2021   SUBJECTIVE:   SUBJECTIVE STATEMENT: Pt indicated no pain today upon arrival.  Some soreness reported off and on.   Arrived with knee brace around ankle on Lt leg.   PERTINENT HISTORY:  PAIN:  NPRS scale: 0/10 Pain location: left leg  Pain description: unclear  Aggravating factors: working out, walking  Relieving factors: sitting down, rest   PRECAUTIONS: Other: quad repair protocol  (see protocol guidelines in cabinet based off time since surgery)  WEIGHT BEARING RESTRICTIONS  No  FALLS:  Has patient fallen in last 6 months? Yes. Number of falls 2-3, 2 falls on the bike; no fear of falling   LIVING ENVIRONMENT: Lives with: lives with their spouse Lives in: House/apartment Stairs: 5-6 STE U rail, no steps inside  Has following equipment at home: None  OCCUPATION: retired   PLOF: Independent, Independent with basic ADLs, Independent with gait, and Independent with transfers  PATIENT GOALS ride bike again    OBJECTIVE:   DIAGNOSTIC FINDINGS: X-rays demonstrate advanced degenerative changes the medial compartment.   Moderate degenerative changes to the patellofemoral compartment   PATIENT SURVEYS:  12/09/2021: FOTO:   COGNITION:  Overall cognitive status: Within functional limits for tasks assessed     SENSATION:   EDEMA:    MUSCLE LENGTH:  POSTURE:   PALPATION:   LOWER EXTREMITY ROM:  Active ROM Right eval Left eval  Hip flexion    Hip extension  Hip abduction    Hip adduction    Hip internal rotation    Hip external rotation    Knee flexion 114 114  Knee extension 6 5  Ankle dorsiflexion    Ankle plantarflexion    Ankle inversion    Ankle eversion     (Blank rows = not tested)  LOWER EXTREMITY MMT:  MMT Right eval Left eval  Hip flexion 4+ 3+  Hip extension    Hip abduction 5 5  Hip adduction    Hip internal rotation    Hip external rotation    Knee flexion 4 2+  Knee extension 5 2/5 (no MMT at eval, unable to fully extend against gravity)  Ankle dorsiflexion 5 5  Ankle plantarflexion    Ankle inversion    Ankle eversion     (Blank rows = not tested)  LOWER EXTREMITY SPECIAL TESTS:    FUNCTIONAL TESTS:    GAIT: Distance walked: in clinic distances  Assistive device utilized: None Level of assistance: Complete Independence Comments: in brace, antalgic gait pattern, clear offloading surgical LE     TODAY'S TREATMENT: 12/09/2021: Therex: Nustep lvl 6 9 mins UE/LE Seated Lt leg quad set 5 sec hold x  10 Attempted seated SLR (unable to maintain due to weakness) Seated Lt leg LAQ to available range 2 x 10 c slow movement control focus. Seated isometric 5 sec alternating extension/flexion Lt knee in 75 deg flexion 3 mins total  HEP review and printout provided.   Neuro Re-ed [facility] pew anterior/posterior weight shift 2 mins (cues throughout) Tandem stance 1 min x1 bilateral  12/01/2021 Quad sets 1x15 3 second holds  Attempted SAQs but unable due to quad weakness Bridges x10 Standing hip ABD green TB 1x10     PATIENT EDUCATION:  12/09/2021 Education details: HEP updates Person educated: Patient Education method: Explanation, Demonstration, and Handouts Education comprehension: verbalized understanding and returned demonstration   HOME EXERCISE PROGRAM: Access Code: [email or web address] URL: [email or web address] Date: 12/09/2021 Prepared by: Scot Jun  Exercises - Supine Bridge  - 2 x daily - 7 x weekly - 1 sets - 10 reps - 3 hold - Standing Hip Abduction with Resistance at Thighs  - 2 x daily - 7 x weekly - 1 sets - 10 reps - 3 hold - Seated Knee Flexion Extension AAROM with Overpressure  - 1 x daily - 7 x weekly - 3 sets - 10 reps - Seated Quad Set  - 3-5 x daily - 7 x weekly - 1 sets - 10 reps - 5 hold - Seated Long Arc Quad (Mirrored)  - 3-5 x daily - 7 x weekly - 1-2 sets - 10 reps - 2 hold   ASSESSMENT:  CLINICAL IMPRESSION: Continued evidence and difficulty c quad activation, particularly in end range at this time.  Poor recall of HEP at this time, but printout provided again.  Continued skilled PT services indicated at this time to progress function.    OBJECTIVE IMPAIRMENTS Abnormal gait, decreased knowledge of condition, decreased mobility, difficulty walking, decreased strength, impaired flexibility, and pain.   ACTIVITY LIMITATIONS sitting, standing, squatting, stairs, transfers, and locomotion level  PARTICIPATION LIMITATIONS: driving, community  activity, and yard work  PERSONAL FACTORS Age, Behavior pattern, Past/current experiences, and Time since onset of injury/illness/exacerbation are also affecting patient's functional outcome.   REHAB POTENTIAL: Good  CLINICAL DECISION MAKING: Stable/uncomplicated  EVALUATION COMPLEXITY: Low   GOALS: Goals reviewed with patient? Yes  SHORT TERM GOALS: Target date: 12/29/2021  Will  be compliant with appropriate progressive HEP  Baseline: Goal status: on going - assessed 12/09/2021  2.  Will be able to demonstrate improved quad strength as evidenced by ability to perform SLR without quad lag  Baseline:  Goal status: on going - assessed 12/09/2021  3.  Will be able to gait train distances of at least 389f without limp or knee buckling during stance phase  Baseline:  Goal status: on going - assessed 12/09/2021   LONG TERM GOALS: Target date: 01/26/2022   L quad MMT to be at least 4/5 in order to improve gait pattern and reduce compensations with gait  Baseline:  Goal status: INITIAL  2.  Will be able to ascend/descend full flight of steps with U rail and reciprocal pattern, good eccentric control L LE with descent  Baseline:  Goal status: INITIAL  3.  Will be able to maintain single leg stance for at least 15 seconds L LE without knee buckling  Baseline:  Goal status: INITIAL  4.  Will be compliant with appropriate gym program in order to maintain functional gains and prevent recurrence of pain/injury  Baseline:  Goal status: INITIAL    PLAN: PT FREQUENCY: 2x/week  PT DURATION: 8 weeks  PLANNED INTERVENTIONS: Therapeutic exercises, Therapeutic activity, Neuromuscular re-education, Balance training, Gait training, Patient/Family education, Self Care, Joint mobilization, Manual therapy, and Re-evaluation, Vasopneumatic, Ionto   PLAN FOR NEXT SESSION:   Progressive quad strengthening, mobility gains (manual/therex), improve ambulation stability in static balance.     MScot Jun PT, DPT, OCS, ATC 12/09/21  3:50 PM       [employer] information from evaluation: Referring diagnosis? SD63.875I(ICD-10-CM) - Traumatic rupture of left quadriceps tendon, subsequent encounter  Treatment diagnosis? (if different than referring diagnosis) Acute pain of L knee M25.562, Muscle weakness M62.81, Other signs and symptoms involving the musculoskeletal system R29.898 What was this (referring dx) caused by? '[x]'$  Surgery '[x]'$  Fall '[]'$  Ongoing issue '[]'$  Arthritis '[]'$  Other: ____________  Laterality: '[]'$  Rt '[x]'$  Lt '[]'$  Both  Check all possible CPT codes:  *CHOOSE 10 OR LESS*    '[]'$  97110 (Therapeutic Exercise)  '[]'$  92507 (SLP Treatment)  '[]'$  97112 (Neuro Re-ed)   '[]'$  92526 (Swallowing Treatment)   '[]'$  97116 (Gait Training)   '[]'$  [id number](Cognitive Training, 1st 15 minutes) '[]'$  97140 (Manual Therapy)   '[]'$  97130 (Cognitive Training, each add'l 15 minutes)  '[]'$  97164 (Re-evaluation)                              '[]'$  Other, List CPT Code ____________  '[]'$  97530 (Therapeutic Activities)     '[]'$  97535 (Self Care)   '[x]'$  All codes above (97110 - 97535)  '[]'$  97012 (Mechanical Traction)  '[]'$  97014 (E-stim Unattended)  '[]'$  97032 (E-stim manual)  '[x]'$  97033 (Ionto)  '[]'$  97035 (Ultrasound) '[]'$  97750 (Physical Performance Training) '[]'$  [id number](Aquatic Therapy) '[x]'$  97016 (Vasopneumatic Device) '[]'$  [id number](Paraffin) '[]'$  97034 (Contrast Bath) '[]'$  97597 (Wound Care 1st 20 sq cm) '[]'$  97598 (Wound Care each add'l 20 sq cm) '[]'$  97760 (Orthotic Fabrication, Fitting, Training Initial) '[]'$  [id number](Prosthetic Management and Training Initial) '[]'$  [id number](Orthotic or Prosthetic Training/ Modification Subsequent)

## 2021-12-09 ENCOUNTER — Encounter: Payer: Self-pay | Admitting: Family Medicine

## 2021-12-09 ENCOUNTER — Ambulatory Visit (INDEPENDENT_AMBULATORY_CARE_PROVIDER_SITE_OTHER): Payer: Federal, State, Local not specified - PPO | Admitting: Rehabilitative and Restorative Service Providers"

## 2021-12-09 ENCOUNTER — Ambulatory Visit: Payer: Federal, State, Local not specified - PPO | Admitting: Family Medicine

## 2021-12-09 ENCOUNTER — Encounter: Payer: Self-pay | Admitting: Rehabilitative and Restorative Service Providers"

## 2021-12-09 DIAGNOSIS — R310 Gross hematuria: Secondary | ICD-10-CM | POA: Diagnosis not present

## 2021-12-09 DIAGNOSIS — M25562 Pain in left knee: Secondary | ICD-10-CM

## 2021-12-09 DIAGNOSIS — M6281 Muscle weakness (generalized): Secondary | ICD-10-CM

## 2021-12-09 DIAGNOSIS — R3912 Poor urinary stream: Secondary | ICD-10-CM | POA: Diagnosis not present

## 2021-12-09 DIAGNOSIS — R29898 Other symptoms and signs involving the musculoskeletal system: Secondary | ICD-10-CM | POA: Diagnosis not present

## 2021-12-09 DIAGNOSIS — B9689 Other specified bacterial agents as the cause of diseases classified elsewhere: Secondary | ICD-10-CM | POA: Diagnosis not present

## 2021-12-09 DIAGNOSIS — R350 Frequency of micturition: Secondary | ICD-10-CM | POA: Diagnosis not present

## 2021-12-09 DIAGNOSIS — R300 Dysuria: Secondary | ICD-10-CM | POA: Diagnosis not present

## 2021-12-09 DIAGNOSIS — N390 Urinary tract infection, site not specified: Secondary | ICD-10-CM | POA: Diagnosis not present

## 2021-12-10 ENCOUNTER — Ambulatory Visit: Payer: Federal, State, Local not specified - PPO | Admitting: Family Medicine

## 2021-12-10 ENCOUNTER — Ambulatory Visit (INDEPENDENT_AMBULATORY_CARE_PROVIDER_SITE_OTHER): Payer: Federal, State, Local not specified - PPO | Admitting: Family Medicine

## 2021-12-10 ENCOUNTER — Telehealth: Payer: Self-pay | Admitting: Pharmacist

## 2021-12-10 ENCOUNTER — Ambulatory Visit: Payer: Federal, State, Local not specified - PPO | Admitting: Physician Assistant

## 2021-12-10 ENCOUNTER — Encounter: Payer: Self-pay | Admitting: Family Medicine

## 2021-12-10 VITALS — BP 146/80 | HR 85 | Temp 97.6°F | Ht 69.0 in | Wt 238.8 lb

## 2021-12-10 DIAGNOSIS — E1159 Type 2 diabetes mellitus with other circulatory complications: Secondary | ICD-10-CM

## 2021-12-10 DIAGNOSIS — N401 Enlarged prostate with lower urinary tract symptoms: Secondary | ICD-10-CM

## 2021-12-10 DIAGNOSIS — E785 Hyperlipidemia, unspecified: Secondary | ICD-10-CM

## 2021-12-10 DIAGNOSIS — I10 Essential (primary) hypertension: Secondary | ICD-10-CM | POA: Diagnosis not present

## 2021-12-10 DIAGNOSIS — N3941 Urge incontinence: Secondary | ICD-10-CM | POA: Diagnosis not present

## 2021-12-10 DIAGNOSIS — Z8546 Personal history of malignant neoplasm of prostate: Secondary | ICD-10-CM | POA: Insufficient documentation

## 2021-12-10 DIAGNOSIS — N138 Other obstructive and reflux uropathy: Secondary | ICD-10-CM

## 2021-12-10 DIAGNOSIS — I25119 Atherosclerotic heart disease of native coronary artery with unspecified angina pectoris: Secondary | ICD-10-CM

## 2021-12-10 LAB — HEMOGLOBIN A1C: Hgb A1c MFr Bld: 7 % — ABNORMAL HIGH (ref 4.6–6.5)

## 2021-12-10 LAB — GLUCOSE, RANDOM: Glucose, Bld: 82 mg/dL (ref 70–99)

## 2021-12-10 NOTE — Progress Notes (Signed)
[facility] [address] [place] [address] Dept: [phone] Dept Fax: [phone]  Transfer of Care Office Visit  Subjective:    Patient ID: [name], male    DOB: Feb 14, 1951, 71 y.o..   MRN: [id number]  Chief Complaint  Patient presents with   Establish Care    NP- establish care. No concerns.   Fasting today.     History of Present Illness:  Patient is in today to establish care. [name] was born in [place], [place]. His family moved to [place] when he was young. he attended high school at [employer]. 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 He had two children (now in their 10s and 25s) with his first wife and one child (81) with his current wife of 75 years. He has three grandchildren. He denies any tobacco or drug use and only rarely drinks alcohol.  [name] has a history of Type 2 diabetes. he is managed on metformin 1,000 mg bid, and glipizide 5 mg bid. He has been seen Dr. Cruzita Lederer (endocrinology) for this.  [name] has a history of coronary artery disease and is s/p NSTEMI with placement of two stents. He is managed on a daily aspirin, metoprolol 50 mg bid and keeps nitroglycerin on hand. He sees dr. Tamala Julian (cardiology) to monitor this.  [name] has a history of hypertension. He is managed on losartan 100 mg daily and HCTZ 25 mg daily.  [name] has a history of hyperlipidemia. He developed myopathy with statins and did not tolerate Repatha. He currently is managing this through dietary changes and exercise.  [name] has a history of prostate cancer. He underwent radiation treatment for this 4 years ago. More recently, he has had some issues with urge incontinence. He was prescribed vibegron Logan Bores), but has not found that helpful, so is not taking this any longer. He also had a recent issue with hematuria. He initially was seen int he ED and saw Dr.  Alinda Money (urology) yesterday. He was prescribed a course of cephalexin, but has not completed this. Dr. Alinda Money ahs ordered some additional tests.  Past Medical History: Patient Active Problem List   Diagnosis Date Noted   History of prostate cancer 12/10/2021   Urge incontinence 12/10/2021   Traumatic rupture of left quadriceps tendon 09/30/2021   Acute non-ST elevation myocardial infarction (NSTEMI) ([place]) 05/04/2019   Statin myopathy 04/04/2019   Rupture of right supraspinatus tendon 01/18/2019   Complex tear of medial meniscus of left knee 09/14/2018   Refusal of blood transfusions as patient is Jehovah's Witness    Osteoarthritis    GERD (gastroesophageal reflux disease)    Depression    Chronic lower back pain    Anemia    Essential hypertension 01/03/2017   PAT (paroxysmal atrial tachycardia) ([place]) 10/27/2015   Type 2 diabetes mellitus with circulatory disorder, without long-term current use of insulin ([place]) 06/19/2015   Angina pectoris ([place])    CAD (coronary artery disease)    Obstructive sleep apnea 09/06/2014   Prostatitis, acute 12/13/2013   Insomnia 04/19/2013   Obesity with body mass index of 30.0-39.9 10/18/2012   Right lumbar radiculitis 09/29/2011   Adenomatous colon polyp 04/05/2009   Erectile dysfunction 11/11/2008   Hyperlipidemia LDL goal <70 08/23/2008   Benign prostatic hyperplasia with urinary obstruction 08/23/2008   Past Surgical History:  Procedure Laterality Date   CARDIAC CATHETERIZATION  04/05/2002  patent coronary arteries   CARDIAC CATHETERIZATION  05/01/2015   "tried to put stent in but couldn't"   CARDIAC CATHETERIZATION N/A 05/01/2015   Procedure: Left Heart Cath and Coronary Angiography;  Surgeon: Belva Crome, MD; LAD 60%, oD1 90%, pD1 70%, D2 70%, CFX patent stent, 30% distal to prev stent, pRCA 20%, OM1 90/95%, NL LV   CARDIAC CATHETERIZATION N/A 05/01/2015   Procedure: Coronary Stent Intervention;  Surgeon: Belva Crome, MD;   Unsuccessful PCI OM due to tortuosity   CARDIAC CATHETERIZATION N/A 11/06/2014   Procedure: Left Heart Cath and Coronary Angiography;  Surgeon: Belva Crome, MD;  Location: [place] [facility];  Service: Cardiovascular;  Laterality: N/A;   CLOSED REDUCTION SHOULDER DISLOCATION Right ~ 1975   "& reattached muscle"   COLONOSCOPY W/ BIOPSIES AND POLYPECTOMY  X 2   ESOPHAGOGASTRODUODENOSCOPY ENDOSCOPY     KNEE ARTHROSCOPY WITH MEDIAL MENISECTOMY Left 09/14/2018   Procedure: LEFT KNEE ARTHROSCOPY CHONDROPLASY,  WITH MEDIAL MENISECTOMY;  Surgeon: Marchia Bond, MD;  Location: [place];  Service: Orthopedics;  Laterality: Left;   KNEE SURGERY Left 11/17/2021   PILONIDAL CYST EXCISION     SHOULDER ARTHROSCOPY WITH DEBRIDEMENT AND BICEP TENDON REPAIR Right 01/18/2019   Procedure: RIGHT SHOULDER ARTHROSCOPY WITH DEBRIDEMENT AND DECOMPRSSION SUBACROMIAL PARTIAL ACROMIOPLASTY;  Surgeon: Marchia Bond, MD;  Location: [place];  Service: Orthopedics;  Laterality: Right;   SHOULDER ARTHROSCOPY WITH ROTATOR CUFF REPAIR Right 01/18/2019   Procedure: SHOULDER ARTHROSCOPY WITH ROTATOR CUFF REPAIR;  Surgeon: Marchia Bond, MD;  Location: [place];  Service: Orthopedics;  Laterality: Right;   Family History  Problem Relation Age of Onset   Coronary artery disease Other        CABG   Coronary artery disease Mother    Stroke Mother    Heart disease Mother    Diabetes Mother    Other Father    Diabetes Sister    Diabetes Maternal Uncle        x2   Alcohol abuse Other    Diabetes Paternal Uncle        x2   Colon cancer Paternal Uncle    Colon polyps Neg Hx    Esophageal cancer Neg Hx    Outpatient Medications Prior to Visit  Medication Sig Dispense Refill   albuterol (VENTOLIN HFA) 108 (90 Base) MCG/ACT inhaler INHALE 1-2 PUFFS INTO THE LUNGS EVERY 4 HOURS AS NEEDED FOR WHEEZING OR SHORTNESS OF BREATH. 18 each 2   aspirin EC 81 MG tablet Take 81 mg  by mouth daily. Swallow whole.     Blood Glucose Monitoring Suppl (RELION PREMIER BLU MONITOR) DEVI 1 Device by Does not apply route daily. 1 Device 0   glipiZIDE (GLUCOTROL XL) 5 MG 24 hr tablet TAKE 1 TABLET BY MOUTH TWICE A DAY 180 tablet 3   glucose blood test strip Use as instructed to check sugar 2 times daily 200 each 5   hydrochlorothiazide (HYDRODIURIL) 25 MG tablet TAKE 1 TABLET BY MOUTH EVERY DAY 90 tablet 3   HYDROcodone-acetaminophen (NORCO) 5-325 MG tablet Take 1 tablet by mouth 2 (two) times daily as needed. 20 tablet 0   losartan (COZAAR) 100 MG tablet TAKE 1 TABLET BY MOUTH EVERY DAY 90 tablet 3   metFORMIN (GLUCOPHAGE) 500 MG tablet TAKE 2 TABLETS (1,000 MG TOTAL) BY MOUTH 2 (TWO) TIMES DAILY WITH A MEAL. 360 tablet 3   methocarbamol (ROBAXIN-750) 750 MG tablet Take 1 tablet (750  mg total) by mouth 2 (two) times daily as needed for muscle spasms. 20 tablet 2   metoprolol tartrate (LOPRESSOR) 50 MG tablet TAKE 1 TABLET BY MOUTH TWICE A DAY 180 tablet 1   nitroGLYCERIN (NITROSTAT) 0.4 MG SL tablet PLACE 1 TABLET UNDER THE TONGUE EVERY 5 MIN X 3 DOSES AS NEEDED FOR CHEST PAIN 75 tablet 2   acetaminophen (TYLENOL) 325 MG tablet Take 2 tablets (650 mg total) by mouth every 6 (six) hours as needed for moderate pain. 30 tablet 0   Vibegron (GEMTESA) 75 MG TABS Take 75 mg by mouth daily. 28 tablet 0   cephALEXin (KEFLEX) 500 MG capsule Take 1 capsule (500 mg total) by mouth 4 (four) times daily for 10 days. 40 capsule 0   cetirizine (ZYRTEC ALLERGY) 10 MG tablet Take 1 tablet (10 mg total) by mouth daily. 30 tablet 0   fluticasone (FLONASE) 50 MCG/ACT nasal spray Place 1-2 sprays into both nostrils daily. 48 mL 2   hydrALAZINE (APRESOLINE) 50 MG tablet Take 1 tablet (50 mg total) by mouth 3 (three) times daily. 270 tablet 3   promethazine-dextromethorphan (PROMETHAZINE-DM) 6.25-15 MG/5ML syrup Take 5 mLs by mouth 4 (four) times daily as needed for cough. 60 mL 0   Semaglutide,0.25 or  0.'5MG'$ /DOS, 2 MG/3ML SOPN Inject 0.5 mg into the skin once a week. 3 mL 3   sertraline (ZOLOFT) 25 MG tablet TAKE 1 TABLET (25 MG TOTAL) BY MOUTH DAILY. 90 tablet 1   sildenafil (REVATIO) 20 MG tablet Take 3-5 tablets 1hr prior to intercourse 60 tablet 3   No facility-administered medications prior to visit.   Allergies  Allergen Reactions   Statins Other (See Comments)    REACTION: joint pain Lipitor- headaches Has also tried Livalo, pravachol, zetia, crestor, welchol   Imdur [Isosorbide Dinitrate]     Headache - "violent"   Repatha [Evolocumab]     Muscle weakness and soreness     Objective:   Today's Vitals   12/10/21 1012  BP: (!) 146/80  Pulse: 85  Temp: 97.6 F (36.4 C)  TempSrc: Temporal  SpO2: 95%  Weight: 238 lb 12.8 oz (108.3 kg)  Height: '5\' 9"'$  (1.753 m)   Body mass index is 35.26 kg/m.   General: Well developed, well nourished. No acute distress. Psych: Alert and oriented. Normal mood and affect.  Health Maintenance Due  Topic Date Due   Zoster Vaccines- Shingrix (1 of 2) Never done   INFLUENZA VACCINE  11/03/2021     Lab Results: Lab Results  Component Value Date   HGBA1C 7.0 (A) 08/06/2021   Lab Results  Component Value Date   CHOL 179 06/04/2021   HDL 41.30 06/04/2021   LDLCALC 126 (H) 06/04/2021   LDLDIRECT 131 (H) 03/22/2019   TRIG 59.0 06/04/2021   CHOLHDL 4 06/04/2021   Assessment & Plan:   1. Type 2 diabetes mellitus with other circulatory complication, without long-term current use of insulin [facility]) Mr. Patras is doing well with his diabetes. I did advise him that I could manage this for him for the time being. This would cut down on his need to see endocrinology unless some problem developed. For now I will have him continue his metformin 1,000 mg bid and glipizide 5 mg bid. We will recheck his A1c today.  - Glucose, random - Hemoglobin A1c  2. Coronary artery disease involving native coronary artery of native heart with angina  pectoris (HCC) Stable. Continue aspirin 81 mg daily and metoprolol  50 mg bid.  3. Essential hypertension Blood pressure is mildly high today. We will follow this and adjust his BP meds if needed. He will continue losartan 100 mg daily and HCTZ 25 mg daily.  4. Benign prostatic hyperplasia with urinary obstruction Urge Mr. Wing to keep appointments with Dr,. Borden for evaluation of this and his recent hematuria.  5. Mixed hyperlipidemia Unfortunately, Mr. Weddington has been intolerant of many lipid lowering options. We will continue to follow this.   Return in about 3 months (around 03/11/2022) for Reassessment.   Haydee Salter, MD

## 2021-12-10 NOTE — Progress Notes (Signed)
    Chronic Care Management Pharmacy Assistant   Name: [name]  MRN: [id number] DOB: 07/08/1950   Reason for Encounter: Follow up Call     Medications: Outpatient Encounter Medications as of 12/10/2021  Medication Sig Note   acetaminophen (TYLENOL) 325 MG tablet Take 2 tablets (650 mg total) by mouth every 6 (six) hours as needed for moderate pain.    albuterol (VENTOLIN HFA) 108 (90 Base) MCG/ACT inhaler INHALE 1-2 PUFFS INTO THE LUNGS EVERY 4 HOURS AS NEEDED FOR WHEEZING OR SHORTNESS OF BREATH.    aspirin EC 81 MG tablet Take 81 mg by mouth daily. Swallow whole.    Blood Glucose Monitoring Suppl (RELION PREMIER BLU MONITOR) DEVI 1 Device by Does not apply route daily.    cephALEXin (KEFLEX) 500 MG capsule Take 1 capsule (500 mg total) by mouth 4 (four) times daily for 10 days.    cetirizine (ZYRTEC ALLERGY) 10 MG tablet Take 1 tablet (10 mg total) by mouth daily. 09/29/2021: Patient take 10 mg once daily PRN.    fluticasone (FLONASE) 50 MCG/ACT nasal spray Place 1-2 sprays into both nostrils daily. 09/29/2021: Uses PRN   glipiZIDE (GLUCOTROL XL) 5 MG 24 hr tablet TAKE 1 TABLET BY MOUTH TWICE A DAY    glucose blood test strip Use as instructed to check sugar 2 times daily    hydrALAZINE (APRESOLINE) 50 MG tablet Take 1 tablet (50 mg total) by mouth 3 (three) times daily. 09/29/2021: Patient taking differently: takes 50 mg once daily. He reports not being of prescription directions.    hydrochlorothiazide (HYDRODIURIL) 25 MG tablet TAKE 1 TABLET BY MOUTH EVERY DAY    HYDROcodone-acetaminophen (NORCO) 5-325 MG tablet Take 1 tablet by mouth 2 (two) times daily as needed.    losartan (COZAAR) 100 MG tablet TAKE 1 TABLET BY MOUTH EVERY DAY    metFORMIN (GLUCOPHAGE) 500 MG tablet TAKE 2 TABLETS (1,000 MG TOTAL) BY MOUTH 2 (TWO) TIMES DAILY WITH A MEAL.    methocarbamol (ROBAXIN-750) 750 MG tablet Take 1 tablet (750 mg total) by mouth 2 (two) times daily as needed for muscle spasms.     metoprolol tartrate (LOPRESSOR) 50 MG tablet TAKE 1 TABLET BY MOUTH TWICE A DAY    nitroGLYCERIN (NITROSTAT) 0.4 MG SL tablet PLACE 1 TABLET UNDER THE TONGUE EVERY 5 MIN X 3 DOSES AS NEEDED FOR CHEST PAIN    promethazine-dextromethorphan (PROMETHAZINE-DM) 6.25-15 MG/5ML syrup Take 5 mLs by mouth 4 (four) times daily as needed for cough.    Semaglutide,0.25 or 0.'5MG'$ /DOS, 2 MG/3ML SOPN Inject 0.5 mg into the skin once a week.    sertraline (ZOLOFT) 25 MG tablet TAKE 1 TABLET (25 MG TOTAL) BY MOUTH DAILY.    sildenafil (REVATIO) 20 MG tablet Take 3-5 tablets 1hr prior to intercourse    Vibegron (GEMTESA) 75 MG TABS Take 75 mg by mouth daily.    No facility-administered encounter medications on file as of 12/10/2021.    Called patient to follow up with him post knee surgery. Patient was currently in another doctors appointment and he would call me back later today.   Jobe Gibbon, [facility] Clinical Pharmacist Assistant  [phone]

## 2021-12-15 ENCOUNTER — Encounter: Payer: Self-pay | Admitting: Rehabilitative and Restorative Service Providers"

## 2021-12-15 ENCOUNTER — Ambulatory Visit (INDEPENDENT_AMBULATORY_CARE_PROVIDER_SITE_OTHER): Payer: Federal, State, Local not specified - PPO | Admitting: Rehabilitative and Restorative Service Providers"

## 2021-12-15 DIAGNOSIS — R29898 Other symptoms and signs involving the musculoskeletal system: Secondary | ICD-10-CM

## 2021-12-15 DIAGNOSIS — M6281 Muscle weakness (generalized): Secondary | ICD-10-CM

## 2021-12-15 DIAGNOSIS — M25562 Pain in left knee: Secondary | ICD-10-CM

## 2021-12-15 NOTE — Therapy (Addendum)
OUTPATIENT PHYSICAL THERAPY TREATMENT /DISCHARGE   Patient Name: [name] MRN: [id number] DOB:[birth year], 71 y.o., male Today's Date: 12/15/2021  END OF SESSION:   PT End of Session - 12/15/21 1400     Visit Number 3    Number of Visits 17    Date for PT Re-Evaluation 01/26/22    Authorization Type [employer] and [employer]    Authorization Time Period 12/01/21 to 03/03/2022    Authorization - Visit Number 3    Authorization - Number of Visits 12    PT Start Time 2202    PT Stop Time 1427    PT Time Calculation (min) 40 min    Activity Tolerance Patient tolerated treatment well    Behavior During Therapy [facility] for tasks assessed/performed               Past Medical History:  Diagnosis Date   Adenomatous colon polyp 2011   Anemia    Angina pectoris ([place]) 05/01/2015   med rx for 95% OM (u/a to access due to tortuosity), 95% D1 and other moderate CAD at cath   Arthritis    "back, knees" (05/01/2015)   Chest pain    pleuritic   Chronic lower back pain    Complex tear of medial meniscus of left knee 09/14/2018   Coronary artery disease    Depression    GERD (gastroesophageal reflux disease)    HTN (hypertension)    Hyperlipidemia    Obstructive sleep apnea    noncompliant with CPAP   Osteoarthritis    Pneumonia ~ 2014 X 1   Prostate cancer ([place])    Refusal of blood transfusions as patient is Jehovah's Witness    Rotator cuff arthropathy, right    Rupture of right supraspinatus tendon 01/18/2019   Type II or unspecified type diabetes mellitus without mention of complication, not stated as uncontrolled    Past Surgical History:  Procedure Laterality Date   CARDIAC CATHETERIZATION  04/05/2002   patent coronary arteries   CARDIAC CATHETERIZATION  05/01/2015   "tried to put stent in but couldn't"   CARDIAC CATHETERIZATION N/A 05/01/2015   Procedure: Left Heart Cath and Coronary Angiography;  Surgeon: Belva Crome, MD; LAD 60%, oD1 90%, pD1 70%, D2 70%, CFX patent  stent, 30% distal to prev stent, pRCA 20%, OM1 90/95%, NL LV   CARDIAC CATHETERIZATION N/A 05/01/2015   Procedure: Coronary Stent Intervention;  Surgeon: Belva Crome, MD;  Unsuccessful PCI OM due to tortuosity   CARDIAC CATHETERIZATION N/A 11/06/2014   Procedure: Left Heart Cath and Coronary Angiography;  Surgeon: Belva Crome, MD;  Location: [place] [facility];  Service: Cardiovascular;  Laterality: N/A;   CLOSED REDUCTION SHOULDER DISLOCATION Right ~ 1975   "& reattached muscle"   COLONOSCOPY W/ BIOPSIES AND POLYPECTOMY  X 2   ESOPHAGOGASTRODUODENOSCOPY ENDOSCOPY     KNEE ARTHROSCOPY WITH MEDIAL MENISECTOMY Left 09/14/2018   Procedure: LEFT KNEE ARTHROSCOPY CHONDROPLASY,  WITH MEDIAL MENISECTOMY;  Surgeon: Marchia Bond, MD;  Location: [place];  Service: Orthopedics;  Laterality: Left;   KNEE SURGERY Left 11/17/2021   PILONIDAL CYST EXCISION     SHOULDER ARTHROSCOPY WITH DEBRIDEMENT AND BICEP TENDON REPAIR Right 01/18/2019   Procedure: RIGHT SHOULDER ARTHROSCOPY WITH DEBRIDEMENT AND DECOMPRSSION SUBACROMIAL PARTIAL ACROMIOPLASTY;  Surgeon: Marchia Bond, MD;  Location: [place];  Service: Orthopedics;  Laterality: Right;   SHOULDER ARTHROSCOPY WITH ROTATOR CUFF REPAIR Right 01/18/2019   Procedure: SHOULDER ARTHROSCOPY WITH ROTATOR CUFF  REPAIR;  Surgeon: Marchia Bond, MD;  Location: [place];  Service: Orthopedics;  Laterality: Right;   Patient Active Problem List   Diagnosis Date Noted   History of prostate cancer 12/10/2021   Urge incontinence 12/10/2021   Traumatic rupture of left quadriceps tendon 09/30/2021   Acute non-ST elevation myocardial infarction (NSTEMI) ([place]) 05/04/2019   Statin myopathy 04/04/2019   Rupture of right supraspinatus tendon 01/18/2019   Complex tear of medial meniscus of left knee 09/14/2018   Refusal of blood transfusions as patient is Jehovah's Witness    Osteoarthritis    GERD (gastroesophageal  reflux disease)    Depression    Chronic lower back pain    Anemia    Essential hypertension 01/03/2017   PAT (paroxysmal atrial tachycardia) ([place]) 10/27/2015   Type 2 diabetes mellitus with circulatory disorder, without long-term current use of insulin ([place]) 06/19/2015   Angina pectoris ([place])    CAD (coronary artery disease)    Obstructive sleep apnea 09/06/2014   Prostatitis, acute 12/13/2013   Insomnia 04/19/2013   Obesity with body mass index of 30.0-39.9 10/18/2012   Right lumbar radiculitis 09/29/2011   Adenomatous colon polyp 04/05/2009   Erectile dysfunction 11/11/2008   Hyperlipidemia LDL goal <70 08/23/2008   Benign prostatic hyperplasia with urinary obstruction 08/23/2008    PCP: [name]   REFERRING PROVIDER: Aundra Dubin, PA-C   REFERRING DIAG: [phone] (ICD-10-CM) - Traumatic rupture of left quadriceps tendon, subsequent encounter   THERAPY DIAG:  Acute pain of left knee  Muscle weakness (generalized)  Other symptoms and signs involving the musculoskeletal system  Rationale for Evaluation and Treatment Rehabilitation  ONSET DATE: 11/03/2021   SUBJECTIVE:   SUBJECTIVE STATEMENT: Pt indicated no pain upon arrival today.  Didn't indicate any particular symptoms or reporting since last visit, even with questioning.   PERTINENT HISTORY:  PAIN:  NPRS scale: 0/10 Pain location: left leg  Pain description: unclear  Aggravating factors: working out, walking  Relieving factors: sitting down, rest   PRECAUTIONS: Other: quad repair protocol  (see protocol guidelines in cabinet based off time since surgery)  WEIGHT BEARING RESTRICTIONS No  FALLS:  Has patient fallen in last 6 months? Yes. Number of falls 2-3, 2 falls on the bike; no fear of falling   LIVING ENVIRONMENT: Lives with: lives with their spouse Lives in: House/apartment Stairs: 5-6 STE U rail, no steps inside  Has following equipment at home: None  OCCUPATION: retired   PLOF:  Independent, Independent with basic ADLs, Independent with gait, and Independent with transfers  PATIENT GOALS ride bike again    OBJECTIVE:   DIAGNOSTIC FINDINGS: X-rays demonstrate advanced degenerative changes the medial compartment.   Moderate degenerative changes to the patellofemoral compartment   PATIENT SURVEYS:  12/09/2021: FOTO: 31.05, goal 52  COGNITION:  Overall cognitive status: Within functional limits for tasks assessed     SENSATION:   EDEMA:    MUSCLE LENGTH:  POSTURE:   PALPATION:   LOWER EXTREMITY ROM:  Active ROM Right eval Left eval  Hip flexion    Hip extension    Hip abduction    Hip adduction    Hip internal rotation    Hip external rotation    Knee flexion 114 114  Knee extension 6 5  Ankle dorsiflexion    Ankle plantarflexion    Ankle inversion    Ankle eversion     (Blank rows = not tested)  LOWER EXTREMITY MMT:  MMT Right eval Left  eval Left 12/15/2021  Hip flexion 4+ 3+   Hip extension     Hip abduction 5 5   Hip adduction     Hip internal rotation     Hip external rotation     Knee flexion 4 2+   Knee extension 5 2/5 (no MMT at eval, unable to fully extend against gravity) 2+/5 (unable to perform full range against gravity)  Ankle dorsiflexion 5 5   Ankle plantarflexion     Ankle inversion     Ankle eversion      (Blank rows = not tested)  LOWER EXTREMITY SPECIAL TESTS:    FUNCTIONAL TESTS:    GAIT: Distance walked: in clinic distances  Assistive device utilized: None Level of assistance: Complete Independence Comments: in brace, antalgic gait pattern, clear offloading surgical LE     TODAY'S TREATMENT: 12/15/2021: Therex: Recumbent bike lvl 3 8 mins Seated Lt quad set 5 sec x 10 (continued cues for intervention) Seated Lt leg LAQ to ability, then assist to 0 and then eccentric lowering focus 2 x 15  Supine SAQ 3 sec hold attempts bilateral x 10 (unable to lift Lt but attempts performed for muscle  contraction) Leg press Lt leg only 2 x 15 37 lbs c slow movement control focus Standing Green band TKE 5 sec hold x 15 Lt leg Sit to stand to sit 22 inch chair eccentric lowering focus no UE x 10 (cues for home)   12/09/2021: Therex: Nustep lvl 6 9 mins UE/LE Seated Lt leg quad set 5 sec hold x 10 Attempted seated SLR (unable to maintain due to weakness) Seated Lt leg LAQ to available range 2 x 10 c slow movement control focus. Seated isometric 5 sec alternating extension/flexion Lt knee in 75 deg flexion 3 mins total   HEP review and printout provided.   Neuro Re-ed [facility] pew anterior/posterior weight shift 2 mins (cues throughout) Tandem stance 1 min x1 bilateral  12/01/2021 Quad sets 1x15 3 second holds  Attempted SAQs but unable due to quad weakness Bridges x10 Standing hip ABD green TB 1x10     PATIENT EDUCATION:  12/09/2021 Education details: HEP updates Person educated: Patient Education method: Explanation, Demonstration, and Handouts Education comprehension: verbalized understanding and returned demonstration   HOME EXERCISE PROGRAM: Access Code: [email or web address] URL: [email or web address] Date: 12/09/2021 Prepared by: Scot Jun  Exercises - Supine Bridge  - 2 x daily - 7 x weekly - 1 sets - 10 reps - 3 hold - Standing Hip Abduction with Resistance at Thighs  - 2 x daily - 7 x weekly - 1 sets - 10 reps - 3 hold - Seated Knee Flexion Extension AAROM with Overpressure  - 1 x daily - 7 x weekly - 3 sets - 10 reps - Seated Quad Set  - 3-5 x daily - 7 x weekly - 1 sets - 10 reps - 5 hold - Seated Long Arc Quad (Mirrored)  - 3-5 x daily - 7 x weekly - 1-2 sets - 10 reps - 2 hold   ASSESSMENT:  CLINICAL IMPRESSION: Antalgic gait pattern secondary to quad weakness/control deficits noted despite independent ambulation in clinic presentation.  Lacking terminal active knee extension primary concern and focus of therapy intervention at this time.  Continued  cues for use of HEP and technique cues given.    OBJECTIVE IMPAIRMENTS Abnormal gait, decreased knowledge of condition, decreased mobility, difficulty walking, decreased strength, impaired flexibility, and pain.   ACTIVITY LIMITATIONS sitting, standing, squatting, stairs, transfers,  and locomotion level  PARTICIPATION LIMITATIONS: driving, community activity, and yard work  PERSONAL FACTORS Age, Behavior pattern, Past/current experiences, and Time since onset of injury/illness/exacerbation are also affecting patient's functional outcome.   REHAB POTENTIAL: Good  CLINICAL DECISION MAKING: Stable/uncomplicated  EVALUATION COMPLEXITY: Low   GOALS: Goals reviewed with patient? Yes  SHORT TERM GOALS: Target date: 12/29/2021  Will be compliant with appropriate progressive HEP  Baseline: Goal status: on going - assessed 12/09/2021  2.  Will be able to demonstrate improved quad strength as evidenced by ability to perform SLR without quad lag  Baseline:  Goal status: on going - assessed 12/09/2021  3.  Will be able to gait train distances of at least 31f without limp or knee buckling during stance phase  Baseline:  Goal status: on going - assessed 12/09/2021   LONG TERM GOALS: Target date: 01/26/2022   Lt quad MMT to be at least 4/5 in order to improve gait pattern and reduce compensations with gait  Baseline:  Goal status: on going - assessed 12/15/2021  2.  Will be able to ascend/descend full flight of steps with U rail and reciprocal pattern, good eccentric control L LE with descent  Baseline:  Goal status: on going - assessed 12/15/2021  3.  Will be able to maintain single leg stance for at least 15 seconds L LE without knee buckling  Baseline:  Goal status: on going - assessed 12/15/2021  4.  Will be compliant with appropriate gym program in order to maintain functional gains and prevent recurrence of pain/injury  Baseline:  Goal status: on going - assessed 12/15/2021    5.    Patient will demonstrate FOTO score > or = 52 5.      Goal Status new  PLAN: PT FREQUENCY: 2x/week  PT DURATION: 8 weeks  PLANNED INTERVENTIONS: Therapeutic exercises, Therapeutic activity, Neuromuscular re-education, Balance training, Gait training, Patient/Family education, Self Care, Joint mobilization, Manual therapy, and Re-evaluation, Vasopneumatic, Ionto   PLAN FOR NEXT SESSION:   Progressive quad strengthening, mobility gains (manual/therex), improve ambulation stability in static balance.    MScot Jun PT, DPT, OCS, ATC 12/15/21  2:28 PM  PHYSICAL THERAPY DISCHARGE SUMMARY  Visits from Start of Care: 3  Current functional level related to goals / functional outcomes: See note   Remaining deficits: See note   Education / Equipment: HEP  Patient goals were partially met. Patient is being discharged due to not returning since the last visit.   MScot Jun PT, DPT, OCS, ATC 02/12/22  10:56 AM          [employer] information from evaluation: Referring diagnosis? ST55.732K(ICD-10-CM) - Traumatic rupture of left quadriceps tendon, subsequent encounter  Treatment diagnosis? (if different than referring diagnosis) Acute pain of L knee M25.562, Muscle weakness M62.81, Other signs and symptoms involving the musculoskeletal system R29.898 What was this (referring dx) caused by? _0  Surgery _1  Fall _2  Ongoing issue _3  Arthritis _4  Other: ____________  Laterality: _5  Rt _6  Lt _7  Both  Check all possible CPT codes:  *CHOOSE 10 OR LESS*    _8  97110 (Therapeutic Exercise)  _9  92507 (SLP Treatment)  _10  902542(Neuro Re-ed)   _11  970623(Swallowing Treatment)   _12  976283(Gait Training)   _13  915176(Cognitive Training, 1st 15 minutes) _14  97140 (Manual Therapy)   _15  97130 (Cognitive Training, each add'l 15 minutes)  _16  97164 (Re-evaluation)                              _17   Other, List CPT Code ____________  _0  82641 (Therapeutic Activities)     _1  58309 (Self  Care)   _2  All codes above (97110 - 97535)  _3  40768 (Mechanical Traction)  _4  97014 (E-stim Unattended)  _5  97032 (E-stim manual)  _6  97033 (Ionto)  _7  08811 (Ultrasound) _8  97750 (Physical Performance Training) _9  03159 (Aquatic Therapy) _10  97016 (Vasopneumatic Device) _11  45859 (Paraffin) _12  29244 (Contrast Bath) _13  97597 (Wound Care 1st 20 sq cm) _14  97598 (Wound Care each add'l 20 sq cm) _15  97760 (Orthotic Fabrication, Fitting, Training Initial) _16  [id number] (Prosthetic Management and Training Initial) _17  [id number] (Orthotic or Prosthetic Training/ Modification Subsequent)

## 2021-12-17 ENCOUNTER — Ambulatory Visit: Payer: Federal, State, Local not specified - PPO

## 2021-12-17 ENCOUNTER — Telehealth: Payer: Self-pay | Admitting: Family Medicine

## 2021-12-17 ENCOUNTER — Encounter: Payer: Federal, State, Local not specified - PPO | Admitting: Rehabilitative and Restorative Service Providers"

## 2021-12-17 NOTE — Telephone Encounter (Signed)
Pt would like for DR Rudd to take over his diabetes care. He cancelled the other provider. He just wanted to let you know

## 2021-12-18 DIAGNOSIS — R310 Gross hematuria: Secondary | ICD-10-CM | POA: Diagnosis not present

## 2021-12-18 DIAGNOSIS — R319 Hematuria, unspecified: Secondary | ICD-10-CM | POA: Diagnosis not present

## 2021-12-21 ENCOUNTER — Telehealth: Payer: Self-pay | Admitting: Orthopaedic Surgery

## 2021-12-21 NOTE — Telephone Encounter (Signed)
Patient would like to get the copy of MRI that he gave Dr Erlinda Hong. The best number to reach patient [phone]

## 2021-12-21 NOTE — Telephone Encounter (Signed)
Spoke with patient. Left original disc up front for pick up.

## 2021-12-22 ENCOUNTER — Ambulatory Visit (INDEPENDENT_AMBULATORY_CARE_PROVIDER_SITE_OTHER): Payer: Federal, State, Local not specified - PPO

## 2021-12-22 ENCOUNTER — Encounter: Payer: Federal, State, Local not specified - PPO | Admitting: Rehabilitative and Restorative Service Providers"

## 2021-12-22 DIAGNOSIS — Z23 Encounter for immunization: Secondary | ICD-10-CM | POA: Diagnosis not present

## 2021-12-22 NOTE — Progress Notes (Signed)
..  After obtaining consent, and per orders of Dr. Gena Fray, injection of Shingles Vaccine given by Augustina Mood. Patient instructed to remain in clinic for 20 minutes afterwards, and to report any adverse reaction to me immediately.

## 2021-12-24 ENCOUNTER — Ambulatory Visit: Payer: Federal, State, Local not specified - PPO | Admitting: Internal Medicine

## 2021-12-25 ENCOUNTER — Other Ambulatory Visit: Payer: Self-pay

## 2021-12-25 DIAGNOSIS — S76112D Strain of left quadriceps muscle, fascia and tendon, subsequent encounter: Secondary | ICD-10-CM

## 2021-12-25 NOTE — Telephone Encounter (Signed)
Order has been placed.

## 2021-12-25 NOTE — Telephone Encounter (Signed)
Patient came in stating he would like is PT to be moved form here to  [place] at [facility] Please advise

## 2021-12-29 ENCOUNTER — Encounter: Payer: Federal, State, Local not specified - PPO | Admitting: Rehabilitative and Restorative Service Providers"

## 2021-12-30 DIAGNOSIS — C61 Malignant neoplasm of prostate: Secondary | ICD-10-CM | POA: Diagnosis not present

## 2021-12-30 DIAGNOSIS — R82998 Other abnormal findings in urine: Secondary | ICD-10-CM | POA: Diagnosis not present

## 2021-12-30 DIAGNOSIS — R310 Gross hematuria: Secondary | ICD-10-CM | POA: Diagnosis not present

## 2021-12-31 ENCOUNTER — Encounter: Payer: Federal, State, Local not specified - PPO | Admitting: Rehabilitative and Restorative Service Providers"

## 2022-01-01 ENCOUNTER — Other Ambulatory Visit: Payer: Self-pay | Admitting: Interventional Cardiology

## 2022-01-01 ENCOUNTER — Other Ambulatory Visit: Payer: Self-pay

## 2022-01-01 ENCOUNTER — Ambulatory Visit: Payer: Federal, State, Local not specified - PPO

## 2022-01-01 DIAGNOSIS — G4733 Obstructive sleep apnea (adult) (pediatric): Secondary | ICD-10-CM | POA: Diagnosis not present

## 2022-01-01 MED ORDER — METOPROLOL TARTRATE 50 MG PO TABS: 50.0000 mg | ORAL_TABLET | Freq: Two times a day (BID) | ORAL | 3 refills | Status: DC

## 2022-01-01 NOTE — Addendum Note (Signed)
Addended by: Mendel Ryder on: 01/01/2022 09:43 AM   Modules accepted: Orders

## 2022-01-04 ENCOUNTER — Encounter: Payer: Federal, State, Local not specified - PPO | Admitting: Rehabilitative and Restorative Service Providers"

## 2022-01-06 ENCOUNTER — Other Ambulatory Visit: Payer: Self-pay | Admitting: Physician Assistant

## 2022-01-06 ENCOUNTER — Telehealth: Payer: Self-pay | Admitting: Orthopaedic Surgery

## 2022-01-06 ENCOUNTER — Encounter: Payer: Federal, State, Local not specified - PPO | Admitting: Rehabilitative and Restorative Service Providers"

## 2022-01-06 MED ORDER — TIZANIDINE HCL 4 MG PO TABS: 4.0000 mg | ORAL_TABLET | Freq: Two times a day (BID) | ORAL | 0 refills | Status: DC | PRN

## 2022-01-06 NOTE — Telephone Encounter (Signed)
Pt wants meds tizanidind '4mg'$   send over to [facility] [address]

## 2022-01-06 NOTE — Telephone Encounter (Signed)
sent 

## 2022-01-11 ENCOUNTER — Other Ambulatory Visit: Payer: Self-pay

## 2022-01-11 ENCOUNTER — Ambulatory Visit: Payer: Federal, State, Local not specified - PPO | Attending: Family Medicine | Admitting: Physical Therapy

## 2022-01-11 ENCOUNTER — Encounter: Payer: Self-pay | Admitting: Physical Therapy

## 2022-01-11 DIAGNOSIS — S76112D Strain of left quadriceps muscle, fascia and tendon, subsequent encounter: Secondary | ICD-10-CM | POA: Diagnosis not present

## 2022-01-11 DIAGNOSIS — M25562 Pain in left knee: Secondary | ICD-10-CM | POA: Diagnosis not present

## 2022-01-11 DIAGNOSIS — M6281 Muscle weakness (generalized): Secondary | ICD-10-CM | POA: Diagnosis not present

## 2022-01-11 DIAGNOSIS — G8929 Other chronic pain: Secondary | ICD-10-CM | POA: Insufficient documentation

## 2022-01-11 NOTE — Therapy (Signed)
OUTPATIENT PHYSICAL THERAPY EVALUATION   Patient Name: [name] MRN: [id number] DOB:1950/12/09, 71 y.o., male Today's Date: 01/11/2022   PT End of Session - 01/11/22 1617     Visit Number 1    Number of Visits 17    Date for PT Re-Evaluation 03/08/22    Authorization Type [employer] / [employer]    PT Start Time 1615    PT Stop Time 1700    PT Time Calculation (min) 45 min    Activity Tolerance Patient tolerated treatment well    Behavior During Therapy [facility] for tasks assessed/performed             Past Medical History:  Diagnosis Date   Adenomatous colon polyp 2011   Anemia    Angina pectoris ([place]) 05/01/2015   med rx for 95% OM (u/a to access due to tortuosity), 95% D1 and other moderate CAD at cath   Arthritis    "back, knees" (05/01/2015)   Chest pain    pleuritic   Chronic lower back pain    Complex tear of medial meniscus of left knee 09/14/2018   Coronary artery disease    Depression    GERD (gastroesophageal reflux disease)    HTN (hypertension)    Hyperlipidemia    Obstructive sleep apnea    noncompliant with CPAP   Osteoarthritis    Pneumonia ~ 2014 X 1   Prostate cancer ([place])    Refusal of blood transfusions as patient is Jehovah's Witness    Rotator cuff arthropathy, right    Rupture of right supraspinatus tendon 01/18/2019   Type II or unspecified type diabetes mellitus without mention of complication, not stated as uncontrolled    Past Surgical History:  Procedure Laterality Date   CARDIAC CATHETERIZATION  04/05/2002   patent coronary arteries   CARDIAC CATHETERIZATION  05/01/2015   "tried to put stent in but couldn't"   CARDIAC CATHETERIZATION N/A 05/01/2015   Procedure: Left Heart Cath and Coronary Angiography;  Surgeon: Belva Crome, MD; LAD 60%, oD1 90%, pD1 70%, D2 70%, CFX patent stent, 30% distal to prev stent, pRCA 20%, OM1 90/95%, NL LV   CARDIAC CATHETERIZATION N/A 05/01/2015   Procedure: Coronary Stent Intervention;  Surgeon: Belva Crome, MD;  Unsuccessful PCI OM due to tortuosity   CARDIAC CATHETERIZATION N/A 11/06/2014   Procedure: Left Heart Cath and Coronary Angiography;  Surgeon: Belva Crome, MD;  Location: [place] [facility];  Service: Cardiovascular;  Laterality: N/A;   CLOSED REDUCTION SHOULDER DISLOCATION Right ~ 1975   "& reattached muscle"   COLONOSCOPY W/ BIOPSIES AND POLYPECTOMY  X 2   ESOPHAGOGASTRODUODENOSCOPY ENDOSCOPY     KNEE ARTHROSCOPY WITH MEDIAL MENISECTOMY Left 09/14/2018   Procedure: LEFT KNEE ARTHROSCOPY CHONDROPLASY,  WITH MEDIAL MENISECTOMY;  Surgeon: Marchia Bond, MD;  Location: [place];  Service: Orthopedics;  Laterality: Left;   KNEE SURGERY Left 11/17/2021   PILONIDAL CYST EXCISION     SHOULDER ARTHROSCOPY WITH DEBRIDEMENT AND BICEP TENDON REPAIR Right 01/18/2019   Procedure: RIGHT SHOULDER ARTHROSCOPY WITH DEBRIDEMENT AND DECOMPRSSION SUBACROMIAL PARTIAL ACROMIOPLASTY;  Surgeon: Marchia Bond, MD;  Location: [place];  Service: Orthopedics;  Laterality: Right;   SHOULDER ARTHROSCOPY WITH ROTATOR CUFF REPAIR Right 01/18/2019   Procedure: SHOULDER ARTHROSCOPY WITH ROTATOR CUFF REPAIR;  Surgeon: Marchia Bond, MD;  Location: [place];  Service: Orthopedics;  Laterality: Right;   Patient Active Problem List   Diagnosis Date Noted   History of  prostate cancer 12/10/2021   Urge incontinence 12/10/2021   Traumatic rupture of left quadriceps tendon 09/30/2021   Acute non-ST elevation myocardial infarction (NSTEMI) ([place]) 05/04/2019   Statin myopathy 04/04/2019   Rupture of right supraspinatus tendon 01/18/2019   Complex tear of medial meniscus of left knee 09/14/2018   Refusal of blood transfusions as patient is Jehovah's Witness    Osteoarthritis    GERD (gastroesophageal reflux disease)    Depression    Chronic lower back pain    Anemia    Essential hypertension 01/03/2017   PAT (paroxysmal atrial tachycardia) 10/27/2015   Type 2  diabetes mellitus with circulatory disorder, without long-term current use of insulin ([place]) 06/19/2015   Angina pectoris ([facility])    CAD (coronary artery disease)    Obstructive sleep apnea 09/06/2014   Prostatitis, acute 12/13/2013   Insomnia 04/19/2013   Obesity with body mass index of 30.0-39.9 10/18/2012   Right lumbar radiculitis 09/29/2011   Adenomatous colon polyp 04/05/2009   Erectile dysfunction 11/11/2008   Hyperlipidemia LDL goal <70 08/23/2008   Benign prostatic hyperplasia with urinary obstruction 08/23/2008    PCP: Haydee Salter, MD  REFERRING PROVIDER: Leandrew Koyanagi, MD  REFERRING DIAG:  Traumatic rupture of left quadriceps tendon, subsequent encounter  THERAPY DIAG:  Chronic pain of left knee  Muscle weakness (generalized)  Rationale for Evaluation and Treatment Rehabilitation  ONSET DATE: 10/08/2021   SUBJECTIVE:  SUBJECTIVE STATEMENT: Patient reports surgery for left knee in July, states his left knee is getting better and the pain has pretty much gone away. He did have previous therapy for a couple of sessions. He states the knee will bother him some when he walks and it will get tired and fatigued quickly, and will occasional buckle on him. He has trouble going up or down stairs whenever using the left leg.   PERTINENT HISTORY: Left quad tendon repair 10/08/2021  PAIN:  Are you having pain? Yes:  NPRS scale: 2/10 (5/10 pain with walking) Pain location: Left knee Pain description: Tired, patient with difficulty describing pain Aggravating factors: Walking Relieving factors: Rest  PRECAUTIONS: None  WEIGHT BEARING RESTRICTIONS No  FALLS:  Has patient fallen in last 6 months? No  LIVING ENVIRONMENT: Lives with: lives with their spouse Lives in: House/apartment Stairs: Yes: External: 7 steps; on left going up  OCCUPATION: Retired  PLOF: [place] Return to biking   OBJECTIVE:  PATIENT SURVEYS:  FOTO 36% functional  status  COGNITION:  Overall cognitive status: Within functional limits for tasks assessed    SENSATION: WFL  EDEMA:  Not formally assessed, patient does exhibit significant lower leg edema that he reports as chronic  MUSCLE LENGTH: Not assessed  PALPATION: Non tender to palpation, there is a palpable divot with boggy feel at the left distal quad tendon just proximal to patella  LOWER EXTREMITY ROM:  Passive ROM Right eval Left eval  Knee flexion 125 120  Knee extension 0 0   LOWER EXTREMITY MMT:  MMT Right eval Left eval  Hip flexion 4 4  Hip extension 4 4-  Hip abduction 4 4-  Knee flexion 5 4  Knee extension 5 2    Patient is able to perform quad set, when patellar stabilized there is no superior movement of the patella when quad activated and there is play if the patella while patient performing active quad set  Patient unable to perform SLR, he demonstrates approximately 45 deg extension lag and exhibits more of  a heel slide when attempting SLR due to inability to extend knee  FUNCTIONAL TESTS:  Sit to stand: patient unable to perform without use of bilateral UE for support  GAIT: Distance walked: 100 ft Assistive device utilized: None Level of assistance: Complete Independence Comments: patient demonstrates quad avoidance gait, hyperextending knee in stance to avoid buckling   TODAY'S TREATMENT: Longsitting quad set 20 x 5 sec Seated clamshell with green x 20 each Seated alternating march with green x 20 Standing heel raises with bilateral UE support at counter  PATIENT EDUCATION:  Education details: Exam findings, POC, HEP; patient highly encouraged to contact surgeon for follow-up appointment, possible use of SPC due to quad weakness Person educated: Patient Education method: Explanation, Demonstration, Tactile cues, Verbal cues, and Handouts Education comprehension: verbalized understanding, returned demonstration, verbal cues required, tactile cues  required, and needs further education  HOME EXERCISE PROGRAM: Access Code: [email or web address]   ASSESSMENT: CLINICAL IMPRESSION: Patient is a 71 y.o. male who was seen today for physical therapy evaluation and treatment for left knee following quad tendon repair on 10/08/2021. Currently he demonstrates significant limitations with his ability to actively extend his knee and his presentation seems consistent with persistent distal quad tendon disruption. He exhibits a 45 deg extension lag with LAQ and SLR, when performing a quad set there is no superior patellar translation and palpable divot felt at distal quad tendon. He ambulates with quad avoidance gait due to poor quad control and risk of buckling.    OBJECTIVE IMPAIRMENTS Abnormal gait, decreased activity tolerance, decreased balance, difficulty walking, decreased strength, improper body mechanics, and pain.   ACTIVITY LIMITATIONS lifting, standing, squatting, stairs, and locomotion level  PARTICIPATION LIMITATIONS: meal prep, cleaning, laundry, shopping, community activity, and yard work  PERSONAL FACTORS Past/current experiences and Time since onset of injury/illness/exacerbation are also affecting patient's functional outcome.   REHAB POTENTIAL: Fair  CLINICAL DECISION MAKING: Stable/uncomplicated  EVALUATION COMPLEXITY: Low   GOALS: Goals reviewed with patient? Yes  SHORT TERM GOALS: Target date: 02/08/2022   Patient will be I with initial HEP in order to progress with therapy. Baseline: HEP provided at eval Goal status: INITIAL  2.  PT will review FOTO with patient by 3rd visit in order to understand expected progress and outcome with therapy. Baseline: FOTO assessed at eval Goal status: INITIAL  3.  Patient will be able to perform sit to stand without using UE assist to improve mobility and indicate improved LE strength Baseline: patient required bilateral UE assist to stand from standard chair Goal status: INITIAL  LONG TERM  GOALS: Target date: 03/08/2022   Patient will be I with final HEP to maintain progress from PT. Baseline: HEP provided at eval Goal status: INITIAL  2.  Patient will report >/= 56% status on FOTO to indicate improved functional ability. Baseline: 36% functional status Goal status: INITIAL  3.  Patient will demonstrate left hamstring strength 5/5 MMT and hip strength >/= 4/5 MMT to improve walking ability and stair negotiation Baseline: left hamstring 4/5 MMT and hip grossly 4-/5 MMT Goal status: INITIAL  4.  Patient will report </= 1/10 left knee pain with activity to reduce functional limitations. Baseline: 5/10 pain with walking Goal status: INITIAL   PLAN: PT FREQUENCY: 1-2x/week  PT DURATION: 8 weeks  PLANNED INTERVENTIONS: Therapeutic exercises, Therapeutic activity, Neuromuscular re-education, Balance training, Gait training, Patient/Family education, Self Care, Joint mobilization, Joint manipulation, Stair training, Aquatic Therapy, Dry Needling, Electrical stimulation, Cryotherapy, Moist heat, Traction, Ionotophoresis '4mg'$ /ml Dexamethasone, Manual  therapy, and Re-evaluation  PLAN FOR NEXT SESSION: Review HEP and progress PRN, quad strengthening as able, focus on strength of hips/hamstrings/lower leg and standing balance/stability training   Hilda Blades, PT, DPT, LAT, ATC 01/11/22  5:32 PM Phone: [phone] Fax: [phone]   Referring diagnosis? V37.482L Treatment diagnosis? (if different than referring diagnosis) M25.562 What was this (referring dx) caused by? '[x]'$  Surgery '[x]'$  Fall '[]'$  Ongoing issue '[]'$  Arthritis '[]'$  Other: ____________  Laterality: '[]'$  Rt '[x]'$  Lt '[]'$  Both  Check all possible CPT codes:  *CHOOSE 10 OR LESS*    '[]'$  97110 (Therapeutic Exercise)  '[]'$  92507 (SLP Treatment)  '[]'$  97112 (Neuro Re-ed)   '[]'$  92526 (Swallowing Treatment)   '[]'$  97116 (Gait Training)   '[]'$  [id number] (Cognitive Training, 1st 15 minutes) '[]'$  97140 (Manual Therapy)   '[]'$  97130  (Cognitive Training, each add'l 15 minutes)  '[]'$  97164 (Re-evaluation)                              '[]'$  Other, List CPT Code ____________  '[]'$  97530 (Therapeutic Activities)     '[]'$  97535 (Self Care)   '[x]'$  All codes above (97110 - 97535)  '[]'$  97012 (Mechanical Traction)  '[x]'$  97014 (E-stim Unattended)  '[x]'$  97032 (E-stim manual)  '[x]'$  97033 (Ionto)  '[]'$  97035 (Ultrasound) '[]'$  97750 (Physical Performance Training) '[x]'$  [id number] (Aquatic Therapy) '[]'$  97016 (Vasopneumatic Device) '[]'$  [id number] (Paraffin) '[]'$  97034 (Contrast Bath) '[]'$  97597 (Wound Care 1st 20 sq cm) '[]'$  97598 (Wound Care each add'l 20 sq cm) '[]'$  97760 (Orthotic Fabrication, Fitting, Training Initial) '[]'$  [id number] (Prosthetic Management and Training Initial) '[]'$  [id number] (Orthotic or Prosthetic Training/ Modification Subsequent)

## 2022-01-11 NOTE — Patient Instructions (Signed)
Access Code: [email or web address] URL: [email or web address] Date: 01/11/2022 Prepared by: Hilda Blades  Exercises - Long Sitting Quad Set  - 3-4 x daily - 20 reps - 5 seconds hold - Seated Hip Abduction with Resistance  - 2 x daily - 2 sets - 20 reps - Seated Knee Lifts with Resistance  - 2 x daily - 2 sets - 20 reps - Heel Raises with Counter Support  - 2 x daily - 2 sets - 20 reps

## 2022-01-12 ENCOUNTER — Telehealth: Payer: Self-pay | Admitting: Physical Medicine and Rehabilitation

## 2022-01-12 DIAGNOSIS — M170 Bilateral primary osteoarthritis of knee: Secondary | ICD-10-CM | POA: Diagnosis not present

## 2022-01-12 NOTE — Telephone Encounter (Signed)
Pt called requesting copy of his xrays done by Dr Ernestina Patches from 09/2021. Please call pt when ready for pick up [phone].

## 2022-01-12 NOTE — Telephone Encounter (Signed)
Patient aware CD is ready for pickup at front desk.  

## 2022-01-13 DIAGNOSIS — S46012A Strain of muscle(s) and tendon(s) of the rotator cuff of left shoulder, initial encounter: Secondary | ICD-10-CM | POA: Diagnosis not present

## 2022-01-13 DIAGNOSIS — M25512 Pain in left shoulder: Secondary | ICD-10-CM | POA: Diagnosis not present

## 2022-01-15 NOTE — Telephone Encounter (Signed)
I called, voicemail not set up. Unable to leave message.

## 2022-01-15 NOTE — Telephone Encounter (Signed)
Patient would like to discuss knee injections being he has a doctor who will do them and he would like permission to get both knees injected at the same time please advise

## 2022-01-15 NOTE — Telephone Encounter (Signed)
Injections are not appropriate because he has a quad rupture and has not followed up in 3 months.

## 2022-01-18 ENCOUNTER — Other Ambulatory Visit: Payer: Self-pay

## 2022-01-18 ENCOUNTER — Ambulatory Visit: Payer: Federal, State, Local not specified - PPO | Admitting: Physical Therapy

## 2022-01-18 ENCOUNTER — Encounter: Payer: Self-pay | Admitting: Physical Therapy

## 2022-01-18 DIAGNOSIS — M6281 Muscle weakness (generalized): Secondary | ICD-10-CM | POA: Diagnosis not present

## 2022-01-18 DIAGNOSIS — G8929 Other chronic pain: Secondary | ICD-10-CM

## 2022-01-18 DIAGNOSIS — M25562 Pain in left knee: Secondary | ICD-10-CM | POA: Diagnosis not present

## 2022-01-18 DIAGNOSIS — S76112D Strain of left quadriceps muscle, fascia and tendon, subsequent encounter: Secondary | ICD-10-CM | POA: Diagnosis not present

## 2022-01-18 NOTE — Therapy (Signed)
OUTPATIENT PHYSICAL THERAPY TREATMENT NOTE   Patient Name: [name] MRN: [id number] DOB:1950-08-07, 71 y.o., male Today's Date: 01/18/2022  PCP: Haydee Salter, MD   REFERRING PROVIDER: Leandrew Koyanagi, MD   END OF SESSION:   PT End of Session - 01/18/22 1630     Visit Number 2    Number of Visits 17    Date for PT Re-Evaluation 03/08/22    Authorization Type [employer] / [employer]    Authorization Time Period 01/11/2022 - 02/12/2022    Authorization - Visit Number 1    Authorization - Number of Visits 10    PT Start Time 1622    PT Stop Time 1700    PT Time Calculation (min) 38 min    Activity Tolerance Patient tolerated treatment well    Behavior During Therapy [facility] for tasks assessed/performed             Past Medical History:  Diagnosis Date   Adenomatous colon polyp 2011   Anemia    Angina pectoris ([place]) 05/01/2015   med rx for 95% OM (u/a to access due to tortuosity), 95% D1 and other moderate CAD at cath   Arthritis    "back, knees" (05/01/2015)   Chest pain    pleuritic   Chronic lower back pain    Complex tear of medial meniscus of left knee 09/14/2018   Coronary artery disease    Depression    GERD (gastroesophageal reflux disease)    HTN (hypertension)    Hyperlipidemia    Obstructive sleep apnea    noncompliant with CPAP   Osteoarthritis    Pneumonia ~ 2014 X 1   Prostate cancer ([place])    Refusal of blood transfusions as patient is Jehovah's Witness    Rotator cuff arthropathy, right    Rupture of right supraspinatus tendon 01/18/2019   Type II or unspecified type diabetes mellitus without mention of complication, not stated as uncontrolled    Past Surgical History:  Procedure Laterality Date   CARDIAC CATHETERIZATION  04/05/2002   patent coronary arteries   CARDIAC CATHETERIZATION  05/01/2015   "tried to put stent in but couldn't"   CARDIAC CATHETERIZATION N/A 05/01/2015   Procedure: Left Heart Cath and Coronary Angiography;  Surgeon: Belva Crome, MD; LAD 60%, oD1 90%, pD1 70%, D2 70%, CFX patent stent, 30% distal to prev stent, pRCA 20%, OM1 90/95%, NL LV   CARDIAC CATHETERIZATION N/A 05/01/2015   Procedure: Coronary Stent Intervention;  Surgeon: Belva Crome, MD;  Unsuccessful PCI OM due to tortuosity   CARDIAC CATHETERIZATION N/A 11/06/2014   Procedure: Left Heart Cath and Coronary Angiography;  Surgeon: Belva Crome, MD;  Location: [facility];  Service: Cardiovascular;  Laterality: N/A;   CLOSED REDUCTION SHOULDER DISLOCATION Right ~ 1975   "& reattached muscle"   COLONOSCOPY W/ BIOPSIES AND POLYPECTOMY  X 2   ESOPHAGOGASTRODUODENOSCOPY ENDOSCOPY     KNEE ARTHROSCOPY WITH MEDIAL MENISECTOMY Left 09/14/2018   Procedure: LEFT KNEE ARTHROSCOPY CHONDROPLASY,  WITH MEDIAL MENISECTOMY;  Surgeon: Marchia Bond, MD;  Location: [place];  Service: Orthopedics;  Laterality: Left;   KNEE SURGERY Left 11/17/2021   PILONIDAL CYST EXCISION     SHOULDER ARTHROSCOPY WITH DEBRIDEMENT AND BICEP TENDON REPAIR Right 01/18/2019   Procedure: RIGHT SHOULDER ARTHROSCOPY WITH DEBRIDEMENT AND DECOMPRSSION SUBACROMIAL PARTIAL ACROMIOPLASTY;  Surgeon: Marchia Bond, MD;  Location: [place];  Service: Orthopedics;  Laterality: Right;   SHOULDER ARTHROSCOPY WITH  ROTATOR CUFF REPAIR Right 01/18/2019   Procedure: SHOULDER ARTHROSCOPY WITH ROTATOR CUFF REPAIR;  Surgeon: Marchia Bond, MD;  Location: [place];  Service: Orthopedics;  Laterality: Right;   Patient Active Problem List   Diagnosis Date Noted   History of prostate cancer 12/10/2021   Urge incontinence 12/10/2021   Traumatic rupture of left quadriceps tendon 09/30/2021   Acute non-ST elevation myocardial infarction (NSTEMI) ([place]) 05/04/2019   Statin myopathy 04/04/2019   Rupture of right supraspinatus tendon 01/18/2019   Complex tear of medial meniscus of left knee 09/14/2018   Refusal of blood transfusions as patient is Jehovah's  Witness    Osteoarthritis    GERD (gastroesophageal reflux disease)    Depression    Chronic lower back pain    Anemia    Essential hypertension 01/03/2017   PAT (paroxysmal atrial tachycardia) 10/27/2015   Type 2 diabetes mellitus with circulatory disorder, without long-term current use of insulin ([place]) 06/19/2015   Angina pectoris ([place])    CAD (coronary artery disease)    Obstructive sleep apnea 09/06/2014   Prostatitis, acute 12/13/2013   Insomnia 04/19/2013   Obesity with body mass index of 30.0-39.9 10/18/2012   Right lumbar radiculitis 09/29/2011   Adenomatous colon polyp 04/05/2009   Erectile dysfunction 11/11/2008   Hyperlipidemia LDL goal <70 08/23/2008   Benign prostatic hyperplasia with urinary obstruction 08/23/2008    REFERRING DIAG: Traumatic rupture of left quadriceps tendon, subsequent encounter  THERAPY DIAG:  Chronic pain of left knee  Muscle weakness (generalized)  Rationale for Evaluation and Treatment Rehabilitation  PERTINENT HISTORY: Left quad tendon repair 10/08/2021  PRECAUTIONS: None   SUBJECTIVE: Patient reports his knees are still bothering him and cause him to walk funny and almost fall frequently. He states he feels unsteady and he did see a doctor who was going to order injections for the right knee because it is bone on bone.   PAIN:  Are you having pain? Yes:  NPRS scale: 3/10 (5/10 pain with walking) Pain location: Left knee Pain description: Tired, patient with difficulty describing pain Aggravating factors: Walking Relieving factors: Rest  PATIENT GOALS Return to biking   OBJECTIVE: (objective measures completed at initial evaluation unless otherwise dated) PATIENT SURVEYS:  FOTO 36% functional status   PALPATION: Non tender to palpation, there is a palpable divot with boggy feel at the left distal quad tendon just proximal to patella   LOWER EXTREMITY ROM:   Passive ROM Right eval Left eval  Knee flexion 125 120  Knee  extension 0 0    LOWER EXTREMITY MMT:   MMT Right eval Left eval Left 01/18/2022  Hip flexion 4 4   Hip extension 4 4-   Hip abduction 4 4-   Knee flexion 5 4   Knee extension '5 2 2             '$ Patient is able to perform quad set, when patellar stabilized there is no superior movement of the patella when quad activated and there is play if the patella while patient performing active quad set   Patient unable to perform SLR, he demonstrates approximately 45 deg extension lag and exhibits more of a heel slide when attempting SLR due to inability to extend knee   FUNCTIONAL TESTS:  Sit to stand: patient unable to perform without use of bilateral UE for support   GAIT: Distance walked: 100 ft Assistive device utilized: None Level of assistance: Complete Independence Comments: patient demonstrates quad avoidance gait, hyperextending  knee in stance to avoid buckling     TODAY'S TREATMENT: [facility] Adult PT Treatment:                                                DATE: 01/18/2022 Therapeutic Exercise: NuStep L7 x 5 min with UE/LE while taking subjective Leg press (cybex) 60# 3 x 10 Hip abduction machine (cybex) 17.5# 2 x 10 Standing heel raises 2 x 20 Tandem stance 3 x 30 sec each Hamstring curl machine 35# 3 x 10 Bridge 2 x 10 Hooklying unilateral clamshell with blue 2 x 10 each   [facility] Adult PT Treatment:                                                DATE: 01/11/2022 Therapeutic Exercise: Longsitting quad set 20 x 5 sec Seated clamshell with green x 20 each Seated alternating march with green x 20 Standing heel raises with bilateral UE support at counter   PATIENT EDUCATION:  Education details: HEP Person educated: Patient Education method: Consulting civil engineer, Media planner, Corporate treasurer cues, Verbal cues Education comprehension: verbalized understanding, returned demonstration, verbal cues required, tactile cues required, and needs further education   HOME EXERCISE PROGRAM: Access  Code: [email or web address]     ASSESSMENT: CLINICAL IMPRESSION: Patient tolerated therapy well with no adverse effects. He continues to demonstrate signs and symptoms consistent with quad tendon disruption. Therapy focused primarily on strengthening for the hips and LE and incorporated standing balance exercise. He does require cueing for proper exercise technique throughout the session. No changes made to HEP this visit but patient provided blue band to progress resistance. Patient would benefit from continued skilled PT to progress his mobility and strength in order to improve walking and maximize functional ability.     OBJECTIVE IMPAIRMENTS Abnormal gait, decreased activity tolerance, decreased balance, difficulty walking, decreased strength, improper body mechanics, and pain.    ACTIVITY LIMITATIONS lifting, standing, squatting, stairs, and locomotion level   PARTICIPATION LIMITATIONS: meal prep, cleaning, laundry, shopping, community activity, and yard work   PERSONAL FACTORS Past/current experiences and Time since onset of injury/illness/exacerbation are also affecting patient's functional outcome.      GOALS: Goals reviewed with patient? Yes   SHORT TERM GOALS: Target date: 02/08/2022    Patient will be I with initial HEP in order to progress with therapy. Baseline: HEP provided at eval Goal status: INITIAL   2.  PT will review FOTO with patient by 3rd visit in order to understand expected progress and outcome with therapy. Baseline: FOTO assessed at eval Goal status: INITIAL   3.  Patient will be able to perform sit to stand without using UE assist to improve mobility and indicate improved LE strength Baseline: patient required bilateral UE assist to stand from standard chair Goal status: INITIAL   LONG TERM GOALS: Target date: 03/08/2022    Patient will be I with final HEP to maintain progress from PT. Baseline: HEP provided at eval Goal status: INITIAL   2.  Patient will report  >/= 56% status on FOTO to indicate improved functional ability. Baseline: 36% functional status Goal status: INITIAL   3.  Patient will demonstrate left hamstring strength 5/5 MMT and hip strength >/= 4/5 MMT to  improve walking ability and stair negotiation Baseline: left hamstring 4/5 MMT and hip grossly 4-/5 MMT Goal status: INITIAL   4.  Patient will report </= 1/10 left knee pain with activity to reduce functional limitations. Baseline: 5/10 pain with walking Goal status: INITIAL     PLAN: PT FREQUENCY: 1-2x/week   PT DURATION: 8 weeks   PLANNED INTERVENTIONS: Therapeutic exercises, Therapeutic activity, Neuromuscular re-education, Balance training, Gait training, Patient/Family education, Self Care, Joint mobilization, Joint manipulation, Stair training, Aquatic Therapy, Dry Needling, Electrical stimulation, Cryotherapy, Moist heat, Traction, Ionotophoresis '4mg'$ /ml Dexamethasone, Manual therapy, and Re-evaluation   PLAN FOR NEXT SESSION: Review HEP and progress PRN, quad strengthening as able, focus on strength of hips/hamstrings/lower leg and standing balance/stability training    Hilda Blades, PT, DPT, LAT, ATC 01/18/22  5:03 PM Phone: [phone] Fax: [phone]

## 2022-01-18 NOTE — Telephone Encounter (Signed)
Called and spoke with patient. Relayed information. He has an appointment for tomorrow with Dr.Xu. He will further discuss options tomorrow.

## 2022-01-19 ENCOUNTER — Ambulatory Visit (INDEPENDENT_AMBULATORY_CARE_PROVIDER_SITE_OTHER): Payer: Federal, State, Local not specified - PPO

## 2022-01-19 ENCOUNTER — Encounter: Payer: Self-pay | Admitting: Orthopaedic Surgery

## 2022-01-19 ENCOUNTER — Ambulatory Visit (INDEPENDENT_AMBULATORY_CARE_PROVIDER_SITE_OTHER): Payer: Federal, State, Local not specified - PPO | Admitting: Orthopaedic Surgery

## 2022-01-19 DIAGNOSIS — S76112D Strain of left quadriceps muscle, fascia and tendon, subsequent encounter: Secondary | ICD-10-CM

## 2022-01-19 DIAGNOSIS — S46012A Strain of muscle(s) and tendon(s) of the rotator cuff of left shoulder, initial encounter: Secondary | ICD-10-CM | POA: Diagnosis not present

## 2022-01-19 NOTE — Telephone Encounter (Signed)
Patient wants to pick up x-rays of todays visit. Patient will pick up later today

## 2022-01-19 NOTE — Progress Notes (Addendum)
Office Visit Note   Patient: [name]           Date of Birth: 08/11/1950           MRN: [id number] Visit Date: 01/19/2022              Requested by: Haydee Salter, MD [place],  [place] [id number] PCP: Haydee Salter, MD   Assessment & Plan: Visit Diagnoses:  1. Traumatic rupture of left quadriceps tendon, subsequent encounter     Plan: Impression is rerupture of the quadriceps tendon.  Original surgery was done on 10/08/2021.  Patient does have an explanation as to why he never followed up after the last visit.  Given the chronic nature of the rerupture and since we are not exactly sure when it reruptured he does not report another injury is hard to say if this is even unreconstructable at this point.  Based on his age and comorbidities I think likelihood of failure with another surgery is fairly high.  Patient seems to be fairly noncompliant as well.  He dismissive continued PT or trying a knee brace that will help prevent his knee from giving way.  At this point he states that he will call us back if he wants to do anything further.  I encouraged him to follow-up in a couple months from today.  Follow-Up Instructions: No follow-ups on file.   Orders:  Orders Placed This Encounter  Procedures   XR Knee 1-2 Views Left   No orders of the defined types were placed in this encounter.     Procedures: No procedures performed   Clinical Data: No additional findings.   Subjective: Chief Complaint  Patient presents with   Left Knee - Pain    HPI Decari returns today for follow-up from left quadriceps repair.  His last visit was August 1.  He did not follow-up 4 weeks after that.  He states that he is experiencing weakness in knee extension and the knee giving way on him.  Physical therapist is concerned that he may have rerupture of the quadriceps.  Review of Systems   Objective: Vital Signs: There were no vitals taken for this  visit.  Physical Exam  Ortho Exam Examination of left knee shows a fully healed surgical scar.  He has about 50 degree extensor lag.  He has a palpable defect superior to the patella.  Passive range of motion is normal.  No joint effusion. Specialty Comments:  No specialty comments available.  Imaging: XR Knee 1-2 Views Left  Result Date: 01/19/2022 X-rays demonstrate normally positioned patella.  There is calcification superior to the patella several centimeters up likely reflective of quadriceps rupture.    PMFS History: Patient Active Problem List   Diagnosis Date Noted   History of prostate cancer 12/10/2021   Urge incontinence 12/10/2021   Traumatic rupture of left quadriceps tendon 09/30/2021   Acute non-ST elevation myocardial infarction (NSTEMI) ([place]) 05/04/2019   Statin myopathy 04/04/2019   Rupture of right supraspinatus tendon 01/18/2019   Complex tear of medial meniscus of left knee 09/14/2018   Refusal of blood transfusions as patient is Jehovah's Witness    Osteoarthritis    GERD (gastroesophageal reflux disease)    Depression    Chronic lower back pain    Anemia    Essential hypertension 01/03/2017   PAT (paroxysmal atrial tachycardia) 10/27/2015   Type 2 diabetes mellitus with circulatory disorder, without long-term current use of insulin (  [place]) 06/19/2015   Angina pectoris ([place])    CAD (coronary artery disease)    Obstructive sleep apnea 09/06/2014   Prostatitis, acute 12/13/2013   Insomnia 04/19/2013   Obesity with body mass index of 30.0-39.9 10/18/2012   Right lumbar radiculitis 09/29/2011   Adenomatous colon polyp 04/05/2009   Erectile dysfunction 11/11/2008   Hyperlipidemia LDL goal <70 08/23/2008   Benign prostatic hyperplasia with urinary obstruction 08/23/2008   Past Medical History:  Diagnosis Date   Adenomatous colon polyp 2011   Anemia    Angina pectoris ([place]) 05/01/2015   med rx for 95% OM (u/a to access due to tortuosity), 95% D1 and  other moderate CAD at cath   Arthritis    "back, knees" (05/01/2015)   Chest pain    pleuritic   Chronic lower back pain    Complex tear of medial meniscus of left knee 09/14/2018   Coronary artery disease    Depression    GERD (gastroesophageal reflux disease)    HTN (hypertension)    Hyperlipidemia    Obstructive sleep apnea    noncompliant with CPAP   Osteoarthritis    Pneumonia ~ 2014 X 1   Prostate cancer ([facility])    Refusal of blood transfusions as patient is Jehovah's Witness    Rotator cuff arthropathy, right    Rupture of right supraspinatus tendon 01/18/2019   Type II or unspecified type diabetes mellitus without mention of complication, not stated as uncontrolled     Family History  Problem Relation Age of Onset   Coronary artery disease Other        CABG   Coronary artery disease Mother    Stroke Mother    Heart disease Mother    Diabetes Mother    Other Father    Diabetes Sister    Diabetes Maternal Uncle        x2   Alcohol abuse Other    Diabetes Paternal Uncle        x2   Colon cancer Paternal Uncle    Colon polyps Neg Hx    Esophageal cancer Neg Hx     Past Surgical History:  Procedure Laterality Date   CARDIAC CATHETERIZATION  04/05/2002   patent coronary arteries   CARDIAC CATHETERIZATION  05/01/2015   "tried to put stent in but couldn't"   CARDIAC CATHETERIZATION N/A 05/01/2015   Procedure: Left Heart Cath and Coronary Angiography;  Surgeon: Belva Crome, MD; LAD 60%, oD1 90%, pD1 70%, D2 70%, CFX patent stent, 30% distal to prev stent, pRCA 20%, OM1 90/95%, NL LV   CARDIAC CATHETERIZATION N/A 05/01/2015   Procedure: Coronary Stent Intervention;  Surgeon: Belva Crome, MD;  Unsuccessful PCI OM due to tortuosity   CARDIAC CATHETERIZATION N/A 11/06/2014   Procedure: Left Heart Cath and Coronary Angiography;  Surgeon: Belva Crome, MD;  Location: [facility];  Service: Cardiovascular;  Laterality: N/A;   CLOSED REDUCTION SHOULDER DISLOCATION  Right ~ 1975   "& reattached muscle"   COLONOSCOPY W/ BIOPSIES AND POLYPECTOMY  X 2   ESOPHAGOGASTRODUODENOSCOPY ENDOSCOPY     KNEE ARTHROSCOPY WITH MEDIAL MENISECTOMY Left 09/14/2018   Procedure: LEFT KNEE ARTHROSCOPY CHONDROPLASY,  WITH MEDIAL MENISECTOMY;  Surgeon: Marchia Bond, MD;  Location: [place] [facility];  Service: Orthopedics;  Laterality: Left;   KNEE SURGERY Left 11/17/2021   PILONIDAL CYST EXCISION     SHOULDER ARTHROSCOPY WITH DEBRIDEMENT AND BICEP TENDON REPAIR Right 01/18/2019   Procedure: RIGHT SHOULDER ARTHROSCOPY  WITH DEBRIDEMENT AND DECOMPRSSION SUBACROMIAL PARTIAL ACROMIOPLASTY;  Surgeon: Marchia Bond, MD;  Location: [place];  Service: Orthopedics;  Laterality: Right;   SHOULDER ARTHROSCOPY WITH ROTATOR CUFF REPAIR Right 01/18/2019   Procedure: SHOULDER ARTHROSCOPY WITH ROTATOR CUFF REPAIR;  Surgeon: Marchia Bond, MD;  Location: [place];  Service: Orthopedics;  Laterality: Right;   Social History   Occupational History   Occupation: Retired    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

## 2022-01-20 NOTE — Telephone Encounter (Signed)
Tried to call patient. No VM setup. Will leave CD at front desk.

## 2022-01-21 ENCOUNTER — Encounter: Payer: Self-pay | Admitting: Orthopaedic Surgery

## 2022-01-22 NOTE — Telephone Encounter (Signed)
I have no idea how to respond to this.  Based on my encounters with the patient, he is a poor historian.  He is easily distracted by his phone.  He does not pay attention when I am talking to him about his plan or treatment options.  He has basically refused every treatment option I have recommended.  I do not know what to do at this point.  I do not know what he is referring to in terms of his treatment team

## 2022-01-22 NOTE — Therapy (Signed)
OUTPATIENT PHYSICAL THERAPY TREATMENT NOTE   Patient Name: [name] MRN: [id number] DOB:[birth year], 71 y.o., male Today's Date: 01/25/2022  PCP: Haydee Salter, MD   REFERRING PROVIDER: Leandrew Koyanagi, MD   END OF SESSION:   PT End of Session - 01/25/22 1622     Visit Number 3    Number of Visits 17    Date for PT Re-Evaluation 03/08/22    Authorization Type BCBS / [employer]    Authorization Time Period 01/11/2022 - 02/12/2022    Authorization - Visit Number 2    Authorization - Number of Visits 10    PT Start Time 8099    PT Stop Time 1655    PT Time Calculation (min) 40 min    Activity Tolerance Patient tolerated treatment well    Behavior During Therapy [facility] for tasks assessed/performed              Past Medical History:  Diagnosis Date   Adenomatous colon polyp 2011   Anemia    Angina pectoris ([place]) 05/01/2015   med rx for 95% OM (u/a to access due to tortuosity), 95% D1 and other moderate CAD at cath   Arthritis    "back, knees" (05/01/2015)   Chest pain    pleuritic   Chronic lower back pain    Complex tear of medial meniscus of left knee 09/14/2018   Coronary artery disease    Depression    GERD (gastroesophageal reflux disease)    HTN (hypertension)    Hyperlipidemia    Obstructive sleep apnea    noncompliant with CPAP   Osteoarthritis    Pneumonia ~ 2014 X 1   Prostate cancer ([place])    Refusal of blood transfusions as patient is Jehovah's Witness    Rotator cuff arthropathy, right    Rupture of right supraspinatus tendon 01/18/2019   Type II or unspecified type diabetes mellitus without mention of complication, not stated as uncontrolled    Past Surgical History:  Procedure Laterality Date   CARDIAC CATHETERIZATION  04/05/2002   patent coronary arteries   CARDIAC CATHETERIZATION  05/01/2015   "tried to put stent in but couldn't"   CARDIAC CATHETERIZATION N/A 05/01/2015   Procedure: Left Heart Cath and Coronary Angiography;  Surgeon: Belva Crome, MD; LAD 60%, oD1 90%, pD1 70%, D2 70%, CFX patent stent, 30% distal to prev stent, pRCA 20%, OM1 90/95%, NL LV   CARDIAC CATHETERIZATION N/A 05/01/2015   Procedure: Coronary Stent Intervention;  Surgeon: Belva Crome, MD;  Unsuccessful PCI OM due to tortuosity   CARDIAC CATHETERIZATION N/A 11/06/2014   Procedure: Left Heart Cath and Coronary Angiography;  Surgeon: Belva Crome, MD;  Location: [place] [facility];  Service: Cardiovascular;  Laterality: N/A;   CLOSED REDUCTION SHOULDER DISLOCATION Right ~ 1975   "& reattached muscle"   COLONOSCOPY W/ BIOPSIES AND POLYPECTOMY  X 2   ESOPHAGOGASTRODUODENOSCOPY ENDOSCOPY     KNEE ARTHROSCOPY WITH MEDIAL MENISECTOMY Left 09/14/2018   Procedure: LEFT KNEE ARTHROSCOPY CHONDROPLASY,  WITH MEDIAL MENISECTOMY;  Surgeon: Marchia Bond, MD;  Location: [place];  Service: Orthopedics;  Laterality: Left;   KNEE SURGERY Left 11/17/2021   PILONIDAL CYST EXCISION     SHOULDER ARTHROSCOPY WITH DEBRIDEMENT AND BICEP TENDON REPAIR Right 01/18/2019   Procedure: RIGHT SHOULDER ARTHROSCOPY WITH DEBRIDEMENT AND DECOMPRSSION SUBACROMIAL PARTIAL ACROMIOPLASTY;  Surgeon: Marchia Bond, MD;  Location: [place];  Service: Orthopedics;  Laterality: Right;   SHOULDER ARTHROSCOPY  WITH ROTATOR CUFF REPAIR Right 01/18/2019   Procedure: SHOULDER ARTHROSCOPY WITH ROTATOR CUFF REPAIR;  Surgeon: Marchia Bond, MD;  Location: [place];  Service: Orthopedics;  Laterality: Right;   Patient Active Problem List   Diagnosis Date Noted   History of prostate cancer 12/10/2021   Urge incontinence 12/10/2021   Traumatic rupture of left quadriceps tendon 09/30/2021   Acute non-ST elevation myocardial infarction (NSTEMI) ([place]) 05/04/2019   Statin myopathy 04/04/2019   Rupture of right supraspinatus tendon 01/18/2019   Complex tear of medial meniscus of left knee 09/14/2018   Refusal of blood transfusions as patient is  Jehovah's Witness    Osteoarthritis    GERD (gastroesophageal reflux disease)    Depression    Chronic lower back pain    Anemia    Essential hypertension 01/03/2017   PAT (paroxysmal atrial tachycardia) 10/27/2015   Type 2 diabetes mellitus with circulatory disorder, without long-term current use of insulin ([place]) 06/19/2015   Angina pectoris ([place])    CAD (coronary artery disease)    Obstructive sleep apnea 09/06/2014   Prostatitis, acute 12/13/2013   Insomnia 04/19/2013   Obesity with body mass index of 30.0-39.9 10/18/2012   Right lumbar radiculitis 09/29/2011   Adenomatous colon polyp 04/05/2009   Erectile dysfunction 11/11/2008   Hyperlipidemia LDL goal <70 08/23/2008   Benign prostatic hyperplasia with urinary obstruction 08/23/2008    REFERRING DIAG: Traumatic rupture of left quadriceps tendon, subsequent encounter  THERAPY DIAG:  Chronic pain of left knee  Muscle weakness (generalized)  Rationale for Evaluation and Treatment Rehabilitation  PERTINENT HISTORY: Left quad tendon repair 10/08/2021  PRECAUTIONS: None   SUBJECTIVE: Patient reports he saw the surgeon and he still has quad tendon tear.   PAIN:  Are you having pain? Yes:  NPRS scale: 5/10 (5/10 pain with walking) Pain location: Left knee Pain description: Tired, patient with difficulty describing pain Aggravating factors: Walking Relieving factors: Rest  PATIENT GOALS Return to biking   OBJECTIVE: (objective measures completed at initial evaluation unless otherwise dated) PATIENT SURVEYS:  FOTO 36% functional status   PALPATION: Non tender to palpation, there is a palpable divot with boggy feel at the left distal quad tendon just proximal to patella   LOWER EXTREMITY ROM:   Passive ROM Right eval Left eval  Knee flexion 125 120  Knee extension 0 0    LOWER EXTREMITY MMT:   MMT Right eval Left eval Left 01/18/2022 Rt / Lt 01/25/2022  Hip flexion 4 4    Hip extension 4 4-  4 / 4-   Hip abduction 4 4-  4 / 4-  Knee flexion 5 4    Knee extension '5 2 2              '$ Patient is able to perform quad set, when patellar stabilized there is no superior movement of the patella when quad activated and there is play if the patella while patient performing active quad set   Patient unable to perform SLR, he demonstrates approximately 45 deg extension lag and exhibits more of a heel slide when attempting SLR due to inability to extend knee   FUNCTIONAL TESTS:  Sit to stand: patient unable to perform without use of bilateral UE for support   GAIT: Distance walked: 100 ft Assistive device utilized: None Level of assistance: Complete Independence Comments: patient demonstrates quad avoidance gait, hyperextending knee in stance to avoid buckling     TODAY'S TREATMENT: [facility] Adult PT Treatment:  DATE: 01/25/2022 Therapeutic Exercise: NuStep L7 x 5 min with UE/LE while taking subjective Bridge 2 x 10 Hooklying clamshell with blue 2 x 20 Sidelying clamshell with blue 2 x 15 Leg press (BATCA) 35# 3 x 15 Hamstring curl machine 25# 3 x 15 Standing heel raises 2 x 20 Standing hip abduction and extension with blue at knees 2 x 10 each   [facility] Adult PT Treatment:                                                DATE: 01/18/2022 Therapeutic Exercise: NuStep L7 x 5 min with UE/LE while taking subjective Leg press (cybex) 60# 3 x 10 Hip abduction machine (cybex) 17.5# 2 x 10 Standing heel raises 2 x 20 Tandem stance 3 x 30 sec each Hamstring curl machine 35# 3 x 10 Bridge 2 x 10 Hooklying unilateral clamshell with blue 2 x 10 each  [facility] Adult PT Treatment:                                                DATE: 01/11/2022 Therapeutic Exercise: Longsitting quad set 20 x 5 sec Seated clamshell with green x 20 each Seated alternating march with green x 20 Standing heel raises with bilateral UE support at counter   PATIENT EDUCATION:   Education details: HEP Person educated: Patient Education method: Consulting civil engineer, Media planner, Corporate treasurer cues, Verbal cues Education comprehension: verbalized understanding, returned demonstration, verbal cues required, tactile cues required, and needs further education   HOME EXERCISE PROGRAM: Access Code: [email or web address]     ASSESSMENT: CLINICAL IMPRESSION: Patient tolerated therapy well with no adverse effects. He saw the surgeon and they believe he continues to have quad tendon tear so patient is planning to switch care to different doctor and continue therapy to strength muscles around the knee as able. Therapy continues to focus on progress hip and global LE strengthening with good tolerance. He did report increased soreness and limping following last visit so reduced weight with exercises and increased repetitions. No changes to HEP this visit. Patient would benefit from continued skilled PT to progress his mobility and strength in order to improve walking and maximize functional ability.     OBJECTIVE IMPAIRMENTS Abnormal gait, decreased activity tolerance, decreased balance, difficulty walking, decreased strength, improper body mechanics, and pain.    ACTIVITY LIMITATIONS lifting, standing, squatting, stairs, and locomotion level   PARTICIPATION LIMITATIONS: meal prep, cleaning, laundry, shopping, community activity, and yard work   PERSONAL FACTORS Past/current experiences and Time since onset of injury/illness/exacerbation are also affecting patient's functional outcome.      GOALS: Goals reviewed with patient? Yes   SHORT TERM GOALS: Target date: 02/08/2022    Patient will be I with initial HEP in order to progress with therapy. Baseline: HEP provided at eval Goal status: INITIAL   2.  PT will review FOTO with patient by 3rd visit in order to understand expected progress and outcome with therapy. Baseline: FOTO assessed at eval Goal status: INITIAL   3.  Patient will be able to  perform sit to stand without using UE assist to improve mobility and indicate improved LE strength Baseline: patient required bilateral UE assist to stand from standard chair Goal status: INITIAL  LONG TERM GOALS: Target date: 03/08/2022    Patient will be I with final HEP to maintain progress from PT. Baseline: HEP provided at eval Goal status: INITIAL   2.  Patient will report >/= 56% status on FOTO to indicate improved functional ability. Baseline: 36% functional status Goal status: INITIAL   3.  Patient will demonstrate left hamstring strength 5/5 MMT and hip strength >/= 4/5 MMT to improve walking ability and stair negotiation Baseline: left hamstring 4/5 MMT and hip grossly 4-/5 MMT Goal status: INITIAL   4.  Patient will report </= 1/10 left knee pain with activity to reduce functional limitations. Baseline: 5/10 pain with walking Goal status: INITIAL     PLAN: PT FREQUENCY: 1-2x/week   PT DURATION: 8 weeks   PLANNED INTERVENTIONS: Therapeutic exercises, Therapeutic activity, Neuromuscular re-education, Balance training, Gait training, Patient/Family education, Self Care, Joint mobilization, Joint manipulation, Stair training, Aquatic Therapy, Dry Needling, Electrical stimulation, Cryotherapy, Moist heat, Traction, Ionotophoresis '4mg'$ /ml Dexamethasone, Manual therapy, and Re-evaluation   PLAN FOR NEXT SESSION: Review HEP and progress PRN, quad strengthening as able, focus on strength of hips/hamstrings/lower leg and standing balance/stability training    Hilda Blades, PT, DPT, LAT, ATC 01/25/22  5:16 PM Phone: [phone] Fax: [phone]

## 2022-01-23 ENCOUNTER — Encounter: Payer: Self-pay | Admitting: Physical Therapy

## 2022-01-25 ENCOUNTER — Ambulatory Visit: Payer: Federal, State, Local not specified - PPO | Admitting: Physical Therapy

## 2022-01-25 ENCOUNTER — Other Ambulatory Visit: Payer: Self-pay

## 2022-01-25 ENCOUNTER — Encounter: Payer: Self-pay | Admitting: Physical Therapy

## 2022-01-25 DIAGNOSIS — G8929 Other chronic pain: Secondary | ICD-10-CM | POA: Diagnosis not present

## 2022-01-25 DIAGNOSIS — S76112D Strain of left quadriceps muscle, fascia and tendon, subsequent encounter: Secondary | ICD-10-CM | POA: Diagnosis not present

## 2022-01-25 DIAGNOSIS — M25562 Pain in left knee: Secondary | ICD-10-CM | POA: Diagnosis not present

## 2022-01-25 DIAGNOSIS — M6281 Muscle weakness (generalized): Secondary | ICD-10-CM | POA: Diagnosis not present

## 2022-01-31 DIAGNOSIS — G4733 Obstructive sleep apnea (adult) (pediatric): Secondary | ICD-10-CM | POA: Diagnosis not present

## 2022-02-01 ENCOUNTER — Other Ambulatory Visit: Payer: Self-pay

## 2022-02-01 ENCOUNTER — Encounter: Payer: Self-pay | Admitting: Physical Therapy

## 2022-02-01 ENCOUNTER — Ambulatory Visit: Payer: Federal, State, Local not specified - PPO | Admitting: Physical Therapy

## 2022-02-01 DIAGNOSIS — M6281 Muscle weakness (generalized): Secondary | ICD-10-CM | POA: Diagnosis not present

## 2022-02-01 DIAGNOSIS — S76112D Strain of left quadriceps muscle, fascia and tendon, subsequent encounter: Secondary | ICD-10-CM | POA: Diagnosis not present

## 2022-02-01 DIAGNOSIS — M25562 Pain in left knee: Secondary | ICD-10-CM | POA: Diagnosis not present

## 2022-02-01 DIAGNOSIS — G8929 Other chronic pain: Secondary | ICD-10-CM | POA: Diagnosis not present

## 2022-02-01 NOTE — Patient Instructions (Signed)
Access Code: [email or web address] URL: [email or web address] Date: 02/01/2022 Prepared by: Hilda Blades  Exercises - Long Sitting Quad Set  - 3-4 x daily - 20 reps - 5 seconds hold - Bridge  - 1 x daily - 3 sets - 10 reps - Seated Hip Abduction with Resistance  - 1 x daily - 3 sets - 20 reps - Seated Knee Lifts with Resistance  - 1 x daily - 3 sets - 20 reps - Heel Raises with Counter Support  - 1 x daily - 2 sets - 20 reps - Standing Hip Abduction with Resistance at Thighs  - 1 x daily - 3 sets - 10 reps

## 2022-02-01 NOTE — Therapy (Signed)
OUTPATIENT PHYSICAL THERAPY TREATMENT NOTE   Patient Name: [name] MRN: [id number] DOB:09/29/1950, 71 y.o., male Today's Date: 02/01/2022  PCP: Haydee Salter, MD   REFERRING PROVIDER: Leandrew Koyanagi, MD   END OF SESSION:   PT End of Session - 02/01/22 1613     Visit Number 4    Number of Visits 17    Date for PT Re-Evaluation 03/08/22    Authorization Type BCBS / [employer]    Authorization Time Period 01/11/2022 - 02/12/2022    Authorization - Visit Number 3    Authorization - Number of Visits 10    PT Start Time 6073    PT Stop Time 1648    PT Time Calculation (min) 39 min    Activity Tolerance Patient tolerated treatment well    Behavior During Therapy [facility] for tasks assessed/performed               Past Medical History:  Diagnosis Date   Adenomatous colon polyp 2011   Anemia    Angina pectoris ([place]) 05/01/2015   med rx for 95% OM (u/a to access due to tortuosity), 95% D1 and other moderate CAD at cath   Arthritis    "back, knees" (05/01/2015)   Chest pain    pleuritic   Chronic lower back pain    Complex tear of medial meniscus of left knee 09/14/2018   Coronary artery disease    Depression    GERD (gastroesophageal reflux disease)    HTN (hypertension)    Hyperlipidemia    Obstructive sleep apnea    noncompliant with CPAP   Osteoarthritis    Pneumonia ~ 2014 X 1   Prostate cancer ([place])    Refusal of blood transfusions as patient is Jehovah's Witness    Rotator cuff arthropathy, right    Rupture of right supraspinatus tendon 01/18/2019   Type II or unspecified type diabetes mellitus without mention of complication, not stated as uncontrolled    Past Surgical History:  Procedure Laterality Date   CARDIAC CATHETERIZATION  04/05/2002   patent coronary arteries   CARDIAC CATHETERIZATION  05/01/2015   "tried to put stent in but couldn't"   CARDIAC CATHETERIZATION N/A 05/01/2015   Procedure: Left Heart Cath and Coronary Angiography;  Surgeon:  Belva Crome, MD; LAD 60%, oD1 90%, pD1 70%, D2 70%, CFX patent stent, 30% distal to prev stent, pRCA 20%, OM1 90/95%, NL LV   CARDIAC CATHETERIZATION N/A 05/01/2015   Procedure: Coronary Stent Intervention;  Surgeon: Belva Crome, MD;  Unsuccessful PCI OM due to tortuosity   CARDIAC CATHETERIZATION N/A 11/06/2014   Procedure: Left Heart Cath and Coronary Angiography;  Surgeon: Belva Crome, MD;  Location: [place] [facility];  Service: Cardiovascular;  Laterality: N/A;   CLOSED REDUCTION SHOULDER DISLOCATION Right ~ 1975   "& reattached muscle"   COLONOSCOPY W/ BIOPSIES AND POLYPECTOMY  X 2   ESOPHAGOGASTRODUODENOSCOPY ENDOSCOPY     KNEE ARTHROSCOPY WITH MEDIAL MENISECTOMY Left 09/14/2018   Procedure: LEFT KNEE ARTHROSCOPY CHONDROPLASY,  WITH MEDIAL MENISECTOMY;  Surgeon: Marchia Bond, MD;  Location: [place];  Service: Orthopedics;  Laterality: Left;   KNEE SURGERY Left 11/17/2021   PILONIDAL CYST EXCISION     SHOULDER ARTHROSCOPY WITH DEBRIDEMENT AND BICEP TENDON REPAIR Right 01/18/2019   Procedure: RIGHT SHOULDER ARTHROSCOPY WITH DEBRIDEMENT AND DECOMPRSSION SUBACROMIAL PARTIAL ACROMIOPLASTY;  Surgeon: Marchia Bond, MD;  Location: [place];  Service: Orthopedics;  Laterality: Right;   SHOULDER  ARTHROSCOPY WITH ROTATOR CUFF REPAIR Right 01/18/2019   Procedure: SHOULDER ARTHROSCOPY WITH ROTATOR CUFF REPAIR;  Surgeon: Marchia Bond, MD;  Location: [place];  Service: Orthopedics;  Laterality: Right;   Patient Active Problem List   Diagnosis Date Noted   History of prostate cancer 12/10/2021   Urge incontinence 12/10/2021   Traumatic rupture of left quadriceps tendon 09/30/2021   Acute non-ST elevation myocardial infarction (NSTEMI) ([place]) 05/04/2019   Statin myopathy 04/04/2019   Rupture of right supraspinatus tendon 01/18/2019   Complex tear of medial meniscus of left knee 09/14/2018   Refusal of blood transfusions as patient is  Jehovah's Witness    Osteoarthritis    GERD (gastroesophageal reflux disease)    Depression    Chronic lower back pain    Anemia    Essential hypertension 01/03/2017   PAT (paroxysmal atrial tachycardia) 10/27/2015   Type 2 diabetes mellitus with circulatory disorder, without long-term current use of insulin ([place]) 06/19/2015   Angina pectoris ([place])    CAD (coronary artery disease)    Obstructive sleep apnea 09/06/2014   Prostatitis, acute 12/13/2013   Insomnia 04/19/2013   Obesity with body mass index of 30.0-39.9 10/18/2012   Right lumbar radiculitis 09/29/2011   Adenomatous colon polyp 04/05/2009   Erectile dysfunction 11/11/2008   Hyperlipidemia LDL goal <70 08/23/2008   Benign prostatic hyperplasia with urinary obstruction 08/23/2008    REFERRING DIAG: Traumatic rupture of left quadriceps tendon, subsequent encounter  THERAPY DIAG:  Chronic pain of left knee  Muscle weakness (generalized)  Rationale for Evaluation and Treatment Rehabilitation  PERTINENT HISTORY: Left quad tendon repair 10/08/2021  PRECAUTIONS: None   SUBJECTIVE: Patient reports he is doing about the same, knee is doing ok as long as he isn't trying to walk a lot.  PAIN:  Are you having pain? Yes:  NPRS scale: 5/10 (5/10 pain with walking) Pain location: Left knee Pain description: Tired, patient with difficulty describing pain Aggravating factors: Walking Relieving factors: Rest  PATIENT GOALS Return to biking   OBJECTIVE: (objective measures completed at initial evaluation unless otherwise dated) PATIENT SURVEYS:  FOTO 36% functional status   PALPATION: Non tender to palpation, there is a palpable divot with boggy feel at the left distal quad tendon just proximal to patella   LOWER EXTREMITY ROM:   Passive ROM Right eval Left eval  Knee flexion 125 120  Knee extension 0 0    LOWER EXTREMITY MMT:   MMT Right eval Left eval Left 01/18/2022 Rt / Lt 01/25/2022 Rt / Lt 02/01/2022   Hip flexion '4 4   4 '$ / 4  Hip extension 4 4-  4 / 4-   Hip abduction 4 4-  4 / 4- 4- / 4-  Knee flexion '5 4   5 '$ / 4+  Knee extension '5 2 2               '$ Patient is able to perform quad set, when patellar stabilized there is no superior movement of the patella when quad activated and there is play if the patella while patient performing active quad set   Patient unable to perform SLR, he demonstrates approximately 45 deg extension lag and exhibits more of a heel slide when attempting SLR due to inability to extend knee   FUNCTIONAL TESTS:  Sit to stand: patient unable to perform without use of bilateral UE for support   GAIT: Distance walked: 100 ft Assistive device utilized: None Level of assistance: Complete Independence Comments:  patient demonstrates quad avoidance gait, hyperextending knee in stance to avoid buckling     TODAY'S TREATMENT: [facility] Adult PT Treatment:                                                DATE: 02/01/2022 Therapeutic Exercise: NuStep L7 x 5 min with UE/LE while taking subjective Leg press (BATCA) 45# 3 x 15 Hamstring curl machine 35# 3 x 15 Standing hip abduction and extension with blue at knees 2 x 10 each Standing heel raises 2 x 20 Tandem stance 3 x 30 sec each RDL 25# KB from 8" box 2 x 10   [facility] Adult PT Treatment:                                                DATE: 01/25/2022 Therapeutic Exercise: NuStep L7 x 5 min with UE/LE while taking subjective Bridge 2 x 10 Hooklying clamshell with blue 2 x 20 Sidelying clamshell with blue 2 x 15 Leg press (BATCA) 35# 3 x 15 Hamstring curl machine 25# 3 x 15 Standing heel raises 2 x 20 Standing hip abduction and extension with blue at knees 2 x 10 each  [facility] Adult PT Treatment:                                                DATE: 01/18/2022 Therapeutic Exercise: NuStep L7 x 5 min with UE/LE while taking subjective Leg press (cybex) 60# 3 x 10 Hip abduction machine (cybex) 17.5# 2 x 10 Standing  heel raises 2 x 20 Tandem stance 3 x 30 sec each Hamstring curl machine 35# 3 x 10 Bridge 2 x 10 Hooklying unilateral clamshell with blue 2 x 10 each   PATIENT EDUCATION:  Education details: HEP Person educated: Patient Education method: Consulting civil engineer, Demonstration, Corporate treasurer cues, Verbal cues Education comprehension: verbalized understanding, returned demonstration, verbal cues required, tactile cues required, and needs further education   HOME EXERCISE PROGRAM: Access Code: [email or web address]     ASSESSMENT: CLINICAL IMPRESSION: Patient tolerated therapy well with no adverse effects. Therapy continues to focus on generalized strengthening of the LE to improve knee control due to quad tendon tear. He is progressing nicely in therapy and tolerating increased resistance with exercises. He does require cueing for proper exercise technique and posture with exercises. Updated HEP to progress strengthening. Patient would benefit from continued skilled PT to progress his mobility and strength in order to improve walking and maximize functional ability.     OBJECTIVE IMPAIRMENTS Abnormal gait, decreased activity tolerance, decreased balance, difficulty walking, decreased strength, improper body mechanics, and pain.    ACTIVITY LIMITATIONS lifting, standing, squatting, stairs, and locomotion level   PARTICIPATION LIMITATIONS: meal prep, cleaning, laundry, shopping, community activity, and yard work   PERSONAL FACTORS Past/current experiences and Time since onset of injury/illness/exacerbation are also affecting patient's functional outcome.      GOALS: Goals reviewed with patient? Yes   SHORT TERM GOALS: Target date: 02/08/2022    Patient will be I with initial HEP in order to progress with therapy. Baseline: HEP provided at eval Goal  status: INITIAL   2.  PT will review FOTO with patient by 3rd visit in order to understand expected progress and outcome with therapy. Baseline: FOTO assessed at  eval Goal status: INITIAL   3.  Patient will be able to perform sit to stand without using UE assist to improve mobility and indicate improved LE strength Baseline: patient required bilateral UE assist to stand from standard chair Goal status: INITIAL   LONG TERM GOALS: Target date: 03/08/2022    Patient will be I with final HEP to maintain progress from PT. Baseline: HEP provided at eval Goal status: INITIAL   2.  Patient will report >/= 56% status on FOTO to indicate improved functional ability. Baseline: 36% functional status Goal status: INITIAL   3.  Patient will demonstrate left hamstring strength 5/5 MMT and hip strength >/= 4/5 MMT to improve walking ability and stair negotiation Baseline: left hamstring 4/5 MMT and hip grossly 4-/5 MMT Goal status: INITIAL   4.  Patient will report </= 1/10 left knee pain with activity to reduce functional limitations. Baseline: 5/10 pain with walking Goal status: INITIAL     PLAN: PT FREQUENCY: 1-2x/week   PT DURATION: 8 weeks   PLANNED INTERVENTIONS: Therapeutic exercises, Therapeutic activity, Neuromuscular re-education, Balance training, Gait training, Patient/Family education, Self Care, Joint mobilization, Joint manipulation, Stair training, Aquatic Therapy, Dry Needling, Electrical stimulation, Cryotherapy, Moist heat, Traction, Ionotophoresis '4mg'$ /ml Dexamethasone, Manual therapy, and Re-evaluation   PLAN FOR NEXT SESSION: Review HEP and progress PRN, quad strengthening as able, focus on strength of hips/hamstrings/lower leg and standing balance/stability training    Hilda Blades, PT, DPT, LAT, ATC 02/01/22  4:49 PM Phone: [phone] Fax: [phone]

## 2022-02-03 DIAGNOSIS — M1711 Unilateral primary osteoarthritis, right knee: Secondary | ICD-10-CM | POA: Diagnosis not present

## 2022-02-03 DIAGNOSIS — S76112D Strain of left quadriceps muscle, fascia and tendon, subsequent encounter: Secondary | ICD-10-CM | POA: Diagnosis not present

## 2022-02-05 ENCOUNTER — Telehealth: Payer: Self-pay | Admitting: Pharmacist

## 2022-02-05 NOTE — Progress Notes (Signed)
    Chronic Care Management Pharmacy Assistant   Name: BERTHA EARWOOD  MRN: [id number] DOB: 30-Nov-1950   Reason for Encounter: Disease State - Diabetes Call      Patient has changed PCP and is no longer in CCM program.   Jobe [place], [facility] Clinical Pharmacist Assistant  [phone]

## 2022-02-08 ENCOUNTER — Other Ambulatory Visit: Payer: Self-pay

## 2022-02-08 ENCOUNTER — Other Ambulatory Visit (HOSPITAL_COMMUNITY): Payer: Self-pay | Admitting: Orthopedic Surgery

## 2022-02-08 ENCOUNTER — Encounter: Payer: Self-pay | Admitting: Physical Therapy

## 2022-02-08 ENCOUNTER — Ambulatory Visit: Payer: Federal, State, Local not specified - PPO | Attending: Family Medicine | Admitting: Physical Therapy

## 2022-02-08 DIAGNOSIS — M6281 Muscle weakness (generalized): Secondary | ICD-10-CM | POA: Insufficient documentation

## 2022-02-08 DIAGNOSIS — S76112D Strain of left quadriceps muscle, fascia and tendon, subsequent encounter: Secondary | ICD-10-CM

## 2022-02-08 DIAGNOSIS — M25562 Pain in left knee: Secondary | ICD-10-CM | POA: Diagnosis not present

## 2022-02-08 DIAGNOSIS — G8929 Other chronic pain: Secondary | ICD-10-CM | POA: Diagnosis not present

## 2022-02-08 NOTE — Therapy (Addendum)
OUTPATIENT PHYSICAL THERAPY TREATMENT NOTE  DISCHARGE   Patient Name: [name] MRN: [id number] DOB:1950/07/07, 71 y.o., male Today's Date: 02/08/2022  PCP: Haydee Salter, MD   REFERRING PROVIDER: Leandrew Koyanagi, MD   END OF SESSION:   PT End of Session - 02/08/22 1628     Visit Number 5    Number of Visits 17    Date for PT Re-Evaluation 03/08/22    Authorization Type [employer] / [employer]    Authorization Time Period 01/11/2022 - 02/12/2022    Authorization - Visit Number 4    Authorization - Number of Visits 10    PT Start Time 9379    PT Stop Time 1700    PT Time Calculation (min) 43 min    Activity Tolerance Patient tolerated treatment well    Behavior During Therapy [facility] for tasks assessed/performed                Past Medical History:  Diagnosis Date   Adenomatous colon polyp 2011   Anemia    Angina pectoris ([place]) 05/01/2015   med rx for 95% OM (u/a to access due to tortuosity), 95% D1 and other moderate CAD at cath   Arthritis    "back, knees" (05/01/2015)   Chest pain    pleuritic   Chronic lower back pain    Complex tear of medial meniscus of left knee 09/14/2018   Coronary artery disease    Depression    GERD (gastroesophageal reflux disease)    HTN (hypertension)    Hyperlipidemia    Obstructive sleep apnea    noncompliant with CPAP   Osteoarthritis    Pneumonia ~ 2014 X 1   Prostate cancer ([place])    Refusal of blood transfusions as patient is Jehovah's Witness    Rotator cuff arthropathy, right    Rupture of right supraspinatus tendon 01/18/2019   Type II or unspecified type diabetes mellitus without mention of complication, not stated as uncontrolled    Past Surgical History:  Procedure Laterality Date   CARDIAC CATHETERIZATION  04/05/2002   patent coronary arteries   CARDIAC CATHETERIZATION  05/01/2015   "tried to put stent in but couldn't"   CARDIAC CATHETERIZATION N/A 05/01/2015   Procedure: Left Heart Cath and Coronary Angiography;   Surgeon: Belva Crome, MD; LAD 60%, oD1 90%, pD1 70%, D2 70%, CFX patent stent, 30% distal to prev stent, pRCA 20%, OM1 90/95%, NL LV   CARDIAC CATHETERIZATION N/A 05/01/2015   Procedure: Coronary Stent Intervention;  Surgeon: Belva Crome, MD;  Unsuccessful PCI OM due to tortuosity   CARDIAC CATHETERIZATION N/A 11/06/2014   Procedure: Left Heart Cath and Coronary Angiography;  Surgeon: Belva Crome, MD;  Location: [facility];  Service: Cardiovascular;  Laterality: N/A;   CLOSED REDUCTION SHOULDER DISLOCATION Right ~ 1975   "& reattached muscle"   COLONOSCOPY W/ BIOPSIES AND POLYPECTOMY  X 2   ESOPHAGOGASTRODUODENOSCOPY ENDOSCOPY     KNEE ARTHROSCOPY WITH MEDIAL MENISECTOMY Left 09/14/2018   Procedure: LEFT KNEE ARTHROSCOPY CHONDROPLASY,  WITH MEDIAL MENISECTOMY;  Surgeon: Marchia Bond, MD;  Location: [facility];  Service: Orthopedics;  Laterality: Left;   KNEE SURGERY Left 11/17/2021   PILONIDAL CYST EXCISION     SHOULDER ARTHROSCOPY WITH DEBRIDEMENT AND BICEP TENDON REPAIR Right 01/18/2019   Procedure: RIGHT SHOULDER ARTHROSCOPY WITH DEBRIDEMENT AND DECOMPRSSION SUBACROMIAL PARTIAL ACROMIOPLASTY;  Surgeon: Marchia Bond, MD;  Location: [facility];  Service: Orthopedics;  Laterality: Right;  SHOULDER ARTHROSCOPY WITH ROTATOR CUFF REPAIR Right 01/18/2019   Procedure: SHOULDER ARTHROSCOPY WITH ROTATOR CUFF REPAIR;  Surgeon: Marchia Bond, MD;  Location: [facility];  Service: Orthopedics;  Laterality: Right;   Patient Active Problem List   Diagnosis Date Noted   History of prostate cancer 12/10/2021   Urge incontinence 12/10/2021   Traumatic rupture of left quadriceps tendon 09/30/2021   Acute non-ST elevation myocardial infarction (NSTEMI) ([place]) 05/04/2019   Statin myopathy 04/04/2019   Rupture of right supraspinatus tendon 01/18/2019   Complex tear of medial meniscus of left knee 09/14/2018   Refusal of blood transfusions as  patient is Jehovah's Witness    Osteoarthritis    GERD (gastroesophageal reflux disease)    Depression    Chronic lower back pain    Anemia    Essential hypertension 01/03/2017   PAT (paroxysmal atrial tachycardia) 10/27/2015   Type 2 diabetes mellitus with circulatory disorder, without long-term current use of insulin ([place]) 06/19/2015   Angina pectoris ([place])    CAD (coronary artery disease)    Obstructive sleep apnea 09/06/2014   Prostatitis, acute 12/13/2013   Insomnia 04/19/2013   Obesity with body mass index of 30.0-39.9 10/18/2012   Right lumbar radiculitis 09/29/2011   Adenomatous colon polyp 04/05/2009   Erectile dysfunction 11/11/2008   Hyperlipidemia LDL goal <70 08/23/2008   Benign prostatic hyperplasia with urinary obstruction 08/23/2008    REFERRING DIAG: Traumatic rupture of left quadriceps tendon, subsequent encounter  THERAPY DIAG:  Chronic pain of left knee  Muscle weakness (generalized)  Rationale for Evaluation and Treatment Rehabilitation  PERTINENT HISTORY: Left quad tendon repair 10/08/2021  PRECAUTIONS: None   SUBJECTIVE: Patient reports he is doing about the same, his knee doesn't really hurt but he has to be careful with walking and movement, and whenever he wears shoes with heels then his knee wants to buckle.  PAIN:  Are you having pain? Yes:  NPRS scale: 5/10 (5/10 pain with walking) Pain location: Left knee Pain description: Tired, patient with difficulty describing pain Aggravating factors: Walking Relieving factors: Rest  PATIENT GOALS Return to biking   OBJECTIVE: (objective measures completed at initial evaluation unless otherwise dated) PATIENT SURVEYS:  FOTO 36% functional status  02/08/2022: 44%   PALPATION: Non tender to palpation, there is a palpable divot with boggy feel at the left distal quad tendon just proximal to patella   LOWER EXTREMITY ROM:   Passive ROM Right eval Left eval  Knee flexion 125 120  Knee extension  0 0    LOWER EXTREMITY MMT:   MMT Right eval Left eval Left 01/18/2022 Rt / Lt 01/25/2022 Rt / Lt 02/01/2022 Rt / Lt 02/08/2022  Hip flexion _0 / 4   Hip extension 4 4-  4 / 4-    Hip abduction 4 4-  4 / 4- 4- / 4- 4- / 4-  Knee flexion _1 / 4+ 5 / 5  Knee extension _2 Patient is able to perform quad set, when patellar stabilized there is no superior movement of the patella when quad activated and there is play if the patella while patient performing active quad set   Patient unable to perform SLR, he demonstrates approximately 45 deg extension lag and exhibits more of a heel slide when attempting SLR due to inability to extend knee   FUNCTIONAL TESTS:  Sit to stand: patient unable to perform without use of bilateral UE for support 02/08/2022: patient able to perform sit to stand without UE support   GAIT: Distance walked: 100 ft Assistive device utilized: None Level of assistance: Complete Independence Comments: patient demonstrates quad avoidance gait, hyperextending knee in stance to avoid buckling     TODAY'S TREATMENT: [facility] Adult PT Treatment:                                                DATE: 02/08/2022 Therapeutic Exercise: NuStep L7 x 5 min with UE/LE while taking subjective Hooklying clamshell with black 2 x 20 Bridge with black at knees 2 x 10 Leg press (cybex) 80# 3 x 10 Hamstring curl machine 45# 3 x 15 Hip abduction machine 30# 2 x 10 each Standing heel raises edge of step 2 x 20   [facility] Adult PT Treatment:                                                DATE: 02/01/2022 Therapeutic Exercise: NuStep L7 x 5 min with UE/LE while taking subjective Leg press (BATCA) 45# 3 x 15 Hamstring curl machine 35# 3 x 15 Standing hip abduction and extension with blue at knees 2 x 10 each Standing heel raises 2 x 20 Tandem stance 3 x 30 sec each RDL 25# KB from 8" box 2 x 10  [facility] Adult PT Treatment:                                                 DATE: 01/25/2022 Therapeutic Exercise: NuStep L7 x 5 min with UE/LE while taking subjective Bridge 2 x 10 Hooklying clamshell with blue 2 x 20 Sidelying clamshell with blue 2 x 15 Leg press (BATCA) 35# 3 x 15 Hamstring curl machine 25# 3 x 15 Standing heel raises 2 x 20 Standing hip abduction and extension with blue at knees 2 x 10 each   PATIENT EDUCATION:  Education details: HEP, FOTO Person educated: Patient Education method: Explanation, Demonstration, Tactile cues, Verbal cues Education comprehension: verbalized understanding, returned demonstration, verbal cues required, tactile cues required, and needs further education   HOME EXERCISE PROGRAM: Access Code: [email or web address]     ASSESSMENT: CLINICAL IMPRESSION: Patient tolerated therapy well with no adverse effects. Therapy focused on progression of LE strengthening with good tolerance. Exercises able to increase with resistance no pain reported with therapy. He does demonstrate improved ability to perform transfers, and reports improved functional ability on FOTO. No changes to HEP this visit. Patient would benefit from continued skilled PT to progress his mobility and strength in order to improve walking and maximize functional ability.     OBJECTIVE IMPAIRMENTS Abnormal gait, decreased activity tolerance, decreased balance, difficulty walking, decreased strength, improper body mechanics, and pain.    ACTIVITY LIMITATIONS lifting, standing, squatting, stairs, and locomotion level   PARTICIPATION LIMITATIONS: meal prep, cleaning, laundry, shopping, community activity, and yard work   PERSONAL FACTORS Past/current experiences and Time since onset of injury/illness/exacerbation are also affecting patient's functional outcome.      GOALS:  Goals reviewed with patient? Yes   SHORT TERM GOALS: Target date: 02/08/2022    Patient will be I with initial HEP in order to progress with therapy. Baseline: HEP provided at  eval 02/08/2022: independent with initial HEP Goal status: MET   2.  PT will review FOTO with patient by 3rd visit in order to understand expected progress and outcome with therapy. Baseline: FOTO assessed at eval 02/08/2022: reviewed Goal status: MET   3.  Patient will be able to perform sit to stand without using UE assist to improve mobility and indicate improved LE strength Baseline: patient required bilateral UE assist to stand from standard chair 02/08/2022: patient able to perform sit to stand without UE support Goal status: MET   LONG TERM GOALS: Target date: 03/08/2022    Patient will be I with final HEP to maintain progress from PT. Baseline: HEP provided at eval 02/08/2022: progressing Goal status: ONGOING   2.  Patient will report >/= 56% status on FOTO to indicate improved functional ability. Baseline: 36% functional status 02/08/2022: 44% Goal status: PARTIALLY MET   3.  Patient will demonstrate left hamstring strength 5/5 MMT and hip strength >/= 4/5 MMT to improve walking ability and stair negotiation Baseline: left hamstring 4/5 MMT and hip grossly 4-/5 MMT 02/08/2022: left hamstring 5/5 MMT and hip grossly 4-/5 MMT Goal status: PARTIALLY MET   4.  Patient will report </= 1/10 left knee pain with activity to reduce functional limitations. Baseline: 5/10 pain with walking 02/08/2022: patient denies knee pain Goal status: MET   PLAN: PT FREQUENCY: 1-2x/week   PT DURATION: 8 weeks   PLANNED INTERVENTIONS: Therapeutic exercises, Therapeutic activity, Neuromuscular re-education, Balance training, Gait training, Patient/Family education, Self Care, Joint mobilization, Joint manipulation, Stair training, Aquatic Therapy, Dry Needling, Electrical stimulation, Cryotherapy, Moist heat, Traction, Ionotophoresis 21m/ml Dexamethasone, Manual therapy, and Re-evaluation   PLAN FOR NEXT SESSION: Review HEP and progress PRN, quad strengthening as able, focus on strength of  hips/hamstrings/lower leg and standing balance/stability training    CHilda Blades PT, DPT, LAT, ATC 02/08/22  5:05 PM Phone: [phone]: [phone]  PHYSICAL THERAPY DISCHARGE SUMMARY  Visits from Start of Care: 5  Current functional level related to goals / functional outcomes: See above   Remaining deficits: See above   Education / Equipment: HEP   Patient agrees to discharge. Patient goals were not met. Patient is being discharged due to not returning since the last visit.  CHilda Blades PT, DPT, LAT, ATC 03/05/22  10:05 AM Phone: [phone]: [phone]

## 2022-02-10 DIAGNOSIS — M1711 Unilateral primary osteoarthritis, right knee: Secondary | ICD-10-CM | POA: Diagnosis not present

## 2022-02-16 ENCOUNTER — Ambulatory Visit (INDEPENDENT_AMBULATORY_CARE_PROVIDER_SITE_OTHER): Payer: Federal, State, Local not specified - PPO

## 2022-02-16 DIAGNOSIS — Z23 Encounter for immunization: Secondary | ICD-10-CM | POA: Diagnosis not present

## 2022-02-16 NOTE — Progress Notes (Signed)
After obtaining consent, and per orders of Dr. Gena Fray, injection of Shingles Vaccine given by Augustina Mood. Patient tolerated injection well and instructed to remain in clinic for 20 minutes afterwards, and to report any adverse reaction to me immediately.

## 2022-02-17 DIAGNOSIS — M1711 Unilateral primary osteoarthritis, right knee: Secondary | ICD-10-CM | POA: Diagnosis not present

## 2022-02-18 ENCOUNTER — Ambulatory Visit (HOSPITAL_COMMUNITY)
Admission: RE | Admit: 2022-02-18 | Discharge: 2022-02-18 | Disposition: A | Discharge status: Home / Self Care | Payer: Federal, State, Local not specified - PPO | Source: Ambulatory Visit | Attending: Orthopedic Surgery | Admitting: Orthopedic Surgery

## 2022-02-18 DIAGNOSIS — S76112D Strain of left quadriceps muscle, fascia and tendon, subsequent encounter: Secondary | ICD-10-CM | POA: Insufficient documentation

## 2022-02-18 DIAGNOSIS — M25462 Effusion, left knee: Secondary | ICD-10-CM | POA: Diagnosis not present

## 2022-02-18 DIAGNOSIS — M7122 Synovial cyst of popliteal space [Baker], left knee: Secondary | ICD-10-CM | POA: Diagnosis not present

## 2022-02-18 DIAGNOSIS — M1712 Unilateral primary osteoarthritis, left knee: Secondary | ICD-10-CM | POA: Diagnosis not present

## 2022-02-18 DIAGNOSIS — S76112A Strain of left quadriceps muscle, fascia and tendon, initial encounter: Secondary | ICD-10-CM | POA: Diagnosis not present

## 2022-02-24 DIAGNOSIS — S76112D Strain of left quadriceps muscle, fascia and tendon, subsequent encounter: Secondary | ICD-10-CM | POA: Diagnosis not present

## 2022-03-01 ENCOUNTER — Other Ambulatory Visit: Payer: Self-pay | Admitting: Interventional Cardiology

## 2022-03-01 ENCOUNTER — Other Ambulatory Visit: Payer: Self-pay

## 2022-03-02 ENCOUNTER — Other Ambulatory Visit: Payer: Self-pay | Admitting: Orthopedic Surgery

## 2022-03-03 DIAGNOSIS — G4733 Obstructive sleep apnea (adult) (pediatric): Secondary | ICD-10-CM | POA: Diagnosis not present

## 2022-03-08 ENCOUNTER — Inpatient Hospital Stay: Admission: RE | Admit: 2022-03-08 | Payer: Federal, State, Local not specified - PPO | Source: Ambulatory Visit

## 2022-03-08 ENCOUNTER — Other Ambulatory Visit: Payer: Federal, State, Local not specified - PPO

## 2022-03-09 ENCOUNTER — Encounter: Admission: RE | Payer: Self-pay | Source: Ambulatory Visit

## 2022-03-09 ENCOUNTER — Ambulatory Visit: Admit: 2022-03-09 | Payer: Federal, State, Local not specified - PPO | Admitting: Orthopedic Surgery

## 2022-03-09 ENCOUNTER — Ambulatory Visit
Admission: RE | Admit: 2022-03-09 | Payer: Federal, State, Local not specified - PPO | Source: Ambulatory Visit | Admitting: Orthopedic Surgery

## 2022-03-09 SURGERY — REPAIR, TENDON, QUADRICEPS
Anesthesia: Choice | Laterality: Left

## 2022-03-10 DIAGNOSIS — E1159 Type 2 diabetes mellitus with other circulatory complications: Secondary | ICD-10-CM | POA: Diagnosis not present

## 2022-03-10 DIAGNOSIS — I10 Essential (primary) hypertension: Secondary | ICD-10-CM | POA: Diagnosis not present

## 2022-03-10 DIAGNOSIS — E782 Mixed hyperlipidemia: Secondary | ICD-10-CM | POA: Diagnosis not present

## 2022-03-15 DIAGNOSIS — E668 Other obesity: Secondary | ICD-10-CM | POA: Diagnosis not present

## 2022-03-15 DIAGNOSIS — I2510 Atherosclerotic heart disease of native coronary artery without angina pectoris: Secondary | ICD-10-CM | POA: Diagnosis not present

## 2022-03-15 DIAGNOSIS — E782 Mixed hyperlipidemia: Secondary | ICD-10-CM | POA: Diagnosis not present

## 2022-03-15 DIAGNOSIS — M25562 Pain in left knee: Secondary | ICD-10-CM | POA: Diagnosis not present

## 2022-03-15 DIAGNOSIS — I10 Essential (primary) hypertension: Secondary | ICD-10-CM | POA: Diagnosis not present

## 2022-03-15 DIAGNOSIS — E1159 Type 2 diabetes mellitus with other circulatory complications: Secondary | ICD-10-CM | POA: Diagnosis not present

## 2022-03-22 DIAGNOSIS — S76112A Strain of left quadriceps muscle, fascia and tendon, initial encounter: Secondary | ICD-10-CM | POA: Diagnosis not present

## 2022-03-23 ENCOUNTER — Ambulatory Visit: Payer: Federal, State, Local not specified - PPO | Admitting: Orthopaedic Surgery

## 2022-04-14 ENCOUNTER — Ambulatory Visit: Payer: Federal, State, Local not specified - PPO | Admitting: Urology

## 2022-04-14 DIAGNOSIS — M25562 Pain in left knee: Secondary | ICD-10-CM | POA: Diagnosis not present

## 2022-04-14 DIAGNOSIS — I2510 Atherosclerotic heart disease of native coronary artery without angina pectoris: Secondary | ICD-10-CM | POA: Diagnosis not present

## 2022-04-14 DIAGNOSIS — G4733 Obstructive sleep apnea (adult) (pediatric): Secondary | ICD-10-CM | POA: Diagnosis not present

## 2022-04-14 DIAGNOSIS — E668 Other obesity: Secondary | ICD-10-CM | POA: Diagnosis not present

## 2022-04-14 DIAGNOSIS — E1159 Type 2 diabetes mellitus with other circulatory complications: Secondary | ICD-10-CM | POA: Diagnosis not present

## 2022-04-14 DIAGNOSIS — I10 Essential (primary) hypertension: Secondary | ICD-10-CM | POA: Diagnosis not present

## 2022-04-14 DIAGNOSIS — E782 Mixed hyperlipidemia: Secondary | ICD-10-CM | POA: Diagnosis not present

## 2022-04-15 DIAGNOSIS — M6688 Spontaneous rupture of other tendons, other: Secondary | ICD-10-CM | POA: Diagnosis not present

## 2022-04-15 DIAGNOSIS — G8918 Other acute postprocedural pain: Secondary | ICD-10-CM | POA: Diagnosis not present

## 2022-04-15 DIAGNOSIS — S76112A Strain of left quadriceps muscle, fascia and tendon, initial encounter: Secondary | ICD-10-CM | POA: Diagnosis not present

## 2022-04-16 ENCOUNTER — Encounter: Payer: Self-pay | Admitting: Orthopaedic Surgery

## 2022-04-27 ENCOUNTER — Ambulatory Visit: Payer: Federal, State, Local not specified - PPO | Admitting: Internal Medicine

## 2022-04-28 DIAGNOSIS — S76112D Strain of left quadriceps muscle, fascia and tendon, subsequent encounter: Secondary | ICD-10-CM | POA: Diagnosis not present

## 2022-05-04 ENCOUNTER — Encounter: Payer: Self-pay | Admitting: Nurse Practitioner

## 2022-05-05 DIAGNOSIS — E119 Type 2 diabetes mellitus without complications: Secondary | ICD-10-CM | POA: Diagnosis not present

## 2022-05-05 DIAGNOSIS — H524 Presbyopia: Secondary | ICD-10-CM | POA: Diagnosis not present

## 2022-05-05 DIAGNOSIS — H5203 Hypermetropia, bilateral: Secondary | ICD-10-CM | POA: Diagnosis not present

## 2022-05-05 DIAGNOSIS — H52223 Regular astigmatism, bilateral: Secondary | ICD-10-CM | POA: Diagnosis not present

## 2022-05-15 DIAGNOSIS — G4733 Obstructive sleep apnea (adult) (pediatric): Secondary | ICD-10-CM | POA: Diagnosis not present

## 2022-05-26 DIAGNOSIS — S76112D Strain of left quadriceps muscle, fascia and tendon, subsequent encounter: Secondary | ICD-10-CM | POA: Diagnosis not present

## 2022-05-27 ENCOUNTER — Ambulatory Visit: Payer: Federal, State, Local not specified - PPO | Admitting: Nurse Practitioner

## 2022-06-01 ENCOUNTER — Ambulatory Visit (INDEPENDENT_AMBULATORY_CARE_PROVIDER_SITE_OTHER): Payer: Federal, State, Local not specified - PPO | Admitting: Internal Medicine

## 2022-06-01 ENCOUNTER — Encounter: Payer: Self-pay | Admitting: Internal Medicine

## 2022-06-01 VITALS — BP 144/88 | HR 68 | Ht 69.0 in | Wt 247.2 lb

## 2022-06-01 DIAGNOSIS — E669 Obesity, unspecified: Secondary | ICD-10-CM | POA: Diagnosis not present

## 2022-06-01 DIAGNOSIS — E1159 Type 2 diabetes mellitus with other circulatory complications: Secondary | ICD-10-CM

## 2022-06-01 DIAGNOSIS — E785 Hyperlipidemia, unspecified: Secondary | ICD-10-CM

## 2022-06-01 LAB — POCT GLYCOSYLATED HEMOGLOBIN (HGB A1C): Hemoglobin A1C: 6 % — AB (ref 4.0–5.6)

## 2022-06-01 MED ORDER — GLIPIZIDE ER 5 MG PO TB24: 5.0000 mg | ORAL_TABLET | Freq: Two times a day (BID) | ORAL | 3 refills | Status: DC

## 2022-06-01 MED ORDER — SEMAGLUTIDE(0.25 OR 0.5MG/DOS) 2 MG/3ML ~~LOC~~ SOPN: PEN_INJECTOR | SUBCUTANEOUS | 3 refills | Status: DC

## 2022-06-01 NOTE — Patient Instructions (Addendum)
Please continue: - Metformin 1000 mg 2x a day - Glipizide XL 5 mg 2x a day - break/crush the evening dose - Ozempic 0.5 mg weekly  Start check sugars 1x a day at different times of the day.  Let me know if ou have any lows <70.  Please come back for a follow-up appointment in 4 months.

## 2022-06-01 NOTE — Progress Notes (Unsigned)
Patient ID: [name], male   DOB: March 03, 1951, 72 y.o.   MRN: [id number]  HPI: [name] is a 72 y.o.-year-old male, returning for f/u for DM2, dx 2007, non insulin-dependent (previously on insulin since 2014), uncontrolled, with complications (CAD - s/p NSTEMI 11/2014 - stent; ED; mild CKD). Last visit 4 months ago. PCP: Dr [name]  Interim history: No increased urination, blurry vision, nausea, chest pain. He had a vein busting on the medial side of his R leg >> healed now. She had knee surgery x 2. He is less active as he still has knee pain.  He changed his diet >> more veggies.   Reviewed HbA1c levels: Lab Results  Component Value Date   HGBA1C 7.0 (H) 12/10/2021   HGBA1C 7.0 (A) 08/06/2021   HGBA1C 7.0 (A) 04/07/2021   Pt is on a regimen of: - Metformin 1000 mg 2x a day - Glipizide XL 5 mg 2x a day -crushes the evening dose - Ozempic 0.5 mg weekly >> GERD >> restarted 08/2019 at 0.25 mg weekly >> 0.5 mg - off for last month He came off Levemir 20 units at bedtime before our visit in 09/2020 after he ran out and could not refill it He tried Toujeo 20 units at night but did not like it He did not start Januvia as suggested at last visit ("I don't think I need it"). He was Levemir 140 units in am >> decreased to 70 units >> stopped as this was too expensive for him He stopped Welchol 3.7g at bedtime a day. Stopped Farxiga 10 mg b/c frequent urination and urgency. He was on Metformin in the past >> no N/V.   Pt checks his sugars once a day - not checking now. From last OV: - am:  98-100 >> 120-130 >> 90-120 >> 98-130 >> 79, 89-112, 152  - 2h after b'fast: n/c >> 128, 134 >> n/c - before lunch: n/c >> 140s >> n/c >> 100-120 >> n/c - 2h after lunch: n/c >> 140-157 >> n/c >> 130s >> n/c - before dinner:  106-120 >> n/c >> 80-130 >> n/c - 2h after dinner: 150-180, 200 >> n/c >> up to 160s >> n/c - bedtime:   180-200 >> 230-240 >> 137-140 >> 150 >> n/c - nighttime:94,  190-305 >> 148-216 >> n/c  Lowest sugar was 23!!! when took Ozempic every day by mistake >> ... 79 >> ?; he has hypoglycemia awareness in the 80s Highest sugar was 200 x1 >> 170 >> 175 >> 152 >> ?.  -+ Mild CKD, last BUN/creatinine:  Lab Results  Component Value Date   BUN 17 12/02/2021   CREATININE 1.03 12/02/2021  09/07/2016: 13/0.9, glucose 133 On losartan.  -+ HL; last set of lipids: Lab Results  Component Value Date   CHOL 179 06/04/2021   HDL 41.30 06/04/2021   LDLCALC 126 (H) 06/04/2021   LDLDIRECT 131 (H) 03/22/2019   TRIG 59.0 06/04/2021   CHOLHDL 4 06/04/2021  09/07/2016: 167/65/33/121 He is on pravastatin inconsistently - every 2-3 days - now off for last week due to joint pains.  Previous muscle weakness with statins. He stopped Repatha due to muscle weakness.  - last eye exam was in 04/2022: No DR reportedly; Dr. Lady Brock.   - He denies  numbness and tingling in his feet.  Pt was admitted for CP in 11/2014: NSTEMI >> CAD >> PTCA >> stent placed.  He was in ED with CP 05/2017 >> no cardiac pathology.  He had shoulder surgery 01/18/2019 after rupture of his supraspinatus tendon. In 2020, he had radiotherapy for his prostate cancer (with gold beads).    ROS: + see HPI  I reviewed pt's medications, allergies, PMH, social hx, family hx, and changes were documented in the history of present illness. Otherwise, unchanged from my initial visit note.  Past Medical History:  Diagnosis Date   Adenomatous colon polyp 2011   Anemia    Angina pectoris ([place]) 05/01/2015   med rx for 95% OM (u/a to access due to tortuosity), 95% D1 and other moderate CAD at cath   Arthritis    "back, knees" (05/01/2015)   Chest pain    pleuritic   Chronic lower back pain    Complex tear of medial meniscus of left knee 09/14/2018   Coronary artery disease    Depression    GERD (gastroesophageal reflux disease)    HTN (hypertension)    Hyperlipidemia    Obstructive sleep apnea     noncompliant with CPAP   Osteoarthritis    Pneumonia ~ 2014 X 1   Prostate cancer ([place])    Refusal of blood transfusions as patient is Jehovah's Witness    Rotator cuff arthropathy, right    Rupture of right supraspinatus tendon 01/18/2019   Type II or unspecified type diabetes mellitus without mention of complication, not stated as uncontrolled    Past Surgical History:  Procedure Laterality Date   CARDIAC CATHETERIZATION  04/05/2002   patent coronary arteries   CARDIAC CATHETERIZATION  05/01/2015   "tried to put stent in but couldn't"   CARDIAC CATHETERIZATION N/A 05/01/2015   Procedure: Left Heart Cath and Coronary Angiography;  Surgeon: Ronald Crome, MD; LAD 60%, oD1 90%, pD1 70%, D2 70%, CFX patent stent, 30% distal to prev stent, pRCA 20%, OM1 90/95%, NL LV   CARDIAC CATHETERIZATION N/A 05/01/2015   Procedure: Coronary Stent Intervention;  Surgeon: Ronald Crome, MD;  Unsuccessful PCI OM due to tortuosity   CARDIAC CATHETERIZATION N/A 11/06/2014   Procedure: Left Heart Cath and Coronary Angiography;  Surgeon: Ronald Crome, MD;  Location: [facility];  Service: Cardiovascular;  Laterality: N/A;   CLOSED REDUCTION SHOULDER DISLOCATION Right ~ 1975   "& reattached muscle"   COLONOSCOPY W/ BIOPSIES AND POLYPECTOMY  X 2   ESOPHAGOGASTRODUODENOSCOPY ENDOSCOPY     KNEE ARTHROSCOPY WITH MEDIAL MENISECTOMY Left 09/14/2018   Procedure: LEFT KNEE ARTHROSCOPY CHONDROPLASY,  WITH MEDIAL MENISECTOMY;  Surgeon: Ronald Bond, MD;  Location: [facility];  Service: Orthopedics;  Laterality: Left;   KNEE SURGERY Left 11/17/2021   PILONIDAL CYST EXCISION     SHOULDER ARTHROSCOPY WITH DEBRIDEMENT AND BICEP TENDON REPAIR Right 01/18/2019   Procedure: RIGHT SHOULDER ARTHROSCOPY WITH DEBRIDEMENT AND DECOMPRSSION SUBACROMIAL PARTIAL ACROMIOPLASTY;  Surgeon: Ronald Bond, MD;  Location: [facility];  Service: Orthopedics;  Laterality: Right;   SHOULDER ARTHROSCOPY  WITH ROTATOR CUFF REPAIR Right 01/18/2019   Procedure: SHOULDER ARTHROSCOPY WITH ROTATOR CUFF REPAIR;  Surgeon: Ronald Bond, MD;  Location: [facility];  Service: Orthopedics;  Laterality: Right;   Social History   Socioeconomic History   Marital status: Married    Spouse name: [name]   Number of children: 4   Years of education: Not on file   Highest education level: High school graduate  Occupational History   Occupation: Retired    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   Social Determinants of Health   Financial Resource Strain: Not on file  Food Insecurity: Not on file  Transportation Needs: No Transportation Needs (06/08/2018)   PRAPARE - Hydrologist (Medical): No    Lack of Transportation (Non-Medical): No  Physical Activity: Not on file  Stress: Not on file  Social Connections: Not on file  Intimate Partner Violence: Not At Risk (06/08/2018)   Humiliation, Afraid, Rape, and Kick questionnaire    Fear of Current or Ex-Partner: No    Emotionally Abused: No    Physically Abused: No    Sexually Abused: No   Current Outpatient Medications on File Prior to Visit  Medication Sig Dispense Refill   albuterol (VENTOLIN  HFA) 108 (90 Base) MCG/ACT inhaler INHALE 1-2 PUFFS INTO THE LUNGS EVERY 4 HOURS AS NEEDED FOR WHEEZING OR SHORTNESS OF BREATH. 18 each 2   aspirin EC 81 MG tablet Take 81 mg by mouth daily. Swallow whole.     Blood Glucose Monitoring Suppl (RELION PREMIER BLU MONITOR) DEVI 1 Device by Does not apply route daily. 1 Device 0   glipiZIDE (GLUCOTROL XL) 5 MG 24 hr tablet TAKE 1 TABLET BY MOUTH TWICE A DAY 180 tablet 3   glucose blood test strip Use as instructed to check sugar 2 times daily 200 each 5   hydrALAZINE (APRESOLINE) 50 MG tablet TAKE 1 TABLET BY MOUTH THREE TIMES A DAY 270 tablet 3   hydrochlorothiazide (HYDRODIURIL) 25 MG tablet TAKE 1 TABLET BY MOUTH EVERY DAY 90 tablet 3   HYDROcodone-acetaminophen (NORCO) 5-325 MG tablet Take 1 tablet by mouth 2 (two) times daily as needed. 20 tablet 0   losartan (COZAAR) 100 MG tablet TAKE 1 TABLET BY MOUTH EVERY DAY 90 tablet 3   metFORMIN (GLUCOPHAGE) 500 MG tablet TAKE 2 TABLETS (1,000 MG TOTAL) BY MOUTH 2 (TWO) TIMES DAILY WITH A MEAL. 360 tablet 3   methocarbamol (ROBAXIN-750) 750 MG tablet Take 1 tablet (750 mg total) by mouth 2 (two) times daily as needed for muscle spasms. 20 tablet 2   metoprolol tartrate (LOPRESSOR) 50 MG tablet Take 1 tablet (50 mg total) by mouth 2 (two) times daily. 180 tablet 3   nitroGLYCERIN (NITROSTAT) 0.4 MG SL tablet PLACE 1 TABLET UNDER THE TONGUE EVERY 5 MIN X 3 DOSES AS NEEDED FOR CHEST PAIN 75 tablet 2   tiZANidine (ZANAFLEX) 4 MG tablet Take 1 tablet (4 mg total) by mouth 2 (two) times daily as needed for muscle spasms. 20 tablet 0   No current facility-administered medications on file prior to visit.   Allergies  Allergen Reactions   Statins Other (See Comments)    REACTION: joint pain  Lipitor- headaches Has also tried Livalo, pravachol, zetia, crestor, welchol   Imdur [Isosorbide Dinitrate]     Headache - "violent"   Repatha [Evolocumab]     Muscle weakness and soreness   Family History  Problem  Relation Age of Onset   Coronary artery disease Other        CABG   Coronary artery disease Mother    Stroke Mother    Heart disease Mother    Diabetes Mother    Other Father    Diabetes Sister    Diabetes Maternal Uncle        x2   Alcohol abuse Other    Diabetes Paternal Uncle        x2   Colon cancer Paternal Uncle    Colon polyps Neg Hx    Esophageal cancer Neg Hx    PE: BP (!) 144/88 (BP Location: Left Arm, Patient Position: Sitting, Cuff Size: Normal)   Pulse 68   Ht '5\' 9"'$  (1.753 m)   Wt 247 lb 3.2 oz (112.1 kg)   SpO2 98%   BMI 36.51 kg/m   Wt Readings from Last 3 Encounters:  06/01/22 247 lb 3.2 oz (112.1 kg)  12/10/21 238 lb 12.8 oz (108.3 kg)  11/09/21 230 lb (104.3 kg)   Constitutional: overweight, in NAD Eyes: PERRLA, EOMI, no exophthalmos ENT: moist mucous membranes, no thyromegaly, no cervical lymphadenopathy Cardiovascular: RRR, No MRG Respiratory: CTA B Musculoskeletal: no deformities, strength intact in all 4 Skin: moist, warm, dark erythematous 1 cm macula on the medial side of right lower leg; also, stasis dermatitis Neurological: no tremor with outstretched hands, DTR normal in all 4  ASSESSMENT: 1. DM2, prev. insulin-dependent, uncontrolled, with complications - CAD, s/p NSTEMI, s/p stent 11/2014 - ED - mild CKD  2. Obesity class II BMI Classification: < 18.5 underweight  18.5-24.9 normal weight  25.0-29.9 overweight  30.0-34.9 class I obesity  35.0-39.9 class II obesity  ? 40.0 class III obesity   3. HL  PLAN:  1. Patient with uncontrolled type 2 diabetes, on oral antidiabetic regimen with metformin and sulfonylurea and weekly GLP-1 receptor agonist.  He was previously also on long-acting insulin but was able to come off while on the GLP-1 receptor agonist.  Sugars remained controlled so we did not have to restart it.  We could not use a higher dose of Ozempic due to previous GI symptoms.  At last visit, he was checking sugars mostly in  the morning and they were in target range with only few exceptions. -At last visit, his time and date is off on his meter so it was difficult to understand patterns, we just went by his recall.  I advised him to try to set that time and date right.  However, at this visit, he does not bring his meter and he is not checking blood sugars.  I strongly advised him to start.  Also, Ozempic is not on his medication list anymore and he is quite vague about whether he is taking it or not.  Per my understanding, he was taking it and ran out approximately 1 month ago.  For now, in the light of the excellent HbA1c, I advised him to continue the current regimen.  However, I am worried about possible hypoglycemia so I advised him that after he starts checking, if he has low blood sugars, to let me know, so we can reduce his glipizide dose.  For now, I refilled the same dose for him. -  I suggested to:  Patient Instructions  Please continue: - Metformin 1000 mg 2x a day - Glipizide XL 5 mg 2x a day - break/crush the evening dose - Ozempic 0.5 mg weekly  Start check sugars 1x a day at different times of the day.  Let me know if you have any lows <70.  Please come back for a follow-up appointment in 4 months.  - we checked his HbA1c: 6.0% (lower) - advised to check sugars at different times of the day - 1x a day, rotating check times - advised for yearly eye exams >> he is UTD - will check annual labs today per his preference - return to clinic in 3-4 months  2. Obesity class 2 -Will continue Ozempic which should also help with weight loss -He lost net 2 pounds before last visit, now lost net 8 lbs   3. HL -Reviewed the latest lipid panel from 06/2021: LDL above target, the rest the fractions at goal: Lab Results  Component Value Date   CHOL 179 06/04/2021   HDL 41.30 06/04/2021   LDLCALC 126 (H) 06/04/2021   LDLDIRECT 131 (H) 03/22/2019   TRIG 59.0 06/04/2021   CHOLHDL 4 06/04/2021   -Unfortunately, he could not tolerate statins or Repatha due to muscle weakness -I advised him to retry pravastatin every second or even third day >> he is off.  -Will check his lipid panel today  [name], MD PhD [facility] Endocrinology

## 2022-06-02 LAB — LIPID PANEL
Cholesterol: 177 mg/dL (ref 0–200)
HDL: 42.7 mg/dL (ref >39.00)
LDL Cholesterol: 111 mg/dL — ABNORMAL HIGH (ref 0–99)
NonHDL: 134.73
Total CHOL/HDL Ratio: 4
Triglycerides: 120 mg/dL (ref 0.0–149.0)
VLDL: 24 mg/dL (ref 0.0–40.0)

## 2022-06-02 LAB — COMPREHENSIVE METABOLIC PANEL
ALT: 14 U/L (ref 0–53)
AST: 19 U/L (ref 0–37)
Albumin: 4 g/dL (ref 3.5–5.2)
Alkaline Phosphatase: 108 U/L (ref 39–117)
BUN: 15 mg/dL (ref 6–23)
CO2: 31 mEq/L (ref 19–32)
Calcium: 9.8 mg/dL (ref 8.4–10.5)
Chloride: 106 mEq/L (ref 96–112)
Creatinine, Ser: 1.07 mg/dL (ref 0.40–1.50)
GFR: 69.44 mL/min (ref >60.00)
Glucose, Bld: 70 mg/dL (ref 70–99)
Potassium: 4.3 mEq/L (ref 3.5–5.1)
Sodium: 142 mEq/L (ref 135–145)
Total Bilirubin: 0.3 mg/dL (ref 0.2–1.2)
Total Protein: 7.4 g/dL (ref 6.0–8.3)

## 2022-06-02 LAB — MICROALBUMIN / CREATININE URINE RATIO
Creatinine,U: 142.7 mg/dL
Microalb Creat Ratio: 1.5 mg/g (ref 0.0–30.0)
Microalb, Ur: 2.1 mg/dL — ABNORMAL HIGH (ref 0.0–1.9)

## 2022-06-13 DIAGNOSIS — G4733 Obstructive sleep apnea (adult) (pediatric): Secondary | ICD-10-CM | POA: Diagnosis not present

## 2022-06-23 DIAGNOSIS — S76112D Strain of left quadriceps muscle, fascia and tendon, subsequent encounter: Secondary | ICD-10-CM | POA: Diagnosis not present

## 2022-07-01 ENCOUNTER — Telehealth: Payer: Self-pay | Admitting: *Deleted

## 2022-07-01 ENCOUNTER — Other Ambulatory Visit: Payer: Self-pay | Admitting: *Deleted

## 2022-07-01 MED ORDER — TRULICITY 1.5 MG/0.5ML ~~LOC~~ SOAJ: 1.5000 mg | SUBCUTANEOUS | 5 refills | Status: DC

## 2022-07-01 NOTE — Telephone Encounter (Signed)
Sent and notified pt.

## 2022-07-01 NOTE — Telephone Encounter (Signed)
Pt called--stated Ozempic making pt hungry and questioning if can change to trulicity. Please advise

## 2022-07-12 ENCOUNTER — Other Ambulatory Visit: Payer: Self-pay | Admitting: *Deleted

## 2022-07-12 MED ORDER — HYDROCHLOROTHIAZIDE 25 MG PO TABS: 25.0000 mg | ORAL_TABLET | Freq: Every day | ORAL | 0 refills | Status: DC

## 2022-07-21 ENCOUNTER — Telehealth: Payer: Self-pay | Admitting: Interventional Cardiology

## 2022-07-21 DIAGNOSIS — M1711 Unilateral primary osteoarthritis, right knee: Secondary | ICD-10-CM | POA: Diagnosis not present

## 2022-07-21 NOTE — Telephone Encounter (Signed)
   Name: [name]  DOB: [birth year]  MRN: [id number]  Primary Cardiologist: None  Chart reviewed as part of pre-operative protocol coverage. Because of [name]'s past medical history and time since last visit, he will require a follow-up in-office visit in order to better assess preoperative cardiovascular risk.  Pre-op covering staff: - Please schedule appointment and call patient to inform them. If patient already had an upcoming appointment within acceptable timeframe, please add "pre-op clearance" to the appointment notes so provider is aware. - Please contact requesting surgeon's office via preferred method (i.e, phone, fax) to inform them of need for appointment prior to surgery.  History of remote stenting back in 2016.  If asymptomatic at the time of his in person appointment, can hold aspirin x 5 to 7 days prior to procedure and restart when medically safe to do so.  Sharlene Dory, PA-C  07/21/2022, 3:46 PM

## 2022-07-21 NOTE — Telephone Encounter (Signed)
Patient scheduled to see Dr. Anne Fu on 08/26/22 for preop clearance. Requesting office made aware

## 2022-07-21 NOTE — Telephone Encounter (Signed)
   Pre-operative Risk Assessment    Patient Name: [name]  DOB: 07-18-1950 MRN: [id number]      Request for Surgical Clearance    Procedure:   Right Total Knee Replacement  Date of Surgery:  Clearance TBD                                 Surgeon:  Dr. Ivin Booty [place] [facility] or Practice Name:  [facility] Phone number:  [phone]  Fax number:  [phone]   Type of Clearance Requested:   - Medical  - Pharmacy:  Hold Aspirin TBD by cardiologist   Type of Anesthesia:  Spinal   Additional requests/questions:  Please fax a copy of medical clearance to the surgeon's office.  [name]   07/21/2022, 3:31 PM

## 2022-07-23 DIAGNOSIS — R3915 Urgency of urination: Secondary | ICD-10-CM | POA: Diagnosis not present

## 2022-07-23 DIAGNOSIS — N401 Enlarged prostate with lower urinary tract symptoms: Secondary | ICD-10-CM | POA: Diagnosis not present

## 2022-07-23 DIAGNOSIS — C61 Malignant neoplasm of prostate: Secondary | ICD-10-CM | POA: Diagnosis not present

## 2022-07-28 ENCOUNTER — Telehealth: Payer: Self-pay | Admitting: Family Medicine

## 2022-07-28 DIAGNOSIS — S76122D Laceration of left quadriceps muscle, fascia and tendon, subsequent encounter: Secondary | ICD-10-CM | POA: Diagnosis not present

## 2022-07-29 ENCOUNTER — Ambulatory Visit (INDEPENDENT_AMBULATORY_CARE_PROVIDER_SITE_OTHER): Payer: Federal, State, Local not specified - PPO | Admitting: Family Medicine

## 2022-07-29 ENCOUNTER — Encounter: Payer: Self-pay | Admitting: Family Medicine

## 2022-07-29 VITALS — BP 144/82 | HR 75 | Temp 97.8°F | Ht 68.0 in | Wt 249.4 lb

## 2022-07-29 DIAGNOSIS — I10 Essential (primary) hypertension: Secondary | ICD-10-CM

## 2022-07-29 NOTE — Progress Notes (Signed)
Established Patient Office Visit  Subjective   Patient ID: [name], male    DOB: 11/15/1950  Age: 72 y.o. MRN: [id number]  Chief Complaint  Patient presents with   Establish Care    Pt is here for TOC. Was seeing Dr. Veto Kemps and is transferring to me today,  Pt reports he is planning his left knee replacement over the summer. Has a form for pre-operative evaluation that he would like to have filled out today. He reports that his main concern is his blood pressure. He reports he hasn't really been taking his hydralazine three times a day. States that it is "always high". He has been checking it at home as well. He reports he used to be on amlodipine but this caused fairly significant swelling in his legs. He denies any SOB, headaches, or chest pain. He has an appointment with the cardiologist for cardiac clearance for his knee replacement surgery. I advised he speak with the cardiologist about his blood pressure as well.   I have reviewed all aspects of the patient's medical history including social, family, and surgical history.   DM-- pt sees Dr. Elvera Lennox for management of this problem.      Current Outpatient Medications  Medication Instructions   albuterol (VENTOLIN HFA) 108 (90 Base) MCG/ACT inhaler INHALE 1-2 PUFFS INTO THE LUNGS EVERY 4 HOURS AS NEEDED FOR WHEEZING OR SHORTNESS OF BREATH.   aspirin EC 81 mg, Oral, Daily, Swallow whole.    Blood Glucose Monitoring Suppl (RELION PREMIER BLU MONITOR) DEVI 1 Device, Does not apply, Daily   glipiZIDE (GLUCOTROL XL) 5 mg, Oral, 2 times daily   glucose blood test strip Use as instructed to check sugar 2 times daily   hydrALAZINE (APRESOLINE) 50 mg, Oral, 3 times daily   hydrochlorothiazide (HYDRODIURIL) 25 mg, Oral, Daily   losartan (COZAAR) 100 MG tablet TAKE 1 TABLET BY MOUTH EVERY DAY   metFORMIN (GLUCOPHAGE) 500 MG tablet Oral, 2 times daily with meals   metoprolol tartrate (LOPRESSOR) 50 mg, Oral, 2 times daily    nitroGLYCERIN (NITROSTAT) 0.4 MG SL tablet PLACE 1 TABLET UNDER THE TONGUE EVERY 5 MIN X 3 DOSES AS NEEDED FOR CHEST PAIN   pravastatin (PRAVACHOL) 40 mg, Oral, Daily   Semaglutide,0.25 or 0.5MG /DOS, 2 MG/3ML SOPN Inject 0.5 mg under skin weekly   tiZANidine (ZANAFLEX) 4 mg, Oral, 2 times daily PRN    Patient Active Problem List   Diagnosis Date Noted   History of prostate cancer 12/10/2021   Urge incontinence 12/10/2021   Traumatic rupture of left quadriceps tendon 09/30/2021   Acute non-ST elevation myocardial infarction (NSTEMI) ([facility]) 05/04/2019   Statin myopathy 04/04/2019   Rupture of right supraspinatus tendon 01/18/2019   Complex tear of medial meniscus of left knee 09/14/2018   Refusal of blood transfusions as patient is Jehovah's Witness    Osteoarthritis    GERD (gastroesophageal reflux disease)    Depression    Chronic lower back pain    Anemia    Essential hypertension 01/03/2017   PAT (paroxysmal atrial tachycardia) 10/27/2015   Type 2 diabetes mellitus with circulatory disorder, without long-term current use of insulin ([facility]CC) 06/19/2015   Angina pectoris ([facility])    CAD (coronary artery disease)    Obstructive sleep apnea 09/06/2014   Prostatitis, acute 12/13/2013   Insomnia 04/19/2013   Obesity with body mass index of 30.0-39.9 10/18/2012   Right lumbar radiculitis 09/29/2011   Adenomatous colon polyp 04/05/2009   Erectile dysfunction 11/11/2008  Hyperlipidemia LDL goal <70 08/23/2008   Benign prostatic hyperplasia with urinary obstruction 08/23/2008      Review of Systems  All other systems reviewed and are negative.     Objective:     BP (!) 144/82 (BP Location: Right Arm, Patient Position: Sitting, Cuff Size: Large)   Pulse 75   Temp 97.8 F (36.6 C) (Oral)   Ht 5\' 8"  (1.727 m)   Wt 249 lb 6.4 oz (113.1 kg)   SpO2 98%   BMI 37.92 kg/m  BP Readings from Last 3 Encounters:  07/29/22 (!) 144/82  06/01/22 (!) 144/88  12/10/21 (!) 146/80       Physical Exam Vitals reviewed.  Constitutional:      Appearance: Normal appearance. He is well-groomed. He is obese.  Eyes:     Extraocular Movements: Extraocular movements intact.     Conjunctiva/sclera: Conjunctivae normal.  Neck:     Thyroid: No thyromegaly.  Cardiovascular:     Rate and Rhythm: Normal rate and regular rhythm.     Heart sounds: S1 normal and S2 normal. No murmur heard. Pulmonary:     Effort: Pulmonary effort is normal.     Breath sounds: Normal breath sounds and air entry. No rales.  Abdominal:     General: Bowel sounds are normal.  Musculoskeletal:     Right lower leg: No edema.     Left lower leg: No edema.  Neurological:     General: No focal deficit present.     Mental Status: He is alert and oriented to person, place, and time.     Gait: Gait is intact.  Psychiatric:        Mood and Affect: Mood and affect normal.      No results found for any visits on 07/29/22.  Last metabolic panel Lab Results  Component Value Date   GLUCOSE 70 06/01/2022   NA 142 06/01/2022   K 4.3 06/01/2022   CL 106 06/01/2022   CO2 31 06/01/2022   BUN 15 06/01/2022   CREATININE 1.07 06/01/2022   GFRNONAA >60 12/02/2021   CALCIUM 9.8 06/01/2022   PROT 7.4 06/01/2022   ALBUMIN 4.0 06/01/2022   LABGLOB 2.9 04/30/2020   AGRATIO 1.5 04/30/2020   BILITOT 0.3 06/01/2022   ALKPHOS 108 06/01/2022   AST 19 06/01/2022   ALT 14 06/01/2022   ANIONGAP 9 12/02/2021   Last lipids Lab Results  Component Value Date   CHOL 177 06/01/2022   HDL 42.70 06/01/2022   LDLCALC 111 (H) 06/01/2022   LDLDIRECT 131 (H) 03/22/2019   TRIG 120.0 06/01/2022   CHOLHDL 4 06/01/2022   Last hemoglobin A1c Lab Results  Component Value Date   HGBA1C 6.0 (A) 06/01/2022      The ASCVD Risk score (Arnett DK, et al., 2019) failed to calculate for the following reasons:   The patient has a prior MI or stroke diagnosis    Assessment & Plan:  Essential hypertension Assessment &  Plan: BP chronically uncontrolled. I advised that we should get a renal artery duplex to rule out RAS. Pt admits that he hasn't really be taking the hydralazine TID, I strongly advised that he take this medication TID as prescribed. Further recommendations TBD after the [place] results are known. Will continue his current medications at this time. I will see him after he has his knee replacement later this summer.   Current hypertension medications:       Sig   hydrALAZINE (APRESOLINE) 50 MG tablet (  Taking) TAKE 1 TABLET BY MOUTH THREE TIMES A DAY   hydrochlorothiazide (HYDRODIURIL) 25 MG tablet (Taking) Take 1 tablet (25 mg total) by mouth daily.   losartan (COZAAR) 100 MG tablet (Taking) TAKE 1 TABLET BY MOUTH EVERY DAY   metoprolol tartrate (LOPRESSOR) 50 MG tablet (Taking) Take 1 tablet (50 mg total) by mouth 2 (two) times daily.        Orders: -     RENAL ARTERY [place] BILATERAL (BACK OFFICE); Future     Return in about 6 months (around 01/28/2023) for HTN.    Karie Georges, MD

## 2022-07-29 NOTE — Telephone Encounter (Signed)
error 

## 2022-07-30 ENCOUNTER — Other Ambulatory Visit: Payer: Self-pay

## 2022-07-30 DIAGNOSIS — I10 Essential (primary) hypertension: Secondary | ICD-10-CM

## 2022-07-30 NOTE — Assessment & Plan Note (Signed)
BP chronically uncontrolled. I advised that we should get a renal artery duplex to rule out RAS. Pt admits that he hasn't really be taking the hydralazine TID, I strongly advised that he take this medication TID as prescribed. Further recommendations TBD after the [place] results are known. Will continue his current medications at this time. I will see him after he has his knee replacement later this summer.   Current hypertension medications:       Sig   hydrALAZINE (APRESOLINE) 50 MG tablet (Taking) TAKE 1 TABLET BY MOUTH THREE TIMES A DAY   hydrochlorothiazide (HYDRODIURIL) 25 MG tablet (Taking) Take 1 tablet (25 mg total) by mouth daily.   losartan (COZAAR) 100 MG tablet (Taking) TAKE 1 TABLET BY MOUTH EVERY DAY   metoprolol tartrate (LOPRESSOR) 50 MG tablet (Taking) Take 1 tablet (50 mg total) by mouth 2 (two) times daily.

## 2022-08-03 ENCOUNTER — Telehealth: Payer: Self-pay | Admitting: Family Medicine

## 2022-08-03 NOTE — Telephone Encounter (Signed)
Pt call and stated he want to know if dr. Casimiro Needle put in any appt for him about his kidney and want a call back .

## 2022-08-05 NOTE — Telephone Encounter (Signed)
Spoke with the patient and informed him of the message below.  Patient stated he has not had an [place] on 4/25 as stated below.  Message sent to referral coordinator for assistance.

## 2022-08-06 DIAGNOSIS — S76122D Laceration of left quadriceps muscle, fascia and tendon, subsequent encounter: Secondary | ICD-10-CM | POA: Diagnosis not present

## 2022-08-09 NOTE — Telephone Encounter (Signed)
Our office received a duplicate request. Pt has appt with Dr. Anne Fu 08/26/22 for pre op clearance. Once the pt has been cleared, Dr. Anne Fu will have his notes faxed over with clearance and any medication recommendations. I will update all parties involved.

## 2022-08-12 DIAGNOSIS — S76122D Laceration of left quadriceps muscle, fascia and tendon, subsequent encounter: Secondary | ICD-10-CM | POA: Diagnosis not present

## 2022-08-19 DIAGNOSIS — S76122D Laceration of left quadriceps muscle, fascia and tendon, subsequent encounter: Secondary | ICD-10-CM | POA: Diagnosis not present

## 2022-08-26 ENCOUNTER — Encounter: Payer: Self-pay | Admitting: Cardiology

## 2022-08-26 ENCOUNTER — Ambulatory Visit: Payer: Federal, State, Local not specified - PPO | Attending: Cardiology | Admitting: Cardiology

## 2022-08-26 VITALS — BP 146/78 | HR 61 | Ht 69.0 in | Wt 239.2 lb

## 2022-08-26 DIAGNOSIS — T466X5A Adverse effect of antihyperlipidemic and antiarteriosclerotic drugs, initial encounter: Secondary | ICD-10-CM | POA: Diagnosis not present

## 2022-08-26 DIAGNOSIS — I2510 Atherosclerotic heart disease of native coronary artery without angina pectoris: Secondary | ICD-10-CM

## 2022-08-26 DIAGNOSIS — G4733 Obstructive sleep apnea (adult) (pediatric): Secondary | ICD-10-CM | POA: Diagnosis not present

## 2022-08-26 DIAGNOSIS — G720 Drug-induced myopathy: Secondary | ICD-10-CM

## 2022-08-26 DIAGNOSIS — Z01818 Encounter for other preprocedural examination: Secondary | ICD-10-CM

## 2022-08-26 DIAGNOSIS — S76122D Laceration of left quadriceps muscle, fascia and tendon, subsequent encounter: Secondary | ICD-10-CM | POA: Diagnosis not present

## 2022-08-26 NOTE — Patient Instructions (Signed)
Medication Instructions:  No changes *If you need a refill on your cardiac medications before your next appointment, please call your pharmacy*   Lab Work: none If you have labs (blood work) drawn today and your tests are completely normal, you will receive your results only by: MyChart Message (if you have MyChart) OR A paper copy in the mail If you have any lab test that is abnormal or we need to change your treatment, we will call you to review the results.   Testing/Procedures: none   Follow-Up: At [facility], you and your health needs are our priority.  As part of our continuing mission to provide you with exceptional heart care, we have created designated Provider Care Teams.  These Care Teams include your primary Cardiologist (physician) and Advanced Practice Providers (APPs -  Physician Assistants and Nurse Practitioners) who all work together to provide you with the care you need, when you need it.   Your next appointment:   12 month(s)  Provider:   Mark Skains, MD      

## 2022-08-26 NOTE — Progress Notes (Signed)
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 CARDIAC: RRR, no murmurs, rubs, gallops RESPIRATORY:  Clear to auscultation without rales, wheezing or rhonchi  ABDOMEN: Soft, non-tender, non-distended MUSCULOSKELETAL:  No edema; No deformity, limp SKIN: Warm and dry NEUROLOGIC:  Alert and oriented x 3 PSYCHIATRIC:  Normal affect  ASSESSMENT:    1. Pre-operative clearance   2. Coronary artery disease involving native coronary artery of native heart without angina pectoris   3. Obstructive sleep apnea   4. Statin  myopathy    PLAN:    In order of problems listed above:  Preop cardiac risk -He may proceed with right knee surgery with low to moderate overall cardiac risk based upon his prior CAD.  Nuclear stress test in 2022 was overall low risk with no ischemia.  Reassuring.  Coronary artery disease - Stable.  No changes made.  Have discussed in the past the importance of goal-directed medical therapy.  Currently not interested in other higher intensity statins.  Has tried Repatha without success.  Obstructive sleep apnea - Continue to promote compliance.  Can contribute to fatigue.  Fatigue - Continue with physical therapy exercises.  Conditioning.  Left knee/thigh surgery.  Hypertension - Under adequate control with hydralazine Cozaar Lopressor hydrochlorothiazide  Hyperlipidemia - Refuses high intensity statins and PCSK9 therapy due to profound muscle aches weakness and pain.          Medication Adjustments/Labs and Tests Ordered: Current medicines are reviewed at length with the patient today.  Concerns regarding medicines are outlined above.  Orders Placed This Encounter  Procedures   EKG 12-Lead   No orders of the defined types were placed in this encounter.   Patient Instructions  Medication Instructions:  No changes *If you need a refill on your cardiac medications before your next appointment, please call your pharmacy*   Lab Work: none If you have labs (blood work) drawn today and your tests are completely normal, you will receive your results only by: MyChart Message (if you have MyChart) OR A paper copy in the mail If you have any lab test that is abnormal or we need to change your treatment, we will call you to review the results.   Testing/Procedures: none   Follow-Up: At [facility], you and your health needs are our priority.  As part of our continuing mission to provide you with exceptional heart care, we have created designated Provider Care  Teams.  These Care Teams include your primary Cardiologist (physician) and Advanced Practice Providers (APPs -  Physician Assistants and Nurse Practitioners) who all work together to provide you with the care you need, when you need it.   Your next appointment:   12 month(s)  Provider:   Donato Schultz, MD      Signed, Donato Schultz, MD  08/26/2022 5:03 PM    [facility]

## 2022-09-02 DIAGNOSIS — S76122D Laceration of left quadriceps muscle, fascia and tendon, subsequent encounter: Secondary | ICD-10-CM | POA: Diagnosis not present

## 2022-09-27 ENCOUNTER — Other Ambulatory Visit: Payer: Self-pay

## 2022-09-27 MED ORDER — LOSARTAN POTASSIUM 100 MG PO TABS: 100.0000 mg | ORAL_TABLET | Freq: Every day | ORAL | 3 refills | Status: DC

## 2022-10-01 DIAGNOSIS — S46012A Strain of muscle(s) and tendon(s) of the rotator cuff of left shoulder, initial encounter: Secondary | ICD-10-CM | POA: Diagnosis not present

## 2022-10-04 ENCOUNTER — Encounter: Payer: Self-pay | Admitting: Internal Medicine

## 2022-10-04 ENCOUNTER — Ambulatory Visit (INDEPENDENT_AMBULATORY_CARE_PROVIDER_SITE_OTHER): Payer: Federal, State, Local not specified - PPO | Admitting: Internal Medicine

## 2022-10-04 VITALS — BP 148/80 | HR 75 | Ht 69.0 in | Wt 240.8 lb

## 2022-10-04 DIAGNOSIS — E785 Hyperlipidemia, unspecified: Secondary | ICD-10-CM | POA: Diagnosis not present

## 2022-10-04 DIAGNOSIS — E669 Obesity, unspecified: Secondary | ICD-10-CM

## 2022-10-04 DIAGNOSIS — E1159 Type 2 diabetes mellitus with other circulatory complications: Secondary | ICD-10-CM | POA: Diagnosis not present

## 2022-10-04 DIAGNOSIS — E119 Type 2 diabetes mellitus without complications: Secondary | ICD-10-CM

## 2022-10-04 DIAGNOSIS — Z7985 Long-term (current) use of injectable non-insulin antidiabetic drugs: Secondary | ICD-10-CM | POA: Diagnosis not present

## 2022-10-04 DIAGNOSIS — Z7984 Long term (current) use of oral hypoglycemic drugs: Secondary | ICD-10-CM

## 2022-10-04 LAB — HEMOGLOBIN A1C: Hemoglobin A1C: 6.3

## 2022-10-04 NOTE — Patient Instructions (Addendum)
Please continue: - Metformin 1000 mg 2x a day - Glipizide XL 5 mg 2x a day - break/crush the evening dose - Ozempic 0.5 mg weekly  Please come back for a follow-up appointment in 4-6 months.

## 2022-10-04 NOTE — Progress Notes (Signed)
Patient ID: [name], male   DOB: May 12, 1950, 72 y.o.   MRN: [id number]  HPI: [name] is a 72 y.o.-year-old male, returning for f/u for DM2, dx 2007, non insulin-dependent (previously on insulin since 2014), uncontrolled, with complications (CAD - s/p NSTEMI 11/2014 - stent; ED; mild CKD). Last visit 4 months ago.  He is here with his granddaughter. PCP: Dr Neva Seat  Interim history: No increased urination, blurry vision, nausea, chest pain.  Reviewed HbA1c levels: Lab Results  Component Value Date   HGBA1C 6.0 (A) 06/01/2022   HGBA1C 7.0 (H) 12/10/2021   HGBA1C 7.0 (A) 08/06/2021   Pt is on a regimen of: - Metformin 1000 mg 2x a day - Glipizide XL 5 mg 2x a day -crushes the evening dose - Ozempic 0.5 mg weekly >> GERD >> restarted 08/2019 at 0.25 mg weekly >> 0.5 mg >> off >> Trulicity 1.5 mg weekly >> Ozempic 0.5 mg weekly He came off Levemir 20 units at bedtime before our visit in 09/2020 after he ran out and could not refill it He tried Toujeo 20 units at night but did not like it He did not start Januvia as suggested at last visit ("I don't think I need it"). He was Levemir 140 units in am >> decreased to 70 units >> stopped as this was too expensive for him He stopped Welchol 3.7g at bedtime a day. Stopped Farxiga 10 mg b/c frequent urination and urgency. He was on Metformin in the past >> no N/V.   Pt checks his sugars once a day: - am:  120-130 >> 90-120 >> 98-130 >> 79, 89-112, 152 >> 80-120 - 2h after b'fast: n/c >> 128, 134 >> n/c - before lunch: n/c >> 140s >> n/c >> 100-120 >> n/c - 2h after lunch: n/c >> 140-157 >> n/c >> 130s >> n/c - before dinner:  106-120 >> n/c >> 80-130 >> n/c - 2h after dinner: 150-180, 200 >> n/c >> up to 160s >> n/c >> 120-160 - bedtime:   180-200 >> 230-240 >> 137-140 >> 150 >> n/c - nighttime:94, 190-305 >> 148-216 >> n/c  Lowest sugar was 23!!! when took Ozempic every day by mistake >> ... 79 >> 80; he has hypoglycemia  awareness in the 80s Highest sugar was 200 x1 >> 170 >> 175 >> 152 >> 160.  -+ Mild CKD, last BUN/creatinine:  Lab Results  Component Value Date   BUN 15 06/01/2022   CREATININE 1.07 06/01/2022  09/07/2016: 13/0.9, glucose 133 On losartan.  -+ HL; last set of lipids: Lab Results  Component Value Date   CHOL 177 06/01/2022   HDL 42.70 06/01/2022   LDLCALC 111 (H) 06/01/2022   LDLDIRECT 131 (H) 03/22/2019   TRIG 120.0 06/01/2022   CHOLHDL 4 06/01/2022  09/07/2016: 167/65/33/121 He is on pravastatin inconsistently - every 2-3 days - now off for last week due to joint pains.  Previous muscle weakness with statins. He stopped Repatha due to muscle weakness.  - last eye exam was 07/15/2022: No DR reportedly; Dr. Tama High.   - He denies  numbness and tingling in his feet.  Pt was admitted for CP in 11/2014: NSTEMI >> CAD >> PTCA >> stent placed.  He was in ED with CP 05/2017 >> no cardiac pathology. He had shoulder surgery 01/18/2019 after rupture of his supraspinatus tendon. In 2020, he had radiotherapy for his prostate cancer (with gold beads).    ROS: + see HPI  I reviewed pt's  medications, allergies, PMH, social hx, family hx, and changes were documented in the history of present illness. Otherwise, unchanged from my initial visit note.  Past Medical History:  Diagnosis Date   Adenomatous colon polyp 2011   Anemia    Angina pectoris (HCC) 05/01/2015   med rx for 95% OM (u/a to access due to tortuosity), 95% D1 and other moderate CAD at cath   Arthritis    "back, knees" (05/01/2015)   Chest pain    pleuritic   Chronic lower back pain    Complex tear of medial meniscus of left knee 09/14/2018   Coronary artery disease    Depression    GERD (gastroesophageal reflux disease)    HTN (hypertension)    Hyperlipidemia    Obstructive sleep apnea    noncompliant with CPAP   Osteoarthritis    Pneumonia ~ 2014 X 1   Prostate cancer (HCC)    Refusal of blood transfusions as  patient is Jehovah's Witness    Rotator cuff arthropathy, right    Rupture of right supraspinatus tendon 01/18/2019   Type II or unspecified type diabetes mellitus without mention of complication, not stated as uncontrolled    Past Surgical History:  Procedure Laterality Date   CARDIAC CATHETERIZATION  04/05/2002   patent coronary arteries   CARDIAC CATHETERIZATION  05/01/2015   "tried to put stent in but couldn't"   CARDIAC CATHETERIZATION N/A 05/01/2015   Procedure: Left Heart Cath and Coronary Angiography;  Surgeon: Lyn Records, MD; LAD 60%, oD1 90%, pD1 70%, D2 70%, CFX patent stent, 30% distal to prev stent, pRCA 20%, OM1 90/95%, NL LV   CARDIAC CATHETERIZATION N/A 05/01/2015   Procedure: Coronary Stent Intervention;  Surgeon: Lyn Records, MD;  Unsuccessful PCI OM due to tortuosity   CARDIAC CATHETERIZATION N/A 11/06/2014   Procedure: Left Heart Cath and Coronary Angiography;  Surgeon: Lyn Records, MD;  Location: [facility] INVASIVE CV LAB;  Service: Cardiovascular;  Laterality: N/A;   CLOSED REDUCTION SHOULDER DISLOCATION Right ~ 1975   "& reattached muscle"   COLONOSCOPY W/ BIOPSIES AND POLYPECTOMY  X 2   ESOPHAGOGASTRODUODENOSCOPY ENDOSCOPY     KNEE ARTHROSCOPY WITH MEDIAL MENISECTOMY Left 09/14/2018   Procedure: LEFT KNEE ARTHROSCOPY CHONDROPLASY,  WITH MEDIAL MENISECTOMY;  Surgeon: Teryl Lucy, MD;  Location: [facility];  Service: Orthopedics;  Laterality: Left;   KNEE SURGERY Left 11/17/2021   PILONIDAL CYST EXCISION     SHOULDER ARTHROSCOPY WITH DEBRIDEMENT AND BICEP TENDON REPAIR Right 01/18/2019   Procedure: RIGHT SHOULDER ARTHROSCOPY WITH DEBRIDEMENT AND DECOMPRSSION SUBACROMIAL PARTIAL ACROMIOPLASTY;  Surgeon: Teryl Lucy, MD;  Location: [facility];  Service: Orthopedics;  Laterality: Right;   SHOULDER ARTHROSCOPY WITH ROTATOR CUFF REPAIR Right 01/18/2019   Procedure: SHOULDER ARTHROSCOPY WITH ROTATOR CUFF REPAIR;  Surgeon: Teryl Lucy,  MD;  Location: [facility];  Service: Orthopedics;  Laterality: Right;   Social History   Socioeconomic History   Marital status: Married    Spouse name: Cala Bradford   Number of children: 4   Years of education: Not on file   Highest education level: High school graduate  Occupational History   Occupation: Retired    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  Tobacco Use   Smoking status: Former    Packs/day: 0.50    Years: 10.00    Additional pack years: 0.00    Total pack years: 5.00    Types: Cigarettes    Quit date: 04/06/1983  Years since quitting: 39.5   Smokeless tobacco: Never  Vaping Use   Vaping Use: Never used  Substance and Sexual Activity   Alcohol use: Yes    Alcohol/week: 0.0 standard drinks of alcohol    Comment: `/26/2017 "might drink a beer q couple months mostly; summertime I might drink 2-3 beers/week"   Drug use: No   Sexual activity: Not Currently  Other Topics Concern   Not on file  Social History Narrative   Lives in [place] with spouse Melecio Wyly).  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   Social Determinants of Health   Financial Resource Strain: Not on file  Food Insecurity: Not on file  Transportation Needs: No Transportation Needs (06/08/2018)   PRAPARE - Administrator, Civil Service (Medical): No    Lack of Transportation (Non-Medical): No  Physical Activity: Not on file  Stress: Not on file  Social Connections: Not on file  Intimate Partner Violence: Not At Risk (06/08/2018)   Humiliation, Afraid, Rape, and Kick questionnaire    Fear of Current or Ex-Partner: No    Emotionally Abused: No    Physically Abused: No    Sexually Abused: No   Current Outpatient Medications on File Prior to Visit  Medication Sig Dispense Refill   albuterol (VENTOLIN HFA) 108 (90 Base) MCG/ACT inhaler INHALE 1-2 PUFFS INTO THE LUNGS EVERY 4 HOURS AS NEEDED FOR  WHEEZING OR SHORTNESS OF BREATH. 18 each 2   aspirin EC 81 MG tablet Take 81 mg by mouth daily. Swallow whole.     Blood Glucose Monitoring Suppl (RELION PREMIER BLU MONITOR) DEVI 1 Device by Does not apply route daily. 1 Device 0   fluticasone (FLONASE) 50 MCG/ACT nasal spray Place into the nose.     glipiZIDE (GLUCOTROL XL) 5 MG 24 hr tablet Take 1 tablet (5 mg total) by mouth 2 (two) times daily. 180 tablet 3   glucose blood test strip Use as instructed to check sugar 2 times daily 200 each 5   hydrALAZINE (APRESOLINE) 50 MG tablet TAKE 1 TABLET BY MOUTH THREE TIMES A DAY 270 tablet 3   hydrochlorothiazide (HYDRODIURIL) 25 MG tablet Take 1 tablet (25 mg total) by mouth daily. 90 tablet 0   losartan (COZAAR) 100 MG tablet Take 1 tablet (100 mg total) by mouth daily. 90 tablet 3   metFORMIN (GLUCOPHAGE) 500 MG tablet TAKE 2 TABLETS (1,000 MG TOTAL) BY MOUTH 2 (TWO) TIMES DAILY WITH A MEAL. 360 tablet 3   metoprolol tartrate (LOPRESSOR) 50 MG tablet Take 1 tablet (50 mg total) by mouth 2 (two) times daily. 180 tablet 3   nitroGLYCERIN (NITROSTAT) 0.4 MG SL tablet PLACE 1 TABLET UNDER THE TONGUE EVERY 5 MIN X 3 DOSES AS NEEDED FOR CHEST PAIN 75 tablet 2   pravastatin (PRAVACHOL) 40 MG tablet Take 40 mg by mouth daily.     Semaglutide,0.25 or 0.5MG /DOS, 2 MG/3ML SOPN Inject 0.5 mg under skin weekly 9 mL 3   tamsulosin (FLOMAX) 0.4 MG CAPS capsule Take 0.4 mg by mouth daily.     tiZANidine (ZANAFLEX) 4 MG tablet Take 1 tablet (4 mg total) by mouth 2 (two) times daily as needed for muscle spasms. 20 tablet 0   No current facility-administered medications on file prior to visit.   Allergies  Allergen Reactions   Statins Other (See Comments)    REACTION: joint pain Lipitor- headaches Has also tried Livalo,  pravachol, zetia, crestor, welchol   Imdur [Isosorbide Dinitrate]     Headache - "violent"   Repatha [Evolocumab]     Muscle weakness and soreness   Family History  Problem Relation Age of  Onset   Coronary artery disease Other        CABG   Coronary artery disease Mother    Stroke Mother    Heart disease Mother    Diabetes Mother    Other Father    Diabetes Sister    Diabetes Maternal Uncle        x2   Alcohol abuse Other    Diabetes Paternal Uncle        x2   Colon cancer Paternal Uncle    Colon polyps Neg Hx    Esophageal cancer Neg Hx    PE: BP (!) 148/80   Pulse 75   Ht 5\' 9"  (1.753 m)   Wt 240 lb 12.8 oz (109.2 kg)   SpO2 99%   BMI 35.56 kg/m   Wt Readings from Last 3 Encounters:  10/04/22 240 lb 12.8 oz (109.2 kg)  08/26/22 239 lb 3.2 oz (108.5 kg)  07/29/22 249 lb 6.4 oz (113.1 kg)   Constitutional: overweight, in NAD Eyes: EOMI, no exophthalmos ENT: no thyromegaly, no cervical lymphadenopathy Cardiovascular: RRR, No MRG Respiratory: CTA B Musculoskeletal: no deformities Skin: + stasis dermatitis Neurological: no tremor with outstretched hands  ASSESSMENT: 1. DM2, prev. insulin-dependent, uncontrolled, with complications - CAD, s/p NSTEMI, s/p stent 11/2014 - ED - mild CKD  2. Obesity class II  3. HL  PLAN:  1. Patient with uncontrolled type II diabetes, on oral antidiabetic regimen with metformin and sulfonylurea and also weekly GLP-1 receptor agonist.  He was previously also on long-acting insulin but able to, when he started on the GLP-1 receptor agonist.  Previously, the time and date was off on his meter so it was difficult to understand patterns, we just went by his recall.  I did advise him to try to set the time and date right. -At last visit, HbA1c was lower, 6.0%.  He was vague about whether or not he started Ozempic.  However, this was not on his medication list anymore and my understanding was that he actually ran out 1 month prior to the visit.  He restarted this since last visit but he reported more hunger with it and wanted to switch to Trulicity.  He did so, but afterwards, Trulicity was not available so he went back to  Ozempic.  He now mentions that he is tolerating it well, without increased hunger.   -At last visit, I was worried about hypoglycemia so I advised him that if he had low blood sugar to let me know so we could reduce his glipizide dose.  -At today's visit, sugars are at goal and he does not have any lows.  We will continue the current regimen. - I suggested to:  Patient Instructions  Please continue: - Metformin 1000 mg 2x a day - Glipizide XL 5 mg 2x a day - break/crush the evening dose - Ozempic 0.5 mg weekly  Please come back for a follow-up appointment in 4-6 months.  - we checked his HbA1c: 6.3% (slightly higher)  - advised to check sugars at different times of the day - 1x a day, rotating check times - advised for yearly eye exams >> he is UTD - return to clinic in 4-6 months  2. Obesity class 2 -Will continue Trulicity which should  also help with weight loss -he lost 10 pounds before the last 2 visits combined -He lost 7 pounds since last visit  3. HL -Reviewed latest lipid panel from 05/2022: LDL above target, otherwise fractions at goal: Lab Results  Component Value Date   CHOL 177 06/01/2022   HDL 42.70 06/01/2022   LDLCALC 111 (H) 06/01/2022   LDLDIRECT 131 (H) 03/22/2019   TRIG 120.0 06/01/2022   CHOLHDL 4 06/01/2022  -Unfortunately, he could not tolerate statins or Repatha due to muscle weakness -At last visit I advised him to retry pravastatin every second or even third day.  At this visit, he tells me that he started to take it daily and he developed muscle aches so he stopped.  I again advised take it 1-2 times a week and, if tolerated well, to move it closer  Carlus Pavlov, MD PhD [facility]

## 2022-10-14 ENCOUNTER — Encounter: Payer: Self-pay | Admitting: Family Medicine

## 2022-10-14 ENCOUNTER — Ambulatory Visit (INDEPENDENT_AMBULATORY_CARE_PROVIDER_SITE_OTHER): Payer: Federal, State, Local not specified - PPO | Admitting: Family Medicine

## 2022-10-14 VITALS — BP 136/88 | HR 83 | Temp 98.2°F | Ht 68.75 in | Wt 240.3 lb

## 2022-10-14 DIAGNOSIS — R600 Localized edema: Secondary | ICD-10-CM | POA: Diagnosis not present

## 2022-10-14 DIAGNOSIS — Z0000 Encounter for general adult medical examination without abnormal findings: Secondary | ICD-10-CM

## 2022-10-14 MED ORDER — FUROSEMIDE 20 MG PO TABS: 20.0000 mg | ORAL_TABLET | Freq: Every day | ORAL | 0 refills | Status: DC | PRN

## 2022-10-14 NOTE — Progress Notes (Signed)
Complete physical exam  Patient: [name]   DOB: [birth year]   72 y.o. Male  MRN: [id number]  Subjective:    Chief Complaint  Patient presents with   Annual Exam    [name] is a 72 y.o. male who presents today for a complete physical exam. He reports consuming a general diet. Home exercise routine includes goes to rehab for his knee since he had his surgery. He generally feels sometimes well but he has a lot of shoulder complaints, is getting surgery on this soon. He reports sleeping well. He does not have additional problems to discuss today.    Most recent fall risk assessment:    10/14/2022    1:14 PM  Fall Risk   Falls in the past year? 0  Number falls in past yr: 0  Injury with Fall? 0  Risk for fall due to : No Fall Risks  Follow up Falls evaluation completed     Most recent depression screenings:    10/14/2022    1:14 PM 12/10/2021   10:12 AM  PHQ 2/9 Scores  PHQ - 2 Score 2 0  PHQ- 9 Score 7     Vision:Within last year and Dental: No current dental problems and Receives regular dental care  Patient Active Problem List   Diagnosis Date Noted   History of prostate cancer 12/10/2021   Urge incontinence 12/10/2021   Traumatic rupture of left quadriceps tendon 09/30/2021   Acute non-ST elevation myocardial infarction (NSTEMI) (HCC) 05/04/2019   Statin myopathy 04/04/2019   Rupture of right supraspinatus tendon 01/18/2019   Complex tear of medial meniscus of left knee 09/14/2018   Refusal of blood transfusions as patient is Jehovah's Witness    Osteoarthritis    GERD (gastroesophageal reflux disease)    Depression    Chronic lower back pain    Anemia    Essential hypertension 01/03/2017   PAT (paroxysmal atrial tachycardia) 10/27/2015   Type 2 diabetes mellitus with circulatory disorder, without long-term current use of insulin (HCC) 06/19/2015   Angina pectoris (HCC)    CAD (coronary artery disease)    Obstructive sleep apnea 09/06/2014    Prostatitis, acute 12/13/2013   Insomnia 04/19/2013   Obesity with body mass index of 30.0-39.9 10/18/2012   Right lumbar radiculitis 09/29/2011   Adenomatous colon polyp 04/05/2009   Erectile dysfunction 11/11/2008   Hyperlipidemia LDL goal <70 08/23/2008   Benign prostatic hyperplasia with urinary obstruction 08/23/2008      Patient Care Team: Karie Georges, MD as PCP - General (Family Medicine) Jake Bathe, MD as PCP - Cardiology (Cardiology) Dahlia Byes, [facility] (Inactive) as Pharmacist (Pharmacist) Lyn Records, MD (Inactive) as Consulting Physician (Cardiology) Carlus Pavlov, MD as Consulting Physician (Endocrinology) Heloise Purpura, MD as Consulting Physician (Urology)   Outpatient Medications Prior to Visit  Medication Sig   albuterol (VENTOLIN HFA) 108 (90 Base) MCG/ACT inhaler INHALE 1-2 PUFFS INTO THE LUNGS EVERY 4 HOURS AS NEEDED FOR WHEEZING OR SHORTNESS OF BREATH.   aspirin EC 81 MG tablet Take 81 mg by mouth daily. Swallow whole.   Blood Glucose Monitoring Suppl (RELION PREMIER BLU MONITOR) DEVI 1 Device by Does not apply route daily.   fluticasone (FLONASE) 50 MCG/ACT nasal spray Place into the nose.   glipiZIDE (GLUCOTROL XL) 5 MG 24 hr tablet Take 1 tablet (5 mg total) by mouth 2 (two) times daily.   glucose blood test strip Use as instructed to check sugar 2 times  daily   hydrALAZINE (APRESOLINE) 50 MG tablet TAKE 1 TABLET BY MOUTH THREE TIMES A DAY   hydrochlorothiazide (HYDRODIURIL) 25 MG tablet Take 1 tablet (25 mg total) by mouth daily.   losartan (COZAAR) 100 MG tablet Take 1 tablet (100 mg total) by mouth daily.   metFORMIN (GLUCOPHAGE) 500 MG tablet TAKE 2 TABLETS (1,000 MG TOTAL) BY MOUTH 2 (TWO) TIMES DAILY WITH A MEAL.   metoprolol tartrate (LOPRESSOR) 50 MG tablet Take 1 tablet (50 mg total) by mouth 2 (two) times daily.   nitroGLYCERIN (NITROSTAT) 0.4 MG SL tablet PLACE 1 TABLET UNDER THE TONGUE EVERY 5 MIN X 3 DOSES AS NEEDED FOR CHEST PAIN    pravastatin (PRAVACHOL) 40 MG tablet Take 40 mg by mouth daily.   Semaglutide,0.25 or 0.5MG /DOS, 2 MG/3ML SOPN Inject 0.5 mg under skin weekly   tamsulosin (FLOMAX) 0.4 MG CAPS capsule Take 0.4 mg by mouth daily.   tiZANidine (ZANAFLEX) 4 MG tablet Take 1 tablet (4 mg total) by mouth 2 (two) times daily as needed for muscle spasms.   No facility-administered medications prior to visit.    Review of Systems  HENT:  Negative for hearing loss.   Eyes:  Negative for blurred vision.  Respiratory:  Negative for shortness of breath.   Cardiovascular:  Negative for chest pain.  Gastrointestinal: Negative.   Genitourinary: Negative.   Musculoskeletal:  Negative for back pain.  Neurological:  Negative for headaches.  Psychiatric/Behavioral:  Negative for depression.   All other systems reviewed and are negative.      Objective:     BP 136/88 (BP Location: Right Arm, Patient Position: Sitting, Cuff Size: Large)   Pulse 83   Temp 98.2 F (36.8 C) (Oral)   Ht 5' 8.75" (1.746 m)   Wt 240 lb 4.8 oz (109 kg)   SpO2 98%   BMI 35.74 kg/m    Physical Exam Vitals reviewed.  Constitutional:      Appearance: Normal appearance. He is well-groomed and normal weight.  Eyes:     Extraocular Movements: Extraocular movements intact.     Conjunctiva/sclera: Conjunctivae normal.  Neck:     Thyroid: No thyromegaly.  Cardiovascular:     Rate and Rhythm: Normal rate and regular rhythm.     Heart sounds: S1 normal and S2 normal. No murmur heard. Pulmonary:     Effort: Pulmonary effort is normal.     Breath sounds: Normal breath sounds and air entry. No rales.  Abdominal:     General: Abdomen is flat. Bowel sounds are normal.  Musculoskeletal:     Right lower leg: No edema.     Left lower leg: No edema.  Neurological:     General: No focal deficit present.     Mental Status: He is alert and oriented to person, place, and time.     Gait: Gait is intact.  Psychiatric:        Mood and  Affect: Mood and affect normal.      No results found for any visits on 10/14/22. Last metabolic panel Lab Results  Component Value Date   GLUCOSE 70 06/01/2022   NA 142 06/01/2022   K 4.3 06/01/2022   CL 106 06/01/2022   CO2 31 06/01/2022   BUN 15 06/01/2022   CREATININE 1.07 06/01/2022   GFR 69.44 06/01/2022   CALCIUM 9.8 06/01/2022   PROT 7.4 06/01/2022   ALBUMIN 4.0 06/01/2022   LABGLOB 2.9 04/30/2020   AGRATIO 1.5 04/30/2020   BILITOT  0.3 06/01/2022   ALKPHOS 108 06/01/2022   AST 19 06/01/2022   ALT 14 06/01/2022   ANIONGAP 9 12/02/2021   Last lipids Lab Results  Component Value Date   CHOL 177 06/01/2022   HDL 42.70 06/01/2022   LDLCALC 111 (H) 06/01/2022   LDLDIRECT 131 (H) 03/22/2019   TRIG 120.0 06/01/2022   CHOLHDL 4 06/01/2022   Last hemoglobin A1c Lab Results  Component Value Date   HGBA1C 6.0 (A) 06/01/2022        Assessment & Plan:    Routine Health Maintenance and Physical Exam  Immunization History  Administered Date(s) Administered   Fluad Quad(high Dose 65+) 02/13/2019, 12/28/2019, 12/05/2020   Influenza Whole 02/04/2011   Influenza,inj,Quad PF,6+ Mos 12/18/2012, 01/24/2014, 03/31/2018   Moderna Covid-19 Vaccine Bivalent Booster 74yrs & up 01/06/2021   Moderna SARS-COV2 Booster Vaccination 02/20/2020   Moderna Sars-Covid-2 Vaccination 05/09/2019, 06/06/2019   Pneumococcal Conjugate-13 05/04/2018   Pneumococcal Polysaccharide-23 08/23/2008, 02/13/2019   Td 08/23/2008   Zoster Recombinant(Shingrix) 12/22/2021, 02/16/2022    Health Maintenance  Topic Date Due   DTaP/Tdap/Td (2 - Tdap) 08/24/2018   Medicare Annual Wellness (AWV)  12/30/2020   COVID-19 Vaccine (4 - 2023-24 season) 07/29/2023 (Originally 12/04/2021)   Hepatitis C Screening  07/29/2023 (Originally 02/21/1969)   INFLUENZA VACCINE  11/04/2022   HEMOGLOBIN A1C  11/30/2022   Diabetic kidney evaluation - eGFR measurement  06/02/2023   Diabetic kidney evaluation - Urine ACR   06/02/2023   OPHTHALMOLOGY EXAM  07/15/2023   Colonoscopy  08/01/2024   Pneumonia Vaccine 10+ Years old  Completed   Zoster Vaccines- Shingrix  Completed   HPV VACCINES  Aged Out   FOOT EXAM  Discontinued    Discussed health benefits of physical activity, and encouraged him to engage in regular exercise appropriate for his age and condition.  Routine general medical examination at a health care facility  Peripheral edema -     Furosemide; Take 1 tablet (20 mg total) by mouth daily as needed.  Dispense: 10 tablet; Refill: 0   Normal physical exam findings today. Handouts given on healthy eating and exercise. RTC 6 months for follow up on his chronic medical issues.  Return in 6 months (on 04/16/2023).     Karie Georges, MD

## 2022-10-14 NOTE — Patient Instructions (Signed)
Health Maintenance, Male Adopting a healthy lifestyle and getting preventive care are important in promoting health and wellness. Ask your health care provider about: The right schedule for you to have regular tests and exams. Things you can do on your own to prevent diseases and keep yourself healthy. What should I know about diet, weight, and exercise? Eat a healthy diet  Eat a diet that includes plenty of vegetables, fruits, low-fat dairy products, and lean protein. Do not eat a lot of foods that are high in solid fats, added sugars, or sodium. Maintain a healthy weight Body mass index (BMI) is a measurement that can be used to identify possible weight problems. It estimates body fat based on height and weight. Your health care provider can help determine your BMI and help you achieve or maintain a healthy weight. Get regular exercise Get regular exercise. This is one of the most important things you can do for your health. Most adults should: Exercise for at least 150 minutes each week. The exercise should increase your heart rate and make you sweat (moderate-intensity exercise). Do strengthening exercises at least twice a week. This is in addition to the moderate-intensity exercise. Spend less time sitting. Even light physical activity can be beneficial. Watch cholesterol and blood lipids Have your blood tested for lipids and cholesterol at 73 years of age, then have this test every 5 years. You may need to have your cholesterol levels checked more often if: Your lipid or cholesterol levels are high. You are older than 73 years of age. You are at high risk for heart disease. What should I know about cancer screening? Many types of cancers can be detected early and may often be prevented. Depending on your health history and family history, you may need to have cancer screening at various ages. This may include screening for: Colorectal cancer. Prostate cancer. Skin cancer. Lung  cancer. What should I know about heart disease, diabetes, and high blood pressure? Blood pressure and heart disease High blood pressure causes heart disease and increases the risk of stroke. This is more likely to develop in people who have high blood pressure readings or are overweight. Talk with your health care provider about your target blood pressure readings. Have your blood pressure checked: Every 3-5 years if you are 18-39 years of age. Every year if you are 40 years old or older. If you are between the ages of 65 and 75 and are a current or former smoker, ask your health care provider if you should have a one-time screening for abdominal aortic aneurysm (AAA). Diabetes Have regular diabetes screenings. This checks your fasting blood sugar level. Have the screening done: Once every three years after age 45 if you are at a normal weight and have a low risk for diabetes. More often and at a younger age if you are overweight or have a high risk for diabetes. What should I know about preventing infection? Hepatitis B If you have a higher risk for hepatitis B, you should be screened for this virus. Talk with your health care provider to find out if you are at risk for hepatitis B infection. Hepatitis C Blood testing is recommended for: Everyone born from 1945 through 1965. Anyone with known risk factors for hepatitis C. Sexually transmitted infections (STIs) You should be screened each year for STIs, including gonorrhea and chlamydia, if: You are sexually active and are younger than 73 years of age. You are older than 73 years of age and your   health care provider tells you that you are at risk for this type of infection. Your sexual activity has changed since you were last screened, and you are at increased risk for chlamydia or gonorrhea. Ask your health care provider if you are at risk. Ask your health care provider about whether you are at high risk for HIV. Your health care provider  may recommend a prescription medicine to help prevent HIV infection. If you choose to take medicine to prevent HIV, you should first get tested for HIV. You should then be tested every 3 months for as long as you are taking the medicine. Follow these instructions at home: Alcohol use Do not drink alcohol if your health care provider tells you not to drink. If you drink alcohol: Limit how much you have to 0-2 drinks a day. Know how much alcohol is in your drink. In the [place] one drink equals one 12 oz bottle of beer (355 mL), one 5 oz glass of wine (148 mL), or one 1 oz glass of hard liquor (44 mL). Lifestyle Do not use any products that contain nicotine or tobacco. These products include cigarettes, chewing tobacco, and vaping devices, such as e-cigarettes. If you need help quitting, ask your health care provider. Do not use street drugs. Do not share needles. Ask your health care provider for help if you need support or information about quitting drugs. General instructions Schedule regular health, dental, and eye exams. Stay current with your vaccines. Tell your health care provider if: You often feel depressed. You have ever been abused or do not feel safe at home. Summary Adopting a healthy lifestyle and getting preventive care are important in promoting health and wellness. Follow your health care provider's instructions about healthy diet, exercising, and getting tested or screened for diseases. Follow your health care provider's instructions on monitoring your cholesterol and blood pressure. This information is not intended to replace advice given to you by your health care provider. Make sure you discuss any questions you have with your health care provider. Document Revised: 08/11/2020 Document Reviewed: 08/11/2020 Elsevier Patient Education  2024 [employer]  

## 2022-10-19 ENCOUNTER — Telehealth: Payer: Self-pay | Admitting: Family Medicine

## 2022-10-19 DIAGNOSIS — R319 Hematuria, unspecified: Secondary | ICD-10-CM

## 2022-10-19 DIAGNOSIS — I10 Essential (primary) hypertension: Secondary | ICD-10-CM

## 2022-10-19 NOTE — Telephone Encounter (Signed)
Spoke with the patient and informed him of the message below.  Patient declined hemoglobin A1C testing as he stated he was seen by Dr Elvera Lennox for this.  Stated he was looking at some labs he had done from an urgent care or ER that showed blood or something and never followed up for this and forgot to mention this during his visit.  Patient agreed to contact the pharmacy for the Tdap and a lab appt was scheduled for the BMP on 7/17.  Message sent to PCP.

## 2022-10-19 NOTE — Telephone Encounter (Signed)
Pt called to say he has decided to get some blood work done, as per a previous discussion with MD.  Pt is asking if MD still wants him to get it done here?  Also, Pt mentioned that he would like to get a Tdap. Pt has BC/BS and Medicare (Secondary)  Please advise.

## 2022-10-19 NOTE — Telephone Encounter (Signed)
Oh-- looks like it was urine -- ok to add urinalysis with reflex for hematuria also.

## 2022-10-19 NOTE — Telephone Encounter (Signed)
Left a detailed message with the information below at the patient's cell number.  Lab order added as below.

## 2022-10-19 NOTE — Telephone Encounter (Signed)
He needs an A1C for follow up but he sees Dr. Elvera Lennox for his DM management.  and a repeat BMP due to the furosemide, but he had all of his annual blood work in February so he doesn't need much. Ok to place orders for A1C and BMP for HTN dx. Please let him know about the Tdap.

## 2022-10-20 ENCOUNTER — Other Ambulatory Visit: Payer: Federal, State, Local not specified - PPO

## 2022-10-20 DIAGNOSIS — I10 Essential (primary) hypertension: Secondary | ICD-10-CM

## 2022-10-20 DIAGNOSIS — R319 Hematuria, unspecified: Secondary | ICD-10-CM

## 2022-10-21 LAB — BASIC METABOLIC PANEL
BUN: 15 mg/dL (ref 6–23)
CO2: 28 mEq/L (ref 19–32)
Calcium: 9.6 mg/dL (ref 8.4–10.5)
Chloride: 106 mEq/L (ref 96–112)
Creatinine, Ser: 1.06 mg/dL (ref 0.40–1.50)
GFR: 70.04 mL/min (ref >60.00)
Glucose, Bld: 83 mg/dL (ref 70–99)
Potassium: 3.9 mEq/L (ref 3.5–5.1)
Sodium: 141 mEq/L (ref 135–145)

## 2022-10-25 ENCOUNTER — Telehealth: Payer: Self-pay | Admitting: Family Medicine

## 2022-10-25 NOTE — Telephone Encounter (Addendum)
Pt is calling to request a Neurology referral to the [facility] Neurologist.  Pt states he may have a touch of Dementia, and would like to be checked out.   Pt stated he'd like to see the same provider he saw last time, but could not remember provider's name.   Pt is asking for a call back, once referral has been placed.

## 2022-10-25 NOTE — Telephone Encounter (Signed)
Spoke with the patient and informed him an appt is needed for evaluation since he has not been seen for this problem.  Patient was advised if he has been seen by a neurologist previously Patient stated "he just came up with this, feels he has dementia and his mother had the same problem".  I offered an appt with another provider as PCP does not have any openings soon.  Patient declined and stated he will call back if needed.

## 2022-10-26 DIAGNOSIS — M75102 Unspecified rotator cuff tear or rupture of left shoulder, not specified as traumatic: Secondary | ICD-10-CM | POA: Diagnosis not present

## 2022-11-09 DIAGNOSIS — M75102 Unspecified rotator cuff tear or rupture of left shoulder, not specified as traumatic: Secondary | ICD-10-CM | POA: Diagnosis not present

## 2022-11-16 DIAGNOSIS — M75102 Unspecified rotator cuff tear or rupture of left shoulder, not specified as traumatic: Secondary | ICD-10-CM | POA: Diagnosis not present

## 2022-11-25 DIAGNOSIS — M75102 Unspecified rotator cuff tear or rupture of left shoulder, not specified as traumatic: Secondary | ICD-10-CM | POA: Diagnosis not present

## 2022-11-26 ENCOUNTER — Other Ambulatory Visit: Payer: Self-pay | Admitting: Internal Medicine

## 2022-11-30 ENCOUNTER — Other Ambulatory Visit (HOSPITAL_COMMUNITY): Payer: Self-pay

## 2022-11-30 ENCOUNTER — Telehealth: Payer: Self-pay | Admitting: Internal Medicine

## 2022-11-30 ENCOUNTER — Other Ambulatory Visit: Payer: Self-pay

## 2022-11-30 NOTE — Telephone Encounter (Signed)
Patient states he want to speak to Dr. Elvera Lennox about changing his medication to [facility]  due to with gain and he is advising that they have it in stock [facility].

## 2022-12-01 ENCOUNTER — Ambulatory Visit (INDEPENDENT_AMBULATORY_CARE_PROVIDER_SITE_OTHER): Payer: Federal, State, Local not specified - PPO | Admitting: Family Medicine

## 2022-12-01 ENCOUNTER — Ambulatory Visit: Payer: Federal, State, Local not specified - PPO

## 2022-12-01 ENCOUNTER — Telehealth: Payer: Self-pay | Admitting: Family Medicine

## 2022-12-01 VITALS — BP 136/78 | HR 77 | Temp 97.8°F | Ht 68.75 in | Wt 244.9 lb

## 2022-12-01 DIAGNOSIS — R7989 Other specified abnormal findings of blood chemistry: Secondary | ICD-10-CM

## 2022-12-01 DIAGNOSIS — R350 Frequency of micturition: Secondary | ICD-10-CM

## 2022-12-01 DIAGNOSIS — M791 Myalgia, unspecified site: Secondary | ICD-10-CM | POA: Diagnosis not present

## 2022-12-01 DIAGNOSIS — R5383 Other fatigue: Secondary | ICD-10-CM | POA: Diagnosis not present

## 2022-12-01 LAB — POC URINALSYSI DIPSTICK (AUTOMATED)
Bilirubin, UA: POSITIVE
Blood, UA: NEGATIVE
Glucose, UA: NEGATIVE
Ketones, UA: NEGATIVE
Leukocytes, UA: NEGATIVE
Nitrite, UA: NEGATIVE
Protein, UA: POSITIVE — AB
Spec Grav, UA: 1.025 (ref 1.010–1.025)
Urobilinogen, UA: 0.2 E.U./dL
pH, UA: 5.5 (ref 5.0–8.0)

## 2022-12-01 LAB — VITAMIN D 25 HYDROXY (VIT D DEFICIENCY, FRACTURES): VITD: 13.06 ng/mL — ABNORMAL LOW (ref 30.00–100.00)

## 2022-12-01 LAB — SEDIMENTATION RATE: Sed Rate: 21 mm/hr — ABNORMAL HIGH (ref 0–20)

## 2022-12-01 LAB — TSH: TSH: 1.61 u[IU]/mL (ref 0.35–5.50)

## 2022-12-01 NOTE — Telephone Encounter (Signed)
Patient called and wanted to let Dr Caryl Never know that he was very happy with his appointment, you were very professional and know what you are doing, and felt confidant again.

## 2022-12-01 NOTE — Progress Notes (Signed)
Established Patient Office Visit  Subjective   Patient ID: [name], male    DOB: 1951-03-08  Age: 72 y.o. MRN: [id number]  Chief Complaint  Patient presents with   Labs Only   Urinary Frequency    HPI   [name] is seen today as a work in with multiple symptoms.  His past medical history is reviewed and includes history of CAD, hypertension, type 2 diabetes, obstructive sleep apnea, GERD, osteoarthritis, history of depression, history of prostate cancer.  He is complaining of some urine frequency and possible change in odor of urine but no burning with urination.  No gross hematuria.  Past history of UTI.  No fevers or chills.  Also complains of stiffness he states from his head to his feet.  Does have some myalgias and achiness neck and upper back but somewhat lower extremities as well.  Question of some generalized weakness.  He states he has been taking "baby steps "and feels stiff with ambulation.  Denies any headache.  No acute visual changes.  Has pravastatin on med list but has not been taking this at all.  Also had reported muscle weakness and soreness on Repatha.  He has questions regarding several things such as vitamin D level.  He also has concerns he had a  "liver problem".  We reviewed the fact that he did have comprehensive metabolic panel back in February which is normal  Past Medical History:  Diagnosis Date   Adenomatous colon polyp 2011   Anemia    Angina pectoris (HCC) 05/01/2015   med rx for 95% OM (u/a to access due to tortuosity), 95% D1 and other moderate CAD at cath   Arthritis    "back, knees" (05/01/2015)   Chest pain    pleuritic   Chronic lower back pain    Complex tear of medial meniscus of left knee 09/14/2018   Coronary artery disease    Depression    GERD (gastroesophageal reflux disease)    HTN (hypertension)    Hyperlipidemia    Obstructive sleep apnea    noncompliant with CPAP   Osteoarthritis    Pneumonia ~ 2014 X 1   Prostate  cancer (HCC)    Refusal of blood transfusions as patient is Jehov[employer]'s Witness    Rotator cuff arthropathy, right    Rupture of right supraspinatus tendon 01/18/2019   Type II or unspecified type diabetes mellitus without mention of complication, not stated as uncontrolled    Past Surgical History:  Procedure Laterality Date   CARDIAC CATHETERIZATION  04/05/2002   patent coronary arteries   CARDIAC CATHETERIZATION  05/01/2015   "tried to put stent in but couldn't"   CARDIAC CATHETERIZATION N/A 05/01/2015   Procedure: Left Heart Cath and Coronary Angiography;  Surgeon: Lyn Records, MD; LAD 60%, oD1 90%, pD1 70%, D2 70%, CFX patent stent, 30% distal to prev stent, pRCA 20%, OM1 90/95%, NL LV   CARDIAC CATHETERIZATION N/A 05/01/2015   Procedure: Coronary Stent Intervention;  Surgeon: Lyn Records, MD;  Unsuccessful PCI OM due to tortuosity   CARDIAC CATHETERIZATION N/A 11/06/2014   Procedure: Left Heart Cath and Coronary Angiography;  Surgeon: Lyn Records, MD;  Location: [facility] INVASIVE CV LAB;  Service: Cardiovascular;  Laterality: N/A;   CLOSED REDUCTION SHOULDER DISLOCATION Right ~ 1975   "& reattached muscle"   COLONOSCOPY W/ BIOPSIES AND POLYPECTOMY  X 2   ESOPHAGOGASTRODUODENOSCOPY ENDOSCOPY     KNEE ARTHROSCOPY WITH MEDIAL MENISECTOMY Left 09/14/2018  Procedure: LEFT KNEE ARTHROSCOPY CHONDROPLASY,  WITH MEDIAL MENISECTOMY;  Surgeon: Teryl Lucy, MD;  Location: [facility];  Service: Orthopedics;  Laterality: Left;   KNEE SURGERY Left 11/17/2021   PILONIDAL CYST EXCISION     SHOULDER ARTHROSCOPY WITH DEBRIDEMENT AND BICEP TENDON REPAIR Right 01/18/2019   Procedure: RIGHT SHOULDER ARTHROSCOPY WITH DEBRIDEMENT AND DECOMPRSSION SUBACROMIAL PARTIAL ACROMIOPLASTY;  Surgeon: Teryl Lucy, MD;  Location: [facility];  Service: Orthopedics;  Laterality: Right;   SHOULDER ARTHROSCOPY WITH ROTATOR CUFF REPAIR Right 01/18/2019   Procedure: SHOULDER ARTHROSCOPY  WITH ROTATOR CUFF REPAIR;  Surgeon: Teryl Lucy, MD;  Location: [facility];  Service: Orthopedics;  Laterality: Right;    reports that he quit smoking about 39 years ago. His smoking use included cigarettes. He started smoking about 49 years ago. He has a 5 pack-year smoking history. He has never used smokeless tobacco. He reports current alcohol use. He reports that he does not use drugs. family history includes Alcohol abuse in an other family member; Colon cancer in his paternal uncle; Coronary artery disease in his mother and another family member; Diabetes in his maternal uncle, mother, paternal uncle, and sister; Heart disease in his mother; Other in his father; Stroke in his mother. Allergies  Allergen Reactions   Statins Other (See Comments)    REACTION: joint pain Lipitor- headaches Has also tried Livalo, pravachol, zetia, crestor, welchol   Imdur [Isosorbide Dinitrate]     Headache - "violent"   Repatha [Evolocumab]     Muscle weakness and soreness    Review of Systems  Constitutional:  Positive for malaise/fatigue. Negative for chills, fever and weight loss.  Eyes:  Negative for blurred vision.  Respiratory:  Negative for shortness of breath.   Cardiovascular:  Negative for chest pain.  Gastrointestinal:  Negative for abdominal pain.  Genitourinary:  Positive for frequency. Negative for dysuria, flank pain and hematuria.  Musculoskeletal:  Positive for myalgias and neck pain.  Skin:  Negative for rash.  Neurological:  Negative for dizziness and headaches.      Objective:     BP 136/78 (BP Location: Left Arm, Patient Position: Sitting, Cuff Size: Normal)   Pulse 77   Temp 97.8 F (36.6 C) (Oral)   Ht 5' 8.75" (1.746 m)   Wt 244 lb 14.4 oz (111.1 kg)   SpO2 98%   BMI 36.43 kg/m  BP Readings from Last 3 Encounters:  12/01/22 136/78  10/14/22 136/88  10/04/22 (!) 148/80   Wt Readings from Last 3 Encounters:  12/01/22 244 lb 14.4 oz (111.1 kg)   10/14/22 240 lb 4.8 oz (109 kg)  10/04/22 240 lb 12.8 oz (109.2 kg)      Physical Exam Vitals reviewed.  Constitutional:      General: He is not in acute distress.    Appearance: Normal appearance. He is not ill-appearing.  Cardiovascular:     Rate and Rhythm: Normal rate and regular rhythm.  Pulmonary:     Effort: Pulmonary effort is normal.     Breath sounds: Normal breath sounds.  Musculoskeletal:     Cervical back: Neck supple.     Right lower leg: No edema.     Left lower leg: No edema.  Lymphadenopathy:     Cervical: No cervical adenopathy.  Neurological:     General: No focal deficit present.     Mental Status: He is alert.     Cranial Nerves: No cranial nerve deficit.  Deep Tendon Reflexes: Reflexes normal.      Results for orders placed or performed in visit on 12/01/22  POCT Urinalysis Dipstick (Automated)  Result Value Ref Range   Color, UA Yellow    Clarity, UA Clear    Glucose, UA Negative Negative   Bilirubin, UA Positive    Ketones, UA Negative    Spec Grav, UA 1.025 1.010 - 1.025   Blood, UA Negative    pH, UA 5.5 5.0 - 8.0   Protein, UA Positive (A) Negative   Urobilinogen, UA 0.2 0.2 or 1.0 E.U./dL   Nitrite, UA Negative    Leukocytes, UA Negative Negative    Last CBC Lab Results  Component Value Date   WBC 5.5 12/02/2021   HGB 12.9 (L) 12/02/2021   HCT 40.2 12/02/2021   MCV 86.5 12/02/2021   MCH 27.7 12/02/2021   RDW 14.1 12/02/2021   PLT 274 12/02/2021   Last metabolic panel Lab Results  Component Value Date   GLUCOSE 83 10/20/2022   NA 141 10/20/2022   K 3.9 10/20/2022   CL 106 10/20/2022   CO2 28 10/20/2022   BUN 15 10/20/2022   CREATININE 1.06 10/20/2022   GFR 70.04 10/20/2022   CALCIUM 9.6 10/20/2022   PROT 7.4 06/01/2022   ALBUMIN 4.0 06/01/2022   LABGLOB 2.9 04/30/2020   AGRATIO 1.5 04/30/2020   BILITOT 0.3 06/01/2022   ALKPHOS 108 06/01/2022   AST 19 06/01/2022   ALT 14 06/01/2022   ANIONGAP 9 12/02/2021    Last lipids Lab Results  Component Value Date   CHOL 177 06/01/2022   HDL 42.70 06/01/2022   LDLCALC 111 (H) 06/01/2022   LDLDIRECT 131 (H) 03/22/2019   TRIG 120.0 06/01/2022   CHOLHDL 4 06/01/2022   Last hemoglobin A1c Lab Results  Component Value Date   HGBA1C 6.0 (A) 06/01/2022      The ASCVD Risk score (Arnett DK, et al., 2019) failed to calculate for the following reasons:   The patient has a prior MI or stroke diagnosis    Assessment & Plan:   Problem List Items Addressed This Visit   None Visit Diagnoses     Urine frequency    -  Primary   Relevant Orders   POCT Urinalysis Dipstick (Automated) (Completed)   Fatigue, unspecified type       Relevant Orders   Sedimentation Rate   TSH   VITAMIN D 25 Hydroxy (Vit-D Deficiency, Fractures)   Myalgia       Relevant Orders   Sedimentation Rate   TSH   VITAMIN D 25 Hydroxy (Vit-D Deficiency, Fractures)     Patient presents with urine frequency but no burning with urination.  Urine dipstick today shows some protein but negative for blood, leukocytes, and nitrites.  Reassurance given.  No evidence for infection  He has nonspecific symptoms of fatigue and myalgia.  Previous intolerance with statin and reportedly with Repatha.  Currently not taking statin. -Check labs as above with TSH, sed rate, vitamin D. -Low clinical suspicion for polymyalgia rheumatica. -He does have known osteoarthritis involving multiple joints.  We discussed importance of regular physical activity to try to maintain mobility and strength. -Consider trial of physical therapy if above unrevealing -Continue regular follow-up with primary  No follow-ups on file.    Evelena Peat, MD

## 2022-12-03 ENCOUNTER — Encounter: Payer: Self-pay | Admitting: Internal Medicine

## 2022-12-03 ENCOUNTER — Encounter: Payer: Self-pay | Admitting: Family Medicine

## 2022-12-03 MED ORDER — VITAMIN D (ERGOCALCIFEROL) 1.25 MG (50000 UNIT) PO CAPS: 50000.0000 [IU] | ORAL_CAPSULE | ORAL | 1 refills | Status: DC

## 2022-12-03 NOTE — Telephone Encounter (Signed)
Okay for [facility]?

## 2022-12-03 NOTE — Addendum Note (Signed)
Addended by: Christy Sartorius on: 12/03/2022 09:40 AM   Modules accepted: Orders

## 2022-12-03 NOTE — Telephone Encounter (Signed)
Patient informed of the message below and voiced understanding  

## 2022-12-03 NOTE — Addendum Note (Signed)
Addended by: Christy Sartorius on: 12/03/2022 09:38 AM   Modules accepted: Orders

## 2022-12-07 NOTE — Telephone Encounter (Signed)
 I LVM for a returned call

## 2022-12-14 DIAGNOSIS — M75102 Unspecified rotator cuff tear or rupture of left shoulder, not specified as traumatic: Secondary | ICD-10-CM | POA: Diagnosis not present

## 2022-12-21 NOTE — Telephone Encounter (Signed)
I sent recommendation through Mychart

## 2022-12-27 ENCOUNTER — Other Ambulatory Visit: Payer: Self-pay | Admitting: *Deleted

## 2022-12-27 MED ORDER — HYDROCHLOROTHIAZIDE 25 MG PO TABS: 25.0000 mg | ORAL_TABLET | Freq: Every day | ORAL | 2 refills | Status: AC

## 2023-02-16 ENCOUNTER — Encounter: Payer: Self-pay | Admitting: Cardiology

## 2023-02-22 ENCOUNTER — Other Ambulatory Visit (HOSPITAL_COMMUNITY): Payer: Self-pay

## 2023-03-04 DIAGNOSIS — R7309 Other abnormal glucose: Secondary | ICD-10-CM | POA: Diagnosis not present

## 2023-03-17 ENCOUNTER — Telehealth: Payer: Self-pay | Admitting: *Deleted

## 2023-03-17 ENCOUNTER — Other Ambulatory Visit: Payer: Self-pay

## 2023-03-17 ENCOUNTER — Encounter (INDEPENDENT_AMBULATORY_CARE_PROVIDER_SITE_OTHER): Payer: Federal, State, Local not specified - PPO | Admitting: Ophthalmology

## 2023-03-17 DIAGNOSIS — H2513 Age-related nuclear cataract, bilateral: Secondary | ICD-10-CM

## 2023-03-17 DIAGNOSIS — H43813 Vitreous degeneration, bilateral: Secondary | ICD-10-CM | POA: Diagnosis not present

## 2023-03-17 DIAGNOSIS — I10 Essential (primary) hypertension: Secondary | ICD-10-CM

## 2023-03-17 DIAGNOSIS — H35033 Hypertensive retinopathy, bilateral: Secondary | ICD-10-CM

## 2023-03-17 MED ORDER — METOPROLOL TARTRATE 50 MG PO TABS: 50.0000 mg | ORAL_TABLET | Freq: Two times a day (BID) | ORAL | 1 refills | Status: DC

## 2023-03-17 NOTE — Telephone Encounter (Signed)
Left message to call back to schedule tele pre op appt.  

## 2023-03-17 NOTE — Telephone Encounter (Signed)
   Name: [name]  DOB: Nov 28, 1950  MRN: [id number]  Primary Cardiologist: Donato Schultz, MD   Preoperative team, please contact this patient and set up a phone call appointment for further preoperative risk assessment. Please obtain consent and complete medication review. Thank you for your help. Last seen 08/26/2022. No upcoming appointments.   I confirm that guidance regarding antiplatelet and oral anticoagulation therapy has been completed and, if necessary, noted below.  Per office protocol, if patient is without any new symptoms or concerns at the time of their virtual visit, he/she may hold ASA for 7 days prior to procedure. Please resume ASA as soon as possible postprocedure, at the discretion of the surgeon.    I also confirmed the patient resides in the state of [place]. As per [facility] Medical Board telemedicine laws, the patient must reside in the state in which the provider is licensed.   Joni Reining, NP 03/17/2023, 12:31 PM [facility]

## 2023-03-17 NOTE — Telephone Encounter (Signed)
   Pre-operative Risk Assessment    Patient Name: [name]  DOB: June 04, 1950 MRN: [id number]  DATE OF LAST VISIT: 08/26/22 DR. SKAINS DATE OF NEXT VISIT: NONE    Request for Surgical Clearance    Procedure:   RIGHT TOTAL KNEE REPLACEMENT  Date of Surgery:  Clearance TBD                                 Surgeon: DR. Teryl Lucy Surgeon's Group or Practice Name:  [facility] Phone number:  [phone] EXT [address] [facility] Fax number:  [phone]   Type of Clearance Requested:   - Medical  - Pharmacy:  Hold Aspirin     Type of Anesthesia:  Spinal   Additional requests/questions:    [name]   03/17/2023, 12:05 PM

## 2023-03-21 ENCOUNTER — Telehealth: Payer: Self-pay | Admitting: *Deleted

## 2023-03-21 NOTE — Telephone Encounter (Signed)
I left a message that I see that he has been scheduled for tele preop appt 04/04/23. If any questions please call back.

## 2023-03-21 NOTE — Telephone Encounter (Signed)
Tarri Fuller, CMA Certified Medical Assistant   Telephone Encounter Sign at exiting of workspace   Creation Time: 03/21/2023  3:36 PM   Sign at exiting of workspace     I left a message that I see that he has been scheduled for tele preop appt 04/04/23. If any questions please call back.           Brayton El   Telephone Encounter Signed   Creation Time: 03/21/2023  2:36 PM   Signed     Patient was returning phone call to Danielle Rankin

## 2023-03-21 NOTE — Telephone Encounter (Signed)
Pt called back and has been scheduled tele preop appt 04/04/23. Meds will need to be reviewed.

## 2023-03-21 NOTE — Telephone Encounter (Signed)
Patient was returning phone call to [facility]

## 2023-03-22 ENCOUNTER — Telehealth: Payer: Self-pay | Admitting: Cardiology

## 2023-03-22 NOTE — Telephone Encounter (Signed)
Patient called and said that he would like his medical records sent to [phone] ATTN: Steward Drone

## 2023-03-23 DIAGNOSIS — I25118 Atherosclerotic heart disease of native coronary artery with other forms of angina pectoris: Secondary | ICD-10-CM | POA: Diagnosis not present

## 2023-03-23 DIAGNOSIS — E1169 Type 2 diabetes mellitus with other specified complication: Secondary | ICD-10-CM | POA: Diagnosis not present

## 2023-03-23 DIAGNOSIS — I10 Essential (primary) hypertension: Secondary | ICD-10-CM | POA: Diagnosis not present

## 2023-03-23 DIAGNOSIS — Z1330 Encounter for screening examination for mental health and behavioral disorders, unspecified: Secondary | ICD-10-CM | POA: Diagnosis not present

## 2023-03-23 DIAGNOSIS — E785 Hyperlipidemia, unspecified: Secondary | ICD-10-CM | POA: Diagnosis not present

## 2023-03-23 DIAGNOSIS — Z01818 Encounter for other preprocedural examination: Secondary | ICD-10-CM | POA: Diagnosis not present

## 2023-03-31 DIAGNOSIS — Z01818 Encounter for other preprocedural examination: Secondary | ICD-10-CM | POA: Diagnosis not present

## 2023-03-31 DIAGNOSIS — I25118 Atherosclerotic heart disease of native coronary artery with other forms of angina pectoris: Secondary | ICD-10-CM | POA: Diagnosis not present

## 2023-03-31 DIAGNOSIS — I10 Essential (primary) hypertension: Secondary | ICD-10-CM | POA: Diagnosis not present

## 2023-04-04 ENCOUNTER — Telehealth: Payer: Federal, State, Local not specified - PPO

## 2023-04-07 ENCOUNTER — Ambulatory Visit (INDEPENDENT_AMBULATORY_CARE_PROVIDER_SITE_OTHER): Payer: Federal, State, Local not specified - PPO | Admitting: Internal Medicine

## 2023-04-07 ENCOUNTER — Encounter: Payer: Self-pay | Admitting: Internal Medicine

## 2023-04-07 VITALS — BP 136/82 | HR 66 | Ht 68.75 in | Wt 241.6 lb

## 2023-04-07 DIAGNOSIS — E1159 Type 2 diabetes mellitus with other circulatory complications: Secondary | ICD-10-CM

## 2023-04-07 DIAGNOSIS — Z7984 Long term (current) use of oral hypoglycemic drugs: Secondary | ICD-10-CM

## 2023-04-07 DIAGNOSIS — E785 Hyperlipidemia, unspecified: Secondary | ICD-10-CM | POA: Diagnosis not present

## 2023-04-07 DIAGNOSIS — Z7985 Long-term (current) use of injectable non-insulin antidiabetic drugs: Secondary | ICD-10-CM

## 2023-04-07 DIAGNOSIS — E669 Obesity, unspecified: Secondary | ICD-10-CM | POA: Diagnosis not present

## 2023-04-07 LAB — POCT GLYCOSYLATED HEMOGLOBIN (HGB A1C): Hemoglobin A1C: 6.3 % — AB (ref 4.0–5.6)

## 2023-04-07 MED ORDER — SEMAGLUTIDE(0.25 OR 0.5MG/DOS) 2 MG/3ML ~~LOC~~ SOPN: PEN_INJECTOR | SUBCUTANEOUS | 3 refills | Status: DC

## 2023-04-07 MED ORDER — GLIPIZIDE ER 5 MG PO TB24: 5.0000 mg | ORAL_TABLET | Freq: Two times a day (BID) | ORAL | 3 refills | Status: DC

## 2023-04-07 NOTE — Patient Instructions (Signed)
Please continue: - Metformin 1000 mg 2x a day - Glipizide XL 5 mg 2x a day - break/crush the evening dose - Ozempic 0.5 mg weekly  Please come back for a follow-up appointment in 4-6 months.

## 2023-04-07 NOTE — Progress Notes (Signed)
 Patient ID: [name], male   DOB: Sep 18, 1950, 73 y.o.   MRN: [id number]  HPI: [name] is a 73 y.o.-year-old male, returning for f/u for DM2, dx 2007, non insulin -dependent (previously on insulin  since 2014), uncontrolled, with complications (CAD - s/p NSTEMI 11/2014 - stent; ED; mild CKD). Last visit 6 months ago.  PCP: Dr Levora  Interim history: No increased urination, blurry vision, nausea, chest pain.  He continues to have leg swelling but she feels that this is improved. He was recently found to have a very low vitamin D  since last visit, at 13 (12/01/2022), and started on ergocalciferol  weekly dose.  Reviewed HbA1c levels: Lab Results  Component Value Date   HGBA1C 6.3 10/04/2022   HGBA1C 6.0 (A) 06/01/2022   HGBA1C 7.0 (H) 12/10/2021   Pt is on a regimen of: - Metformin  1000 mg 2x a day - Glipizide  XL 5 mg 2x a day -crushes the evening dose - Ozempic  0.5 mg weekly >> GERD >> restarted 08/2019 at 0.25 mg weekly >> 0.5 mg >> off >> Trulicity  1.5 mg weekly >> Ozempic  0.5 mg weekly He came off Levemir  20 units at bedtime before our visit in 09/2020 after he ran out and could not refill it He tried Toujeo  20 units at night but did not like it He did not start Januvia  as suggested at last visit (I don't think I need it). He was Levemir  140 units in am >> decreased to 70 units >> stopped as this was too expensive for him He stopped Welchol  3.7g at bedtime a day. Stopped Farxiga  10 mg b/c frequent urination and urgency. He was on Metformin  in the past >> no N/V.   Pt checks his sugars once a day: - am: 98-130 >> 79, 89-112, 152 >> 80-120 >> 90s, 103-130 - 2h after b'fast: n/c >> 128, 134 >> n/c  - before lunch: n/c >> 140s >> n/c >> 100-120 >> n/c - 2h after lunch: n/c >> 140-157 >> n/c >> 130s >> n/c - before dinner:  106-120 >> n/c >> 80-130 >> n/c - 2h after dinner: up to 160s >> n/c >> 120-160 >> 150-180 - bedtime:  230-240 >> 137-140 >> 150 >> n/c - nighttime:94,  190-305 >> 148-216 >> n/c  Lowest sugar was 23!!! when took Ozempic  every day by mistake >> ... 79 >> 80 >> 90s; he has hypoglycemia awareness in the 80s Highest sugar was 200 x1 ...>> 160 >> 180.  -+ Mild CKD, last BUN/creatinine:  Lab Results  Component Value Date   BUN 15 10/20/2022   CREATININE 1.06 10/20/2022   Lab Results  Component Value Date   MICRALBCREAT 1.5 06/01/2022   MICRALBCREAT 0.4 09/29/2011   MICRALBCREAT 3.1 08/23/2008  On losartan .  -+ HL; last set of lipids: Lab Results  Component Value Date   CHOL 177 06/01/2022   HDL 42.70 06/01/2022   LDLCALC 111 (H) 06/01/2022   LDLDIRECT 131 (H) 03/22/2019   TRIG 120.0 06/01/2022   CHOLHDL 4 06/01/2022  09/07/2016: 167/65/33/121 He is on pravastatin  inconsistently - every 2-3 days - now off for last week due to joint pains.  Previous muscle weakness with statins. He stopped Repatha  due to muscle weakness.  - last eye exam was 07/15/2022: No DR reportedly; Dr. Daina.   - He denies  numbness and tingling in his feet.  Last foot exam 10/04/2022.  Pt was admitted for CP in 11/2014: NSTEMI >> CAD >> PTCA >> stent placed.  He was in ED with CP 05/2017 >> no cardiac pathology. He had shoulder surgery 01/18/2019 after rupture of his supraspinatus tendon. In 2020, he had radiotherapy for his prostate cancer (with gold beads).    ROS: + see HPI  I reviewed pt's medications, allergies, PMH, social hx, family hx, and changes were documented in the history of present illness. Otherwise, unchanged from my initial visit note.  Past Medical History:  Diagnosis Date   Adenomatous colon polyp 2011   Anemia    Angina pectoris (HCC) 05/01/2015   med rx for 95% OM (u/a to access due to tortuosity), 95% D1 and other moderate CAD at cath   Arthritis    back, knees (05/01/2015)   Chest pain    pleuritic   Chronic lower back pain    Complex tear of medial meniscus of left knee 09/14/2018   Coronary artery disease     Depression    GERD (gastroesophageal reflux disease)    HTN (hypertension)    Hyperlipidemia    Obstructive sleep apnea    noncompliant with CPAP   Osteoarthritis    Pneumonia ~ 2014 X 1   Prostate cancer (HCC)    Refusal of blood transfusions as patient is Jehovah's Witness    Rotator cuff arthropathy, right    Rupture of right supraspinatus tendon 01/18/2019   Type II or unspecified type diabetes mellitus without mention of complication, not stated as uncontrolled    Past Surgical History:  Procedure Laterality Date   CARDIAC CATHETERIZATION  04/05/2002   patent coronary arteries   CARDIAC CATHETERIZATION  05/01/2015   tried to put stent in but couldn't   CARDIAC CATHETERIZATION N/A 05/01/2015   Procedure: Left Heart Cath and Coronary Angiography;  Surgeon: Victory LELON Sharps, MD; LAD 60%, oD1 90%, pD1 70%, D2 70%, CFX patent stent, 30% distal to prev stent, pRCA 20%, OM1 90/95%, NL LV   CARDIAC CATHETERIZATION N/A 05/01/2015   Procedure: Coronary Stent Intervention;  Surgeon: Victory LELON Sharps, MD;  Unsuccessful PCI OM due to tortuosity   CARDIAC CATHETERIZATION N/A 11/06/2014   Procedure: Left Heart Cath and Coronary Angiography;  Surgeon: Victory LELON Sharps, MD;  Location: [facility];  Service: Cardiovascular;  Laterality: N/A;   CLOSED REDUCTION SHOULDER DISLOCATION Right ~ 1975   & reattached muscle   COLONOSCOPY W/ BIOPSIES AND POLYPECTOMY  X 2   ESOPHAGOGASTRODUODENOSCOPY ENDOSCOPY     KNEE ARTHROSCOPY WITH MEDIAL MENISECTOMY Left 09/14/2018   Procedure: LEFT KNEE ARTHROSCOPY CHONDROPLASY,  WITH MEDIAL MENISECTOMY;  Surgeon: Josefina Chew, MD;  Location: [facility];  Service: Orthopedics;  Laterality: Left;   KNEE SURGERY Left 11/17/2021   PILONIDAL CYST EXCISION     SHOULDER ARTHROSCOPY WITH DEBRIDEMENT AND BICEP TENDON REPAIR Right 01/18/2019   Procedure: RIGHT SHOULDER ARTHROSCOPY WITH DEBRIDEMENT AND DECOMPRSSION SUBACROMIAL PARTIAL ACROMIOPLASTY;   Surgeon: Josefina Chew, MD;  Location: [facility];  Service: Orthopedics;  Laterality: Right;   SHOULDER ARTHROSCOPY WITH ROTATOR CUFF REPAIR Right 01/18/2019   Procedure: SHOULDER ARTHROSCOPY WITH ROTATOR CUFF REPAIR;  Surgeon: Josefina Chew, MD;  Location: [facility];  Service: Orthopedics;  Laterality: Right;   Social History   Socioeconomic History   Marital status: Married    Spouse name: Suzen   Number of children: 4   Years of education: Not on file   Highest education level: High school graduate  Occupational History   Occupation: Retired    Comment: Former- [employer]/Traffic  Tobacco Use   Smoking status: Former    Current packs/day: 0.00    Average packs/day: 0.5 packs/day for 10.0 years (5.0 ttl pk-yrs)    Types: Cigarettes    Start date: 04/05/1973    Quit date: 04/06/1983    Years since quitting: 40.0   Smokeless tobacco: Never  Vaping Use   Vaping status: Never Used  Substance and Sexual Activity   Alcohol use: Yes    Alcohol/week: 0.0 standard drinks of alcohol    Comment: `/26/2017 might drink a beer q couple months mostly; summertime I might drink 2-3 beers/week   Drug use: No   Sexual activity: Not Currently  Other Topics Concern   Not on file  Social History Narrative   Lives in [place] with spouse Quantay Zaremba).  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   Social Drivers of Corporate Investment Banker Strain: Low Risk  (03/23/2023)   Received [facility]   Overall Financial Resource Strain (CARDIA)    Difficulty of Paying Living Expenses: Not hard at all  Food Insecurity: No Food Insecurity (03/23/2023)   Received [facility]   Hunger Vital Sign    Worried About Running Out of Food in the Last Year: Never true    Ran Out of Food in the Last Year: Never true  Transportation Needs: No Transportation Needs (03/23/2023)    Received from [facility] - Transportation    Lack of Transportation (Medical): No    Lack of Transportation (Non-Medical): No  Physical Activity: Not on file  Stress: Not on file  Social Connections: Not on file  Intimate Partner Violence: Not At Risk (06/08/2018)   Humiliation, Afraid, Rape, and Kick questionnaire    Fear of Current or Ex-Partner: No    Emotionally Abused: No    Physically Abused: No    Sexually Abused: No   Current Outpatient Medications on File Prior to Visit  Medication Sig Dispense Refill   albuterol  (VENTOLIN  HFA) 108 (90 Base) MCG/ACT inhaler INHALE 1-2 PUFFS INTO THE LUNGS EVERY 4 HOURS AS NEEDED FOR WHEEZING OR SHORTNESS OF BREATH. 18 each 2   aspirin  EC 81 MG tablet Take 81 mg by mouth daily. Swallow whole.     Blood Glucose Monitoring Suppl (RELION PREMIER BLU MONITOR) DEVI 1 Device by Does not apply route daily. 1 Device 0   fluticasone  (FLONASE ) 50 MCG/ACT nasal spray Place into the nose.     furosemide  (LASIX ) 20 MG tablet Take 1 tablet (20 mg total) by mouth daily as needed. 10 tablet 0   glipiZIDE  (GLUCOTROL  XL) 5 MG 24 hr tablet Take 1 tablet (5 mg total) by mouth 2 (two) times daily. 180 tablet 3   glucose blood test strip Use as instructed to check sugar 2 times daily 200 each 5   hydrALAZINE  (APRESOLINE ) 50 MG tablet TAKE 1 TABLET BY MOUTH THREE TIMES A DAY 270 tablet 3   hydrochlorothiazide  (HYDRODIURIL ) 25 MG tablet Take 1 tablet (25 mg total) by mouth daily. 90 tablet 2   losartan  (COZAAR ) 100 MG tablet Take 1 tablet (100 mg total) by mouth daily. 90 tablet 3   metFORMIN  (GLUCOPHAGE ) 500 MG tablet TAKE 2 TABLETS BY MOUTH TWICE A DAY WITH MEALS 360 tablet 3   metoprolol  tartrate (LOPRESSOR ) 50 MG tablet Take 1 tablet (50 mg total) by mouth 2 (two) times daily. 180 tablet 1   nitroGLYCERIN  (NITROSTAT )  0.4 MG SL tablet PLACE 1 TABLET UNDER THE TONGUE EVERY 5 MIN X 3 DOSES AS NEEDED FOR CHEST PAIN 75 tablet 2   pravastatin  (PRAVACHOL ) 40 MG  tablet Take 40 mg by mouth daily.     Semaglutide ,0.25 or 0.5MG /DOS, 2 MG/3ML SOPN Inject 0.5 mg under skin weekly 9 mL 3   tamsulosin  (FLOMAX ) 0.4 MG CAPS capsule Take 0.4 mg by mouth daily.     tiZANidine  (ZANAFLEX ) 4 MG tablet Take 1 tablet (4 mg total) by mouth 2 (two) times daily as needed for muscle spasms. 20 tablet 0   Vitamin D , Ergocalciferol , (DRISDOL ) 1.25 MG (50000 UNIT) CAPS capsule Take 1 capsule (50,000 Units total) by mouth every 7 (seven) days. 12 capsule 1   No current facility-administered medications on file prior to visit.   Allergies  Allergen Reactions   Statins Other (See Comments)    REACTION: joint pain Lipitor- headaches Has also tried Livalo , pravachol , zetia , crestor, welchol    Imdur  [Isosorbide  Dinitrate]     Headache - violent   Repatha  [Evolocumab ]     Muscle weakness and soreness   Family History  Problem Relation Age of Onset   Coronary artery disease Other        CABG   Coronary artery disease Mother    Stroke Mother    Heart disease Mother    Diabetes Mother    Other Father    Diabetes Sister    Diabetes Maternal Uncle        x2   Alcohol abuse Other    Diabetes Paternal Uncle        x2   Colon cancer Paternal Uncle    Colon polyps Neg Hx    Esophageal cancer Neg Hx    PE: BP 136/82   Pulse 66   Ht 5' 8.75 (1.746 m)   Wt 241 lb 9.6 oz (109.6 kg)   SpO2 96%   BMI 35.94 kg/m   Wt Readings from Last 3 Encounters:  04/07/23 241 lb 9.6 oz (109.6 kg)  12/01/22 244 lb 14.4 oz (111.1 kg)  10/14/22 240 lb 4.8 oz (109 kg)   Constitutional: overweight, in NAD Eyes: EOMI, no exophthalmos ENT: no thyromegaly, no cervical lymphadenopathy Cardiovascular: RRR, No MRG, + B LE edema - wears compression hoses Respiratory: CTA B Musculoskeletal: no deformities Skin: + stasis dermatitis Neurological: no tremor with outstretched hands  ASSESSMENT: 1. DM2, prev. insulin -dependent, uncontrolled, with complications - CAD, s/p NSTEMI, s/p  stent 11/2014 - ED - mild CKD  2. Obesity class II  3. HL  PLAN:  1. Patient with  Uncontrolled type 2 diabetes, with diabetic regimen with metformin  and sulfonylurea and also weekly GLP-1 receptor agonist.  He was previously on long-acting insulin  but he was able to stop when starting a GLP-1 receptor agonist.  At last visit, sugars were at goal and he did not have lows.  We did not change his regimen.  He contacted me after the visit about possibly changing from Ozempic  to Wegovy  for stronger effect but I explained that these contained the same active substance and a substitution was not recommended. -At today's visit, sugars appear to be slightly higher, closer to the upper limit of the target range.  This is most likely due to the holidays, since his HbA1c today is at goal, stable (see below).  However, because of this, I did not suggest to decrease the dose of glipizide  but continue with the same BID regimen for now.  At  next visit, we may consider reducing the dose or even stopping it depending on the blood sugars.  For now we will continue the same regimen. - I suggested to:  Patient Instructions  Please continue: - Metformin  1000 mg 2x a day - Glipizide  XL 5 mg 2x a day - break/crush the evening dose - Ozempic  0.5 mg weekly  Please come back for a follow-up appointment in 4-6 months.  - we checked his HbA1c: 6.3% (stable) - advised to check sugars at different times of the day - 1x a day, rotating check times - advised for yearly eye exams >> he is UTD -Will check an ACR today - return to clinic in 4-6 months  2. Obesity class 2 -Currently on Ozempic  which should also help with weight loss -Inquired about Wegovy  but I explained that this is the same substance in a different formulation for people that do not have diabetes -He lost 17 pounds before the last 3 visits combined -At today's visit, he gained 1 pound since last visit.  3. HL -Reviewed latest lipid panel from  05/2022: LDL above target otherwise fractions at goal Lab Results  Component Value Date   CHOL 177 06/01/2022   HDL 42.70 06/01/2022   LDLCALC 111 (H) 06/01/2022   LDLDIRECT 131 (H) 03/22/2019   TRIG 120.0 06/01/2022   CHOLHDL 4 06/01/2022  -He could not tolerate statins or Repatha  due to muscle weakness -At last visit I advised him to retry pravastatin  every second or even third day.  At this visit, he tells me that he started to take it daily and he developed muscle aches so he stopped.  At last visit, I again advised him to try to take it 1-2 times a week and then will be closer if well-tolerated.  At this visit he tells me that he is off statins.  He is considering going back to Repatha .  He will contact cardiology.  Lela Fendt, MD PhD [facility]

## 2023-04-08 DIAGNOSIS — Z01818 Encounter for other preprocedural examination: Secondary | ICD-10-CM | POA: Diagnosis not present

## 2023-04-08 DIAGNOSIS — E7800 Pure hypercholesterolemia, unspecified: Secondary | ICD-10-CM | POA: Diagnosis not present

## 2023-04-08 DIAGNOSIS — Z8249 Family history of ischemic heart disease and other diseases of the circulatory system: Secondary | ICD-10-CM | POA: Diagnosis not present

## 2023-04-08 DIAGNOSIS — E119 Type 2 diabetes mellitus without complications: Secondary | ICD-10-CM | POA: Diagnosis not present

## 2023-04-08 DIAGNOSIS — I25118 Atherosclerotic heart disease of native coronary artery with other forms of angina pectoris: Secondary | ICD-10-CM | POA: Diagnosis not present

## 2023-04-08 DIAGNOSIS — R0600 Dyspnea, unspecified: Secondary | ICD-10-CM | POA: Diagnosis not present

## 2023-04-08 DIAGNOSIS — I10 Essential (primary) hypertension: Secondary | ICD-10-CM | POA: Diagnosis not present

## 2023-04-08 LAB — MICROALBUMIN / CREATININE URINE RATIO
Creatinine, Urine: 132 mg/dL (ref 20–320)
Microalb Creat Ratio: 2 mg/g{creat} (ref <30)
Microalb, Ur: 0.3 mg/dL

## 2023-04-12 DIAGNOSIS — C61 Malignant neoplasm of prostate: Secondary | ICD-10-CM | POA: Diagnosis not present

## 2023-04-12 DIAGNOSIS — N281 Cyst of kidney, acquired: Secondary | ICD-10-CM | POA: Diagnosis not present

## 2023-04-13 DIAGNOSIS — M1711 Unilateral primary osteoarthritis, right knee: Secondary | ICD-10-CM | POA: Diagnosis not present

## 2023-04-19 DIAGNOSIS — N401 Enlarged prostate with lower urinary tract symptoms: Secondary | ICD-10-CM | POA: Diagnosis not present

## 2023-04-19 DIAGNOSIS — C61 Malignant neoplasm of prostate: Secondary | ICD-10-CM | POA: Diagnosis not present

## 2023-04-19 DIAGNOSIS — R350 Frequency of micturition: Secondary | ICD-10-CM | POA: Diagnosis not present

## 2023-05-03 ENCOUNTER — Encounter: Payer: Self-pay | Admitting: Internal Medicine

## 2023-06-17 DIAGNOSIS — Z01818 Encounter for other preprocedural examination: Secondary | ICD-10-CM | POA: Diagnosis not present

## 2023-06-17 DIAGNOSIS — I2510 Atherosclerotic heart disease of native coronary artery without angina pectoris: Secondary | ICD-10-CM | POA: Diagnosis not present

## 2023-06-17 DIAGNOSIS — E785 Hyperlipidemia, unspecified: Secondary | ICD-10-CM | POA: Diagnosis not present

## 2023-06-17 DIAGNOSIS — E1169 Type 2 diabetes mellitus with other specified complication: Secondary | ICD-10-CM | POA: Diagnosis not present

## 2023-06-17 DIAGNOSIS — I10 Essential (primary) hypertension: Secondary | ICD-10-CM | POA: Diagnosis not present

## 2023-06-21 ENCOUNTER — Other Ambulatory Visit (HOSPITAL_COMMUNITY): Payer: Self-pay

## 2023-06-21 ENCOUNTER — Telehealth: Payer: Self-pay

## 2023-06-21 NOTE — Telephone Encounter (Signed)
 Pharmacy Patient Advocate Encounter   Received notification from CoverMyMeds that prior authorization for OZempic is required/requested.   Insurance verification completed.   The patient is insured through [facility] .   Per test claim: PA required; PA submitted to above mentioned insurance via CoverMyMeds Key/confirmation #/[facility]OC [facility] Status is pending

## 2023-06-22 NOTE — Telephone Encounter (Signed)
 Pharmacy Patient Advocate Encounter  Received notification from [facility] that Prior Authorization for Ozempic (0.25 or 0.5 MG/DOSE) 2MG /3ML pen-injectors has been APPROVED from 06/21/2023 to 06/20/2024   PA #/Case ID/Reference #: [id number]

## 2023-07-15 ENCOUNTER — Telehealth: Payer: Self-pay | Admitting: Internal Medicine

## 2023-07-15 ENCOUNTER — Other Ambulatory Visit: Payer: Self-pay | Admitting: Internal Medicine

## 2023-07-15 DIAGNOSIS — I4719 Other supraventricular tachycardia: Secondary | ICD-10-CM

## 2023-07-15 NOTE — Telephone Encounter (Signed)
 Please advise, I do not see where you have seen this pt for Thyroid.

## 2023-07-15 NOTE — Telephone Encounter (Signed)
 Patient is calling to request lab work for thyroid functions.Patient states that he feels like something is going on with his thyroid.

## 2023-07-15 NOTE — Telephone Encounter (Signed)
 I called and he states that he wrote down his symptoms but he does not remember them so he is going to put a message in through [facility].

## 2023-07-18 ENCOUNTER — Other Ambulatory Visit: Payer: Self-pay | Admitting: Internal Medicine

## 2023-07-18 ENCOUNTER — Telehealth: Payer: Self-pay

## 2023-07-18 ENCOUNTER — Encounter: Payer: Self-pay | Admitting: Internal Medicine

## 2023-07-18 MED ORDER — TIRZEPATIDE 5 MG/0.5ML ~~LOC~~ SOAJ: 5.0000 mg | SUBCUTANEOUS | 1 refills | Status: DC

## 2023-07-18 NOTE — Telephone Encounter (Signed)
 Pharmacy Patient Advocate Encounter   Received notification from CoverMyMeds that prior authorization for Mounjaro is required/requested.   Insurance verification completed.   The patient is insured through [facility] .   Per test claim: PA required and submitted KEY/EOC/Request #: [id number] APPROVED from 06/18/23 to 07/17/24

## 2023-07-18 NOTE — Telephone Encounter (Signed)
 Copied and pasted in other message requesting labs.

## 2023-07-18 NOTE — Telephone Encounter (Signed)
 Message copied and paste from Patient MyChart:symptoms . tiredness,depression,weight gain,muscle aches,skin color of hands darken or lighten for no apparent reason,getting food down my throat difficult, hoarseness in voice

## 2023-07-20 ENCOUNTER — Other Ambulatory Visit

## 2023-07-20 DIAGNOSIS — I4719 Other supraventricular tachycardia: Secondary | ICD-10-CM | POA: Diagnosis not present

## 2023-07-21 ENCOUNTER — Encounter: Payer: Self-pay | Admitting: Internal Medicine

## 2023-07-21 LAB — TSH: TSH: 0.56 m[IU]/L (ref 0.40–4.50)

## 2023-07-21 LAB — T4, FREE: Free T4: 1 ng/dL (ref 0.8–1.8)

## 2023-07-21 LAB — T3, FREE: T3, Free: 3.1 pg/mL (ref 2.3–4.2)

## 2023-09-11 ENCOUNTER — Other Ambulatory Visit: Payer: Self-pay | Admitting: Cardiology

## 2023-10-06 ENCOUNTER — Ambulatory Visit: Payer: Federal, State, Local not specified - PPO | Admitting: Internal Medicine

## 2023-10-06 ENCOUNTER — Encounter: Payer: Self-pay | Admitting: Internal Medicine

## 2023-10-06 VITALS — BP 122/70 | HR 63 | Ht 68.75 in | Wt 240.8 lb

## 2023-10-06 DIAGNOSIS — E1159 Type 2 diabetes mellitus with other circulatory complications: Secondary | ICD-10-CM | POA: Diagnosis not present

## 2023-10-06 DIAGNOSIS — E669 Obesity, unspecified: Secondary | ICD-10-CM | POA: Diagnosis not present

## 2023-10-06 DIAGNOSIS — Z7985 Long-term (current) use of injectable non-insulin antidiabetic drugs: Secondary | ICD-10-CM

## 2023-10-06 DIAGNOSIS — E785 Hyperlipidemia, unspecified: Secondary | ICD-10-CM

## 2023-10-06 DIAGNOSIS — Z7984 Long term (current) use of oral hypoglycemic drugs: Secondary | ICD-10-CM | POA: Diagnosis not present

## 2023-10-06 LAB — POCT GLYCOSYLATED HEMOGLOBIN (HGB A1C): Hemoglobin A1C: 6 % — AB (ref 4.0–5.6)

## 2023-10-06 MED ORDER — METFORMIN HCL 500 MG PO TABS: 1000.0000 mg | ORAL_TABLET | Freq: Two times a day (BID) | ORAL | 3 refills | Status: AC

## 2023-10-06 NOTE — Patient Instructions (Addendum)
 Please continue: - Metformin  1000 mg 2x a day - Mounjaro  5 mg weekly  Stop Glipizide !  Please come back for a follow-up appointment in 5 months.

## 2023-10-06 NOTE — Progress Notes (Signed)
 Patient ID: [name], male   DOB: [birth year], 73 y.o.   MRN: [id number]  HPI: [name] is a 73 y.o.-year-old male, returning for f/u for DM2, dx 2007, non insulin -dependent (previously on insulin  since 2014), uncontrolled, with complications (CAD - s/p NSTEMI 11/2014 - stent; ED; mild CKD). Last visit 6 months ago.  PCP: Dr Levora  Interim history: No increased urination, blurry vision, nausea, chest pain.   He continues to have leg swelling despite having stopped amlodipine .  Reviewed HbA1c levels: Lab Results  Component Value Date   HGBA1C 6.3 (A) 04/07/2023   HGBA1C 6.3 10/04/2022   HGBA1C 6.0 (A) 06/01/2022   Pt is on a regimen of: - Metformin  1000 mg 2x a day >> usually just in am - Glipizide  XL 5 mg 2x a day -crushes the evening dose >> usually just in the morning - >> Mounjaro  mg 5 once weekly He came off Levemir  20 units at bedtime before our visit in 09/2020 after he ran out and could not refill it He tried Toujeo  20 units at night but did not like it He did not start Januvia  as suggested at last visit (I don't think I need it). He was Levemir  140 units in am >> decreased to 70 units >> stopped as this was too expensive for him He stopped Welchol  3.7g at bedtime a day. Stopped Farxiga  10 mg b/c frequent urination and urgency. He was on Metformin  in the past >> no N/V.   Pt checks his sugars once a day: - am: 79, 89-112, 152 >> 80-120 >> 90s, 103-130 >> 90s - 2h after b'fast: n/c >> 128, 134 >> n/c  - before lunch: n/c >> 140s >> n/c >> 100-120 >> n/c - 2h after lunch: n/c >> 140-157 >> n/c >> 130s >> n/c - before dinner:  106-120 >> n/c >> 80-130 >> n/c - 2h after dinner: <160s >> n/c >> 120-160 >> 150-180 >> n/c - bedtime:  230-240 >> 137-140 >> 150 >> n/c >> 100-110 - nighttime:94, 190-305 >> 148-216 >> n/c  Lowest sugar was 23!!! when took Ozempic  every day by mistake >> ... 79 >> 80 >> 90s >> 90s; he has hypoglycemia awareness in the 80s Highest sugar  was 200 x1 ...>> 160 >> 180 >> 110  -+ Mild CKD, last BUN/creatinine:  Lab Results  Component Value Date   BUN 15 10/20/2022   CREATININE 1.06 10/20/2022   Lab Results  Component Value Date   MICRALBCREAT 2 04/07/2023   MICRALBCREAT 3.1 08/23/2008  On losartan .  -+ HL; last set of lipids: Lab Results  Component Value Date   CHOL 177 06/01/2022   HDL 42.70 06/01/2022   LDLCALC 111 (H) 06/01/2022   LDLDIRECT 131 (H) 03/22/2019   TRIG 120.0 06/01/2022   CHOLHDL 4 06/01/2022  He was on pravastatin  inconsistently - every 2-3 days -then stopped due to joint pains.  Previous muscle weakness with statins. He stopped Repatha  due to muscle weakness.  - last eye exam was 07/15/2022: No DR reportedly; Dr. Daina >> retired.   - He denies  numbness and tingling in his feet.  Last foot exam 10/04/2022.  Pt was admitted for CP in 11/2014: NSTEMI >> CAD >> PTCA >> stent placed.  He was in ED with CP 05/2017 >> no cardiac pathology. He had shoulder surgery 01/18/2019 after rupture of his supraspinatus tendon. In 2020, he had radiotherapy for his prostate cancer (with gold beads).   He had a  very low vitamin D  of 13 (12/01/2022), and started on ergocalciferol  weekly dose.  ROS: + see HPI  I reviewed pt's medications, allergies, PMH, social hx, family hx, and changes were documented in the history of present illness. Otherwise, unchanged from my initial visit note.  Past Medical History:  Diagnosis Date   Adenomatous colon polyp 2011   Anemia    Angina pectoris (HCC) 05/01/2015   med rx for 95% OM (u/a to access due to tortuosity), 95% D1 and other moderate CAD at cath   Arthritis    back, knees (05/01/2015)   Chest pain    pleuritic   Chronic lower back pain    Complex tear of medial meniscus of left knee 09/14/2018   Coronary artery disease    Depression    GERD (gastroesophageal reflux disease)    HTN (hypertension)    Hyperlipidemia    Obstructive sleep apnea     noncompliant with CPAP   Osteoarthritis    Pneumonia ~ 2014 X 1   Prostate cancer (HCC)    Refusal of blood transfusions as patient is Jehovah's Witness    Rotator cuff arthropathy, right    Rupture of right supraspinatus tendon 01/18/2019   Type II or unspecified type diabetes mellitus without mention of complication, not stated as uncontrolled    Past Surgical History:  Procedure Laterality Date   CARDIAC CATHETERIZATION  04/05/2002   patent coronary arteries   CARDIAC CATHETERIZATION  05/01/2015   tried to put stent in but couldn't   CARDIAC CATHETERIZATION N/A 05/01/2015   Procedure: Left Heart Cath and Coronary Angiography;  Surgeon: Victory LELON Sharps, MD; LAD 60%, oD1 90%, pD1 70%, D2 70%, CFX patent stent, 30% distal to prev stent, pRCA 20%, OM1 90/95%, NL LV   CARDIAC CATHETERIZATION N/A 05/01/2015   Procedure: Coronary Stent Intervention;  Surgeon: Victory LELON Sharps, MD;  Unsuccessful PCI OM due to tortuosity   CARDIAC CATHETERIZATION N/A 11/06/2014   Procedure: Left Heart Cath and Coronary Angiography;  Surgeon: Victory LELON Sharps, MD;  Location: [facility] INVASIVE CV LAB;  Service: Cardiovascular;  Laterality: N/A;   CLOSED REDUCTION SHOULDER DISLOCATION Right ~ 1975   & reattached muscle   COLONOSCOPY W/ BIOPSIES AND POLYPECTOMY  X 2   ESOPHAGOGASTRODUODENOSCOPY ENDOSCOPY     KNEE ARTHROSCOPY WITH MEDIAL MENISECTOMY Left 09/14/2018   Procedure: LEFT KNEE ARTHROSCOPY CHONDROPLASY,  WITH MEDIAL MENISECTOMY;  Surgeon: Josefina Chew, MD;  Location: [facility];  Service: Orthopedics;  Laterality: Left;   KNEE SURGERY Left 11/17/2021   PILONIDAL CYST EXCISION     SHOULDER ARTHROSCOPY WITH DEBRIDEMENT AND BICEP TENDON REPAIR Right 01/18/2019   Procedure: RIGHT SHOULDER ARTHROSCOPY WITH DEBRIDEMENT AND DECOMPRSSION SUBACROMIAL PARTIAL ACROMIOPLASTY;  Surgeon: Josefina Chew, MD;  Location: [facility];  Service: Orthopedics;  Laterality: Right;   SHOULDER ARTHROSCOPY  WITH ROTATOR CUFF REPAIR Right 01/18/2019   Procedure: SHOULDER ARTHROSCOPY WITH ROTATOR CUFF REPAIR;  Surgeon: Josefina Chew, MD;  Location: [facility];  Service: Orthopedics;  Laterality: Right;   Social History   Socioeconomic History   Marital status: Married    Spouse name: Suzen   Number of children: 4   Years of education: Not on file   Highest education level: High school graduate  Occupational History   Occupation: Retired    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  Tobacco Use   Smoking status: Former    Current packs/day: 0.00    Average packs/day: 0.5 packs/day for 10.0  years (5.0 ttl pk-yrs)    Types: Cigarettes    Start date: 04/05/1973    Quit date: 04/06/1983    Years since quitting: 40.5   Smokeless tobacco: Never  Vaping Use   Vaping status: Never Used  Substance and Sexual Activity   Alcohol use: Yes    Alcohol/week: 0.0 standard drinks of alcohol    Comment: `/26/2017 might drink a beer q couple months mostly; summertime I might drink 2-3 beers/week   Drug use: No   Sexual activity: Not Currently  Other Topics Concern   Not on file  Social History Narrative   Lives in [place] with spouse Breaker Springer).  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   Social Drivers of Corporate investment banker Strain: Low Risk  (06/17/2023)   Received from [facility]   Overall Financial Resource Strain (CARDIA)    Difficulty of Paying Living Expenses: Not hard at all  Food Insecurity: No Food Insecurity (06/17/2023)   [facility]   Hunger Vital Sign    Within the past 12 months, you worried that your food would run out before you got the money to buy more.: Never true    Within the past 12 months, the food you bought just didn't last and you didn't have money to get more.: Never true  Transportation Needs: No Transportation Needs (06/17/2023)   Received from  [facility] - Transportation    Lack of Transportation (Medical): No    Lack of Transportation (Non-Medical): No  Physical Activity: Not on file  Stress: Not on file  Social Connections: Not on file  Intimate Partner Violence: Not At Risk (06/08/2018)   Humiliation, Afraid, Rape, and Kick questionnaire    Fear of Current or Ex-Partner: No    Emotionally Abused: No    Physically Abused: No    Sexually Abused: No   Current Outpatient Medications on File Prior to Visit  Medication Sig Dispense Refill   albuterol  (VENTOLIN  HFA) 108 (90 Base) MCG/ACT inhaler INHALE 1-2 PUFFS INTO THE LUNGS EVERY 4 HOURS AS NEEDED FOR WHEEZING OR SHORTNESS OF BREATH. 18 each 2   aspirin  EC 81 MG tablet Take 81 mg by mouth daily. Swallow whole.     Blood Glucose Monitoring Suppl (RELION PREMIER BLU MONITOR) DEVI 1 Device by Does not apply route daily. 1 Device 0   fluticasone  (FLONASE ) 50 MCG/ACT nasal spray Place into the nose.     furosemide  (LASIX ) 20 MG tablet Take 1 tablet (20 mg total) by mouth daily as needed. 10 tablet 0   glipiZIDE  (GLUCOTROL  XL) 5 MG 24 hr tablet Take 1 tablet (5 mg total) by mouth 2 (two) times daily. 180 tablet 3   hydrALAZINE  (APRESOLINE ) 50 MG tablet TAKE 1 TABLET BY MOUTH THREE TIMES A DAY 270 tablet 3   hydrochlorothiazide  (HYDRODIURIL ) 25 MG tablet Take 1 tablet (25 mg total) by mouth daily. 90 tablet 2   losartan  (COZAAR ) 100 MG tablet Take 1 tablet (100 mg total) by mouth daily. 90 tablet 3   metFORMIN  (GLUCOPHAGE ) 500 MG tablet TAKE 2 TABLETS BY MOUTH TWICE A DAY WITH MEALS 360 tablet 3   metoprolol  tartrate (LOPRESSOR ) 50 MG tablet TAKE 1 TABLET BY MOUTH TWICE A DAY 60 tablet 0   nitroGLYCERIN  (NITROSTAT ) 0.4 MG SL tablet PLACE 1 TABLET UNDER THE TONGUE EVERY 5 MIN X 3 DOSES AS NEEDED FOR CHEST PAIN  75 tablet 2   pravastatin  (PRAVACHOL ) 40 MG tablet Take 40 mg by mouth daily.     tamsulosin  (FLOMAX ) 0.4 MG CAPS capsule Take 0.4 mg by mouth daily.     tirzepatide   (MOUNJARO ) 5 MG/0.5ML Pen Inject 5 mg into the skin once a week. 6 mL 1   tiZANidine  (ZANAFLEX ) 4 MG tablet Take 1 tablet (4 mg total) by mouth 2 (two) times daily as needed for muscle spasms. 20 tablet 0   Vitamin D , Ergocalciferol , (DRISDOL ) 1.25 MG (50000 UNIT) CAPS capsule Take 1 capsule (50,000 Units total) by mouth every 7 (seven) days. 12 capsule 1   No current facility-administered medications on file prior to visit.   Allergies  Allergen Reactions   Statins Other (See Comments)    REACTION: joint pain Lipitor- headaches Has also tried Livalo , pravachol , zetia , crestor, welchol    Imdur  [Isosorbide  Dinitrate]     Headache - violent   Repatha  [Evolocumab ]     Muscle weakness and soreness   Family History  Problem Relation Age of Onset   Coronary artery disease Other        CABG   Coronary artery disease Mother    Stroke Mother    Heart disease Mother    Diabetes Mother    Other Father    Diabetes Sister    Diabetes Maternal Uncle        x2   Alcohol abuse Other    Diabetes Paternal Uncle        x2   Colon cancer Paternal Uncle    Colon polyps Neg Hx    Esophageal cancer Neg Hx    PE: BP 122/70   Pulse 63   Ht 5' 8.75 (1.746 m)   Wt 240 lb 12.8 oz (109.2 kg)   SpO2 97%   BMI 35.82 kg/m   Wt Readings from Last 3 Encounters:  10/06/23 240 lb 12.8 oz (109.2 kg)  04/07/23 241 lb 9.6 oz (109.6 kg)  12/01/22 244 lb 14.4 oz (111.1 kg)   Constitutional: overweight, in NAD Eyes: EOMI, no exophthalmos ENT: no thyromegaly, no cervical lymphadenopathy Cardiovascular: RRR, No MRG, + significant B pitting LE edema Respiratory: CTA B Musculoskeletal: no deformities Skin: + stasis dermatitis Neurological: no tremor with outstretched hands Diabetic Foot Exam - Simple   Simple Foot Form Diabetic Foot exam was performed with the following findings: Yes 10/06/2023  2:32 PM  Visual Inspection See comments: Yes Sensation Testing Intact to touch and monofilament testing  bilaterally: Yes Pulse Check Posterior Tibialis and Dorsalis pulse intact bilaterally: Yes Comments + B significant pitting edema    ASSESSMENT: 1. DM2, prev. insulin -dependent, uncontrolled, with complications - CAD, s/p NSTEMI, s/p stent 11/2014 - ED - mild CKD  2. Obesity class II  3. HL  PLAN:  1. Patient with history of uncontrolled type 2 diabetes, with improved control lately and an HbA1c of 6.3% at last visit stable.  Sugars were higher after the holidays, close to the upper limit of the target range.  I did not suggest a change in regimen at that time.  Since last visit, he wanted to switch to Mounjaro  -At today's visit, he tells me that he was able to start on Mounjaro .  Sugars have improved and they are now in the lower target range.  He is now taking glipizide  twice a day, but only ends up taking it in the morning.  In fact, at today's visit, due to the excellent blood sugars, I advised him  to stop it completely.  For now, we will continue the same dose of Mounjaro  and metformin , but at next visit, my plan is to start decreasing the metformin  dose. - I suggested to:  Patient Instructions  Please continue: - Metformin  1000 mg 2x a day - Mounjaro  5 mg weekly  Stop Glipizide !  Please come back for a follow-up appointment in 5 months.  - we checked his HbA1c: 6.0% (lower) - advised to check sugars at different times of the day - 1x a day, rotating check times - advised for yearly eye exams >> he is UTD (wi centimeter record)ll - return to clinic in 3-4 months  2. Obesity class 2 - He was on Ozempic  at last visit but afterwards was he contacted me wanting to switch to Mounjaro . A PA was needed for this. - He previously inquired about Wegovy  but I explained that this is the same substance in a different formulation for people that do not have diabetes - Weight was approximately stable at last visit but he lost 17 pounds before the previous 3 visits combined - Since last  visit, he only lost 1 pound, but I feel that much of his weight is fluid retention in his legs.  3. HL - Latest lipid panel showed an LDL above target, otherwise fractions at goal: Lab Results  Component Value Date   CHOL 177 06/01/2022   HDL 42.70 06/01/2022   LDLCALC 111 (H) 06/01/2022   LDLDIRECT 131 (H) 03/22/2019   TRIG 120.0 06/01/2022   CHOLHDL 4 06/01/2022  -He could not tolerate statins or Repatha  due to muscle weakness but was considering going back to this at last visit after retrying statins (pravastatin ) and still had muscle aches.  He is not on Repatha  yet but has an appointment with cardiology coming up and he would like to discuss with Dr. Uvaldo about it  Lela Fendt, MD PhD [facility]

## 2023-11-04 ENCOUNTER — Telehealth: Payer: Self-pay

## 2023-11-04 NOTE — Telephone Encounter (Addendum)
 Pt left VM on [facility] Triage line c/o right leg pain.  Called back, no answer, left message @ [phone] 11/04/23  Patient returned call @ [phone] 11/04/23. Pt reported swelling of bilateral lower legs and feet, R>L.  No color or temp changes. No non-healing ulcers.  Patient knows to elevate legs above the heart, wear compression stockings and walk as much as tolerated.   Pt will call back if he decides to schedule an appt.  Patient called back 11/07/23 to request an appt. Patient knows [facility] will call back with his appt information.

## 2023-11-12 IMAGING — MR MR CERVICAL SPINE W/O CM
5 of 6 series · 36 of 48 positions shown · non-contrast
Comparison: Radiography 09/03/2020.  MRI 11/15/2011.

CLINICAL DATA: Cervical radiculopathy.  No red flags.

EXAM:
MRI CERVICAL SPINE WITHOUT CONTRAST
TECHNIQUE: Multiplanar, multisequence MR imaging of the cervical spine was
performed. No intravenous contrast was administered.

[Series 6: T1 · sagittal · 3.0mm · 0.86mm/px · 5 of 13 slices shown (1 of 3)]
[im 1/13]
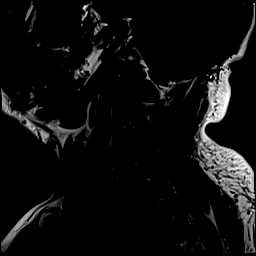
[im 4/13]
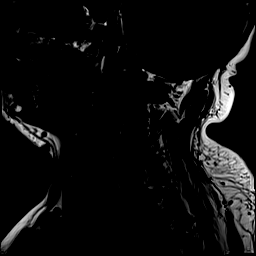
[im 7/13]
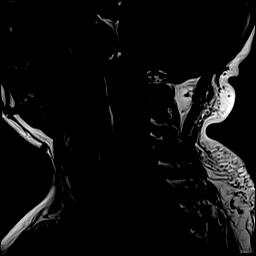
[im 10/13]
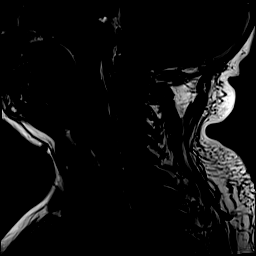
[im 13/13]
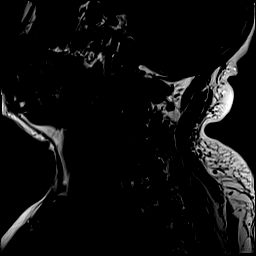

[Series 8: T2 · sagittal · 3.0mm · 0.69mm/px · 4 of 13 slices shown (1 of 2)]
[im 1/13]
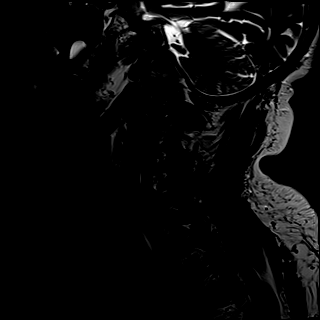
[im 5/13]
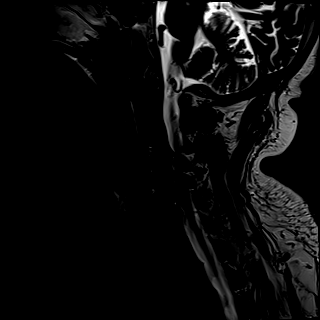
[im 9/13]
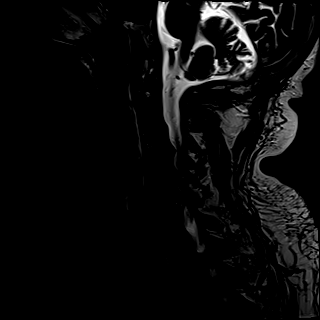
[im 13/13]
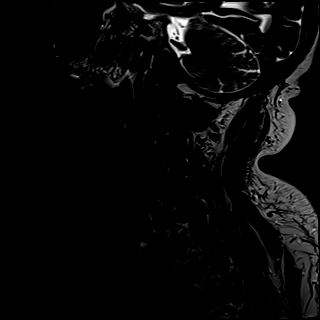

[Series 9: T2 · axial · 3.0mm · 0.70mm/px · z∈[-49,+42]mm · 8 of 31 slices shown (2 of 2)]
[im 1/31]
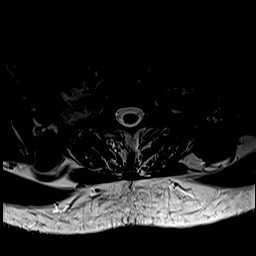
[im 4/31]
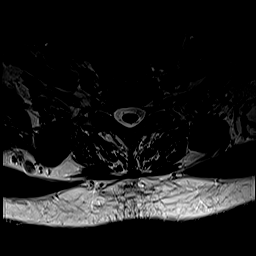
[im 11/31]
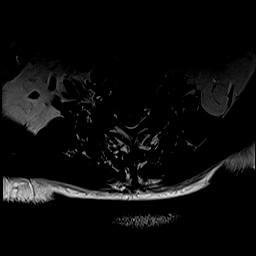
[im 14/31]
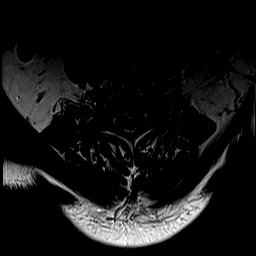
[im 17/31]
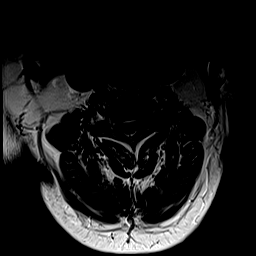
[im 21/31]
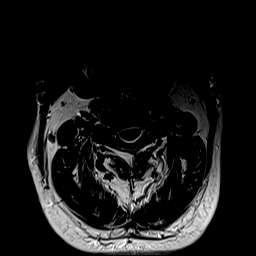
[im 27/31]
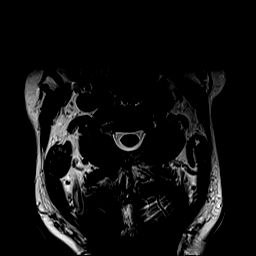
[im 31/31]
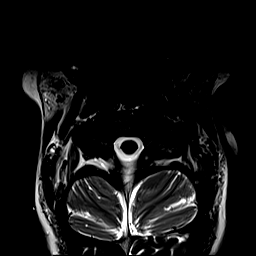

[Series 10: T1 · axial · 3.0mm · 0.35mm/px · z∈[-51,+43]mm · 11 of 32 slices shown (2 of 3)]
[im 1/32]
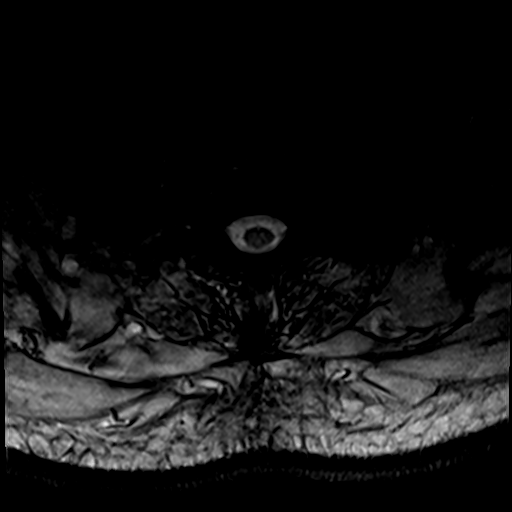
[im 4/32]
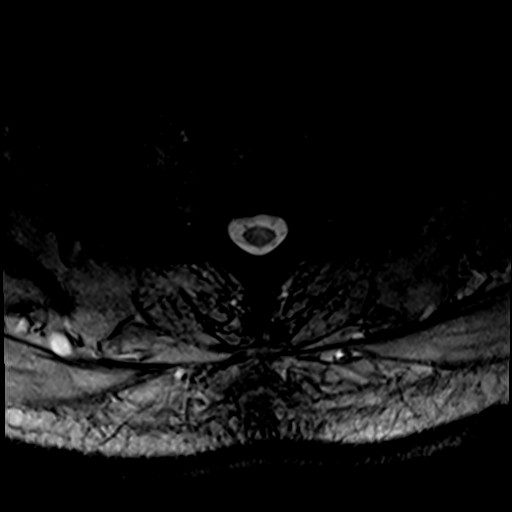
[im 7/32]
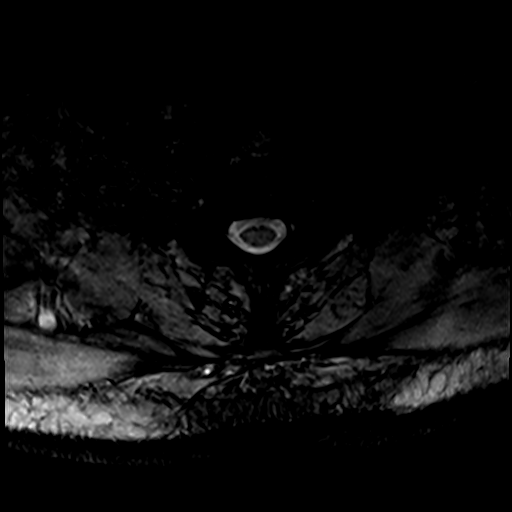
[im 10/32]
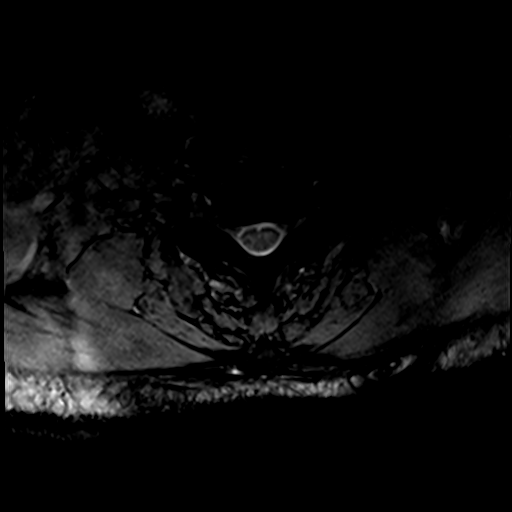
[im 13/32]
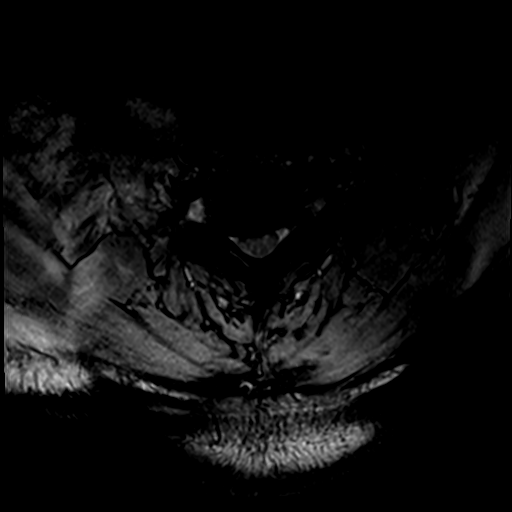
[im 16/32]
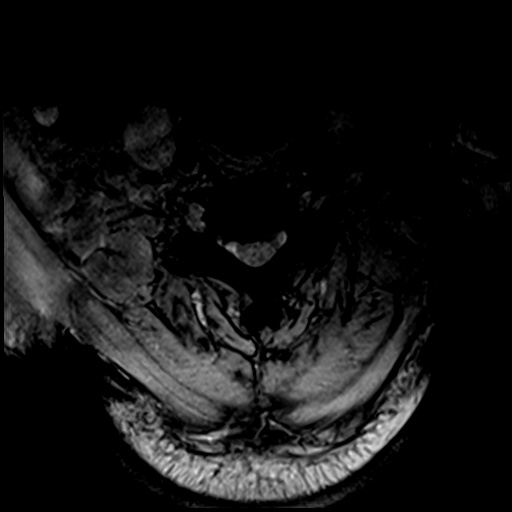
[im 19/32]
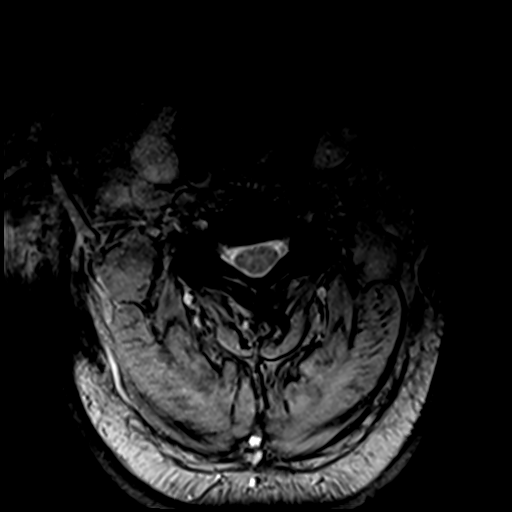
[im 22/32]
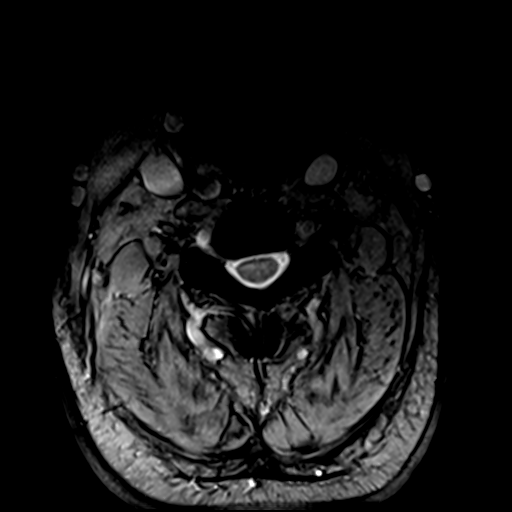
[im 25/32]
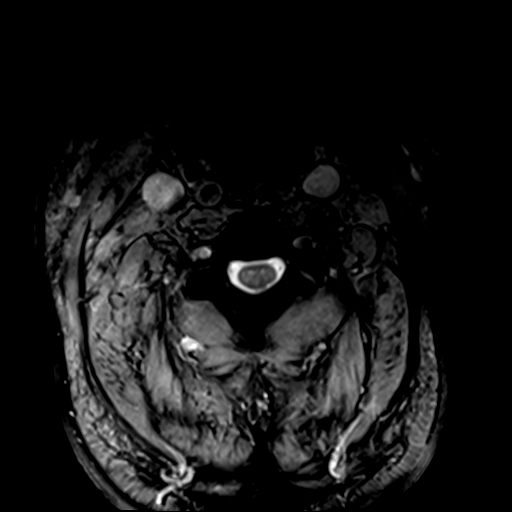
[im 28/32]
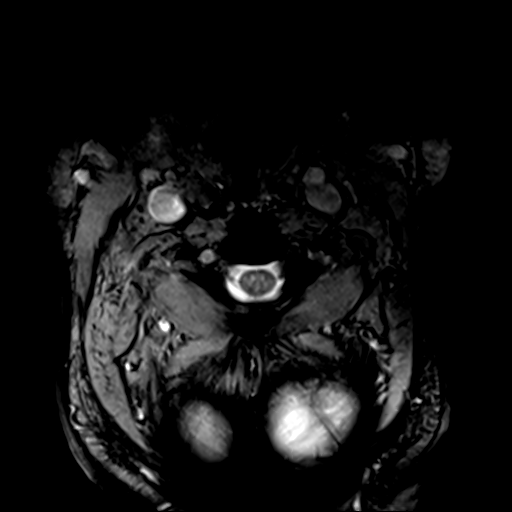
[im 32/32]
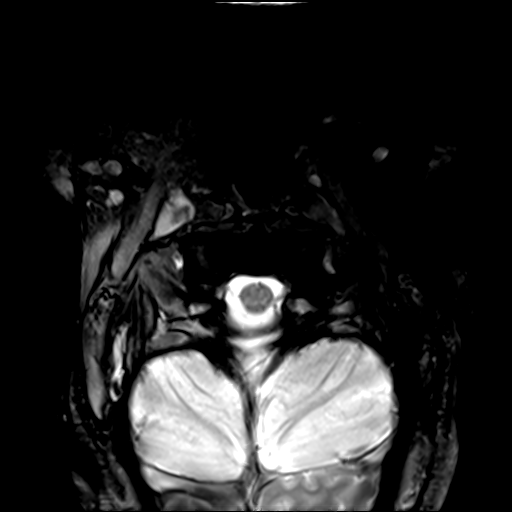

[Series 12: T1 · axial · 3.5mm · 0.70mm/px · z∈[-43,+101]mm · 8 of 39 slices shown (3 of 3)]
[im 1/39]
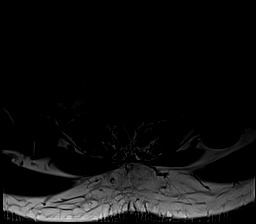
[im 7/39]
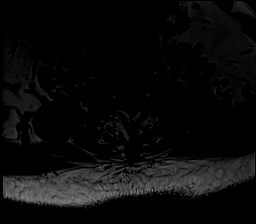
[im 13/39]
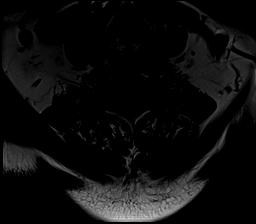
[im 16/39]
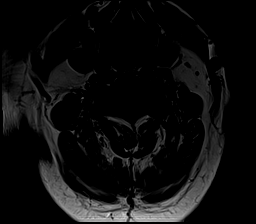
[im 23/39]
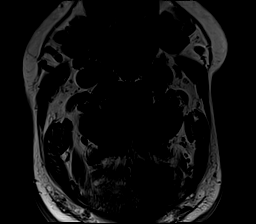
[im 26/39]
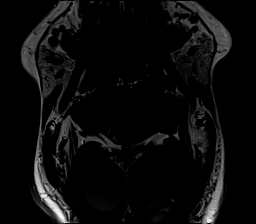
[im 32/39]
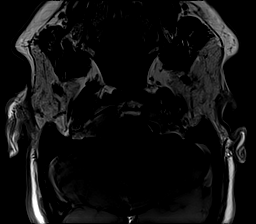
[im 39/39]
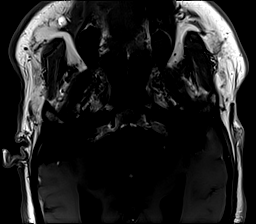

[36 of 48 positions shown; findings below may reference images not displayed]

FINDINGS: Alignment: Mild straightening of the normal cervical lordosis.

Vertebrae: No fracture or focal bone lesion.

Cord: No primary cord lesion.  See below regarding stenosis.

Posterior Fossa, vertebral arteries, paraspinal tissues: Negative

Disc levels:

Foramen magnum is widely patent.  C1-2 is unremarkable.

C2-3: Mild facet osteoarthritis.  No canal or foraminal stenosis.

C3-4: Endplate osteophytes and bulging of the disc. Narrowing of the
subarachnoid space surrounding the cord. AP diameter of the canal in
the midline 10.4 mm. No cord compression. Bilateral bony foraminal
narrowing. Similar appearance to the study of 0551.

C4-5: Endplate osteophytes and bulging of the disc. Narrowing of the
subarachnoid space surrounding the cord. AP diameter canal 1 cm.
Bilateral foraminal stenosis could affect either C5 nerve. Slight
worsening since 0551.

C5-6: Spondylosis with endplate osteophytes and bulging of the disc.
Narrowing of the subarachnoid space surrounding the cord. AP
diameter of the canal in the midline 9.2 mm. Bilateral foraminal
stenosis could affect either C6 nerve. Similar appearance to the
prior study.

C6-7: Endplate osteophytes and bulging of the disc. Narrowing of the
ventral subarachnoid space but no compression of the cord. AP
diameter of the canal in the midline 1.1 cm. Bilateral foraminal
narrowing at could possibly affect either C7 nerve. Slight worsening
since 0551.

C7-T1: Normal interspace.
IMPRESSION: Degenerative spondylosis from C3-4 through C6-7. Endplate
osteophytes and bulging of the disc narrows the ventral subarachnoid
space but do not compress the cord. Bilateral foraminal stenosis
throughout that region could affect the nerve roots on either or
both sides. Compared to the study of 0551, the findings are quite
similar, with slight worsening notable at C4-5 and C6-7.

## 2023-11-28 ENCOUNTER — Telehealth: Payer: Self-pay

## 2023-11-28 NOTE — Telephone Encounter (Signed)
 Pt called with continuing complaint of bilateral R>L swelling and burning of lower legs.  Also reporting blisters on legs. Pt referred to PCP for referral to [facility] since  patient hasn't been seen since 2022. Pt stated he would contact his cardiologist for referral to [facility].  Patient knows to elevate legs above his heart and wear compressions.

## 2023-11-30 DIAGNOSIS — M79671 Pain in right foot: Secondary | ICD-10-CM | POA: Diagnosis not present

## 2023-11-30 DIAGNOSIS — M21611 Bunion of right foot: Secondary | ICD-10-CM | POA: Diagnosis not present

## 2023-12-02 ENCOUNTER — Telehealth: Payer: Self-pay | Admitting: Family Medicine

## 2023-12-02 NOTE — Telephone Encounter (Signed)
 Lmom for pt to callback to sch for surgical clearance

## 2023-12-02 NOTE — Telephone Encounter (Signed)
 Pt has been sch for 12-09-2023

## 2023-12-09 ENCOUNTER — Ambulatory Visit (INDEPENDENT_AMBULATORY_CARE_PROVIDER_SITE_OTHER): Admitting: Family Medicine

## 2023-12-09 ENCOUNTER — Encounter: Payer: Self-pay | Admitting: Family Medicine

## 2023-12-09 VITALS — BP 138/68 | HR 75 | Temp 97.7°F | Ht 68.75 in | Wt 236.7 lb

## 2023-12-09 DIAGNOSIS — E785 Hyperlipidemia, unspecified: Secondary | ICD-10-CM

## 2023-12-09 DIAGNOSIS — Z0000 Encounter for general adult medical examination without abnormal findings: Secondary | ICD-10-CM | POA: Diagnosis not present

## 2023-12-09 DIAGNOSIS — E1159 Type 2 diabetes mellitus with other circulatory complications: Secondary | ICD-10-CM

## 2023-12-09 LAB — MICROALBUMIN / CREATININE URINE RATIO
Creatinine,U: 158.3 mg/dL
Microalb Creat Ratio: 4.8 mg/g (ref 0.0–30.0)
Microalb, Ur: 0.8 mg/dL (ref 0.0–1.9)

## 2023-12-09 LAB — COMPREHENSIVE METABOLIC PANEL WITH GFR
ALT: 29 U/L (ref 0–53)
AST: 22 U/L (ref 0–37)
Albumin: 4.1 g/dL (ref 3.5–5.2)
Alkaline Phosphatase: 93 U/L (ref 39–117)
BUN: 24 mg/dL — ABNORMAL HIGH (ref 6–23)
CO2: 29 meq/L (ref 19–32)
Calcium: 9.1 mg/dL (ref 8.4–10.5)
Chloride: 104 meq/L (ref 96–112)
Creatinine, Ser: 1.17 mg/dL (ref 0.40–1.50)
GFR: 61.72 mL/min (ref >60.00)
Glucose, Bld: 127 mg/dL — ABNORMAL HIGH (ref 70–99)
Potassium: 3.9 meq/L (ref 3.5–5.1)
Sodium: 142 meq/L (ref 135–145)
Total Bilirubin: 0.4 mg/dL (ref 0.2–1.2)
Total Protein: 7 g/dL (ref 6.0–8.3)

## 2023-12-09 NOTE — Progress Notes (Signed)
 Complete physical exam  Patient: [name]   DOB: 1951/03/06   73 y.o. Male  MRN: [id number]  Subjective:    Chief Complaint  Patient presents with   Pre-op Exam    Patient requests surgical clearance form for upcoming foot surgery    [name] is a 73 y.o. male who presents today for a complete physical exam. He reports consuming a general diet. Yardwork, mows the yard, exercise is limited due to his knee OA He generally feels well. He reports sleeping well. He does not have additional problems to discuss today.    Most recent fall risk assessment:    10/14/2022    1:14 PM  Fall Risk   Falls in the past year? 0  Number falls in past yr: 0  Injury with Fall? 0  Risk for fall due to : No Fall Risks  Follow up Falls evaluation completed     Most recent depression screenings:    10/14/2022    1:14 PM 12/10/2021   10:12 AM  PHQ 2/9 Scores  PHQ - 2 Score 2 0  PHQ- 9 Score 7     Vision:Not within last year  and reminded to schedule and Dental: No current dental problems and Receives regular dental care  Patient Active Problem List   Diagnosis Date Noted   History of prostate cancer 12/10/2021   Urge incontinence 12/10/2021   Traumatic rupture of left quadriceps tendon 09/30/2021   Acute non-ST elevation myocardial infarction (NSTEMI) (HCC) 05/04/2019   Statin myopathy 04/04/2019   Rupture of right supraspinatus tendon 01/18/2019   Complex tear of medial meniscus of left knee 09/14/2018   Refusal of blood transfusions as patient is Jehovah's Witness    Osteoarthritis    GERD (gastroesophageal reflux disease)    Depression    Chronic lower back pain    Anemia    Essential hypertension 01/03/2017   PAT (paroxysmal atrial tachycardia) (HCC) 10/27/2015   Type 2 diabetes mellitus with circulatory disorder, without long-term current use of insulin  (HCC) 06/19/2015   Angina pectoris (HCC)    CAD (coronary artery disease)    Obstructive sleep apnea 09/06/2014    Prostatitis, acute 12/13/2013   Insomnia 04/19/2013   Obesity with body mass index of 30.0-39.9 10/18/2012   Right lumbar radiculitis 09/29/2011   Adenomatous colon polyp 04/05/2009   Erectile dysfunction 11/11/2008   Hyperlipidemia LDL goal <70 08/23/2008   Benign prostatic hyperplasia with urinary obstruction 08/23/2008      Patient Care Team: Ozell Heron HERO, MD as PCP - General (Family Medicine) [name], MD as PCP - Cardiology (Cardiology) Pandora Cadet, [facility] as Pharmacist (Pharmacist) [name], MD (Inactive) as Consulting Physician (Cardiology) Trixie File, MD as Consulting Physician (Endocrinology) Renda Glance, MD as Consulting Physician (Urology)   Outpatient Medications Prior to Visit  Medication Sig   albuterol  (VENTOLIN  HFA) 108 (90 Base) MCG/ACT inhaler INHALE 1-2 PUFFS INTO THE LUNGS EVERY 4 HOURS AS NEEDED FOR WHEEZING OR SHORTNESS OF BREATH.   aspirin  EC 81 MG tablet Take 81 mg by mouth daily. Swallow whole.   Blood Glucose Monitoring Suppl (RELION PREMIER BLU MONITOR) DEVI 1 Device by Does not apply route daily.   fluticasone  (FLONASE ) 50 MCG/ACT nasal spray Place into the nose.   furosemide  (LASIX ) 20 MG tablet Take 1 tablet (20 mg total) by mouth daily as needed.   hydrALAZINE  (APRESOLINE ) 50 MG tablet TAKE 1 TABLET BY MOUTH THREE TIMES A DAY  hydrochlorothiazide  (HYDRODIURIL ) 25 MG tablet Take 1 tablet (25 mg total) by mouth daily.   losartan  (COZAAR ) 100 MG tablet Take 1 tablet (100 mg total) by mouth daily.   metFORMIN  (GLUCOPHAGE ) 500 MG tablet Take 2 tablets (1,000 mg total) by mouth 2 (two) times daily with a meal.   metoprolol  tartrate (LOPRESSOR ) 50 MG tablet TAKE 1 TABLET BY MOUTH TWICE A DAY   nitroGLYCERIN  (NITROSTAT ) 0.4 MG SL tablet PLACE 1 TABLET UNDER THE TONGUE EVERY 5 MIN X 3 DOSES AS NEEDED FOR CHEST PAIN   pravastatin  (PRAVACHOL ) 40 MG tablet Take 40 mg by mouth daily.   tamsulosin  (FLOMAX ) 0.4 MG CAPS capsule Take 0.4 mg by  mouth daily.   tirzepatide  (MOUNJARO ) 5 MG/0.5ML Pen Inject 5 mg into the skin once a week.   tiZANidine  (ZANAFLEX ) 4 MG tablet Take 1 tablet (4 mg total) by mouth 2 (two) times daily as needed for muscle spasms.   Vitamin D , Ergocalciferol , (DRISDOL ) 1.25 MG (50000 UNIT) CAPS capsule Take 1 capsule (50,000 Units total) by mouth every 7 (seven) days.   No facility-administered medications prior to visit.    Review of Systems  HENT:  Negative for hearing loss.   Eyes:  Negative for blurred vision.  Respiratory:  Negative for shortness of breath.   Cardiovascular:  Negative for chest pain.  Gastrointestinal: Negative.   Genitourinary: Negative.   Musculoskeletal:  Negative for back pain.  Neurological:  Negative for headaches.  Psychiatric/Behavioral:  Negative for depression.        Objective:     BP 138/68   Pulse 75   Temp 97.7 F (36.5 C) (Oral)   Ht 5' 8.75 (1.746 m)   Wt 236 lb 11.2 oz (107.4 kg)   SpO2 98%   BMI 35.21 kg/m    Physical Exam Vitals reviewed.  Constitutional:      Appearance: Normal appearance. He is well-groomed. He is obese.  HENT:     Right Ear: Tympanic membrane and ear canal normal.     Left Ear: Tympanic membrane and ear canal normal.     Mouth/Throat:     Mouth: Mucous membranes are moist.     Pharynx: No posterior oropharyngeal erythema.  Eyes:     Extraocular Movements: Extraocular movements intact.     Conjunctiva/sclera: Conjunctivae normal.  Neck:     Thyroid : No thyromegaly.  Cardiovascular:     Rate and Rhythm: Normal rate and regular rhythm.     Heart sounds: S1 normal and S2 normal. No murmur heard. Pulmonary:     Effort: Pulmonary effort is normal.     Breath sounds: Normal breath sounds and air entry. No rales.  Abdominal:     General: Abdomen is flat. Bowel sounds are normal.  Musculoskeletal:     Right lower leg: Edema (1+ BL pitting to the mid calf) present.     Left lower leg: Edema present.  Lymphadenopathy:      Cervical: No cervical adenopathy.  Neurological:     General: No focal deficit present.     Mental Status: He is alert and oriented to person, place, and time.     Gait: Gait is intact.  Psychiatric:        Mood and Affect: Mood and affect normal.         Assessment & Plan:    Routine Health Maintenance and Physical Exam  Immunization History  Administered Date(s) Administered   Fluad Quad(high Dose 65+) 02/13/2019, 12/28/2019, 12/05/2020  Influenza Whole 02/04/2011   Influenza,inj,Quad PF,6+ Mos 12/18/2012, 01/24/2014, 03/31/2018   Influenza-Unspecified 12/05/2020   Moderna Covid-19 Vaccine Bivalent Booster 66yrs & up 01/06/2021   Moderna SARS-COV2 Booster Vaccination 02/20/2020   Moderna Sars-Covid-2 Vaccination 05/09/2019, 06/06/2019   Pneumococcal Conjugate-13 05/04/2018   Pneumococcal Polysaccharide-23 08/23/2008, 02/13/2019   Td 08/23/2008   Tdap 10/19/2022   Zoster Recombinant(Shingrix ) 12/22/2021, 02/16/2022    Health Maintenance  Topic Date Due   Hepatitis C Screening  Never done   Medicare Annual Wellness (AWV)  12/30/2020   OPHTHALMOLOGY EXAM  07/15/2023   Influenza Vaccine  11/04/2023   COVID-19 Vaccine (4 - 2025-26 season) 12/05/2023   HEMOGLOBIN A1C  04/07/2024   Colonoscopy  08/01/2024   Diabetic kidney evaluation - eGFR measurement  12/08/2024   Diabetic kidney evaluation - Urine ACR  12/08/2024   DTaP/Tdap/Td (3 - Td or Tdap) 10/18/2032   Pneumococcal Vaccine: 50+ Years  Completed   Zoster Vaccines- Shingrix   Completed   HPV VACCINES  Aged Out   Meningococcal B Vaccine  Aged Out   FOOT EXAM  Discontinued    Discussed health benefits of physical activity, and encouraged him to engage in regular exercise appropriate for his age and condition.  Routine general medical examination at a health care facility  Type 2 diabetes mellitus with other circulatory complication, without long-term current use of insulin  (HCC) -     Microalbumin / creatinine  urine ratio; Future -     Comprehensive metabolic panel with GFR; Future  Hyperlipidemia LDL goal <70  General physical exam findings are normal today. I reviewed the patient's preventative testing, immunizations, and lifestyle habits. I made appropriate recommendations and placed orders for the appropriate tests and/or vaccinations. I counseled the patient on the CDC's recommendations for healthy exercise and diet. I counseled the patient on healthy sleep habits and stress management. Handouts to reinforce the counseling were given at the conclusion of the visit.  I also counseled patient on his medications and gave him instructions on what to take before his surgery.   Reminded patient that his eye exam is due.   Return in about 1 year (around 12/08/2024) for annual physical exam -- pt also needs medicare visit-- ok to be telephone with [facility].     Heron CHRISTELLA Sharper, MD

## 2023-12-09 NOTE — Patient Instructions (Addendum)
 HOLD aspirin  and mounjaro  for 1 week before surgery, then may resume immediately after surgery  HOLD metformin  about 2 days before surgery, then may resume immediately after surgery.  Eye exam is due.

## 2023-12-12 ENCOUNTER — Ambulatory Visit: Payer: Self-pay | Admitting: Family Medicine

## 2023-12-15 ENCOUNTER — Other Ambulatory Visit: Payer: Self-pay | Admitting: Cardiology

## 2023-12-15 ENCOUNTER — Ambulatory Visit: Payer: Self-pay | Admitting: Family Medicine

## 2023-12-15 ENCOUNTER — Other Ambulatory Visit: Payer: Self-pay | Admitting: Internal Medicine

## 2023-12-20 DIAGNOSIS — E1169 Type 2 diabetes mellitus with other specified complication: Secondary | ICD-10-CM | POA: Diagnosis not present

## 2023-12-20 DIAGNOSIS — Z6835 Body mass index (BMI) 35.0-35.9, adult: Secondary | ICD-10-CM | POA: Diagnosis not present

## 2023-12-20 DIAGNOSIS — I10 Essential (primary) hypertension: Secondary | ICD-10-CM | POA: Diagnosis not present

## 2023-12-20 DIAGNOSIS — E66812 Obesity, class 2: Secondary | ICD-10-CM | POA: Diagnosis not present

## 2023-12-20 DIAGNOSIS — Z01810 Encounter for preprocedural cardiovascular examination: Secondary | ICD-10-CM | POA: Diagnosis not present

## 2023-12-20 DIAGNOSIS — I2510 Atherosclerotic heart disease of native coronary artery without angina pectoris: Secondary | ICD-10-CM | POA: Diagnosis not present

## 2023-12-20 DIAGNOSIS — E785 Hyperlipidemia, unspecified: Secondary | ICD-10-CM | POA: Diagnosis not present

## 2023-12-23 ENCOUNTER — Telehealth: Payer: Self-pay | Admitting: Family Medicine

## 2023-12-23 NOTE — Telephone Encounter (Signed)
 Patient dropped off form for preoperative clearance. Form in folder

## 2023-12-26 NOTE — Telephone Encounter (Signed)
 PCP completed the form at no charge, this was faxed to [facility] at [phone] and sent to be scanned.  I left a detailed message at the patient's cell number with this information also.

## 2024-01-08 ENCOUNTER — Other Ambulatory Visit: Payer: Self-pay | Admitting: Cardiology

## 2024-01-17 DIAGNOSIS — G8918 Other acute postprocedural pain: Secondary | ICD-10-CM | POA: Diagnosis not present

## 2024-01-17 DIAGNOSIS — M21611 Bunion of right foot: Secondary | ICD-10-CM | POA: Diagnosis not present

## 2024-01-25 ENCOUNTER — Other Ambulatory Visit: Payer: Self-pay | Admitting: Cardiology

## 2024-01-27 LAB — OPHTHALMOLOGY REPORT-SCANNED

## 2024-02-01 ENCOUNTER — Other Ambulatory Visit: Payer: Self-pay | Admitting: Family Medicine

## 2024-02-01 ENCOUNTER — Other Ambulatory Visit: Payer: Self-pay | Admitting: Cardiology

## 2024-02-24 DIAGNOSIS — M21611 Bunion of right foot: Secondary | ICD-10-CM | POA: Diagnosis not present

## 2024-02-24 DIAGNOSIS — Z4889 Encounter for other specified surgical aftercare: Secondary | ICD-10-CM | POA: Diagnosis not present

## 2024-02-29 DIAGNOSIS — E119 Type 2 diabetes mellitus without complications: Secondary | ICD-10-CM | POA: Diagnosis not present

## 2024-02-29 LAB — OPHTHALMOLOGY REPORT-SCANNED

## 2024-03-23 DIAGNOSIS — M21611 Bunion of right foot: Secondary | ICD-10-CM | POA: Diagnosis not present

## 2024-04-10 ENCOUNTER — Ambulatory Visit (INDEPENDENT_AMBULATORY_CARE_PROVIDER_SITE_OTHER): Admitting: Internal Medicine

## 2024-04-10 ENCOUNTER — Encounter: Payer: Self-pay | Admitting: Internal Medicine

## 2024-04-10 VITALS — BP 170/64 | HR 74 | Resp 18 | Ht 69.0 in | Wt 239.0 lb

## 2024-04-10 DIAGNOSIS — E1159 Type 2 diabetes mellitus with other circulatory complications: Secondary | ICD-10-CM | POA: Diagnosis not present

## 2024-04-10 DIAGNOSIS — Z7984 Long term (current) use of oral hypoglycemic drugs: Secondary | ICD-10-CM

## 2024-04-10 DIAGNOSIS — Z7985 Long-term (current) use of injectable non-insulin antidiabetic drugs: Secondary | ICD-10-CM

## 2024-04-10 DIAGNOSIS — E785 Hyperlipidemia, unspecified: Secondary | ICD-10-CM | POA: Diagnosis not present

## 2024-04-10 DIAGNOSIS — E66811 Obesity, class 1: Secondary | ICD-10-CM

## 2024-04-10 DIAGNOSIS — Z6835 Body mass index (BMI) 35.0-35.9, adult: Secondary | ICD-10-CM | POA: Diagnosis not present

## 2024-04-10 LAB — POCT GLYCOSYLATED HEMOGLOBIN (HGB A1C): Hemoglobin A1C: 6.4 % — AB (ref 4.0–5.6)

## 2024-04-10 MED ORDER — ACCU-CHEK SOFTCLIX LANCETS MISC: 3 refills | Status: AC

## 2024-04-10 MED ORDER — ACCU-CHEK GUIDE W/DEVICE KIT: PACK | 0 refills | Status: AC

## 2024-04-10 MED ORDER — GLUCOSE BLOOD VI STRP: ORAL_STRIP | 3 refills | Status: AC

## 2024-04-10 NOTE — Patient Instructions (Addendum)
 Please continue: - Metformin  1000 mg 2x a day - Mounjaro  5 mg weekly  Restart checking blood sugars 1x a day.  Do not wear the compression socks at next visit.  Please come back for a follow-up appointment in 4 months.

## 2024-04-10 NOTE — Addendum Note (Signed)
 Addended by: CLEOTILDE ROLIN RAMAN on: 04/10/2024 04:38 PM   Modules accepted: Orders

## 2024-04-10 NOTE — Progress Notes (Signed)
 Patient ID: [name], male   DOB: Jul 18, 1950, 74 y.o.   MRN: [id number]  HPI: [name] is a 74 y.o.-year-old male, returning for f/u for DM2, dx 2007, non insulin -dependent (previously on insulin  since 2014), uncontrolled, with complications (CAD - s/p NSTEMI 11/2014 - stent; ED; mild CKD). Last visit 6 months ago.  PCP: Dr Levora  Interim history: No increased urination, blurry vision, nausea, chest pain.   He continues to have leg swelling despite having stopped amlodipine . He wears compression hoses. He declines a foot exam at today's visit. He had bunion sx 2 mo ago.  His right leg is more swollen.  Reviewed HbA1c levels: Lab Results  Component Value Date   HGBA1C 6.0 (A) 10/06/2023   HGBA1C 6.3 (A) 04/07/2023   HGBA1C 6.3 10/04/2022   Pt is on a regimen of: - Metformin  1000 mg 2x a day >> usually just 1000 mg in am -  >> stopped 10/2021 - Mounjaro  mg 5 once weekly He came off Levemir  20 units at bedtime before our visit in 09/2020 after he ran out and could not refill it He tried Toujeo  20 units at night but did not like it He did not start Januvia  as suggested at last visit (I don't think I need it). He was Levemir  140 units in am >> decreased to 70 units >> stopped as this was too expensive for him He stopped Welchol  3.7g at bedtime a day. Stopped Farxiga  10 mg b/c frequent urination and urgency. He was on Metformin  in the past >> no N/V.  He was previously on Ozempic  and Trulicity  before switching to Mounjaro .  Pt was checking his sugars once a day - not recently - defective meter. From last OV: - am: 79, 89-112, 152 >> 80-120 >> 90s, 103-130 >> 90s - 2h after b'fast: n/c >> 128, 134 >> n/c  - before lunch: n/c >> 140s >> n/c >> 100-120 >> n/c - 2h after lunch: n/c >> 140-157 >> n/c >> 130s >> n/c - before dinner:  106-120 >> n/c >> 80-130 >> n/c - 2h after dinner: <160s >> n/c >> 120-160 >> 150-180 >> n/c - bedtime:  230-240 >> 137-140 >> 150 >> n/c >>  100-110 - nighttime:94, 190-305 >> 148-216 >> n/c  Lowest sugar was 23!!! when took Ozempic  every day by mistake >> ... 90s; he has hypoglycemia awareness in the 80s Highest sugar was 200 x1 ...>> 180 >> 110  -+ Mild CKD, last BUN/creatinine:  Lab Results  Component Value Date   BUN 24 (H) 12/09/2023   CREATININE 1.17 12/09/2023   Lab Results  Component Value Date   MICRALBCREAT 4.8 12/09/2023   MICRALBCREAT 2 04/07/2023   MICRALBCREAT 3.1 08/23/2008  On losartan .  -+ HL; last set of lipids: Lab Results  Component Value Date   CHOL 177 06/01/2022   HDL 42.70 06/01/2022   LDLCALC 111 (H) 06/01/2022   LDLDIRECT 131 (H) 03/22/2019   TRIG 120.0 06/01/2022   CHOLHDL 4 06/01/2022  He was on pravastatin  inconsistently - every 2-3 days -then stopped due to joint pains.  Previous muscle weakness with statins. He stopped Repatha  due to muscle weakness.  - last eye exam was 02/29/2024: No DR; Dr. Daina >> retired.   - He denies  numbness and tingling in his feet.  Last foot exam 10/04/2022.  Pt was admitted for CP in 11/2014: NSTEMI >> CAD >> PTCA >> stent placed.  He was in ED with CP 05/2017 >>  no cardiac pathology. He had shoulder surgery 01/18/2019 after rupture of his supraspinatus tendon. In 2020, he had radiotherapy for his prostate cancer (with gold beads).   He had a very low vitamin D  of 13 (12/01/2022), and started on ergocalciferol  weekly dose.  ROS: + see HPI  I reviewed pt's medications, allergies, PMH, social hx, family hx, and changes were documented in the history of present illness. Otherwise, unchanged from my initial visit note.  Past Medical History:  Diagnosis Date   Adenomatous colon polyp 2011   Anemia    Angina pectoris 05/01/2015   med rx for 95% OM (u/a to access due to tortuosity), 95% D1 and other moderate CAD at cath   Arthritis    back, knees (05/01/2015)   Chest pain    pleuritic   Chronic lower back pain    Complex tear of medial  meniscus of left knee 09/14/2018   Coronary artery disease    Depression    GERD (gastroesophageal reflux disease)    HTN (hypertension)    Hyperlipidemia    Obstructive sleep apnea    noncompliant with CPAP   Osteoarthritis    Pneumonia ~ 2014 X 1   Prostate cancer (HCC)    Refusal of blood transfusions as patient is Jehovah's Witness    Rotator cuff arthropathy, right    Rupture of right supraspinatus tendon 01/18/2019   Type II or unspecified type diabetes mellitus without mention of complication, not stated as uncontrolled    Past Surgical History:  Procedure Laterality Date   CARDIAC CATHETERIZATION  04/05/2002   patent coronary arteries   CARDIAC CATHETERIZATION  05/01/2015   tried to put stent in but couldn't   CARDIAC CATHETERIZATION N/A 05/01/2015   Procedure: Left Heart Cath and Coronary Angiography;  Surgeon: Victory LELON Sharps, MD; LAD 60%, oD1 90%, pD1 70%, D2 70%, CFX patent stent, 30% distal to prev stent, pRCA 20%, OM1 90/95%, NL LV   CARDIAC CATHETERIZATION N/A 05/01/2015   Procedure: Coronary Stent Intervention;  Surgeon: Victory LELON Sharps, MD;  Unsuccessful PCI OM due to tortuosity   CARDIAC CATHETERIZATION N/A 11/06/2014   Procedure: Left Heart Cath and Coronary Angiography;  Surgeon: Victory LELON Sharps, MD;  Location: [facility] INVASIVE CV LAB;  Service: Cardiovascular;  Laterality: N/A;   CLOSED REDUCTION SHOULDER DISLOCATION Right ~ 1975   & reattached muscle   COLONOSCOPY W/ BIOPSIES AND POLYPECTOMY  X 2   ESOPHAGOGASTRODUODENOSCOPY ENDOSCOPY     KNEE ARTHROSCOPY WITH MEDIAL MENISECTOMY Left 09/14/2018   Procedure: LEFT KNEE ARTHROSCOPY CHONDROPLASY,  WITH MEDIAL MENISECTOMY;  Surgeon: Josefina Chew, MD;  Location: [facility];  Service: Orthopedics;  Laterality: Left;   KNEE SURGERY Left 11/17/2021   PILONIDAL CYST EXCISION     SHOULDER ARTHROSCOPY WITH DEBRIDEMENT AND BICEP TENDON REPAIR Right 01/18/2019   Procedure: RIGHT SHOULDER ARTHROSCOPY WITH  DEBRIDEMENT AND DECOMPRSSION SUBACROMIAL PARTIAL ACROMIOPLASTY;  Surgeon: Josefina Chew, MD;  Location: [facility];  Service: Orthopedics;  Laterality: Right;   SHOULDER ARTHROSCOPY WITH ROTATOR CUFF REPAIR Right 01/18/2019   Procedure: SHOULDER ARTHROSCOPY WITH ROTATOR CUFF REPAIR;  Surgeon: Josefina Chew, MD;  Location: [facility];  Service: Orthopedics;  Laterality: Right;   Social History   Socioeconomic History   Marital status: Married    Spouse name: Suzen   Number of children: 4   Years of education: Not on file   Highest education level: High school graduate  Occupational History   Occupation: Retired  Comment: Former- [employer]ffic  Tobacco Use   Smoking status: Former    Current packs/day: 0.00    Average packs/day: 0.5 packs/day for 10.0 years (5.0 ttl pk-yrs)    Types: Cigarettes    Start date: 04/05/1973    Quit date: 04/06/1983    Years since quitting: 41.0   Smokeless tobacco: Never  Vaping Use   Vaping status: Never Used  Substance and Sexual Activity   Alcohol use: Yes    Alcohol/week: 0.0 standard drinks of alcohol    Comment: `/26/2017 might drink a beer q couple months mostly; summertime I might drink 2-3 beers/week   Drug use: No   Sexual activity: Not Currently  Other Topics Concern   Not on file  Social History Narrative   Lives in [place] with spouse Cephas Revard).  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   Social Drivers of Health   Tobacco Use: Low Risk (12/21/2023)   Received from [facility]   Patient History    Smoking Tobacco Use: Never    Smokeless Tobacco Use: Never    Passive Exposure: Never  Recent Concern: Tobacco Use - Medium Risk (12/09/2023)   Patient History    Smoking Tobacco Use: Former    Smokeless Tobacco Use: Never    Passive Exposure: Not on file  Financial Resource Strain: Low Risk (06/17/2023)   Received  from [facility]   Overall Financial Resource Strain (CARDIA)    Difficulty of Paying Living Expenses: Not hard at all  Food Insecurity: No Food Insecurity (06/17/2023)   Received from [facility]   Epic    Within the past 12 months, you worried that your food would run out before you got the money to buy more.: Never true    Within the past 12 months, the food you bought just didn't last and you didn't have money to get more.: Never true  Transportation Needs: No Transportation Needs (06/17/2023)   Received from [facility] - Transportation    Lack of Transportation (Medical): No    Lack of Transportation (Non-Medical): No  Physical Activity: Not on file  Stress: Not on file  Social Connections: Not on file  Intimate Partner Violence: Not on file  Depression (PHQ2-9): Medium Risk (10/14/2022)   Depression (PHQ2-9)    PHQ-2 Score: 7  Alcohol Screen: Not on file  Housing: Low Risk (06/17/2023)   Received from [facility]    In the last 12 months, was there a time when you were not able to pay the mortgage or rent on time?: No    In the past 12 months, how many times have you moved where you were living?: 1    At any time in the past 12 months, were you homeless or living in a shelter (including now)?: No  Utilities: Not At Risk (06/17/2023)   Received from [facility] Utilities    Threatened with loss of utilities: No  Health Literacy: Not on file   Current Outpatient Medications on File Prior to Visit  Medication Sig Dispense Refill   albuterol  (VENTOLIN  HFA) 108 (90 Base) MCG/ACT inhaler INHALE 1-2 PUFFS INTO THE LUNGS EVERY 4 HOURS AS NEEDED FOR WHEEZING OR SHORTNESS OF BREATH. 18 each 2   aspirin  EC 81 MG tablet Take 81 mg by mouth daily. Swallow whole.     Blood Glucose Monitoring Suppl (RELION PREMIER  BLU MONITOR) DEVI 1 Device by Does not apply route daily. 1 Device 0   fluticasone  (FLONASE ) 50 MCG/ACT nasal spray Place into the nose.      furosemide  (LASIX ) 20 MG tablet Take 1 tablet (20 mg total) by mouth daily as needed. 10 tablet 0   hydrALAZINE  (APRESOLINE ) 50 MG tablet TAKE 1 TABLET BY MOUTH THREE TIMES A DAY 270 tablet 3   hydrochlorothiazide  (HYDRODIURIL ) 25 MG tablet Take 1 tablet (25 mg total) by mouth daily. 90 tablet 2   losartan  (COZAAR ) 100 MG tablet TAKE 1 TABLET BY MOUTH EVERY DAY 15 tablet 0   metFORMIN  (GLUCOPHAGE ) 500 MG tablet Take 2 tablets (1,000 mg total) by mouth 2 (two) times daily with a meal. 360 tablet 3   metoprolol  tartrate (LOPRESSOR ) 50 MG tablet TAKE 1 TABLET BY MOUTH TWICE A DAY 60 tablet 0   nitroGLYCERIN  (NITROSTAT ) 0.4 MG SL tablet PLACE 1 TABLET UNDER THE TONGUE EVERY 5 MIN X 3 DOSES AS NEEDED FOR CHEST PAIN 75 tablet 2   pravastatin  (PRAVACHOL ) 40 MG tablet Take 40 mg by mouth daily.     tamsulosin  (FLOMAX ) 0.4 MG CAPS capsule Take 0.4 mg by mouth daily.     tirzepatide  (MOUNJARO ) 5 MG/0.5ML Pen INJECT 5 MG SUBCUTANEOUSLY WEEKLY 6 mL 1   tiZANidine  (ZANAFLEX ) 4 MG tablet Take 1 tablet (4 mg total) by mouth 2 (two) times daily as needed for muscle spasms. 20 tablet 0   Vitamin D , Ergocalciferol , (DRISDOL ) 1.25 MG (50000 UNIT) CAPS capsule TAKE 1 CAPSULE (50,000 UNITS TOTAL) BY MOUTH EVERY 7 (SEVEN) DAYS 12 capsule 1   No current facility-administered medications on file prior to visit.   Allergies  Allergen Reactions   Statins Other (See Comments)    REACTION: joint pain Lipitor- headaches Has also tried Livalo , pravachol , zetia , crestor, welchol    Imdur  [Isosorbide  Dinitrate]     Headache - violent   Repatha  [Evolocumab ]     Muscle weakness and soreness   Family History  Problem Relation Age of Onset   Coronary artery disease Other        CABG   Coronary artery disease Mother    Stroke Mother    Heart disease Mother    Diabetes Mother    Other Father    Diabetes Sister    Diabetes Maternal Uncle        x2   Alcohol abuse Other    Diabetes Paternal Uncle        x2   Colon  cancer Paternal Uncle    Colon polyps Neg Hx    Esophageal cancer Neg Hx    PE: BP (!) 170/64   Pulse 74   Resp 18   Ht 5' 9 (1.753 m)   Wt 239 lb (108.4 kg)   SpO2 95%   BMI 35.29 kg/m he mentions that he was rushing over here and that is why the blood pressure is high today Wt Readings from Last 3 Encounters:  04/10/24 239 lb (108.4 kg)  12/09/23 236 lb 11.2 oz (107.4 kg)  10/06/23 240 lb 12.8 oz (109.2 kg)   Constitutional: overweight, in NAD Eyes: EOMI, no exophthalmos ENT: no thyromegaly, no cervical lymphadenopathy Cardiovascular: RRR, No MRG, + significant B pitting LE edema Respiratory: CTA B Musculoskeletal: no deformities Skin: + stasis dermatitis Neurological: no tremor with outstretched hands  ASSESSMENT: 1. DM2, prev. insulin -dependent, uncontrolled, with complications - CAD, s/p NSTEMI, s/p stent 11/2014 - ED - mild CKD  2. Obesity  class II  3. HL  PLAN:  1. Patient with history of uncontrolled type 2 diabetes, with improved control lately and an HbA1c of 6.0% at last visit, decreased from 6.3%.  At that time, sugars were in the lower target range while on metformin , Mounjaro , and glipizide .  With stopped glipizide  due to the excellent blood sugars. -At today's visit, he mentions he is not checking blood sugars as his meter is defective.  I called in a prescription for the Accu-Chek guide meter with supplies to his pharmacy and advised him to restart checking once a day. - I suggested to:  Patient Instructions  Please continue: - Metformin  1000 mg 2x a day - Mounjaro  5 mg weekly  Restart checking blood sugars 1x a day.  Do not wear the compression socks at next visit.  Please come back for a follow-up appointment in 4 months.  - we checked his HbA1c: 6.4% (higher) - advised to check sugars at different times of the day - 1x a day, rotating check times - advised for yearly eye exams >> he is UTD - he declines a foot exam at today's visit due to  wearing compression hoses and having difficulty pulling them back on.  I advised him to not wear them at next visit so we can check the foot exam. - return to clinic in 4 months  2. Obesity class 1 - He was on Ozempic  at last visit but afterwards was he contacted me wanting to switch to Mounjaro . A PA was needed for this. - He previously inquired about Wegovy  but I explained that this is the same substance in a different formulation for people that do not have diabetes - Before last visit he only lost 1 pound but fluid retention in his legs could have contributed to the slowing down of his weight loss - Since last visit, he lost 1 pound  3. HL - Latest lipid panel showed LDL above target, otherwise fractions at goal: Lab Results  Component Value Date   CHOL 177 06/01/2022   HDL 42.70 06/01/2022   LDLCALC 111 (H) 06/01/2022   LDLDIRECT 131 (H) 03/22/2019   TRIG 120.0 06/01/2022   CHOLHDL 4 06/01/2022  -He could not previously tolerate statins or Repatha  due to muscle weakness but was considering going back to the PCSK9 inhibitor. He was not on Repatha  at last visit was planning to discuss with his cardiologist about this.  However, his medicines were not changed since last visit. - He is due for another lipid panel.  He has an appointment with PCP tomorrow.  Lela Fendt, MD PhD [facility] Endocrinology

## 2024-04-13 ENCOUNTER — Ambulatory Visit: Admitting: Family Medicine

## 2024-04-16 ENCOUNTER — Ambulatory Visit: Admitting: Family Medicine

## 2024-04-17 ENCOUNTER — Ambulatory Visit (INDEPENDENT_AMBULATORY_CARE_PROVIDER_SITE_OTHER): Admitting: Family Medicine

## 2024-04-17 VITALS — BP 140/70 | HR 90 | Temp 97.7°F | Ht 69.0 in | Wt 235.1 lb

## 2024-04-17 DIAGNOSIS — H903 Sensorineural hearing loss, bilateral: Secondary | ICD-10-CM

## 2024-04-17 DIAGNOSIS — R4189 Other symptoms and signs involving cognitive functions and awareness: Secondary | ICD-10-CM | POA: Diagnosis not present

## 2024-04-17 DIAGNOSIS — F332 Major depressive disorder, recurrent severe without psychotic features: Secondary | ICD-10-CM

## 2024-04-17 DIAGNOSIS — I10 Essential (primary) hypertension: Secondary | ICD-10-CM | POA: Diagnosis not present

## 2024-04-17 DIAGNOSIS — G4733 Obstructive sleep apnea (adult) (pediatric): Secondary | ICD-10-CM | POA: Diagnosis not present

## 2024-04-17 DIAGNOSIS — R7989 Other specified abnormal findings of blood chemistry: Secondary | ICD-10-CM | POA: Diagnosis not present

## 2024-04-17 LAB — VITAMIN B12: Vitamin B-12: 342 pg/mL (ref 211–911)

## 2024-04-17 LAB — VITAMIN D 25 HYDROXY (VIT D DEFICIENCY, FRACTURES): VITD: 34.38 ng/mL (ref 30.00–100.00)

## 2024-04-17 NOTE — Patient Instructions (Addendum)
 Please let me know which company is supplying your BiPAP machine so we can contact them with new orders.  Also please look at your medicine bottles at home and compare them to our list. Make sure you have refills of all of your blood pressure medications.

## 2024-04-17 NOTE — Progress Notes (Unsigned)
 "  Established Patient Office Visit  Subjective   Patient ID: [name], male    DOB: 03-13-1951  Age: 74 y.o. MRN: [id number]  Chief Complaint  Patient presents with   Memory Loss    X2 months or more   Hallucinations    X2 months    HPI Discussed the use of AI scribe software for clinical note transcription with the patient, who gave verbal consent to proceed.  History of Present Illness   [name] is a 74 year old male with diabetes and prostate cancer who presents with memory issues and depressive symptoms.  He reports progressive memory problems with episodes of confusion. He has trouble recognizing familiar intersections and places, sometimes believes his car has been tampered with, and often cannot recall recent events. He scored 14 on a dementia screening test.  He feels depressed with markedly reduced interest in previous hobbies, though he still enjoys some activities. He feels down or hopeless nearly every day. He has severe depression in the past. He denies thoughts of self-harm or feeling better off dead.  He has chronic insomnia with difficulty falling asleep at appropriate times and often sleeps only a few hours at night with daytime naps. He uses medication for sleep. His CPAP machine is broken and he is not using it. He feels tired from poor sleep.  He has lost a few pounds but maintains regular meals. He has trouble concentrating and feels fidgety and restless daily. He feels nervous and anxious most days, struggles to control worrying, has difficulty relaxing, especially when alone, and sometimes fears something awful might happen.  He has prostate cancer treated with radiation seeds and has frequent urination. He recalls a prior urinary tract infection that caused weakness and personality changes but does not feel he has an infection now.  His medications include losartan  100 mg daily, metoprolol  50 mg twice daily, hydrochlorothiazide  25 mg daily, and  hydralazine . He is unsure who is currently prescribing these medications.       Current Outpatient Medications  Medication Instructions   Accu-Chek Softclix Lancets lancets Use as instructed 1x a day   albuterol  (VENTOLIN  HFA) 108 (90 Base) MCG/ACT inhaler INHALE 1-2 PUFFS INTO THE LUNGS EVERY 4 HOURS AS NEEDED FOR WHEEZING OR SHORTNESS OF BREATH.   aspirin  EC 81 mg, Daily   Blood Glucose Monitoring Suppl (ACCU-CHEK GUIDE) w/Device KIT Use as advised   fluticasone  (FLONASE ) 50 MCG/ACT nasal spray Place into the nose.   furosemide  (LASIX ) 20 mg, Oral, Daily PRN   glucose blood test strip Use as instructed 1x a day   hydrALAZINE  (APRESOLINE ) 50 mg, Oral, 3 times daily   hydrochlorothiazide  (HYDRODIURIL ) 25 mg, Oral, Daily   losartan  (COZAAR ) 100 mg, Oral, Daily   metFORMIN  (GLUCOPHAGE ) 1,000 mg, Oral, 2 times daily with meals   metoprolol  tartrate (LOPRESSOR ) 50 mg, Oral, 2 times daily   nitroGLYCERIN  (NITROSTAT ) 0.4 MG SL tablet PLACE 1 TABLET UNDER THE TONGUE EVERY 5 MIN X 3 DOSES AS NEEDED FOR CHEST PAIN   pravastatin  (PRAVACHOL ) 40 mg, Daily   tamsulosin  (FLOMAX ) 0.4 mg, Daily   tirzepatide  (MOUNJARO ) 5 MG/0.5ML Pen INJECT 5 MG SUBCUTANEOUSLY WEEKLY   tiZANidine  (ZANAFLEX ) 4 mg, Oral, 2 times daily PRN   Vitamin D  (Ergocalciferol ) (DRISDOL ) 50,000 Units, Oral, Every 7 days    Patient Active Problem List   Diagnosis Date Noted   Morbid (severe) obesity due to excess calories (HCC) 04/17/2024   Personal history of prostate cancer  12/10/2021   Urge incontinence 12/10/2021   Traumatic rupture of left quadriceps tendon 09/30/2021   Acute non-ST elevation myocardial infarction (NSTEMI) (HCC) 05/04/2019   Statin myopathy 04/04/2019   Rupture of right supraspinatus tendon 01/18/2019   Complex tear of medial meniscus of left knee 09/14/2018   Refusal of blood transfusions as patient is Jehovah's Witness    Osteoarthritis    GERD (gastroesophageal reflux disease)    Depression     Chronic lower back pain    Anemia    Essential hypertension 01/03/2017   PAT (paroxysmal atrial tachycardia) 10/27/2015   Type 2 diabetes mellitus with circulatory disorder, without long-term current use of insulin  (HCC) 06/19/2015   Angina pectoris    CAD (coronary artery disease)    Obstructive sleep apnea 09/06/2014   Prostatitis, acute 12/13/2013   Insomnia 04/19/2013   Obesity with body mass index of 30.0-39.9 10/18/2012   Right lumbar radiculitis 09/29/2011   Adenomatous colon polyp 04/05/2009   Erectile dysfunction 11/11/2008   Hyperlipidemia LDL goal <70 08/23/2008   Benign prostatic hyperplasia with urinary obstruction 08/23/2008     Review of Systems  All other systems reviewed and are negative.     Objective:     BP (!) 140/70   Pulse 90   Temp 97.7 F (36.5 C) (Oral)   Ht 5' 9 (1.753 m)   Wt 235 lb 1.6 oz (106.6 kg)   SpO2 98%   BMI 34.72 kg/m    Physical Exam Vitals reviewed.  Constitutional:      Appearance: Normal appearance. He is well-groomed and normal weight.  Eyes:     Extraocular Movements: Extraocular movements intact.     Conjunctiva/sclera: Conjunctivae normal.  Neck:     Thyroid : No thyromegaly.  Cardiovascular:     Rate and Rhythm: Normal rate and regular rhythm.     Heart sounds: S1 normal and S2 normal. No murmur heard. Pulmonary:     Effort: Pulmonary effort is normal.     Breath sounds: Normal breath sounds and air entry. No rales.  Abdominal:     General: Abdomen is flat. Bowel sounds are normal.  Musculoskeletal:     Right lower leg: No edema.     Left lower leg: No edema.  Neurological:     General: No focal deficit present.     Mental Status: He is alert and oriented to person, place, and time.     Gait: Gait is intact.  Psychiatric:        Mood and Affect: Mood and affect normal.       04/17/2024    1:18 PM 12/31/2019   11:05 AM 12/28/2019   11:35 AM  6CIT Screen  What Year? 0 points 0 points 0 points  What  month? 0 points 0 points 0 points  What time? 0 points 0 points 0 points  Count back from 20 0 points 0 points 0 points  Months in reverse 4 points 0 points 0 points  Repeat phrase 10 points 0 points 4 points  Total Score 14 points 0 points 4 points      Results for orders placed or performed in visit on 04/17/24  VITAMIN D  25 Hydroxy (Vit-D Deficiency, Fractures)  Result Value Ref Range   VITD 34.38 30.00 - 100.00 ng/mL  Vitamin B12  Result Value Ref Range   Vitamin B-12 342 211 - 911 pg/mL      The ASCVD Risk score (Arnett DK, et al., 2019) failed to calculate  for the following reasons:   Risk score cannot be calculated because patient has a medical history suggesting prior/existing ASCVD   * - Cholesterol units were assumed    Assessment & Plan:  Sensorineural hearing loss (SNHL) of both ears -     Ambulatory referral to Audiology  Cognitive impairment -     Vitamin B12; Future -     MR BRAIN W WO CONTRAST; Future -     AMB Referral VBCI Care Management  Low vitamin D  level -     VITAMIN D  25 Hydroxy (Vit-D Deficiency, Fractures); Future  Morbid (severe) obesity due to excess calories (HCC)  Essential hypertension -     AMB Referral VBCI Care Management  Severe episode of recurrent major depressive disorder, without psychotic features (HCC)  Obstructive sleep apnea -     For home use only DME Other see comment   Assessment and Plan    Cognitive impairment Mild cognitive impairment with memory difficulties, including trouble recognizing familiar places and recalling addresses. Differential includes depression-related memory issues versus primary cognitive decline. MOCA score of 14 indicates mild impairment. Hearing loss may contribute to cognitive symptoms. - Ordered MRI of the brain to assess for structural causes of cognitive impairment - Referred to audiologist for hearing evaluation and potential hearing aid fitting - Checked vitamin B12 and vitamin D   levels  Major depressive disorder, recurrent severe without psychotic features Symptoms include anhedonia, hopelessness, and irritability. Depression may contribute to cognitive symptoms. Previous treatment with sertraline  was not well-tolerated. Current symptoms do not align with past severe depressive episodes. - Discussed potential for depression medication if symptoms worsen  Sensorineural hearing loss, bilateral Significant hearing loss affecting communication and potentially contributing to cognitive symptoms. No current use of hearing aids. - Referred to audiologist for hearing evaluation and potential hearing aid fitting  Obstructive sleep apnea Non-compliance due to broken CPAP machine. Symptoms include daytime fatigue and memory issues. Previous BiPAP settings were 18/14. - Ordered new BiPAP machine with settings 18/14 - Instructed to check CPAP machine settings and company information for repair or replacement  Essential hypertension Blood pressure elevated at 140/70. Current medications include losartan , metoprolol , hydrochlorothiazide , and hydralazine . Uncertainty about medication adherence and prescription management. - Contacted pharmacy to verify current medications and prescription status - Will consider adjusting antihypertensive regimen based on medication review  Low vitamin D  level Previous low vitamin D  level noted. Potential contributor to cognitive and mood symptoms. - Checked vitamin D  level - Will consider vitamin D  supplementation if levels remain low     I called and spoke to the pharmacy and he is only taking vitamin D , losartan , nitroglycerin  and mounjaro . All of the other medications are either on hold or they have expired. This explains his elevated blood pressures. I will place a referral to our care team to follow up on this.   I personally spent a total of 98 minutes in the care of the patient today including getting/reviewing separately obtained history,  performing a medically appropriate exam/evaluation, counseling and educating, placing orders, documenting clinical information in the EHR, and coordinating care.  Return in about 3 months (around 07/16/2024).    Heron CHRISTELLA Sharper, MD "

## 2024-04-18 ENCOUNTER — Ambulatory Visit: Payer: Self-pay | Admitting: Family Medicine

## 2024-04-20 ENCOUNTER — Telehealth: Payer: Self-pay | Admitting: *Deleted

## 2024-04-20 NOTE — Progress Notes (Signed)
 Care Guide Pharmacy Note  04/20/2024 Name: [name] MRN: [id number] DOB: 05-31-1950  Referred By: Ozell Heron HERO, MD Reason for referral: Call Attempt #1 and Complex Care Management (Outreach to schedule referral with pharmacist )   [name] is a 74 y.o. year old male who is a primary care patient of Ozell Heron HERO, MD.  [name] was referred to the pharmacist for assistance related to: HTN  Successful contact was made with the patient to discuss pharmacy services including being ready for the pharmacist to call at least 5 minutes before the scheduled appointment time and to have medication bottles and any blood pressure readings ready for review. The patient agreed to meet with the pharmacist via telephone visit on 04/25/2024  [name], CMA, AAMA [place]  [facility], [facility] Guide, Lead Direct Dial: [phone]  Fax: [phone]

## 2024-04-24 ENCOUNTER — Ambulatory Visit: Attending: Family Medicine | Admitting: Audiology

## 2024-04-24 ENCOUNTER — Telehealth: Payer: Self-pay | Admitting: *Deleted

## 2024-04-24 DIAGNOSIS — H903 Sensorineural hearing loss, bilateral: Secondary | ICD-10-CM | POA: Insufficient documentation

## 2024-04-24 NOTE — Progress Notes (Unsigned)
 Complex Care Management Care Guide Note  04/24/2024 Name: [name] MRN: [id number] DOB: November 24, 1950  [name] is a 74 y.o. year old male who is a primary care patient of Ozell Heron HERO, MD and is actively engaged with the care management team. I reached out to [name] by phone today to assist with re-scheduling  with the Pharmacist.  Follow up plan: Unsuccessful telephone outreach attempt made. A HIPAA compliant phone message was left for the patient providing contact information and requesting a return call.  [name], CMA, AAMA [place]  [facility], [facility] Guide, Lead Direct Dial: [phone]  Fax: [phone]

## 2024-04-25 ENCOUNTER — Other Ambulatory Visit

## 2024-04-26 ENCOUNTER — Ambulatory Visit: Admitting: Audiologist

## 2024-04-26 ENCOUNTER — Encounter: Payer: Self-pay | Admitting: Audiologist

## 2024-04-26 DIAGNOSIS — H903 Sensorineural hearing loss, bilateral: Secondary | ICD-10-CM

## 2024-04-26 NOTE — Progress Notes (Signed)
 Complex Care Management Care Guide Note  04/26/2024 Name: [name] MRN: [id number] DOB: 06/24/1950  [name] is a 74 y.o. year old male who is a primary care patient of Ozell Heron HERO, MD and is actively engaged with the care management team. I reached out to [name] by phone today to assist with re-scheduling  with the Pharmacist.  Follow up plan: Unsuccessful telephone outreach attempt made. A HIPAA compliant phone message was left for the patient providing contact information and requesting a return call. No further outreach attempts will be made due to inability to maintain patient contact.   [name], CMA, AAMA [place]  [facility], [facility] Guide, Lead Direct Dial: [phone]  Fax: [phone]

## 2024-04-26 NOTE — Procedures (Signed)
" °  Outpatient Audiology and [facility] [address] [place], [place]  [address] [phone]  AUDIOLOGICAL  EVALUATION  NAME: [name]     DOB:   06-09-1950      MRN: [id number]                                                                                     DATE: 04/26/2024     REFERENT: Ozell Heron HERO, MD STATUS: Outpatient DIAGNOSIS: Severe Sloping Sensorineural Hearing Loss    History: [name] was seen for an audiological evaluation due to difficulty hearing people clearly for decades. He knows he is not hearing well. He cannot hear people in a mask. He us  unable to hear his doctor.  [name] denies pain, pressure, or tinnitus.  [name] has significant history of occupational hazardous noise exposure. He did not wear hearing protection and is 'paying for it now' Medical history shows cognitive impairment which is risk for hearing loss.    Evaluation:  Otoscopy showed a clear view of the tympanic membranes, bilaterally Tympanometry results were consistent with normal middle ear function, bilaterally   Audiometric testing was completed using Conventional Audiometry techniques with insert earphones and supraural headphones. Test results are consistent with normal hearing sloping steeply to severe sensorineural hearing loss bilaterally. Speech Recognition Thresholds were obtained at  35dB HL in the right ear and at  30dB HL in the left ear. Word Recognition Testing was completed at  40dB SL and [name] scored 100% in the right ear and 88% in the left ear.    Results:  The test results were reviewed with [name]. He has severe high frequency sensorineural hearing loss. He cannot hear any high pitched consonants. He has to lipread to understand people. He will not be able to communicate with people wearing masks. He needs hearing aids for both ears. Audiogram printed and provided to [name].    Recommendations: Hearing aids recommended for both ears. Patient given list of  local hearing aid providers.  Annual audiometric testing recommended to monitor hearing loss for progression.   30 minutes spent testing and counseling on results.   If you have any questions please feel free to contact me at [phone].  Lauraine Ka Stalnaker Au.D.  Audiologist   04/26/2024  4:33 PM  Cc: Ozell Heron HERO, MD  "

## 2024-05-07 ENCOUNTER — Other Ambulatory Visit: Payer: Self-pay | Admitting: Cardiology

## 2024-05-08 NOTE — Telephone Encounter (Signed)
 Reached patient! He was not able to talk right now but I have him on my schedule for Thursday to review meds and make sure he has everything he needs!

## 2024-05-08 NOTE — Telephone Encounter (Signed)
 Looks like he is needing some attention with his meds-- I had called the pharmacy and found out there were multiple medications that he hasn't been filling-- ok to call in anything that he needs!

## 2024-05-08 NOTE — Telephone Encounter (Signed)
 You are the best!

## 2024-05-10 ENCOUNTER — Other Ambulatory Visit: Payer: Self-pay | Admitting: Family Medicine

## 2024-05-10 ENCOUNTER — Other Ambulatory Visit

## 2024-05-10 DIAGNOSIS — E785 Hyperlipidemia, unspecified: Secondary | ICD-10-CM

## 2024-05-10 DIAGNOSIS — E1159 Type 2 diabetes mellitus with other circulatory complications: Secondary | ICD-10-CM

## 2024-05-10 DIAGNOSIS — I10 Essential (primary) hypertension: Secondary | ICD-10-CM

## 2024-05-10 MED ORDER — TAMSULOSIN HCL 0.4 MG PO CAPS: 0.4000 mg | ORAL_CAPSULE | Freq: Every day | ORAL | 0 refills | Status: AC

## 2024-05-10 NOTE — Progress Notes (Signed)
 "  05/10/2024 Name: [name] MRN: [id number] DOB: [birth year]  Chief Complaint  Patient presents with   Medication Management   Hypertension   Diabetes   Medication Assistance    [name] is a 74 y.o. year old male who presented for a telephone visit.   They were referred to the pharmacist by their PCP for assistance in managing diabetes, hypertension, hyperlipidemia/cardiovascular risk reduction, medication access, and complex medication management.    Subjective:  Care Team: Primary Care Provider: Ozell Heron HERO, MD ; Next Scheduled Visit: 07/16/24  Medication Access/Adherence  Current Pharmacy:  [facility] [phone] [name], [place] - [address] [place] [name] [address] Phone: [phone] Fax: [phone]   Patient reports affordability concerns with their medications: Yes  - Mounjaro  Patient reports access/transportation concerns to their pharmacy: No  Patient reports adherence concerns with their medications:  Yes  - taking pravastatin , metoprolol , and hydralazine  differently than prescribed. Several of his meds show they haven't been picked up at pharmacy in several months but he still has them on hand   Diabetes:  Current medications: Mounjaro  5mg  (last dose given last week), Metformin  1000mg  BID (only taking 1000mg  once daily) Medications tried in the past: Glipizide , Trulicity , Ozempic   Current glucose readings: Tries to check once daily, unable to give specific readings but reports seeing mostly good numbers   Observed patterns:  Patient denies hypoglycemic s/sx including dizziness, shakiness, sweating. Patient denies hyperglycemic symptoms including polyuria, polydipsia, polyphagia, nocturia, neuropathy, blurred vision.  Reports he is due for a Mounjaro  dose this week but $$$ at pharmacy, reports he uses his wife's insurance and they plan to call them this week to discuss the price and see what the options  are  Current medication access support: None  Macrovascular and Microvascular Risk Reduction:  Statin? yes (pravastatin  but only taking once weekly, see HLD section for more details); ACEi/ARB? yes (losartan  100mg ) Last urinary albumin/creatinine ratio:  Lab Results  Component Value Date   MICRALBCREAT 4.8 12/09/2023   MICRALBCREAT 2 04/07/2023   MICRALBCREAT 3.1 08/23/2008   Last eye exam:  Lab Results  Component Value Date   HMDIABEYEEXA No Retinopathy 02/29/2024   Last foot exam: 10/06/2023 Tobacco Use:  Tobacco Use: Medium Risk (04/10/2024)   Patient History    Smoking Tobacco Use: Former    Smokeless Tobacco Use: Never    Passive Exposure: Not on Brock   Hypertension:  Current medications: Losartan  100mg , Hydralazine  50mg  1 tab three times daily (ONLY TAKING ONCE DAILY), Metoprolol  tartrate 50mg  BID (ONLY TAKING 1 TAB ONCE DAILY) Medications previously tried: Amlodipine , Bystolic   Patient has a validated, automated, upper arm home BP cuff Current blood pressure readings readings: not checking  Patient denies hypotensive s/sx including dizziness, lightheadedness.  Patient denies hypertensive symptoms including headache, chest pain, shortness of breath  Reports he has now established cardio care with Dr. Uvaldo at [facility] in [place]. Last seen 12/21/23, next follow up in Sept of this year  [name] note mentions patient being on hydralazine  100mg  BID but patient reports only having 50mg  at home   Hyperlipidemia/ASCVD Risk Reduction  Current lipid lowering medications: Pravastatin  once weekly (instead of daily) Medications tried in the past: MYOPATHY to statins, zetia  and repatha   Antiplatelet regimen: Aspirin  81mg  daily   Objective:  Lab Results  Component Value Date   HGBA1C 6.4 (A) 04/10/2024    Lab Results  Component Value Date   CREATININE 1.17 12/09/2023   BUN  24 (H) 12/09/2023   NA 142 12/09/2023   K 3.9 12/09/2023   CL 104 12/09/2023    CO2 29 12/09/2023    Lab Results  Component Value Date   CHOL 177 06/01/2022   HDL 42.70 06/01/2022   LDLCALC 111 (H) 06/01/2022   LDLDIRECT 131 (H) 03/22/2019   TRIG 120.0 06/01/2022   CHOLHDL 4 06/01/2022    Medications Reviewed Today     Reviewed by [name], [name] (Pharmacist) on 05/10/24 at 1508  Med List Status: <None>   Medication Order Taking? Sig Documenting Provider Last Dose Status Informant  Accu-Chek Softclix Lancets lancets [id number]  Use as instructed 1x a day Ronald File, MD  Active     Discontinued 05/10/24 1504 (Completed Course)   aspirin  EC 81 MG tablet [id number] Yes Take 81 mg by mouth daily. Swallow whole. [provider]  Active   Blood Glucose Monitoring Suppl (ACCU-CHEK GUIDE) w/Device KIT [id number] Yes Use as advised Ronald File, MD  Active   fluticasone  (FLONASE ) 50 MCG/ACT nasal spray [id number] Yes Place into the nose. [provider]  Active     Discontinued 05/10/24 1505 (Completed Course)   glucose blood test strip [id number] Yes Use as instructed 1x a day Ronald File, MD  Active   hydrALAZINE  (APRESOLINE ) 50 MG tablet [id number] Yes TAKE 1 TABLET BY MOUTH THREE TIMES A DAY  Patient taking differently: Take 50 mg by mouth 3 (three) times daily. Taking once daily   Ronald Victory ORN, MD  Active   hydrochlorothiazide  (HYDRODIURIL ) 25 MG tablet [id number] Yes Take 1 tablet (25 mg total) by mouth daily. Ronald Oneil BROCKS, MD  Active   losartan  (COZAAR ) 100 MG tablet [id number] Yes Take 1 tablet (100 mg total) by mouth daily. Pt.needs an appointment in order to receive future refills. Thank You. 3rd,final Attempt. Ronald Oneil BROCKS, MD  Active   metFORMIN  (GLUCOPHAGE ) 500 MG tablet [id number] Yes Take 2 tablets (1,000 mg total) by mouth 2 (two) times daily with a meal.  Patient taking differently: Take 1,000 mg by mouth 2 (two) times daily with a meal. 2 tabs once daily   Ronald File, MD  Active   metoprolol  tartrate  (LOPRESSOR ) 50 MG tablet [id number] Yes TAKE 1 TABLET BY MOUTH TWICE A DAY  Patient taking differently: Take 50 mg by mouth 2 (two) times daily. 1 tab once daily   Ronald Oneil BROCKS, MD  Active   nitroGLYCERIN  (NITROSTAT ) 0.4 MG SL tablet [id number]  PLACE 1 TABLET UNDER THE TONGUE EVERY 5 MIN X 3 DOSES AS NEEDED FOR CHEST PAIN Ronald Victory ORN, MD  Active   pravastatin  (PRAVACHOL ) 40 MG tablet [id number] Yes Take 40 mg by mouth daily.  Patient taking differently: Take 40 mg by mouth daily. 1 tablet once weekly   [provider]  Active   tamsulosin  (FLOMAX ) 0.4 MG CAPS capsule [id number]  Take 0.4 mg by mouth daily.  Patient not taking: Reported on 05/10/2024   [provider]  Active   tirzepatide  (MOUNJARO ) 5 MG/0.5ML Pen [id number] Yes INJECT 5 MG SUBCUTANEOUSLY WEEKLY Ronald File, MD  Active     Discontinued 05/10/24 1507 (Completed Course)   Vitamin D , Ergocalciferol , (DRISDOL ) 1.25 MG (50000 UNIT) CAPS capsule [id number] Yes TAKE 1 CAPSULE (50,000 UNITS TOTAL) BY MOUTH EVERY 7 (SEVEN) DAYS Ronald Heron HERO, MD  Active               Assessment/Plan:   Diabetes: - Currently controlled; goal A1c <7%.  Cardiorenal risk reduction has opportunities for improvement.. Blood pressure is not at goal <130/80. LDL is not at goal.  - Reviewed long term cardiovascular and renal outcomes of uncontrolled blood sugar. and Reviewed goal A1c, goal fasting, and goal 2 hour post prandial glucose. Recommended to check glucose daily -Continue current med therapy, managed by Dr. Trixie. Advised to call insurance ASAP regarding mounjaro  cost. If mounjaro  remains $$$, may need to coordinate med changes with Dr. Trixie, patient expresses he may want to go back on glipizide  if this happens  Hypertension: - Currently uncontrolled - Reviewed long term cardiovascular and renal outcomes of uncontrolled blood pressure - Reviewed appropriate blood pressure monitoring technique and reviewed goal  blood pressure. Recommended to check home blood pressure and heart rate DAILY and keep a log - Recommend to TAKE ALL MEDS AS PRESCRIBED on current med list. If BP issues persist, may need to coordinate sooner f/u with cardio     Hyperlipidemia/ASCVD Risk Reduction: - Currently uncontrolled.  - Reviewed long term complications of uncontrolled cholesterol -Patient plans to continue taking pravastatin  once weekly. Recommend updated lipid panel at next PCP visit. As patient has hx of intolerance to repatha  and statins, may want to consider referral to lipid clinic in future for possible use of leqvio  Refill Needs: out of tamsulosin , will coordinate rx  Follow Up Plan: 1 week  Jon VEAR [place], PharmD Clinical Pharmacist [phone]  "

## 2024-05-15 ENCOUNTER — Other Ambulatory Visit

## 2024-05-16 ENCOUNTER — Other Ambulatory Visit

## 2024-07-16 ENCOUNTER — Ambulatory Visit: Admitting: Family Medicine

## 2024-08-07 ENCOUNTER — Ambulatory Visit: Admitting: Internal Medicine
